# Patient Record
Sex: Male | Born: 1940 | Race: White | Hispanic: No | Smoking: Former smoker
Health system: Southern US, Community
[De-identification: ages and names within clinical notes are randomized; demographics above are authoritative.]

## PROBLEM LIST (undated history)

## (undated) DIAGNOSIS — I10 Essential (primary) hypertension: Secondary | ICD-10-CM

## (undated) DIAGNOSIS — R042 Hemoptysis: Secondary | ICD-10-CM

## (undated) DIAGNOSIS — R0600 Dyspnea, unspecified: Secondary | ICD-10-CM

## (undated) DIAGNOSIS — K219 Gastro-esophageal reflux disease without esophagitis: Secondary | ICD-10-CM

## (undated) DIAGNOSIS — Z8719 Personal history of other diseases of the digestive system: Secondary | ICD-10-CM

## (undated) DIAGNOSIS — C3490 Malignant neoplasm of unspecified part of unspecified bronchus or lung: Secondary | ICD-10-CM

## (undated) DIAGNOSIS — R911 Solitary pulmonary nodule: Secondary | ICD-10-CM

## (undated) DIAGNOSIS — R0902 Hypoxemia: Secondary | ICD-10-CM

## (undated) DIAGNOSIS — T4145XA Adverse effect of unspecified anesthetic, initial encounter: Secondary | ICD-10-CM

## (undated) DIAGNOSIS — T8859XA Other complications of anesthesia, initial encounter: Secondary | ICD-10-CM

## (undated) DIAGNOSIS — C4490 Unspecified malignant neoplasm of skin, unspecified: Secondary | ICD-10-CM

## (undated) DIAGNOSIS — Z9889 Other specified postprocedural states: Secondary | ICD-10-CM

## (undated) DIAGNOSIS — R112 Nausea with vomiting, unspecified: Secondary | ICD-10-CM

## (undated) DIAGNOSIS — C801 Malignant (primary) neoplasm, unspecified: Secondary | ICD-10-CM

## (undated) DIAGNOSIS — J189 Pneumonia, unspecified organism: Secondary | ICD-10-CM

## (undated) DIAGNOSIS — G43109 Migraine with aura, not intractable, without status migrainosus: Secondary | ICD-10-CM

## (undated) DIAGNOSIS — J449 Chronic obstructive pulmonary disease, unspecified: Secondary | ICD-10-CM

## (undated) DIAGNOSIS — A481 Legionnaires' disease: Secondary | ICD-10-CM

## (undated) HISTORY — PX: VASECTOMY: SHX75

## (undated) HISTORY — DX: Migraine with aura, not intractable, without status migrainosus: G43.109

## (undated) HISTORY — PX: TONSILLECTOMY: SUR1361

## (undated) HISTORY — PX: COLONOSCOPY W/ POLYPECTOMY: SHX1380

## (undated) SURGERY — Surgical Case
Anesthesia: *Unknown

---

## 1998-11-15 ENCOUNTER — Other Ambulatory Visit: Admission: RE | Admit: 1998-11-15 | Discharge: 1998-11-15 | Payer: Self-pay | Admitting: Family Medicine

## 1999-02-14 ENCOUNTER — Ambulatory Visit (HOSPITAL_COMMUNITY): Admission: RE | Admit: 1999-02-14 | Discharge: 1999-02-14 | Payer: Self-pay | Admitting: Family Medicine

## 1999-02-14 ENCOUNTER — Encounter: Payer: Self-pay | Admitting: Family Medicine

## 2012-11-27 DIAGNOSIS — A481 Legionnaires' disease: Secondary | ICD-10-CM

## 2012-11-27 HISTORY — DX: Legionnaires' disease: A48.1

## 2013-04-04 ENCOUNTER — Other Ambulatory Visit: Payer: Self-pay

## 2013-04-04 ENCOUNTER — Inpatient Hospital Stay (HOSPITAL_COMMUNITY)
Admission: EM | Admit: 2013-04-04 | Discharge: 2013-04-14 | DRG: 177 | Disposition: A | Discharge status: Home / Self Care | Payer: 59 | Attending: Family Medicine | Admitting: Family Medicine

## 2013-04-04 ENCOUNTER — Inpatient Hospital Stay (HOSPITAL_COMMUNITY): Payer: 59

## 2013-04-04 ENCOUNTER — Encounter (HOSPITAL_COMMUNITY): Payer: Self-pay | Admitting: Emergency Medicine

## 2013-04-04 ENCOUNTER — Emergency Department (HOSPITAL_COMMUNITY): Payer: 59

## 2013-04-04 DIAGNOSIS — E876 Hypokalemia: Secondary | ICD-10-CM

## 2013-04-04 DIAGNOSIS — H109 Unspecified conjunctivitis: Secondary | ICD-10-CM

## 2013-04-04 DIAGNOSIS — I10 Essential (primary) hypertension: Secondary | ICD-10-CM

## 2013-04-04 DIAGNOSIS — N179 Acute kidney failure, unspecified: Secondary | ICD-10-CM

## 2013-04-04 DIAGNOSIS — Z79899 Other long term (current) drug therapy: Secondary | ICD-10-CM

## 2013-04-04 DIAGNOSIS — K219 Gastro-esophageal reflux disease without esophagitis: Secondary | ICD-10-CM | POA: Diagnosis present

## 2013-04-04 DIAGNOSIS — J9600 Acute respiratory failure, unspecified whether with hypoxia or hypercapnia: Secondary | ICD-10-CM

## 2013-04-04 DIAGNOSIS — R0902 Hypoxemia: Secondary | ICD-10-CM

## 2013-04-04 DIAGNOSIS — Z87891 Personal history of nicotine dependence: Secondary | ICD-10-CM

## 2013-04-04 DIAGNOSIS — A481 Legionnaires' disease: Principal | ICD-10-CM | POA: Diagnosis present

## 2013-04-04 DIAGNOSIS — J189 Pneumonia, unspecified organism: Secondary | ICD-10-CM

## 2013-04-04 DIAGNOSIS — D72829 Elevated white blood cell count, unspecified: Secondary | ICD-10-CM

## 2013-04-04 HISTORY — DX: Gastro-esophageal reflux disease without esophagitis: K21.9

## 2013-04-04 HISTORY — DX: Essential (primary) hypertension: I10

## 2013-04-04 LAB — BLOOD GAS, ARTERIAL
Acid-base deficit: 5 mmol/L — ABNORMAL HIGH (ref 0.0–2.0)
Bicarbonate: 17.2 mEq/L — ABNORMAL LOW (ref 20.0–24.0)
Drawn by: 307971
O2 Content: 4.5 L/min
O2 Saturation: 89.4 %
Patient temperature: 98.6
TCO2: 15.5 mmol/L (ref 0–100)
pCO2 arterial: 24.8 mmHg — ABNORMAL LOW (ref 35.0–45.0)
pH, Arterial: 7.455 — ABNORMAL HIGH (ref 7.350–7.450)
pO2, Arterial: 54.8 mmHg — ABNORMAL LOW (ref 80.0–100.0)

## 2013-04-04 LAB — EXPECTORATED SPUTUM ASSESSMENT W GRAM STAIN, RFLX TO RESP C

## 2013-04-04 LAB — COMPREHENSIVE METABOLIC PANEL
ALT: 25 U/L (ref 0–53)
ALT: 29 U/L (ref 0–53)
AST: 50 U/L — ABNORMAL HIGH (ref 0–37)
AST: 59 U/L — ABNORMAL HIGH (ref 0–37)
Albumin: 3.1 g/dL — ABNORMAL LOW (ref 3.5–5.2)
Albumin: 2.6 g/dL — ABNORMAL LOW (ref 3.5–5.2)
Alkaline Phosphatase: 88 U/L (ref 39–117)
Alkaline Phosphatase: 81 U/L (ref 39–117)
BUN: 26 mg/dL — ABNORMAL HIGH (ref 6–23)
BUN: 28 mg/dL — ABNORMAL HIGH (ref 6–23)
CO2: 21 mEq/L (ref 19–32)
CO2: 20 mEq/L (ref 19–32)
Calcium: 8.5 mg/dL (ref 8.4–10.5)
Calcium: 8.1 mg/dL — ABNORMAL LOW (ref 8.4–10.5)
Chloride: 100 mEq/L (ref 96–112)
Chloride: 105 mEq/L (ref 96–112)
Creatinine, Ser: 1.43 mg/dL — ABNORMAL HIGH (ref 0.50–1.35)
Creatinine, Ser: 1.48 mg/dL — ABNORMAL HIGH (ref 0.50–1.35)
GFR calc Af Amer: 55 mL/min — ABNORMAL LOW (ref >90)
GFR calc Af Amer: 53 mL/min — ABNORMAL LOW (ref >90)
GFR calc non Af Amer: 48 mL/min — ABNORMAL LOW (ref >90)
GFR calc non Af Amer: 46 mL/min — ABNORMAL LOW (ref >90)
Glucose, Bld: 138 mg/dL — ABNORMAL HIGH (ref 70–99)
Glucose, Bld: 129 mg/dL — ABNORMAL HIGH (ref 70–99)
Potassium: 3.8 mEq/L (ref 3.5–5.1)
Potassium: 3.2 mEq/L — ABNORMAL LOW (ref 3.5–5.1)
Sodium: 136 mEq/L (ref 135–145)
Sodium: 138 mEq/L (ref 135–145)
Total Bilirubin: 1.1 mg/dL (ref 0.3–1.2)
Total Bilirubin: 0.6 mg/dL (ref 0.3–1.2)
Total Protein: 6.6 g/dL (ref 6.0–8.3)
Total Protein: 5.9 g/dL — ABNORMAL LOW (ref 6.0–8.3)

## 2013-04-04 LAB — CBC
HCT: 33 % — ABNORMAL LOW (ref 39.0–52.0)
Hemoglobin: 11.5 g/dL — ABNORMAL LOW (ref 13.0–17.0)
MCH: 32.6 pg (ref 26.0–34.0)
MCHC: 34.8 g/dL (ref 30.0–36.0)
MCV: 93.5 fL (ref 78.0–100.0)
Platelets: 223 10*3/uL (ref 150–400)
RBC: 3.53 MIL/uL — ABNORMAL LOW (ref 4.22–5.81)
RDW: 13 % (ref 11.5–15.5)
WBC: 13.3 10*3/uL — ABNORMAL HIGH (ref 4.0–10.5)

## 2013-04-04 LAB — URINALYSIS, ROUTINE W REFLEX MICROSCOPIC
Bilirubin Urine: NEGATIVE
Glucose, UA: NEGATIVE mg/dL
Ketones, ur: NEGATIVE mg/dL
Leukocytes, UA: NEGATIVE
Nitrite: NEGATIVE
Protein, ur: 100 mg/dL — AB
Specific Gravity, Urine: 1.023 (ref 1.005–1.030)
Urobilinogen, UA: 1 mg/dL (ref 0.0–1.0)
pH: 5.5 (ref 5.0–8.0)

## 2013-04-04 LAB — CBC WITH DIFFERENTIAL/PLATELET
Basophils Absolute: 0 10*3/uL (ref 0.0–0.1)
Basophils Relative: 0 % (ref 0–1)
Eosinophils Absolute: 0 10*3/uL (ref 0.0–0.7)
Eosinophils Relative: 0 % (ref 0–5)
HCT: 36.4 % — ABNORMAL LOW (ref 39.0–52.0)
Hemoglobin: 12.6 g/dL — ABNORMAL LOW (ref 13.0–17.0)
Lymphocytes Relative: 5 % — ABNORMAL LOW (ref 12–46)
Lymphs Abs: 0.8 10*3/uL (ref 0.7–4.0)
MCH: 32.1 pg (ref 26.0–34.0)
MCHC: 34.6 g/dL (ref 30.0–36.0)
MCV: 92.9 fL (ref 78.0–100.0)
Monocytes Absolute: 1 10*3/uL (ref 0.1–1.0)
Monocytes Relative: 6 % (ref 3–12)
Neutro Abs: 14.5 10*3/uL — ABNORMAL HIGH (ref 1.7–7.7)
Neutrophils Relative %: 89 % — ABNORMAL HIGH (ref 43–77)
Platelets: 235 10*3/uL (ref 150–400)
RBC: 3.92 MIL/uL — ABNORMAL LOW (ref 4.22–5.81)
RDW: 12.8 % (ref 11.5–15.5)
WBC: 16.3 10*3/uL — ABNORMAL HIGH (ref 4.0–10.5)

## 2013-04-04 LAB — URINE MICROSCOPIC-ADD ON

## 2013-04-04 LAB — CG4 I-STAT (LACTIC ACID): Lactic Acid, Venous: 1.96 mmol/L (ref 0.5–2.2)

## 2013-04-04 LAB — MAGNESIUM: Magnesium: 1.9 mg/dL (ref 1.5–2.5)

## 2013-04-04 LAB — MRSA PCR SCREENING: MRSA by PCR: NEGATIVE

## 2013-04-04 LAB — TROPONIN I: Troponin I: <0.3 ng/mL (ref <0.30)

## 2013-04-04 MED ORDER — DEXTROSE 5 % IV SOLN
1.0000 g | Freq: Once | INTRAVENOUS | Status: AC
  Administered 2013-04-04: 1 g via INTRAVENOUS
  Filled 2013-04-04: qty 10

## 2013-04-04 MED ORDER — DEXTROSE 5 % IV SOLN
1.0000 g | INTRAVENOUS | Status: DC
  Administered 2013-04-05 – 2013-04-09 (×5): 1 g via INTRAVENOUS
  Filled 2013-04-04 (×6): qty 10

## 2013-04-04 MED ORDER — AZITHROMYCIN 250 MG PO TABS
500.0000 mg | ORAL_TABLET | Freq: Once | ORAL | Status: AC
  Administered 2013-04-04: 500 mg via ORAL
  Filled 2013-04-04: qty 2

## 2013-04-04 MED ORDER — ALBUTEROL SULFATE (5 MG/ML) 0.5% IN NEBU
2.5000 mg | INHALATION_SOLUTION | Freq: Three times a day (TID) | RESPIRATORY_TRACT | Status: DC
  Administered 2013-04-04: 2.5 mg via RESPIRATORY_TRACT
  Filled 2013-04-04: qty 0.5

## 2013-04-04 MED ORDER — OXYCODONE HCL 5 MG PO TABS
5.0000 mg | ORAL_TABLET | ORAL | Status: DC | PRN
  Administered 2013-04-06 – 2013-04-13 (×15): 5 mg via ORAL
  Filled 2013-04-04 (×16): qty 1

## 2013-04-04 MED ORDER — ONDANSETRON HCL 4 MG/2ML IJ SOLN
4.0000 mg | Freq: Once | INTRAMUSCULAR | Status: AC
  Administered 2013-04-04: 4 mg via INTRAVENOUS
  Filled 2013-04-04: qty 2

## 2013-04-04 MED ORDER — ALBUTEROL SULFATE (5 MG/ML) 0.5% IN NEBU
2.5000 mg | INHALATION_SOLUTION | RESPIRATORY_TRACT | Status: DC
  Administered 2013-04-04 – 2013-04-12 (×46): 2.5 mg via RESPIRATORY_TRACT
  Filled 2013-04-04 (×46): qty 0.5

## 2013-04-04 MED ORDER — FUROSEMIDE 10 MG/ML IJ SOLN
INTRAMUSCULAR | Status: AC
  Filled 2013-04-04: qty 4

## 2013-04-04 MED ORDER — OXYBUTYNIN CHLORIDE ER 10 MG PO TB24
10.0000 mg | ORAL_TABLET | Freq: Every day | ORAL | Status: DC
  Administered 2013-04-04 – 2013-04-14 (×11): 10 mg via ORAL
  Filled 2013-04-04 (×11): qty 1

## 2013-04-04 MED ORDER — IPRATROPIUM BROMIDE 0.02 % IN SOLN
0.5000 mg | RESPIRATORY_TRACT | Status: DC
  Administered 2013-04-04 – 2013-04-12 (×46): 0.5 mg via RESPIRATORY_TRACT
  Filled 2013-04-04 (×46): qty 2.5

## 2013-04-04 MED ORDER — ALBUTEROL SULFATE (5 MG/ML) 0.5% IN NEBU: 2.5000 mg | INHALATION_SOLUTION | RESPIRATORY_TRACT | Status: DC | PRN

## 2013-04-04 MED ORDER — SODIUM CHLORIDE 0.9 % IV SOLN
Freq: Once | INTRAVENOUS | Status: AC
  Administered 2013-04-04: 14:00:00 via INTRAVENOUS

## 2013-04-04 MED ORDER — SENNOSIDES-DOCUSATE SODIUM 8.6-50 MG PO TABS
1.0000 | ORAL_TABLET | Freq: Every evening | ORAL | Status: DC | PRN
  Administered 2013-04-09: 1 via ORAL
  Filled 2013-04-04 (×2): qty 1

## 2013-04-04 MED ORDER — ACETAMINOPHEN 325 MG PO TABS
650.0000 mg | ORAL_TABLET | Freq: Four times a day (QID) | ORAL | Status: DC | PRN
  Administered 2013-04-04: 650 mg via ORAL
  Filled 2013-04-04: qty 2

## 2013-04-04 MED ORDER — ALBUTEROL SULFATE (5 MG/ML) 0.5% IN NEBU
5.0000 mg | INHALATION_SOLUTION | Freq: Once | RESPIRATORY_TRACT | Status: AC
  Administered 2013-04-04: 5 mg via RESPIRATORY_TRACT
  Filled 2013-04-04: qty 1

## 2013-04-04 MED ORDER — DEXTROSE 5 % IV SOLN
500.0000 mg | INTRAVENOUS | Status: DC
  Administered 2013-04-05 – 2013-04-09 (×5): 500 mg via INTRAVENOUS
  Filled 2013-04-04 (×6): qty 500

## 2013-04-04 MED ORDER — ONDANSETRON HCL 4 MG/2ML IJ SOLN
4.0000 mg | Freq: Four times a day (QID) | INTRAMUSCULAR | Status: DC | PRN
  Administered 2013-04-07: 4 mg via INTRAVENOUS
  Filled 2013-04-04: qty 2

## 2013-04-04 MED ORDER — ENOXAPARIN SODIUM 40 MG/0.4ML ~~LOC~~ SOLN
40.0000 mg | SUBCUTANEOUS | Status: DC
  Administered 2013-04-04 – 2013-04-13 (×10): 40 mg via SUBCUTANEOUS
  Filled 2013-04-04 (×15): qty 0.4

## 2013-04-04 MED ORDER — ONDANSETRON HCL 4 MG/2ML IJ SOLN: 4.0000 mg | Freq: Three times a day (TID) | INTRAMUSCULAR | Status: DC | PRN

## 2013-04-04 MED ORDER — FUROSEMIDE 10 MG/ML IJ SOLN
20.0000 mg | Freq: Once | INTRAMUSCULAR | Status: AC
  Administered 2013-04-04: 20 mg via INTRAVENOUS

## 2013-04-04 MED ORDER — SODIUM CHLORIDE 0.9 % IV SOLN: INTRAVENOUS | Status: DC

## 2013-04-04 MED ORDER — SODIUM CHLORIDE 0.9 % IV BOLUS (SEPSIS)
500.0000 mL | Freq: Once | INTRAVENOUS | Status: AC
  Administered 2013-04-04: 500 mL via INTRAVENOUS

## 2013-04-04 MED ORDER — IPRATROPIUM BROMIDE 0.02 % IN SOLN
0.5000 mg | Freq: Once | RESPIRATORY_TRACT | Status: AC
  Administered 2013-04-04: 0.5 mg via RESPIRATORY_TRACT
  Filled 2013-04-04: qty 2.5

## 2013-04-04 MED ORDER — VITAMIN C 500 MG PO TABS
500.0000 mg | ORAL_TABLET | Freq: Every day | ORAL | Status: DC
  Administered 2013-04-04 – 2013-04-14 (×11): 500 mg via ORAL
  Filled 2013-04-04 (×11): qty 1

## 2013-04-04 MED ORDER — ACETAMINOPHEN 650 MG RE SUPP
650.0000 mg | Freq: Four times a day (QID) | RECTAL | Status: DC | PRN
  Filled 2013-04-04: qty 1

## 2013-04-04 MED ORDER — ONDANSETRON HCL 4 MG/2ML IJ SOLN: 4.0000 mg | Freq: Three times a day (TID) | INTRAMUSCULAR | Status: DC

## 2013-04-04 MED ORDER — ACETAMINOPHEN 325 MG PO TABS
650.0000 mg | ORAL_TABLET | Freq: Four times a day (QID) | ORAL | Status: DC | PRN
  Administered 2013-04-05 – 2013-04-13 (×9): 650 mg via ORAL
  Filled 2013-04-04 (×9): qty 2

## 2013-04-04 MED ORDER — PANTOPRAZOLE SODIUM 40 MG PO TBEC
40.0000 mg | DELAYED_RELEASE_TABLET | Freq: Every day | ORAL | Status: DC
  Administered 2013-04-04 – 2013-04-14 (×11): 40 mg via ORAL
  Filled 2013-04-04 (×11): qty 1

## 2013-04-04 MED ORDER — IPRATROPIUM BROMIDE 0.02 % IN SOLN
0.5000 mg | Freq: Three times a day (TID) | RESPIRATORY_TRACT | Status: DC
  Administered 2013-04-04: 0.5 mg via RESPIRATORY_TRACT
  Filled 2013-04-04: qty 2.5

## 2013-04-04 MED ORDER — ONDANSETRON HCL 4 MG PO TABS: 4.0000 mg | ORAL_TABLET | Freq: Four times a day (QID) | ORAL | Status: DC | PRN

## 2013-04-04 NOTE — Progress Notes (Signed)
Pt confirms pcp is Arlan Organ [facility] updated

## 2013-04-04 NOTE — Progress Notes (Signed)
Rapid response notified per Respiratory therapy for increased dyspnea and respiratory rate, even after scheduled nebulizer tx.  Rapid response nurse to floor.  Venti-mask placed on pt.  Sat's increased some, respiratory rate remained in 30's.  Hospitalist on-call notified and saw patient.  CXR ordered STAT.  New orders for transfer to [facility].  Report called to [employer], RN in stepdown and pt transferred per Rapid response RN.  Pt son, Raiford Noble notified of situation and transfer.

## 2013-04-04 NOTE — ED Provider Notes (Addendum)
History     CSN: [id number]  Arrival date & time 04/04/13  1024   First MD Initiated Contact with Patient 04/04/13 1131      Chief Complaint  Patient presents with  . Weakness    (Consider location/radiation/quality/duration/timing/severity/associated sxs/prior treatment) HPI Comments: Patient reports feeling weak, fatigue over the last several days. 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 Patient reports a few days ago while in [place], he did get very weak and lightheaded after standing up, did fall and reports a little bit of soreness in his upper epigastric region which has been improving. He denies any bruising to that area. He reports he has had a mild dry cough, as well as some discomfort on the left flank area. He denies however any urinary frequency, dysuria. He reports he's had some loose stools over last few days. No nausea vomiting, endorses decreased appetite and anorexia. He reports he is a former smoker, has no history of COPD or asthma. He reports he does have some chronic swelling of his lower extremities, not worse than usual, denies any pain behind his knees. He denies any pleuritic chest pain, abdominal pain other than the above-noted epigastric discomfort, no back pain. He denies rash, sore throat, sinus pressure. He has not been taking any specific medications other than his usual prescribed medicines. He denies any recent changes. His primary care physician is Dr. Stacie Acres.    Patient is a 72 y.o. male presenting with weakness. The history is provided by the patient.  Weakness Pertinent negatives include no chest pain, no abdominal pain and no shortness of breath.    Past Medical History  Diagnosis Date  . Hypertension   . GERD (gastroesophageal reflux disease)     History reviewed. No pertinent past surgical history.  History reviewed. No pertinent family history.  History  Substance Use  Topics  . Smoking status: Former Games developer  . Smokeless tobacco: Not on file  . Alcohol Use: No      Review of Systems  Constitutional: Positive for chills, appetite change and fatigue. Negative for fever.  HENT: Negative for neck pain and neck stiffness.   Respiratory: Positive for cough. Negative for shortness of breath and wheezing.   Cardiovascular: Negative for chest pain.  Gastrointestinal: Positive for diarrhea. Negative for nausea, vomiting, abdominal pain and blood in stool.  Genitourinary: Positive for flank pain. Negative for dysuria.  Skin: Negative for rash and wound.  Neurological: Positive for weakness and light-headedness. Negative for syncope.  All other systems reviewed and are negative.    Allergies  Review of patient's allergies indicates no known allergies.  Home Medications   Current Outpatient Rx  Name  Route  Sig  Dispense  Refill  . amLODipine (NORVASC) 10 MG tablet   Oral   Take 10 mg by mouth daily.         Marland Kitchen atenolol (TENORMIN) 25 MG tablet   Oral   Take 25 mg by mouth daily.         . benazepril (LOTENSIN) 20 MG tablet   Oral   Take 20 mg by mouth daily.         Marland Kitchen losartan (COZAAR) 100 MG tablet   Oral   Take 100 mg by mouth daily.         Marland Kitchen omeprazole (PRILOSEC) 40 MG capsule   Oral   Take 40 mg by mouth 2 (two) times daily.         Marland Kitchen  oxybutynin (DITROPAN-XL) 10 MG 24 hr tablet   Oral   Take 10 mg by mouth daily.         . vitamin C (ASCORBIC ACID) 500 MG tablet   Oral   Take 500 mg by mouth daily.           BP 128/46  Pulse 89  Temp(Src) 101.3 F (38.5 C) (Oral)  Resp 22  SpO2 93%  Physical Exam  Nursing note and vitals reviewed. Constitutional: He is oriented to person, place, and time. He appears well-developed and well-nourished. No distress.  HENT:  Head: Normocephalic and atraumatic.  Eyes: EOM are normal. No scleral icterus.  Neck: Normal range of motion. Neck supple. No JVD present.   Cardiovascular: Normal rate, regular rhythm and intact distal pulses.   No murmur heard. Pulmonary/Chest: No stridor. Tachypnea noted. No respiratory distress. He has no wheezes.  Abdominal: Soft. He exhibits no distension. There is no tenderness. There is no rebound and no guarding.  Musculoskeletal: He exhibits no edema and no tenderness.  Neurological: He is alert and oriented to person, place, and time.  Skin: Skin is warm and dry. No rash noted. He is not diaphoretic.  Psychiatric: He has a normal mood and affect.    ED Course  Procedures (including critical care time)  Labs Reviewed  CBC WITH DIFFERENTIAL - Abnormal; Notable for the following:    WBC 16.3 (*)    RBC 3.92 (*)    Hemoglobin 12.6 (*)    HCT 36.4 (*)    Neutrophils Relative 89 (*)    Neutro Abs 14.5 (*)    Lymphocytes Relative 5 (*)    All other components within normal limits  COMPREHENSIVE METABOLIC PANEL - Abnormal; Notable for the following:    Glucose, Bld 138 (*)    BUN 26 (*)    Creatinine, Ser 1.43 (*)    Albumin 3.1 (*)    AST 50 (*)    GFR calc non Af Amer 48 (*)    GFR calc Af Amer 55 (*)    All other components within normal limits  URINE CULTURE  CULTURE, BLOOD (ROUTINE X 2)  CULTURE, BLOOD (ROUTINE X 2)  URINALYSIS, ROUTINE W REFLEX MICROSCOPIC  CG4 I-STAT (LACTIC ACID)   Dg Chest 2 View  04/04/2013  *RADIOLOGY REPORT*  Clinical Data: Fever, weakness.  CHEST - 2 VIEW  Comparison: None.  Findings: Cardiomediastinal silhouette appears normal.  Large alveolar opacity is seen laterally in the left upper lobe consistent with pneumonia.  Right lung is clear.  No pleural effusion or pneumothorax is noted.  IMPRESSION: Large left upper lobe pneumonia.   Original Report Authenticated By: Lupita Raider.,  M.D.    I reviewed the above CXR myself.    1. Community acquired pneumonia   2. Hypoxemia    ECG at time 11:14 shows SR at rate 90, LAFB, no ST or T Wave abn's.  Abn ECG.  No priors.  Room  her saturations were 90% which is abnormal low. O2 sats are improved to the mid 90s after placement of nasal cannula oxygen.   3:04 PM Pt has been seen by RT due to order for albuterol.  Pt's sats are continuing to drop despite [place] O2 and now is in the upper 80's.  I have ordered an ABG and ordered more nebs.  Will consider BiPAP and will have RN contact Dr. Ardyth Harps.  MDM   Patient with mild hypoxia here and evaluation, no history  of asthma or COPD. Given fever of 101, fatigue, malaise and anorexia, I'm concerned for possible sepsis. Sepsis evaluation has been initiated. Blood cultures, UA urinalysis and lactic acid have been ordered. Patient's white count is elevated and chest x-ray eventually has revealed a large left upper lobe infiltrate. No calf tenderness, don't suspect DVT or PE. Plan is to start IV and oral antibiotics for community acquired pneumonia, provide breathing nebulizer treatments. Given hypoxic state, age, I would recommend inpatient admission for treatment of pneumonia.        Gavin Pound. Oletta Lamas, MD 04/04/13 1307  Gavin Pound. Oletta Lamas, MD 04/04/13 1313  Gavin Pound. Oletta Lamas, MD 04/04/13 1505  Gavin Pound. Dickson Kostelnik, MD 04/04/13 4098

## 2013-04-04 NOTE — Progress Notes (Signed)
Patient seen for scheduled nebulizer treatment. Mild to moderate respiratory distress noted with desaturations and tachypnea. Auscultation reveals wheezing with rhonchi and scattered crackles. Patient is not able to speak in complete sentences. Spoke with RN and Rapid Response Nurse notified for possible transfer and/or BiPAP.

## 2013-04-04 NOTE — ED Notes (Signed)
Urinal has been given to patient. Advised him that we do need a urine specimen.

## 2013-04-04 NOTE — Progress Notes (Signed)
72 yo with LUL PNA, received 500 cc NS bolus in the ED at 1300 and was on 75 cc NS; CXR with worsening infiltrate possible interstitial edema.   Plan: 20 mg IV lasix; could consider BiPAP for worsening respiratory distress.

## 2013-04-04 NOTE — Progress Notes (Signed)
UR completed 

## 2013-04-04 NOTE — ED Notes (Signed)
Patient's manager Eda Keys) called and requested patient's son to call when able -- [phone] and [phone].

## 2013-04-04 NOTE — ED Notes (Signed)
Per patient, feeling weak/fatigued-takes awhile to get motivated-SOB with exertion

## 2013-04-04 NOTE — Progress Notes (Signed)
Subjective: Interval History: worsening dyspnea/hypoxia  Objective: Vital signs in last 24 hours: Temp:  [98.6 F (37 C)-101.3 F (38.5 C)] 98.6 F (37 C) (05/09 1756) Pulse Rate:  [89-92] 92 (05/09 1756) Resp:  [16-22] 16 (05/09 1756) BP: (101-128)/(43-46) 105/43 mmHg (05/09 1756) SpO2:  [89 %-94 %] 91 % (05/09 1756) Weight:  [84.1 kg (185 lb 6.5 oz)] 84.1 kg (185 lb 6.5 oz) (05/09 1756)  Intake/Output from previous day:   Intake/Output this shift:    BP 105/43  Pulse 92  Temp(Src) 98.6 F (37 C) (Oral)  Resp 16  Ht 5\' 8"  (1.727 m)  Wt 84.1 kg (185 lb 6.5 oz)  BMI 28.2 kg/m2  SpO2 91% General appearance: alert, cooperative and moderate distress Lungs: rales LUL and coarse sounds lower lobe, clear and diminished on the right Heart: regular rate and rhythm  Results for orders placed during the hospital encounter of 04/04/13 (from the past 24 hour(s))  CBC WITH DIFFERENTIAL     Status: Abnormal   Collection Time    04/04/13 11:50 AM      Result Value Range   WBC 16.3 (*) 4.0 - 10.5 K/uL   RBC 3.92 (*) 4.22 - 5.81 MIL/uL   Hemoglobin 12.6 (*) 13.0 - 17.0 g/dL   HCT 40.9 (*) 81.1 - 91.4 %   MCV 92.9  78.0 - 100.0 fL   MCH 32.1  26.0 - 34.0 pg   MCHC 34.6  30.0 - 36.0 g/dL   RDW 78.2  95.6 - 21.3 %   Platelets 235  150 - 400 K/uL   Neutrophils Relative 89 (*) 43 - 77 %   Neutro Abs 14.5 (*) 1.7 - 7.7 K/uL   Lymphocytes Relative 5 (*) 12 - 46 %   Lymphs Abs 0.8  0.7 - 4.0 K/uL   Monocytes Relative 6  3 - 12 %   Monocytes Absolute 1.0  0.1 - 1.0 K/uL   Eosinophils Relative 0  0 - 5 %   Eosinophils Absolute 0.0  0.0 - 0.7 K/uL   Basophils Relative 0  0 - 1 %   Basophils Absolute 0.0  0.0 - 0.1 K/uL  COMPREHENSIVE METABOLIC PANEL     Status: Abnormal   Collection Time    04/04/13 11:50 AM      Result Value Range   Sodium 136  135 - 145 mEq/L   Potassium 3.8  3.5 - 5.1 mEq/L   Chloride 100  96 - 112 mEq/L   CO2 21  19 - 32 mEq/L   Glucose, Bld 138 (*) 70 - 99  mg/dL   BUN 26 (*) 6 - 23 mg/dL   Creatinine, Ser 0.86 (*) 0.50 - 1.35 mg/dL   Calcium 8.5  8.4 - 57.8 mg/dL   Total Protein 6.6  6.0 - 8.3 g/dL   Albumin 3.1 (*) 3.5 - 5.2 g/dL   AST 50 (*) 0 - 37 U/L   ALT 25  0 - 53 U/L   Alkaline Phosphatase 88  39 - 117 U/L   Total Bilirubin 1.1  0.3 - 1.2 mg/dL   GFR calc non Af Amer 48 (*) >90 mL/min   GFR calc Af Amer 55 (*) >90 mL/min  CG4 I-STAT (LACTIC ACID)     Status: None   Collection Time    04/04/13 12:12 PM      Result Value Range   Lactic Acid, Venous 1.96  0.5 - 2.2 mmol/L  URINALYSIS, ROUTINE W REFLEX MICROSCOPIC  Status: Abnormal   Collection Time    04/04/13  1:31 PM      Result Value Range   Color, Urine AMBER (*) YELLOW   APPearance CLOUDY (*) CLEAR   Specific Gravity, Urine 1.023  1.005 - 1.030   pH 5.5  5.0 - 8.0   Glucose, UA NEGATIVE  NEGATIVE mg/dL   Hgb urine dipstick LARGE (*) NEGATIVE   Bilirubin Urine NEGATIVE  NEGATIVE   Ketones, ur NEGATIVE  NEGATIVE mg/dL   Protein, ur 213 (*) NEGATIVE mg/dL   Urobilinogen, UA 1.0  0.0 - 1.0 mg/dL   Nitrite NEGATIVE  NEGATIVE   Leukocytes, UA NEGATIVE  NEGATIVE  URINE MICROSCOPIC-ADD ON     Status: Abnormal   Collection Time    04/04/13  1:31 PM      Result Value Range   RBC / HPF 0-2  <3 RBC/hpf   Bacteria, UA FEW (*) RARE   Casts GRANULAR CAST (*) NEGATIVE   Urine-Other MUCOUS PRESENT    BLOOD GAS, ARTERIAL     Status: Abnormal   Collection Time    04/04/13  3:22 PM      Result Value Range   O2 Content 4.5     Delivery systems NASAL CANNULA     pH, Arterial 7.455 (*) 7.350 - 7.450   pCO2 arterial 24.8 (*) 35.0 - 45.0 mmHg   pO2, Arterial 54.8 (*) 80.0 - 100.0 mmHg   Bicarbonate 17.2 (*) 20.0 - 24.0 mEq/L   TCO2 15.5  0 - 100 mmol/L   Acid-base deficit 5.0 (*) 0.0 - 2.0 mmol/L   O2 Saturation 89.4     Patient temperature 98.6     Collection site REVIEWED BY     Drawn by [id number]     Sample type ARTERIAL DRAW    CULTURE, EXPECTORATED SPUTUM-ASSESSMENT      Status: None   Collection Time    04/04/13  7:33 PM      Result Value Range   Specimen Description SPUTUM     Special Requests NONE     Sputum evaluation       Value: THIS SPECIMEN IS ACCEPTABLE. RESPIRATORY CULTURE REPORT TO FOLLOW.   Report Status 04/04/2013 FINAL      Studies/Results: Dg Chest 2 View  04/04/2013  *RADIOLOGY REPORT*  Clinical Data: Fever, weakness.  CHEST - 2 VIEW  Comparison: None.  Findings: Cardiomediastinal silhouette appears normal.  Large alveolar opacity is seen laterally in the left upper lobe consistent with pneumonia.  Right lung is clear.  No pleural effusion or pneumothorax is noted.  IMPRESSION: Large left upper lobe pneumonia.   Original Report Authenticated By: Lupita Raider.,  M.D.     Scheduled Meds: . albuterol  2.5 mg Nebulization TID  . [START ON 04/05/2013] azithromycin  500 mg Intravenous Q24H  . [START ON 04/05/2013] cefTRIAXone (ROCEPHIN)  IV  1 g Intravenous Q24H  . enoxaparin (LOVENOX) injection  40 mg Subcutaneous Q24H  . ipratropium  0.5 mg Nebulization TID  . oxybutynin  10 mg Oral Daily  . pantoprazole  40 mg Oral Daily  . vitamin C  500 mg Oral Daily   Continuous Infusions: . sodium chloride 75 mL/hr at 04/04/13 1755   PRN Meds:acetaminophen, acetaminophen, ondansetron (ZOFRAN) IV, ondansetron, oxyCODONE, senna-docusate  Assessment/Plan:  Left lung pneumonia - increased work of breathing with increased oxygen requirements. Transfer to stepdown closer monitoring. Have contacted Elink for consult. Repeat cxray unchanged. Will decrease IVF and per CCM give  Lasix 20 mg IV. Continue Venti Mask for now -consider Bipap if needed.   LOS: 0 days   Leighton Brickley A.

## 2013-04-04 NOTE — Progress Notes (Signed)
Called to room 3 East per Resp Therapist Tabitha at 2000 for pt in worsening SOB and increased work of breathing. Upon my arrival pt found resting in the bed in mild distress. Pt alert oriented denies pain. Complains of difficulty breathing,  "its hard to breath sometimes" Pt placed on 02 sat probe and RRT monitor with 02 sats yielding 87-89 on 6 LNC. Venti mask obtained with sats improving to 93-95. Pt RR 30-40s. Lung sounds congested bilateral upper lobes, diminished on left side.  Hr SR 100s. BP 124/56 initially. CXR ordered stat, and Daphane Shepherd NP called per floor RN to assess pt. While waiting for xray, Daphane Shepherd at bedside , orders received to transfer pt to [facility] for possible bipap. Pt prepared for transfer and moved to room 1235 at 2115. Report given per Revonda Standard RN to [place] RN in [place]. See flow sheet for VS.

## 2013-04-04 NOTE — H&P (Signed)
[facility]          History and Physical    PCP:   Thora Lance, MD   Chief Complaint:  Weakness, cough, muscle aches  HPI: Patient is a pleasant 72 year old white man with history only significant for hypertension. 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 At that point he also felt a little dizzy. He continued to feel worse and progressed to have body aches. He had subjective fevers and chills however did not actually take his temperature. He decided to come to the hospital today because of progressive cough and weakness. He is found to have a large left pneumonia. He is febrile to 101.3. We have been asked to admit him for further evaluation and management.  Allergies:  No Known Allergies    Past Medical History  Diagnosis Date  . Hypertension   . GERD (gastroesophageal reflux disease)     Past Surgical History  Procedure Laterality Date  . Tonsillectomy      Prior to Admission medications   Medication Sig Start Date End Date Taking? Authorizing Provider  amLODipine (NORVASC) 10 MG tablet Take 10 mg by mouth daily.   Yes Historical Provider, MD  atenolol (TENORMIN) 25 MG tablet Take 25 mg by mouth daily.   Yes Historical Provider, MD  benazepril (LOTENSIN) 20 MG tablet Take 20 mg by mouth daily.   Yes Historical Provider, MD  losartan (COZAAR) 100 MG tablet Take 100 mg by mouth daily.   Yes Historical Provider, MD  omeprazole (PRILOSEC) 40 MG capsule Take 40 mg by mouth 2 (two) times daily.   Yes Historical Provider, MD  oxybutynin (DITROPAN-XL) 10 MG 24 hr tablet Take 10 mg by mouth daily.   Yes Historical Provider, MD  vitamin C (ASCORBIC ACID) 500 MG tablet Take 500 mg by mouth daily.   Yes Historical Provider, MD    Social History:  reports that he has quit smoking. His smoking use included Cigarettes. He smoked 0.00 packs per day. He has never used smokeless tobacco. He reports  that he does not drink alcohol or use illicit drugs.  History reviewed. No pertinent family history.  Review of Systems:  Constitutional: Positive for fever, chills, diaphoresis, appetite change and fatigue.  HEENT: Denies photophobia, eye pain, redness, hearing loss, ear pain, congestion, sore throat, rhinorrhea, sneezing, mouth sores, trouble swallowing, neck pain, neck stiffness and tinnitus.   Respiratory: Denies  chest tightness. Cardiovascular: Denies chest pain, palpitations and leg swelling.  Gastrointestinal: Denies nausea, vomiting, abdominal pain, diarrhea, constipation, blood in stool and abdominal distention.  Genitourinary: Denies dysuria, urgency, frequency, hematuria, flank pain and difficulty urinating.  Endocrine: Denies: hot or cold intolerance, sweats, changes in hair or nails, polyuria, polydipsia. Musculoskeletal: Denies myalgias, back pain, joint swelling, arthralgias and gait problem.  Skin: Denies pallor, rash and wound.  Neurological: Denies dizziness, seizures, syncope, weakness, light-headedness, numbness and headaches.  Hematological: Denies adenopathy. Easy bruising, personal or family bleeding history  Psychiatric/Behavioral: Denies suicidal ideation, mood changes, confusion, nervousness, sleep disturbance and agitation   Physical Exam: Blood pressure 101/43, pulse 89, temperature 100.3 F (37.9 C), temperature source Oral, resp. rate 22, SpO2 90.00%. General: Alert, awake, oriented x3. HEENT: Normocephalic, atraumatic, pupils equal round and reactive to light, extraocular movements intact, wears corrective lenses. Neck: Supple, no JVD, lymphadenopathy, or bruits, no goiter. Cardiovascular: Regular rate and rhythm, no murmurs, rubs or gallops. Lungs: Rhonchi more prominent on the left  upper lobe. Abdomen: Soft, nontender, nondistended, positive bowel sounds, no masses or organomegaly noted. Extremities: No clubbing, cyanosis or edema, positive pedal  pulses. Neurologic: Grossly intact and nonfocal.  Labs on Admission:  Results for orders placed during the hospital encounter of 04/04/13 (from the past 48 hour(s))  CBC WITH DIFFERENTIAL     Status: Abnormal   Collection Time    04/04/13 11:50 AM      Result Value Range   WBC 16.3 (*) 4.0 - 10.5 K/uL   RBC 3.92 (*) 4.22 - 5.81 MIL/uL   Hemoglobin 12.6 (*) 13.0 - 17.0 g/dL   HCT 16.1 (*) 09.6 - 04.5 %   MCV 92.9  78.0 - 100.0 fL   MCH 32.1  26.0 - 34.0 pg   MCHC 34.6  30.0 - 36.0 g/dL   RDW 40.9  81.1 - 91.4 %   Platelets 235  150 - 400 K/uL   Neutrophils Relative 89 (*) 43 - 77 %   Neutro Abs 14.5 (*) 1.7 - 7.7 K/uL   Lymphocytes Relative 5 (*) 12 - 46 %   Lymphs Abs 0.8  0.7 - 4.0 K/uL   Monocytes Relative 6  3 - 12 %   Monocytes Absolute 1.0  0.1 - 1.0 K/uL   Eosinophils Relative 0  0 - 5 %   Eosinophils Absolute 0.0  0.0 - 0.7 K/uL   Basophils Relative 0  0 - 1 %   Basophils Absolute 0.0  0.0 - 0.1 K/uL  COMPREHENSIVE METABOLIC PANEL     Status: Abnormal   Collection Time    04/04/13 11:50 AM      Result Value Range   Sodium 136  135 - 145 mEq/L   Potassium 3.8  3.5 - 5.1 mEq/L   Chloride 100  96 - 112 mEq/L   CO2 21  19 - 32 mEq/L   Glucose, Bld 138 (*) 70 - 99 mg/dL   BUN 26 (*) 6 - 23 mg/dL   Creatinine, Ser 7.82 (*) 0.50 - 1.35 mg/dL   Calcium 8.5  8.4 - 95.6 mg/dL   Total Protein 6.6  6.0 - 8.3 g/dL   Albumin 3.1 (*) 3.5 - 5.2 g/dL   AST 50 (*) 0 - 37 U/L   ALT 25  0 - 53 U/L   Alkaline Phosphatase 88  39 - 117 U/L   Total Bilirubin 1.1  0.3 - 1.2 mg/dL   GFR calc non Af Amer 48 (*) >90 mL/min   GFR calc Af Amer 55 (*) >90 mL/min   Comment:            The eGFR has been calculated     using the CKD EPI equation.     This calculation has not been     validated in all clinical     situations.     eGFR's persistently     <90 mL/min signify     possible Chronic Kidney Disease.  CG4 I-STAT (LACTIC ACID)     Status: None   Collection Time    04/04/13 12:12  PM      Result Value Range   Lactic Acid, Venous 1.96  0.5 - 2.2 mmol/L  URINALYSIS, ROUTINE W REFLEX MICROSCOPIC     Status: Abnormal   Collection Time    04/04/13  1:31 PM      Result Value Range   Color, Urine AMBER (*) YELLOW   Comment: BIOCHEMICALS MAY BE AFFECTED BY COLOR   APPearance  CLOUDY (*) CLEAR   Specific Gravity, Urine 1.023  1.005 - 1.030   pH 5.5  5.0 - 8.0   Glucose, UA NEGATIVE  NEGATIVE mg/dL   Hgb urine dipstick LARGE (*) NEGATIVE   Bilirubin Urine NEGATIVE  NEGATIVE   Ketones, ur NEGATIVE  NEGATIVE mg/dL   Protein, ur 865 (*) NEGATIVE mg/dL   Urobilinogen, UA 1.0  0.0 - 1.0 mg/dL   Nitrite NEGATIVE  NEGATIVE   Leukocytes, UA NEGATIVE  NEGATIVE  URINE MICROSCOPIC-ADD ON     Status: Abnormal   Collection Time    04/04/13  1:31 PM      Result Value Range   RBC / HPF 0-2  <3 RBC/hpf   Bacteria, UA FEW (*) RARE   Casts GRANULAR CAST (*) NEGATIVE   Urine-Other MUCOUS PRESENT    BLOOD GAS, ARTERIAL     Status: Abnormal   Collection Time    04/04/13  3:22 PM      Result Value Range   O2 Content 4.5     Delivery systems NASAL CANNULA     pH, Arterial 7.455 (*) 7.350 - 7.450   pCO2 arterial 24.8 (*) 35.0 - 45.0 mmHg   pO2, Arterial 54.8 (*) 80.0 - 100.0 mmHg   Bicarbonate 17.2 (*) 20.0 - 24.0 mEq/L   TCO2 15.5  0 - 100 mmol/L   Acid-base deficit 5.0 (*) 0.0 - 2.0 mmol/L   O2 Saturation 89.4     Patient temperature 98.6     Collection site REVIEWED BY     Drawn by [id number]     Sample type ARTERIAL DRAW      Radiological Exams on Admission: Dg Chest 2 View  04/04/2013  *RADIOLOGY REPORT*  Clinical Data: Fever, weakness.  CHEST - 2 VIEW  Comparison: None.  Findings: Cardiomediastinal silhouette appears normal.  Large alveolar opacity is seen laterally in the left upper lobe consistent with pneumonia.  Right lung is clear.  No pleural effusion or pneumothorax is noted.  IMPRESSION: Large left upper lobe pneumonia.   Original Report Authenticated By: Lupita Raider.,  M.D.     Assessment/Plan Active Problems:   CAP (community acquired pneumonia)   Leukocytosis, unspecified   HTN (hypertension)   Community-acquired pneumonia -Admit to MedSurg, start on Rocephin and azithromycin. -Panculture including blood and sputum. -Check for strep pneumo and Legionella urinary antigens. -He is a bit hypoxic was 89% on room air, this is up to 90-94% on 4 L.  Hypertension -Will hold BP meds for now as his systolic blood pressure is only 100.  DVT prophylaxis -Lovenox.  Time Spent on Admission: 75 minutes  HERNANDEZ ACOSTA,Faron Whitelock [facility] Pager: [phone] 04/04/2013, 5:37 PM

## 2013-04-05 DIAGNOSIS — N179 Acute kidney failure, unspecified: Secondary | ICD-10-CM

## 2013-04-05 LAB — BASIC METABOLIC PANEL
BUN: 26 mg/dL — ABNORMAL HIGH (ref 6–23)
CO2: 21 mEq/L (ref 19–32)
Calcium: 7.9 mg/dL — ABNORMAL LOW (ref 8.4–10.5)
Chloride: 102 mEq/L (ref 96–112)
Creatinine, Ser: 1.42 mg/dL — ABNORMAL HIGH (ref 0.50–1.35)
GFR calc Af Amer: 56 mL/min — ABNORMAL LOW (ref >90)
GFR calc non Af Amer: 48 mL/min — ABNORMAL LOW (ref >90)
Glucose, Bld: 110 mg/dL — ABNORMAL HIGH (ref 70–99)
Potassium: 3.1 mEq/L — ABNORMAL LOW (ref 3.5–5.1)
Sodium: 135 mEq/L (ref 135–145)

## 2013-04-05 LAB — CBC
HCT: 33.2 % — ABNORMAL LOW (ref 39.0–52.0)
Hemoglobin: 11.6 g/dL — ABNORMAL LOW (ref 13.0–17.0)
MCH: 32.6 pg (ref 26.0–34.0)
MCHC: 34.9 g/dL (ref 30.0–36.0)
MCV: 93.3 fL (ref 78.0–100.0)
Platelets: 217 10*3/uL (ref 150–400)
RBC: 3.56 MIL/uL — ABNORMAL LOW (ref 4.22–5.81)
RDW: 12.9 % (ref 11.5–15.5)
WBC: 12.2 10*3/uL — ABNORMAL HIGH (ref 4.0–10.5)

## 2013-04-05 LAB — BLOOD GAS, ARTERIAL
Acid-base deficit: 3.2 mmol/L — ABNORMAL HIGH (ref 0.0–2.0)
Bicarbonate: 19.2 mEq/L — ABNORMAL LOW (ref 20.0–24.0)
Drawn by: 365291
FIO2: 0.5 %
O2 Saturation: 92.9 %
Patient temperature: 98.6
TCO2: 17.4 mmol/L (ref 0–100)
pCO2 arterial: 27.4 mmHg — ABNORMAL LOW (ref 35.0–45.0)
pH, Arterial: 7.46 — ABNORMAL HIGH (ref 7.350–7.450)
pO2, Arterial: 60.7 mmHg — ABNORMAL LOW (ref 80.0–100.0)

## 2013-04-05 LAB — STREP PNEUMONIAE URINARY ANTIGEN: Strep Pneumo Urinary Antigen: NEGATIVE

## 2013-04-05 LAB — TROPONIN I
Troponin I: <0.3 ng/mL (ref <0.30)
Troponin I: <0.3 ng/mL (ref <0.30)

## 2013-04-05 LAB — INFLUENZA PANEL BY PCR (TYPE A & B)
H1N1 flu by pcr: NOT DETECTED
Influenza A By PCR: NEGATIVE
Influenza B By PCR: NEGATIVE

## 2013-04-05 LAB — HIV ANTIBODY (ROUTINE TESTING W REFLEX): HIV: NONREACTIVE

## 2013-04-05 MED ORDER — CHLORHEXIDINE GLUCONATE 0.12 % MT SOLN
15.0000 mL | Freq: Two times a day (BID) | OROMUCOSAL | Status: DC
  Administered 2013-04-05 – 2013-04-14 (×16): 15 mL via OROMUCOSAL
  Filled 2013-04-05 (×18): qty 15

## 2013-04-05 MED ORDER — BIOTENE DRY MOUTH MT LIQD
15.0000 mL | Freq: Two times a day (BID) | OROMUCOSAL | Status: DC
  Administered 2013-04-06 – 2013-04-13 (×14): 15 mL via OROMUCOSAL

## 2013-04-05 MED ORDER — POTASSIUM CHLORIDE CRYS ER 20 MEQ PO TBCR
30.0000 meq | EXTENDED_RELEASE_TABLET | ORAL | Status: AC
  Administered 2013-04-05 (×2): 30 meq via ORAL
  Filled 2013-04-05 (×2): qty 1

## 2013-04-05 NOTE — Progress Notes (Signed)
CRITICAL VALUE ALERT  Critical value received:  Urine + for legionella pneumophilia [facility]  Date of notification:  04/05/2013   Time of notification:  1620  Critical value read back:yes  Nurse who received alert:  Everette Rank, RN  MD notified (1st page):  Penne Lash, MD  Time of first page:  1622  MD notified (2nd page):  Time of second page:  Responding MD:  Penne Lash, MD  Time MD responded:  1630  MD aware - current abx continued. No new orders.

## 2013-04-05 NOTE — Progress Notes (Signed)
[facility] PROGRESS NOTE  Robert Dixon ZOX:[id number] DOB: 03-03-1941 DOA: 04/04/2013 PCP: Thora Lance, MD  Assessment/Plan: Community acquired pneumonia -Required transfer to SDU overnight secondary to increased work of breathing and hypoxemia. Was placed on noninvasive positive pressure ventilation overnight. -Will give orders to start weaning BiPAP this morning. -Continue Rocephin and azithromycin. -All culture data is negative to date. -Influenza PCR is pending as well.  Hypertension -All BP meds are on hold for now.  Acute renal failure -Creatinine has maintained around 1.4 since admission. -I question whether he may have some chronic kidney disease secondary to long-standing hypertension.  Code Status: Full code Family Communication: Patient only  Disposition Plan: Keep in step down unit today in case he needs BiPAP again.   Consultants:  None   Antibiotics:  Rocephin day 2  Azithromycin day 2   Subjective: BiPAP is on. He is hungry. Is alert and talkative.  Objective: Filed Vitals:   04/05/13 0800 04/05/13 0805 04/05/13 0835 04/05/13 0841  BP:      Pulse:   109 108  Temp: 101.6 F (38.7 C)     TempSrc: Axillary     Resp:   25 28  Height:      Weight:      SpO2:  89% 90% 92%    Intake/Output Summary (Last 24 hours) at 04/05/13 0939 Last data filed at 04/05/13 0800  Gross per 24 hour  Intake 371.25 ml  Output    575 ml  Net -203.75 ml   Filed Weights   04/04/13 1756 04/04/13 2105  Weight: 84.1 kg (185 lb 6.5 oz) 85.1 kg (187 lb 9.8 oz)    Exam:   General:  Alert, awake, oriented x3.  Cardiovascular: Regular rate and rhythm, no murmurs, rubs gallops.  Respiratory: Rhonchi and decreased breath sounds left upper lobe  Abdomen: Soft, nontender, nondistended, positive bowel sounds, no masses or organomegaly noted  Extremities: No clubbing, cyanosis or edema   Neurologic:  Grossly intact and nonfocal.  Data Reviewed: Basic  Metabolic Panel:  Recent Labs Lab 04/04/13 1150 04/04/13 2112 04/05/13 0356  NA 136 138 135  K 3.8 3.2* 3.1*  CL 100 105 102  CO2 21 20 21   GLUCOSE 138* 129* 110*  BUN 26* 28* 26*  CREATININE 1.43* 1.48* 1.42*  CALCIUM 8.5 8.1* 7.9*  MG  --  1.9  --    Liver Function Tests:  Recent Labs Lab 04/04/13 1150 04/04/13 2112  AST 50* 59*  ALT 25 29  ALKPHOS 88 81  BILITOT 1.1 0.6  PROT 6.6 5.9*  ALBUMIN 3.1* 2.6*   No results found for this basename: LIPASE, AMYLASE,  in the last 168 hours No results found for this basename: AMMONIA,  in the last 168 hours CBC:  Recent Labs Lab 04/04/13 1150 04/04/13 2112 04/05/13 0356  WBC 16.3* 13.3* 12.2*  NEUTROABS 14.5*  --   --   HGB 12.6* 11.5* 11.6*  HCT 36.4* 33.0* 33.2*  MCV 92.9 93.5 93.3  PLT 235 223 217   Cardiac Enzymes:  Recent Labs Lab 04/04/13 2112 04/05/13 0356  TROPONINI <0.30 <0.30   BNP (last 3 results) No results found for this basename: PROBNP,  in the last 8760 hours CBG: No results found for this basename: GLUCAP,  in the last 168 hours  Recent Results (from the past 240 hour(s))  CULTURE, EXPECTORATED SPUTUM-ASSESSMENT     Status: None   Collection Time    04/04/13  7:33 PM  Result Value Range Status   Specimen Description SPUTUM   Final   Special Requests NONE   Final   Sputum evaluation     Final   Value: THIS SPECIMEN IS ACCEPTABLE. RESPIRATORY CULTURE REPORT TO FOLLOW.   Report Status 04/04/2013 FINAL   Final  MRSA PCR SCREENING     Status: None   Collection Time    04/04/13  9:40 PM      Result Value Range Status   MRSA by PCR NEGATIVE  NEGATIVE Final   Comment:            The GeneXpert MRSA Assay (FDA     approved for NASAL specimens     only), is one component of a     comprehensive MRSA colonization     surveillance program. It is not     intended to diagnose MRSA     infection nor to guide or     monitor treatment for     MRSA infections.     Studies: Dg Chest 2  View  04/04/2013  *RADIOLOGY REPORT*  Clinical Data: Fever, weakness.  CHEST - 2 VIEW  Comparison: None.  Findings: Cardiomediastinal silhouette appears normal.  Large alveolar opacity is seen laterally in the left upper lobe consistent with pneumonia.  Right lung is clear.  No pleural effusion or pneumothorax is noted.  IMPRESSION: Large left upper lobe pneumonia.   Original Report Authenticated By: Lupita Raider.,  M.D.    Dg Chest Port 1 View  04/04/2013  *RADIOLOGY REPORT*  Clinical Data: Shortness of breath, decreased oxygen saturation  PORTABLE CHEST - 1 VIEW  Comparison: Portable exam 2053 hours compared to 1222 hours  Findings: Normal heart size and pulmonary vascularity. Slightly prominent right paratracheal soft tissues unchanged. Atherosclerotic calcification aorta. Extensive airspace consolidation left lung consistent with pneumonia. Mild atelectasis right base. No gross pleural effusion or pneumothorax. Bones demineralized.  IMPRESSION: Extensive left lung pneumonia, unchanged.   Original Report Authenticated By: Ulyses Southward, M.D.     Scheduled Meds: . albuterol  2.5 mg Nebulization Q4H  . azithromycin  500 mg Intravenous Q24H  . cefTRIAXone (ROCEPHIN)  IV  1 g Intravenous Q24H  . enoxaparin (LOVENOX) injection  40 mg Subcutaneous Q24H  . furosemide      . ipratropium  0.5 mg Nebulization Q4H  . oxybutynin  10 mg Oral Daily  . pantoprazole  40 mg Oral Daily  . potassium chloride  30 mEq Oral Q4H  . vitamin C  500 mg Oral Daily   Continuous Infusions:   Active Problems:   CAP (community acquired pneumonia)   Leukocytosis, unspecified   HTN (hypertension)   ARF (acute renal failure)    Time spent: 40 minutes    HERNANDEZ ACOSTA,Jakaila Norment  [facility] Pager [phone]  If 7PM-7AM, please contact night-coverage at [email or web address], password [facility] 04/05/2013, 9:39 AM  LOS: 1 day

## 2013-04-06 LAB — BASIC METABOLIC PANEL
BUN: 22 mg/dL (ref 6–23)
CO2: 22 mEq/L (ref 19–32)
Calcium: 8.1 mg/dL — ABNORMAL LOW (ref 8.4–10.5)
Chloride: 102 mEq/L (ref 96–112)
Creatinine, Ser: 1.24 mg/dL (ref 0.50–1.35)
GFR calc Af Amer: 66 mL/min — ABNORMAL LOW (ref >90)
GFR calc non Af Amer: 57 mL/min — ABNORMAL LOW (ref >90)
Glucose, Bld: 104 mg/dL — ABNORMAL HIGH (ref 70–99)
Potassium: 3.6 mEq/L (ref 3.5–5.1)
Sodium: 135 mEq/L (ref 135–145)

## 2013-04-06 LAB — URINE CULTURE
Colony Count: NO GROWTH
Culture: NO GROWTH

## 2013-04-06 LAB — CBC
HCT: 32 % — ABNORMAL LOW (ref 39.0–52.0)
Hemoglobin: 11.1 g/dL — ABNORMAL LOW (ref 13.0–17.0)
MCH: 32.1 pg (ref 26.0–34.0)
MCHC: 34.7 g/dL (ref 30.0–36.0)
MCV: 92.5 fL (ref 78.0–100.0)
Platelets: 230 10*3/uL (ref 150–400)
RBC: 3.46 MIL/uL — ABNORMAL LOW (ref 4.22–5.81)
RDW: 13.1 % (ref 11.5–15.5)
WBC: 9.7 10*3/uL (ref 4.0–10.5)

## 2013-04-06 LAB — BLOOD GAS, ARTERIAL
Acid-base deficit: 3.3 mmol/L — ABNORMAL HIGH (ref 0.0–2.0)
Bicarbonate: 18.9 mEq/L — ABNORMAL LOW (ref 20.0–24.0)
Delivery systems: POSITIVE
Drawn by: 244901
Expiratory PAP: 6
FIO2: 0.5 %
Inspiratory PAP: 12
O2 Saturation: 94.6 %
Patient temperature: 102.3
TCO2: 17 mmol/L (ref 0–100)
pCO2 arterial: 28.7 mmHg — ABNORMAL LOW (ref 35.0–45.0)
pH, Arterial: 7.443 (ref 7.350–7.450)
pO2, Arterial: 78.4 mmHg — ABNORMAL LOW (ref 80.0–100.0)

## 2013-04-06 NOTE — Progress Notes (Signed)
Pt with increase WOB (1200-1569mL), RR mid 30s on Bipap 50%.  Sats 98-100. Temp 102.3.  [facility] notified and told to call elink.  Update given to e-link with orders received and will continue to monitor.

## 2013-04-06 NOTE — Progress Notes (Signed)
[facility] PROGRESS NOTE  Robert Dixon ZOX:[id number] DOB: 03-Feb-1941 DOA: 04/04/2013 PCP: Thora Lance, MD  Assessment/Plan: Community acquired pneumonia -Legionella urinary antigen is positive. -Continue Rocephin/azithromycin for now. -Will keep in step down unit as he has required intermittent BiPAP for hypoxemia and increased work of breathing. -Still febrile, leukocytosis has resolved.  Hypertension -All BP meds are on hold for now.  Acute renal failure -Improving, creatinine down to 1.24. -I question whether he may have some chronic kidney disease secondary to long-standing hypertension.  Code Status: Full code Family Communication: Patient only  Disposition Plan: Keep in step down unit today in case he needs BiPAP again.   Consultants:  None   Antibiotics:  Rocephin day 3  Azithromycin day 23  Subjective:  He is hungry. Is alert and talkative.  Objective: Filed Vitals:   04/06/13 0300 04/06/13 0324 04/06/13 0750 04/06/13 0800  BP: 137/65     Pulse: 102 102    Temp:    102.2 F (39 C)  TempSrc:    Axillary  Resp: 29 30    Height:      Weight:      SpO2:  97% 91%     Intake/Output Summary (Last 24 hours) at 04/06/13 0944 Last data filed at 04/06/13 0700  Gross per 24 hour  Intake   1630 ml  Output    940 ml  Net    690 ml   Filed Weights   04/04/13 1756 04/04/13 2105  Weight: 84.1 kg (185 lb 6.5 oz) 85.1 kg (187 lb 9.8 oz)    Exam:   General:  Alert, awake, oriented x3.  Cardiovascular: Regular rate and rhythm, no murmurs, rubs gallops.  Respiratory: Rhonchi and decreased breath sounds left upper lobe  Abdomen: Soft, nontender, nondistended, positive bowel sounds, no masses or organomegaly noted  Extremities: No clubbing, cyanosis or edema   Neurologic:  Grossly intact and nonfocal.  Data Reviewed: Basic Metabolic Panel:  Recent Labs Lab 04/04/13 1150 04/04/13 2112 04/05/13 0356 04/06/13 0403  NA 136 138 135 135   K 3.8 3.2* 3.1* 3.6  CL 100 105 102 102  CO2 21 20 21 22   GLUCOSE 138* 129* 110* 104*  BUN 26* 28* 26* 22  CREATININE 1.43* 1.48* 1.42* 1.24  CALCIUM 8.5 8.1* 7.9* 8.1*  MG  --  1.9  --   --    Liver Function Tests:  Recent Labs Lab 04/04/13 1150 04/04/13 2112  AST 50* 59*  ALT 25 29  ALKPHOS 88 81  BILITOT 1.1 0.6  PROT 6.6 5.9*  ALBUMIN 3.1* 2.6*   No results found for this basename: LIPASE, AMYLASE,  in the last 168 hours No results found for this basename: AMMONIA,  in the last 168 hours CBC:  Recent Labs Lab 04/04/13 1150 04/04/13 2112 04/05/13 0356 04/06/13 0403  WBC 16.3* 13.3* 12.2* 9.7  NEUTROABS 14.5*  --   --   --   HGB 12.6* 11.5* 11.6* 11.1*  HCT 36.4* 33.0* 33.2* 32.0*  MCV 92.9 93.5 93.3 92.5  PLT 235 223 217 230   Cardiac Enzymes:  Recent Labs Lab 04/04/13 2112 04/05/13 0356 04/05/13 0910  TROPONINI <0.30 <0.30 <0.30   BNP (last 3 results) No results found for this basename: PROBNP,  in the last 8760 hours CBG: No results found for this basename: GLUCAP,  in the last 168 hours  Recent Results (from the past 240 hour(s))  CULTURE, BLOOD (ROUTINE X 2)     Status:  None   Collection Time    04/04/13 11:50 AM      Result Value Range Status   Specimen Description BLOOD LEFT ARM  5 ML IN [facility] BOTTLE   Final   Special Requests NONE   Final   Culture  Setup Time 04/04/2013 17:56   Final   Culture     Final   Value:        BLOOD CULTURE RECEIVED NO GROWTH TO DATE CULTURE WILL BE HELD FOR 5 DAYS BEFORE ISSUING A FINAL NEGATIVE REPORT   Report Status PENDING   Incomplete  CULTURE, BLOOD (ROUTINE X 2)     Status: None   Collection Time    04/04/13 12:00 PM      Result Value Range Status   Specimen Description BLOOD RIGHT FOREARM  8 ML IN [facility] BOTTLE   Final   Special Requests NONE   Final   Culture  Setup Time 04/04/2013 17:56   Final   Culture     Final   Value:        BLOOD CULTURE RECEIVED NO GROWTH TO DATE CULTURE WILL BE HELD FOR 5  DAYS BEFORE ISSUING A FINAL NEGATIVE REPORT   Report Status PENDING   Incomplete  CULTURE, EXPECTORATED SPUTUM-ASSESSMENT     Status: None   Collection Time    04/04/13  7:33 PM      Result Value Range Status   Specimen Description SPUTUM   Final   Special Requests NONE   Final   Sputum evaluation     Final   Value: THIS SPECIMEN IS ACCEPTABLE. RESPIRATORY CULTURE REPORT TO FOLLOW.   Report Status 04/04/2013 FINAL   Final  CULTURE, RESPIRATORY (NON-EXPECTORATED)     Status: None   Collection Time    04/04/13  7:33 PM      Result Value Range Status   Specimen Description SPUTUM   Final   Special Requests NONE   Final   Gram Stain     Final   Value: MODERATE WBC PRESENT,BOTH PMN AND MONONUCLEAR     NO SQUAMOUS EPITHELIAL CELLS SEEN     RARE GRAM POSITIVE RODS     RARE GRAM POSITIVE COCCI     IN PAIRS RARE YEAST   Culture PENDING   Incomplete   Report Status PENDING   Incomplete  MRSA PCR SCREENING     Status: None   Collection Time    04/04/13  9:40 PM      Result Value Range Status   MRSA by PCR NEGATIVE  NEGATIVE Final   Comment:            The GeneXpert MRSA Assay (FDA     approved for NASAL specimens     only), is one component of a     comprehensive MRSA colonization     surveillance program. It is not     intended to diagnose MRSA     infection nor to guide or     monitor treatment for     MRSA infections.     Studies: Dg Chest 2 View  04/04/2013  *RADIOLOGY REPORT*  Clinical Data: Fever, weakness.  CHEST - 2 VIEW  Comparison: None.  Findings: Cardiomediastinal silhouette appears normal.  Large alveolar opacity is seen laterally in the left upper lobe consistent with pneumonia.  Right lung is clear.  No pleural effusion or pneumothorax is noted.  IMPRESSION: Large left upper lobe pneumonia.   Original Report Authenticated By: Roque Lias  Montez Hageman.,  M.D.    Dg Chest Port 1 View  04/04/2013  *RADIOLOGY REPORT*  Clinical Data: Shortness of breath, decreased oxygen saturation   PORTABLE CHEST - 1 VIEW  Comparison: Portable exam 2053 hours compared to 1222 hours  Findings: Normal heart size and pulmonary vascularity. Slightly prominent right paratracheal soft tissues unchanged. Atherosclerotic calcification aorta. Extensive airspace consolidation left lung consistent with pneumonia. Mild atelectasis right base. No gross pleural effusion or pneumothorax. Bones demineralized.  IMPRESSION: Extensive left lung pneumonia, unchanged.   Original Report Authenticated By: Ulyses Southward, M.D.     Scheduled Meds: . albuterol  2.5 mg Nebulization Q4H  . antiseptic oral rinse  15 mL Mouth Rinse q12n4p  . azithromycin  500 mg Intravenous Q24H  . cefTRIAXone (ROCEPHIN)  IV  1 g Intravenous Q24H  . chlorhexidine  15 mL Mouth Rinse BID  . enoxaparin (LOVENOX) injection  40 mg Subcutaneous Q24H  . ipratropium  0.5 mg Nebulization Q4H  . oxybutynin  10 mg Oral Daily  . pantoprazole  40 mg Oral Daily  . vitamin C  500 mg Oral Daily   Continuous Infusions:   Active Problems:   CAP (community acquired pneumonia)   Leukocytosis, unspecified   HTN (hypertension)   ARF (acute renal failure)    Time spent: 40 minutes    HERNANDEZ ACOSTA,Tola Meas  [facility] Pager [phone]  If 7PM-7AM, please contact night-coverage at [email or web address], password [facility] 04/06/2013, 9:44 AM  LOS: 2 days

## 2013-04-06 NOTE — Progress Notes (Signed)
Pt found attempting to use urinal at side of bed after previously checked 15 minutes prior. Bed alarm was set.  3 RNs at bedside.  Returned pt back to bed safely.  Bed alarm set after replacing to different bed.  Continue to observe for fall risk closely.

## 2013-04-06 NOTE — Progress Notes (Signed)
Sats dropped into low 90s, so changes made to NIV per protocol.

## 2013-04-07 LAB — CULTURE, RESPIRATORY W GRAM STAIN: Culture: NORMAL

## 2013-04-07 LAB — BASIC METABOLIC PANEL
BUN: 19 mg/dL (ref 6–23)
CO2: 24 mEq/L (ref 19–32)
Calcium: 7.9 mg/dL — ABNORMAL LOW (ref 8.4–10.5)
Chloride: 102 mEq/L (ref 96–112)
Creatinine, Ser: 1.15 mg/dL (ref 0.50–1.35)
GFR calc Af Amer: 72 mL/min — ABNORMAL LOW (ref >90)
GFR calc non Af Amer: 62 mL/min — ABNORMAL LOW (ref >90)
Glucose, Bld: 101 mg/dL — ABNORMAL HIGH (ref 70–99)
Potassium: 3.2 mEq/L — ABNORMAL LOW (ref 3.5–5.1)
Sodium: 133 mEq/L — ABNORMAL LOW (ref 135–145)

## 2013-04-07 LAB — CBC
HCT: 31.6 % — ABNORMAL LOW (ref 39.0–52.0)
Hemoglobin: 11.1 g/dL — ABNORMAL LOW (ref 13.0–17.0)
MCH: 32.5 pg (ref 26.0–34.0)
MCHC: 35.1 g/dL (ref 30.0–36.0)
MCV: 92.4 fL (ref 78.0–100.0)
Platelets: 238 10*3/uL (ref 150–400)
RBC: 3.42 MIL/uL — ABNORMAL LOW (ref 4.22–5.81)
RDW: 13 % (ref 11.5–15.5)
WBC: 8.9 10*3/uL (ref 4.0–10.5)

## 2013-04-07 MED ORDER — ARTIFICIAL TEARS OP OINT
TOPICAL_OINTMENT | OPHTHALMIC | Status: DC | PRN
  Administered 2013-04-07: 22:00:00 via OPHTHALMIC
  Administered 2013-04-08: 1 via OPHTHALMIC
  Administered 2013-04-08: 20:00:00 via OPHTHALMIC
  Administered 2013-04-08 – 2013-04-09 (×4): 1 via OPHTHALMIC
  Administered 2013-04-09 – 2013-04-11 (×4): via OPHTHALMIC
  Administered 2013-04-13: 1 via OPHTHALMIC
  Filled 2013-04-07 (×2): qty 3.5

## 2013-04-07 MED ORDER — LORAZEPAM 1 MG PO TABS
2.0000 mg | ORAL_TABLET | Freq: Once | ORAL | Status: AC
  Administered 2013-04-07: 2 mg via ORAL
  Filled 2013-04-07: qty 2

## 2013-04-07 MED ORDER — CALCIUM CARBONATE ANTACID 500 MG PO CHEW
2.0000 | CHEWABLE_TABLET | Freq: Four times a day (QID) | ORAL | Status: DC | PRN
  Administered 2013-04-07: 400 mg via ORAL
  Filled 2013-04-07: qty 2

## 2013-04-07 NOTE — Progress Notes (Signed)
[facility] PROGRESS NOTE  Robert Dixon ZOX:[id number] DOB: 1941/10/08 DOA: 04/04/2013 PCP: Thora Lance, MD  Assessment/Plan: Community acquired pneumonia -Legionella urinary antigen is positive. -Continue Rocephin/azithromycin for now. -Will keep in step down unit as he has required intermittent BiPAP for hypoxemia and increased work of breathing. -Fever curve is starting to trend downward.  Hypertension -All BP meds are on hold for now.  Acute renal failure -Resolved.  Code Status: Full code Family Communication: Patient only  Disposition Plan: Keep in step down unit today in case he needs BiPAP again.   Consultants:  None   Antibiotics:  Rocephin day 4  Azithromycin day 4  Subjective: Alert and talkative.  Objective: Filed Vitals:   04/07/13 0406 04/07/13 0710 04/07/13 0800 04/07/13 0804  BP:  121/75    Pulse:  92    Temp:   98.5 F (36.9 C)   TempSrc:   Axillary   Resp:  21    Height:      Weight:      SpO2: 96% 94%  91%    Intake/Output Summary (Last 24 hours) at 04/07/13 1004 Last data filed at 04/07/13 0900  Gross per 24 hour  Intake   1225 ml  Output   1050 ml  Net    175 ml   Filed Weights   04/04/13 1756 04/04/13 2105  Weight: 84.1 kg (185 lb 6.5 oz) 85.1 kg (187 lb 9.8 oz)    Exam:   General:  Alert, awake, oriented x3.  Cardiovascular: Regular rate and rhythm, no murmurs, rubs gallops.  Respiratory: Rhonchi and decreased breath sounds left upper lobe  Abdomen: Soft, nontender, nondistended, positive bowel sounds, no masses or organomegaly noted  Extremities: No clubbing, cyanosis or edema   Neurologic:  Grossly intact and nonfocal.  Data Reviewed: Basic Metabolic Panel:  Recent Labs Lab 04/04/13 1150 04/04/13 2112 04/05/13 0356 04/06/13 0403 04/07/13 0341  NA 136 138 135 135 133*  K 3.8 3.2* 3.1* 3.6 3.2*  CL 100 105 102 102 102  CO2 21 20 21 22 24   GLUCOSE 138* 129* 110* 104* 101*  BUN 26* 28* 26* 22  19  CREATININE 1.43* 1.48* 1.42* 1.24 1.15  CALCIUM 8.5 8.1* 7.9* 8.1* 7.9*  MG  --  1.9  --   --   --    Liver Function Tests:  Recent Labs Lab 04/04/13 1150 04/04/13 2112  AST 50* 59*  ALT 25 29  ALKPHOS 88 81  BILITOT 1.1 0.6  PROT 6.6 5.9*  ALBUMIN 3.1* 2.6*   No results found for this basename: LIPASE, AMYLASE,  in the last 168 hours No results found for this basename: AMMONIA,  in the last 168 hours CBC:  Recent Labs Lab 04/04/13 1150 04/04/13 2112 04/05/13 0356 04/06/13 0403 04/07/13 0341  WBC 16.3* 13.3* 12.2* 9.7 8.9  NEUTROABS 14.5*  --   --   --   --   HGB 12.6* 11.5* 11.6* 11.1* 11.1*  HCT 36.4* 33.0* 33.2* 32.0* 31.6*  MCV 92.9 93.5 93.3 92.5 92.4  PLT 235 223 217 230 238   Cardiac Enzymes:  Recent Labs Lab 04/04/13 2112 04/05/13 0356 04/05/13 0910  TROPONINI <0.30 <0.30 <0.30   BNP (last 3 results) No results found for this basename: PROBNP,  in the last 8760 hours CBG: No results found for this basename: GLUCAP,  in the last 168 hours  Recent Results (from the past 240 hour(s))  CULTURE, BLOOD (ROUTINE X 2)  Status: None   Collection Time    04/04/13 11:50 AM      Result Value Range Status   Specimen Description BLOOD LEFT ARM  5 ML IN [facility] BOTTLE   Final   Special Requests NONE   Final   Culture  Setup Time 04/04/2013 17:56   Final   Culture     Final   Value:        BLOOD CULTURE RECEIVED NO GROWTH TO DATE CULTURE WILL BE HELD FOR 5 DAYS BEFORE ISSUING A FINAL NEGATIVE REPORT   Report Status PENDING   Incomplete  CULTURE, BLOOD (ROUTINE X 2)     Status: None   Collection Time    04/04/13 12:00 PM      Result Value Range Status   Specimen Description BLOOD RIGHT FOREARM  8 ML IN [facility] BOTTLE   Final   Special Requests NONE   Final   Culture  Setup Time 04/04/2013 17:56   Final   Culture     Final   Value:        BLOOD CULTURE RECEIVED NO GROWTH TO DATE CULTURE WILL BE HELD FOR 5 DAYS BEFORE ISSUING A FINAL NEGATIVE REPORT    Report Status PENDING   Incomplete  URINE CULTURE     Status: None   Collection Time    04/04/13  1:31 PM      Result Value Range Status   Specimen Description URINE, CLEAN CATCH   Final   Special Requests NONE   Final   Culture  Setup Time 04/05/2013 00:31   Final   Colony Count NO GROWTH   Final   Culture NO GROWTH   Final   Report Status 04/06/2013 FINAL   Final  CULTURE, EXPECTORATED SPUTUM-ASSESSMENT     Status: None   Collection Time    04/04/13  7:33 PM      Result Value Range Status   Specimen Description SPUTUM   Final   Special Requests NONE   Final   Sputum evaluation     Final   Value: THIS SPECIMEN IS ACCEPTABLE. RESPIRATORY CULTURE REPORT TO FOLLOW.   Report Status 04/04/2013 FINAL   Final  CULTURE, RESPIRATORY (NON-EXPECTORATED)     Status: None   Collection Time    04/04/13  7:33 PM      Result Value Range Status   Specimen Description SPUTUM   Final   Special Requests NONE   Final   Gram Stain     Final   Value: MODERATE WBC PRESENT,BOTH PMN AND MONONUCLEAR     NO SQUAMOUS EPITHELIAL CELLS SEEN     RARE GRAM POSITIVE RODS     RARE GRAM POSITIVE COCCI     IN PAIRS RARE YEAST   Culture Culture reincubated for better growth   Final   Report Status PENDING   Incomplete  MRSA PCR SCREENING     Status: None   Collection Time    04/04/13  9:40 PM      Result Value Range Status   MRSA by PCR NEGATIVE  NEGATIVE Final   Comment:            The GeneXpert MRSA Assay (FDA     approved for NASAL specimens     only), is one component of a     comprehensive MRSA colonization     surveillance program. It is not     intended to diagnose MRSA     infection nor to guide or  monitor treatment for     MRSA infections.     Studies: No results found.  Scheduled Meds: . albuterol  2.5 mg Nebulization Q4H  . antiseptic oral rinse  15 mL Mouth Rinse q12n4p  . azithromycin  500 mg Intravenous Q24H  . cefTRIAXone (ROCEPHIN)  IV  1 g Intravenous Q24H  . chlorhexidine   15 mL Mouth Rinse BID  . enoxaparin (LOVENOX) injection  40 mg Subcutaneous Q24H  . ipratropium  0.5 mg Nebulization Q4H  . oxybutynin  10 mg Oral Daily  . pantoprazole  40 mg Oral Daily  . vitamin C  500 mg Oral Daily   Continuous Infusions:   Active Problems:   CAP (community acquired pneumonia)   Leukocytosis, unspecified   HTN (hypertension)   ARF (acute renal failure)    Time spent: 35 minutes    HERNANDEZ ACOSTA,Robert Dixon  [facility] Pager [phone]  If 7PM-7AM, please contact night-coverage at [email or web address], password [facility] 04/07/2013, 10:04 AM  LOS: 3 days

## 2013-04-08 LAB — LEGIONELLA ANTIGEN, URINE: Legionella Antigen, Urine: POSITIVE

## 2013-04-08 LAB — CBC
HCT: 30.4 % — ABNORMAL LOW (ref 39.0–52.0)
Hemoglobin: 10.6 g/dL — ABNORMAL LOW (ref 13.0–17.0)
MCH: 32.4 pg (ref 26.0–34.0)
MCHC: 34.9 g/dL (ref 30.0–36.0)
MCV: 93 fL (ref 78.0–100.0)
Platelets: 247 10*3/uL (ref 150–400)
RBC: 3.27 MIL/uL — ABNORMAL LOW (ref 4.22–5.81)
RDW: 13.1 % (ref 11.5–15.5)
WBC: 7.7 10*3/uL (ref 4.0–10.5)

## 2013-04-08 LAB — BASIC METABOLIC PANEL
BUN: 17 mg/dL (ref 6–23)
CO2: 28 mEq/L (ref 19–32)
Calcium: 7.9 mg/dL — ABNORMAL LOW (ref 8.4–10.5)
Chloride: 99 mEq/L (ref 96–112)
Creatinine, Ser: 1.12 mg/dL (ref 0.50–1.35)
GFR calc Af Amer: 74 mL/min — ABNORMAL LOW (ref >90)
GFR calc non Af Amer: 64 mL/min — ABNORMAL LOW (ref >90)
Glucose, Bld: 95 mg/dL (ref 70–99)
Potassium: 3.4 mEq/L — ABNORMAL LOW (ref 3.5–5.1)
Sodium: 135 mEq/L (ref 135–145)

## 2013-04-08 NOTE — Progress Notes (Signed)
[facility] PROGRESS NOTE  Robert Dixon AVW:[id number] DOB: 28-Jul-1941 DOA: 04/04/2013 PCP: Thora Lance, MD  Assessment/Plan: Community acquired pneumonia -Legionella urinary antigen is positive. -Continue Rocephin/azithromycin for now. -Will keep in step down unit as he has required intermittent BiPAP for hypoxemia and increased work of breathing. -Fever curve is starting to trend downward. -Plan to recheck chest x-ray in the morning.  Hypertension -All BP meds are on hold for now.  Acute renal failure -Resolved.  Code Status: Full code Family Communication: Patient only  Disposition Plan: Keep in step down unit today in case he needs BiPAP again.   Consultants:  None   Antibiotics:  Rocephin day 5  Azithromycin day 5  Subjective: Alert and talkative.  Objective: Filed Vitals:   04/08/13 0820 04/08/13 0830 04/08/13 1000 04/08/13 1121  BP:   136/60   Pulse: 95 94 99   Temp:      TempSrc:      Resp: 28 23 21    Height:      Weight:      SpO2: 90% 95% 92% 94%    Intake/Output Summary (Last 24 hours) at 04/08/13 1153 Last data filed at 04/08/13 1130  Gross per 24 hour  Intake    440 ml  Output   1275 ml  Net   -835 ml   Filed Weights   04/04/13 1756 04/04/13 2105  Weight: 84.1 kg (185 lb 6.5 oz) 85.1 kg (187 lb 9.8 oz)    Exam:   General:  Alert, awake, oriented x3.  Cardiovascular: Regular rate and rhythm, no murmurs, rubs gallops.  Respiratory: Rhonchi and decreased breath sounds left upper lobe  Abdomen: Soft, nontender, nondistended, positive bowel sounds, no masses or organomegaly noted  Extremities: No clubbing, cyanosis or edema   Neurologic:  Grossly intact and nonfocal.  Data Reviewed: Basic Metabolic Panel:  Recent Labs Lab 04/04/13 1150 04/04/13 2112 04/05/13 0356 04/06/13 0403 04/07/13 0341 04/08/13 0345  NA 136 138 135 135 133* 135  K 3.8 3.2* 3.1* 3.6 3.2* 3.4*  CL 100 105 102 102 102 99  CO2 21 20 21 22  24 28   GLUCOSE 138* 129* 110* 104* 101* 95  BUN 26* 28* 26* 22 19 17   CREATININE 1.43* 1.48* 1.42* 1.24 1.15 1.12  CALCIUM 8.5 8.1* 7.9* 8.1* 7.9* 7.9*  MG  --  1.9  --   --   --   --    Liver Function Tests:  Recent Labs Lab 04/04/13 1150 04/04/13 2112  AST 50* 59*  ALT 25 29  ALKPHOS 88 81  BILITOT 1.1 0.6  PROT 6.6 5.9*  ALBUMIN 3.1* 2.6*   No results found for this basename: LIPASE, AMYLASE,  in the last 168 hours No results found for this basename: AMMONIA,  in the last 168 hours CBC:  Recent Labs Lab 04/04/13 1150 04/04/13 2112 04/05/13 0356 04/06/13 0403 04/07/13 0341 04/08/13 0345  WBC 16.3* 13.3* 12.2* 9.7 8.9 7.7  NEUTROABS 14.5*  --   --   --   --   --   HGB 12.6* 11.5* 11.6* 11.1* 11.1* 10.6*  HCT 36.4* 33.0* 33.2* 32.0* 31.6* 30.4*  MCV 92.9 93.5 93.3 92.5 92.4 93.0  PLT 235 223 217 230 238 247   Cardiac Enzymes:  Recent Labs Lab 04/04/13 2112 04/05/13 0356 04/05/13 0910  TROPONINI <0.30 <0.30 <0.30   BNP (last 3 results) No results found for this basename: PROBNP,  in the last 8760 hours CBG: No results found  for this basename: GLUCAP,  in the last 168 hours  Recent Results (from the past 240 hour(s))  CULTURE, BLOOD (ROUTINE X 2)     Status: None   Collection Time    04/04/13 11:50 AM      Result Value Range Status   Specimen Description BLOOD LEFT ARM  5 ML IN [facility] BOTTLE   Final   Special Requests NONE   Final   Culture  Setup Time 04/04/2013 17:56   Final   Culture     Final   Value:        BLOOD CULTURE RECEIVED NO GROWTH TO DATE CULTURE WILL BE HELD FOR 5 DAYS BEFORE ISSUING A FINAL NEGATIVE REPORT   Report Status PENDING   Incomplete  CULTURE, BLOOD (ROUTINE X 2)     Status: None   Collection Time    04/04/13 12:00 PM      Result Value Range Status   Specimen Description BLOOD RIGHT FOREARM  8 ML IN [facility] BOTTLE   Final   Special Requests NONE   Final   Culture  Setup Time 04/04/2013 17:56   Final   Culture     Final   Value:         BLOOD CULTURE RECEIVED NO GROWTH TO DATE CULTURE WILL BE HELD FOR 5 DAYS BEFORE ISSUING A FINAL NEGATIVE REPORT   Report Status PENDING   Incomplete  URINE CULTURE     Status: None   Collection Time    04/04/13  1:31 PM      Result Value Range Status   Specimen Description URINE, CLEAN CATCH   Final   Special Requests NONE   Final   Culture  Setup Time 04/05/2013 00:31   Final   Colony Count NO GROWTH   Final   Culture NO GROWTH   Final   Report Status 04/06/2013 FINAL   Final  CULTURE, EXPECTORATED SPUTUM-ASSESSMENT     Status: None   Collection Time    04/04/13  7:33 PM      Result Value Range Status   Specimen Description SPUTUM   Final   Special Requests NONE   Final   Sputum evaluation     Final   Value: THIS SPECIMEN IS ACCEPTABLE. RESPIRATORY CULTURE REPORT TO FOLLOW.   Report Status 04/04/2013 FINAL   Final  CULTURE, RESPIRATORY (NON-EXPECTORATED)     Status: None   Collection Time    04/04/13  7:33 PM      Result Value Range Status   Specimen Description SPUTUM   Final   Special Requests NONE   Final   Gram Stain     Final   Value: MODERATE WBC PRESENT,BOTH PMN AND MONONUCLEAR     NO SQUAMOUS EPITHELIAL CELLS SEEN     RARE GRAM POSITIVE RODS     RARE GRAM POSITIVE COCCI     IN PAIRS RARE YEAST   Culture NORMAL OROPHARYNGEAL FLORA   Final   Report Status 04/07/2013 FINAL   Final  MRSA PCR SCREENING     Status: None   Collection Time    04/04/13  9:40 PM      Result Value Range Status   MRSA by PCR NEGATIVE  NEGATIVE Final   Comment:            The GeneXpert MRSA Assay (FDA     approved for NASAL specimens     only), is one component of a     comprehensive MRSA  colonization     surveillance program. It is not     intended to diagnose MRSA     infection nor to guide or     monitor treatment for     MRSA infections.     Studies: No results found.  Scheduled Meds: . albuterol  2.5 mg Nebulization Q4H  . antiseptic oral rinse  15 mL Mouth Rinse  q12n4p  . azithromycin  500 mg Intravenous Q24H  . cefTRIAXone (ROCEPHIN)  IV  1 g Intravenous Q24H  . chlorhexidine  15 mL Mouth Rinse BID  . enoxaparin (LOVENOX) injection  40 mg Subcutaneous Q24H  . ipratropium  0.5 mg Nebulization Q4H  . oxybutynin  10 mg Oral Daily  . pantoprazole  40 mg Oral Daily  . vitamin C  500 mg Oral Daily   Continuous Infusions:   Active Problems:   CAP (community acquired pneumonia)   Leukocytosis, unspecified   HTN (hypertension)   ARF (acute renal failure)    Time spent: 35 minutes    HERNANDEZ ACOSTA,Brinleigh Tew  [facility] Pager [phone]  If 7PM-7AM, please contact night-coverage at [email or web address], password [facility] 04/08/2013, 11:53 AM  LOS: 4 days

## 2013-04-09 ENCOUNTER — Inpatient Hospital Stay (HOSPITAL_COMMUNITY): Payer: 59

## 2013-04-09 DIAGNOSIS — J9600 Acute respiratory failure, unspecified whether with hypoxia or hypercapnia: Secondary | ICD-10-CM

## 2013-04-09 DIAGNOSIS — E876 Hypokalemia: Secondary | ICD-10-CM

## 2013-04-09 HISTORY — DX: Acute respiratory failure, unspecified whether with hypoxia or hypercapnia: J96.00

## 2013-04-09 LAB — BASIC METABOLIC PANEL
BUN: 14 mg/dL (ref 6–23)
CO2: 30 mEq/L (ref 19–32)
Calcium: 7.6 mg/dL — ABNORMAL LOW (ref 8.4–10.5)
Chloride: 99 mEq/L (ref 96–112)
Creatinine, Ser: 1.05 mg/dL (ref 0.50–1.35)
GFR calc Af Amer: 80 mL/min — ABNORMAL LOW (ref >90)
GFR calc non Af Amer: 69 mL/min — ABNORMAL LOW (ref >90)
Glucose, Bld: 87 mg/dL (ref 70–99)
Potassium: 3 mEq/L — ABNORMAL LOW (ref 3.5–5.1)
Sodium: 136 mEq/L (ref 135–145)

## 2013-04-09 LAB — CBC
HCT: 30.3 % — ABNORMAL LOW (ref 39.0–52.0)
Hemoglobin: 10.4 g/dL — ABNORMAL LOW (ref 13.0–17.0)
MCH: 32 pg (ref 26.0–34.0)
MCHC: 34.3 g/dL (ref 30.0–36.0)
MCV: 93.2 fL (ref 78.0–100.0)
Platelets: 309 10*3/uL (ref 150–400)
RBC: 3.25 MIL/uL — ABNORMAL LOW (ref 4.22–5.81)
RDW: 13 % (ref 11.5–15.5)
WBC: 6.4 10*3/uL (ref 4.0–10.5)

## 2013-04-09 MED ORDER — POTASSIUM CHLORIDE CRYS ER 20 MEQ PO TBCR
40.0000 meq | EXTENDED_RELEASE_TABLET | Freq: Once | ORAL | Status: AC
  Administered 2013-04-09: 40 meq via ORAL
  Filled 2013-04-09: qty 2

## 2013-04-09 NOTE — Progress Notes (Signed)
[facility] PROGRESS NOTE  Robert Dixon:[id number] DOB: July 08, 1941 DOA: 04/04/2013 PCP: Thora Lance, MD  Assessment/Plan: 1. Respiratory Failure - Pt has required Bipap while in house as such will keep patient in step down for prn bipap - Reportedly has done well overnight without the bipap machine.  Still requiring supplemental oxygen - 2ary to PNA  2. CAP - strep urine antigen negative - + urine antigen for legionella - Continue Azithromycin and Rocephin today is day 6 (Depending on hospital course will aim for a prolonged antibiotic regimen of 10-14 days) - CXR 5/14 reported and interval improvement in L lung consolidation.  3. Hypokalemia - replace orally and reassess next am - 5/14 K level 3.0  4. ARF - Creatinine within normal limits at 1.05 - has resolved.  5. Leukocytosis - 2ary to # 2 - has resolved with improvement in condition and IV antibiotics   Code Status: full Family Communication: Discussed with patient and son at bedside.  Disposition Plan: Pending continued improvement in condition.  If patient does not require bipap and oxygen saturations steady will plan on transitioning to floor (most likely within the next 1-2 days).    Consultants:  none  Procedures:  Bipap  Antibiotics:  Azithro and Rocephin day 6  HPI/Subjective: Patient feeling better. Reportedly did not require bipap overnight.  No new complaints.    Objective: Filed Vitals:   04/08/13 2322 04/09/13 0010 04/09/13 0300 04/09/13 0400  BP:  145/60  147/62  Pulse:  95  86  Temp:  99.8 F (37.7 C)  99.6 F (37.6 C)  TempSrc:  Axillary  Axillary  Resp:  18  21  Height:      Weight:    81.6 kg (179 lb 14.3 oz)  SpO2: 95% 96% 97% 98%    Intake/Output Summary (Last 24 hours) at 04/09/13 0815 Last data filed at 04/09/13 0400  Gross per 24 hour  Intake    300 ml  Output   1625 ml  Net  -1325 ml   Filed Weights   04/04/13 1756 04/04/13 2105 04/09/13 0400  Weight:  84.1 kg (185 lb 6.5 oz) 85.1 kg (187 lb 9.8 oz) 81.6 kg (179 lb 14.3 oz)    Exam:   General:  Pt in NAD, Alert and Awake  Cardiovascular: N S1 and S2, no murmurs  Respiratory: Rhales at left lung field, diminished breath sounds at left lung when compared to right. No wheezes,  Breath sounds heard BL  Abdomen: soft, obese, NT  Musculoskeletal: no cyanosis or clubbing   Data Reviewed: Basic Metabolic Panel:  Recent Labs Lab 04/04/13 1150 04/04/13 2112 04/05/13 0356 04/06/13 0403 04/07/13 0341 04/08/13 0345 04/09/13 0317  NA 136 138 135 135 133* 135 136  K 3.8 3.2* 3.1* 3.6 3.2* 3.4* 3.0*  CL 100 105 102 102 102 99 99  CO2 21 20 21 22 24 28 30   GLUCOSE 138* 129* 110* 104* 101* 95 87  BUN 26* 28* 26* 22 19 17 14   CREATININE 1.43* 1.48* 1.42* 1.24 1.15 1.12 1.05  CALCIUM 8.5 8.1* 7.9* 8.1* 7.9* 7.9* 7.6*  MG  --  1.9  --   --   --   --   --    Liver Function Tests:  Recent Labs Lab 04/04/13 1150 04/04/13 2112  AST 50* 59*  ALT 25 29  ALKPHOS 88 81  BILITOT 1.1 0.6  PROT 6.6 5.9*  ALBUMIN 3.1* 2.6*   No results found for this  basename: LIPASE, AMYLASE,  in the last 168 hours No results found for this basename: AMMONIA,  in the last 168 hours CBC:  Recent Labs Lab 04/04/13 1150  04/05/13 0356 04/06/13 0403 04/07/13 0341 04/08/13 0345 04/09/13 0317  WBC 16.3*  < > 12.2* 9.7 8.9 7.7 6.4  NEUTROABS 14.5*  --   --   --   --   --   --   HGB 12.6*  < > 11.6* 11.1* 11.1* 10.6* 10.4*  HCT 36.4*  < > 33.2* 32.0* 31.6* 30.4* 30.3*  MCV 92.9  < > 93.3 92.5 92.4 93.0 93.2  PLT 235  < > 217 230 238 247 309  < > = values in this interval not displayed. Cardiac Enzymes:  Recent Labs Lab 04/04/13 2112 04/05/13 0356 04/05/13 0910  TROPONINI <0.30 <0.30 <0.30   BNP (last 3 results) No results found for this basename: PROBNP,  in the last 8760 hours CBG: No results found for this basename: GLUCAP,  in the last 168 hours  Recent Results (from the past 240  hour(s))  CULTURE, BLOOD (ROUTINE X 2)     Status: None   Collection Time    04/04/13 11:50 AM      Result Value Range Status   Specimen Description BLOOD LEFT ARM  5 ML IN [facility] BOTTLE   Final   Special Requests NONE   Final   Culture  Setup Time 04/04/2013 17:56   Final   Culture     Final   Value:        BLOOD CULTURE RECEIVED NO GROWTH TO DATE CULTURE WILL BE HELD FOR 5 DAYS BEFORE ISSUING A FINAL NEGATIVE REPORT   Report Status PENDING   Incomplete  CULTURE, BLOOD (ROUTINE X 2)     Status: None   Collection Time    04/04/13 12:00 PM      Result Value Range Status   Specimen Description BLOOD RIGHT FOREARM  8 ML IN [facility] BOTTLE   Final   Special Requests NONE   Final   Culture  Setup Time 04/04/2013 17:56   Final   Culture     Final   Value:        BLOOD CULTURE RECEIVED NO GROWTH TO DATE CULTURE WILL BE HELD FOR 5 DAYS BEFORE ISSUING A FINAL NEGATIVE REPORT   Report Status PENDING   Incomplete  URINE CULTURE     Status: None   Collection Time    04/04/13  1:31 PM      Result Value Range Status   Specimen Description URINE, CLEAN CATCH   Final   Special Requests NONE   Final   Culture  Setup Time 04/05/2013 00:31   Final   Colony Count NO GROWTH   Final   Culture NO GROWTH   Final   Report Status 04/06/2013 FINAL   Final  CULTURE, EXPECTORATED SPUTUM-ASSESSMENT     Status: None   Collection Time    04/04/13  7:33 PM      Result Value Range Status   Specimen Description SPUTUM   Final   Special Requests NONE   Final   Sputum evaluation     Final   Value: THIS SPECIMEN IS ACCEPTABLE. RESPIRATORY CULTURE REPORT TO FOLLOW.   Report Status 04/04/2013 FINAL   Final  CULTURE, RESPIRATORY (NON-EXPECTORATED)     Status: None   Collection Time    04/04/13  7:33 PM      Result Value Range Status  Specimen Description SPUTUM   Final   Special Requests NONE   Final   Gram Stain     Final   Value: MODERATE WBC PRESENT,BOTH PMN AND MONONUCLEAR     NO SQUAMOUS EPITHELIAL CELLS SEEN      RARE GRAM POSITIVE RODS     RARE GRAM POSITIVE COCCI     IN PAIRS RARE YEAST   Culture NORMAL OROPHARYNGEAL FLORA   Final   Report Status 04/07/2013 FINAL   Final  MRSA PCR SCREENING     Status: None   Collection Time    04/04/13  9:40 PM      Result Value Range Status   MRSA by PCR NEGATIVE  NEGATIVE Final   Comment:            The GeneXpert MRSA Assay (FDA     approved for NASAL specimens     only), is one component of a     comprehensive MRSA colonization     surveillance program. It is not     intended to diagnose MRSA     infection nor to guide or     monitor treatment for     MRSA infections.     Studies: Dg Chest Port 1 View  04/09/2013   *RADIOLOGY REPORT*  Clinical Data: Pneumonia  PORTABLE CHEST - 1 VIEW  Comparison: 04/04/2013  Findings: The diffuse airspace disease in the peripheral aspect the left mid and lower lung persists although the peripheral aspect of the lung laterally appears better aerated.  Interstitial markings are more prominent than previously and both lungs. The cardiopericardial silhouette is within normal limits for size. Imaged bony structures of the thorax are intact. Telemetry leads overlie the chest.  IMPRESSION: Slight interval improvement in the left lung consolidation.   Original Report Authenticated By: Kennith Center, M.D.    Scheduled Meds: . albuterol  2.5 mg Nebulization Q4H  . antiseptic oral rinse  15 mL Mouth Rinse q12n4p  . azithromycin  500 mg Intravenous Q24H  . cefTRIAXone (ROCEPHIN)  IV  1 g Intravenous Q24H  . chlorhexidine  15 mL Mouth Rinse BID  . enoxaparin (LOVENOX) injection  40 mg Subcutaneous Q24H  . ipratropium  0.5 mg Nebulization Q4H  . oxybutynin  10 mg Oral Daily  . pantoprazole  40 mg Oral Daily  . vitamin C  500 mg Oral Daily   Continuous Infusions:   Active Problems:   CAP (community acquired pneumonia)   Leukocytosis, unspecified   HTN (hypertension)   ARF (acute renal failure)   Hypokalemia   Acute  respiratory failure    Time spent: > 40 minutes.    Penny Pia  [facility] Pager [phone] If 7PM-7AM, please contact night-coverage at [email or web address], password [facility] 04/09/2013, 8:15 AM  LOS: 5 days

## 2013-04-10 DIAGNOSIS — H109 Unspecified conjunctivitis: Secondary | ICD-10-CM

## 2013-04-10 LAB — CULTURE, BLOOD (ROUTINE X 2)
Culture: NO GROWTH
Culture: NO GROWTH

## 2013-04-10 LAB — POTASSIUM: Potassium: 2.9 mEq/L — ABNORMAL LOW (ref 3.5–5.1)

## 2013-04-10 MED ORDER — POTASSIUM CHLORIDE CRYS ER 20 MEQ PO TBCR
40.0000 meq | EXTENDED_RELEASE_TABLET | Freq: Two times a day (BID) | ORAL | Status: DC
  Administered 2013-04-10 – 2013-04-11 (×3): 40 meq via ORAL
  Filled 2013-04-10 (×4): qty 2

## 2013-04-10 MED ORDER — AZITHROMYCIN 500 MG PO TABS
500.0000 mg | ORAL_TABLET | Freq: Every day | ORAL | Status: DC
  Administered 2013-04-10 – 2013-04-14 (×5): 500 mg via ORAL
  Filled 2013-04-10 (×5): qty 1

## 2013-04-10 MED ORDER — GATIFLOXACIN 0.5 % OP SOLN
1.0000 [drp] | OPHTHALMIC | Status: AC
  Administered 2013-04-10 – 2013-04-11 (×5): 1 [drp] via OPHTHALMIC
  Filled 2013-04-10: qty 2.5

## 2013-04-10 MED ORDER — GATIFLOXACIN 0.5 % OP SOLN
1.0000 [drp] | Freq: Four times a day (QID) | OPHTHALMIC | Status: DC
  Administered 2013-04-11 – 2013-04-14 (×10): 1 [drp] via OPHTHALMIC
  Filled 2013-04-10: qty 2.5

## 2013-04-10 NOTE — Progress Notes (Signed)
  Pharmacy Note (Brief) - Gatifloxacin Eye Drop Dosing Indication: Bacterial Conjunctivitis  Ophthalmologist recommended moxifloxacin eye drops for bacterial conjunctivitis, not currently stocked in pharmacy, so switching to a formulary fluoroquinolone product, gatifloxacin eye drops. Asked by [facility] to assist with dosing.  1) Gatifloxacin 0.5% drops (Zymaxid) - 1 drop in RIGHT eye every 2 hr while awake (up to 8 times a day) for 1 day, then 1 drop 2 to 4 times a day while awake on days 2 through 7. 2) Pharmacy will sign-off, please reconsult if you have further questions. Thanks!  Darrol Angel, PharmD Pager: [phone] 04/10/2013 11:55 AM

## 2013-04-10 NOTE — Evaluation (Signed)
Physical Therapy Evaluation Patient Details Name: Robert Dixon MRN: [id number] DOB: 01/07/1941 Today's Date: 04/10/2013 Time: 4098-1191 PT Time Calculation (min): 31 min  PT Assessment / Plan / Recommendation Clinical Impression  72 yo male admitted 04/02/13 withrespiratory failure, LUL pneumonia, sepsis. Pt lives alone. Pt will benefit from PT while in acute care. easily desats with activity. pt does well with mobility except for respiratory reserve.    PT Assessment  Patient needs continued PT services    Follow Up Recommendations  SNF    Does the patient have the potential to tolerate intense rehabilitation      Barriers to Discharge Decreased caregiver support      Equipment Recommendations  None recommended by PT    Recommendations for Other Services OT consult   Frequency Min 3X/week    Precautions / Restrictions Precautions Precautions: Fall Precaution Comments: desats, my need to use NRB   Pertinent Vitals/Pain Pt on 50% VM sats at 88-90. Placed on NRB per RN oK to switch. Pt sats at 90% with mobility. Replaced VM withnsats dropping to 86% and not coming up. RN stated to replace NRB. sats back to 92%/      Mobility  Bed Mobility Bed Mobility: Supine to Sit Supine to Sit: 4: Min assist;HOB elevated Details for Bed Mobility Assistance: pt did need  a min  assistance to get to fully upright Transfers Transfers: Sit to Stand;Stand to Sit;Stand Pivot Transfers Sit to Stand: 4: Min assist Stand to Sit: 4: Min guard Stand Pivot Transfers: 4: Min assist Details for Transfer Assistance: pt stands well.ed RW for support. Ambulation/Gait Ambulation/Gait Assistance: Not tested (comment)    Exercises     PT Diagnosis: Difficulty walking;Generalized weakness  PT Problem List: Decreased knowledge of use of DME;Decreased strength;Decreased activity tolerance;Decreased mobility;Cardiopulmonary status limiting activity;Decreased knowledge of precautions;Decreased safety  awareness PT Treatment Interventions: DME instruction;Gait training;Functional mobility training;Therapeutic activities;Therapeutic exercise;Patient/family education   PT Goals Acute Rehab PT Goals PT Goal Formulation: With patient Time For Goal Achievement: 04/24/13 Potential to Achieve Goals: Good Pt will go Supine/Side to Sit: Independently PT Goal: Supine/Side to Sit - Progress: Goal set today Pt will go Sit to Supine/Side: Independently PT Goal: Sit to Supine/Side - Progress: Goal set today Pt will go Sit to Stand: Independently PT Goal: Sit to Stand - Progress: Goal set today Pt will go Stand to Sit: Independently PT Goal: Stand to Sit - Progress: Goal set today Pt will Ambulate: >150 feet;with supervision;with least restrictive assistive device PT Goal: Ambulate - Progress: Goal set today Pt will Perform Home Exercise Program: Independently PT Goal: Perform Home Exercise Program - Progress: Goal set today  Visit Information  Last PT Received On: 04/10/13 Assistance Needed: +1 (+ 2 to walk)    Subjective Data  Subjective: well, it is keeping me alive. 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, advertising Arousal/Alertness: Awake/alert Behavior During Therapy: WFL for tasks assessed/performed Overall Cognitive Status: Within Functional Limits for tasks assessed    Extremity/Trunk Assessment Right Upper Extremity Assessment RUE ROM/Strength/Tone: [facility] for tasks assessed Left Upper Extremity Assessment  LUE  ROM/Strength/Tone: [facility] for tasks assessed Right Lower Extremity Assessment RLE ROM/Strength/Tone: [facility] for tasks assessed Left Lower Extremity Assessment LLE ROM/Strength/Tone: [facility] for tasks assessed Trunk Assessment Trunk Assessment: Normal   Balance    End of Session PT - End of Session Activity Tolerance: Treatment limited secondary to medical complications (Comment) Patient left: in chair;with call bell/phone within reach Nurse Communication: Mobility status  GP     Rada Hay 04/10/2013, 2:31 PM  Blanchard Kelch PT [phone]

## 2013-04-10 NOTE — Progress Notes (Signed)
[facility] PROGRESS NOTE  Robert Dixon WJX:[id number] DOB: May 15, 1941 DOA: 04/04/2013 PCP: Thora Lance, MD  Assessment/Plan: 1. Respiratory Failure - Pt has required Bipap while in house as such will keep patient in step down for prn bipap - Reportedly has done well overnight without the bipap machine.  Still requiring supplemental oxygen - 2ary to PNA  2. CAP - strep urine antigen negative - + urine antigen for legionella - Continue Azithromycin today is day 7 (Depending on hospital course will aim for a prolonged antibiotic regimen of 10-14 days) Will D/C rocephin given positive urine legionella antigen. - CXR 5/14 reported and interval improvement in L lung consolidation.  3. Hypokalemia - replace orally and reassess next am - K level 2.9, will replace orally with k dur 40 meq po bid and reassess next am.  4. ARF - Creatinine within normal limits at 1.05 on last check 5/14 - has resolved.  5. Leukocytosis - 2ary to # 2 - has resolved with improvement in condition and IV antibiotics  6. Conjunctivitis  - At right eye discussed with ophthalmologist at this point most likely related to infectious etiology given # 2 - Place on fluoroquinolone product and very much appreciate pharmacy's assistance.   Code Status: full Family Communication: Discussed with patient and son at bedside.  Disposition Plan: Pending continued improvement in condition.  If patient does not require bipap and oxygen saturations steady will plan on transitioning to floor (most likely within the next 1-2 days).    Consultants:  none  Procedures:  Bipap  Antibiotics:  Azithro day 7  Rocephin >>>5/15  HPI/Subjective: Patient feeling better. Complaining of right eye tearing and bothering patient.  No acute issues reported overnight.  Objective: Filed Vitals:   04/10/13 0745 04/10/13 0800 04/10/13 0849 04/10/13 1200  BP: 118/71     Pulse: 93     Temp:  98 F (36.7 C)  98.3 F  (36.8 C)  TempSrc:  Axillary  Axillary  Resp: 19     Height:      Weight:      SpO2: 94%  97%     Intake/Output Summary (Last 24 hours) at 04/10/13 1234 Last data filed at 04/10/13 0745  Gross per 24 hour  Intake    270 ml  Output   1650 ml  Net  -1380 ml   Filed Weights   04/04/13 1756 04/04/13 2105 04/09/13 0400  Weight: 84.1 kg (185 lb 6.5 oz) 85.1 kg (187 lb 9.8 oz) 81.6 kg (179 lb 14.3 oz)    Exam:   General:  Pt in NAD, Alert and Awake  Eye: right eye with conjunctival erythema and injected. No pain with EOM  Cardiovascular: N S1 and S2, no murmurs  Respiratory: Rhales at left lung field, diminished breath sounds at left lung when compared to right. No wheezes,  Breath sounds heard BL  Abdomen: soft, obese, NT  Musculoskeletal: no cyanosis or clubbing   Data Reviewed: Basic Metabolic Panel:  Recent Labs Lab 04/04/13 1150 04/04/13 2112 04/05/13 0356 04/06/13 0403 04/07/13 0341 04/08/13 0345 04/09/13 0317 04/10/13 0745  NA 136 138 135 135 133* 135 136  --   K 3.8 3.2* 3.1* 3.6 3.2* 3.4* 3.0* 2.9*  CL 100 105 102 102 102 99 99  --   CO2 21 20 21 22 24 28 30   --   GLUCOSE 138* 129* 110* 104* 101* 95 87  --   BUN 26* 28* 26* 22 19 17 14   --  CREATININE 1.43* 1.48* 1.42* 1.24 1.15 1.12 1.05  --   CALCIUM 8.5 8.1* 7.9* 8.1* 7.9* 7.9* 7.6*  --   MG  --  1.9  --   --   --   --   --   --    Liver Function Tests:  Recent Labs Lab 04/04/13 1150 04/04/13 2112  AST 50* 59*  ALT 25 29  ALKPHOS 88 81  BILITOT 1.1 0.6  PROT 6.6 5.9*  ALBUMIN 3.1* 2.6*   No results found for this basename: LIPASE, AMYLASE,  in the last 168 hours No results found for this basename: AMMONIA,  in the last 168 hours CBC:  Recent Labs Lab 04/04/13 1150  04/05/13 0356 04/06/13 0403 04/07/13 0341 04/08/13 0345 04/09/13 0317  WBC 16.3*  < > 12.2* 9.7 8.9 7.7 6.4  NEUTROABS 14.5*  --   --   --   --   --   --   HGB 12.6*  < > 11.6* 11.1* 11.1* 10.6* 10.4*  HCT 36.4*   < > 33.2* 32.0* 31.6* 30.4* 30.3*  MCV 92.9  < > 93.3 92.5 92.4 93.0 93.2  PLT 235  < > 217 230 238 247 309  < > = values in this interval not displayed. Cardiac Enzymes:  Recent Labs Lab 04/04/13 2112 04/05/13 0356 04/05/13 0910  TROPONINI <0.30 <0.30 <0.30   BNP (last 3 results) No results found for this basename: PROBNP,  in the last 8760 hours CBG: No results found for this basename: GLUCAP,  in the last 168 hours  Recent Results (from the past 240 hour(s))  CULTURE, BLOOD (ROUTINE X 2)     Status: None   Collection Time    04/04/13 11:50 AM      Result Value Range Status   Specimen Description BLOOD LEFT ARM  5 ML IN [facility] BOTTLE   Final   Special Requests NONE   Final   Culture  Setup Time 04/04/2013 17:56   Final   Culture     Final   Value:        BLOOD CULTURE RECEIVED NO GROWTH TO DATE CULTURE WILL BE HELD FOR 5 DAYS BEFORE ISSUING A FINAL NEGATIVE REPORT   Report Status PENDING   Incomplete  CULTURE, BLOOD (ROUTINE X 2)     Status: None   Collection Time    04/04/13 12:00 PM      Result Value Range Status   Specimen Description BLOOD RIGHT FOREARM  8 ML IN [facility] BOTTLE   Final   Special Requests NONE   Final   Culture  Setup Time 04/04/2013 17:56   Final   Culture     Final   Value:        BLOOD CULTURE RECEIVED NO GROWTH TO DATE CULTURE WILL BE HELD FOR 5 DAYS BEFORE ISSUING A FINAL NEGATIVE REPORT   Report Status PENDING   Incomplete  URINE CULTURE     Status: None   Collection Time    04/04/13  1:31 PM      Result Value Range Status   Specimen Description URINE, CLEAN CATCH   Final   Special Requests NONE   Final   Culture  Setup Time 04/05/2013 00:31   Final   Colony Count NO GROWTH   Final   Culture NO GROWTH   Final   Report Status 04/06/2013 FINAL   Final  CULTURE, EXPECTORATED SPUTUM-ASSESSMENT     Status: None   Collection Time  04/04/13  7:33 PM      Result Value Range Status   Specimen Description SPUTUM   Final   Special Requests NONE    Final   Sputum evaluation     Final   Value: THIS SPECIMEN IS ACCEPTABLE. RESPIRATORY CULTURE REPORT TO FOLLOW.   Report Status 04/04/2013 FINAL   Final  CULTURE, RESPIRATORY (NON-EXPECTORATED)     Status: None   Collection Time    04/04/13  7:33 PM      Result Value Range Status   Specimen Description SPUTUM   Final   Special Requests NONE   Final   Gram Stain     Final   Value: MODERATE WBC PRESENT,BOTH PMN AND MONONUCLEAR     NO SQUAMOUS EPITHELIAL CELLS SEEN     RARE GRAM POSITIVE RODS     RARE GRAM POSITIVE COCCI     IN PAIRS RARE YEAST   Culture NORMAL OROPHARYNGEAL FLORA   Final   Report Status 04/07/2013 FINAL   Final  MRSA PCR SCREENING     Status: None   Collection Time    04/04/13  9:40 PM      Result Value Range Status   MRSA by PCR NEGATIVE  NEGATIVE Final   Comment:            The GeneXpert MRSA Assay (FDA     approved for NASAL specimens     only), is one component of a     comprehensive MRSA colonization     surveillance program. It is not     intended to diagnose MRSA     infection nor to guide or     monitor treatment for     MRSA infections.     Studies: Dg Chest Port 1 View  04/09/2013   *RADIOLOGY REPORT*  Clinical Data: Pneumonia  PORTABLE CHEST - 1 VIEW  Comparison: 04/04/2013  Findings: The diffuse airspace disease in the peripheral aspect the left mid and lower lung persists although the peripheral aspect of the lung laterally appears better aerated.  Interstitial markings are more prominent than previously and both lungs. The cardiopericardial silhouette is within normal limits for size. Imaged bony structures of the thorax are intact. Telemetry leads overlie the chest.  IMPRESSION: Slight interval improvement in the left lung consolidation.   Original Report Authenticated By: Kennith Center, M.D.    Scheduled Meds: . albuterol  2.5 mg Nebulization Q4H  . antiseptic oral rinse  15 mL Mouth Rinse q12n4p  . azithromycin  500 mg Oral Daily  .  chlorhexidine  15 mL Mouth Rinse BID  . enoxaparin (LOVENOX) injection  40 mg Subcutaneous Q24H  . gatifloxacin  1 drop Right Eye Q2H while awake   Followed by  . [START ON 04/11/2013] gatifloxacin  1 drop Right Eye QID  . ipratropium  0.5 mg Nebulization Q4H  . oxybutynin  10 mg Oral Daily  . pantoprazole  40 mg Oral Daily  . potassium chloride  40 mEq Oral BID  . vitamin C  500 mg Oral Daily   Continuous Infusions:   Active Problems:   CAP (community acquired pneumonia)   Leukocytosis, unspecified   HTN (hypertension)   ARF (acute renal failure)   Hypokalemia   Acute respiratory failure   Conjunctivitis unspecified    Time spent: > 40 minutes.    Penny Pia  [facility] Pager [phone] If 7PM-7AM, please contact night-coverage at [email or web address], password [email or web address] 04/10/2013, 12:34 PM  LOS: 6 days

## 2013-04-10 NOTE — Progress Notes (Signed)
CARE MANAGEMENT NOTE 04/10/2013  Patient:  IBRAHIM, MCPHEETERS   Account Number:  [id number]  Date Initiated:  04/09/2013  Documentation initiated by:  Jemari Hallum  Subjective/Objective Assessment:   pneumonia with resp failure muliple medical problems, continues on biapa     Action/Plan:   tbd/ has been living at home with spouse   Anticipated DC Date:  04/13/2013   Anticipated DC Plan:  HOME/SELF CARE         Choice offered to / List presented to:             Status of service:  In process, will continue to follow Medicare Important Message given?  NO (If response is "NO", the following Medicare IM given date fields will be blank) Date Medicare IM given:   Date Additional Medicare IM given:    Discharge Disposition:    Per UR Regulation:  Reviewed for med. necessity/level of care/duration of stay  If discussed at Long Length of Stay Meetings, dates discussed:   04/10/2013    Comments:  [id number] Earlene Plater, RN, BSN, CCM:  CHART REVIEWED AND UPDATED.  Next chart review due on [id number]. NO DISCHARGE NEEDS PRESENT AT THIS TIME.  Will follow for needs and plan of care.  Discussed at the los meeting. CASE MANAGEMENT [phone]   [id number] Earlene Plater, RN, BSN, CCM:  CHART REVIEWED AND UPDATED.  Next chart review due on [id number]. NO DISCHARGE NEEDS PRESENT AT THIS TIME. CASE MANAGEMENT [phone]

## 2013-04-10 NOTE — Progress Notes (Signed)
PHARMACIST - PHYSICIAN COMMUNICATION DR:   Cena Benton CONCERNING: Antibiotic IV to Oral Route Change Policy  RECOMMENDATION: This patient is receiving Azithromycin by the intravenous route.  Based on criteria approved by the Pharmacy and Therapeutics Committee, the antibiotic(s) is/are being converted to the equivalent oral dose form(s).   DESCRIPTION: These criteria include:  Patient being treated for a respiratory tract infection, urinary tract infection, or cellulitis  The patient is not neutropenic and does not exhibit a GI malabsorption state  The patient is eating (either orally or via tube) and/or has been taking other orally administered medications for a least 24 hours  The patient is improving clinically and has a Tmax < 100.5  If you have questions about this conversion, please contact the Pharmacy Department  [x]   [phone] )  [facility] PharmD, [place] Pager [phone] 04/10/2013 9:48 AM

## 2013-04-11 LAB — BASIC METABOLIC PANEL
BUN: 11 mg/dL (ref 6–23)
CO2: 29 mEq/L (ref 19–32)
Calcium: 8 mg/dL — ABNORMAL LOW (ref 8.4–10.5)
Chloride: 101 mEq/L (ref 96–112)
Creatinine, Ser: 0.86 mg/dL (ref 0.50–1.35)
GFR calc Af Amer: >90 mL/min (ref >90)
GFR calc non Af Amer: 85 mL/min — ABNORMAL LOW (ref >90)
Glucose, Bld: 100 mg/dL — ABNORMAL HIGH (ref 70–99)
Potassium: 3.5 mEq/L (ref 3.5–5.1)
Sodium: 139 mEq/L (ref 135–145)

## 2013-04-11 MED ORDER — ALPRAZOLAM 0.25 MG PO TABS: 0.2500 mg | ORAL_TABLET | Freq: Every evening | ORAL | Status: DC | PRN

## 2013-04-11 NOTE — Progress Notes (Signed)
[facility] PROGRESS NOTE  Robert Dixon ZOX:[id number] DOB: 1941-04-29 DOA: 04/04/2013 PCP: Thora Lance, MD  Assessment/Plan: 1. Respiratory Failure - Reportedly has done well overnight without the bipap machine.  Still requiring supplemental oxygen - 2ary to PNA - Recommended increase in ambulation as tolerated today.  2. CAP - strep urine antigen negative - + urine antigen for legionella - Continue Azithromycin today is day 8 (Depending on hospital course will aim for a prolonged antibiotic regimen of 10-14 days) Will D/C rocephin given positive urine legionella antigen. - CXR 5/14 reported and interval improvement in L lung consolidation.  3. Hypokalemia - resolved with oral replacement  4. ARF - Creatinine within normal limits - has resolved.  5. Leukocytosis - 2ary to # 2 - has resolved with improvement in condition and IV antibiotics  6. Conjunctivitis  - At right eye, discussed with ophthalmologist at this point most likely related to infectious etiology given # 2 - Placed on fluoroquinolone eye drops and very much appreciate pharmacy's assistance.   Code Status: full Family Communication: Discussed with patient and son   Disposition Plan: To telemetry floor   Consultants:  none  Procedures:  Bipap  Antibiotics:  Azithro day 7  Rocephin >>>5/15  HPI/Subjective: Patient feeling better. Reports improvement in right eye.  No acute issues overnight.  Objective: Filed Vitals:   04/11/13 0900 04/11/13 1000 04/11/13 1100 04/11/13 1116  BP:      Pulse: 101 95 91   Temp:      TempSrc:      Resp: 21 17 18    Height:      Weight:      SpO2: 91% 92% 92% 94%    Intake/Output Summary (Last 24 hours) at 04/11/13 1236 Last data filed at 04/11/13 0900  Gross per 24 hour  Intake    360 ml  Output   1750 ml  Net  -1390 ml   Filed Weights   04/04/13 1756 04/04/13 2105 04/09/13 0400  Weight: 84.1 kg (185 lb 6.5 oz) 85.1 kg (187 lb 9.8 oz) 81.6  kg (179 lb 14.3 oz)    Exam:   General:  Pt in NAD, Alert and Awake  Eye: right eye with conjunctival erythema and injected. No pain with EOM  Cardiovascular: N S1 and S2, no murmurs  Respiratory: Rhales at left lung field improved,  No wheezes,  Breath sounds heard BL  Abdomen: soft, obese, NT  Musculoskeletal: no cyanosis or clubbing   Data Reviewed: Basic Metabolic Panel:  Recent Labs Lab 04/04/13 2112  04/06/13 0403 04/07/13 0341 04/08/13 0345 04/09/13 0317 04/10/13 0745 04/11/13 0322  NA 138  < > 135 133* 135 136  --  139  K 3.2*  < > 3.6 3.2* 3.4* 3.0* 2.9* 3.5  CL 105  < > 102 102 99 99  --  101  CO2 20  < > 22 24 28 30   --  29  GLUCOSE 129*  < > 104* 101* 95 87  --  100*  BUN 28*  < > 22 19 17 14   --  11  CREATININE 1.48*  < > 1.24 1.15 1.12 1.05  --  0.86  CALCIUM 8.1*  < > 8.1* 7.9* 7.9* 7.6*  --  8.0*  MG 1.9  --   --   --   --   --   --   --   < > = values in this interval not displayed. Liver Function Tests:  Recent Labs  Lab 04/04/13 2112  AST 59*  ALT 29  ALKPHOS 81  BILITOT 0.6  PROT 5.9*  ALBUMIN 2.6*   No results found for this basename: LIPASE, AMYLASE,  in the last 168 hours No results found for this basename: AMMONIA,  in the last 168 hours CBC:  Recent Labs Lab 04/05/13 0356 04/06/13 0403 04/07/13 0341 04/08/13 0345 04/09/13 0317  WBC 12.2* 9.7 8.9 7.7 6.4  HGB 11.6* 11.1* 11.1* 10.6* 10.4*  HCT 33.2* 32.0* 31.6* 30.4* 30.3*  MCV 93.3 92.5 92.4 93.0 93.2  PLT 217 230 238 247 309   Cardiac Enzymes:  Recent Labs Lab 04/04/13 2112 04/05/13 0356 04/05/13 0910  TROPONINI <0.30 <0.30 <0.30   BNP (last 3 results) No results found for this basename: PROBNP,  in the last 8760 hours CBG: No results found for this basename: GLUCAP,  in the last 168 hours  Recent Results (from the past 240 hour(s))  CULTURE, BLOOD (ROUTINE X 2)     Status: None   Collection Time    04/04/13 11:50 AM      Result Value Range Status    Specimen Description BLOOD LEFT ARM  5 ML IN [facility] BOTTLE   Final   Special Requests NONE   Final   Culture  Setup Time 04/04/2013 17:56   Final   Culture NO GROWTH 5 DAYS   Final   Report Status 04/10/2013 FINAL   Final  CULTURE, BLOOD (ROUTINE X 2)     Status: None   Collection Time    04/04/13 12:00 PM      Result Value Range Status   Specimen Description BLOOD RIGHT FOREARM  8 ML IN [facility] BOTTLE   Final   Special Requests NONE   Final   Culture  Setup Time 04/04/2013 17:56   Final   Culture NO GROWTH 5 DAYS   Final   Report Status 04/10/2013 FINAL   Final  URINE CULTURE     Status: None   Collection Time    04/04/13  1:31 PM      Result Value Range Status   Specimen Description URINE, CLEAN CATCH   Final   Special Requests NONE   Final   Culture  Setup Time 04/05/2013 00:31   Final   Colony Count NO GROWTH   Final   Culture NO GROWTH   Final   Report Status 04/06/2013 FINAL   Final  CULTURE, EXPECTORATED SPUTUM-ASSESSMENT     Status: None   Collection Time    04/04/13  7:33 PM      Result Value Range Status   Specimen Description SPUTUM   Final   Special Requests NONE   Final   Sputum evaluation     Final   Value: THIS SPECIMEN IS ACCEPTABLE. RESPIRATORY CULTURE REPORT TO FOLLOW.   Report Status 04/04/2013 FINAL   Final  CULTURE, RESPIRATORY (NON-EXPECTORATED)     Status: None   Collection Time    04/04/13  7:33 PM      Result Value Range Status   Specimen Description SPUTUM   Final   Special Requests NONE   Final   Gram Stain     Final   Value: MODERATE WBC PRESENT,BOTH PMN AND MONONUCLEAR     NO SQUAMOUS EPITHELIAL CELLS SEEN     RARE GRAM POSITIVE RODS     RARE GRAM POSITIVE COCCI     IN PAIRS RARE YEAST   Culture NORMAL OROPHARYNGEAL FLORA   Final   Report  Status 04/07/2013 FINAL   Final  MRSA PCR SCREENING     Status: None   Collection Time    04/04/13  9:40 PM      Result Value Range Status   MRSA by PCR NEGATIVE  NEGATIVE Final   Comment:            The  GeneXpert MRSA Assay (FDA     approved for NASAL specimens     only), is one component of a     comprehensive MRSA colonization     surveillance program. It is not     intended to diagnose MRSA     infection nor to guide or     monitor treatment for     MRSA infections.     Studies: No results found.  Scheduled Meds: . albuterol  2.5 mg Nebulization Q4H  . antiseptic oral rinse  15 mL Mouth Rinse q12n4p  . azithromycin  500 mg Oral Daily  . chlorhexidine  15 mL Mouth Rinse BID  . enoxaparin (LOVENOX) injection  40 mg Subcutaneous Q24H  . gatifloxacin  1 drop Right Eye QID  . ipratropium  0.5 mg Nebulization Q4H  . oxybutynin  10 mg Oral Daily  . pantoprazole  40 mg Oral Daily  . potassium chloride  40 mEq Oral BID  . vitamin C  500 mg Oral Daily   Continuous Infusions:   Active Problems:   CAP (community acquired pneumonia)   Leukocytosis, unspecified   HTN (hypertension)   ARF (acute renal failure)   Hypokalemia   Acute respiratory failure   Conjunctivitis unspecified    Time spent: > 35 minutes.    Penny Pia  [facility] Pager [phone] If 7PM-7AM, please contact night-coverage at [email or web address], password [facility] 04/11/2013, 12:36 PM  LOS: 7 days

## 2013-04-11 NOTE — Clinical Social Work Placement (Addendum)
Clinical Social Work Department CLINICAL SOCIAL WORK PLACEMENT NOTE 04/11/2013  Patient:  TALAL, FRITCHMAN  Account Number:  [id number] Admit date:  04/04/2013  Clinical Social Worker:  Jodelle Red  Date/time:  04/11/2013 10:50 AM  Clinical Social Work is seeking post-discharge placement for this patient at the following level of care:   SKILLED NURSING   (*CSW will update this form in Epic as items are completed)   04/11/2013  Patient/family provided with [facility] Department of Clinical Social Work's list of facilities offering this level of care within the geographic area requested by the patient (or if unable, by the patient's family).  04/11/2013  Patient/family informed of their freedom to choose among providers that offer the needed level of care, that participate in Medicare, Medicaid or managed care program needed by the patient, have an available bed and are willing to accept the patient.  04/11/2013  Patient/family informed of [facility]' ownership interest in [facility], as well as of the fact that they are under no obligation to receive care at this facility.  PASARR submitted to EDS on 04/11/2013 PASARR number received from EDS on   FL2 transmitted to all facilities in geographic area requested by pt/family on  04/11/2013 FL2 transmitted to all facilities within larger geographic area on   Patient informed that his/her managed care company has contracts with or will negotiate with  certain facilities, including the following:     Patient/family informed of bed offers received:   Patient chooses bed at  Physician recommends and patient chooses bed at    Patient to be transferred to  on   Patient to be transferred to facility by   The following physician request were entered in Epic:   Additional Comments: Doreen Salvage, LCSW ICU/Stepdown Clinical Social Worker [facility] Cell [phone] Hours 8am-1200pm  M-F   04/14/13-Patient decided to return home with [facility] services.

## 2013-04-11 NOTE — Clinical Social Work Psychosocial (Signed)
Clinical Social Work Department BRIEF PSYCHOSOCIAL ASSESSMENT 04/11/2013  Patient:  Robert Dixon, Robert Dixon     Account Number:  [id number]     Admit date:  04/04/2013  Clinical Social Worker:  Jodelle Red  Date/Time:  04/11/2013 10:39 AM  Referred by:  CSW  Date Referred:  04/11/2013 Referred for  SNF Placement   Other Referral:   CSW reviewed PT note that reports Pt may need SNF.   Interview type:  Patient Other interview type:    PSYCHOSOCIAL DATA Living Status:  WIFE Admitted from facility:   Level of care:   Primary support name:  Majorie Gidley Primary support relationship to patient:  SPOUSE Degree of support available:   good from wife and son.    CURRENT CONCERNS Current Concerns  Post-Acute Placement   Other Concerns:   possible need for SNF    SOCIAL WORK ASSESSMENT / PLAN CSW had been reviewing Pt's info all week while in ICU and thought the plan was for Pt to return home. Pt reports to live with his wife and has son that works during the day. CSW discussed PT eval and Pt is open to SNF placement as an option. Pt has not walked with PT to date and will need to for SNF. CSW provided SNF list and discussed process for SNF placement. Pt will have his wife at home with him and family checking daily.   Assessment/plan status:  Psychosocial Support/Ongoing Assessment of Needs Other assessment/ plan:   possible SNf   Information/referral to community resources:   [facility] list    PATIENT'S/FAMILY'S RESPONSE TO PLAN OF CARE: Pt and son are aware and agree to ST SNF if it continues to be recommended by PT. CSW to begin SNF search and follow for [facility] placement.    Doreen Salvage, LCSW ICU/Stepdown Clinical Social Worker [facility] Cell [phone] Hours 8am-1200pm M-F

## 2013-04-12 NOTE — Progress Notes (Signed)
Report received from T.Smith,RN. No change in assessment. Will continue plan of care.Robert Dixon 

## 2013-04-12 NOTE — Progress Notes (Signed)
[facility] PROGRESS NOTE  Robert Dixon:[id number] DOB: May 12, 1941 DOA: 04/04/2013 PCP: Thora Lance, MD  Assessment/Plan: 1. Respiratory Failure - Reportedly has done well overnight without the bipap machine.  Still requiring supplemental oxygen - 2ary to PNA - Recommended increase in ambulation as tolerated. - Patient has had slow recovery and still necessitating 6 L supplemental oxygen.  Will monitor over the weekend. Most likely will have to discharge on supplemental oxygen with close follow up with his pcp.  2. CAP - strep urine antigen negative - + urine antigen for legionella - Continue Azithromycin today is day 9 (Depending on hospital course will aim for a prolonged antibiotic regimen of 10-14 days) Will D/C rocephin given positive urine legionella antigen. - CXR 5/14 reported and interval improvement in L lung consolidation.  3. Hypokalemia - resolved with oral replacement  4. ARF - Creatinine within normal limits - has resolved.  5. Leukocytosis - 2ary to # 2 - has resolved with improvement in condition and abx's - recheck next am 5/18  6. Conjunctivitis  - At right eye, discussed with ophthalmologist at this point most likely related to infectious etiology given # 2 - Placed on fluoroquinolone eye drops and very much appreciate pharmacy's assistance. Improved on this regimen.   Code Status: full Family Communication: Discussed with patient and son   Disposition Plan: To telemetry floor   Consultants:  none  Procedures:  Bipap  Antibiotics:  Azithro day 9  Rocephin >>>5/15  HPI/Subjective: Patient feeling better. Reports improvement in right eye.  No acute issues overnight, has been trying to ambulate more  Objective: Filed Vitals:   04/11/13 2308 04/12/13 0700 04/12/13 0809 04/12/13 1352  BP: 138/66 134/82  123/54  Pulse: 90 89  81  Temp: 98 F (36.7 C) 97.6 F (36.4 C)  98.1 F (36.7 C)  TempSrc: Oral Oral  Oral  Resp: 18  20  20   Height:      Weight:      SpO2: 99% 94% 91% 95%    Intake/Output Summary (Last 24 hours) at 04/12/13 1401 Last data filed at 04/12/13 1238  Gross per 24 hour  Intake    720 ml  Output   1000 ml  Net   -280 ml   Filed Weights   04/04/13 2105 04/09/13 0400 04/11/13 1646  Weight: 85.1 kg (187 lb 9.8 oz) 81.6 kg (179 lb 14.3 oz) 80.241 kg (176 lb 14.4 oz)    Exam:   General:  Pt in NAD, Alert and Awake  Eye: right eye with conjunctival erythema and injected. No pain with EOM  Cardiovascular: N S1 and S2, no murmurs  Respiratory: Rhales at left lung field improved,  No wheezes,  Breath sounds heard BL  Abdomen: soft, obese, NT  Musculoskeletal: no cyanosis or clubbing   Data Reviewed: Basic Metabolic Panel:  Recent Labs Lab 04/06/13 0403 04/07/13 0341 04/08/13 0345 04/09/13 0317 04/10/13 0745 04/11/13 0322  NA 135 133* 135 136  --  139  K 3.6 3.2* 3.4* 3.0* 2.9* 3.5  CL 102 102 99 99  --  101  CO2 22 24 28 30   --  29  GLUCOSE 104* 101* 95 87  --  100*  BUN 22 19 17 14   --  11  CREATININE 1.24 1.15 1.12 1.05  --  0.86  CALCIUM 8.1* 7.9* 7.9* 7.6*  --  8.0*   Liver Function Tests: No results found for this basename: AST, ALT, ALKPHOS, BILITOT, PROT, ALBUMIN,  in the last 168 hours No results found for this basename: LIPASE, AMYLASE,  in the last 168 hours No results found for this basename: AMMONIA,  in the last 168 hours CBC:  Recent Labs Lab 04/06/13 0403 04/07/13 0341 04/08/13 0345 04/09/13 0317  WBC 9.7 8.9 7.7 6.4  HGB 11.1* 11.1* 10.6* 10.4*  HCT 32.0* 31.6* 30.4* 30.3*  MCV 92.5 92.4 93.0 93.2  PLT 230 238 247 309   Cardiac Enzymes: No results found for this basename: CKTOTAL, CKMB, CKMBINDEX, TROPONINI,  in the last 168 hours BNP (last 3 results) No results found for this basename: PROBNP,  in the last 8760 hours CBG: No results found for this basename: GLUCAP,  in the last 168 hours  Recent Results (from the past 240 hour(s))   CULTURE, BLOOD (ROUTINE X 2)     Status: None   Collection Time    04/04/13 11:50 AM      Result Value Range Status   Specimen Description BLOOD LEFT ARM  5 ML IN [facility] BOTTLE   Final   Special Requests NONE   Final   Culture  Setup Time 04/04/2013 17:56   Final   Culture NO GROWTH 5 DAYS   Final   Report Status 04/10/2013 FINAL   Final  CULTURE, BLOOD (ROUTINE X 2)     Status: None   Collection Time    04/04/13 12:00 PM      Result Value Range Status   Specimen Description BLOOD RIGHT FOREARM  8 ML IN [facility] BOTTLE   Final   Special Requests NONE   Final   Culture  Setup Time 04/04/2013 17:56   Final   Culture NO GROWTH 5 DAYS   Final   Report Status 04/10/2013 FINAL   Final  URINE CULTURE     Status: None   Collection Time    04/04/13  1:31 PM      Result Value Range Status   Specimen Description URINE, CLEAN CATCH   Final   Special Requests NONE   Final   Culture  Setup Time 04/05/2013 00:31   Final   Colony Count NO GROWTH   Final   Culture NO GROWTH   Final   Report Status 04/06/2013 FINAL   Final  CULTURE, EXPECTORATED SPUTUM-ASSESSMENT     Status: None   Collection Time    04/04/13  7:33 PM      Result Value Range Status   Specimen Description SPUTUM   Final   Special Requests NONE   Final   Sputum evaluation     Final   Value: THIS SPECIMEN IS ACCEPTABLE. RESPIRATORY CULTURE REPORT TO FOLLOW.   Report Status 04/04/2013 FINAL   Final  CULTURE, RESPIRATORY (NON-EXPECTORATED)     Status: None   Collection Time    04/04/13  7:33 PM      Result Value Range Status   Specimen Description SPUTUM   Final   Special Requests NONE   Final   Gram Stain     Final   Value: MODERATE WBC PRESENT,BOTH PMN AND MONONUCLEAR     NO SQUAMOUS EPITHELIAL CELLS SEEN     RARE GRAM POSITIVE RODS     RARE GRAM POSITIVE COCCI     IN PAIRS RARE YEAST   Culture NORMAL OROPHARYNGEAL FLORA   Final   Report Status 04/07/2013 FINAL   Final  MRSA PCR SCREENING     Status: None   Collection Time     04/04/13  9:40 PM      Result Value Range Status   MRSA by PCR NEGATIVE  NEGATIVE Final   Comment:            The GeneXpert MRSA Assay (FDA     approved for NASAL specimens     only), is one component of a     comprehensive MRSA colonization     surveillance program. It is not     intended to diagnose MRSA     infection nor to guide or     monitor treatment for     MRSA infections.     Studies: No results found.  Scheduled Meds: . albuterol  2.5 mg Nebulization Q4H  . antiseptic oral rinse  15 mL Mouth Rinse q12n4p  . azithromycin  500 mg Oral Daily  . chlorhexidine  15 mL Mouth Rinse BID  . enoxaparin (LOVENOX) injection  40 mg Subcutaneous Q24H  . gatifloxacin  1 drop Right Eye QID  . ipratropium  0.5 mg Nebulization Q4H  . oxybutynin  10 mg Oral Daily  . pantoprazole  40 mg Oral Daily  . vitamin C  500 mg Oral Daily   Continuous Infusions:   Active Problems:   CAP (community acquired pneumonia)   Leukocytosis, unspecified   HTN (hypertension)   ARF (acute renal failure)   Hypokalemia   Acute respiratory failure   Conjunctivitis unspecified    Time spent: > 35 minutes.    Penny Pia  [facility] Pager [phone] If 7PM-7AM, please contact night-coverage at [email or web address], password [facility] 04/12/2013, 2:01 PM  LOS: 8 days

## 2013-04-12 NOTE — Progress Notes (Signed)
Provided Pt and wife with bed offers.  Pt and wife to review.  Weekday CSW to follow.  Providence Crosby, LCSWA Clinical Social Work [phone]

## 2013-04-13 LAB — CBC
HCT: 34 % — ABNORMAL LOW (ref 39.0–52.0)
Hemoglobin: 11.1 g/dL — ABNORMAL LOW (ref 13.0–17.0)
MCH: 30.6 pg (ref 26.0–34.0)
MCHC: 32.6 g/dL (ref 30.0–36.0)
MCV: 93.7 fL (ref 78.0–100.0)
Platelets: 445 10*3/uL — ABNORMAL HIGH (ref 150–400)
RBC: 3.63 MIL/uL — ABNORMAL LOW (ref 4.22–5.81)
RDW: 13 % (ref 11.5–15.5)
WBC: 6.2 10*3/uL (ref 4.0–10.5)

## 2013-04-13 MED ORDER — IPRATROPIUM BROMIDE 0.02 % IN SOLN
0.5000 mg | Freq: Four times a day (QID) | RESPIRATORY_TRACT | Status: DC
  Administered 2013-04-13 – 2013-04-14 (×4): 0.5 mg via RESPIRATORY_TRACT
  Filled 2013-04-13 (×4): qty 2.5

## 2013-04-13 MED ORDER — ALBUTEROL SULFATE (5 MG/ML) 0.5% IN NEBU
2.5000 mg | INHALATION_SOLUTION | Freq: Four times a day (QID) | RESPIRATORY_TRACT | Status: DC
  Administered 2013-04-13 – 2013-04-14 (×4): 2.5 mg via RESPIRATORY_TRACT
  Filled 2013-04-13 (×4): qty 0.5

## 2013-04-13 NOTE — Progress Notes (Signed)
Pt thinks he has an "ear infection" in his l ear.  Denies pain, no redness or drainage noted but pt says his ear is sounds "bubbly" or "crackly" to him.

## 2013-04-13 NOTE — Progress Notes (Signed)
Pt remains incontinent - of large amounts of urine.  Have placed a condom cath x2 this shift and each time it has fallen off.  But pt has had QS amount of urine

## 2013-04-13 NOTE — Progress Notes (Signed)
[facility] PROGRESS NOTE  Robert Dixon UJW:[id number] DOB: 02/15/1941 DOA: 04/04/2013 PCP: Thora Lance, MD  Assessment/Plan: 1. Respiratory Failure - 2ary to PNA from legionella - slow improvement on azithromycin.   - continue supplemental o2 and wean to room air as tolerated.  2. CAP - strep urine antigen negative - + urine antigen for legionella - Continue Azithromycin today is day 10 (Depending on hospital course will aim for a prolonged antibiotic regimen of 10-14 days) Will D/C rocephin given positive urine legionella antigen. - CXR 5/14 reported and interval improvement in L lung consolidation.  3. Hypokalemia - resolved with oral replacement  4. ARF - Creatinine within normal limits - has resolved.  5. Leukocytosis - 2ary to # 2 - has resolved with improvement in condition and abx's - recheck next am 5/18  6. Conjunctivitis  - At right eye, discussed with ophthalmologist at this point most likely related to infectious etiology given # 2 - Placed on fluoroquinolone eye drops and very much appreciate pharmacy's assistance. Improved on this regimen.   Code Status: full Family Communication: Discussed with patient and son   Disposition Plan: To telemetry floor   Consultants:  none  Procedures:  Bipap  Antibiotics:  Azithro day 9  Rocephin >>>5/15  HPI/Subjective: Patient feeling better. Reports improvement in right eye.  No acute issues overnight, has been trying to ambulate more  Objective: Filed Vitals:   04/13/13 0520 04/13/13 1113 04/13/13 1141 04/13/13 1500  BP: 137/70   138/58  Pulse: 77   86  Temp: 97.7 F (36.5 C)   97.5 F (36.4 C)  TempSrc: Oral   Oral  Resp: 18   20  Height:      Weight:      SpO2: 96% 91% 91% 96%    Intake/Output Summary (Last 24 hours) at 04/13/13 1805 Last data filed at 04/13/13 1500  Gross per 24 hour  Intake   1440 ml  Output   2052 ml  Net   -612 ml   Filed Weights   04/04/13 2105 04/09/13  0400 04/11/13 1646  Weight: 85.1 kg (187 lb 9.8 oz) 81.6 kg (179 lb 14.3 oz) 80.241 kg (176 lb 14.4 oz)    Exam:   General:  Pt in NAD, Alert and Awake  Eye: right eye with conjunctival erythema and injected. No pain with EOM  Cardiovascular: N S1 and S2, no murmurs  Respiratory: Rhales at left lung field improved,  No wheezes,  Breath sounds heard BL  Abdomen: soft, obese, NT  Musculoskeletal: no cyanosis or clubbing   Data Reviewed: Basic Metabolic Panel:  Recent Labs Lab 04/07/13 0341 04/08/13 0345 04/09/13 0317 04/10/13 0745 04/11/13 0322  NA 133* 135 136  --  139  K 3.2* 3.4* 3.0* 2.9* 3.5  CL 102 99 99  --  101  CO2 24 28 30   --  29  GLUCOSE 101* 95 87  --  100*  BUN 19 17 14   --  11  CREATININE 1.15 1.12 1.05  --  0.86  CALCIUM 7.9* 7.9* 7.6*  --  8.0*   Liver Function Tests: No results found for this basename: AST, ALT, ALKPHOS, BILITOT, PROT, ALBUMIN,  in the last 168 hours No results found for this basename: LIPASE, AMYLASE,  in the last 168 hours No results found for this basename: AMMONIA,  in the last 168 hours CBC:  Recent Labs Lab 04/07/13 0341 04/08/13 0345 04/09/13 0317 04/13/13 0544  WBC 8.9 7.7 6.4  6.2  HGB 11.1* 10.6* 10.4* 11.1*  HCT 31.6* 30.4* 30.3* 34.0*  MCV 92.4 93.0 93.2 93.7  PLT 238 247 309 445*   Cardiac Enzymes: No results found for this basename: CKTOTAL, CKMB, CKMBINDEX, TROPONINI,  in the last 168 hours BNP (last 3 results) No results found for this basename: PROBNP,  in the last 8760 hours CBG: No results found for this basename: GLUCAP,  in the last 168 hours  Recent Results (from the past 240 hour(s))  CULTURE, BLOOD (ROUTINE X 2)     Status: None   Collection Time    04/04/13 11:50 AM      Result Value Range Status   Specimen Description BLOOD LEFT ARM  5 ML IN [facility] BOTTLE   Final   Special Requests NONE   Final   Culture  Setup Time 04/04/2013 17:56   Final   Culture NO GROWTH 5 DAYS   Final   Report Status  04/10/2013 FINAL   Final  CULTURE, BLOOD (ROUTINE X 2)     Status: None   Collection Time    04/04/13 12:00 PM      Result Value Range Status   Specimen Description BLOOD RIGHT FOREARM  8 ML IN [facility] BOTTLE   Final   Special Requests NONE   Final   Culture  Setup Time 04/04/2013 17:56   Final   Culture NO GROWTH 5 DAYS   Final   Report Status 04/10/2013 FINAL   Final  URINE CULTURE     Status: None   Collection Time    04/04/13  1:31 PM      Result Value Range Status   Specimen Description URINE, CLEAN CATCH   Final   Special Requests NONE   Final   Culture  Setup Time 04/05/2013 00:31   Final   Colony Count NO GROWTH   Final   Culture NO GROWTH   Final   Report Status 04/06/2013 FINAL   Final  CULTURE, EXPECTORATED SPUTUM-ASSESSMENT     Status: None   Collection Time    04/04/13  7:33 PM      Result Value Range Status   Specimen Description SPUTUM   Final   Special Requests NONE   Final   Sputum evaluation     Final   Value: THIS SPECIMEN IS ACCEPTABLE. RESPIRATORY CULTURE REPORT TO FOLLOW.   Report Status 04/04/2013 FINAL   Final  CULTURE, RESPIRATORY (NON-EXPECTORATED)     Status: None   Collection Time    04/04/13  7:33 PM      Result Value Range Status   Specimen Description SPUTUM   Final   Special Requests NONE   Final   Gram Stain     Final   Value: MODERATE WBC PRESENT,BOTH PMN AND MONONUCLEAR     NO SQUAMOUS EPITHELIAL CELLS SEEN     RARE GRAM POSITIVE RODS     RARE GRAM POSITIVE COCCI     IN PAIRS RARE YEAST   Culture NORMAL OROPHARYNGEAL FLORA   Final   Report Status 04/07/2013 FINAL   Final  MRSA PCR SCREENING     Status: None   Collection Time    04/04/13  9:40 PM      Result Value Range Status   MRSA by PCR NEGATIVE  NEGATIVE Final   Comment:            The GeneXpert MRSA Assay (FDA     approved for NASAL specimens  only), is one component of a     comprehensive MRSA colonization     surveillance program. It is not     intended to diagnose MRSA      infection nor to guide or     monitor treatment for     MRSA infections.     Studies: No results found.  Scheduled Meds: . albuterol  2.5 mg Nebulization QID  . antiseptic oral rinse  15 mL Mouth Rinse q12n4p  . azithromycin  500 mg Oral Daily  . chlorhexidine  15 mL Mouth Rinse BID  . enoxaparin (LOVENOX) injection  40 mg Subcutaneous Q24H  . gatifloxacin  1 drop Right Eye QID  . ipratropium  0.5 mg Nebulization QID  . oxybutynin  10 mg Oral Daily  . pantoprazole  40 mg Oral Daily  . vitamin C  500 mg Oral Daily   Continuous Infusions:   Active Problems:   CAP (community acquired pneumonia)   Leukocytosis, unspecified   HTN (hypertension)   ARF (acute renal failure)   Hypokalemia   Acute respiratory failure   Conjunctivitis unspecified    Time spent: > 35 minutes.    Penny Pia  [facility] Pager [phone] If 7PM-7AM, please contact night-coverage at [email or web address], password [facility] 04/13/2013, 6:05 PM  LOS: 9 days

## 2013-04-14 MED ORDER — ALBUTEROL SULFATE (5 MG/ML) 0.5% IN NEBU: 2.5000 mg | INHALATION_SOLUTION | RESPIRATORY_TRACT | Status: DC | PRN

## 2013-04-14 MED ORDER — AZITHROMYCIN 500 MG PO TABS: 500.0000 mg | ORAL_TABLET | Freq: Every day | ORAL | Status: DC

## 2013-04-14 MED ORDER — GATIFLOXACIN 0.5 % OP SOLN: 1.0000 [drp] | Freq: Four times a day (QID) | OPHTHALMIC | Status: AC

## 2013-04-14 MED ORDER — AMLODIPINE BESYLATE 5 MG PO TABS: 5.0000 mg | ORAL_TABLET | Freq: Every day | ORAL | Status: DC

## 2013-04-14 NOTE — Progress Notes (Signed)
Physical Therapy Treatment Patient Details Name: Robert Dixon MRN: [id number] DOB: 1941-04-22 Today's Date: 04/14/2013 Time: 4403-4742 PT Time Calculation (min): 25 min  PT Assessment / Plan / Recommendation Comments on Treatment Session  Pt ambulated in hallway with RW and SaO2 92% on 3L O2.  Pt also performed exercises with rest breaks as needed.  Pt reports son visiting SNFs today and likely to d/c later today.    Follow Up Recommendations  SNF     Does the patient have the potential to tolerate intense rehabilitation     Barriers to Discharge        Equipment Recommendations  None recommended by PT    Recommendations for Other Services    Frequency     Plan Discharge plan remains appropriate;Frequency remains appropriate    Precautions / Restrictions Precautions Precautions: Fall Precaution Comments: monitor sats   Pertinent Vitals/Pain See below, no pain    Mobility  Bed Mobility Bed Mobility: Sit to Supine Sit to Supine: 5: Supervision;HOB elevated Transfers Transfers: Sit to Stand;Stand to Sit Sit to Stand: 5: Supervision;With upper extremity assist;From chair/3-in-1 Stand to Sit: 5: Supervision;With upper extremity assist;To chair/3-in-1;To bed Ambulation/Gait Ambulation/Gait Assistance: 4: Min guard Ambulation Distance (Feet): 150 Feet Assistive device: Rolling walker Ambulation/Gait Assistance Details: verbal cues for using RW safely, SaO2 92% on 3L O2 during ambulation Gait Pattern: Step-through pattern;Decreased stride length Gait velocity: decreased    Exercises General Exercises - Lower Extremity Long Arc Quad: AROM;Both;15 reps;Seated Hip ABduction/ADduction: AROM;Both;15 reps;Supine Straight Leg Raises: AROM;Both;15 reps;Supine Hip Flexion/Marching: AROM;Seated;Both;15 reps   PT Diagnosis:    PT Problem List:   PT Treatment Interventions:     PT Goals Acute Rehab PT Goals PT Goal: Sit to Supine/Side - Progress: Progressing toward goal PT  Goal: Sit to Stand - Progress: Progressing toward goal PT Goal: Stand to Sit - Progress: Progressing toward goal PT Goal: Ambulate - Progress: Progressing toward goal PT Goal: Perform Home Exercise Program - Progress: Progressing toward goal  Visit Information  Last PT Received On: 04/14/13 Assistance Needed: +1    Subjective Data  Subjective: Whatever I need to do to get better.   Cognition  Cognition Arousal/Alertness: Awake/alert Behavior During Therapy: WFL for tasks assessed/performed Overall Cognitive Status: Within Functional Limits for tasks assessed    Balance     End of Session PT - End of Session Equipment Utilized During Treatment: Oxygen Patient left: in bed;with call bell/phone within reach;with family/visitor present   GP     Elizzie Westergard,KATHrine E 04/14/2013, 10:47 AM Zenovia Jarred, PT, DPT 04/14/2013 Pager: [phone]

## 2013-04-14 NOTE — Progress Notes (Signed)
Pt and son chose [facility] to provide [facility] services, referral made.  Algernon Huxley RN BSN   [phone]

## 2013-04-14 NOTE — Progress Notes (Signed)
Patient oxygen saturation was 96% on room air at rest and 98% on room air during ambulation.  Will continue to monitor.

## 2013-04-14 NOTE — Progress Notes (Signed)
Clinical Social Work  Per MD, patient can DC to SNF today. CSW met with patient at bedside who has bed offers and reports that his son is touring faciltiies today to make a decision. CSW called son Raiford Noble) who reports he is currently SNFs. Son aware of DC today and reports he will call CSW once he has made a decision. CSW will continue to follow.  Unk Lightning, LCSW (Coverage for [employer])

## 2013-04-14 NOTE — Progress Notes (Signed)
Clinical Social Work  Patient and son have decided to DC home with [facility] services. Per chart review, CM is aware. CSW is signing off but available if further needs arise.  Unk Lightning, LCSW  (Coverage for [employer])

## 2013-04-14 NOTE — Discharge Summary (Addendum)
Physician Discharge Summary  Robert Dixon ZOX:[id number] DOB: [birth year] DOA: 04/04/2013  PCP: Thora Lance, MD  Admit date: 04/04/2013 Discharge date: 04/14/2013  Time spent: > 35 minutes  Recommendations for Outpatient Follow-up:  1. Needs to f/u with his ophthalmologist. Appointment already set up 2. Needs reevaluation while at SNF to decide if patient will require a longer course of antibiotics 3. Also wean to room air as tolerated  Discharge Diagnoses:  Active Problems:   CAP (community acquired pneumonia)   Leukocytosis, unspecified   HTN (hypertension)   ARF (acute renal failure)   Hypokalemia   Acute respiratory failure   Conjunctivitis unspecified   Discharge Condition: Stable   Diet recommendation: Cardiac/low sodium  Filed Weights   04/04/13 2105 04/09/13 0400 04/11/13 1646  Weight: 85.1 kg (187 lb 9.8 oz) 81.6 kg (179 lb 14.3 oz) 80.241 kg (176 lb 14.4 oz)    History of present illness:  From original HPI: Patient is a pleasant 72 year old white man with history only significant for hypertension. 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 At that point he also felt a little dizzy. He continued to feel worse and progressed to have body aches. He had subjective fevers and chills however did not actually take his temperature. He decided to come to the hospital today because of progressive cough and weakness. He is found to have a large left pneumonia. He is febrile to 101.3. We have been asked to admit him for further evaluation and management.  Hospital Course:  1. Respiratory Failure - 2ary to PNA from legionella  - slow improvement on azithromycin.  - continue supplemental o2 and wean to room air as tolerated.  - Treat for a total treatment course of 15 days with azithromycin  2. CAP  - strep urine antigen negative  - + urine antigen for legionella  - Continue Azithromycin today is day 10  (Depending on hospital course will aim for a prolonged antibiotic regimen of 10-14 days) Will D/C rocephin given positive urine legionella antigen.  - CXR 5/14 reported and interval improvement in L lung consolidation.   3. Hypokalemia  - resolved with oral replacement   4. ARF  - Creatinine within normal limits  - has resolved.   5. Leukocytosis  - 2ary to # 2  - has resolved with improvement in condition and abx's    6. Conjunctivitis  - At right eye, discussed with ophthalmologist at this point most likely related to infectious etiology given # 2  - Placed on fluoroquinolone eye drops and very much appreciate pharmacy's assistance. Improved on this regimen - Pt has f/u scheduled with his ophthalmologist. Please see below.   Procedures:  Required cpap initially  Consultations:  None  Discharge Exam: Filed Vitals:   04/13/13 1945 04/13/13 2209 04/14/13 0513 04/14/13 0909  BP:  151/70 152/66   Pulse:  87 71   Temp:  97.8 F (36.6 C) 98.1 F (36.7 C)   TempSrc:  Oral Oral   Resp:  20 16   Height:      Weight:      SpO2: 94% 98% 98% 98%    General: Pt in NAD, Alert and Awake Cardiovascular: RRR, no mrg Respiratory: Rhales at L lung base, no wheezes, breathing comfortably on 3 L O2 via supplementation via [place]  Discharge Instructions  Discharge Orders   Future Orders Complete By Expires     Call MD for:  difficulty breathing, headache or visual disturbances  As directed     Call MD for:  extreme fatigue  As directed     Call MD for:  temperature >100.4  As directed     Diet - low sodium heart healthy  As directed     Increase activity slowly  As directed         Medication List    STOP taking these medications       atenolol 25 MG tablet  Commonly known as:  TENORMIN     benazepril 20 MG tablet  Commonly known as:  LOTENSIN     losartan 100 MG tablet  Commonly known as:  COZAAR      TAKE these medications       albuterol (5 MG/ML) 0.5% nebulizer  solution  Commonly known as:  PROVENTIL  Take 0.5 mLs (2.5 mg total) by nebulization every 4 (four) hours as needed for wheezing or shortness of breath.     amLODipine 5 MG tablet  Commonly known as:  NORVASC  Take 1 tablet (5 mg total) by mouth daily.     azithromycin 500 MG tablet  Commonly known as:  ZITHROMAX  Take 1 tablet (500 mg total) by mouth daily.     gatifloxacin 0.5 % Soln  Commonly known as:  ZYMAXID  Place 1 drop into the right eye 4 (four) times daily.     omeprazole 40 MG capsule  Commonly known as:  PRILOSEC  Take 40 mg by mouth 2 (two) times daily.     oxybutynin 10 MG 24 hr tablet  Commonly known as:  DITROPAN-XL  Take 10 mg by mouth daily.     vitamin C 500 MG tablet  Commonly known as:  ASCORBIC ACID  Take 500 mg by mouth daily.       No Known Allergies    The results of significant diagnostics from this hospitalization (including imaging, microbiology, ancillary and laboratory) are listed below for reference.    Significant Diagnostic Studies: Dg Chest 2 View  04/04/2013   *RADIOLOGY REPORT*  Clinical Data: Fever, weakness.  CHEST - 2 VIEW  Comparison: None.  Findings: Cardiomediastinal silhouette appears normal.  Large alveolar opacity is seen laterally in the left upper lobe consistent with pneumonia.  Right lung is clear.  No pleural effusion or pneumothorax is noted.  IMPRESSION: Large left upper lobe pneumonia.   Original Report Authenticated By: Lupita Raider.,  M.D.   Dg Chest Port 1 View  04/09/2013   *RADIOLOGY REPORT*  Clinical Data: Pneumonia  PORTABLE CHEST - 1 VIEW  Comparison: 04/04/2013  Findings: The diffuse airspace disease in the peripheral aspect the left mid and lower lung persists although the peripheral aspect of the lung laterally appears better aerated.  Interstitial markings are more prominent than previously and both lungs. The cardiopericardial silhouette is within normal limits for size. Imaged bony structures of the thorax  are intact. Telemetry leads overlie the chest.  IMPRESSION: Slight interval improvement in the left lung consolidation.   Original Report Authenticated By: Kennith Center, M.D.   Dg Chest Port 1 View  04/04/2013   *RADIOLOGY REPORT*  Clinical Data: Shortness of breath, decreased oxygen saturation  PORTABLE CHEST - 1 VIEW  Comparison: Portable exam 2053 hours compared to 1222 hours  Findings: Normal heart size and pulmonary vascularity. Slightly prominent right paratracheal soft tissues unchanged. Atherosclerotic calcification aorta. Extensive airspace consolidation left lung consistent with pneumonia. Mild atelectasis right base. No gross  pleural effusion or pneumothorax. Bones demineralized.  IMPRESSION: Extensive left lung pneumonia, unchanged.   Original Report Authenticated By: Ulyses Southward, M.D.    Microbiology: Recent Results (from the past 240 hour(s))  CULTURE, BLOOD (ROUTINE X 2)     Status: None   Collection Time    04/04/13 11:50 AM      Result Value Range Status   Specimen Description BLOOD LEFT ARM  5 ML IN [facility] BOTTLE   Final   Special Requests NONE   Final   Culture  Setup Time 04/04/2013 17:56   Final   Culture NO GROWTH 5 DAYS   Final   Report Status 04/10/2013 FINAL   Final  CULTURE, BLOOD (ROUTINE X 2)     Status: None   Collection Time    04/04/13 12:00 PM      Result Value Range Status   Specimen Description BLOOD RIGHT FOREARM  8 ML IN [facility] BOTTLE   Final   Special Requests NONE   Final   Culture  Setup Time 04/04/2013 17:56   Final   Culture NO GROWTH 5 DAYS   Final   Report Status 04/10/2013 FINAL   Final  URINE CULTURE     Status: None   Collection Time    04/04/13  1:31 PM      Result Value Range Status   Specimen Description URINE, CLEAN CATCH   Final   Special Requests NONE   Final   Culture  Setup Time 04/05/2013 00:31   Final   Colony Count NO GROWTH   Final   Culture NO GROWTH   Final   Report Status 04/06/2013 FINAL   Final  CULTURE, EXPECTORATED  SPUTUM-ASSESSMENT     Status: None   Collection Time    04/04/13  7:33 PM      Result Value Range Status   Specimen Description SPUTUM   Final   Special Requests NONE   Final   Sputum evaluation     Final   Value: THIS SPECIMEN IS ACCEPTABLE. RESPIRATORY CULTURE REPORT TO FOLLOW.   Report Status 04/04/2013 FINAL   Final  CULTURE, RESPIRATORY (NON-EXPECTORATED)     Status: None   Collection Time    04/04/13  7:33 PM      Result Value Range Status   Specimen Description SPUTUM   Final   Special Requests NONE   Final   Gram Stain     Final   Value: MODERATE WBC PRESENT,BOTH PMN AND MONONUCLEAR     NO SQUAMOUS EPITHELIAL CELLS SEEN     RARE GRAM POSITIVE RODS     RARE GRAM POSITIVE COCCI     IN PAIRS RARE YEAST   Culture NORMAL OROPHARYNGEAL FLORA   Final   Report Status 04/07/2013 FINAL   Final  MRSA PCR SCREENING     Status: None   Collection Time    04/04/13  9:40 PM      Result Value Range Status   MRSA by PCR NEGATIVE  NEGATIVE Final   Comment:            The GeneXpert MRSA Assay (FDA     approved for NASAL specimens     only), is one component of a     comprehensive MRSA colonization     surveillance program. It is not     intended to diagnose MRSA     infection nor to guide or     monitor treatment for     MRSA  infections.     Labs: Basic Metabolic Panel:  Recent Labs Lab 04/08/13 0345 04/09/13 0317 04/10/13 0745 04/11/13 0322  NA 135 136  --  139  K 3.4* 3.0* 2.9* 3.5  CL 99 99  --  101  CO2 28 30  --  29  GLUCOSE 95 87  --  100*  BUN 17 14  --  11  CREATININE 1.12 1.05  --  0.86  CALCIUM 7.9* 7.6*  --  8.0*   Liver Function Tests: No results found for this basename: AST, ALT, ALKPHOS, BILITOT, PROT, ALBUMIN,  in the last 168 hours No results found for this basename: LIPASE, AMYLASE,  in the last 168 hours No results found for this basename: AMMONIA,  in the last 168 hours CBC:  Recent Labs Lab 04/08/13 0345 04/09/13 0317 04/13/13 0544  WBC 7.7  6.4 6.2  HGB 10.6* 10.4* 11.1*  HCT 30.4* 30.3* 34.0*  MCV 93.0 93.2 93.7  PLT 247 309 445*   Cardiac Enzymes: No results found for this basename: CKTOTAL, CKMB, CKMBINDEX, TROPONINI,  in the last 168 hours BNP: BNP (last 3 results) No results found for this basename: PROBNP,  in the last 8760 hours CBG: No results found for this basename: GLUCAP,  in the last 168 hours     Signed:  Penny Pia  [facility] 04/14/2013, 10:47 AM  Addendum: At this point patient wishes to go home with home health despite there recommendations from physical therapy.  Patient is alert and Oriented and able to make his own medical decisions.   We will set up home health with PT/Nursing/Respiratory care.

## 2013-05-20 ENCOUNTER — Ambulatory Visit: Payer: 59 | Attending: Family Medicine | Admitting: Physical Therapy

## 2013-05-20 DIAGNOSIS — M25519 Pain in unspecified shoulder: Secondary | ICD-10-CM | POA: Insufficient documentation

## 2013-05-20 DIAGNOSIS — IMO0001 Reserved for inherently not codable concepts without codable children: Secondary | ICD-10-CM | POA: Insufficient documentation

## 2013-05-26 ENCOUNTER — Ambulatory Visit: Payer: 59 | Admitting: Physical Therapy

## 2013-05-29 ENCOUNTER — Ambulatory Visit: Payer: 59 | Attending: Family Medicine | Admitting: Physical Therapy

## 2013-05-29 DIAGNOSIS — M25519 Pain in unspecified shoulder: Secondary | ICD-10-CM | POA: Insufficient documentation

## 2013-05-29 DIAGNOSIS — IMO0001 Reserved for inherently not codable concepts without codable children: Secondary | ICD-10-CM | POA: Insufficient documentation

## 2013-06-04 ENCOUNTER — Ambulatory Visit: Payer: 59 | Admitting: Rehabilitation

## 2013-06-11 ENCOUNTER — Ambulatory Visit: Payer: 59 | Admitting: Physical Therapy

## 2013-06-18 ENCOUNTER — Ambulatory Visit: Payer: 59 | Admitting: Physical Therapy

## 2014-02-10 ENCOUNTER — Other Ambulatory Visit (HOSPITAL_COMMUNITY): Payer: 59

## 2014-02-10 ENCOUNTER — Ambulatory Visit: Payer: 59 | Admitting: Interventional Cardiology

## 2014-12-04 DIAGNOSIS — L570 Actinic keratosis: Secondary | ICD-10-CM | POA: Diagnosis not present

## 2015-01-18 DIAGNOSIS — H02112 Cicatricial ectropion of right lower eyelid: Secondary | ICD-10-CM | POA: Diagnosis not present

## 2015-01-18 DIAGNOSIS — H019 Unspecified inflammation of eyelid: Secondary | ICD-10-CM | POA: Diagnosis not present

## 2015-01-18 DIAGNOSIS — D2311 Other benign neoplasm of skin of right eyelid, including canthus: Secondary | ICD-10-CM | POA: Diagnosis not present

## 2015-01-18 DIAGNOSIS — H04562 Stenosis of left lacrimal punctum: Secondary | ICD-10-CM | POA: Diagnosis not present

## 2015-01-18 DIAGNOSIS — D2211 Melanocytic nevi of right eyelid, including canthus: Secondary | ICD-10-CM | POA: Diagnosis not present

## 2015-01-18 DIAGNOSIS — H02012 Cicatricial entropion of right lower eyelid: Secondary | ICD-10-CM | POA: Diagnosis not present

## 2015-01-18 DIAGNOSIS — H11242 Scarring of conjunctiva, left eye: Secondary | ICD-10-CM | POA: Diagnosis not present

## 2015-01-18 DIAGNOSIS — H11002 Unspecified pterygium of left eye: Secondary | ICD-10-CM | POA: Diagnosis not present

## 2015-01-18 DIAGNOSIS — H11243 Scarring of conjunctiva, bilateral: Secondary | ICD-10-CM | POA: Diagnosis not present

## 2015-01-18 DIAGNOSIS — H11433 Conjunctival hyperemia, bilateral: Secondary | ICD-10-CM | POA: Diagnosis not present

## 2015-01-18 DIAGNOSIS — D485 Neoplasm of uncertain behavior of skin: Secondary | ICD-10-CM | POA: Diagnosis not present

## 2015-01-18 DIAGNOSIS — D481 Neoplasm of uncertain behavior of connective and other soft tissue: Secondary | ICD-10-CM | POA: Diagnosis not present

## 2015-01-18 DIAGNOSIS — H02115 Cicatricial ectropion of left lower eyelid: Secondary | ICD-10-CM | POA: Diagnosis not present

## 2015-04-02 DIAGNOSIS — L570 Actinic keratosis: Secondary | ICD-10-CM | POA: Diagnosis not present

## 2015-05-07 DIAGNOSIS — K219 Gastro-esophageal reflux disease without esophagitis: Secondary | ICD-10-CM | POA: Diagnosis not present

## 2015-05-07 DIAGNOSIS — I10 Essential (primary) hypertension: Secondary | ICD-10-CM | POA: Diagnosis not present

## 2015-05-17 DIAGNOSIS — H11431 Conjunctival hyperemia, right eye: Secondary | ICD-10-CM | POA: Diagnosis not present

## 2015-05-17 DIAGNOSIS — L905 Scar conditions and fibrosis of skin: Secondary | ICD-10-CM | POA: Diagnosis not present

## 2015-05-17 DIAGNOSIS — H04562 Stenosis of left lacrimal punctum: Secondary | ICD-10-CM | POA: Diagnosis not present

## 2015-06-04 DIAGNOSIS — L608 Other nail disorders: Secondary | ICD-10-CM | POA: Diagnosis not present

## 2015-09-10 DIAGNOSIS — L814 Other melanin hyperpigmentation: Secondary | ICD-10-CM | POA: Diagnosis not present

## 2015-09-10 DIAGNOSIS — L821 Other seborrheic keratosis: Secondary | ICD-10-CM | POA: Diagnosis not present

## 2015-09-10 DIAGNOSIS — L570 Actinic keratosis: Secondary | ICD-10-CM | POA: Diagnosis not present

## 2015-11-08 DIAGNOSIS — Z1389 Encounter for screening for other disorder: Secondary | ICD-10-CM | POA: Diagnosis not present

## 2015-11-08 DIAGNOSIS — I10 Essential (primary) hypertension: Secondary | ICD-10-CM | POA: Diagnosis not present

## 2015-11-08 DIAGNOSIS — K219 Gastro-esophageal reflux disease without esophagitis: Secondary | ICD-10-CM | POA: Diagnosis not present

## 2016-02-08 DIAGNOSIS — J019 Acute sinusitis, unspecified: Secondary | ICD-10-CM | POA: Diagnosis not present

## 2016-03-10 DIAGNOSIS — L309 Dermatitis, unspecified: Secondary | ICD-10-CM | POA: Diagnosis not present

## 2016-03-10 DIAGNOSIS — B078 Other viral warts: Secondary | ICD-10-CM | POA: Diagnosis not present

## 2016-03-10 DIAGNOSIS — L570 Actinic keratosis: Secondary | ICD-10-CM | POA: Diagnosis not present

## 2016-03-27 DIAGNOSIS — H01003 Unspecified blepharitis right eye, unspecified eyelid: Secondary | ICD-10-CM | POA: Diagnosis not present

## 2016-03-27 DIAGNOSIS — H01006 Unspecified blepharitis left eye, unspecified eyelid: Secondary | ICD-10-CM | POA: Diagnosis not present

## 2016-04-27 DIAGNOSIS — H01006 Unspecified blepharitis left eye, unspecified eyelid: Secondary | ICD-10-CM | POA: Diagnosis not present

## 2016-04-27 DIAGNOSIS — H01003 Unspecified blepharitis right eye, unspecified eyelid: Secondary | ICD-10-CM | POA: Diagnosis not present

## 2016-08-15 DIAGNOSIS — H524 Presbyopia: Secondary | ICD-10-CM | POA: Diagnosis not present

## 2016-08-15 DIAGNOSIS — H5210 Myopia, unspecified eye: Secondary | ICD-10-CM | POA: Diagnosis not present

## 2016-09-08 DIAGNOSIS — L219 Seborrheic dermatitis, unspecified: Secondary | ICD-10-CM | POA: Diagnosis not present

## 2016-09-08 DIAGNOSIS — D485 Neoplasm of uncertain behavior of skin: Secondary | ICD-10-CM | POA: Diagnosis not present

## 2016-09-08 DIAGNOSIS — C44629 Squamous cell carcinoma of skin of left upper limb, including shoulder: Secondary | ICD-10-CM | POA: Diagnosis not present

## 2016-09-08 DIAGNOSIS — L570 Actinic keratosis: Secondary | ICD-10-CM | POA: Diagnosis not present

## 2016-09-13 DIAGNOSIS — D0462 Carcinoma in situ of skin of left upper limb, including shoulder: Secondary | ICD-10-CM | POA: Diagnosis not present

## 2016-09-13 DIAGNOSIS — D0461 Carcinoma in situ of skin of right upper limb, including shoulder: Secondary | ICD-10-CM | POA: Diagnosis not present

## 2016-11-15 DIAGNOSIS — Z85828 Personal history of other malignant neoplasm of skin: Secondary | ICD-10-CM | POA: Diagnosis not present

## 2016-11-15 DIAGNOSIS — L905 Scar conditions and fibrosis of skin: Secondary | ICD-10-CM | POA: Diagnosis not present

## 2016-12-20 DIAGNOSIS — K219 Gastro-esophageal reflux disease without esophagitis: Secondary | ICD-10-CM | POA: Diagnosis not present

## 2016-12-20 DIAGNOSIS — I10 Essential (primary) hypertension: Secondary | ICD-10-CM | POA: Diagnosis not present

## 2016-12-20 DIAGNOSIS — K644 Residual hemorrhoidal skin tags: Secondary | ICD-10-CM | POA: Diagnosis not present

## 2016-12-20 DIAGNOSIS — M545 Low back pain: Secondary | ICD-10-CM | POA: Diagnosis not present

## 2016-12-20 DIAGNOSIS — Z0000 Encounter for general adult medical examination without abnormal findings: Secondary | ICD-10-CM | POA: Diagnosis not present

## 2017-02-14 DIAGNOSIS — L905 Scar conditions and fibrosis of skin: Secondary | ICD-10-CM | POA: Diagnosis not present

## 2017-02-14 DIAGNOSIS — Z85828 Personal history of other malignant neoplasm of skin: Secondary | ICD-10-CM | POA: Diagnosis not present

## 2017-02-14 DIAGNOSIS — L570 Actinic keratosis: Secondary | ICD-10-CM | POA: Diagnosis not present

## 2017-08-22 DIAGNOSIS — H01009 Unspecified blepharitis unspecified eye, unspecified eyelid: Secondary | ICD-10-CM | POA: Diagnosis not present

## 2017-08-22 DIAGNOSIS — H524 Presbyopia: Secondary | ICD-10-CM | POA: Diagnosis not present

## 2017-08-22 DIAGNOSIS — H02135 Senile ectropion of left lower eyelid: Secondary | ICD-10-CM | POA: Diagnosis not present

## 2017-08-22 DIAGNOSIS — H2513 Age-related nuclear cataract, bilateral: Secondary | ICD-10-CM | POA: Diagnosis not present

## 2017-08-22 DIAGNOSIS — H52223 Regular astigmatism, bilateral: Secondary | ICD-10-CM | POA: Diagnosis not present

## 2017-09-13 DIAGNOSIS — H02883 Meibomian gland dysfunction of right eye, unspecified eyelid: Secondary | ICD-10-CM | POA: Diagnosis not present

## 2017-09-13 DIAGNOSIS — H02886 Meibomian gland dysfunction of left eye, unspecified eyelid: Secondary | ICD-10-CM | POA: Diagnosis not present

## 2017-10-29 DIAGNOSIS — M79672 Pain in left foot: Secondary | ICD-10-CM | POA: Diagnosis not present

## 2018-01-01 DIAGNOSIS — B356 Tinea cruris: Secondary | ICD-10-CM | POA: Diagnosis not present

## 2018-01-01 DIAGNOSIS — D225 Melanocytic nevi of trunk: Secondary | ICD-10-CM | POA: Diagnosis not present

## 2018-01-01 DIAGNOSIS — L570 Actinic keratosis: Secondary | ICD-10-CM | POA: Diagnosis not present

## 2018-01-01 DIAGNOSIS — L821 Other seborrheic keratosis: Secondary | ICD-10-CM | POA: Diagnosis not present

## 2018-01-01 DIAGNOSIS — Z85828 Personal history of other malignant neoplasm of skin: Secondary | ICD-10-CM | POA: Diagnosis not present

## 2018-01-01 DIAGNOSIS — L218 Other seborrheic dermatitis: Secondary | ICD-10-CM | POA: Diagnosis not present

## 2018-01-11 DIAGNOSIS — Z13220 Encounter for screening for lipoid disorders: Secondary | ICD-10-CM | POA: Diagnosis not present

## 2018-01-11 DIAGNOSIS — K219 Gastro-esophageal reflux disease without esophagitis: Secondary | ICD-10-CM | POA: Diagnosis not present

## 2018-01-11 DIAGNOSIS — Z0000 Encounter for general adult medical examination without abnormal findings: Secondary | ICD-10-CM | POA: Diagnosis not present

## 2018-01-11 DIAGNOSIS — N529 Male erectile dysfunction, unspecified: Secondary | ICD-10-CM | POA: Diagnosis not present

## 2018-01-11 DIAGNOSIS — D692 Other nonthrombocytopenic purpura: Secondary | ICD-10-CM | POA: Diagnosis not present

## 2018-01-11 DIAGNOSIS — Z1389 Encounter for screening for other disorder: Secondary | ICD-10-CM | POA: Diagnosis not present

## 2018-01-11 DIAGNOSIS — Z136 Encounter for screening for cardiovascular disorders: Secondary | ICD-10-CM | POA: Diagnosis not present

## 2018-01-11 DIAGNOSIS — I10 Essential (primary) hypertension: Secondary | ICD-10-CM | POA: Diagnosis not present

## 2018-05-22 DIAGNOSIS — L218 Other seborrheic dermatitis: Secondary | ICD-10-CM | POA: Diagnosis not present

## 2018-05-22 DIAGNOSIS — L570 Actinic keratosis: Secondary | ICD-10-CM | POA: Diagnosis not present

## 2018-05-22 DIAGNOSIS — L578 Other skin changes due to chronic exposure to nonionizing radiation: Secondary | ICD-10-CM | POA: Diagnosis not present

## 2018-10-29 DIAGNOSIS — R05 Cough: Secondary | ICD-10-CM | POA: Diagnosis not present

## 2018-10-29 DIAGNOSIS — J9801 Acute bronchospasm: Secondary | ICD-10-CM | POA: Diagnosis not present

## 2018-11-11 ENCOUNTER — Other Ambulatory Visit: Payer: Self-pay | Admitting: Family Medicine

## 2018-11-11 ENCOUNTER — Ambulatory Visit
Admission: RE | Admit: 2018-11-11 | Discharge: 2018-11-11 | Disposition: A | Discharge status: Home / Self Care | Payer: Medicare HMO | Source: Ambulatory Visit | Attending: Family Medicine | Admitting: Family Medicine

## 2018-11-11 DIAGNOSIS — R0600 Dyspnea, unspecified: Secondary | ICD-10-CM | POA: Diagnosis not present

## 2018-11-11 DIAGNOSIS — R0902 Hypoxemia: Secondary | ICD-10-CM | POA: Diagnosis not present

## 2018-11-11 DIAGNOSIS — R918 Other nonspecific abnormal finding of lung field: Secondary | ICD-10-CM | POA: Diagnosis not present

## 2018-11-14 DIAGNOSIS — L853 Xerosis cutis: Secondary | ICD-10-CM | POA: Diagnosis not present

## 2018-11-14 DIAGNOSIS — L570 Actinic keratosis: Secondary | ICD-10-CM | POA: Diagnosis not present

## 2018-11-14 DIAGNOSIS — D225 Melanocytic nevi of trunk: Secondary | ICD-10-CM | POA: Diagnosis not present

## 2018-11-14 DIAGNOSIS — L821 Other seborrheic keratosis: Secondary | ICD-10-CM | POA: Diagnosis not present

## 2018-11-14 DIAGNOSIS — D1801 Hemangioma of skin and subcutaneous tissue: Secondary | ICD-10-CM | POA: Diagnosis not present

## 2018-11-20 DIAGNOSIS — R0902 Hypoxemia: Secondary | ICD-10-CM | POA: Diagnosis not present

## 2018-11-20 DIAGNOSIS — K769 Liver disease, unspecified: Secondary | ICD-10-CM | POA: Diagnosis not present

## 2018-11-20 DIAGNOSIS — I10 Essential (primary) hypertension: Secondary | ICD-10-CM | POA: Diagnosis not present

## 2018-11-20 DIAGNOSIS — R918 Other nonspecific abnormal finding of lung field: Secondary | ICD-10-CM | POA: Diagnosis not present

## 2018-11-20 DIAGNOSIS — C787 Secondary malignant neoplasm of liver and intrahepatic bile duct: Secondary | ICD-10-CM | POA: Diagnosis not present

## 2018-11-20 DIAGNOSIS — J189 Pneumonia, unspecified organism: Secondary | ICD-10-CM

## 2018-11-20 DIAGNOSIS — R042 Hemoptysis: Secondary | ICD-10-CM

## 2018-11-20 DIAGNOSIS — R911 Solitary pulmonary nodule: Secondary | ICD-10-CM | POA: Diagnosis not present

## 2018-11-20 DIAGNOSIS — R599 Enlarged lymph nodes, unspecified: Secondary | ICD-10-CM | POA: Diagnosis not present

## 2018-11-20 DIAGNOSIS — R05 Cough: Secondary | ICD-10-CM | POA: Diagnosis not present

## 2018-11-20 DIAGNOSIS — Z87891 Personal history of nicotine dependence: Secondary | ICD-10-CM | POA: Diagnosis not present

## 2018-11-20 DIAGNOSIS — K219 Gastro-esophageal reflux disease without esophagitis: Secondary | ICD-10-CM | POA: Diagnosis not present

## 2018-11-20 DIAGNOSIS — R590 Localized enlarged lymph nodes: Secondary | ICD-10-CM | POA: Diagnosis not present

## 2018-11-20 DIAGNOSIS — C801 Malignant (primary) neoplasm, unspecified: Secondary | ICD-10-CM | POA: Diagnosis not present

## 2018-11-20 DIAGNOSIS — J9601 Acute respiratory failure with hypoxia: Secondary | ICD-10-CM | POA: Diagnosis not present

## 2018-11-20 DIAGNOSIS — J156 Pneumonia due to other aerobic Gram-negative bacteria: Secondary | ICD-10-CM | POA: Diagnosis not present

## 2018-11-20 DIAGNOSIS — R0600 Dyspnea, unspecified: Secondary | ICD-10-CM | POA: Diagnosis not present

## 2018-11-20 DIAGNOSIS — J439 Emphysema, unspecified: Secondary | ICD-10-CM | POA: Diagnosis not present

## 2018-11-20 HISTORY — DX: Solitary pulmonary nodule: R91.1

## 2018-11-20 HISTORY — DX: Pneumonia, unspecified organism: J18.9

## 2018-11-20 HISTORY — DX: Hemoptysis: R04.2

## 2018-11-28 ENCOUNTER — Telehealth: Payer: Self-pay

## 2018-11-28 DIAGNOSIS — R911 Solitary pulmonary nodule: Secondary | ICD-10-CM | POA: Diagnosis not present

## 2018-11-28 DIAGNOSIS — J440 Chronic obstructive pulmonary disease with acute lower respiratory infection: Secondary | ICD-10-CM | POA: Diagnosis not present

## 2018-11-28 DIAGNOSIS — I10 Essential (primary) hypertension: Secondary | ICD-10-CM | POA: Diagnosis not present

## 2018-11-28 DIAGNOSIS — K219 Gastro-esophageal reflux disease without esophagitis: Secondary | ICD-10-CM | POA: Diagnosis not present

## 2018-11-28 DIAGNOSIS — Z79891 Long term (current) use of opiate analgesic: Secondary | ICD-10-CM | POA: Diagnosis not present

## 2018-11-28 DIAGNOSIS — J439 Emphysema, unspecified: Secondary | ICD-10-CM | POA: Diagnosis not present

## 2018-11-28 DIAGNOSIS — Z792 Long term (current) use of antibiotics: Secondary | ICD-10-CM | POA: Diagnosis not present

## 2018-11-28 DIAGNOSIS — Z87891 Personal history of nicotine dependence: Secondary | ICD-10-CM | POA: Diagnosis not present

## 2018-11-28 DIAGNOSIS — Z7982 Long term (current) use of aspirin: Secondary | ICD-10-CM | POA: Diagnosis not present

## 2018-11-28 DIAGNOSIS — J189 Pneumonia, unspecified organism: Secondary | ICD-10-CM | POA: Diagnosis not present

## 2018-11-28 NOTE — Telephone Encounter (Signed)
Spoke with pt's son, [name]. He states that the pt has had a CT and they have a copy of it and will be bringing to his appointment. Nothing further was needed.

## 2018-11-28 NOTE — Telephone Encounter (Signed)
Called and spoke with patients son, advised him of the message we got from Dr. Andrew Au office. Patient has been scheduled to see [name] on 12/05/18 at 3:30. Advised patients son to bring any paperwork or copies of tests that they may have. Also advised patients son to come 15 minutes early to have patient fill out appropriate paperwork. Will also call Dr. Andrew Au office to have paperwork sent over. Will route to [name] as Juluis Rainier that patient will be seeing him. Nothing further needed.

## 2018-11-28 NOTE — Telephone Encounter (Signed)
PCCM:  All I can see is a CXR. Does the patient have a CT scan available? Can we request this? If I could see a CT image we could make recommendations regarding need for possible bronchoscopy.   Thanks  CC: Jene Every, DO [facility] 11/28/2018 12:15 PM

## 2018-11-29 DIAGNOSIS — I10 Essential (primary) hypertension: Secondary | ICD-10-CM | POA: Diagnosis not present

## 2018-11-29 DIAGNOSIS — Z7982 Long term (current) use of aspirin: Secondary | ICD-10-CM | POA: Diagnosis not present

## 2018-11-29 DIAGNOSIS — K219 Gastro-esophageal reflux disease without esophagitis: Secondary | ICD-10-CM | POA: Diagnosis not present

## 2018-11-29 DIAGNOSIS — J440 Chronic obstructive pulmonary disease with acute lower respiratory infection: Secondary | ICD-10-CM | POA: Diagnosis not present

## 2018-11-29 DIAGNOSIS — Z87891 Personal history of nicotine dependence: Secondary | ICD-10-CM | POA: Diagnosis not present

## 2018-11-29 DIAGNOSIS — J189 Pneumonia, unspecified organism: Secondary | ICD-10-CM | POA: Diagnosis not present

## 2018-11-29 DIAGNOSIS — Z79891 Long term (current) use of opiate analgesic: Secondary | ICD-10-CM | POA: Diagnosis not present

## 2018-11-29 DIAGNOSIS — R911 Solitary pulmonary nodule: Secondary | ICD-10-CM | POA: Diagnosis not present

## 2018-11-29 DIAGNOSIS — Z792 Long term (current) use of antibiotics: Secondary | ICD-10-CM | POA: Diagnosis not present

## 2018-12-02 DIAGNOSIS — Z87891 Personal history of nicotine dependence: Secondary | ICD-10-CM | POA: Diagnosis not present

## 2018-12-02 DIAGNOSIS — J189 Pneumonia, unspecified organism: Secondary | ICD-10-CM | POA: Diagnosis not present

## 2018-12-02 DIAGNOSIS — Z7982 Long term (current) use of aspirin: Secondary | ICD-10-CM | POA: Diagnosis not present

## 2018-12-02 DIAGNOSIS — K219 Gastro-esophageal reflux disease without esophagitis: Secondary | ICD-10-CM | POA: Diagnosis not present

## 2018-12-02 DIAGNOSIS — Z792 Long term (current) use of antibiotics: Secondary | ICD-10-CM | POA: Diagnosis not present

## 2018-12-02 DIAGNOSIS — I10 Essential (primary) hypertension: Secondary | ICD-10-CM | POA: Diagnosis not present

## 2018-12-02 DIAGNOSIS — J440 Chronic obstructive pulmonary disease with acute lower respiratory infection: Secondary | ICD-10-CM | POA: Diagnosis not present

## 2018-12-02 DIAGNOSIS — R911 Solitary pulmonary nodule: Secondary | ICD-10-CM | POA: Diagnosis not present

## 2018-12-02 DIAGNOSIS — Z79891 Long term (current) use of opiate analgesic: Secondary | ICD-10-CM | POA: Diagnosis not present

## 2018-12-03 DIAGNOSIS — Z7982 Long term (current) use of aspirin: Secondary | ICD-10-CM | POA: Diagnosis not present

## 2018-12-03 DIAGNOSIS — Z792 Long term (current) use of antibiotics: Secondary | ICD-10-CM | POA: Diagnosis not present

## 2018-12-03 DIAGNOSIS — K219 Gastro-esophageal reflux disease without esophagitis: Secondary | ICD-10-CM | POA: Diagnosis not present

## 2018-12-03 DIAGNOSIS — Z87891 Personal history of nicotine dependence: Secondary | ICD-10-CM | POA: Diagnosis not present

## 2018-12-03 DIAGNOSIS — J189 Pneumonia, unspecified organism: Secondary | ICD-10-CM | POA: Diagnosis not present

## 2018-12-03 DIAGNOSIS — Z79891 Long term (current) use of opiate analgesic: Secondary | ICD-10-CM | POA: Diagnosis not present

## 2018-12-03 DIAGNOSIS — J440 Chronic obstructive pulmonary disease with acute lower respiratory infection: Secondary | ICD-10-CM | POA: Diagnosis not present

## 2018-12-03 DIAGNOSIS — I10 Essential (primary) hypertension: Secondary | ICD-10-CM | POA: Diagnosis not present

## 2018-12-03 DIAGNOSIS — R911 Solitary pulmonary nodule: Secondary | ICD-10-CM | POA: Diagnosis not present

## 2018-12-05 ENCOUNTER — Encounter (HOSPITAL_COMMUNITY): Payer: Self-pay | Admitting: *Deleted

## 2018-12-05 ENCOUNTER — Encounter: Payer: Self-pay | Admitting: Pulmonary Disease

## 2018-12-05 ENCOUNTER — Other Ambulatory Visit: Payer: Self-pay

## 2018-12-05 ENCOUNTER — Ambulatory Visit: Payer: Medicare HMO | Admitting: Pulmonary Disease

## 2018-12-05 VITALS — BP 124/70 | HR 82 | Wt 180.2 lb

## 2018-12-05 DIAGNOSIS — R918 Other nonspecific abnormal finding of lung field: Secondary | ICD-10-CM | POA: Diagnosis not present

## 2018-12-05 DIAGNOSIS — R911 Solitary pulmonary nodule: Secondary | ICD-10-CM

## 2018-12-05 DIAGNOSIS — Z87891 Personal history of nicotine dependence: Secondary | ICD-10-CM

## 2018-12-05 DIAGNOSIS — R042 Hemoptysis: Secondary | ICD-10-CM

## 2018-12-05 DIAGNOSIS — J9611 Chronic respiratory failure with hypoxia: Secondary | ICD-10-CM | POA: Diagnosis not present

## 2018-12-05 MED ORDER — ALBUTEROL SULFATE HFA 108 (90 BASE) MCG/ACT IN AERS
2.0000 | INHALATION_SPRAY | Freq: Four times a day (QID) | RESPIRATORY_TRACT | 6 refills | Status: DC | PRN
Start: 2018-12-05 — End: 2020-03-31

## 2018-12-05 NOTE — Patient Instructions (Addendum)
Thank you for visiting [name] at [facility]. Today we recommend the following:  Orders Placed This Encounter  Procedures  . CBC w/Diff  . INR/PT  . Comp Met (CMET)  . PTT  . Ambulatory referral to Pulmonology   You should report to [facility] before 12 PM. Go to admitting and give them your name and date of birth. Nothing to eat past midnight tonight.  Return in about 4 weeks (around 01/02/2019).

## 2018-12-05 NOTE — Progress Notes (Signed)
Synopsis: Referred in January 2020 for abnormal CT, lung mass, mediastinal adenopathy by [name], MD  Subjective:   PATIENT ID: Robert Dixon: male DOB: [birth year], MRN: [id number]  Chief Complaint  Patient presents with  . Consult    States it started with a cough that developed into bronchitis. States he was coughing up blood. CT showed lung mass. Currently 3L of oxygen.     PMH of former smoker. He had a cough for several weeks, month treated with abx. He started coughing up blood.  Patient was taken to [facility] where he was treated for pneumonia.  He was given a steroid pack and antibiotics.  He was treated with bronchodilators while he was there.  CT scan imaging revealed a left infrahilar mass with endobronchial disease within the upper lobe as well as enlarged mediastinal adenopathy.  Patient was referred to our pulmonary office for evaluation of potential underlying lung cancer.  Past Medical History:  Diagnosis Date  . GERD (gastroesophageal reflux disease)   . Hypertension   . Migraine with visual aura      Family History  Problem Relation Age of Onset  . Cancer - Lung Mother   . Hypertension Father   . CVA Father   . Cancer - Cervical Sister      Past Surgical History:  Procedure Laterality Date  . TONSILLECTOMY    . VASECTOMY      Social History   Socioeconomic History  . Marital status: Single    Spouse name: Not on file  . Number of children: Not on file  . Years of education: Not on file  . Highest education level: Not on file  Occupational History  . Not on file  Social Needs  . Financial resource strain: Not on file  . Food insecurity:    Worry: Not on file    Inability: Not on file  . Transportation needs:    Medical: Not on file    Non-medical: Not on file  Tobacco Use  . Smoking status: Former Smoker    Types: Cigarettes  . Smokeless tobacco: Never Used  Substance and Sexual Activity  . Alcohol use:  No  . Drug use: No  . Sexual activity: Never  Lifestyle  . Physical activity:    Days per week: Not on file    Minutes per session: Not on file  . Stress: Not on file  Relationships  . Social connections:    Talks on phone: Not on file    Gets together: Not on file    Attends religious service: Not on file    Active member of club or organization: Not on file    Attends meetings of clubs or organizations: Not on file    Relationship status: Not on file  . Intimate partner violence:    Fear of current or ex partner: Not on file    Emotionally abused: Not on file    Physically abused: Not on file    Forced sexual activity: Not on file  Other Topics Concern  . Not on file  Social History Narrative  . Not on file     Allergies  Allergen Reactions  . Codeine Other (See Comments)    HYCODAN.Marland KitchenMAKES PT DIZZY BUT CAN TAKE W/ FOOD     Outpatient Medications Prior to Visit  Medication Sig Dispense Refill  . amLODipine (NORVASC) 5 MG tablet Take 1 tablet (5 mg total) by mouth daily. 30 tablet 0  .  aspirin 81 MG tablet Take 81 mg by mouth daily.    . benazepril (LOTENSIN) 10 MG tablet Take 10 mg by mouth daily.    Marland Kitchen HYDROcodone-homatropine (HYCODAN) 5-1.5 MG/5ML syrup Take by mouth.    . meclizine (ANTIVERT) 25 MG tablet Take 25 mg by mouth 3 (three) times daily as needed for dizziness (1-2 TABS PO AS NEEDED FOR NAUSEA OR VERTIGO).    . Multiple Vitamin (MULTIVITAMIN WITH MINERALS) TABS tablet Take 1 tablet by mouth daily.    Marland Kitchen oxybutynin (DITROPAN-XL) 10 MG 24 hr tablet Take 10 mg by mouth daily.    . pantoprazole (PROTONIX) 40 MG tablet Take 40 mg by mouth 2 (two) times daily.    . polycarbophil (FIBERCON) 625 MG tablet Take 625 mg by mouth daily.    . vitamin C (ASCORBIC ACID) 500 MG tablet Take 500 mg by mouth daily.    Marland Kitchen albuterol (PROVENTIL) (5 MG/ML) 0.5% nebulizer solution Take 0.5 mLs (2.5 mg total) by nebulization every 4 (four) hours as needed for wheezing or shortness of  breath. (Patient not taking: Reported on 12/05/2018) 20 mL 12  . azithromycin (ZITHROMAX) 500 MG tablet Take 1 tablet (500 mg total) by mouth daily. (Patient not taking: Reported on 12/05/2018) 4 tablet 0  . omeprazole (PRILOSEC) 40 MG capsule Take 40 mg by mouth 2 (two) times daily.     No facility-administered medications prior to visit.     Review of Systems  Constitutional: Negative for chills, fever, malaise/fatigue and weight loss.  HENT: Negative for hearing loss, sore throat and tinnitus.   Eyes: Negative for blurred vision and double vision.  Respiratory: Positive for cough, hemoptysis, sputum production and shortness of breath. Negative for wheezing and stridor.   Cardiovascular: Negative for chest pain, palpitations, orthopnea, leg swelling and PND.  Gastrointestinal: Negative for abdominal pain, constipation, diarrhea, heartburn, nausea and vomiting.  Genitourinary: Negative for dysuria, hematuria and urgency.  Musculoskeletal: Negative for joint pain and myalgias.  Skin: Negative for itching and rash.  Neurological: Negative for dizziness, tingling, weakness and headaches.  Endo/Heme/Allergies: Negative for environmental allergies. Does not bruise/bleed easily.  Psychiatric/Behavioral: Negative for depression. The patient is not nervous/anxious and does not have insomnia.   All other systems reviewed and are negative.    Objective:  Physical Exam Vitals signs reviewed.  Constitutional:      General: He is not in acute distress.    Appearance: He is well-developed.  HENT:     Head: Normocephalic and atraumatic.  Eyes:     General: No scleral icterus.    Conjunctiva/sclera: Conjunctivae normal.     Pupils: Pupils are equal, round, and reactive to light.  Neck:     Musculoskeletal: Neck supple.     Vascular: No JVD.     Trachea: No tracheal deviation.  Cardiovascular:     Rate and Rhythm: Normal rate and regular rhythm.     Heart sounds: Normal heart sounds. No murmur.   Pulmonary:     Effort: Pulmonary effort is normal. No tachypnea, accessory muscle usage or respiratory distress.     Breath sounds: No stridor. Wheezing present. No rhonchi or rales.  Abdominal:     General: Bowel sounds are normal. There is no distension.     Palpations: Abdomen is soft.     Tenderness: There is no abdominal tenderness.  Musculoskeletal:        General: No tenderness.  Lymphadenopathy:     Cervical: No cervical adenopathy.  Skin:  General: Skin is warm and dry.     Capillary Refill: Capillary refill takes less than 2 seconds.     Findings: No rash.  Neurological:     Mental Status: He is alert and oriented to person, place, and time.  Psychiatric:        Behavior: Behavior normal.      Vitals:   12/05/18 1534  BP: 124/70  Pulse: 82  SpO2: 94%  Weight: 180 lb 3.2 oz (81.7 kg)   94% on RA BMI Readings from Last 3 Encounters:  12/05/18 27.40 kg/m  11/17/14 27.06 kg/m  04/11/13 26.90 kg/m   Wt Readings from Last 3 Encounters:  12/05/18 180 lb 3.2 oz (81.7 kg)  11/17/14 178 lb (80.7 kg)  04/11/13 176 lb 14.4 oz (80.2 kg)    CBC    Component Value Date/Time   WBC 6.2 04/13/2013 0544   RBC 3.63 (L) 04/13/2013 0544   HGB 11.1 (L) 04/13/2013 0544   HCT 34.0 (L) 04/13/2013 0544   PLT 445 (H) 04/13/2013 0544   MCV 93.7 04/13/2013 0544   MCH 30.6 04/13/2013 0544   MCHC 32.6 04/13/2013 0544   RDW 13.0 04/13/2013 0544   LYMPHSABS 0.8 04/04/2013 1150   MONOABS 1.0 04/04/2013 1150   EOSABS 0.0 04/04/2013 1150   BASOSABS 0.0 04/04/2013 1150    Chest Imaging: CT chest 11/20/2018: Left hilar mass with associated adenopathy endobronchial narrowing, subcarinal and bilateral hilar adenopathy. Ill-defined small density within the liver possible focus of metastatic disease.  Pulmonary Functions Testing Results: No flowsheet data found.  FeNO: None   Pathology: None   Echocardiogram: None   Heart Catheterization: None     Assessment & Plan:     Lung mass - Plan: Ambulatory referral to Pulmonology, CBC w/Diff, Comp Met (CMET), PTT, INR/PT, PTT, INR/PT, Comp Met (CMET), CBC w/Diff, CANCELED: INR/PT, CANCELED: PTT  Solitary pulmonary nodule - Plan: NM PET Image Initial (PI) Skull Base To Thigh  Former smoker  Hemoptysis  Discussion:  This is a 78 year old gentleman, former smoker recent CAT scan imaging upon admission to the hospital for concern of pneumonia which revealed a left hilar mass with associated mediastinal and hilar adenopathy concerning for a primary bronchogenic carcinoma.  He also has some evidence of upper lobe endobronchial narrowing.  Also there is a small density within the liver which may be consistent with a metastatic focus.  Today we discussed the risk, benefits of alternatives of pursuing endobronchial ultrasound with needle biopsy of the mediastinal adenopathy for tissue sampling and diagnosis of potential underlying bronchogenic carcinoma.  Patient also had hemoptysis recently which would be consistent with the disease seen within the bronchus of the upper lobe.  As a potential source.  We recommend the following: I have discussed with the patient the importance of obtaining tissue sampling.  I think that we should proceed with endobronchial ultrasound-guided transbronchial needle aspiration biopsies of the patient's mediastinal adenopathy to ensure we have adequate tissue sampling to be sent for immunohistochemical staining as I suspect a advanced stage carcinoma.  We will schedule the patient for tomorrow afternoon at the [facility] operating room.  Patient to present to the hospital before noon.  His case is scheduled for 1:30 PM.   I have ordered all preop labs today in the office. We have also ordered a PET scan to be completed for complete staging work-up of his underlying disease.  Case ID: [id number]  Patient has a new diagnosis of chronic hypoxemic respiratory  failure.  We will walk today in the  office to see if he continues O2 needs.  Patient to return to clinic in 3 to 4 weeks pending pathology results from his bronchoscopy. He will also likely need referrals to oncology but pending his biopsy results.  Greater than 50% of this patient 60-minute office visit was spent counseling on the above recommendations and treatment plan is also going over the risk and benefits and alternatives of proceeding with bronchoscopy for diagnosis versus having pet imaging first to help aid with staging.  We opted for tissue sampling and moving forward with the PET scan as scheduled.  These orders have been placed.  All questions were answered.    Current Outpatient Medications:  .  amLODipine (NORVASC) 5 MG tablet, Take 1 tablet (5 mg total) by mouth daily., Disp: 30 tablet, Rfl: 0 .  aspirin 81 MG tablet, Take 81 mg by mouth daily., Disp: , Rfl:  .  benazepril (LOTENSIN) 10 MG tablet, Take 10 mg by mouth daily., Disp: , Rfl:  .  HYDROcodone-homatropine (HYCODAN) 5-1.5 MG/5ML syrup, Take by mouth., Disp: , Rfl:  .  meclizine (ANTIVERT) 25 MG tablet, Take 25 mg by mouth 3 (three) times daily as needed for dizziness (1-2 TABS PO AS NEEDED FOR NAUSEA OR VERTIGO)., Disp: , Rfl:  .  Multiple Vitamin (MULTIVITAMIN WITH MINERALS) TABS tablet, Take 1 tablet by mouth daily., Disp: , Rfl:  .  oxybutynin (DITROPAN-XL) 10 MG 24 hr tablet, Take 10 mg by mouth daily., Disp: , Rfl:  .  pantoprazole (PROTONIX) 40 MG tablet, Take 40 mg by mouth 2 (two) times daily., Disp: , Rfl:  .  polycarbophil (FIBERCON) 625 MG tablet, Take 625 mg by mouth daily., Disp: , Rfl:  .  vitamin C (ASCORBIC ACID) 500 MG tablet, Take 500 mg by mouth daily., Disp: , Rfl:    Garner Nash, DO [facility] 12/05/2018 3:52 PM

## 2018-12-05 NOTE — H&P (View-Only) (Signed)
Synopsis: Referred in January 2020 for abnormal CT, lung mass, mediastinal adenopathy by [name], MD  Subjective:   PATIENT ID: Robert Dixon: male DOB: 01-Oct-1941, MRN: [id number]  Chief Complaint  Patient presents with  . Consult    States it started with a cough that developed into bronchitis. States he was coughing up blood. CT showed lung mass. Currently 3L of oxygen.     PMH of former smoker. He had a cough for several weeks, month treated with abx. He started coughing up blood.  Patient was taken to [facility] where he was treated for pneumonia.  He was given a steroid pack and antibiotics.  He was treated with bronchodilators while he was there.  CT scan imaging revealed a left infrahilar mass with endobronchial disease within the upper lobe as well as enlarged mediastinal adenopathy.  Patient was referred to our pulmonary office for evaluation of potential underlying lung cancer.  Past Medical History:  Diagnosis Date  . GERD (gastroesophageal reflux disease)   . Hypertension   . Migraine with visual aura      Family History  Problem Relation Age of Onset  . Cancer - Lung Mother   . Hypertension Father   . CVA Father   . Cancer - Cervical Sister      Past Surgical History:  Procedure Laterality Date  . TONSILLECTOMY    . VASECTOMY      Social History   Socioeconomic History  . Marital status: Single    Spouse name: Not on file  . Number of children: Not on file  . Years of education: Not on file  . Highest education level: Not on file  Occupational History  . Not on file  Social Needs  . Financial resource strain: Not on file  . Food insecurity:    Worry: Not on file    Inability: Not on file  . Transportation needs:    Medical: Not on file    Non-medical: Not on file  Tobacco Use  . Smoking status: Former Smoker    Types: Cigarettes  . Smokeless tobacco: Never Used  Substance and Sexual Activity  . Alcohol use:  No  . Drug use: No  . Sexual activity: Never  Lifestyle  . Physical activity:    Days per week: Not on file    Minutes per session: Not on file  . Stress: Not on file  Relationships  . Social connections:    Talks on phone: Not on file    Gets together: Not on file    Attends religious service: Not on file    Active member of club or organization: Not on file    Attends meetings of clubs or organizations: Not on file    Relationship status: Not on file  . Intimate partner violence:    Fear of current or ex partner: Not on file    Emotionally abused: Not on file    Physically abused: Not on file    Forced sexual activity: Not on file  Other Topics Concern  . Not on file  Social History Narrative  . Not on file     Allergies  Allergen Reactions  . Codeine Other (See Comments)    HYCODAN.Marland KitchenMAKES PT DIZZY BUT CAN TAKE W/ FOOD     Outpatient Medications Prior to Visit  Medication Sig Dispense Refill  . amLODipine (NORVASC) 5 MG tablet Take 1 tablet (5 mg total) by mouth daily. 30 tablet 0  .  aspirin 81 MG tablet Take 81 mg by mouth daily.    . benazepril (LOTENSIN) 10 MG tablet Take 10 mg by mouth daily.    Marland Kitchen HYDROcodone-homatropine (HYCODAN) 5-1.5 MG/5ML syrup Take by mouth.    . meclizine (ANTIVERT) 25 MG tablet Take 25 mg by mouth 3 (three) times daily as needed for dizziness (1-2 TABS PO AS NEEDED FOR NAUSEA OR VERTIGO).    . Multiple Vitamin (MULTIVITAMIN WITH MINERALS) TABS tablet Take 1 tablet by mouth daily.    Marland Kitchen oxybutynin (DITROPAN-XL) 10 MG 24 hr tablet Take 10 mg by mouth daily.    . pantoprazole (PROTONIX) 40 MG tablet Take 40 mg by mouth 2 (two) times daily.    . polycarbophil (FIBERCON) 625 MG tablet Take 625 mg by mouth daily.    . vitamin C (ASCORBIC ACID) 500 MG tablet Take 500 mg by mouth daily.    Marland Kitchen albuterol (PROVENTIL) (5 MG/ML) 0.5% nebulizer solution Take 0.5 mLs (2.5 mg total) by nebulization every 4 (four) hours as needed for wheezing or shortness of  breath. (Patient not taking: Reported on 12/05/2018) 20 mL 12  . azithromycin (ZITHROMAX) 500 MG tablet Take 1 tablet (500 mg total) by mouth daily. (Patient not taking: Reported on 12/05/2018) 4 tablet 0  . omeprazole (PRILOSEC) 40 MG capsule Take 40 mg by mouth 2 (two) times daily.     No facility-administered medications prior to visit.     Review of Systems  Constitutional: Negative for chills, fever, malaise/fatigue and weight loss.  HENT: Negative for hearing loss, sore throat and tinnitus.   Eyes: Negative for blurred vision and double vision.  Respiratory: Positive for cough, hemoptysis, sputum production and shortness of breath. Negative for wheezing and stridor.   Cardiovascular: Negative for chest pain, palpitations, orthopnea, leg swelling and PND.  Gastrointestinal: Negative for abdominal pain, constipation, diarrhea, heartburn, nausea and vomiting.  Genitourinary: Negative for dysuria, hematuria and urgency.  Musculoskeletal: Negative for joint pain and myalgias.  Skin: Negative for itching and rash.  Neurological: Negative for dizziness, tingling, weakness and headaches.  Endo/Heme/Allergies: Negative for environmental allergies. Does not bruise/bleed easily.  Psychiatric/Behavioral: Negative for depression. The patient is not nervous/anxious and does not have insomnia.   All other systems reviewed and are negative.    Objective:  Physical Exam Vitals signs reviewed.  Constitutional:      General: He is not in acute distress.    Appearance: He is well-developed.  HENT:     Head: Normocephalic and atraumatic.  Eyes:     General: No scleral icterus.    Conjunctiva/sclera: Conjunctivae normal.     Pupils: Pupils are equal, round, and reactive to light.  Neck:     Musculoskeletal: Neck supple.     Vascular: No JVD.     Trachea: No tracheal deviation.  Cardiovascular:     Rate and Rhythm: Normal rate and regular rhythm.     Heart sounds: Normal heart sounds. No murmur.   Pulmonary:     Effort: Pulmonary effort is normal. No tachypnea, accessory muscle usage or respiratory distress.     Breath sounds: No stridor. Wheezing present. No rhonchi or rales.  Abdominal:     General: Bowel sounds are normal. There is no distension.     Palpations: Abdomen is soft.     Tenderness: There is no abdominal tenderness.  Musculoskeletal:        General: No tenderness.  Lymphadenopathy:     Cervical: No cervical adenopathy.  Skin:  General: Skin is warm and dry.     Capillary Refill: Capillary refill takes less than 2 seconds.     Findings: No rash.  Neurological:     Mental Status: He is alert and oriented to person, place, and time.  Psychiatric:        Behavior: Behavior normal.      Vitals:   12/05/18 1534  BP: 124/70  Pulse: 82  SpO2: 94%  Weight: 180 lb 3.2 oz (81.7 kg)   94% on RA BMI Readings from Last 3 Encounters:  12/05/18 27.40 kg/m  11/17/14 27.06 kg/m  04/11/13 26.90 kg/m   Wt Readings from Last 3 Encounters:  12/05/18 180 lb 3.2 oz (81.7 kg)  11/17/14 178 lb (80.7 kg)  04/11/13 176 lb 14.4 oz (80.2 kg)    CBC    Component Value Date/Time   WBC 6.2 04/13/2013 0544   RBC 3.63 (L) 04/13/2013 0544   HGB 11.1 (L) 04/13/2013 0544   HCT 34.0 (L) 04/13/2013 0544   PLT 445 (H) 04/13/2013 0544   MCV 93.7 04/13/2013 0544   MCH 30.6 04/13/2013 0544   MCHC 32.6 04/13/2013 0544   RDW 13.0 04/13/2013 0544   LYMPHSABS 0.8 04/04/2013 1150   MONOABS 1.0 04/04/2013 1150   EOSABS 0.0 04/04/2013 1150   BASOSABS 0.0 04/04/2013 1150    Chest Imaging: CT chest 11/20/2018: Left hilar mass with associated adenopathy endobronchial narrowing, subcarinal and bilateral hilar adenopathy. Ill-defined small density within the liver possible focus of metastatic disease.  Pulmonary Functions Testing Results: No flowsheet data found.  FeNO: None   Pathology: None   Echocardiogram: None   Heart Catheterization: None     Assessment & Plan:     Lung mass - Plan: Ambulatory referral to Pulmonology, CBC w/Diff, Comp Met (CMET), PTT, INR/PT, PTT, INR/PT, Comp Met (CMET), CBC w/Diff, CANCELED: INR/PT, CANCELED: PTT  Solitary pulmonary nodule - Plan: NM PET Image Initial (PI) Skull Base To Thigh  Former smoker  Hemoptysis  Discussion:  This is a 78 year old gentleman, former smoker recent CAT scan imaging upon admission to the hospital for concern of pneumonia which revealed a left hilar mass with associated mediastinal and hilar adenopathy concerning for a primary bronchogenic carcinoma.  He also has some evidence of upper lobe endobronchial narrowing.  Also there is a small density within the liver which may be consistent with a metastatic focus.  Today we discussed the risk, benefits of alternatives of pursuing endobronchial ultrasound with needle biopsy of the mediastinal adenopathy for tissue sampling and diagnosis of potential underlying bronchogenic carcinoma.  Patient also had hemoptysis recently which would be consistent with the disease seen within the bronchus of the upper lobe.  As a potential source.  We recommend the following: I have discussed with the patient the importance of obtaining tissue sampling.  I think that we should proceed with endobronchial ultrasound-guided transbronchial needle aspiration biopsies of the patient's mediastinal adenopathy to ensure we have adequate tissue sampling to be sent for immunohistochemical staining as I suspect a advanced stage carcinoma.  We will schedule the patient for tomorrow afternoon at the [facility] operating room.  Patient to present to the hospital before noon.  His case is scheduled for 1:30 PM.   I have ordered all preop labs today in the office. We have also ordered a PET scan to be completed for complete staging work-up of his underlying disease.  Case ID: [id number]  Patient has a new diagnosis of chronic hypoxemic respiratory  failure.  We will walk today in the  office to see if he continues O2 needs.  Patient to return to clinic in 3 to 4 weeks pending pathology results from his bronchoscopy. He will also likely need referrals to oncology but pending his biopsy results.  Greater than 50% of this patient 60-minute office visit was spent counseling on the above recommendations and treatment plan is also going over the risk and benefits and alternatives of proceeding with bronchoscopy for diagnosis versus having pet imaging first to help aid with staging.  We opted for tissue sampling and moving forward with the PET scan as scheduled.  These orders have been placed.  All questions were answered.    Current Outpatient Medications:  .  amLODipine (NORVASC) 5 MG tablet, Take 1 tablet (5 mg total) by mouth daily., Disp: 30 tablet, Rfl: 0 .  aspirin 81 MG tablet, Take 81 mg by mouth daily., Disp: , Rfl:  .  benazepril (LOTENSIN) 10 MG tablet, Take 10 mg by mouth daily., Disp: , Rfl:  .  HYDROcodone-homatropine (HYCODAN) 5-1.5 MG/5ML syrup, Take by mouth., Disp: , Rfl:  .  meclizine (ANTIVERT) 25 MG tablet, Take 25 mg by mouth 3 (three) times daily as needed for dizziness (1-2 TABS PO AS NEEDED FOR NAUSEA OR VERTIGO)., Disp: , Rfl:  .  Multiple Vitamin (MULTIVITAMIN WITH MINERALS) TABS tablet, Take 1 tablet by mouth daily., Disp: , Rfl:  .  oxybutynin (DITROPAN-XL) 10 MG 24 hr tablet, Take 10 mg by mouth daily., Disp: , Rfl:  .  pantoprazole (PROTONIX) 40 MG tablet, Take 40 mg by mouth 2 (two) times daily., Disp: , Rfl:  .  polycarbophil (FIBERCON) 625 MG tablet, Take 625 mg by mouth daily., Disp: , Rfl:  .  vitamin C (ASCORBIC ACID) 500 MG tablet, Take 500 mg by mouth daily., Disp: , Rfl:    Garner Nash, DO [facility] 12/05/2018 3:52 PM

## 2018-12-05 NOTE — Progress Notes (Signed)
Robert Dixon asked that I speak to his daughter- in- law, Robert Dixon. PCP is Dr Valli Glance. Patient is on home oxygen, since discharge from [facility].  Patient was admitted 11/20/2018 with acute hypoxia, hymoptysis.  A prescription for Albuteroli nhaler was sent to be filled after appointment with Dr Valeta Harms, it has not been  picked up at this time.  Patient still has some coughing , but is not using Hycodan cough syrup. Robert. Carreker has a history of HTN, denies chest pain.Patient does not see a cardiologist

## 2018-12-06 ENCOUNTER — Ambulatory Visit (HOSPITAL_COMMUNITY): Payer: Medicare HMO | Admitting: Anesthesiology

## 2018-12-06 ENCOUNTER — Other Ambulatory Visit: Payer: Self-pay

## 2018-12-06 ENCOUNTER — Telehealth: Payer: Self-pay | Admitting: Pulmonary Disease

## 2018-12-06 ENCOUNTER — Encounter (HOSPITAL_COMMUNITY)
Admission: RE | Disposition: A | Discharge status: Home / Self Care | Payer: Self-pay | Source: Home / Self Care | Attending: Pulmonary Disease

## 2018-12-06 ENCOUNTER — Ambulatory Visit (HOSPITAL_COMMUNITY)
Admission: RE | Admit: 2018-12-06 | Discharge: 2018-12-06 | Disposition: A | Discharge status: Home / Self Care | Payer: Medicare HMO | Attending: Pulmonary Disease | Admitting: Pulmonary Disease

## 2018-12-06 ENCOUNTER — Encounter (HOSPITAL_COMMUNITY): Payer: Self-pay

## 2018-12-06 DIAGNOSIS — R848 Other abnormal findings in specimens from respiratory organs and thorax: Secondary | ICD-10-CM | POA: Diagnosis not present

## 2018-12-06 DIAGNOSIS — Z79899 Other long term (current) drug therapy: Secondary | ICD-10-CM | POA: Insufficient documentation

## 2018-12-06 DIAGNOSIS — Z87891 Personal history of nicotine dependence: Secondary | ICD-10-CM | POA: Diagnosis not present

## 2018-12-06 DIAGNOSIS — I10 Essential (primary) hypertension: Secondary | ICD-10-CM | POA: Insufficient documentation

## 2018-12-06 DIAGNOSIS — J449 Chronic obstructive pulmonary disease, unspecified: Secondary | ICD-10-CM | POA: Insufficient documentation

## 2018-12-06 DIAGNOSIS — C771 Secondary and unspecified malignant neoplasm of intrathoracic lymph nodes: Secondary | ICD-10-CM | POA: Diagnosis not present

## 2018-12-06 DIAGNOSIS — Z9981 Dependence on supplemental oxygen: Secondary | ICD-10-CM | POA: Diagnosis not present

## 2018-12-06 DIAGNOSIS — K219 Gastro-esophageal reflux disease without esophagitis: Secondary | ICD-10-CM | POA: Diagnosis not present

## 2018-12-06 DIAGNOSIS — R918 Other nonspecific abnormal finding of lung field: Secondary | ICD-10-CM | POA: Diagnosis not present

## 2018-12-06 DIAGNOSIS — R590 Localized enlarged lymph nodes: Secondary | ICD-10-CM | POA: Diagnosis not present

## 2018-12-06 DIAGNOSIS — C3412 Malignant neoplasm of upper lobe, left bronchus or lung: Secondary | ICD-10-CM | POA: Diagnosis not present

## 2018-12-06 DIAGNOSIS — J9809 Other diseases of bronchus, not elsewhere classified: Secondary | ICD-10-CM | POA: Diagnosis not present

## 2018-12-06 DIAGNOSIS — C3490 Malignant neoplasm of unspecified part of unspecified bronchus or lung: Secondary | ICD-10-CM

## 2018-12-06 HISTORY — DX: Other complications of anesthesia, initial encounter: T88.59XA

## 2018-12-06 HISTORY — DX: Localized enlarged lymph nodes: R59.0

## 2018-12-06 HISTORY — DX: Solitary pulmonary nodule: R91.1

## 2018-12-06 HISTORY — DX: Chronic obstructive pulmonary disease, unspecified: J44.9

## 2018-12-06 HISTORY — DX: Personal history of other diseases of the digestive system: Z87.19

## 2018-12-06 HISTORY — DX: Dyspnea, unspecified: R06.00

## 2018-12-06 HISTORY — DX: Pneumonia, unspecified organism: J18.9

## 2018-12-06 HISTORY — DX: Malignant (primary) neoplasm, unspecified: C80.1

## 2018-12-06 HISTORY — DX: Legionnaires' disease: A48.1

## 2018-12-06 HISTORY — DX: Other specified postprocedural states: Z98.890

## 2018-12-06 HISTORY — PX: VIDEO BRONCHOSCOPY WITH ENDOBRONCHIAL ULTRASOUND: SHX6177

## 2018-12-06 HISTORY — DX: Hypoxemia: R09.02

## 2018-12-06 HISTORY — DX: Hemoptysis: R04.2

## 2018-12-06 HISTORY — DX: Adverse effect of unspecified anesthetic, initial encounter: T41.45XA

## 2018-12-06 HISTORY — DX: Nausea with vomiting, unspecified: R11.2

## 2018-12-06 LAB — CBC WITH DIFFERENTIAL/PLATELET
Basophils Absolute: 0.1 10*3/uL (ref 0.0–0.1)
Basophils Relative: 0.7 % (ref 0.0–3.0)
Eosinophils Absolute: 0.1 10*3/uL (ref 0.0–0.7)
Eosinophils Relative: 0.6 % (ref 0.0–5.0)
HCT: 37 % — ABNORMAL LOW (ref 39.0–52.0)
Hemoglobin: 12.2 g/dL — ABNORMAL LOW (ref 13.0–17.0)
Lymphocytes Relative: 13.7 % (ref 12.0–46.0)
Lymphs Abs: 1.9 10*3/uL (ref 0.7–4.0)
MCHC: 32.9 g/dL (ref 30.0–36.0)
MCV: 107.8 fl — ABNORMAL HIGH (ref 78.0–100.0)
Monocytes Absolute: 0.7 10*3/uL (ref 0.1–1.0)
Monocytes Relative: 5 % (ref 3.0–12.0)
Neutro Abs: 11 10*3/uL — ABNORMAL HIGH (ref 1.4–7.7)
Neutrophils Relative %: 80 % — ABNORMAL HIGH (ref 43.0–77.0)
Platelets: 244 10*3/uL (ref 150.0–400.0)
RBC: 3.43 Mil/uL — ABNORMAL LOW (ref 4.22–5.81)
RDW: 15.8 % — ABNORMAL HIGH (ref 11.5–15.5)
WBC: 13.8 10*3/uL — ABNORMAL HIGH (ref 4.0–10.5)

## 2018-12-06 LAB — COMPREHENSIVE METABOLIC PANEL
ALT: 25 U/L (ref 0–53)
ALT: 27 U/L (ref 0–44)
AST: 16 U/L (ref 0–37)
AST: 20 U/L (ref 15–41)
Albumin: 3.8 g/dL (ref 3.5–5.2)
Albumin: 3.3 g/dL — ABNORMAL LOW (ref 3.5–5.0)
Alkaline Phosphatase: 82 U/L (ref 39–117)
Alkaline Phosphatase: 75 U/L (ref 38–126)
Anion gap: 6 (ref 5–15)
BUN: 25 mg/dL — ABNORMAL HIGH (ref 6–23)
BUN: 23 mg/dL (ref 8–23)
CO2: 24 mEq/L (ref 19–32)
CO2: 23 mmol/L (ref 22–32)
Calcium: 8.8 mg/dL (ref 8.4–10.5)
Calcium: 8.5 mg/dL — ABNORMAL LOW (ref 8.9–10.3)
Chloride: 103 mEq/L (ref 96–112)
Chloride: 108 mmol/L (ref 98–111)
Creatinine, Ser: 0.98 mg/dL (ref 0.40–1.50)
Creatinine, Ser: 0.98 mg/dL (ref 0.61–1.24)
GFR calc Af Amer: >60 mL/min (ref >60)
GFR calc non Af Amer: >60 mL/min (ref >60)
GFR: 78.73 mL/min (ref >60.00)
Glucose, Bld: 95 mg/dL (ref 70–99)
Glucose, Bld: 100 mg/dL — ABNORMAL HIGH (ref 70–99)
Potassium: 5.1 mEq/L (ref 3.5–5.1)
Potassium: 4.5 mmol/L (ref 3.5–5.1)
Sodium: 136 mEq/L (ref 135–145)
Sodium: 137 mmol/L (ref 135–145)
Total Bilirubin: 0.4 mg/dL (ref 0.2–1.2)
Total Bilirubin: 1 mg/dL (ref 0.3–1.2)
Total Protein: 6.3 g/dL (ref 6.0–8.3)
Total Protein: 5.9 g/dL — ABNORMAL LOW (ref 6.5–8.1)

## 2018-12-06 LAB — CBC
HCT: 33.6 % — ABNORMAL LOW (ref 39.0–52.0)
Hemoglobin: 10.8 g/dL — ABNORMAL LOW (ref 13.0–17.0)
MCH: 35.2 pg — ABNORMAL HIGH (ref 26.0–34.0)
MCHC: 32.1 g/dL (ref 30.0–36.0)
MCV: 109.4 fL — ABNORMAL HIGH (ref 80.0–100.0)
Platelets: 192 10*3/uL (ref 150–400)
RBC: 3.07 MIL/uL — ABNORMAL LOW (ref 4.22–5.81)
RDW: 15.2 % (ref 11.5–15.5)
WBC: 12.8 10*3/uL — ABNORMAL HIGH (ref 4.0–10.5)
nRBC: 0 % (ref 0.0–0.2)

## 2018-12-06 LAB — PROTIME-INR
INR: 0.9
Prothrombin Time: 9.8 s (ref 9.0–11.5)

## 2018-12-06 LAB — APTT: aPTT: 31 s (ref 22–34)

## 2018-12-06 SURGERY — BRONCHOSCOPY, WITH EBUS
Anesthesia: General

## 2018-12-06 MED ORDER — LIDOCAINE 2% (20 MG/ML) 5 ML SYRINGE
INTRAMUSCULAR | Status: DC | PRN
  Administered 2018-12-06: 60 mg via INTRAVENOUS

## 2018-12-06 MED ORDER — ONDANSETRON HCL 4 MG/2ML IJ SOLN
4.0000 mg | Freq: Once | INTRAMUSCULAR | Status: AC | PRN
  Administered 2018-12-06: 4 mg via INTRAVENOUS

## 2018-12-06 MED ORDER — SUGAMMADEX SODIUM 200 MG/2ML IV SOLN
INTRAVENOUS | Status: DC | PRN
  Administered 2018-12-06: 158.8 mg via INTRAVENOUS

## 2018-12-06 MED ORDER — ROCURONIUM BROMIDE 50 MG/5ML IV SOSY
PREFILLED_SYRINGE | INTRAVENOUS | Status: DC | PRN
  Administered 2018-12-06: 50 mg via INTRAVENOUS

## 2018-12-06 MED ORDER — PROPOFOL 10 MG/ML IV BOLUS
INTRAVENOUS | Status: AC
  Filled 2018-12-06: qty 20

## 2018-12-06 MED ORDER — DEXAMETHASONE SODIUM PHOSPHATE 10 MG/ML IJ SOLN
INTRAMUSCULAR | Status: DC | PRN
  Administered 2018-12-06: 10 mg via INTRAVENOUS

## 2018-12-06 MED ORDER — 0.9 % SODIUM CHLORIDE (POUR BTL) OPTIME
TOPICAL | Status: DC | PRN
  Administered 2018-12-06: 1000 mL

## 2018-12-06 MED ORDER — ONDANSETRON HCL 4 MG/2ML IJ SOLN
INTRAMUSCULAR | Status: AC
  Filled 2018-12-06: qty 2

## 2018-12-06 MED ORDER — FENTANYL CITRATE (PF) 100 MCG/2ML IJ SOLN: 25.0000 ug | INTRAMUSCULAR | Status: DC | PRN

## 2018-12-06 MED ORDER — LACTATED RINGERS IV SOLN
INTRAVENOUS | Status: DC
  Administered 2018-12-06: 12:00:00 via INTRAVENOUS

## 2018-12-06 MED ORDER — DEXAMETHASONE SODIUM PHOSPHATE 10 MG/ML IJ SOLN
INTRAMUSCULAR | Status: AC
  Filled 2018-12-06: qty 1

## 2018-12-06 MED ORDER — PROPOFOL 10 MG/ML IV BOLUS
INTRAVENOUS | Status: DC | PRN
  Administered 2018-12-06: 120 mg via INTRAVENOUS
  Administered 2018-12-06: 30 mg via INTRAVENOUS

## 2018-12-06 MED ORDER — ROCURONIUM BROMIDE 50 MG/5ML IV SOSY
PREFILLED_SYRINGE | INTRAVENOUS | Status: AC
  Filled 2018-12-06: qty 5

## 2018-12-06 MED ORDER — ONDANSETRON HCL 4 MG/2ML IJ SOLN
INTRAMUSCULAR | Status: DC | PRN
  Administered 2018-12-06: 4 mg via INTRAVENOUS

## 2018-12-06 MED ORDER — FENTANYL CITRATE (PF) 250 MCG/5ML IJ SOLN
INTRAMUSCULAR | Status: DC | PRN
  Administered 2018-12-06: 100 ug via INTRAVENOUS
  Administered 2018-12-06: 50 ug via INTRAVENOUS

## 2018-12-06 MED ORDER — OXYCODONE HCL 5 MG PO TABS: 5.0000 mg | ORAL_TABLET | Freq: Once | ORAL | Status: DC | PRN

## 2018-12-06 MED ORDER — FENTANYL CITRATE (PF) 250 MCG/5ML IJ SOLN
INTRAMUSCULAR | Status: AC
  Filled 2018-12-06: qty 5

## 2018-12-06 MED ORDER — OXYCODONE HCL 5 MG/5ML PO SOLN: 5.0000 mg | Freq: Once | ORAL | Status: DC | PRN

## 2018-12-06 SURGICAL SUPPLY — 34 items
ADAPTER VALVE BIOPSY EBUS (MISCELLANEOUS) IMPLANT
ADPTR VALVE BIOPSY EBUS (MISCELLANEOUS)
BRUSH CYTOL CELLEBRITY 1.5X140 (MISCELLANEOUS) IMPLANT
CANISTER SUCT 3000ML PPV (MISCELLANEOUS) ×2 IMPLANT
CONT SPEC 4OZ CLIKSEAL STRL BL (MISCELLANEOUS) ×2 IMPLANT
COVER BACK TABLE 60X90IN (DRAPES) ×2 IMPLANT
FILTER STRAW FLUID ASPIR (MISCELLANEOUS) IMPLANT
FORCEPS BIOP RJ4 1.8 (CUTTING FORCEPS) IMPLANT
GAUZE SPONGE 4X4 12PLY STRL (GAUZE/BANDAGES/DRESSINGS) ×2 IMPLANT
GLOVE SURG SS PI 7.5 STRL IVOR (GLOVE) ×2 IMPLANT
GOWN STRL REUS W/ TWL LRG LVL3 (GOWN DISPOSABLE) ×2 IMPLANT
GOWN STRL REUS W/TWL LRG LVL3 (GOWN DISPOSABLE) ×2
KIT CLEAN ENDO COMPLIANCE (KITS) ×4 IMPLANT
KIT TURNOVER KIT B (KITS) ×2 IMPLANT
MARKER SKIN DUAL TIP RULER LAB (MISCELLANEOUS) ×2 IMPLANT
NDL ASPIRATION VIZISHOT 19G (NEEDLE) IMPLANT
NDL ASPIRATION VIZISHOT 21G (NEEDLE) IMPLANT
NEEDLE ASPIRATION VIZISHOT 19G (NEEDLE) ×2 IMPLANT
NEEDLE ASPIRATION VIZISHOT 21G (NEEDLE) IMPLANT
NS IRRIG 1000ML POUR BTL (IV SOLUTION) ×2 IMPLANT
OIL SILICONE PENTAX (PARTS (SERVICE/REPAIRS)) ×2 IMPLANT
PAD ARMBOARD 7.5X6 YLW CONV (MISCELLANEOUS) ×4 IMPLANT
STOPCOCK 4 WAY LG BORE MALE ST (IV SETS) ×2 IMPLANT
SYR 20CC LL (SYRINGE) ×2 IMPLANT
SYR 20ML ECCENTRIC (SYRINGE) ×6 IMPLANT
SYR 3ML LL SCALE MARK (SYRINGE) IMPLANT
SYR 50ML SLIP (SYRINGE) ×2 IMPLANT
SYR 5ML LL (SYRINGE) ×2 IMPLANT
TRAP SPECIMEN MUCOUS 40CC (MISCELLANEOUS) IMPLANT
TUBE CONNECTING 20X1/4 (TUBING) ×2 IMPLANT
VALVE BIOPSY  SINGLE USE (MISCELLANEOUS) ×1
VALVE BIOPSY SINGLE USE (MISCELLANEOUS) ×1 IMPLANT
VALVE SUCTION BRONCHIO DISP (MISCELLANEOUS) ×2 IMPLANT
WATER STERILE IRR 1000ML POUR (IV SOLUTION) ×2 IMPLANT

## 2018-12-06 NOTE — Op Note (Signed)
Video Bronchoscopy with Endobronchial Ultrasound Procedure Note  Date of Operation: 12/06/2018  Pre-op Diagnosis: Left hilar mass, mediastinal adenopathy  Post-op Diagnosis: Left hilar mass, endobronchial lesion of the left upper lobe and left lower lobe, mediastinal adenopathy  Surgeon: Garner Nash, DO  Assistants: None   Anesthesia: General endotracheal anesthesia  Operation: Flexible video fiberoptic bronchoscopy with endobronchial ultrasound and biopsies.  Estimated Blood Loss: Minimal, <8KD  Complications: None  Indications and History: Robert Dixon is a 78 y.o. male with CT imaging concerning for left hilar mass with associated mediastinal adenopathy.  The risks, benefits, complications, treatment options and expected outcomes were discussed with the patient.  The possibilities of pneumothorax, pneumonia, reaction to medication, pulmonary aspiration, perforation of a viscus, bleeding, failure to diagnose a condition and creating a complication requiring transfusion or operation were discussed with the patient who freely signed the consent.    Description of Procedure: The patient was examined in the preoperative area and history and data from the preprocedure consultation were reviewed. It was deemed appropriate to proceed.  The patient was taken to [facility] OR 12, identified as Robert Dixon and the procedure verified as Flexible Video Fiberoptic Bronchoscopy.  A Time Out was held and the above information confirmed. After being taken to the operating room general anesthesia was initiated and the patient  was orally intubated. The video fiberoptic bronchoscope was introduced via the endotracheal tube and a general inspection was performed which showed near total occlusion of the left upper lobe with a heterogenous fungating mass growing from the medial wall occluding the opening of the left upper lobe.  The lingula was not visualized.  There was significant splaying of the left main  subcarina and tumor infiltration through the mucosa of the left main subcarina with greater than 90% occlusion via compression of the lower lobe opening. The standard scope was then withdrawn and the endobronchial ultrasound was used to identify and characterize the peritracheal, hilar and bronchial lymph nodes. Inspection showed significantly enlarged station 7 lymph node. Using real-time ultrasound guidance transbronchial needle aspiration biopsies were take from Station 7 nodes and were sent for cytology.  Following sampling of station 7 via transbronchial needle aspirations we completed brushings of the left upper lobe lesion.  However after one single pass of brushings to the left upper lobe lesion there was bleeding from this.  The bleeding coming from the left upper lobe lesion stopped spontaneously.  We also completed a BAL and bronchial washings of the left upper lobe to be sent for cytology.  This was completed with 60 cc of saline and moderate return.  The bronchoscope was returned to just above the main carina and there was no evidence of active bleeding.  The patient tolerated the procedure well without apparent complications. There was no significant blood loss. The bronchoscope was withdrawn. Anesthesia was reversed and the patient was taken to the PACU for recovery.   Samples: 1.  Transbronchial needle aspiration biopsies x7 to station 7, 3 slides, for cell block 2.  Brushing of the left upper endobronchial lobe lesion 3.  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 We will review the cytology, pathology and microbiology results with the patient when they become available. Outpatient followup will be with Garner Nash, DO.   Garner Nash, DO  Pulmonary  Critical Care 12/06/2018 2:10 PM   Personal pager: [phone] If unanswered, please page CCM On-call: [phone]

## 2018-12-06 NOTE — Progress Notes (Signed)
Called [facility] regarding labs that were drawn yesterday but has not yet resulted. Pt needs this lab work before surgery. Awaiting a call back at this time.   Jacqlyn Larsen, RN

## 2018-12-06 NOTE — Interval H&P Note (Signed)
History and Physical Interval Note:  12/06/2018 1:08 PM  Robert Dixon  has presented today for surgery, with the diagnosis of Left hilar mass with associated adenopathy endobronchial narrowing  The various methods of treatment have been discussed with the patient and family. After consideration of risks, benefits and other options for treatment, the patient has consented to  Procedure(s): Senath (N/A) as a surgical intervention .  The patient's history has been reviewed, patient examined, no change in status, stable for surgery.  I have reviewed the patient's chart and labs.  Questions were answered to the patient's satisfaction.    Patient seen and examined in pre-op. No barriers to proceed. Patients questions answered. Family at bedside. All risks, benefits and alternatives discussed. Patient agreeable to proceed.   Garner Nash, DO [facility] 12/06/2018 1:09 PM

## 2018-12-06 NOTE — Anesthesia Postprocedure Evaluation (Signed)
Anesthesia Post Note  Patient: Robert Dixon  Procedure(s) Performed: VIDEO BRONCHOSCOPY WITH ENDOBRONCHIAL ULTRASOUND (N/A )     Patient location during evaluation: PACU Anesthesia Type: General Level of consciousness: awake and alert Pain management: pain level controlled Vital Signs Assessment: post-procedure vital signs reviewed and stable Respiratory status: spontaneous breathing, nonlabored ventilation, respiratory function stable and patient connected to nasal cannula oxygen Cardiovascular status: blood pressure returned to baseline and stable Postop Assessment: no apparent nausea or vomiting Anesthetic complications: no    Last Vitals:  Vitals:   12/06/18 1445 12/06/18 1500  BP: 115/65 (!) 121/57  Pulse: 93 92  Resp: 15 18  Temp:    SpO2: 93% 95%    Last Pain:  Vitals:   12/06/18 1445  TempSrc:   PainSc: 0-No pain                 Audry Pili

## 2018-12-06 NOTE — Anesthesia Procedure Notes (Signed)
Procedure Name: Intubation Date/Time: 12/06/2018 1:27 PM Performed by: Renato Shin, CRNA Pre-anesthesia Checklist: Patient identified, Emergency Drugs available, Suction available and Patient being monitored Patient Re-evaluated:Patient Re-evaluated prior to induction Oxygen Delivery Method: Circle system utilized Preoxygenation: Pre-oxygenation with 100% oxygen Induction Type: IV induction Ventilation: Mask ventilation without difficulty and Oral airway inserted - appropriate to patient size Laryngoscope Size: Miller and 3 Grade View: Grade I Tube type: Oral Tube size: 8.0 mm Number of attempts: 1 Airway Equipment and Method: Stylet Placement Confirmation: ETT inserted through vocal cords under direct vision,  positive ETCO2 and CO2 detector Secured at: 21 cm Tube secured with: Tape Dental Injury: Teeth and Oropharynx as per pre-operative assessment

## 2018-12-06 NOTE — Anesthesia Preprocedure Evaluation (Addendum)
Anesthesia Evaluation  Patient identified by MRN, date of birth, ID band Patient awake    Reviewed: Allergy & Precautions, NPO status , Patient's Chart, lab work & pertinent test results  History of Anesthesia Complications (+) PONV and history of anesthetic complications  Airway Mallampati: II  TM Distance: >3 FB Neck ROM: Full    Dental  (+) Dental Advisory Given   Pulmonary COPD,  COPD inhaler and oxygen dependent, former smoker,   Lung mass     + wheezing      Cardiovascular hypertension, Pt. on medications  Rhythm:Regular Rate:Normal     Neuro/Psych  Headaches, negative psych ROS   GI/Hepatic Neg liver ROS, hiatal hernia, GERD  Medicated and Controlled,  Endo/Other  negative endocrine ROS  Renal/GU negative Renal ROS     Musculoskeletal negative musculoskeletal ROS (+)   Abdominal   Peds  Hematology negative hematology ROS (+)   Anesthesia Other Findings   Reproductive/Obstetrics                            Anesthesia Physical Anesthesia Plan  ASA: III  Anesthesia Plan: General   Post-op Pain Management:    Induction: Intravenous  PONV Risk Score and Plan: 3 and Treatment may vary due to age or medical condition, Ondansetron, Propofol infusion and Dexamethasone  Airway Management Planned: Oral ETT  Additional Equipment: None  Intra-op Plan:   Post-operative Plan: Possible Post-op intubation/ventilation  Informed Consent: I have reviewed the patients History and Physical, chart, labs and discussed the procedure including the risks, benefits and alternatives for the proposed anesthesia with the patient or authorized representative who has indicated his/her understanding and acceptance.   Dental advisory given  Plan Discussed with: CRNA and Anesthesiologist  Anesthesia Plan Comments:        Anesthesia Quick Evaluation

## 2018-12-06 NOTE — Discharge Instructions (Signed)
Flexible Bronchoscopy, Care After This sheet gives you information about how to care for yourself after your procedure. Your health care provider may also give you more specific instructions. If you have problems or questions, contact your health care provider. What can I expect after the procedure? After the procedure, it is common to have the following symptoms for 24-48 hours:  A cough that is worse than it was before the procedure.  A low-grade fever.  A sore throat or hoarse voice.  Small streaks of blood in the mucus from your lungs (sputum), if tissue samples were removed (biopsy). Follow these instructions at home: Eating and drinking  The day after the procedure, return to your normal diet. Driving  Do not drive for 24 hours if you were given a medicine to help you relax (sedative).  Do not drive or use heavy machinery while taking prescription pain medicine. General instructions   Take over-the-counter and prescription medicines only as told by your health care provider.  Return to your normal activities as told by your health care provider. Ask your health care provider what activities are safe for you.  Do not use any products that contain nicotine or tobacco, such as cigarettes and e-cigarettes. If you need help quitting, ask your health care provider.  Keep all follow-up visits as told by your health care provider. This is important, especially if you had a biopsy taken. Get help right away if:  You have shortness of breath that gets worse.  You become light-headed or feel like you might faint.  You have chest pain.  You cough up more than a small amount of blood.  The amount of blood you cough up increases. Summary  Common symptoms in the 24-48 hours following a flexible bronchoscopy include cough, low-grade fever, sore throat or hoarse voice, and blood-streaked mucus from the lungs (if you had a biopsy).  Do not eat or drink anything (including water) for  2 hours after your procedure, or until your local anesthetic has worn off. You can return to your normal diet the day after the procedure.  Get help right away if you develop worsening shortness of breath, have chest pain, become light-headed, or cough up more than a small amount of blood. This information is not intended to replace advice given to you by your health care provider. Make sure you discuss any questions you have with your health care provider. Document Released: 06/02/2005 Document Revised: 12/01/2016 Document Reviewed: 12/01/2016 Elsevier Interactive Patient Education  2019 [employer].

## 2018-12-06 NOTE — Telephone Encounter (Signed)
Spoke with Lilia Pro. She was requesting the results of patient's blood work from yesterday. Advised her that the only results we have available are the INR/PT PTT. She was requesting the CBC since patient is having a procedure done today. Since we did not have the results, patient will have to have his labs drawn again.   Nothing further needed at time of call.

## 2018-12-06 NOTE — Transfer of Care (Signed)
Immediate Anesthesia Transfer of Care Note  Patient: Robert Dixon  Procedure(s) Performed: VIDEO BRONCHOSCOPY WITH ENDOBRONCHIAL ULTRASOUND (N/A )  Patient Location: PACU  Anesthesia Type:General  Level of Consciousness: awake, oriented and patient cooperative  Airway & Oxygen Therapy: Patient Spontanous Breathing and Patient connected to face mask oxygen  Post-op Assessment: Report given to RN and Post -op Vital signs reviewed and stable  Post vital signs: Reviewed and stable  Last Vitals:  Vitals Value Taken Time  BP    Temp    Pulse    Resp    SpO2      Last Pain:  Vitals:   12/06/18 1142  TempSrc:   PainSc: 8       Patients Stated Pain Goal: 3 (38/17/71 1657)  Complications: No apparent anesthesia complications

## 2018-12-07 ENCOUNTER — Encounter (HOSPITAL_COMMUNITY): Payer: Self-pay | Admitting: Pulmonary Disease

## 2018-12-09 ENCOUNTER — Non-Acute Institutional Stay: Payer: Self-pay | Admitting: *Deleted

## 2018-12-09 ENCOUNTER — Telehealth: Payer: Self-pay | Admitting: *Deleted

## 2018-12-09 ENCOUNTER — Encounter: Payer: Self-pay | Admitting: *Deleted

## 2018-12-09 DIAGNOSIS — Z792 Long term (current) use of antibiotics: Secondary | ICD-10-CM | POA: Diagnosis not present

## 2018-12-09 DIAGNOSIS — Z79891 Long term (current) use of opiate analgesic: Secondary | ICD-10-CM | POA: Diagnosis not present

## 2018-12-09 DIAGNOSIS — Z7982 Long term (current) use of aspirin: Secondary | ICD-10-CM | POA: Diagnosis not present

## 2018-12-09 DIAGNOSIS — Z87891 Personal history of nicotine dependence: Secondary | ICD-10-CM | POA: Diagnosis not present

## 2018-12-09 DIAGNOSIS — K219 Gastro-esophageal reflux disease without esophagitis: Secondary | ICD-10-CM | POA: Diagnosis not present

## 2018-12-09 DIAGNOSIS — J189 Pneumonia, unspecified organism: Secondary | ICD-10-CM | POA: Diagnosis not present

## 2018-12-09 DIAGNOSIS — J440 Chronic obstructive pulmonary disease with acute lower respiratory infection: Secondary | ICD-10-CM | POA: Diagnosis not present

## 2018-12-09 DIAGNOSIS — R590 Localized enlarged lymph nodes: Secondary | ICD-10-CM

## 2018-12-09 DIAGNOSIS — I10 Essential (primary) hypertension: Secondary | ICD-10-CM | POA: Diagnosis not present

## 2018-12-09 DIAGNOSIS — R911 Solitary pulmonary nodule: Secondary | ICD-10-CM | POA: Diagnosis not present

## 2018-12-09 NOTE — Telephone Encounter (Signed)
Oncology Nurse Navigator Documentation  Oncology Nurse Navigator Flowsheets 12/09/2018  Navigator Location [facility]  Navigator Encounter Type Telephone/I received a call from patient's daughter in law.  She is the main point of contact for patient's appts.  I updated her on appt to see Dr. Julien Nordmann and Dr. Lisbeth Renshaw.  She verbalized understanding of appt time and place.   Telephone Outgoing Call  Treatment Phase Pre-Tx/Tx Discussion  Barriers/Navigation Needs Coordination of Care  Interventions Coordination of Care  Coordination of Care Appts  Acuity Level 1  Time Spent with Patient 15

## 2018-12-09 NOTE — Telephone Encounter (Signed)
Oncology Nurse Navigator Documentation  Oncology Nurse Navigator Flowsheets 12/09/2018  Navigator Location [facility]  Referral date to RadOnc/MedOnc 12/09/2018  Navigator Encounter Type Other/per Dr. Julien Nordmann, referral to rad onc completed due to hemoptysis.  I called to update family.  I spoke with his son but he wants me to call his wife.  I called and left a vm message.    Abnormal Finding Date 11/20/2018  Confirmed Diagnosis Date 12/06/2018  Treatment Phase Pre-Tx/Tx Discussion  Barriers/Navigation Needs Coordination of Care  Interventions Coordination of Care  Coordination of Care Other  Acuity Level 2  Time Spent with Patient 30

## 2018-12-10 ENCOUNTER — Ambulatory Visit: Payer: Medicare HMO

## 2018-12-10 ENCOUNTER — Ambulatory Visit: Admission: RE | Admit: 2018-12-10 | Payer: Medicare HMO | Source: Ambulatory Visit | Admitting: Radiation Oncology

## 2018-12-11 ENCOUNTER — Telehealth: Payer: Self-pay | Admitting: Pulmonary Disease

## 2018-12-11 ENCOUNTER — Ambulatory Visit: Payer: Medicare HMO | Admitting: Radiation Oncology

## 2018-12-11 ENCOUNTER — Ambulatory Visit: Payer: Medicare HMO

## 2018-12-11 ENCOUNTER — Ambulatory Visit
Admission: RE | Admit: 2018-12-11 | Discharge: 2018-12-11 | Disposition: A | Discharge status: Home / Self Care | Payer: Medicare HMO | Source: Ambulatory Visit | Attending: Pulmonary Disease | Admitting: Pulmonary Disease

## 2018-12-11 DIAGNOSIS — I2510 Atherosclerotic heart disease of native coronary artery without angina pectoris: Secondary | ICD-10-CM | POA: Insufficient documentation

## 2018-12-11 DIAGNOSIS — R918 Other nonspecific abnormal finding of lung field: Secondary | ICD-10-CM | POA: Diagnosis not present

## 2018-12-11 DIAGNOSIS — I700 Atherosclerosis of aorta: Secondary | ICD-10-CM | POA: Diagnosis not present

## 2018-12-11 DIAGNOSIS — R911 Solitary pulmonary nodule: Secondary | ICD-10-CM

## 2018-12-11 DIAGNOSIS — R590 Localized enlarged lymph nodes: Secondary | ICD-10-CM | POA: Insufficient documentation

## 2018-12-11 DIAGNOSIS — C8528 Mediastinal (thymic) large B-cell lymphoma, lymph nodes of multiple sites: Secondary | ICD-10-CM | POA: Diagnosis not present

## 2018-12-11 DIAGNOSIS — J439 Emphysema, unspecified: Secondary | ICD-10-CM | POA: Insufficient documentation

## 2018-12-11 LAB — GLUCOSE, CAPILLARY: Glucose-Capillary: 87 mg/dL (ref 70–99)

## 2018-12-11 MED ORDER — FLUDEOXYGLUCOSE F - 18 (FDG) INJECTION
9.0700 | Freq: Once | INTRAVENOUS | Status: AC | PRN
  Administered 2018-12-11: 9.07 via INTRAVENOUS

## 2018-12-11 NOTE — Progress Notes (Addendum)
Thoracic Location of Tumor / Histology:    Patient presented to his PCP with complaints of worsening shortness of breath since Thanksgiving.  Chest xray was done and was unremarkable.  He was started on amoxicillin for his productive cough.  No improvement in his symptoms (cough, SOB) and he was started on oxygen.  He went to the ER with acute respiratory failure with hypoxia and hemoptysis.   PET 12/11/2018:Hypermetabolic subcarinal adenopathy at 2.7 cm and a S.U.V. max of 26.1.  Left hilar adenopathy at on the order of 2.1 x 3.7 cm and a S.U.V. max of 29.1.  Vague left lower lobe subpleural pulmonary nodule measures 1.0 cm and a S.U.V. max of 4.8  Vague left lower lobe subpleural pulmonary nodule measures 1.0 cm and a S.U.V. max of 4.8  EBUS 12/06/2018: Preliminary pathology read-Adequate, concerning for non-small cell carcinoma.  CTA 11/20/2018: No pulmonary embolism.  Left hilar mass or adenopathy measuring 1.8 x 4.4 cm and is causing narrowing of the left upper lobe and left lower lobe bronchi.  Subcarinal adenopathy measuring 2.8 x 4.4 cm.  Enlarged bilateral hilar lymph nodes.  Left lower lobe nodule measures 10 x 11 mm.  There was a ill defined low density lesion in the liver convexity, possibly metastatic disease.  Chest xray 11/20/2018: bilateral perihilar infiltrates.  Biopsies of   Tobacco/Marijuana/Snuff/ETOH use: Former smoker  Past/Anticipated interventions by Pulmonology, if any: [name] 12/05/2018 -I think we should proceed with endobronchial ultrasound-guided transbronchial needle aspiration biopsies of the patient's mediastinal adenopathy to ensure we have adequate tissue sampling to be sent for immunohistochemical staining as I suspect a advanced stage carcinoma. -We will schedule the patient for tomorrow.  We have ordered a PET scan to be completed for complete staging work-up. -Patient will return to clinic in 3-4 weeks pending pathology results from bronchoscopy. -He  will likely need referrals to oncology but pending his biopsy results.   Past/Anticipated interventions by cardiothoracic surgery, if any:   Past/Anticipated interventions by medical oncology, if any:  Dr. Julien Nordmann in lung clinic 12/19/2018  Signs/Symptoms  Weight changes, if any: Down roughly 5 pounds over the last month.  Respiratory complaints, if any: Having SOB with activities. On 2 liters of oxygen.  Hemoptysis, if any: No  Pain issues, if any: Generalized pain in his back.  No check pain voiced.  BP (!) 128/53 (Patient Position: Sitting)   Pulse 86   Temp 98.2 F (36.8 C) (Oral)   Resp 20   Ht 5\' 8"  (1.727 m)   Wt 175 lb 6.4 oz (79.6 kg)   SpO2 94%   BMI 26.67 kg/m    Wt Readings from Last 3 Encounters:  12/12/18 175 lb 6.4 oz (79.6 kg)  12/06/18 175 lb (79.4 kg)  12/05/18 180 lb 3.2 oz (81.7 kg)    SAFETY ISSUES:  Prior radiation? No  Pacemaker/ICD? No  Possible current pregnancy? No  Is the patient on methotrexate? No  Current Complaints / other details:

## 2018-12-11 NOTE — Telephone Encounter (Signed)
I did try calling Robert Dixon but she must have thought it was a robo call. I tried to get her twice. So I called Robert Dixon about his appt for brain MRI on 12/13/18 @ 3:00pm at [place] [address] in the [facility] 1st floor. He needs to be there at 2:30pm.

## 2018-12-11 NOTE — Telephone Encounter (Signed)
Called and spoke with Deanna, she stated that someone from our office called in regards to the mRI scan. [facility] please advise if you called to get this scheduled as it was ordered today and I see no other documentation thank you.

## 2018-12-12 ENCOUNTER — Ambulatory Visit
Admission: RE | Admit: 2018-12-12 | Discharge: 2018-12-12 | Disposition: A | Discharge status: Home / Self Care | Payer: Medicare HMO | Source: Ambulatory Visit | Attending: Radiation Oncology | Admitting: Radiation Oncology

## 2018-12-12 ENCOUNTER — Other Ambulatory Visit: Payer: Self-pay | Admitting: *Deleted

## 2018-12-12 ENCOUNTER — Encounter: Payer: Self-pay | Admitting: Radiation Oncology

## 2018-12-12 ENCOUNTER — Other Ambulatory Visit: Payer: Self-pay

## 2018-12-12 VITALS — BP 128/53 | HR 86 | Temp 98.2°F | Resp 20 | Ht 68.0 in | Wt 175.4 lb

## 2018-12-12 DIAGNOSIS — I6523 Occlusion and stenosis of bilateral carotid arteries: Secondary | ICD-10-CM | POA: Insufficient documentation

## 2018-12-12 DIAGNOSIS — C3412 Malignant neoplasm of upper lobe, left bronchus or lung: Secondary | ICD-10-CM

## 2018-12-12 DIAGNOSIS — J449 Chronic obstructive pulmonary disease, unspecified: Secondary | ICD-10-CM | POA: Diagnosis not present

## 2018-12-12 DIAGNOSIS — Z801 Family history of malignant neoplasm of trachea, bronchus and lung: Secondary | ICD-10-CM | POA: Insufficient documentation

## 2018-12-12 DIAGNOSIS — Z510 Encounter for antineoplastic radiation therapy: Secondary | ICD-10-CM | POA: Diagnosis not present

## 2018-12-12 DIAGNOSIS — Z8589 Personal history of malignant neoplasm of other organs and systems: Secondary | ICD-10-CM | POA: Insufficient documentation

## 2018-12-12 DIAGNOSIS — I700 Atherosclerosis of aorta: Secondary | ICD-10-CM | POA: Insufficient documentation

## 2018-12-12 DIAGNOSIS — R590 Localized enlarged lymph nodes: Secondary | ICD-10-CM

## 2018-12-12 DIAGNOSIS — K219 Gastro-esophageal reflux disease without esophagitis: Secondary | ICD-10-CM | POA: Diagnosis not present

## 2018-12-12 DIAGNOSIS — K4090 Unilateral inguinal hernia, without obstruction or gangrene, not specified as recurrent: Secondary | ICD-10-CM | POA: Diagnosis not present

## 2018-12-12 DIAGNOSIS — K5730 Diverticulosis of large intestine without perforation or abscess without bleeding: Secondary | ICD-10-CM | POA: Diagnosis not present

## 2018-12-12 DIAGNOSIS — R599 Enlarged lymph nodes, unspecified: Secondary | ICD-10-CM | POA: Diagnosis not present

## 2018-12-12 DIAGNOSIS — R0902 Hypoxemia: Secondary | ICD-10-CM | POA: Insufficient documentation

## 2018-12-12 DIAGNOSIS — Z79899 Other long term (current) drug therapy: Secondary | ICD-10-CM | POA: Insufficient documentation

## 2018-12-12 DIAGNOSIS — Z87891 Personal history of nicotine dependence: Secondary | ICD-10-CM | POA: Insufficient documentation

## 2018-12-12 DIAGNOSIS — Z85828 Personal history of other malignant neoplasm of skin: Secondary | ICD-10-CM | POA: Diagnosis not present

## 2018-12-12 DIAGNOSIS — K449 Diaphragmatic hernia without obstruction or gangrene: Secondary | ICD-10-CM | POA: Insufficient documentation

## 2018-12-12 DIAGNOSIS — Z7982 Long term (current) use of aspirin: Secondary | ICD-10-CM | POA: Diagnosis not present

## 2018-12-12 DIAGNOSIS — I10 Essential (primary) hypertension: Secondary | ICD-10-CM | POA: Insufficient documentation

## 2018-12-12 DIAGNOSIS — N400 Enlarged prostate without lower urinary tract symptoms: Secondary | ICD-10-CM | POA: Diagnosis not present

## 2018-12-12 DIAGNOSIS — R918 Other nonspecific abnormal finding of lung field: Secondary | ICD-10-CM

## 2018-12-12 HISTORY — DX: Malignant neoplasm of upper lobe, left bronchus or lung: C34.12

## 2018-12-12 NOTE — Progress Notes (Signed)
The proposed treatment discussed at cancer conference 12/12/2018 is for discussion purpose only and is not a binding recommendation.  The patient was not physically examine nor present for their treatment options.  Therefore, final treatment plans cannot be decided.

## 2018-12-12 NOTE — Progress Notes (Signed)
Radiation Oncology         (336) [phone] ________________________________  Name: Robert Dixon        MRN: [id number]  Date of Service: 12/12/2018 DOB: [birth year]  CC:[name], MD  [name], MD     REFERRING PHYSICIAN: [name], MD   DIAGNOSIS: The primary encounter diagnosis was Mediastinal adenopathy. Diagnoses of Hilar mass and Primary malignant neoplasm of left upper lobe of lung ([place]) were also pertinent to this visit.   HISTORY OF PRESENT ILLNESS: Robert Dixon is a 78 y.o. male seen at the request of [name] for new diagnosis of locally advanced lung cancer.  The patient had been experiencing increasing shortness of breath since the end of November, and a evaluation with PCP included a chest x-ray on 11/11/2018 revealed an ill-defined left infrahilar opacity representing atelectasis versus pneumonia.  He also had symptoms of hemoptysis, and a CT scanwas performed the date is unknown at the time of dictation and this information comes from pulmonary's note but CT imaging revealed a left infrahilar mass with an endobronchial lesion within the left upper lobe as well as enlarged mediastinal adenopathy.  He required 3 L of oxygen at that time, which he continues on, and was counseled on the rationale for bronchoscopy as well as a PET scan.  He underwent bronchoscopy with [name] on 12/06/2018 final pathology revealed non-small cell carcinoma within the left upper lobe brushing specimen.  He is scheduled for MRI of the brain tomorrow, but did undergo pet imaging yesterday this revealed hypermetabolic subcarinal adenopathy measuring 2.7 cm with an SUV of 26, left hilar adenopathy measuring 2.1 x 3.7 cm with an SUV of 29.1, left lower lobe subpleural pulmonary nodule measuring 1 cm with an SUV of 4.8.  He had bullous type emphysema and dependent right lower lobe opacity favored to be atelectasis.  There was also peri-bronchovascular nodularity and interstitial thickening in  the central portion of the left lower lobe, and low level hypermetabolism corresponding to mildly nodular adrenal glands no well-defined mass was identified and no additional hypermetabolic extrathoracic disease. He did have some uptake in the marrow of the spine noted during conference this am by radiology that was nonspecific. He comes today to discuss options of treatment for his cancer.     PREVIOUS RADIATION THERAPY: No   PAST MEDICAL HISTORY:  Past Medical History:  Diagnosis Date  . Cancer ([place])    skin-   . Complication of anesthesia   . COPD (chronic obstructive pulmonary disease) ([place])   . Dyspnea   . GERD (gastroesophageal reflux disease)   . Hemoptysis 11/20/2018  . History of hiatal hernia   . Hypertension   . Hypoxia 11/20/2018  . 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 Migraine with visual aura   . Pneumonia 11/20/2018  . PONV (postoperative nausea and vomiting)   . Pulmonary nodule 11/20/2018       PAST SURGICAL HISTORY: Past Surgical History:  Procedure Laterality Date  . COLONOSCOPY W/ POLYPECTOMY    . TONSILLECTOMY    . VASECTOMY    . VIDEO BRONCHOSCOPY WITH ENDOBRONCHIAL ULTRASOUND N/A 12/06/2018   Procedure: VIDEO BRONCHOSCOPY WITH ENDOBRONCHIAL ULTRASOUND;  Surgeon: Garner Nash, DO;  Location: [facility] OR;  Service: Thoracic;  Laterality: N/A;     FAMILY HISTORY:  Family History  Problem Relation Age of Onset  . Cancer - Lung Mother   . Hypertension Father   . CVA Father   . Cancer - Cervical Sister  SOCIAL HISTORY:  reports that he quit smoking about 20 years ago. His smoking use included cigarettes. He quit after 30.00 years of use. He has never used smokeless tobacco. He reports that he does not drink alcohol or use drugs. The patient is single and lives in [place]. He is accompanied by his son Robert Dixon. He is retired from truck driving.   ALLERGIES: Patient has no active allergies.   MEDICATIONS:  Current Outpatient Medications    Medication Sig Dispense Refill  . albuterol (PROVENTIL HFA;VENTOLIN HFA) 108 (90 Base) MCG/ACT inhaler Inhale 2 puffs into the lungs every 6 (six) hours as needed for wheezing or shortness of breath. 1 Inhaler 6  . amLODipine (NORVASC) 10 MG tablet Take 10 mg by mouth daily.    Marland Kitchen aspirin 81 MG tablet Take 81 mg by mouth daily.    . benazepril (LOTENSIN) 10 MG tablet Take 10 mg by mouth daily.    . meclizine (ANTIVERT) 25 MG tablet Take 25 mg by mouth 3 (three) times daily as needed for dizziness (1-2 TABS PO AS NEEDED FOR NAUSEA OR VERTIGO).    . Multiple Vitamin (MULTIVITAMIN WITH MINERALS) TABS tablet Take 1 tablet by mouth daily.    . pantoprazole (PROTONIX) 40 MG tablet Take 40 mg by mouth 2 (two) times daily.    . polycarbophil (FIBERCON) 625 MG tablet Take 625 mg by mouth 2 (two) times daily.     . vitamin C (ASCORBIC ACID) 500 MG tablet Take 500 mg by mouth daily.    Marland Kitchen HYDROcodone-homatropine (HYCODAN) 5-1.5 MG/5ML syrup Take 5 mLs by mouth every 6 (six) hours as needed for cough.      No current facility-administered medications for this encounter.      REVIEW OF SYSTEMS: On review of systems, the patient reports that he is doing well now that he has oxygen. He reports he would be significantly short of breath without it. He denies any recent hemoptysis and states it's been several weeks since seeing any blood in his sputum. He reports that this only occurred when he had long coughing fits. He denies any chest pain, fevers, chills, night sweats, unintended weight changes. He denies any bowel or bladder disturbances, and denies abdominal pain, nausea or vomiting. He denies any new musculoskeletal or joint aches or pains or headaches, seizures, gait or visual changes. A complete review of systems is obtained and is otherwise negative.     PHYSICAL EXAM:  Wt Readings from Last 3 Encounters:  12/12/18 175 lb 6.4 oz (79.6 kg)  12/06/18 175 lb (79.4 kg)  12/05/18 180 lb 3.2 oz (81.7 kg)    Temp Readings from Last 3 Encounters:  12/12/18 98.2 F (36.8 C) (Oral)  12/06/18 97.6 F (36.4 C)  04/14/13 98.1 F (36.7 C) (Oral)   BP Readings from Last 3 Encounters:  12/12/18 (!) 128/53  12/06/18 (!) 126/58  12/05/18 124/70   Pulse Readings from Last 3 Encounters:  12/12/18 86  12/06/18 88  12/05/18 82   Pain Assessment Pain Score: 0-No pain/10  He is wearing O2 at 3L with [place]  In general this is a well appearing Caucasian male in no acute distress. He is alert and oriented x4 and appropriate throughout the examination. HEENT reveals that the patient is normocephalic, atraumatic. EOMs are intact. Skin is intact without any evidence of gross lesions. Cardiovascular exam reveals a regular rate and rhythm, no clicks rubs or murmurs are auscultated. Chest is clear to auscultation bilaterally. Lymphatic assessment is  performed and does not reveal any adenopathy in the cervical, supraclavicular, axillary, or inguinal chains. Abdomen has active bowel sounds in all quadrants and is intact. The abdomen is soft, non tender, non distended. Lower extremities are negative for pretibial pitting edema, deep calf tenderness, cyanosis or clubbing.   ECOG = 2  0 - Asymptomatic (Fully active, able to carry on all predisease activities without restriction)  1 - Symptomatic but completely ambulatory (Restricted in physically strenuous activity but ambulatory and able to carry out work of a light or sedentary nature. For example, light housework, office work)  2 - Symptomatic, <50% in bed during the day (Ambulatory and capable of all self care but unable to carry out any work activities. Up and about more than 50% of waking hours)  3 - Symptomatic, >50% in bed, but not bedbound (Capable of only limited self-care, confined to bed or chair 50% or more of waking hours)  4 - Bedbound (Completely disabled. Cannot carry on any self-care. Totally confined to bed or chair)  5 - Death   Eustace Pen MM,  Creech RH, Tormey DC, et al. [id number]). "Toxicity and response criteria of the [facility]". [place] Oncol. 5 (6): 649-55    LABORATORY DATA:  Lab Results  Component Value Date   WBC 12.8 (H) 12/06/2018   HGB 10.8 (L) 12/06/2018   HCT 33.6 (L) 12/06/2018   MCV 109.4 (H) 12/06/2018   PLT 192 12/06/2018   Lab Results  Component Value Date   NA 137 12/06/2018   K 4.5 12/06/2018   CL 108 12/06/2018   CO2 23 12/06/2018   Lab Results  Component Value Date   ALT 27 12/06/2018   AST 20 12/06/2018   ALKPHOS 75 12/06/2018   BILITOT 1.0 12/06/2018      RADIOGRAPHY: Nm Pet Image Initial (pi) Skull Base To Thigh  Result Date: 12/11/2018 CLINICAL DATA:  Initial treatment strategy for Hilar lung mass. Mediastinal adenopathy. Concern for advanced lung cancer. EXAM: NUCLEAR MEDICINE PET SKULL BASE TO THIGH TECHNIQUE: 9.1 mCi F-18 FDG was injected intravenously. Full-ring PET imaging was performed from the skull base to thigh after the radiotracer. CT data was obtained and used for attenuation correction and anatomic localization. Fasting blood glucose: 87 mg/dl COMPARISON:  Chest radiograph 11/11/2018.  No prior CT or PET. FINDINGS: Mediastinal blood pool activity: SUV max 2.4 NECK: No areas of abnormal hypermetabolism. Incidental CT findings: Bilateral carotid atherosclerosis. No cervical adenopathy. CHEST: Hypermetabolic subcarinal adenopathy at 2.7 cm and a S.U.V. max of 26.1 on image 105/3. Left hilar adenopathy at on the order of 2.1 x 3.7 cm and a S.U.V. max of 29.1 on image 107/3. Vague left lower lobe subpleural pulmonary nodule measures 1.0 cm and a S.U.V. max of 4.8 on image 114/3. Incidental CT findings: Aortic and coronary artery atherosclerosis. Bullous type emphysema. Minimal dependent right lower lobe pulmonary opacity, favored to represent atelectasis. Posterior lingular and central left lower lobe peribronchovascular nodularity and interstitial thickening.  ABDOMEN/PELVIS: Low-level hypermetabolism corresponding to mildly nodular adrenal glands bilaterally. No well-defined dominant mass. No abdominopelvic nodal hypermetabolism. Incidental CT findings: Abdominal aortic atherosclerosis. A segment 4 low-density 12 mm lever lesion is favored to represent a cyst or minimally complex cyst. Mild renal cortical thinning bilaterally. Bilateral low-density renal lesions are likely cysts. There also left renal lesions which demonstrate complexity and are therefore indeterminate. Example exophytic off the upper pole left kidney at 2.8 cm. Abdominal aortic and branch vessel atherosclerosis. Extensive colonic  diverticulosis. Mild to moderate prostatomegaly. Fat containing left inguinal hernia. SKELETON: Relatively diffuse, mild marrow hypermetabolism. Incidental CT findings: No focal osseous lesion. IMPRESSION: 1. Hypermetabolic mediastinal and left hilar adenopathy. Differential considerations include small-cell lung cancer or a lymphoproliferative process such as lymphoma. 2. Reticulonodular lingular and central left lower lobe opacities are likely related to postobstructive pneumonitis from left hilar adenopathy. 3. Hypermetabolic subpleural left lower lobe pulmonary nodule which could represent a primary bronchogenic carcinoma (i.e. Small-cell lung cancer can present in this way) or also be related to postobstructive pneumonitis. 4. No hypermetabolic extrathoracic metastasis. 5. Aortic atherosclerosis (ICD10-I70.0), coronary artery atherosclerosis and emphysema (ICD10-J43.9). 6. Indeterminate left renal lesions which could represent complex cysts or solid neoplasms. Consider nonemergent pre and post contrast abdominal MRI (preferred) or CT. 7. Low-level hypermetabolism within both adrenal glands, without well-defined dominant mass. Favored to be physiologic. Recommend attention on follow-up. Electronically Signed   By: Abigail Miyamoto M.D.   On: 12/11/2018 14:31        IMPRESSION/PLAN: 1. 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 Dr. Lisbeth Renshaw discusses the pathology findings and reviews the nature of non  lung disease. We agree with proceeding with Brain MRI scan as well. His case was discussed in conference and he would benefit from concurrent chemoRT. We discussed the risks, benefits, short, and long term effects of radiotherapy, and the patient is interested in proceeding. Dr. Lisbeth Renshaw discusses the delivery and logistics of radiotherapy and anticipates a course of 6 1/2 weeks of radiotherapy. Written consent is obtained and placed in the chart, a copy was provided to the patient. He will simulate following today's appointment. We anticipate his treatment to begin on 12/23/2018. We will also follow up with his MRI results.    The above documentation reflects my direct findings during this shared patient visit. Please see the separate note by Dr. Lisbeth Renshaw on this date for the remainder of the patient's plan of care.    Carola Rhine, PAC

## 2018-12-13 ENCOUNTER — Ambulatory Visit (HOSPITAL_COMMUNITY)
Admission: RE | Admit: 2018-12-13 | Discharge: 2018-12-13 | Disposition: A | Discharge status: Home / Self Care | Payer: Medicare HMO | Source: Ambulatory Visit | Attending: Pulmonary Disease | Admitting: Pulmonary Disease

## 2018-12-13 DIAGNOSIS — C3490 Malignant neoplasm of unspecified part of unspecified bronchus or lung: Secondary | ICD-10-CM | POA: Diagnosis not present

## 2018-12-13 MED ORDER — GADOBUTROL 1 MMOL/ML IV SOLN
10.0000 mL | Freq: Once | INTRAVENOUS | Status: AC | PRN
  Administered 2018-12-13: 10 mL via INTRAVENOUS

## 2018-12-15 ENCOUNTER — Telehealth: Payer: Self-pay | Admitting: Radiation Oncology

## 2018-12-15 NOTE — Telephone Encounter (Signed)
I called and LM for daughter in law and son that MRI was clear of metastatic disease. I also called son [name] and LM sharing the same information.

## 2018-12-16 ENCOUNTER — Encounter: Admission: RE | Admit: 2018-12-16 | Payer: Medicare HMO | Source: Ambulatory Visit

## 2018-12-17 ENCOUNTER — Encounter (HOSPITAL_COMMUNITY): Payer: Medicare HMO

## 2018-12-19 ENCOUNTER — Encounter: Payer: Self-pay | Admitting: Internal Medicine

## 2018-12-19 ENCOUNTER — Inpatient Hospital Stay: Payer: Medicare HMO | Attending: Internal Medicine | Admitting: Internal Medicine

## 2018-12-19 ENCOUNTER — Inpatient Hospital Stay: Payer: Medicare HMO

## 2018-12-19 ENCOUNTER — Other Ambulatory Visit: Payer: Self-pay | Admitting: Internal Medicine

## 2018-12-19 VITALS — BP 110/43 | HR 81 | Temp 98.5°F | Resp 16 | Ht 68.0 in | Wt 176.5 lb

## 2018-12-19 DIAGNOSIS — R5383 Other fatigue: Secondary | ICD-10-CM | POA: Diagnosis not present

## 2018-12-19 DIAGNOSIS — C3412 Malignant neoplasm of upper lobe, left bronchus or lung: Secondary | ICD-10-CM

## 2018-12-19 DIAGNOSIS — K219 Gastro-esophageal reflux disease without esophagitis: Secondary | ICD-10-CM | POA: Insufficient documentation

## 2018-12-19 DIAGNOSIS — J449 Chronic obstructive pulmonary disease, unspecified: Secondary | ICD-10-CM

## 2018-12-19 DIAGNOSIS — C3432 Malignant neoplasm of lower lobe, left bronchus or lung: Secondary | ICD-10-CM | POA: Diagnosis not present

## 2018-12-19 DIAGNOSIS — I2510 Atherosclerotic heart disease of native coronary artery without angina pectoris: Secondary | ICD-10-CM | POA: Insufficient documentation

## 2018-12-19 DIAGNOSIS — Z79899 Other long term (current) drug therapy: Secondary | ICD-10-CM | POA: Diagnosis not present

## 2018-12-19 DIAGNOSIS — I700 Atherosclerosis of aorta: Secondary | ICD-10-CM

## 2018-12-19 DIAGNOSIS — R634 Abnormal weight loss: Secondary | ICD-10-CM | POA: Diagnosis not present

## 2018-12-19 DIAGNOSIS — Z87891 Personal history of nicotine dependence: Secondary | ICD-10-CM | POA: Diagnosis not present

## 2018-12-19 DIAGNOSIS — Z5111 Encounter for antineoplastic chemotherapy: Secondary | ICD-10-CM

## 2018-12-19 DIAGNOSIS — I10 Essential (primary) hypertension: Secondary | ICD-10-CM | POA: Insufficient documentation

## 2018-12-19 DIAGNOSIS — Z8701 Personal history of pneumonia (recurrent): Secondary | ICD-10-CM

## 2018-12-19 DIAGNOSIS — Z7189 Other specified counseling: Secondary | ICD-10-CM | POA: Insufficient documentation

## 2018-12-19 DIAGNOSIS — C3492 Malignant neoplasm of unspecified part of left bronchus or lung: Secondary | ICD-10-CM

## 2018-12-19 DIAGNOSIS — R590 Localized enlarged lymph nodes: Secondary | ICD-10-CM | POA: Diagnosis not present

## 2018-12-19 HISTORY — DX: Malignant neoplasm of unspecified part of left bronchus or lung: C34.92

## 2018-12-19 MED ORDER — PROCHLORPERAZINE MALEATE 10 MG PO TABS: 10.0000 mg | ORAL_TABLET | Freq: Four times a day (QID) | ORAL | 0 refills | Status: DC | PRN

## 2018-12-19 NOTE — Progress Notes (Signed)
  Radiation Oncology         (336) [phone] ________________________________  Name: Robert Dixon MRN: [id number]  Date: 12/12/2018  DOB: 09/03/1941  SIMULATION AND TREATMENT PLANNING NOTE  DIAGNOSIS:     ICD-10-CM   1. Primary malignant neoplasm of left upper lobe of lung (HCC) C34.12      Site:  chest  NARRATIVE:  The patient was brought to the [facility].  Identity was confirmed.  All relevant records and images related to the planned course of therapy were reviewed.   Written consent to proceed with treatment was confirmed which was freely given after reviewing the details related to the planned course of therapy had been reviewed with the patient.  Then, the patient was set-up in a stable reproducible  supine position for radiation therapy.  CT images were obtained.  Surface markings were placed.    Medically necessary complex treatment device(s) for immobilization:  Vac-lock bag.   The CT images were loaded into the planning software.  Then the target and avoidance structures were contoured.  Treatment planning then occurred.  The radiation prescription was entered and confirmed.  A total of 5 complex treatment devices were fabricated which relate to the designed radiation treatment fields. Additional reduced fields will be used as necessary to improve the dose homogeneity of the plan. Each of these customized fields/ complex treatment devices will be used on a daily basis during the radiation course. I have requested : 3D Simulation  I have requested a DVH of the following structures: target volume, spinal cord, lungs, heart.   The patient will undergo daily image guidance to ensure accurate localization of the target, and adequate minimize dose to the normal surrounding structures in close proximity to the target.  PLAN:  The patient will receive 60 Gy in 30 fractions initially. The patient will then receive a 6 Gy boost for a final dose of 66 Gy.  Special treatment  procedure The patient will also receive concurrent chemotherapy during the treatment. The patient may therefore experience increased toxicity or side effects and the patient will be monitored for such problems. This may require extra lab work as necessary. This therefore constitutes a special treatment procedure.   ________________________________   Jodelle Gross, MD, PhD

## 2018-12-19 NOTE — Progress Notes (Signed)
  Radiation Oncology         (336) [phone] ________________________________  Name: Robert Dixon MRN: [id number]  Date: 12/12/2018  DOB: 1941-08-19  RESPIRATORY MOTION MANAGEMENT SIMULATION  NARRATIVE:  In order to account for effect of respiratory motion on target structures and other organs in the planning and delivery of radiotherapy, this patient underwent respiratory motion management simulation.  To accomplish this, when the patient was brought to the CT simulation planning suite, 4D respiratoy motion management CT images were obtained.  The CT images were loaded into the planning software.  Then, using a variety of tools including Cine, MIP, and standard views, the target volume and planning target volumes (PTV) were delineated.  Avoidance structures were contoured.  Treatment planning then occurred.  Dose volume histograms were generated and reviewed for each of the requested structure.  The resulting plan was carefully reviewed and approved today.   ------------------------------------------------  Jodelle Gross, MD, PhD

## 2018-12-19 NOTE — Progress Notes (Signed)
[facility] Telephone:([phone]) [phone]   [phone] Multidisciplinary thoracic oncology clinic  CONSULT NOTE  REFERRING PHYSICIAN: Dr. Leory Plowman Icard  REASON FOR CONSULTATION:  78 years old white male recently diagnosed with lung cancer.  HPI Robert Dixon is a 78 y.o. male with past medical history significant for COPD, hypertension, skin cancer, migraine, GERD and history of smoking but quit 20 years ago.  The patient mentions that he has been complaining of cough and shortness of breath since Thanksgiving of 2019.  He was seen by his primary care physician and treated for bronchitis with cough medicine and steroids.  This was followed by treatment with antibiotics with no improvement.  Chest x-ray on November 11, 2018 was unremarkable.  The patient presented to the emergency department on November 20, 2018.  He had CT scan of the chest at that time and it showed a left hilar mass or adenopathy measuring 1.8 x 4.4 cm and causing narrowing of the left upper lobe and left lower lobe bronchi.  There was also a left lower lobe nodule measuring 1.0 x 1.1 cm.  The scan also showed subcarinal adenopathy measuring 2.8 x 4.4 cm and enlarged with bilateral hilar nodes.  The patient was referred to [name] and he underwent flexible video fiberoptic bronchoscopy with endobronchial ultrasound and biopsies on December 06, 2018.  The final cytology ([id number]) showed malignant cells consistent with non-small cell carcinoma.  On December 11, 2018 the patient had a PET scan and that showed vague left lower lobe subpleural pulmonary nodule measuring 1.0 cm with SUV max of 4.8.  The scan also showed hypermetabolic left hilar adenopathy measuring 2.1 x 3.7 cm with SUV max of 29.1 as well as hypermetabolic subcarinal lymphadenopathy measuring 2.7 cm with SUV max of 26.1.  No hypermetabolic extrathoracic metastases identified.  The patient also had MRI of the brain on December 13, 2018 and this was  negative for metastatic disease to the brain.  [name] kindly referred the patient to the multidisciplinary thoracic oncology clinic today for evaluation and recommendation regarding treatment of his condition. The patient was seen by Dr. Lisbeth Renshaw from radiation oncology and he is expected to start concurrent radiotherapy next week. When seen today the patient is feeling fine with no concerning complaints except for the shortness of breath with exertion and mild cough with no hemoptysis.  He has no chest pain.  The patient has a weight loss of around 15 pounds in the last 6 weeks.  He denied having any nausea, vomiting, diarrhea or constipation.  He denied having any headache or visual changes. Family history significant for mother with lung cancer and died at age 42.  Father had hypertension and stroke.  Sister had cervical cancer. The patient is a widower.  He has 2 sons and he was accompanied today by his son Robert Dixon.  The patient is currently retired and used to work in several jobs in the past.  He has a history of smoking 1.5 pack/day for around 40 years and quit 20 years ago.  No history of alcohol or drug abuse. HPI  Past Medical History:  Diagnosis Date  . Cancer ([place])    skin-   . Complication of anesthesia   . COPD (chronic obstructive pulmonary disease) ([place])   . Dyspnea   . GERD (gastroesophageal reflux disease)   . Hemoptysis 11/20/2018  . History of hiatal hernia   . Hypertension   . Hypoxia 11/20/2018  . 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 Migraine with visual aura   . Pneumonia 11/20/2018  . PONV (postoperative nausea and vomiting)   . Pulmonary nodule 11/20/2018    Past Surgical History:  Procedure Laterality Date  . COLONOSCOPY W/ POLYPECTOMY    . TONSILLECTOMY    . VASECTOMY    . VIDEO BRONCHOSCOPY WITH ENDOBRONCHIAL ULTRASOUND N/A 12/06/2018   Procedure: VIDEO BRONCHOSCOPY WITH ENDOBRONCHIAL ULTRASOUND;  Surgeon: Garner Nash, DO;  Location: [facility] OR;  Service:  Thoracic;  Laterality: N/A;    Family History  Problem Relation Age of Onset  . Cancer - Lung Mother   . Hypertension Father   . CVA Father   . Cancer - Cervical Sister     Social History Social History   Tobacco Use  . Smoking status: Former Smoker    Years: 30.00    Types: Cigarettes    Last attempt to quit: 2000    Years since quitting: 20.0  . Smokeless tobacco: Never Used  Substance Use Topics  . Alcohol use: No  . Drug use: No    No Active Allergies  Current Outpatient Medications  Medication Sig Dispense Refill  . dimenhyDRINATE (DRAMAMINE) 50 MG tablet Take 50 mg by mouth every 8 (eight) hours as needed.    Marland Kitchen albuterol (PROVENTIL HFA;VENTOLIN HFA) 108 (90 Base) MCG/ACT inhaler Inhale 2 puffs into the lungs every 6 (six) hours as needed for wheezing or shortness of breath. 1 Inhaler 6  . amLODipine (NORVASC) 10 MG tablet Take 10 mg by mouth daily.    Marland Kitchen aspirin 81 MG tablet Take 81 mg by mouth daily.    . benazepril (LOTENSIN) 10 MG tablet Take 10 mg by mouth daily.    Marland Kitchen HYDROcodone-homatropine (HYCODAN) 5-1.5 MG/5ML syrup Take 5 mLs by mouth every 6 (six) hours as needed for cough.     . Multiple Vitamin (MULTIVITAMIN WITH MINERALS) TABS tablet Take 1 tablet by mouth daily.    . pantoprazole (PROTONIX) 40 MG tablet Take 40 mg by mouth 2 (two) times daily.    . polycarbophil (FIBERCON) 625 MG tablet Take 625 mg by mouth 2 (two) times daily.     . vitamin C (ASCORBIC ACID) 500 MG tablet Take 500 mg by mouth daily.     No current facility-administered medications for this visit.     Review of Systems  Constitutional: positive for fatigue and weight loss Eyes: negative Ears, nose, mouth, throat, and face: negative Respiratory: positive for cough, dyspnea on exertion and hemoptysis Cardiovascular: negative Gastrointestinal: negative Genitourinary:negative Integument/breast: negative Hematologic/lymphatic: negative Musculoskeletal:positive for muscle  weakness Neurological: negative Behavioral/Psych: negative Endocrine: negative Allergic/Immunologic: negative  Physical Exam  YKD:XIPJA, healthy, no distress, well nourished and well developed SKIN: skin color, texture, turgor are normal, no rashes or significant lesions HEAD: Normocephalic, No masses, lesions, tenderness or abnormalities EYES: normal, PERRLA, Conjunctiva are pink and non-injected EARS: External ears normal, Canals clear OROPHARYNX:no exudate, no erythema and lips, buccal mucosa, and tongue normal  NECK: supple, no adenopathy, no JVD LYMPH:  no palpable lymphadenopathy, no hepatosplenomegaly LUNGS: clear to auscultation , and palpation HEART: regular rate & rhythm, no murmurs and no gallops ABDOMEN:abdomen soft, non-tender, obese, normal bowel sounds and no masses or organomegaly BACK: No CVA tenderness, Range of motion is normal EXTREMITIES:no joint deformities, effusion, or inflammation, no edema  NEURO: alert & oriented x 3 with fluent speech, no focal motor/sensory deficits  PERFORMANCE STATUS: ECOG 1-2  LABORATORY DATA: Lab Results  Component Value Date  WBC 12.8 (H) 12/06/2018   HGB 10.8 (L) 12/06/2018   HCT 33.6 (L) 12/06/2018   MCV 109.4 (H) 12/06/2018   PLT 192 12/06/2018      Chemistry      Component Value Date/Time   NA 137 12/06/2018 1126   K 4.5 12/06/2018 1126   CL 108 12/06/2018 1126   CO2 23 12/06/2018 1126   BUN 23 12/06/2018 1126   CREATININE 0.98 12/06/2018 1126      Component Value Date/Time   CALCIUM 8.5 (L) 12/06/2018 1126   ALKPHOS 75 12/06/2018 1126   AST 20 12/06/2018 1126   ALT 27 12/06/2018 1126   BILITOT 1.0 12/06/2018 1126       RADIOGRAPHIC STUDIES: Mr Jeri Cos IO Contrast  Result Date: 12/13/2018 CLINICAL DATA:  Non-small cell lung cancer.  Staging. EXAM: MRI HEAD WITHOUT AND WITH CONTRAST TECHNIQUE: Multiplanar, multiecho pulse sequences of the brain and surrounding structures were obtained without and with  intravenous contrast. CONTRAST:  10 cc Gadavist COMPARISON:  None. FINDINGS: Brain: Diffusion imaging does not show any acute or subacute infarction. There is moderate generalized atrophy. There are mild chronic small-vessel ischemic changes of the cerebral hemispheric white matter. There is no evidence of metastatic disease. No hemorrhage, hydrocephalus or extra-axial collection. Vascular: Major vessels at the base of the brain show flow. Skull and upper cervical spine: Negative Sinuses/Orbits: Sinuses are clear except for a small amount of fluid in the right maxillary sinus. Orbits appear normal. Small amount of fluid in the left mastoid tip air cells. Other: None IMPRESSION: No evidence of metastatic disease. Atrophy and chronic small-vessel ischemic changes. Electronically Signed   By: Nelson Chimes M.D.   On: 12/13/2018 16:22   Nm Pet Image Initial (pi) Skull Base To Thigh  Result Date: 12/11/2018 CLINICAL DATA:  Initial treatment strategy for Hilar lung mass. Mediastinal adenopathy. Concern for advanced lung cancer. EXAM: NUCLEAR MEDICINE PET SKULL BASE TO THIGH TECHNIQUE: 9.1 mCi F-18 FDG was injected intravenously. Full-ring PET imaging was performed from the skull base to thigh after the radiotracer. CT data was obtained and used for attenuation correction and anatomic localization. Fasting blood glucose: 87 mg/dl COMPARISON:  Chest radiograph 11/11/2018.  No prior CT or PET. FINDINGS: Mediastinal blood pool activity: SUV max 2.4 NECK: No areas of abnormal hypermetabolism. Incidental CT findings: Bilateral carotid atherosclerosis. No cervical adenopathy. CHEST: Hypermetabolic subcarinal adenopathy at 2.7 cm and a S.U.V. max of 26.1 on image 105/3. Left hilar adenopathy at on the order of 2.1 x 3.7 cm and a S.U.V. max of 29.1 on image 107/3. Vague left lower lobe subpleural pulmonary nodule measures 1.0 cm and a S.U.V. max of 4.8 on image 114/3. Incidental CT findings: Aortic and coronary artery  atherosclerosis. Bullous type emphysema. Minimal dependent right lower lobe pulmonary opacity, favored to represent atelectasis. Posterior lingular and central left lower lobe peribronchovascular nodularity and interstitial thickening. ABDOMEN/PELVIS: Low-level hypermetabolism corresponding to mildly nodular adrenal glands bilaterally. No well-defined dominant mass. No abdominopelvic nodal hypermetabolism. Incidental CT findings: Abdominal aortic atherosclerosis. A segment 4 low-density 12 mm lever lesion is favored to represent a cyst or minimally complex cyst. Mild renal cortical thinning bilaterally. Bilateral low-density renal lesions are likely cysts. There also left renal lesions which demonstrate complexity and are therefore indeterminate. Example exophytic off the upper pole left kidney at 2.8 cm. Abdominal aortic and branch vessel atherosclerosis. Extensive colonic diverticulosis. Mild to moderate prostatomegaly. Fat containing left inguinal hernia. SKELETON: Relatively diffuse, mild marrow hypermetabolism. Incidental  CT findings: No focal osseous lesion. IMPRESSION: 1. Hypermetabolic mediastinal and left hilar adenopathy. Differential considerations include small-cell lung cancer or a lymphoproliferative process such as lymphoma. 2. Reticulonodular lingular and central left lower lobe opacities are likely related to postobstructive pneumonitis from left hilar adenopathy. 3. Hypermetabolic subpleural left lower lobe pulmonary nodule which could represent a primary bronchogenic carcinoma (i.e. Small-cell lung cancer can present in this way) or also be related to postobstructive pneumonitis. 4. No hypermetabolic extrathoracic metastasis. 5. Aortic atherosclerosis (ICD10-I70.0), coronary artery atherosclerosis and emphysema (ICD10-J43.9). 6. Indeterminate left renal lesions which could represent complex cysts or solid neoplasms. Consider nonemergent pre and post contrast abdominal MRI (preferred) or CT. 7.  Low-level hypermetabolism within both adrenal glands, without well-defined dominant mass. Favored to be physiologic. Recommend attention on follow-up. Electronically Signed   By: Abigail Miyamoto M.D.   On: 12/11/2018 14:31    ASSESSMENT: This is a very pleasant 78 years old white male recently diagnosed with a stage IIIb (T1b, N3, M0) non-small cell lung cancer favoring adenocarcinoma diagnosed in January 2020 and presented with left lower lobe pulmonary nodule in addition to bilateral hilar and subcarinal lymphadenopathy.   PLAN: I had a lengthy discussion with the patient and his son today about his current disease stage, prognosis and treatment options. I personally and independently reviewed the scan images and discussed the result and showed the images to the patient and his son. I discussed with the patient his treatment options and recommended for him a course of concurrent chemoradiation with weekly carboplatin for AUC of 2 and paclitaxel 45 mg/M2.  This will be followed by consolidation immunotherapy if the patient has no evidence for disease progression after the induction phase. I discussed with the patient the adverse effect of the chemotherapy including but not limited to alopecia, myelosuppression, nausea and vomiting, peripheral neuropathy, liver or renal dysfunction. I will also arrange for the patient to have blood test for molecular studies since he has insufficient material for molecular testing. The patient is expected to start the first cycle of his concurrent chemotherapy on December 30, 2018 and he is expected to start the first radiation next week. I will arrange for the patient to have a chemotherapy education class before the first dose of his treatment. I will send prescription for Compazine 10 mg p.o. every 6 hours as needed for nausea to his pharmacy. The patient will come back for follow-up visit 1 week after his treatment for reevaluation and management of any adverse effect  of his treatment. The patient was advised to call immediately if he has any concerning symptoms in the interval. The patient voices understanding of current disease status and treatment options and is in agreement with the current care plan.  All questions were answered. The patient knows to call the clinic with any problems, questions or concerns. We can certainly see the patient much sooner if necessary.  Thank you so much for allowing me to participate in the care of Robert Dixon. I will continue to follow up the patient with you and assist in his care.  I spent 55 minutes counseling the patient face to face. The total time spent in the appointment was 80 minutes.  Disclaimer: This note was dictated with voice recognition software. Similar sounding words can inadvertently be transcribed and may not be corrected upon review.   Eilleen Kempf December 19, 2018, 3:52 PM

## 2018-12-19 NOTE — Progress Notes (Signed)
START ON PATHWAY REGIMEN - Non-Small Cell Lung     Administer weekly:     Paclitaxel      Carboplatin   **Always confirm dose/schedule in your pharmacy ordering system**  Patient Characteristics: Stage III - Unresectable, PS = 0, 1 AJCC T Category: T1b Current Disease Status: No Distant Mets or Local Recurrence AJCC N Category: N3 AJCC M Category: M0 AJCC 8 Stage Grouping: IIIB Performance Status: PS = 0, 1 Intent of Therapy: Curative Intent, Discussed with Patient

## 2018-12-20 DIAGNOSIS — Z510 Encounter for antineoplastic radiation therapy: Secondary | ICD-10-CM | POA: Diagnosis not present

## 2018-12-20 DIAGNOSIS — C3412 Malignant neoplasm of upper lobe, left bronchus or lung: Secondary | ICD-10-CM | POA: Diagnosis not present

## 2018-12-23 ENCOUNTER — Telehealth: Payer: Self-pay | Admitting: Radiation Oncology

## 2018-12-23 ENCOUNTER — Ambulatory Visit
Admission: RE | Admit: 2018-12-23 | Discharge: 2018-12-23 | Disposition: A | Discharge status: Home / Self Care | Payer: Medicare HMO | Source: Ambulatory Visit | Attending: Radiation Oncology | Admitting: Radiation Oncology

## 2018-12-23 ENCOUNTER — Telehealth: Payer: Self-pay | Admitting: *Deleted

## 2018-12-23 DIAGNOSIS — R590 Localized enlarged lymph nodes: Secondary | ICD-10-CM | POA: Diagnosis not present

## 2018-12-23 DIAGNOSIS — I10 Essential (primary) hypertension: Secondary | ICD-10-CM | POA: Diagnosis not present

## 2018-12-23 DIAGNOSIS — I700 Atherosclerosis of aorta: Secondary | ICD-10-CM | POA: Diagnosis not present

## 2018-12-23 DIAGNOSIS — I2510 Atherosclerotic heart disease of native coronary artery without angina pectoris: Secondary | ICD-10-CM | POA: Diagnosis not present

## 2018-12-23 DIAGNOSIS — Z510 Encounter for antineoplastic radiation therapy: Secondary | ICD-10-CM | POA: Diagnosis not present

## 2018-12-23 DIAGNOSIS — Z87891 Personal history of nicotine dependence: Secondary | ICD-10-CM | POA: Diagnosis not present

## 2018-12-23 DIAGNOSIS — C3432 Malignant neoplasm of lower lobe, left bronchus or lung: Secondary | ICD-10-CM | POA: Diagnosis not present

## 2018-12-23 DIAGNOSIS — C3412 Malignant neoplasm of upper lobe, left bronchus or lung: Secondary | ICD-10-CM | POA: Diagnosis not present

## 2018-12-23 DIAGNOSIS — R5383 Other fatigue: Secondary | ICD-10-CM | POA: Diagnosis not present

## 2018-12-23 DIAGNOSIS — R634 Abnormal weight loss: Secondary | ICD-10-CM | POA: Diagnosis not present

## 2018-12-23 DIAGNOSIS — J449 Chronic obstructive pulmonary disease, unspecified: Secondary | ICD-10-CM | POA: Diagnosis not present

## 2018-12-23 LAB — CBC WITH DIFFERENTIAL (CANCER CENTER ONLY)
Abs Immature Granulocytes: 0.23 10*3/uL — ABNORMAL HIGH (ref 0.00–0.07)
Basophils Absolute: 0.1 10*3/uL (ref 0.0–0.1)
Basophils Relative: 1 %
Eosinophils Absolute: 0.1 10*3/uL (ref 0.0–0.5)
Eosinophils Relative: 0 %
HCT: 31.5 % — ABNORMAL LOW (ref 39.0–52.0)
Hemoglobin: 10.2 g/dL — ABNORMAL LOW (ref 13.0–17.0)
Immature Granulocytes: 2 %
Lymphocytes Relative: 11 %
Lymphs Abs: 1.7 10*3/uL (ref 0.7–4.0)
MCH: 34.8 pg — ABNORMAL HIGH (ref 26.0–34.0)
MCHC: 32.4 g/dL (ref 30.0–36.0)
MCV: 107.5 fL — ABNORMAL HIGH (ref 80.0–100.0)
Monocytes Absolute: 1 10*3/uL (ref 0.1–1.0)
Monocytes Relative: 6 %
Neutro Abs: 11.8 10*3/uL — ABNORMAL HIGH (ref 1.7–7.7)
Neutrophils Relative %: 80 %
Platelet Count: 359 10*3/uL (ref 150–400)
RBC: 2.93 MIL/uL — ABNORMAL LOW (ref 4.22–5.81)
RDW: 15.8 % — ABNORMAL HIGH (ref 11.5–15.5)
WBC Count: 14.8 10*3/uL — ABNORMAL HIGH (ref 4.0–10.5)
nRBC: 0 % (ref 0.0–0.2)

## 2018-12-23 NOTE — Telephone Encounter (Signed)
We will check cbc given that pt called stating he was having some increased coughing for hemoptysis. He's to start treatment today with XRT, but two weeks ago Hgb was in 10 range. We will check to anticipate if he will need blood products.

## 2018-12-23 NOTE — Telephone Encounter (Signed)
Called the patients daughter-in-law to let her know about his lab work that was done this AM.  I told her per PA Dara Lords there is nothing to be done at this time and we will keep a check on his labs.  I let her know that if Robert Dixon's bleeding worsens to call us and let us know.  Will continue to follow as necessary.  Gloriajean Dell. Leonie Green, BSN

## 2018-12-24 ENCOUNTER — Telehealth: Payer: Self-pay | Admitting: Internal Medicine

## 2018-12-24 ENCOUNTER — Telehealth: Payer: Self-pay | Admitting: *Deleted

## 2018-12-24 ENCOUNTER — Ambulatory Visit
Admission: RE | Admit: 2018-12-24 | Discharge: 2018-12-24 | Disposition: A | Discharge status: Home / Self Care | Payer: Medicare HMO | Source: Ambulatory Visit | Attending: Radiation Oncology | Admitting: Radiation Oncology

## 2018-12-24 DIAGNOSIS — C3412 Malignant neoplasm of upper lobe, left bronchus or lung: Secondary | ICD-10-CM | POA: Diagnosis not present

## 2018-12-24 DIAGNOSIS — Z510 Encounter for antineoplastic radiation therapy: Secondary | ICD-10-CM | POA: Diagnosis not present

## 2018-12-24 NOTE — Telephone Encounter (Signed)
CSR from [facility] called to advise, specimen for lab test was received however the specimen bag was not properly sealed prior to being shipped per [facility]. Tests will still be run on specimen, this was a courtesy call from [facility]. Message forward to [facility]

## 2018-12-24 NOTE — Telephone Encounter (Signed)
Called patient to confirm appts per 1/23 los - pt is aware of chemo edu class and 1st treatment date.

## 2018-12-25 ENCOUNTER — Ambulatory Visit
Admission: RE | Admit: 2018-12-25 | Discharge: 2018-12-25 | Disposition: A | Discharge status: Home / Self Care | Payer: Medicare HMO | Source: Ambulatory Visit | Attending: Radiation Oncology | Admitting: Radiation Oncology

## 2018-12-25 DIAGNOSIS — Z7982 Long term (current) use of aspirin: Secondary | ICD-10-CM | POA: Diagnosis not present

## 2018-12-25 DIAGNOSIS — Z79891 Long term (current) use of opiate analgesic: Secondary | ICD-10-CM | POA: Diagnosis not present

## 2018-12-25 DIAGNOSIS — Z510 Encounter for antineoplastic radiation therapy: Secondary | ICD-10-CM | POA: Diagnosis not present

## 2018-12-25 DIAGNOSIS — Z792 Long term (current) use of antibiotics: Secondary | ICD-10-CM | POA: Diagnosis not present

## 2018-12-25 DIAGNOSIS — R911 Solitary pulmonary nodule: Secondary | ICD-10-CM | POA: Diagnosis not present

## 2018-12-25 DIAGNOSIS — Z87891 Personal history of nicotine dependence: Secondary | ICD-10-CM | POA: Diagnosis not present

## 2018-12-25 DIAGNOSIS — J440 Chronic obstructive pulmonary disease with acute lower respiratory infection: Secondary | ICD-10-CM | POA: Diagnosis not present

## 2018-12-25 DIAGNOSIS — C3412 Malignant neoplasm of upper lobe, left bronchus or lung: Secondary | ICD-10-CM | POA: Diagnosis not present

## 2018-12-25 DIAGNOSIS — K219 Gastro-esophageal reflux disease without esophagitis: Secondary | ICD-10-CM | POA: Diagnosis not present

## 2018-12-25 DIAGNOSIS — J189 Pneumonia, unspecified organism: Secondary | ICD-10-CM | POA: Diagnosis not present

## 2018-12-25 DIAGNOSIS — I10 Essential (primary) hypertension: Secondary | ICD-10-CM | POA: Diagnosis not present

## 2018-12-26 ENCOUNTER — Telehealth: Payer: Self-pay | Admitting: Radiation Oncology

## 2018-12-26 ENCOUNTER — Ambulatory Visit
Admission: RE | Admit: 2018-12-26 | Discharge: 2018-12-26 | Disposition: A | Discharge status: Home / Self Care | Payer: Medicare HMO | Source: Ambulatory Visit | Attending: Radiation Oncology | Admitting: Radiation Oncology

## 2018-12-26 ENCOUNTER — Encounter: Payer: Self-pay | Admitting: General Practice

## 2018-12-26 DIAGNOSIS — C3412 Malignant neoplasm of upper lobe, left bronchus or lung: Secondary | ICD-10-CM | POA: Diagnosis not present

## 2018-12-26 DIAGNOSIS — Z510 Encounter for antineoplastic radiation therapy: Secondary | ICD-10-CM | POA: Diagnosis not present

## 2018-12-26 NOTE — Progress Notes (Signed)
[facility] Psychosocial Distress Screening Clinical Social Work  Clinical Social Work was referred by distress screening protocol.  The patient scored a 5 on the Psychosocial Distress Thermometer which indicates moderate distress. Clinical Social Worker contacted patient by phone to assess for distress and other psychosocial needs. Patient reports he is sleeping better as coughing has decreased, is feeling somewhat better overall.  Has sons who help w transportation, but "they work", worried about how he can get to appointments if they are unavailable.  Referred to [facility].    ONCBCN DISTRESS SCREENING 12/19/2018  Screening Type Initial Screening  Distress experienced in past week (1-10) 5  Emotional problem type Adjusting to illness  Physical Problem type Sleep/insomnia;Breathing;Changes in urination    Clinical Social Worker follow up needed: No.  If yes, follow up plan:  Beverely Pace, [place], LCSW Clinical Social Worker Phone:  [phone]

## 2018-12-26 NOTE — Telephone Encounter (Signed)
Spoke with patient regarding transportation program. He is meeting with his family to discuss their schedules and is going to call me back on Monday.

## 2018-12-27 ENCOUNTER — Other Ambulatory Visit: Payer: Medicare HMO

## 2018-12-27 ENCOUNTER — Ambulatory Visit
Admission: RE | Admit: 2018-12-27 | Discharge: 2018-12-27 | Disposition: A | Discharge status: Home / Self Care | Payer: Medicare HMO | Source: Ambulatory Visit | Attending: Radiation Oncology | Admitting: Radiation Oncology

## 2018-12-27 ENCOUNTER — Telehealth: Payer: Self-pay | Admitting: Pulmonary Disease

## 2018-12-27 DIAGNOSIS — C3412 Malignant neoplasm of upper lobe, left bronchus or lung: Secondary | ICD-10-CM

## 2018-12-27 DIAGNOSIS — Z510 Encounter for antineoplastic radiation therapy: Secondary | ICD-10-CM | POA: Diagnosis not present

## 2018-12-27 DIAGNOSIS — J9611 Chronic respiratory failure with hypoxia: Secondary | ICD-10-CM

## 2018-12-27 MED ORDER — SONAFINE EX EMUL
1.0000 "application " | Freq: Once | CUTANEOUS | Status: AC
  Administered 2018-12-27: 1 via TOPICAL

## 2018-12-27 NOTE — Telephone Encounter (Signed)
im okay with signing the DME order.   Please emphasized with the family member as well as the patient that if oxygen levels are unable to be maintained greater than 88% then they will need to present for further evaluation.  Wyn Quaker FNP

## 2018-12-27 NOTE — Progress Notes (Signed)
Pt here for patient teaching.  Pt given Radiation and You booklet and Sonafine.  Reviewed areas of pertinence such as fatigue, hair loss, skin changes and throat changes . Pt able to give teach back of to pat skin,apply Sonafine bid and avoid applying anything to skin within 4 hours of treatment. Pt verbalizes understanding of information given and will contact nursing with any questions or concerns.    Gloriajean Dell. Leonie Green, BSN

## 2018-12-27 NOTE — Telephone Encounter (Signed)
Call made to Deanna, she states while at another MD appt the patients oxygen dropped in 80's. She states he does a lot of mouth breathing and wanted to order him full face oxygen mask. She states they constantly try to remind him to breath through his nose. She states once he sat up and and they go him to breath through his nose he rebounded in a few seconds. She asked the MD if they felt he needed to be seen at the cancer center and she states she was told he did not he just need to breath through his nose.   BM would you be willing to sign order for a simple full face oxygen mask for patient, as BI is out of the office. Thanks.

## 2018-12-27 NOTE — Telephone Encounter (Signed)
Call made to patient daughter Tilda Burrow, made aware order has been sent it. I explained to Deanna to be sure to keep a close eye on his oxygen saturations and if he is sustaining in the 80's she needs to call the office as soon as possible. Voiced understanding. Nothing further is needed at this time.

## 2018-12-27 NOTE — Telephone Encounter (Signed)
LVM to advise request will be sent to [facility] and to call back for additional information to be provided regarding his O2 level to be maintained. Order for DME placed.

## 2018-12-28 DIAGNOSIS — Z79891 Long term (current) use of opiate analgesic: Secondary | ICD-10-CM | POA: Diagnosis not present

## 2018-12-28 DIAGNOSIS — Z7982 Long term (current) use of aspirin: Secondary | ICD-10-CM | POA: Diagnosis not present

## 2018-12-28 DIAGNOSIS — J440 Chronic obstructive pulmonary disease with acute lower respiratory infection: Secondary | ICD-10-CM | POA: Diagnosis not present

## 2018-12-28 DIAGNOSIS — J189 Pneumonia, unspecified organism: Secondary | ICD-10-CM | POA: Diagnosis not present

## 2018-12-28 DIAGNOSIS — Z87891 Personal history of nicotine dependence: Secondary | ICD-10-CM | POA: Diagnosis not present

## 2018-12-28 DIAGNOSIS — Z792 Long term (current) use of antibiotics: Secondary | ICD-10-CM | POA: Diagnosis not present

## 2018-12-28 DIAGNOSIS — R911 Solitary pulmonary nodule: Secondary | ICD-10-CM | POA: Diagnosis not present

## 2018-12-28 DIAGNOSIS — K219 Gastro-esophageal reflux disease without esophagitis: Secondary | ICD-10-CM | POA: Diagnosis not present

## 2018-12-28 DIAGNOSIS — I10 Essential (primary) hypertension: Secondary | ICD-10-CM | POA: Diagnosis not present

## 2018-12-29 DIAGNOSIS — J439 Emphysema, unspecified: Secondary | ICD-10-CM | POA: Diagnosis not present

## 2018-12-30 ENCOUNTER — Inpatient Hospital Stay: Payer: Medicare HMO

## 2018-12-30 ENCOUNTER — Ambulatory Visit
Admission: RE | Admit: 2018-12-30 | Discharge: 2018-12-30 | Disposition: A | Discharge status: Home / Self Care | Payer: Medicare HMO | Source: Ambulatory Visit | Attending: Radiation Oncology | Admitting: Radiation Oncology

## 2018-12-30 ENCOUNTER — Inpatient Hospital Stay: Payer: Medicare HMO | Attending: Internal Medicine

## 2018-12-30 DIAGNOSIS — J449 Chronic obstructive pulmonary disease, unspecified: Secondary | ICD-10-CM | POA: Diagnosis not present

## 2018-12-30 DIAGNOSIS — K219 Gastro-esophageal reflux disease without esophagitis: Secondary | ICD-10-CM | POA: Diagnosis not present

## 2018-12-30 DIAGNOSIS — K0889 Other specified disorders of teeth and supporting structures: Secondary | ICD-10-CM | POA: Diagnosis not present

## 2018-12-30 DIAGNOSIS — Z79899 Other long term (current) drug therapy: Secondary | ICD-10-CM | POA: Insufficient documentation

## 2018-12-30 DIAGNOSIS — Z7982 Long term (current) use of aspirin: Secondary | ICD-10-CM | POA: Insufficient documentation

## 2018-12-30 DIAGNOSIS — C3492 Malignant neoplasm of unspecified part of left bronchus or lung: Secondary | ICD-10-CM

## 2018-12-30 DIAGNOSIS — J029 Acute pharyngitis, unspecified: Secondary | ICD-10-CM | POA: Insufficient documentation

## 2018-12-30 DIAGNOSIS — C3412 Malignant neoplasm of upper lobe, left bronchus or lung: Secondary | ICD-10-CM | POA: Insufficient documentation

## 2018-12-30 DIAGNOSIS — I10 Essential (primary) hypertension: Secondary | ICD-10-CM | POA: Diagnosis not present

## 2018-12-30 DIAGNOSIS — K5900 Constipation, unspecified: Secondary | ICD-10-CM | POA: Diagnosis not present

## 2018-12-30 DIAGNOSIS — Z5111 Encounter for antineoplastic chemotherapy: Secondary | ICD-10-CM | POA: Insufficient documentation

## 2018-12-30 DIAGNOSIS — Z87891 Personal history of nicotine dependence: Secondary | ICD-10-CM | POA: Insufficient documentation

## 2018-12-30 DIAGNOSIS — I959 Hypotension, unspecified: Secondary | ICD-10-CM | POA: Insufficient documentation

## 2018-12-30 DIAGNOSIS — Z510 Encounter for antineoplastic radiation therapy: Secondary | ICD-10-CM | POA: Insufficient documentation

## 2018-12-30 LAB — CMP (CANCER CENTER ONLY)
ALT: 15 U/L (ref 0–44)
AST: 14 U/L — ABNORMAL LOW (ref 15–41)
Albumin: 3.3 g/dL — ABNORMAL LOW (ref 3.5–5.0)
Alkaline Phosphatase: 91 U/L (ref 38–126)
Anion gap: 8 (ref 5–15)
BUN: 16 mg/dL (ref 8–23)
CO2: 24 mmol/L (ref 22–32)
Calcium: 8.7 mg/dL — ABNORMAL LOW (ref 8.9–10.3)
Chloride: 106 mmol/L (ref 98–111)
Creatinine: 0.91 mg/dL (ref 0.61–1.24)
GFR, Est AFR Am: >60 mL/min (ref >60)
GFR, Estimated: >60 mL/min (ref >60)
Glucose, Bld: 95 mg/dL (ref 70–99)
Potassium: 4.9 mmol/L (ref 3.5–5.1)
Sodium: 138 mmol/L (ref 135–145)
Total Bilirubin: 0.4 mg/dL (ref 0.3–1.2)
Total Protein: 6 g/dL — ABNORMAL LOW (ref 6.5–8.1)

## 2018-12-30 LAB — CBC WITH DIFFERENTIAL (CANCER CENTER ONLY)
Abs Immature Granulocytes: 0.06 10*3/uL (ref 0.00–0.07)
Basophils Absolute: 0 10*3/uL (ref 0.0–0.1)
Basophils Relative: 1 %
Eosinophils Absolute: 0.1 10*3/uL (ref 0.0–0.5)
Eosinophils Relative: 1 %
HCT: 29.7 % — ABNORMAL LOW (ref 39.0–52.0)
Hemoglobin: 9.5 g/dL — ABNORMAL LOW (ref 13.0–17.0)
Immature Granulocytes: 1 %
Lymphocytes Relative: 14 %
Lymphs Abs: 0.9 10*3/uL (ref 0.7–4.0)
MCH: 35.1 pg — ABNORMAL HIGH (ref 26.0–34.0)
MCHC: 32 g/dL (ref 30.0–36.0)
MCV: 109.6 fL — ABNORMAL HIGH (ref 80.0–100.0)
Monocytes Absolute: 0.7 10*3/uL (ref 0.1–1.0)
Monocytes Relative: 11 %
Neutro Abs: 4.5 10*3/uL (ref 1.7–7.7)
Neutrophils Relative %: 72 %
Platelet Count: 293 10*3/uL (ref 150–400)
RBC: 2.71 MIL/uL — ABNORMAL LOW (ref 4.22–5.81)
RDW: 16 % — ABNORMAL HIGH (ref 11.5–15.5)
WBC Count: 6.2 10*3/uL (ref 4.0–10.5)
nRBC: 0 % (ref 0.0–0.2)

## 2018-12-31 ENCOUNTER — Inpatient Hospital Stay: Payer: Medicare HMO

## 2018-12-31 ENCOUNTER — Ambulatory Visit
Admission: RE | Admit: 2018-12-31 | Discharge: 2018-12-31 | Disposition: A | Discharge status: Home / Self Care | Payer: Medicare HMO | Source: Ambulatory Visit | Attending: Radiation Oncology | Admitting: Radiation Oncology

## 2018-12-31 VITALS — BP 117/55 | HR 89 | Temp 98.7°F | Resp 17

## 2018-12-31 DIAGNOSIS — Z510 Encounter for antineoplastic radiation therapy: Secondary | ICD-10-CM | POA: Diagnosis not present

## 2018-12-31 DIAGNOSIS — K219 Gastro-esophageal reflux disease without esophagitis: Secondary | ICD-10-CM | POA: Diagnosis not present

## 2018-12-31 DIAGNOSIS — I959 Hypotension, unspecified: Secondary | ICD-10-CM | POA: Diagnosis not present

## 2018-12-31 DIAGNOSIS — J449 Chronic obstructive pulmonary disease, unspecified: Secondary | ICD-10-CM | POA: Diagnosis not present

## 2018-12-31 DIAGNOSIS — C3492 Malignant neoplasm of unspecified part of left bronchus or lung: Secondary | ICD-10-CM

## 2018-12-31 DIAGNOSIS — I10 Essential (primary) hypertension: Secondary | ICD-10-CM | POA: Diagnosis not present

## 2018-12-31 DIAGNOSIS — Z5111 Encounter for antineoplastic chemotherapy: Secondary | ICD-10-CM | POA: Diagnosis not present

## 2018-12-31 DIAGNOSIS — K5900 Constipation, unspecified: Secondary | ICD-10-CM | POA: Diagnosis not present

## 2018-12-31 DIAGNOSIS — J029 Acute pharyngitis, unspecified: Secondary | ICD-10-CM | POA: Diagnosis not present

## 2018-12-31 DIAGNOSIS — K0889 Other specified disorders of teeth and supporting structures: Secondary | ICD-10-CM | POA: Diagnosis not present

## 2018-12-31 DIAGNOSIS — C3412 Malignant neoplasm of upper lobe, left bronchus or lung: Secondary | ICD-10-CM | POA: Diagnosis not present

## 2018-12-31 MED ORDER — SODIUM CHLORIDE 0.9 % IV SOLN
Freq: Once | INTRAVENOUS | Status: AC
  Administered 2018-12-31: 09:00:00 via INTRAVENOUS
  Filled 2018-12-31: qty 250

## 2018-12-31 MED ORDER — PALONOSETRON HCL INJECTION 0.25 MG/5ML
INTRAVENOUS | Status: AC
  Filled 2018-12-31: qty 5

## 2018-12-31 MED ORDER — PALONOSETRON HCL INJECTION 0.25 MG/5ML
0.2500 mg | Freq: Once | INTRAVENOUS | Status: AC
  Administered 2018-12-31: 0.25 mg via INTRAVENOUS

## 2018-12-31 MED ORDER — FAMOTIDINE IN NACL 20-0.9 MG/50ML-% IV SOLN
20.0000 mg | Freq: Once | INTRAVENOUS | Status: AC
  Administered 2018-12-31: 20 mg via INTRAVENOUS

## 2018-12-31 MED ORDER — SODIUM CHLORIDE 0.9 % IV SOLN
190.2000 mg | Freq: Once | INTRAVENOUS | Status: AC
  Administered 2018-12-31: 190 mg via INTRAVENOUS
  Filled 2018-12-31: qty 19

## 2018-12-31 MED ORDER — DIPHENHYDRAMINE HCL 50 MG/ML IJ SOLN
INTRAMUSCULAR | Status: AC
  Filled 2018-12-31: qty 1

## 2018-12-31 MED ORDER — SODIUM CHLORIDE 0.9 % IV SOLN
45.0000 mg/m2 | Freq: Once | INTRAVENOUS | Status: AC
  Administered 2018-12-31: 90 mg via INTRAVENOUS
  Filled 2018-12-31: qty 15

## 2018-12-31 MED ORDER — SODIUM CHLORIDE 0.9 % IV SOLN
20.0000 mg | Freq: Once | INTRAVENOUS | Status: AC
  Administered 2018-12-31: 20 mg via INTRAVENOUS
  Filled 2018-12-31: qty 2

## 2018-12-31 MED ORDER — DIPHENHYDRAMINE HCL 50 MG/ML IJ SOLN
50.0000 mg | Freq: Once | INTRAMUSCULAR | Status: AC
  Administered 2018-12-31: 50 mg via INTRAVENOUS

## 2018-12-31 MED ORDER — FAMOTIDINE IN NACL 20-0.9 MG/50ML-% IV SOLN
INTRAVENOUS | Status: AC
  Filled 2018-12-31: qty 50

## 2018-12-31 NOTE — Patient Instructions (Signed)
[facility] Discharge Instructions for Patients Receiving Chemotherapy  Today you received the following chemotherapy agents Paclitaxel (Taxol) and Carboplatin  To help prevent nausea and vomiting after your treatment, we encourage you to take your nausea medication as prescribed.   If you develop nausea and vomiting that is not controlled by your nausea medication, call the clinic.   BELOW ARE SYMPTOMS THAT SHOULD BE REPORTED IMMEDIATELY:  *FEVER GREATER THAN 100.5 F  *CHILLS WITH OR WITHOUT FEVER  NAUSEA AND VOMITING THAT IS NOT CONTROLLED WITH YOUR NAUSEA MEDICATION  *UNUSUAL SHORTNESS OF BREATH  *UNUSUAL BRUISING OR BLEEDING  TENDERNESS IN MOUTH AND THROAT WITH OR WITHOUT PRESENCE OF ULCERS  *URINARY PROBLEMS  *BOWEL PROBLEMS  UNUSUAL RASH Items with * indicate a potential emergency and should be followed up as soon as possible.  Feel free to call the clinic should you have any questions or concerns. The clinic phone number is [phone].  Please show the [facility] at check-in to the Emergency Department and triage nurse.  Paclitaxel injection (Taxol) What is this medicine? PACLITAXEL (PAK li TAX el) is a chemotherapy drug. It targets fast dividing cells, like cancer cells, and causes these cells to die. This medicine is used to treat ovarian cancer, breast cancer, lung cancer, Kaposi's sarcoma, and other cancers. This medicine may be used for other purposes; ask your health care provider or pharmacist if you have questions. COMMON BRAND NAME(S): Onxol, Taxol What should I tell my health care provider before I take this medicine? They need to know if you have any of these conditions: -history of irregular heartbeat -liver disease -low blood counts, like low white cell, platelet, or red cell counts -lung or breathing disease, like asthma -tingling of the fingers or toes, or other nerve disorder -an unusual or allergic reaction to paclitaxel,  alcohol, polyoxyethylated castor oil, other chemotherapy, other medicines, foods, dyes, or preservatives -pregnant or trying to get pregnant -breast-feeding How should I use this medicine? This drug is given as an infusion into a vein. It is administered in a hospital or clinic by a specially trained health care professional. Talk to your pediatrician regarding the use of this medicine in children. Special care may be needed. Overdosage: If you think you have taken too much of this medicine contact a poison control center or emergency room at once. NOTE: This medicine is only for you. Do not share this medicine with others. What if I miss a dose? It is important not to miss your dose. Call your doctor or health care professional if you are unable to keep an appointment. What may interact with this medicine? Do not take this medicine with any of the following medications: -disulfiram -metronidazole This medicine may also interact with the following medications: -antiviral medicines for hepatitis, HIV or AIDS -certain antibiotics like erythromycin and clarithromycin -certain medicines for fungal infections like ketoconazole and itraconazole -certain medicines for seizures like carbamazepine, phenobarbital, phenytoin -gemfibrozil -nefazodone -rifampin -St. John's wort This list may not describe all possible interactions. Give your health care provider a list of all the medicines, herbs, non-prescription drugs, or dietary supplements you use. Also tell them if you smoke, drink alcohol, or use illegal drugs. Some items may interact with your medicine. What should I watch for while using this medicine? Your condition will be monitored carefully while you are receiving this medicine. You will need important blood work done while you are taking this medicine. This medicine can cause serious allergic reactions.  To reduce your risk you will need to take other medicine(s) before treatment with this  medicine. If you experience allergic reactions like skin rash, itching or hives, swelling of the face, lips, or tongue, tell your doctor or health care professional right away. In some cases, you may be given additional medicines to help with side effects. Follow all directions for their use. This drug may make you feel generally unwell. This is not uncommon, as chemotherapy can affect healthy cells as well as cancer cells. Report any side effects. Continue your course of treatment even though you feel ill unless your doctor tells you to stop. Call your doctor or health care professional for advice if you get a fever, chills or sore throat, or other symptoms of a cold or flu. Do not treat yourself. This drug decreases your body's ability to fight infections. Try to avoid being around people who are sick. This medicine may increase your risk to bruise or bleed. Call your doctor or health care professional if you notice any unusual bleeding. Be careful brushing and flossing your teeth or using a toothpick because you may get an infection or bleed more easily. If you have any dental work done, tell your dentist you are receiving this medicine. Avoid taking products that contain aspirin, acetaminophen, ibuprofen, naproxen, or ketoprofen unless instructed by your doctor. These medicines may hide a fever. Do not become pregnant while taking this medicine. Women should inform their doctor if they wish to become pregnant or think they might be pregnant. There is a potential for serious side effects to an unborn child. Talk to your health care professional or pharmacist for more information. Do not breast-feed an infant while taking this medicine. Men are advised not to father a child while receiving this medicine. This product may contain alcohol. Ask your pharmacist or healthcare provider if this medicine contains alcohol. Be sure to tell all healthcare providers you are taking this medicine. Certain medicines,  like metronidazole and disulfiram, can cause an unpleasant reaction when taken with alcohol. The reaction includes flushing, headache, nausea, vomiting, sweating, and increased thirst. The reaction can last from 30 minutes to several hours. What side effects may I notice from receiving this medicine? Side effects that you should report to your doctor or health care professional as soon as possible: -allergic reactions like skin rash, itching or hives, swelling of the face, lips, or tongue -breathing problems -changes in vision -fast, irregular heartbeat -high or low blood pressure -mouth sores -pain, tingling, numbness in the hands or feet -signs of decreased platelets or bleeding - bruising, pinpoint red spots on the skin, black, tarry stools, blood in the urine -signs of decreased red blood cells - unusually weak or tired, feeling faint or lightheaded, falls -signs of infection - fever or chills, cough, sore throat, pain or difficulty passing urine -signs and symptoms of liver injury like dark yellow or brown urine; general ill feeling or flu-like symptoms; light-colored stools; loss of appetite; nausea; right upper belly pain; unusually weak or tired; yellowing of the eyes or skin -swelling of the ankles, feet, hands -unusually slow heartbeat Side effects that usually do not require medical attention (report to your doctor or health care professional if they continue or are bothersome): -diarrhea -hair loss -loss of appetite -muscle or joint pain -nausea, vomiting -pain, redness, or irritation at site where injected -tiredness This list may not describe all possible side effects. Call your doctor for medical advice about side effects. You may report  side effects to FDA at 1-800-FDA-1088. Where should I keep my medicine? This drug is given in a hospital or clinic and will not be stored at home. NOTE: This sheet is a summary. It may not cover all possible information. If you have  questions about this medicine, talk to your doctor, pharmacist, or health care provider.  2019 [employer]/Gold Standard (2017-07-17 13:14:55)  Carboplatin injection What is this medicine? CARBOPLATIN (KAR boe pla tin) is a chemotherapy drug. It targets fast dividing cells, like cancer cells, and causes these cells to die. This medicine is used to treat ovarian cancer and many other cancers. This medicine may be used for other purposes; ask your health care provider or pharmacist if you have questions. COMMON BRAND NAME(S): Paraplatin What should I tell my health care provider before I take this medicine? They need to know if you have any of these conditions: -blood disorders -hearing problems -kidney disease -recent or ongoing radiation therapy -an unusual or allergic reaction to carboplatin, cisplatin, other chemotherapy, other medicines, foods, dyes, or preservatives -pregnant or trying to get pregnant -breast-feeding How should I use this medicine? This drug is usually given as an infusion into a vein. It is administered in a hospital or clinic by a specially trained health care professional. Talk to your pediatrician regarding the use of this medicine in children. Special care may be needed. Overdosage: If you think you have taken too much of this medicine contact a poison control center or emergency room at once. NOTE: This medicine is only for you. Do not share this medicine with others. What if I miss a dose? It is important not to miss a dose. Call your doctor or health care professional if you are unable to keep an appointment. What may interact with this medicine? -medicines for seizures -medicines to increase blood counts like filgrastim, pegfilgrastim, sargramostim -some antibiotics like amikacin, gentamicin, neomycin, streptomycin, tobramycin -vaccines Talk to your doctor or health care professional before taking any of these  medicines: -acetaminophen -aspirin -ibuprofen -ketoprofen -naproxen This list may not describe all possible interactions. Give your health care provider a list of all the medicines, herbs, non-prescription drugs, or dietary supplements you use. Also tell them if you smoke, drink alcohol, or use illegal drugs. Some items may interact with your medicine. What should I watch for while using this medicine? Your condition will be monitored carefully while you are receiving this medicine. You will need important blood work done while you are taking this medicine. This drug may make you feel generally unwell. This is not uncommon, as chemotherapy can affect healthy cells as well as cancer cells. Report any side effects. Continue your course of treatment even though you feel ill unless your doctor tells you to stop. In some cases, you may be given additional medicines to help with side effects. Follow all directions for their use. Call your doctor or health care professional for advice if you get a fever, chills or sore throat, or other symptoms of a cold or flu. Do not treat yourself. This drug decreases your body's ability to fight infections. Try to avoid being around people who are sick. This medicine may increase your risk to bruise or bleed. Call your doctor or health care professional if you notice any unusual bleeding. Be careful brushing and flossing your teeth or using a toothpick because you may get an infection or bleed more easily. If you have any dental work done, tell your dentist you are receiving this medicine. Avoid  taking products that contain aspirin, acetaminophen, ibuprofen, naproxen, or ketoprofen unless instructed by your doctor. These medicines may hide a fever. Do not become pregnant while taking this medicine. Women should inform their doctor if they wish to become pregnant or think they might be pregnant. There is a potential for serious side effects to an unborn child. Talk to  your health care professional or pharmacist for more information. Do not breast-feed an infant while taking this medicine. What side effects may I notice from receiving this medicine? Side effects that you should report to your doctor or health care professional as soon as possible: -allergic reactions like skin rash, itching or hives, swelling of the face, lips, or tongue -signs of infection - fever or chills, cough, sore throat, pain or difficulty passing urine -signs of decreased platelets or bleeding - bruising, pinpoint red spots on the skin, black, tarry stools, nosebleeds -signs of decreased red blood cells - unusually weak or tired, fainting spells, lightheadedness -breathing problems -changes in hearing -changes in vision -chest pain -high blood pressure -low blood counts - This drug may decrease the number of white blood cells, red blood cells and platelets. You may be at increased risk for infections and bleeding. -nausea and vomiting -pain, swelling, redness or irritation at the injection site -pain, tingling, numbness in the hands or feet -problems with balance, talking, walking -trouble passing urine or change in the amount of urine Side effects that usually do not require medical attention (report to your doctor or health care professional if they continue or are bothersome): -hair loss -loss of appetite -metallic taste in the mouth or changes in taste This list may not describe all possible side effects. Call your doctor for medical advice about side effects. You may report side effects to FDA at 1-800-FDA-1088. Where should I keep my medicine? This drug is given in a hospital or clinic and will not be stored at home. NOTE: This sheet is a summary. It may not cover all possible information. If you have questions about this medicine, talk to your doctor, pharmacist, or health care provider.  2019 [employer]/Gold Standard (2008-02-18 14:38:05)

## 2019-01-01 ENCOUNTER — Telehealth: Payer: Self-pay | Admitting: *Deleted

## 2019-01-01 ENCOUNTER — Ambulatory Visit
Admission: RE | Admit: 2019-01-01 | Discharge: 2019-01-01 | Disposition: A | Discharge status: Home / Self Care | Payer: Medicare HMO | Source: Ambulatory Visit | Attending: Radiation Oncology | Admitting: Radiation Oncology

## 2019-01-01 DIAGNOSIS — R911 Solitary pulmonary nodule: Secondary | ICD-10-CM | POA: Diagnosis not present

## 2019-01-01 DIAGNOSIS — Z510 Encounter for antineoplastic radiation therapy: Secondary | ICD-10-CM | POA: Diagnosis not present

## 2019-01-01 DIAGNOSIS — J440 Chronic obstructive pulmonary disease with acute lower respiratory infection: Secondary | ICD-10-CM | POA: Diagnosis not present

## 2019-01-01 DIAGNOSIS — K219 Gastro-esophageal reflux disease without esophagitis: Secondary | ICD-10-CM | POA: Diagnosis not present

## 2019-01-01 DIAGNOSIS — C3412 Malignant neoplasm of upper lobe, left bronchus or lung: Secondary | ICD-10-CM | POA: Diagnosis not present

## 2019-01-01 DIAGNOSIS — I10 Essential (primary) hypertension: Secondary | ICD-10-CM | POA: Diagnosis not present

## 2019-01-01 DIAGNOSIS — Z79891 Long term (current) use of opiate analgesic: Secondary | ICD-10-CM | POA: Diagnosis not present

## 2019-01-01 DIAGNOSIS — J189 Pneumonia, unspecified organism: Secondary | ICD-10-CM | POA: Diagnosis not present

## 2019-01-01 DIAGNOSIS — Z87891 Personal history of nicotine dependence: Secondary | ICD-10-CM | POA: Diagnosis not present

## 2019-01-01 DIAGNOSIS — Z7982 Long term (current) use of aspirin: Secondary | ICD-10-CM | POA: Diagnosis not present

## 2019-01-01 DIAGNOSIS — Z792 Long term (current) use of antibiotics: Secondary | ICD-10-CM | POA: Diagnosis not present

## 2019-01-01 NOTE — Telephone Encounter (Signed)
-----   Message from Ishmael Holter, RN sent at 12/31/2018 10:30 AM EST ----- Regarding: Dr. Julien Nordmann 1st tx f/u call Dr. Julien Nordmann 1st tx f/u call

## 2019-01-01 NOTE — Telephone Encounter (Signed)
[place] for chemotherapy F/U.  Patient is doing well.  Denies n/v.  "Felt dizzy this morning as Dr. Julien Nordmann said I would with the medication he ordered."  Denies further side effects or symptoms.  Bowel and bladder functioning well.  "Bowels move daily sometimes with difficulty.  Take two FiberCon daily.  Eating and drinking well just not a lot because I feel full quickly.  Instructed to drink 64 oz minimum daily or at least the day before, of and after treatment.   "I normally doen't sleep well with cough.  OTC cough medicine last night relieved cough helped me rest".  Denies questions or needs at this time.  Encouraged to call [phone] Mon -Fri 8:00 am - 4:30 pm or anytime as needed for symptoms, changes for on-call nurse assistance outside office hours.

## 2019-01-02 ENCOUNTER — Ambulatory Visit
Admission: RE | Admit: 2019-01-02 | Discharge: 2019-01-02 | Disposition: A | Discharge status: Home / Self Care | Payer: Medicare HMO | Source: Ambulatory Visit | Attending: Radiation Oncology | Admitting: Radiation Oncology

## 2019-01-02 ENCOUNTER — Ambulatory Visit: Payer: Medicare HMO | Admitting: Pulmonary Disease

## 2019-01-02 DIAGNOSIS — Z510 Encounter for antineoplastic radiation therapy: Secondary | ICD-10-CM | POA: Diagnosis not present

## 2019-01-02 DIAGNOSIS — C3412 Malignant neoplasm of upper lobe, left bronchus or lung: Secondary | ICD-10-CM | POA: Diagnosis not present

## 2019-01-03 ENCOUNTER — Ambulatory Visit
Admission: RE | Admit: 2019-01-03 | Discharge: 2019-01-03 | Disposition: A | Discharge status: Home / Self Care | Payer: Medicare HMO | Source: Ambulatory Visit | Attending: Radiation Oncology | Admitting: Radiation Oncology

## 2019-01-03 DIAGNOSIS — C3412 Malignant neoplasm of upper lobe, left bronchus or lung: Secondary | ICD-10-CM | POA: Diagnosis not present

## 2019-01-03 DIAGNOSIS — Z510 Encounter for antineoplastic radiation therapy: Secondary | ICD-10-CM | POA: Diagnosis not present

## 2019-01-06 ENCOUNTER — Ambulatory Visit
Admission: RE | Admit: 2019-01-06 | Discharge: 2019-01-06 | Disposition: A | Discharge status: Home / Self Care | Payer: Medicare HMO | Source: Ambulatory Visit | Attending: Radiation Oncology | Admitting: Radiation Oncology

## 2019-01-06 ENCOUNTER — Encounter: Payer: Self-pay | Admitting: Physician Assistant

## 2019-01-06 ENCOUNTER — Inpatient Hospital Stay: Payer: Medicare HMO

## 2019-01-06 ENCOUNTER — Inpatient Hospital Stay (HOSPITAL_BASED_OUTPATIENT_CLINIC_OR_DEPARTMENT_OTHER): Payer: Medicare HMO | Admitting: Physician Assistant

## 2019-01-06 VITALS — BP 100/50 | HR 84 | Temp 98.2°F | Resp 18 | Ht 68.0 in | Wt 175.4 lb

## 2019-01-06 DIAGNOSIS — I10 Essential (primary) hypertension: Secondary | ICD-10-CM

## 2019-01-06 DIAGNOSIS — Z79899 Other long term (current) drug therapy: Secondary | ICD-10-CM | POA: Diagnosis not present

## 2019-01-06 DIAGNOSIS — I959 Hypotension, unspecified: Secondary | ICD-10-CM | POA: Diagnosis not present

## 2019-01-06 DIAGNOSIS — K0889 Other specified disorders of teeth and supporting structures: Secondary | ICD-10-CM | POA: Diagnosis not present

## 2019-01-06 DIAGNOSIS — C3492 Malignant neoplasm of unspecified part of left bronchus or lung: Secondary | ICD-10-CM

## 2019-01-06 DIAGNOSIS — J449 Chronic obstructive pulmonary disease, unspecified: Secondary | ICD-10-CM | POA: Diagnosis not present

## 2019-01-06 DIAGNOSIS — K5900 Constipation, unspecified: Secondary | ICD-10-CM | POA: Diagnosis not present

## 2019-01-06 DIAGNOSIS — K219 Gastro-esophageal reflux disease without esophagitis: Secondary | ICD-10-CM

## 2019-01-06 DIAGNOSIS — Z7982 Long term (current) use of aspirin: Secondary | ICD-10-CM | POA: Diagnosis not present

## 2019-01-06 DIAGNOSIS — C3412 Malignant neoplasm of upper lobe, left bronchus or lung: Secondary | ICD-10-CM | POA: Diagnosis not present

## 2019-01-06 DIAGNOSIS — C3432 Malignant neoplasm of lower lobe, left bronchus or lung: Secondary | ICD-10-CM

## 2019-01-06 DIAGNOSIS — J029 Acute pharyngitis, unspecified: Secondary | ICD-10-CM | POA: Diagnosis not present

## 2019-01-06 DIAGNOSIS — Z5111 Encounter for antineoplastic chemotherapy: Secondary | ICD-10-CM | POA: Diagnosis not present

## 2019-01-06 DIAGNOSIS — Z510 Encounter for antineoplastic radiation therapy: Secondary | ICD-10-CM | POA: Diagnosis not present

## 2019-01-06 LAB — CMP (CANCER CENTER ONLY)
ALT: 16 U/L (ref 0–44)
AST: 13 U/L — ABNORMAL LOW (ref 15–41)
Albumin: 3.6 g/dL (ref 3.5–5.0)
Alkaline Phosphatase: 72 U/L (ref 38–126)
Anion gap: 5 (ref 5–15)
BUN: 16 mg/dL (ref 8–23)
CO2: 25 mmol/L (ref 22–32)
Calcium: 8.8 mg/dL — ABNORMAL LOW (ref 8.9–10.3)
Chloride: 109 mmol/L (ref 98–111)
Creatinine: 0.89 mg/dL (ref 0.61–1.24)
GFR, Est AFR Am: >60 mL/min (ref >60)
GFR, Estimated: >60 mL/min (ref >60)
Glucose, Bld: 101 mg/dL — ABNORMAL HIGH (ref 70–99)
Potassium: 5.4 mmol/L — ABNORMAL HIGH (ref 3.5–5.1)
Sodium: 139 mmol/L (ref 135–145)
Total Bilirubin: 0.7 mg/dL (ref 0.3–1.2)
Total Protein: 6.1 g/dL — ABNORMAL LOW (ref 6.5–8.1)

## 2019-01-06 LAB — CBC WITH DIFFERENTIAL (CANCER CENTER ONLY)
Abs Immature Granulocytes: 0.02 10*3/uL (ref 0.00–0.07)
Basophils Absolute: 0.1 10*3/uL (ref 0.0–0.1)
Basophils Relative: 1 %
Eosinophils Absolute: 0.1 10*3/uL (ref 0.0–0.5)
Eosinophils Relative: 2 %
HCT: 31 % — ABNORMAL LOW (ref 39.0–52.0)
Hemoglobin: 9.8 g/dL — ABNORMAL LOW (ref 13.0–17.0)
Immature Granulocytes: 0 %
Lymphocytes Relative: 15 %
Lymphs Abs: 0.7 10*3/uL (ref 0.7–4.0)
MCH: 34.8 pg — ABNORMAL HIGH (ref 26.0–34.0)
MCHC: 31.6 g/dL (ref 30.0–36.0)
MCV: 109.9 fL — ABNORMAL HIGH (ref 80.0–100.0)
Monocytes Absolute: 0.3 10*3/uL (ref 0.1–1.0)
Monocytes Relative: 6 %
Neutro Abs: 3.6 10*3/uL (ref 1.7–7.7)
Neutrophils Relative %: 76 %
Platelet Count: 256 10*3/uL (ref 150–400)
RBC: 2.82 MIL/uL — ABNORMAL LOW (ref 4.22–5.81)
RDW: 15.5 % (ref 11.5–15.5)
WBC Count: 4.7 10*3/uL (ref 4.0–10.5)
nRBC: 0 % (ref 0.0–0.2)

## 2019-01-06 NOTE — Progress Notes (Signed)
[place] OFFICE PROGRESS NOTE  [name], MD [place] [address] [place] [address]  DIAGNOSIS: stage IIIb (T1b, N3, M0) non-small cell lung cancer favoring adenocarcinoma diagnosed in January 2020 and presented with left lower lobe pulmonary nodule in addition to bilateral hilar and subcarinal lymphadenopathy.  Molecular studies by [employer] showed no actionable mutations.  PRIOR THERAPY: None  CURRENT THERAPY: Concurrent chemoradiation with weekly carboplatin for AUC of 2 and paclitaxel 45 mg/M2. First dose February 4th, 2020. Status post 1 cycle.   INTERVAL HISTORY: Robert Dixon 78 y.o. male returns to the clinic today for a follow-up visit. The patient tolerated his first cycle of treatment fairly well. The patient is feeling well today with no specific complaints except for "teeth soreness" and hypotension. He localizes his oral discomfort to the upper and lower gingival surface. The patient is still able to eat and drink without complaint. The patient denies any associated fevers, erythema, gingival bleeding, or ulcerations. He denies chills, night sweats, weight loss. He reports baseline shortness of breath and denies chest pain, hemoptysis, or wheezing. He denies headache or visual changes. He denies nausea, vomiting, or diarrhea. He reports mild constipation which he takes OTC stool softener for which works well for him.  Regarding the hypotension, the patient states for the last week or so his blood pressure has been running in the low 100's-110's which he states is abnormal for him. He reports his systolic blood pressure typically is in the 130's. He is asymptomatic except for some mild lightheadedness. He is here today for evaluation before starting cycle number 2 of his treatment tomorrow.   MEDICAL HISTORY: Past Medical History:  Diagnosis Date  . Cancer ([place])    skin-   . Complication of anesthesia   . COPD (chronic obstructive pulmonary  disease) ([place])   . Dyspnea   . GERD (gastroesophageal reflux disease)   . Hemoptysis 11/20/2018  . History of hiatal hernia   . Hypertension   . Hypoxia 11/20/2018  . 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 Migraine with visual aura   . Pneumonia 11/20/2018  . PONV (postoperative nausea and vomiting)   . Pulmonary nodule 11/20/2018    ALLERGIES:  has no active allergies.  MEDICATIONS:  Current Outpatient Medications  Medication Sig Dispense Refill  . albuterol (PROVENTIL HFA;VENTOLIN HFA) 108 (90 Base) MCG/ACT inhaler Inhale 2 puffs into the lungs every 6 (six) hours as needed for wheezing or shortness of breath. 1 Inhaler 6  . amLODipine (NORVASC) 10 MG tablet Take 10 mg by mouth daily.    Marland Kitchen aspirin 81 MG tablet Take 81 mg by mouth daily.    . benazepril (LOTENSIN) 10 MG tablet Take 10 mg by mouth daily.    Marland Kitchen dimenhyDRINATE (DRAMAMINE) 50 MG tablet Take 50 mg by mouth every 8 (eight) hours as needed.    Marland Kitchen HYDROcodone-homatropine (HYCODAN) 5-1.5 MG/5ML syrup Take 5 mLs by mouth every 6 (six) hours as needed for cough.     . Multiple Vitamin (MULTIVITAMIN WITH MINERALS) TABS tablet Take 1 tablet by mouth daily.    . pantoprazole (PROTONIX) 40 MG tablet Take 40 mg by mouth 2 (two) times daily.    . polycarbophil (FIBERCON) 625 MG tablet Take 625 mg by mouth 2 (two) times daily.     . prochlorperazine (COMPAZINE) 10 MG tablet Take 1 tablet (10 mg total) by mouth every 6 (six) hours as needed for nausea or vomiting. 30 tablet 0  .  vitamin C (ASCORBIC ACID) 500 MG tablet Take 500 mg by mouth daily.     No current facility-administered medications for this visit.     SURGICAL HISTORY:  Past Surgical History:  Procedure Laterality Date  . COLONOSCOPY W/ POLYPECTOMY    . TONSILLECTOMY    . VASECTOMY    . VIDEO BRONCHOSCOPY WITH ENDOBRONCHIAL ULTRASOUND N/A 12/06/2018   Procedure: VIDEO BRONCHOSCOPY WITH ENDOBRONCHIAL ULTRASOUND;  Surgeon: Garner Nash, DO;  Location: [facility] OR;  Service:  Thoracic;  Laterality: N/A;    REVIEW OF SYSTEMS:   Review of Systems  Constitutional: Negative for appetite change, chills, fatigue, fever and unexpected weight change.  HENT:   Negative for mouth sores, nosebleeds, sore throat and trouble swallowing.   Eyes: Negative for eye problems and icterus.  Respiratory: Negative for cough, hemoptysis, shortness of breath and wheezing.   Cardiovascular: Negative for chest pain and leg swelling.  Gastrointestinal: Negative for abdominal pain, constipation, diarrhea, nausea and vomiting.  Genitourinary: Negative for bladder incontinence, difficulty urinating, dysuria, frequency and hematuria.   Musculoskeletal: Negative for back pain, gait problem, neck pain and neck stiffness.  Skin: Negative for itching and rash.  Neurological: Negative for dizziness, extremity weakness, gait problem, headaches, light-headedness and seizures.  Hematological: Negative for adenopathy. Does not bruise/bleed easily.  Psychiatric/Behavioral: Negative for confusion, depression and sleep disturbance. The patient is not nervous/anxious.     PHYSICAL EXAMINATION:  Blood pressure (!) 100/50, pulse 84, temperature 98.2 F (36.8 C), temperature source Oral, resp. rate 18, height 5\' 8"  (1.727 m), weight 175 lb 6.4 oz (79.6 kg), SpO2 96 %.  ECOG PERFORMANCE STATUS: 1 - Symptomatic but completely ambulatory  Physical Exam  Constitutional: Oriented to person, place, and time and well-developed, well-nourished, and in no distress. No distress.  HENT:  Head: Normocephalic and atraumatic.  Mouth/Throat: Oropharynx is clear and moist. No oropharyngeal exudate. No erythema, bleeding, or ulcerations.  Eyes: Conjunctivae are normal. Right eye exhibits no discharge. Left eye exhibits no discharge. No scleral icterus.  Neck: Normal range of motion. Neck supple.  Cardiovascular: Normal rate, regular rhythm, normal heart sounds and intact distal pulses.  Pitting edema in lower  extremities (baseline per patient report)  Pulmonary/Chest: Effort normal and breath sounds normal. No respiratory distress. No wheezes. No rales.  Abdominal: Soft. Bowel sounds are normal. Exhibits no distension and no mass. There is no tenderness.  Musculoskeletal: Normal range of motion. Exhibits no edema.  Lymphadenopathy:    No cervical adenopathy.  Neurological: Alert and oriented to person, place, and time. Exhibits normal muscle tone. Gait normal. Coordination normal.  Skin: Skin is warm and dry. No rash noted. Not diaphoretic. No erythema. No pallor.  Psychiatric: Mood, memory and judgment normal.  Vitals reviewed.  LABORATORY DATA: Lab Results  Component Value Date   WBC 4.7 01/06/2019   HGB 9.8 (L) 01/06/2019   HCT 31.0 (L) 01/06/2019   MCV 109.9 (H) 01/06/2019   PLT 256 01/06/2019      Chemistry      Component Value Date/Time   NA 139 01/06/2019 0933   K 5.4 (H) 01/06/2019 0933   CL 109 01/06/2019 0933   CO2 25 01/06/2019 0933   BUN 16 01/06/2019 0933   CREATININE 0.89 01/06/2019 0933      Component Value Date/Time   CALCIUM 8.8 (L) 01/06/2019 0933   ALKPHOS 72 01/06/2019 0933   AST 13 (L) 01/06/2019 0933   ALT 16 01/06/2019 0933   BILITOT 0.7 01/06/2019 0933  RADIOGRAPHIC STUDIES:  Mr Jeri Cos UV Contrast  Result Date: 12/13/2018 CLINICAL DATA:  Non-small cell lung cancer.  Staging. EXAM: MRI HEAD WITHOUT AND WITH CONTRAST TECHNIQUE: Multiplanar, multiecho pulse sequences of the brain and surrounding structures were obtained without and with intravenous contrast. CONTRAST:  10 cc Gadavist COMPARISON:  None. FINDINGS: Brain: Diffusion imaging does not show any acute or subacute infarction. There is moderate generalized atrophy. There are mild chronic small-vessel ischemic changes of the cerebral hemispheric white matter. There is no evidence of metastatic disease. No hemorrhage, hydrocephalus or extra-axial collection. Vascular: Major vessels at the base  of the brain show flow. Skull and upper cervical spine: Negative Sinuses/Orbits: Sinuses are clear except for a small amount of fluid in the right maxillary sinus. Orbits appear normal. Small amount of fluid in the left mastoid tip air cells. Other: None IMPRESSION: No evidence of metastatic disease. Atrophy and chronic small-vessel ischemic changes. Electronically Signed   By: Nelson Chimes M.D.   On: 12/13/2018 16:22   Nm Pet Image Initial (pi) Skull Base To Thigh  Result Date: 12/11/2018 CLINICAL DATA:  Initial treatment strategy for Hilar lung mass. Mediastinal adenopathy. Concern for advanced lung cancer. EXAM: NUCLEAR MEDICINE PET SKULL BASE TO THIGH TECHNIQUE: 9.1 mCi F-18 FDG was injected intravenously. Full-ring PET imaging was performed from the skull base to thigh after the radiotracer. CT data was obtained and used for attenuation correction and anatomic localization. Fasting blood glucose: 87 mg/dl COMPARISON:  Chest radiograph 11/11/2018.  No prior CT or PET. FINDINGS: Mediastinal blood pool activity: SUV max 2.4 NECK: No areas of abnormal hypermetabolism. Incidental CT findings: Bilateral carotid atherosclerosis. No cervical adenopathy. CHEST: Hypermetabolic subcarinal adenopathy at 2.7 cm and a S.U.V. max of 26.1 on image 105/3. Left hilar adenopathy at on the order of 2.1 x 3.7 cm and a S.U.V. max of 29.1 on image 107/3. Vague left lower lobe subpleural pulmonary nodule measures 1.0 cm and a S.U.V. max of 4.8 on image 114/3. Incidental CT findings: Aortic and coronary artery atherosclerosis. Bullous type emphysema. Minimal dependent right lower lobe pulmonary opacity, favored to represent atelectasis. Posterior lingular and central left lower lobe peribronchovascular nodularity and interstitial thickening. ABDOMEN/PELVIS: Low-level hypermetabolism corresponding to mildly nodular adrenal glands bilaterally. No well-defined dominant mass. No abdominopelvic nodal hypermetabolism. Incidental CT  findings: Abdominal aortic atherosclerosis. A segment 4 low-density 12 mm lever lesion is favored to represent a cyst or minimally complex cyst. Mild renal cortical thinning bilaterally. Bilateral low-density renal lesions are likely cysts. There also left renal lesions which demonstrate complexity and are therefore indeterminate. Example exophytic off the upper pole left kidney at 2.8 cm. Abdominal aortic and branch vessel atherosclerosis. Extensive colonic diverticulosis. Mild to moderate prostatomegaly. Fat containing left inguinal hernia. SKELETON: Relatively diffuse, mild marrow hypermetabolism. Incidental CT findings: No focal osseous lesion. IMPRESSION: 1. Hypermetabolic mediastinal and left hilar adenopathy. Differential considerations include small-cell lung cancer or a lymphoproliferative process such as lymphoma. 2. Reticulonodular lingular and central left lower lobe opacities are likely related to postobstructive pneumonitis from left hilar adenopathy. 3. Hypermetabolic subpleural left lower lobe pulmonary nodule which could represent a primary bronchogenic carcinoma (i.e. Small-cell lung cancer can present in this way) or also be related to postobstructive pneumonitis. 4. No hypermetabolic extrathoracic metastasis. 5. Aortic atherosclerosis (ICD10-I70.0), coronary artery atherosclerosis and emphysema (ICD10-J43.9). 6. Indeterminate left renal lesions which could represent complex cysts or solid neoplasms. Consider nonemergent pre and post contrast abdominal MRI (preferred) or CT. 7. Low-level hypermetabolism within both  adrenal glands, without well-defined dominant mass. Favored to be physiologic. Recommend attention on follow-up. Electronically Signed   By: Abigail Miyamoto M.D.   On: 12/11/2018 14:31     ASSESSMENT/PLAN:  This is a very pleasant 78 year old caucasian male with stage IIIb non-small cell lung cancer consistent with adenocarcinoma. He is currently undergoing concurrent chemoradiation  with weekly carboplatin for AUC of 2 and paclitaxel 45 mg/m2 followed by consolidation with immunotherapy if the patient has no evidence of disease progression after the induction phase. The patient is status post cycle 1.   The patient was seen with Dr. Julien Nordmann today. The patient tolerated his treatment well with no concerning adverse effects. He reports "teeth soreness" which he was advised to rinse his mouth a couple times a day with salt water and biotene OTC.  The labs were reviewed with the patient today and I recommend for him to proceed with cycle number 2 tomorrow as scheduled.   Regarding his blood pressure management, the patient was advised to monitor his BP at home and to maintain adequate hydration. Blood pressure medication adjustment will be deferred to his PCP who manages his hypertension. The patient understands and states he will follow-up with his PCP.   The patient's hemoglobin was stable at 9.8 today. No intervention is indicated at this time. Will continue to monitor.   The patient was advised to call immediately if he has any concerning symptoms in the interval. The patient voices understanding of current disease status and treatment options and is in agreement with the current care plan. All questions were answered. The patient knows to call the clinic with any problems, questions or concerns. We can certainly see the patient much sooner if necessary.    No orders of the defined types were placed in this encounter.    Finnis Colee L Keymiah Lyles, PA-C 01/06/19  ADDENDUM: Hematology/Oncology Attending: I had a face-to-face encounter with the patient today.  I recommended his care plan.  This is a very pleasant 78 years old white male recently diagnosed with a stage IIIb non-small cell lung cancer.  He is currently undergoing a course of concurrent chemoradiation with weekly carboplatin and paclitaxel.  Status post 1 cycle.  The patient tolerated the first week of his  treatment well. I recommended for him to proceed with cycle #2 today as scheduled. He had molecular studies performed by [employer] that showed no actionable mutations. For the dental soreness the patient was advised to use salt water and Biotene as needed. He will come back for follow-up visit in 2 weeks for evaluation before cycle #4 of his treatment. The patient was advised to call immediately if he has any concerning symptoms in the interval.  Disclaimer: This note was dictated with voice recognition software. Similar sounding words can inadvertently be transcribed and may be missed upon review. Eilleen Kempf, MD 01/06/19

## 2019-01-07 ENCOUNTER — Ambulatory Visit
Admission: RE | Admit: 2019-01-07 | Discharge: 2019-01-07 | Disposition: A | Discharge status: Home / Self Care | Payer: Medicare HMO | Source: Ambulatory Visit | Attending: Radiation Oncology | Admitting: Radiation Oncology

## 2019-01-07 ENCOUNTER — Inpatient Hospital Stay: Payer: Medicare HMO

## 2019-01-07 ENCOUNTER — Encounter: Payer: Self-pay | Admitting: Internal Medicine

## 2019-01-07 ENCOUNTER — Other Ambulatory Visit: Payer: Medicare HMO

## 2019-01-07 VITALS — BP 117/59 | HR 82 | Temp 98.5°F | Resp 17

## 2019-01-07 DIAGNOSIS — Z510 Encounter for antineoplastic radiation therapy: Secondary | ICD-10-CM | POA: Diagnosis not present

## 2019-01-07 DIAGNOSIS — J029 Acute pharyngitis, unspecified: Secondary | ICD-10-CM | POA: Diagnosis not present

## 2019-01-07 DIAGNOSIS — J449 Chronic obstructive pulmonary disease, unspecified: Secondary | ICD-10-CM | POA: Diagnosis not present

## 2019-01-07 DIAGNOSIS — C3492 Malignant neoplasm of unspecified part of left bronchus or lung: Secondary | ICD-10-CM

## 2019-01-07 DIAGNOSIS — I959 Hypotension, unspecified: Secondary | ICD-10-CM | POA: Diagnosis not present

## 2019-01-07 DIAGNOSIS — I10 Essential (primary) hypertension: Secondary | ICD-10-CM | POA: Diagnosis not present

## 2019-01-07 DIAGNOSIS — K0889 Other specified disorders of teeth and supporting structures: Secondary | ICD-10-CM | POA: Diagnosis not present

## 2019-01-07 DIAGNOSIS — K219 Gastro-esophageal reflux disease without esophagitis: Secondary | ICD-10-CM | POA: Diagnosis not present

## 2019-01-07 DIAGNOSIS — C3412 Malignant neoplasm of upper lobe, left bronchus or lung: Secondary | ICD-10-CM | POA: Diagnosis not present

## 2019-01-07 DIAGNOSIS — Z5111 Encounter for antineoplastic chemotherapy: Secondary | ICD-10-CM | POA: Diagnosis not present

## 2019-01-07 DIAGNOSIS — K5900 Constipation, unspecified: Secondary | ICD-10-CM | POA: Diagnosis not present

## 2019-01-07 MED ORDER — FAMOTIDINE IN NACL 20-0.9 MG/50ML-% IV SOLN
INTRAVENOUS | Status: AC
  Filled 2019-01-07: qty 50

## 2019-01-07 MED ORDER — DIPHENHYDRAMINE HCL 50 MG/ML IJ SOLN
INTRAMUSCULAR | Status: AC
  Filled 2019-01-07: qty 1

## 2019-01-07 MED ORDER — PALONOSETRON HCL INJECTION 0.25 MG/5ML
0.2500 mg | Freq: Once | INTRAVENOUS | Status: AC
  Administered 2019-01-07: 0.25 mg via INTRAVENOUS

## 2019-01-07 MED ORDER — SODIUM CHLORIDE 0.9 % IV SOLN
Freq: Once | INTRAVENOUS | Status: AC
  Administered 2019-01-07: 13:00:00 via INTRAVENOUS
  Filled 2019-01-07: qty 250

## 2019-01-07 MED ORDER — PALONOSETRON HCL INJECTION 0.25 MG/5ML
INTRAVENOUS | Status: AC
  Filled 2019-01-07: qty 5

## 2019-01-07 MED ORDER — SODIUM CHLORIDE 0.9 % IV SOLN: 20.0000 mg | Freq: Once | INTRAVENOUS | Status: DC

## 2019-01-07 MED ORDER — SODIUM CHLORIDE 0.9 % IV SOLN
45.0000 mg/m2 | Freq: Once | INTRAVENOUS | Status: AC
  Administered 2019-01-07: 90 mg via INTRAVENOUS
  Filled 2019-01-07: qty 15

## 2019-01-07 MED ORDER — SODIUM CHLORIDE 0.9 % IV SOLN
190.2000 mg | Freq: Once | INTRAVENOUS | Status: AC
  Administered 2019-01-07: 190 mg via INTRAVENOUS
  Filled 2019-01-07: qty 19

## 2019-01-07 MED ORDER — DIPHENHYDRAMINE HCL 50 MG/ML IJ SOLN
50.0000 mg | Freq: Once | INTRAMUSCULAR | Status: AC
  Administered 2019-01-07: 50 mg via INTRAVENOUS

## 2019-01-07 MED ORDER — SODIUM CHLORIDE 0.9 % IV SOLN
20.0000 mg | Freq: Once | INTRAVENOUS | Status: AC
  Administered 2019-01-07: 20 mg via INTRAVENOUS
  Filled 2019-01-07: qty 2

## 2019-01-07 MED ORDER — FAMOTIDINE IN NACL 20-0.9 MG/50ML-% IV SOLN
20.0000 mg | Freq: Once | INTRAVENOUS | Status: AC
  Administered 2019-01-07: 20 mg via INTRAVENOUS

## 2019-01-07 NOTE — Progress Notes (Signed)
Met with patient and daughter in law to introduce myself as Arboriculturist and to offer available resources. There is no copay assistance available for his diagnosis.  Discussed one-time $42 [place] and qualifications to assist patient with personal expenses while going through treatment. Based on verbal income, he does qualify for a household of 1.  Gave him my card and information needed to apply for the grant as well as my contact name and number for any additional financial questions or concerns. They verbalized understanding.

## 2019-01-07 NOTE — Patient Instructions (Signed)
[facility] Discharge Instructions for Patients Receiving Chemotherapy  Today you received the following chemotherapy agents: Paclitaxel (Taxol) and Carboplatin (Paraplatin)  To help prevent nausea and vomiting after your treatment, we encourage you to take your nausea medication as directed.    If you develop nausea and vomiting that is not controlled by your nausea medication, call the clinic.   BELOW ARE SYMPTOMS THAT SHOULD BE REPORTED IMMEDIATELY:  *FEVER GREATER THAN 100.5 F  *CHILLS WITH OR WITHOUT FEVER  NAUSEA AND VOMITING THAT IS NOT CONTROLLED WITH YOUR NAUSEA MEDICATION  *UNUSUAL SHORTNESS OF BREATH  *UNUSUAL BRUISING OR BLEEDING  TENDERNESS IN MOUTH AND THROAT WITH OR WITHOUT PRESENCE OF ULCERS  *URINARY PROBLEMS  *BOWEL PROBLEMS  UNUSUAL RASH Items with * indicate a potential emergency and should be followed up as soon as possible.  Feel free to call the clinic should you have any questions or concerns. The clinic phone number is [phone].  Please show the [facility] at check-in to the Emergency Department and triage nurse.

## 2019-01-08 ENCOUNTER — Ambulatory Visit
Admission: RE | Admit: 2019-01-08 | Discharge: 2019-01-08 | Disposition: A | Discharge status: Home / Self Care | Payer: Medicare HMO | Source: Ambulatory Visit | Attending: Radiation Oncology | Admitting: Radiation Oncology

## 2019-01-08 DIAGNOSIS — Z510 Encounter for antineoplastic radiation therapy: Secondary | ICD-10-CM | POA: Diagnosis not present

## 2019-01-08 DIAGNOSIS — C3412 Malignant neoplasm of upper lobe, left bronchus or lung: Secondary | ICD-10-CM | POA: Diagnosis not present

## 2019-01-09 ENCOUNTER — Ambulatory Visit
Admission: RE | Admit: 2019-01-09 | Discharge: 2019-01-09 | Disposition: A | Discharge status: Home / Self Care | Payer: Medicare HMO | Source: Ambulatory Visit | Attending: Radiation Oncology | Admitting: Radiation Oncology

## 2019-01-09 DIAGNOSIS — Z510 Encounter for antineoplastic radiation therapy: Secondary | ICD-10-CM | POA: Diagnosis not present

## 2019-01-09 DIAGNOSIS — C3412 Malignant neoplasm of upper lobe, left bronchus or lung: Secondary | ICD-10-CM | POA: Diagnosis not present

## 2019-01-10 ENCOUNTER — Other Ambulatory Visit: Payer: Self-pay | Admitting: Radiation Oncology

## 2019-01-10 ENCOUNTER — Ambulatory Visit
Admission: RE | Admit: 2019-01-10 | Discharge: 2019-01-10 | Disposition: A | Discharge status: Home / Self Care | Payer: Medicare HMO | Source: Ambulatory Visit | Attending: Radiation Oncology | Admitting: Radiation Oncology

## 2019-01-10 ENCOUNTER — Encounter: Payer: Self-pay | Admitting: Internal Medicine

## 2019-01-10 DIAGNOSIS — C3412 Malignant neoplasm of upper lobe, left bronchus or lung: Secondary | ICD-10-CM | POA: Diagnosis not present

## 2019-01-10 DIAGNOSIS — Z510 Encounter for antineoplastic radiation therapy: Secondary | ICD-10-CM | POA: Diagnosis not present

## 2019-01-10 MED ORDER — SUCRALFATE 1 G PO TABS: 1.0000 g | ORAL_TABLET | Freq: Four times a day (QID) | ORAL | 2 refills | Status: DC

## 2019-01-10 NOTE — Progress Notes (Signed)
Pt is approved for the $700 [facility] grant.  °

## 2019-01-13 ENCOUNTER — Inpatient Hospital Stay: Payer: Medicare HMO

## 2019-01-13 ENCOUNTER — Ambulatory Visit
Admission: RE | Admit: 2019-01-13 | Discharge: 2019-01-13 | Disposition: A | Discharge status: Home / Self Care | Payer: Medicare HMO | Source: Ambulatory Visit | Attending: Radiation Oncology | Admitting: Radiation Oncology

## 2019-01-13 VITALS — BP 114/63 | HR 93 | Temp 98.2°F | Resp 18 | Wt 175.0 lb

## 2019-01-13 DIAGNOSIS — J029 Acute pharyngitis, unspecified: Secondary | ICD-10-CM | POA: Diagnosis not present

## 2019-01-13 DIAGNOSIS — K0889 Other specified disorders of teeth and supporting structures: Secondary | ICD-10-CM | POA: Diagnosis not present

## 2019-01-13 DIAGNOSIS — K219 Gastro-esophageal reflux disease without esophagitis: Secondary | ICD-10-CM | POA: Diagnosis not present

## 2019-01-13 DIAGNOSIS — Z510 Encounter for antineoplastic radiation therapy: Secondary | ICD-10-CM | POA: Diagnosis not present

## 2019-01-13 DIAGNOSIS — J449 Chronic obstructive pulmonary disease, unspecified: Secondary | ICD-10-CM | POA: Diagnosis not present

## 2019-01-13 DIAGNOSIS — C3492 Malignant neoplasm of unspecified part of left bronchus or lung: Secondary | ICD-10-CM

## 2019-01-13 DIAGNOSIS — I959 Hypotension, unspecified: Secondary | ICD-10-CM | POA: Diagnosis not present

## 2019-01-13 DIAGNOSIS — C3412 Malignant neoplasm of upper lobe, left bronchus or lung: Secondary | ICD-10-CM | POA: Diagnosis not present

## 2019-01-13 DIAGNOSIS — I10 Essential (primary) hypertension: Secondary | ICD-10-CM | POA: Diagnosis not present

## 2019-01-13 DIAGNOSIS — K5900 Constipation, unspecified: Secondary | ICD-10-CM | POA: Diagnosis not present

## 2019-01-13 DIAGNOSIS — Z5111 Encounter for antineoplastic chemotherapy: Secondary | ICD-10-CM | POA: Diagnosis not present

## 2019-01-13 LAB — CMP (CANCER CENTER ONLY)
ALT: 15 U/L (ref 0–44)
AST: 12 U/L — ABNORMAL LOW (ref 15–41)
Albumin: 3.6 g/dL (ref 3.5–5.0)
Alkaline Phosphatase: 78 U/L (ref 38–126)
Anion gap: 9 (ref 5–15)
BUN: 20 mg/dL (ref 8–23)
CO2: 21 mmol/L — ABNORMAL LOW (ref 22–32)
Calcium: 8 mg/dL — ABNORMAL LOW (ref 8.9–10.3)
Chloride: 107 mmol/L (ref 98–111)
Creatinine: 0.87 mg/dL (ref 0.61–1.24)
GFR, Est AFR Am: >60 mL/min (ref >60)
GFR, Estimated: >60 mL/min (ref >60)
Glucose, Bld: 103 mg/dL — ABNORMAL HIGH (ref 70–99)
Potassium: 4.3 mmol/L (ref 3.5–5.1)
Sodium: 137 mmol/L (ref 135–145)
Total Bilirubin: 0.4 mg/dL (ref 0.3–1.2)
Total Protein: 6 g/dL — ABNORMAL LOW (ref 6.5–8.1)

## 2019-01-13 LAB — CBC WITH DIFFERENTIAL (CANCER CENTER ONLY)
Abs Immature Granulocytes: 0.02 10*3/uL (ref 0.00–0.07)
Basophils Absolute: 0.1 10*3/uL (ref 0.0–0.1)
Basophils Relative: 2 %
Eosinophils Absolute: 0.1 10*3/uL (ref 0.0–0.5)
Eosinophils Relative: 3 %
HCT: 30.9 % — ABNORMAL LOW (ref 39.0–52.0)
Hemoglobin: 10.1 g/dL — ABNORMAL LOW (ref 13.0–17.0)
Immature Granulocytes: 1 %
Lymphocytes Relative: 13 %
Lymphs Abs: 0.5 10*3/uL — ABNORMAL LOW (ref 0.7–4.0)
MCH: 34.7 pg — ABNORMAL HIGH (ref 26.0–34.0)
MCHC: 32.7 g/dL (ref 30.0–36.0)
MCV: 106.2 fL — ABNORMAL HIGH (ref 80.0–100.0)
Monocytes Absolute: 0.3 10*3/uL (ref 0.1–1.0)
Monocytes Relative: 7 %
Neutro Abs: 3 10*3/uL (ref 1.7–7.7)
Neutrophils Relative %: 74 %
Platelet Count: 176 10*3/uL (ref 150–400)
RBC: 2.91 MIL/uL — ABNORMAL LOW (ref 4.22–5.81)
RDW: 15.3 % (ref 11.5–15.5)
WBC Count: 4 10*3/uL (ref 4.0–10.5)
nRBC: 0 % (ref 0.0–0.2)

## 2019-01-13 MED ORDER — PALONOSETRON HCL INJECTION 0.25 MG/5ML
0.2500 mg | Freq: Once | INTRAVENOUS | Status: AC
  Administered 2019-01-13: 0.25 mg via INTRAVENOUS

## 2019-01-13 MED ORDER — FAMOTIDINE IN NACL 20-0.9 MG/50ML-% IV SOLN
20.0000 mg | Freq: Once | INTRAVENOUS | Status: AC
  Administered 2019-01-13: 20 mg via INTRAVENOUS

## 2019-01-13 MED ORDER — DIPHENHYDRAMINE HCL 50 MG/ML IJ SOLN
INTRAMUSCULAR | Status: AC
  Filled 2019-01-13: qty 1

## 2019-01-13 MED ORDER — DIPHENHYDRAMINE HCL 50 MG/ML IJ SOLN
50.0000 mg | Freq: Once | INTRAMUSCULAR | Status: AC
  Administered 2019-01-13: 50 mg via INTRAVENOUS

## 2019-01-13 MED ORDER — FAMOTIDINE IN NACL 20-0.9 MG/50ML-% IV SOLN
INTRAVENOUS | Status: AC
  Filled 2019-01-13: qty 50

## 2019-01-13 MED ORDER — SODIUM CHLORIDE 0.9 % IV SOLN
45.0000 mg/m2 | Freq: Once | INTRAVENOUS | Status: AC
  Administered 2019-01-13: 90 mg via INTRAVENOUS
  Filled 2019-01-13: qty 15

## 2019-01-13 MED ORDER — SODIUM CHLORIDE 0.9 % IV SOLN
Freq: Once | INTRAVENOUS | Status: AC
  Administered 2019-01-13: 14:00:00 via INTRAVENOUS
  Filled 2019-01-13: qty 250

## 2019-01-13 MED ORDER — PALONOSETRON HCL INJECTION 0.25 MG/5ML
INTRAVENOUS | Status: AC
  Filled 2019-01-13: qty 5

## 2019-01-13 MED ORDER — SODIUM CHLORIDE 0.9 % IV SOLN
190.2000 mg | Freq: Once | INTRAVENOUS | Status: AC
  Administered 2019-01-13: 190 mg via INTRAVENOUS
  Filled 2019-01-13: qty 19

## 2019-01-13 MED ORDER — SODIUM CHLORIDE 0.9 % IV SOLN
20.0000 mg | Freq: Once | INTRAVENOUS | Status: AC
  Administered 2019-01-13: 20 mg via INTRAVENOUS
  Filled 2019-01-13: qty 20

## 2019-01-13 NOTE — Patient Instructions (Signed)
[facility] Discharge Instructions for Patients Receiving Chemotherapy  Today you received the following chemotherapy agents: Paclitaxel (Taxol) and Carboplatin (Paraplatin)  To help prevent nausea and vomiting after your treatment, we encourage you to take your nausea medication as directed.    If you develop nausea and vomiting that is not controlled by your nausea medication, call the clinic.   BELOW ARE SYMPTOMS THAT SHOULD BE REPORTED IMMEDIATELY:  *FEVER GREATER THAN 100.5 F  *CHILLS WITH OR WITHOUT FEVER  NAUSEA AND VOMITING THAT IS NOT CONTROLLED WITH YOUR NAUSEA MEDICATION  *UNUSUAL SHORTNESS OF BREATH  *UNUSUAL BRUISING OR BLEEDING  TENDERNESS IN MOUTH AND THROAT WITH OR WITHOUT PRESENCE OF ULCERS  *URINARY PROBLEMS  *BOWEL PROBLEMS  UNUSUAL RASH Items with * indicate a potential emergency and should be followed up as soon as possible.  Feel free to call the clinic should you have any questions or concerns. The clinic phone number is [phone].  Please show the [place] at check-in to the Emergency Department and triage nurse.

## 2019-01-14 ENCOUNTER — Ambulatory Visit
Admission: RE | Admit: 2019-01-14 | Discharge: 2019-01-14 | Disposition: A | Discharge status: Home / Self Care | Payer: Medicare HMO | Source: Ambulatory Visit | Attending: Radiation Oncology | Admitting: Radiation Oncology

## 2019-01-14 DIAGNOSIS — C3412 Malignant neoplasm of upper lobe, left bronchus or lung: Secondary | ICD-10-CM | POA: Diagnosis not present

## 2019-01-14 DIAGNOSIS — Z510 Encounter for antineoplastic radiation therapy: Secondary | ICD-10-CM | POA: Diagnosis not present

## 2019-01-15 ENCOUNTER — Ambulatory Visit
Admission: RE | Admit: 2019-01-15 | Discharge: 2019-01-15 | Disposition: A | Discharge status: Home / Self Care | Payer: Medicare HMO | Source: Ambulatory Visit | Attending: Radiation Oncology | Admitting: Radiation Oncology

## 2019-01-15 DIAGNOSIS — C3412 Malignant neoplasm of upper lobe, left bronchus or lung: Secondary | ICD-10-CM | POA: Diagnosis not present

## 2019-01-15 DIAGNOSIS — Z510 Encounter for antineoplastic radiation therapy: Secondary | ICD-10-CM | POA: Diagnosis not present

## 2019-01-16 ENCOUNTER — Ambulatory Visit
Admission: RE | Admit: 2019-01-16 | Discharge: 2019-01-16 | Disposition: A | Discharge status: Home / Self Care | Payer: Medicare HMO | Source: Ambulatory Visit | Attending: Radiation Oncology | Admitting: Radiation Oncology

## 2019-01-16 ENCOUNTER — Other Ambulatory Visit: Payer: Self-pay

## 2019-01-16 DIAGNOSIS — C3412 Malignant neoplasm of upper lobe, left bronchus or lung: Secondary | ICD-10-CM | POA: Diagnosis not present

## 2019-01-16 DIAGNOSIS — Z510 Encounter for antineoplastic radiation therapy: Secondary | ICD-10-CM | POA: Diagnosis not present

## 2019-01-16 NOTE — Patient Outreach (Signed)
[facility]) Care Management  01/16/2019  RODRIGUEZ AGUINALDO 04-12-1941 [id number]  TELEPHONE SCREENING Referral date: 01/16/19 Referral source: [facility] Referral reason:  [facility] requesting joint visit with community case manager pending discharge from home health services.  Insurance: [employer]  Telephone call to patient regarding [facility] referral. HIPAA verified with patient. Explained reason for call.   Patient states he does not feel he needs additional nursing follow up.  Patient states he has daily radiation and is receiving chemotherapy.   RNCM left voice mail message for [employer], social work navigator with [facility] regarding patients refusal of [facility] care management services.   PLAN:  RNCM will close patient due to patient refusing services.  RNCM will send patient [facility] care management brochure/ magnet.  RNCM will send closure letter to patient primary MD.   Quinn Plowman RN,BSN,CCM [facility] Telephonic  [phone]

## 2019-01-17 ENCOUNTER — Ambulatory Visit
Admission: RE | Admit: 2019-01-17 | Discharge: 2019-01-17 | Disposition: A | Discharge status: Home / Self Care | Payer: Medicare HMO | Source: Ambulatory Visit | Attending: Radiation Oncology | Admitting: Radiation Oncology

## 2019-01-17 DIAGNOSIS — Z510 Encounter for antineoplastic radiation therapy: Secondary | ICD-10-CM | POA: Diagnosis not present

## 2019-01-17 DIAGNOSIS — C3412 Malignant neoplasm of upper lobe, left bronchus or lung: Secondary | ICD-10-CM | POA: Diagnosis not present

## 2019-01-20 ENCOUNTER — Inpatient Hospital Stay (HOSPITAL_BASED_OUTPATIENT_CLINIC_OR_DEPARTMENT_OTHER): Payer: Medicare HMO | Admitting: Adult Health

## 2019-01-20 ENCOUNTER — Encounter: Payer: Self-pay | Admitting: Adult Health

## 2019-01-20 ENCOUNTER — Other Ambulatory Visit: Payer: Self-pay | Admitting: Radiation Oncology

## 2019-01-20 ENCOUNTER — Inpatient Hospital Stay: Payer: Medicare HMO

## 2019-01-20 ENCOUNTER — Telehealth: Payer: Self-pay | Admitting: *Deleted

## 2019-01-20 ENCOUNTER — Ambulatory Visit
Admission: RE | Admit: 2019-01-20 | Discharge: 2019-01-20 | Disposition: A | Discharge status: Home / Self Care | Payer: Medicare HMO | Source: Ambulatory Visit | Attending: Radiation Oncology | Admitting: Radiation Oncology

## 2019-01-20 VITALS — BP 107/49 | HR 92 | Temp 98.4°F | Resp 18 | Ht 68.0 in | Wt 176.5 lb

## 2019-01-20 VITALS — BP 124/68

## 2019-01-20 DIAGNOSIS — Z5111 Encounter for antineoplastic chemotherapy: Secondary | ICD-10-CM | POA: Diagnosis not present

## 2019-01-20 DIAGNOSIS — K5900 Constipation, unspecified: Secondary | ICD-10-CM | POA: Diagnosis not present

## 2019-01-20 DIAGNOSIS — I10 Essential (primary) hypertension: Secondary | ICD-10-CM

## 2019-01-20 DIAGNOSIS — Z79899 Other long term (current) drug therapy: Secondary | ICD-10-CM

## 2019-01-20 DIAGNOSIS — C3412 Malignant neoplasm of upper lobe, left bronchus or lung: Secondary | ICD-10-CM

## 2019-01-20 DIAGNOSIS — J449 Chronic obstructive pulmonary disease, unspecified: Secondary | ICD-10-CM | POA: Diagnosis not present

## 2019-01-20 DIAGNOSIS — K0889 Other specified disorders of teeth and supporting structures: Secondary | ICD-10-CM | POA: Diagnosis not present

## 2019-01-20 DIAGNOSIS — K219 Gastro-esophageal reflux disease without esophagitis: Secondary | ICD-10-CM

## 2019-01-20 DIAGNOSIS — C3492 Malignant neoplasm of unspecified part of left bronchus or lung: Secondary | ICD-10-CM

## 2019-01-20 DIAGNOSIS — Z510 Encounter for antineoplastic radiation therapy: Secondary | ICD-10-CM | POA: Diagnosis not present

## 2019-01-20 DIAGNOSIS — J029 Acute pharyngitis, unspecified: Secondary | ICD-10-CM

## 2019-01-20 DIAGNOSIS — Z87891 Personal history of nicotine dependence: Secondary | ICD-10-CM | POA: Diagnosis not present

## 2019-01-20 DIAGNOSIS — Z7982 Long term (current) use of aspirin: Secondary | ICD-10-CM | POA: Diagnosis not present

## 2019-01-20 DIAGNOSIS — I959 Hypotension, unspecified: Secondary | ICD-10-CM | POA: Diagnosis not present

## 2019-01-20 LAB — CMP (CANCER CENTER ONLY)
ALT: 15 U/L (ref 0–44)
AST: 13 U/L — ABNORMAL LOW (ref 15–41)
Albumin: 3.7 g/dL (ref 3.5–5.0)
Alkaline Phosphatase: 79 U/L (ref 38–126)
Anion gap: 9 (ref 5–15)
BUN: 22 mg/dL (ref 8–23)
CO2: 23 mmol/L (ref 22–32)
Calcium: 8.7 mg/dL — ABNORMAL LOW (ref 8.9–10.3)
Chloride: 107 mmol/L (ref 98–111)
Creatinine: 0.86 mg/dL (ref 0.61–1.24)
GFR, Est AFR Am: >60 mL/min (ref >60)
GFR, Estimated: >60 mL/min (ref >60)
Glucose, Bld: 99 mg/dL (ref 70–99)
Potassium: 4.4 mmol/L (ref 3.5–5.1)
Sodium: 139 mmol/L (ref 135–145)
Total Bilirubin: 0.7 mg/dL (ref 0.3–1.2)
Total Protein: 6.2 g/dL — ABNORMAL LOW (ref 6.5–8.1)

## 2019-01-20 LAB — CBC WITH DIFFERENTIAL (CANCER CENTER ONLY)
Abs Immature Granulocytes: 0.03 10*3/uL (ref 0.00–0.07)
Basophils Absolute: 0.1 10*3/uL (ref 0.0–0.1)
Basophils Relative: 1 %
Eosinophils Absolute: 0 10*3/uL (ref 0.0–0.5)
Eosinophils Relative: 1 %
HCT: 28.7 % — ABNORMAL LOW (ref 39.0–52.0)
Hemoglobin: 9.3 g/dL — ABNORMAL LOW (ref 13.0–17.0)
Immature Granulocytes: 1 %
Lymphocytes Relative: 6 %
Lymphs Abs: 0.4 10*3/uL — ABNORMAL LOW (ref 0.7–4.0)
MCH: 34.8 pg — ABNORMAL HIGH (ref 26.0–34.0)
MCHC: 32.4 g/dL (ref 30.0–36.0)
MCV: 107.5 fL — ABNORMAL HIGH (ref 80.0–100.0)
Monocytes Absolute: 0.6 10*3/uL (ref 0.1–1.0)
Monocytes Relative: 11 %
Neutro Abs: 4.9 10*3/uL (ref 1.7–7.7)
Neutrophils Relative %: 80 %
Platelet Count: 172 10*3/uL (ref 150–400)
RBC: 2.67 MIL/uL — ABNORMAL LOW (ref 4.22–5.81)
RDW: 16.5 % — ABNORMAL HIGH (ref 11.5–15.5)
WBC Count: 6 10*3/uL (ref 4.0–10.5)
nRBC: 0 % (ref 0.0–0.2)

## 2019-01-20 LAB — GUARDANT 360

## 2019-01-20 MED ORDER — SODIUM CHLORIDE 0.9 % IV SOLN
INTRAVENOUS | Status: DC
  Administered 2019-01-20: 15:00:00 via INTRAVENOUS
  Filled 2019-01-20 (×2): qty 250

## 2019-01-20 MED ORDER — DIPHENHYDRAMINE HCL 50 MG/ML IJ SOLN
INTRAMUSCULAR | Status: AC
  Filled 2019-01-20: qty 1

## 2019-01-20 MED ORDER — SODIUM CHLORIDE 0.9 % IV SOLN
190.2000 mg | Freq: Once | INTRAVENOUS | Status: AC
  Administered 2019-01-20: 190 mg via INTRAVENOUS
  Filled 2019-01-20: qty 19

## 2019-01-20 MED ORDER — DIPHENHYDRAMINE HCL 50 MG/ML IJ SOLN
50.0000 mg | Freq: Once | INTRAMUSCULAR | Status: AC
  Administered 2019-01-20: 50 mg via INTRAVENOUS

## 2019-01-20 MED ORDER — PALONOSETRON HCL INJECTION 0.25 MG/5ML
0.2500 mg | Freq: Once | INTRAVENOUS | Status: AC
  Administered 2019-01-20: 0.25 mg via INTRAVENOUS

## 2019-01-20 MED ORDER — PALONOSETRON HCL INJECTION 0.25 MG/5ML
INTRAVENOUS | Status: AC
  Filled 2019-01-20: qty 5

## 2019-01-20 MED ORDER — FAMOTIDINE IN NACL 20-0.9 MG/50ML-% IV SOLN: 20.0000 mg | Freq: Once | INTRAVENOUS | Status: DC

## 2019-01-20 MED ORDER — SODIUM CHLORIDE 0.9 % IV SOLN
Freq: Once | INTRAVENOUS | Status: AC
  Filled 2019-01-20: qty 250

## 2019-01-20 MED ORDER — SODIUM CHLORIDE 0.9 % IV SOLN
20.0000 mg | Freq: Once | INTRAVENOUS | Status: AC
  Administered 2019-01-20: 20 mg via INTRAVENOUS
  Filled 2019-01-20: qty 20

## 2019-01-20 MED ORDER — LIDOCAINE VISCOUS HCL 2 % MT SOLN: 15.0000 mL | Freq: Four times a day (QID) | OROMUCOSAL | 1 refills | Status: DC | PRN

## 2019-01-20 MED ORDER — SODIUM CHLORIDE 0.9 % IV SOLN
20.0000 mg | Freq: Once | INTRAVENOUS | Status: AC
  Administered 2019-01-20: 20 mg via INTRAVENOUS
  Filled 2019-01-20: qty 2

## 2019-01-20 MED ORDER — SODIUM CHLORIDE 0.9 % IV SOLN
45.0000 mg/m2 | Freq: Once | INTRAVENOUS | Status: AC
  Administered 2019-01-20: 90 mg via INTRAVENOUS
  Filled 2019-01-20: qty 15

## 2019-01-20 NOTE — Patient Instructions (Addendum)
[facility] Discharge Instructions for Patients Receiving Chemotherapy  Today you received the following chemotherapy agents:  Taxol and Carboplatin.  To help prevent nausea and vomiting after your treatment, we encourage you to take your nausea medication as directed.   If you develop nausea and vomiting that is not controlled by your nausea medication, call the clinic.   BELOW ARE SYMPTOMS THAT SHOULD BE REPORTED IMMEDIATELY:  *FEVER GREATER THAN 100.5 F  *CHILLS WITH OR WITHOUT FEVER  NAUSEA AND VOMITING THAT IS NOT CONTROLLED WITH YOUR NAUSEA MEDICATION  *UNUSUAL SHORTNESS OF BREATH  *UNUSUAL BRUISING OR BLEEDING  TENDERNESS IN MOUTH AND THROAT WITH OR WITHOUT PRESENCE OF ULCERS  *URINARY PROBLEMS  *BOWEL PROBLEMS  UNUSUAL RASH Items with * indicate a potential emergency and should be followed up as soon as possible.  Feel free to call the clinic should you have any questions or concerns. The clinic phone number is [phone].  Please show the [facility] at check-in to the Emergency Department and triage nurse.   Rehydration, Adult Rehydration is the replacement of body fluids and salts and minerals (electrolytes) that are lost during dehydration. Dehydration is when there is not enough fluid or water in the body. This happens when you lose more fluids than you take in. Common causes of dehydration include:  Vomiting.  Diarrhea.  Excessive sweating, such as from heat exposure or exercise.  Taking medicines that cause the body to lose excess fluid (diuretics).  Impaired kidney function.  Not drinking enough fluid.  Certain illnesses or infections.  Certain poorly controlled long-term (chronic) illnesses, such as diabetes, heart disease, and kidney disease.  Symptoms of mild dehydration may include thirst, dry lips and mouth, dry skin, and dizziness. Symptoms of severe dehydration may include increased heart rate, confusion, fainting,  and not urinating. You can rehydrate by drinking certain fluids or getting fluids through an IV tube, as told by your health care provider. What are the risks? Generally, rehydration is safe. However, one problem that can happen is taking in too much fluid (overhydration). This is rare. If overhydration happens, it can cause an electrolyte imbalance, kidney failure, or a decrease in salt (sodium) levels in the body. How to rehydrate Follow instructions from your health care provider for rehydration. The kind of fluid you should drink and the amount you should drink depend on your condition.  If directed by your health care provider, drink an oral rehydration solution (ORS). This is a drink designed to treat dehydration that is found in pharmacies and retail stores. ? Make an ORS by following instructions on the package. ? Start by drinking small amounts, about  cup (120 mL) every 5-10 minutes. ? Slowly increase how much you drink until you have taken the amount recommended by your health care provider.  Drink enough clear fluids to keep your urine clear or pale yellow. If you were instructed to drink an ORS, finish the ORS first, then start slowly drinking other clear fluids. Drink fluids such as: ? Water. Do not drink only water. Doing that can lead to having too little sodium in your body (hyponatremia). ? Ice chips. ? Fruit juice that you have added water to (diluted juice). ? Low-calorie sports drinks.  If you are severely dehydrated, your health care provider may recommend that you receive fluids through an IV tube in the hospital.  Do not take sodium tablets. Doing that can lead to the condition of having too much sodium in your  body (hypernatremia). Eating while you rehydrate Follow instructions from your health care provider about what to eat while you rehydrate. Your health care provider may recommend that you slowly begin eating regular foods in small amounts.  Eat foods that  contain a healthy balance of electrolytes, such as bananas, oranges, potatoes, tomatoes, and spinach.  Avoid foods that are greasy or contain a lot of fat or sugar.  In some cases, you may get nutrition through a feeding tube that is passed through your nose and into your stomach (nasogastric tube, or NG tube). This may be done if you have uncontrolled vomiting or diarrhea. Beverages to avoid Certain beverages may make dehydration worse. While you rehydrate, avoid:  Alcohol.  Caffeine.  Drinks that contain a lot of sugar. These include: ? High-calorie sports drinks. ? Fruit juice that is not diluted. ? Soda.  Check nutrition labels to see how much sugar or caffeine a beverage contains. Signs of dehydration recovery You may be recovering from dehydration if:  You are urinating more often than before you started rehydrating.  Your urine is clear or pale yellow.  Your energy level improves.  You vomit less frequently.  You have diarrhea less frequently.  Your appetite improves or returns to normal.  You feel less dizzy or less light-headed.  Your skin tone and color start to look more normal. Contact a health care provider if:  You continue to have symptoms of mild dehydration, such as: ? Thirst. ? Dry lips. ? Slightly dry mouth. ? Dry, warm skin. ? Dizziness.  You continue to vomit or have diarrhea. Get help right away if:  You have symptoms of dehydration that get worse.  You feel: ? Confused. ? Weak. ? Like you are going to faint.  You have not urinated in 6-8 hours.  You have very dark urine.  You have trouble breathing.  Your heart rate while sitting still is over 100 beats a minute.  You cannot drink fluids without vomiting.  You have vomiting or diarrhea that: ? Gets worse. ? Does not go away.  You have a fever. This information is not intended to replace advice given to you by your health care provider. Make sure you discuss any questions you  have with your health care provider. Document Released: 02/05/2012 Document Revised: 06/02/2016 Document Reviewed: 01/07/2016 Elsevier Interactive Patient Education  2019 [employer].

## 2019-01-20 NOTE — Progress Notes (Signed)
[facility] Follow up:    [name], MD [address] [place] [address]   DIAGNOSIS: Cancer Staging Non-small cell carcinoma of left lung, stage 3 (HCC) Staging form: Lung, AJCC 8th Edition - Clinical: Stage IIIB (cT1b, cN3, cM0) - Signed by [name], MD on 12/19/2018  Primary malignant neoplasm of left upper lobe of lung ([facility]) Staging form: Lung, AJCC 8th Edition - Clinical stage from 12/12/2018: cT2, cN2, cM0 - Unsigned   SUMMARY OF ONCOLOGIC HISTORY:   Non-small cell carcinoma of left lung, stage 3 ([facility])   12/19/2018 Initial Diagnosis    Non-small cell carcinoma of left lung, stage 3 ([place])    12/19/2018 Cancer Staging    Staging form: Lung, AJCC 8th Edition - Clinical: Stage IIIB (cT1b, cN3, cM0) - Signed by [name], MD on 12/19/2018    12/31/2018 -  Chemotherapy    The patient had palonosetron (ALOXI) injection 0.25 mg, 0.25 mg, Intravenous,  Once, 4 of 7 cycles Administration: 0.25 mg (12/31/2018), 0.25 mg (01/07/2019), 0.25 mg (01/13/2019) CARBOplatin (PARAPLATIN) 190 mg in sodium chloride 0.9 % 250 mL chemo infusion, 190 mg (100 % of original dose 190.2 mg), Intravenous,  Once, 4 of 7 cycles Dose modification: 190.2 mg (original dose 190.2 mg, Cycle 1) Administration: 190 mg (12/31/2018), 190 mg (01/07/2019), 190 mg (01/13/2019) PACLitaxel (TAXOL) 90 mg in sodium chloride 0.9 % 250 mL chemo infusion (</= 80mg /m2), 45 mg/m2 = 90 mg, Intravenous,  Once, 4 of 7 cycles Administration: 90 mg (12/31/2018), 90 mg (01/07/2019), 90 mg (01/13/2019)  for chemotherapy treatment.      CURRENT THERAPY: Taxol/Carbo with concurrent radiation  INTERVAL HISTORY: FREDY GLADU 78 y.o. male returns for evaluation prior to receiving his fourth weekly chemotherapy with Taxol/Carbo.  He is tolerating it moderately well.  He denies peripheral neuropathy.    He has been having sore throat and difficulty swallowing for about a week.  It is slowly  worsening.  He wants to know if there is anything else he can do for it.  He is taking protonix BID and is also taking Carafate four times a day.  He says he is struggling to eat and drink.     Patient Active Problem List   Diagnosis Date Noted  . Non-small cell carcinoma of left lung, stage 3 ([facility]) 12/19/2018  . Goals of care, counseling/discussion 12/19/2018  . Encounter for antineoplastic chemotherapy 12/19/2018  . Primary malignant neoplasm of left upper lobe of lung ([place]) 12/12/2018  . Hilar mass 12/06/2018  . Mediastinal adenopathy 12/06/2018  . Conjunctivitis unspecified 04/10/2013  . Hypokalemia 04/09/2013  . Acute respiratory failure ([place]) 04/09/2013  . ARF (acute renal failure) ([facility]) 04/05/2013  . CAP (community acquired pneumonia) 04/04/2013  . Leukocytosis, unspecified 04/04/2013  . HTN (hypertension) 04/04/2013    has no active allergies.  MEDICAL HISTORY: Past Medical History:  Diagnosis Date  . Cancer ([facility])    skin-   . Complication of anesthesia   . COPD (chronic obstructive pulmonary disease) ([place] [facility])   . Dyspnea   . GERD (gastroesophageal reflux disease)   . Hemoptysis 11/20/2018  . History of hiatal hernia   . Hypertension   . Hypoxia 11/20/2018  . 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 Migraine with visual aura   . Pneumonia 11/20/2018  . PONV (postoperative nausea and vomiting)   . Pulmonary nodule 11/20/2018    SURGICAL HISTORY: Past Surgical History:  Procedure Laterality Date  . COLONOSCOPY W/  POLYPECTOMY    . TONSILLECTOMY    . VASECTOMY    . VIDEO BRONCHOSCOPY WITH ENDOBRONCHIAL ULTRASOUND N/A 12/06/2018   Procedure: VIDEO BRONCHOSCOPY WITH ENDOBRONCHIAL ULTRASOUND;  Surgeon: Garner Nash, DO;  Location: [facility] OR;  Service: Thoracic;  Laterality: N/A;    SOCIAL HISTORY: Social History   Socioeconomic History  . Marital status: Widowed    Spouse name: Not on file  . Number of children: Not on file  . Years of education: Not on file  .  Highest education level: Not on file  Occupational History  . Not on file  Social Needs  . Financial resource strain: Not on file  . Food insecurity:    Worry: Not on file    Inability: Not on file  . Transportation needs:    Medical: No    Non-medical: No  Tobacco Use  . Smoking status: Former Smoker    Years: 30.00    Types: Cigarettes    Last attempt to quit: 2000    Years since quitting: 20.1  . Smokeless tobacco: Never Used  Substance and Sexual Activity  . Alcohol use: No  . Drug use: No  . Sexual activity: Not on file  Lifestyle  . Physical activity:    Days per week: Not on file    Minutes per session: Not on file  . Stress: Not on file  Relationships  . Social connections:    Talks on phone: Not on file    Gets together: Not on file    Attends religious service: Not on file    Active member of club or organization: Not on file    Attends meetings of clubs or organizations: Not on file    Relationship status: Not on file  . Intimate partner violence:    Fear of current or ex partner: Not on file    Emotionally abused: Not on file    Physically abused: Not on file    Forced sexual activity: Not on file  Other Topics Concern  . Not on file  Social History Narrative  . Not on file    FAMILY HISTORY: Family History  Problem Relation Age of Onset  . Cancer - Lung Mother   . Hypertension Father   . CVA Father   . Cancer - Cervical Sister     Review of Systems  Constitutional: Negative for appetite change, chills, fatigue and fever.  HENT:   Positive for sore throat and trouble swallowing. Negative for hearing loss, lump/mass, mouth sores and voice change.   Eyes: Negative for eye problems and icterus.  Respiratory: Negative for chest tightness, cough and shortness of breath.   Cardiovascular: Negative for chest pain, leg swelling and palpitations.  Gastrointestinal: Negative for abdominal distention, abdominal pain, constipation, diarrhea, nausea and  vomiting.  Endocrine: Negative for hot flashes.  Musculoskeletal: Negative for arthralgias and back pain.  Skin: Negative for itching and rash.  Neurological: Negative for dizziness, extremity weakness, headaches and numbness.  Hematological: Negative for adenopathy. Does not bruise/bleed easily.  Psychiatric/Behavioral: Negative for depression. The patient is not nervous/anxious.       PHYSICAL EXAMINATION  ECOG PERFORMANCE STATUS: 2  Vitals:   01/20/19 1310  BP: (!) 107/49  Pulse: 92  Resp: 18  Temp: 98.4 F (36.9 C)  SpO2: 96%    Physical Exam Constitutional:      General: He is not in acute distress.    Appearance: Normal appearance.  HENT:     Head:  Normocephalic and atraumatic.     Nose: No congestion.     Mouth/Throat:     Mouth: Mucous membranes are moist.     Pharynx: Oropharynx is clear. No oropharyngeal exudate or posterior oropharyngeal erythema.  Eyes:     General: No scleral icterus.    Pupils: Pupils are equal, round, and reactive to light.  Neck:     Musculoskeletal: Neck supple.  Cardiovascular:     Rate and Rhythm: Normal rate and regular rhythm.     Pulses: Normal pulses.     Heart sounds: Normal heart sounds.  Pulmonary:     Effort: Pulmonary effort is normal. No respiratory distress.     Breath sounds: Normal breath sounds.  Abdominal:     General: Abdomen is flat. Bowel sounds are normal. There is no distension.     Palpations: Abdomen is soft.     Tenderness: There is no abdominal tenderness.  Musculoskeletal:        General: No swelling or tenderness.  Lymphadenopathy:     Cervical: No cervical adenopathy.  Skin:    General: Skin is warm and dry.     Capillary Refill: Capillary refill takes less than 2 seconds.  Neurological:     General: No focal deficit present.     Mental Status: He is alert and oriented to person, place, and time.  Psychiatric:        Mood and Affect: Mood normal.        Behavior: Behavior normal.      LABORATORY DATA:  CBC    Component Value Date/Time   WBC 6.0 01/20/2019 1218   WBC 12.8 (H) 12/06/2018 1126   RBC 2.67 (L) 01/20/2019 1218   HGB 9.3 (L) 01/20/2019 1218   HCT 28.7 (L) 01/20/2019 1218   PLT 172 01/20/2019 1218   MCV 107.5 (H) 01/20/2019 1218   MCH 34.8 (H) 01/20/2019 1218   MCHC 32.4 01/20/2019 1218   RDW 16.5 (H) 01/20/2019 1218   LYMPHSABS 0.4 (L) 01/20/2019 1218   MONOABS 0.6 01/20/2019 1218   EOSABS 0.0 01/20/2019 1218   BASOSABS 0.1 01/20/2019 1218    CMP     Component Value Date/Time   NA 139 01/20/2019 1218   K 4.4 01/20/2019 1218   CL 107 01/20/2019 1218   CO2 23 01/20/2019 1218   GLUCOSE 99 01/20/2019 1218   BUN 22 01/20/2019 1218   CREATININE 0.86 01/20/2019 1218   CALCIUM 8.7 (L) 01/20/2019 1218   PROT 6.2 (L) 01/20/2019 1218   ALBUMIN 3.7 01/20/2019 1218   AST 13 (L) 01/20/2019 1218   ALT 15 01/20/2019 1218   ALKPHOS 79 01/20/2019 1218   BILITOT 0.7 01/20/2019 1218   GFRNONAA >60 01/20/2019 1218   GFRAA >60 01/20/2019 1218       ASSESSMENT and THERAPY PLAN:   Non-small cell carcinoma of left lung, stage 3 (HCC) [name] is here today for evaluation of his stage IIIB non small cell lung cancer.  He is currently undergoing chemoradiation receiving Taxol and Carbo weekly.    1. Non small cell lung cancer: To receive treatment today with Taxol/Carbo.  He is tolerating this well and has not developed any neuropathy.  His labs are stable and we reviewed those today.    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 I will send a message to Bryson Ha, Dr. Ida Rogue PA-C to check with him and see if there are any modification that can potentially be made to his  radiation fields to avoid his esophagus, or any further treatment modalities.  He was recommended to continue protonix and carafate. We also talked about PO intake and the addition of boost or ensure.  I placed a referral to nutrition to also assist [place] with this.     3. Dehydration: Secondary to number 2.  I wrote for him to receive some IV fluids today with his treatment.    [name] will return weekly for his chemotherapy treatments and continue to receive his radiation daily. He will see Dr. Julien Nordmann in 2 weeks.  I reviewed the above plan with him in detail, and he is in agreement.     Orders Placed This Encounter  Procedures  . Ambulatory referral to Nutrition and Diabetic E    Referral Priority:   Urgent    Referral Type:   Consultation    Referral Reason:   Specialty Services Required    Number of Visits Requested:   1    All questions were answered. The patient knows to call the clinic with any problems, questions or concerns. We can certainly see the patient much sooner if necessary.  A total of (30) minutes of face-to-face time was spent with this patient with greater than 50% of that time in counseling and care-coordination.  This note was electronically signed. Scot Dock, NP 01/20/2019

## 2019-01-20 NOTE — Assessment & Plan Note (Addendum)
Robert Dixon is here today for evaluation of his stage IIIB non small cell lung cancer.  He is currently undergoing chemoradiation receiving Taxol and Carbo weekly.    1. Non small cell lung cancer: To receive treatment today with Taxol/Carbo.  He is tolerating this well and has not developed any neuropathy.  His labs are stable and we reviewed those today.    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 I will send a message to Robert Dixon, Dr. Ida Rogue PA-C to check with him and see if there are any modification that can potentially be made to his radiation fields to avoid his esophagus, or any further treatment modalities.  He was recommended to continue protonix and carafate. We also talked about PO intake and the addition of boost or ensure.  I placed a referral to nutrition to also assist [place] with this.    3. Dehydration: Secondary to number 2.  I wrote for him to receive some IV fluids today with his treatment.    Robert Dixon will return weekly for his chemotherapy treatments and continue to receive his radiation daily. He will see Dr. Julien Dixon in 2 weeks.  I reviewed the above plan with him in detail, and he is in agreement.

## 2019-01-20 NOTE — Telephone Encounter (Signed)
Spoke with Robert Dixon and let him know that we would be sending in a prescription for viscous lidocaine to his pharmacy.  He was directed to take this medication 30-60 minutes prior to medication or eating/drinking.  He was also advised to take caution when he does eat/drink to ensure he does not aspirate.  Advised him to give [place] a call back if he has further questions or concerns.  Will continue to follow as necessary.  Gloriajean Dell. Leonie Green, BSN

## 2019-01-21 ENCOUNTER — Ambulatory Visit
Admission: RE | Admit: 2019-01-21 | Discharge: 2019-01-21 | Disposition: A | Discharge status: Home / Self Care | Payer: Medicare HMO | Source: Ambulatory Visit | Attending: Radiation Oncology | Admitting: Radiation Oncology

## 2019-01-21 DIAGNOSIS — Z510 Encounter for antineoplastic radiation therapy: Secondary | ICD-10-CM | POA: Diagnosis not present

## 2019-01-21 DIAGNOSIS — C3412 Malignant neoplasm of upper lobe, left bronchus or lung: Secondary | ICD-10-CM | POA: Diagnosis not present

## 2019-01-22 ENCOUNTER — Ambulatory Visit
Admission: RE | Admit: 2019-01-22 | Discharge: 2019-01-22 | Disposition: A | Discharge status: Home / Self Care | Payer: Medicare HMO | Source: Ambulatory Visit | Attending: Radiation Oncology | Admitting: Radiation Oncology

## 2019-01-22 DIAGNOSIS — C3412 Malignant neoplasm of upper lobe, left bronchus or lung: Secondary | ICD-10-CM | POA: Diagnosis not present

## 2019-01-22 DIAGNOSIS — Z510 Encounter for antineoplastic radiation therapy: Secondary | ICD-10-CM | POA: Diagnosis not present

## 2019-01-23 ENCOUNTER — Ambulatory Visit
Admission: RE | Admit: 2019-01-23 | Discharge: 2019-01-23 | Disposition: A | Discharge status: Home / Self Care | Payer: Medicare HMO | Source: Ambulatory Visit | Attending: Radiation Oncology | Admitting: Radiation Oncology

## 2019-01-23 DIAGNOSIS — Z510 Encounter for antineoplastic radiation therapy: Secondary | ICD-10-CM | POA: Diagnosis not present

## 2019-01-23 DIAGNOSIS — C3412 Malignant neoplasm of upper lobe, left bronchus or lung: Secondary | ICD-10-CM | POA: Diagnosis not present

## 2019-01-24 ENCOUNTER — Ambulatory Visit
Admission: RE | Admit: 2019-01-24 | Discharge: 2019-01-24 | Disposition: A | Discharge status: Home / Self Care | Payer: Medicare HMO | Source: Ambulatory Visit | Attending: Radiation Oncology | Admitting: Radiation Oncology

## 2019-01-24 DIAGNOSIS — Z510 Encounter for antineoplastic radiation therapy: Secondary | ICD-10-CM | POA: Diagnosis not present

## 2019-01-24 DIAGNOSIS — C3412 Malignant neoplasm of upper lobe, left bronchus or lung: Secondary | ICD-10-CM | POA: Diagnosis not present

## 2019-01-27 ENCOUNTER — Inpatient Hospital Stay: Payer: Medicare HMO

## 2019-01-27 ENCOUNTER — Ambulatory Visit
Admission: RE | Admit: 2019-01-27 | Discharge: 2019-01-27 | Disposition: A | Discharge status: Home / Self Care | Payer: Medicare HMO | Source: Ambulatory Visit | Attending: Radiation Oncology | Admitting: Radiation Oncology

## 2019-01-27 ENCOUNTER — Inpatient Hospital Stay: Payer: Medicare HMO | Attending: Internal Medicine

## 2019-01-27 ENCOUNTER — Inpatient Hospital Stay: Payer: Medicare HMO | Admitting: Nutrition

## 2019-01-27 VITALS — BP 111/55 | HR 94 | Temp 98.1°F | Resp 18

## 2019-01-27 DIAGNOSIS — I10 Essential (primary) hypertension: Secondary | ICD-10-CM | POA: Insufficient documentation

## 2019-01-27 DIAGNOSIS — C3412 Malignant neoplasm of upper lobe, left bronchus or lung: Secondary | ICD-10-CM | POA: Insufficient documentation

## 2019-01-27 DIAGNOSIS — J449 Chronic obstructive pulmonary disease, unspecified: Secondary | ICD-10-CM | POA: Diagnosis not present

## 2019-01-27 DIAGNOSIS — R5381 Other malaise: Secondary | ICD-10-CM | POA: Diagnosis not present

## 2019-01-27 DIAGNOSIS — M6281 Muscle weakness (generalized): Secondary | ICD-10-CM | POA: Diagnosis not present

## 2019-01-27 DIAGNOSIS — R630 Anorexia: Secondary | ICD-10-CM | POA: Diagnosis not present

## 2019-01-27 DIAGNOSIS — K219 Gastro-esophageal reflux disease without esophagitis: Secondary | ICD-10-CM | POA: Diagnosis not present

## 2019-01-27 DIAGNOSIS — J439 Emphysema, unspecified: Secondary | ICD-10-CM | POA: Diagnosis not present

## 2019-01-27 DIAGNOSIS — Z7982 Long term (current) use of aspirin: Secondary | ICD-10-CM | POA: Diagnosis not present

## 2019-01-27 DIAGNOSIS — R5383 Other fatigue: Secondary | ICD-10-CM | POA: Diagnosis not present

## 2019-01-27 DIAGNOSIS — D6481 Anemia due to antineoplastic chemotherapy: Secondary | ICD-10-CM | POA: Diagnosis not present

## 2019-01-27 DIAGNOSIS — Z5111 Encounter for antineoplastic chemotherapy: Secondary | ICD-10-CM | POA: Insufficient documentation

## 2019-01-27 DIAGNOSIS — T451X5S Adverse effect of antineoplastic and immunosuppressive drugs, sequela: Secondary | ICD-10-CM | POA: Insufficient documentation

## 2019-01-27 DIAGNOSIS — C3492 Malignant neoplasm of unspecified part of left bronchus or lung: Secondary | ICD-10-CM

## 2019-01-27 DIAGNOSIS — E876 Hypokalemia: Secondary | ICD-10-CM | POA: Insufficient documentation

## 2019-01-27 DIAGNOSIS — R1310 Dysphagia, unspecified: Secondary | ICD-10-CM | POA: Diagnosis not present

## 2019-01-27 DIAGNOSIS — Z510 Encounter for antineoplastic radiation therapy: Secondary | ICD-10-CM | POA: Diagnosis not present

## 2019-01-27 DIAGNOSIS — Z79899 Other long term (current) drug therapy: Secondary | ICD-10-CM | POA: Diagnosis not present

## 2019-01-27 LAB — CMP (CANCER CENTER ONLY)
ALT: 13 U/L (ref 0–44)
AST: 12 U/L — ABNORMAL LOW (ref 15–41)
Albumin: 3.3 g/dL — ABNORMAL LOW (ref 3.5–5.0)
Alkaline Phosphatase: 81 U/L (ref 38–126)
Anion gap: 9 (ref 5–15)
BUN: 32 mg/dL — ABNORMAL HIGH (ref 8–23)
CO2: 23 mmol/L (ref 22–32)
Calcium: 8 mg/dL — ABNORMAL LOW (ref 8.9–10.3)
Chloride: 107 mmol/L (ref 98–111)
Creatinine: 0.86 mg/dL (ref 0.61–1.24)
GFR, Est AFR Am: >60 mL/min (ref >60)
GFR, Estimated: >60 mL/min (ref >60)
Glucose, Bld: 107 mg/dL — ABNORMAL HIGH (ref 70–99)
Potassium: 3.8 mmol/L (ref 3.5–5.1)
Sodium: 139 mmol/L (ref 135–145)
Total Bilirubin: 0.7 mg/dL (ref 0.3–1.2)
Total Protein: 5.6 g/dL — ABNORMAL LOW (ref 6.5–8.1)

## 2019-01-27 LAB — CBC WITH DIFFERENTIAL (CANCER CENTER ONLY)
Abs Immature Granulocytes: 0.03 10*3/uL (ref 0.00–0.07)
Basophils Absolute: 0.1 10*3/uL (ref 0.0–0.1)
Basophils Relative: 1 %
Eosinophils Absolute: 0 10*3/uL (ref 0.0–0.5)
Eosinophils Relative: 0 %
HCT: 25.7 % — ABNORMAL LOW (ref 39.0–52.0)
Hemoglobin: 8.5 g/dL — ABNORMAL LOW (ref 13.0–17.0)
Immature Granulocytes: 1 %
Lymphocytes Relative: 8 %
Lymphs Abs: 0.3 10*3/uL — ABNORMAL LOW (ref 0.7–4.0)
MCH: 34.4 pg — ABNORMAL HIGH (ref 26.0–34.0)
MCHC: 33.1 g/dL (ref 30.0–36.0)
MCV: 104 fL — ABNORMAL HIGH (ref 80.0–100.0)
Monocytes Absolute: 0.3 10*3/uL (ref 0.1–1.0)
Monocytes Relative: 9 %
Neutro Abs: 3 10*3/uL (ref 1.7–7.7)
Neutrophils Relative %: 81 %
Platelet Count: 128 10*3/uL — ABNORMAL LOW (ref 150–400)
RBC: 2.47 MIL/uL — ABNORMAL LOW (ref 4.22–5.81)
RDW: 16.1 % — ABNORMAL HIGH (ref 11.5–15.5)
WBC Count: 3.7 10*3/uL — ABNORMAL LOW (ref 4.0–10.5)
nRBC: 0 % (ref 0.0–0.2)

## 2019-01-27 MED ORDER — SODIUM CHLORIDE 0.9 % IV SOLN
20.0000 mg | Freq: Once | INTRAVENOUS | Status: AC
  Administered 2019-01-27: 20 mg via INTRAVENOUS
  Filled 2019-01-27: qty 20

## 2019-01-27 MED ORDER — PALONOSETRON HCL INJECTION 0.25 MG/5ML
0.2500 mg | Freq: Once | INTRAVENOUS | Status: AC
  Administered 2019-01-27: 0.25 mg via INTRAVENOUS

## 2019-01-27 MED ORDER — PALONOSETRON HCL INJECTION 0.25 MG/5ML
INTRAVENOUS | Status: AC
  Filled 2019-01-27: qty 5

## 2019-01-27 MED ORDER — SODIUM CHLORIDE 0.9 % IV SOLN
Freq: Once | INTRAVENOUS | Status: AC
  Administered 2019-01-27: 11:00:00 via INTRAVENOUS
  Filled 2019-01-27: qty 250

## 2019-01-27 MED ORDER — DIPHENHYDRAMINE HCL 50 MG/ML IJ SOLN
50.0000 mg | Freq: Once | INTRAMUSCULAR | Status: AC
  Administered 2019-01-27: 50 mg via INTRAVENOUS

## 2019-01-27 MED ORDER — DIPHENHYDRAMINE HCL 50 MG/ML IJ SOLN
INTRAMUSCULAR | Status: AC
  Filled 2019-01-27: qty 1

## 2019-01-27 MED ORDER — SODIUM CHLORIDE 0.9 % IV SOLN
190.0000 mg | Freq: Once | INTRAVENOUS | Status: AC
  Administered 2019-01-27: 190 mg via INTRAVENOUS
  Filled 2019-01-27: qty 19

## 2019-01-27 MED ORDER — SODIUM CHLORIDE 0.9 % IV SOLN
45.0000 mg/m2 | Freq: Once | INTRAVENOUS | Status: AC
  Administered 2019-01-27: 90 mg via INTRAVENOUS
  Filled 2019-01-27: qty 15

## 2019-01-27 MED ORDER — SODIUM CHLORIDE 0.9 % IV SOLN
20.0000 mg | Freq: Once | INTRAVENOUS | Status: AC
  Administered 2019-01-27: 20 mg via INTRAVENOUS
  Filled 2019-01-27: qty 2

## 2019-01-27 NOTE — Progress Notes (Signed)
78 year old male diagnosed with non-small cell lung cancer receiving chemo and radiation therapy.  Past medical history includes legionnaires disease, hypertension, hiatal hernia, GERD, and COPD.  Medications include Protonix, Carafate, lidocaine, multivitamin, Compazine, and vitamin C.  Labs include glucose 107 and albumin 3.3 on March 2.  Height: 68 inches. Weight: 176.5 pounds February 24. Usual body weight: 177 pounds. BMI: 26.84.  Patient reports he does have painful and difficult swallowing secondary to radiation esophagitis.  He reports lidocaine helps immensely. He reports he has increased fatigue and constipation. He is taking a stool softener and reports that it helps. He thinks he is getting adequate fluids.  Nutrition diagnosis:  Food and nutrition related knowledge deficit related to non-small cell lung cancer and associated treatments as evidenced by no prior need for nutrition related information.  Intervention: Educated patient on strategies for improving swallow.   Recommended oral nutrition supplements as needed. Reviewed strategies for increasing hydration. Educated patient on strategies for improving constipation. Fact sheets were provided.  Questions were answered.  Teach back method used.  Contact information given.  Monitoring, evaluation, goals: Patient will tolerate adequate calories and protein to minimize weight loss throughout treatment.  Next visit: To be scheduled as needed.  **Disclaimer: This note was dictated with voice recognition software. Similar sounding words can inadvertently be transcribed and this note may contain transcription errors which may not have been corrected upon publication of note.**

## 2019-01-27 NOTE — Patient Instructions (Signed)
[facility] Discharge Instructions for Patients Receiving Chemotherapy  Today you received the following chemotherapy agents: Paclitaxel (Taxol) and Carboplatin (Paraplatin)  To help prevent nausea and vomiting after your treatment, we encourage you to take your nausea medication as directed.    If you develop nausea and vomiting that is not controlled by your nausea medication, call the clinic.   BELOW ARE SYMPTOMS THAT SHOULD BE REPORTED IMMEDIATELY:  *FEVER GREATER THAN 100.5 F  *CHILLS WITH OR WITHOUT FEVER  NAUSEA AND VOMITING THAT IS NOT CONTROLLED WITH YOUR NAUSEA MEDICATION  *UNUSUAL SHORTNESS OF BREATH  *UNUSUAL BRUISING OR BLEEDING  TENDERNESS IN MOUTH AND THROAT WITH OR WITHOUT PRESENCE OF ULCERS  *URINARY PROBLEMS  *BOWEL PROBLEMS  UNUSUAL RASH Items with * indicate a potential emergency and should be followed up as soon as possible.  Feel free to call the clinic should you have any questions or concerns. The clinic phone number is [phone].  Please show the [facility] at check-in to the Emergency Department and triage nurse.

## 2019-01-28 ENCOUNTER — Ambulatory Visit
Admission: RE | Admit: 2019-01-28 | Discharge: 2019-01-28 | Disposition: A | Discharge status: Home / Self Care | Payer: Medicare HMO | Source: Ambulatory Visit | Attending: Radiation Oncology | Admitting: Radiation Oncology

## 2019-01-28 DIAGNOSIS — C3412 Malignant neoplasm of upper lobe, left bronchus or lung: Secondary | ICD-10-CM | POA: Diagnosis not present

## 2019-01-28 DIAGNOSIS — Z510 Encounter for antineoplastic radiation therapy: Secondary | ICD-10-CM | POA: Diagnosis not present

## 2019-01-29 ENCOUNTER — Ambulatory Visit
Admission: RE | Admit: 2019-01-29 | Discharge: 2019-01-29 | Disposition: A | Discharge status: Home / Self Care | Payer: Medicare HMO | Source: Ambulatory Visit | Attending: Radiation Oncology | Admitting: Radiation Oncology

## 2019-01-29 DIAGNOSIS — Z510 Encounter for antineoplastic radiation therapy: Secondary | ICD-10-CM | POA: Diagnosis not present

## 2019-01-29 DIAGNOSIS — C3412 Malignant neoplasm of upper lobe, left bronchus or lung: Secondary | ICD-10-CM | POA: Diagnosis not present

## 2019-01-30 ENCOUNTER — Ambulatory Visit
Admission: RE | Admit: 2019-01-30 | Discharge: 2019-01-30 | Disposition: A | Discharge status: Home / Self Care | Payer: Medicare HMO | Source: Ambulatory Visit | Attending: Radiation Oncology | Admitting: Radiation Oncology

## 2019-01-30 DIAGNOSIS — C3412 Malignant neoplasm of upper lobe, left bronchus or lung: Secondary | ICD-10-CM | POA: Diagnosis not present

## 2019-01-30 DIAGNOSIS — Z510 Encounter for antineoplastic radiation therapy: Secondary | ICD-10-CM | POA: Diagnosis not present

## 2019-01-31 ENCOUNTER — Other Ambulatory Visit: Payer: Self-pay | Admitting: Radiation Oncology

## 2019-01-31 ENCOUNTER — Ambulatory Visit: Payer: Medicare HMO | Admitting: Pulmonary Disease

## 2019-01-31 ENCOUNTER — Ambulatory Visit
Admission: RE | Admit: 2019-01-31 | Discharge: 2019-01-31 | Disposition: A | Discharge status: Home / Self Care | Payer: Medicare HMO | Source: Ambulatory Visit | Attending: Radiation Oncology | Admitting: Radiation Oncology

## 2019-01-31 DIAGNOSIS — C3412 Malignant neoplasm of upper lobe, left bronchus or lung: Secondary | ICD-10-CM | POA: Diagnosis not present

## 2019-01-31 DIAGNOSIS — Z510 Encounter for antineoplastic radiation therapy: Secondary | ICD-10-CM | POA: Diagnosis not present

## 2019-01-31 MED ORDER — LIDOCAINE VISCOUS HCL 2 % MT SOLN: 15.0000 mL | Freq: Four times a day (QID) | OROMUCOSAL | 1 refills | Status: DC | PRN

## 2019-02-03 ENCOUNTER — Encounter: Payer: Self-pay | Admitting: Internal Medicine

## 2019-02-03 ENCOUNTER — Inpatient Hospital Stay: Payer: Medicare HMO

## 2019-02-03 ENCOUNTER — Other Ambulatory Visit: Payer: Self-pay | Admitting: Medical Oncology

## 2019-02-03 ENCOUNTER — Other Ambulatory Visit: Payer: Self-pay

## 2019-02-03 ENCOUNTER — Telehealth: Payer: Self-pay | Admitting: Internal Medicine

## 2019-02-03 ENCOUNTER — Inpatient Hospital Stay (HOSPITAL_BASED_OUTPATIENT_CLINIC_OR_DEPARTMENT_OTHER): Payer: Medicare HMO | Admitting: Internal Medicine

## 2019-02-03 ENCOUNTER — Ambulatory Visit
Admission: RE | Admit: 2019-02-03 | Discharge: 2019-02-03 | Disposition: A | Discharge status: Home / Self Care | Payer: Medicare HMO | Source: Ambulatory Visit | Attending: Radiation Oncology | Admitting: Radiation Oncology

## 2019-02-03 VITALS — BP 113/53 | HR 100 | Temp 99.1°F | Resp 18 | Ht 68.0 in | Wt 171.7 lb

## 2019-02-03 DIAGNOSIS — Z5111 Encounter for antineoplastic chemotherapy: Secondary | ICD-10-CM

## 2019-02-03 DIAGNOSIS — J449 Chronic obstructive pulmonary disease, unspecified: Secondary | ICD-10-CM

## 2019-02-03 DIAGNOSIS — D649 Anemia, unspecified: Secondary | ICD-10-CM

## 2019-02-03 DIAGNOSIS — C3412 Malignant neoplasm of upper lobe, left bronchus or lung: Secondary | ICD-10-CM | POA: Diagnosis not present

## 2019-02-03 DIAGNOSIS — R5381 Other malaise: Secondary | ICD-10-CM

## 2019-02-03 DIAGNOSIS — C3490 Malignant neoplasm of unspecified part of unspecified bronchus or lung: Secondary | ICD-10-CM

## 2019-02-03 DIAGNOSIS — T451X5A Adverse effect of antineoplastic and immunosuppressive drugs, initial encounter: Secondary | ICD-10-CM | POA: Insufficient documentation

## 2019-02-03 DIAGNOSIS — R1310 Dysphagia, unspecified: Secondary | ICD-10-CM | POA: Diagnosis not present

## 2019-02-03 DIAGNOSIS — Z79899 Other long term (current) drug therapy: Secondary | ICD-10-CM

## 2019-02-03 DIAGNOSIS — D6481 Anemia due to antineoplastic chemotherapy: Secondary | ICD-10-CM

## 2019-02-03 DIAGNOSIS — E876 Hypokalemia: Secondary | ICD-10-CM | POA: Diagnosis not present

## 2019-02-03 DIAGNOSIS — Z510 Encounter for antineoplastic radiation therapy: Secondary | ICD-10-CM | POA: Diagnosis not present

## 2019-02-03 DIAGNOSIS — R5383 Other fatigue: Secondary | ICD-10-CM | POA: Diagnosis not present

## 2019-02-03 DIAGNOSIS — C3492 Malignant neoplasm of unspecified part of left bronchus or lung: Secondary | ICD-10-CM

## 2019-02-03 DIAGNOSIS — M6281 Muscle weakness (generalized): Secondary | ICD-10-CM

## 2019-02-03 DIAGNOSIS — I10 Essential (primary) hypertension: Secondary | ICD-10-CM

## 2019-02-03 DIAGNOSIS — R630 Anorexia: Secondary | ICD-10-CM | POA: Diagnosis not present

## 2019-02-03 DIAGNOSIS — T451X5S Adverse effect of antineoplastic and immunosuppressive drugs, sequela: Secondary | ICD-10-CM | POA: Diagnosis not present

## 2019-02-03 DIAGNOSIS — K219 Gastro-esophageal reflux disease without esophagitis: Secondary | ICD-10-CM

## 2019-02-03 DIAGNOSIS — Z7982 Long term (current) use of aspirin: Secondary | ICD-10-CM

## 2019-02-03 LAB — CBC WITH DIFFERENTIAL (CANCER CENTER ONLY)
Abs Immature Granulocytes: 0.01 10*3/uL (ref 0.00–0.07)
Basophils Absolute: 0 10*3/uL (ref 0.0–0.1)
Basophils Relative: 1 %
Eosinophils Absolute: 0 10*3/uL (ref 0.0–0.5)
Eosinophils Relative: 0 %
HCT: 22.8 % — ABNORMAL LOW (ref 39.0–52.0)
Hemoglobin: 7.6 g/dL — ABNORMAL LOW (ref 13.0–17.0)
Immature Granulocytes: 0 %
Lymphocytes Relative: 8 %
Lymphs Abs: 0.2 10*3/uL — ABNORMAL LOW (ref 0.7–4.0)
MCH: 34.9 pg — ABNORMAL HIGH (ref 26.0–34.0)
MCHC: 33.3 g/dL (ref 30.0–36.0)
MCV: 104.6 fL — ABNORMAL HIGH (ref 80.0–100.0)
Monocytes Absolute: 0.2 10*3/uL (ref 0.1–1.0)
Monocytes Relative: 8 %
Neutro Abs: 1.8 10*3/uL (ref 1.7–7.7)
Neutrophils Relative %: 83 %
Platelet Count: 79 10*3/uL — ABNORMAL LOW (ref 150–400)
RBC: 2.18 MIL/uL — ABNORMAL LOW (ref 4.22–5.81)
RDW: 17.1 % — ABNORMAL HIGH (ref 11.5–15.5)
WBC Count: 2.3 10*3/uL — ABNORMAL LOW (ref 4.0–10.5)
nRBC: 0 % (ref 0.0–0.2)

## 2019-02-03 LAB — COMPREHENSIVE METABOLIC PANEL
ALT: 17 U/L (ref 0–44)
AST: 17 U/L (ref 15–41)
Albumin: 3.4 g/dL — ABNORMAL LOW (ref 3.5–5.0)
Alkaline Phosphatase: 73 U/L (ref 38–126)
Anion gap: 9 (ref 5–15)
BUN: 24 mg/dL — ABNORMAL HIGH (ref 8–23)
CO2: 23 mmol/L (ref 22–32)
Calcium: 8.1 mg/dL — ABNORMAL LOW (ref 8.9–10.3)
Chloride: 109 mmol/L (ref 98–111)
Creatinine, Ser: 0.79 mg/dL (ref 0.61–1.24)
GFR calc Af Amer: >60 mL/min (ref >60)
GFR calc non Af Amer: >60 mL/min (ref >60)
Glucose, Bld: 179 mg/dL — ABNORMAL HIGH (ref 70–99)
Potassium: 2.8 mmol/L — ABNORMAL LOW (ref 3.5–5.1)
Sodium: 141 mmol/L (ref 135–145)
Total Bilirubin: 0.6 mg/dL (ref 0.3–1.2)
Total Protein: 5.5 g/dL — ABNORMAL LOW (ref 6.5–8.1)

## 2019-02-03 LAB — PREPARE RBC (CROSSMATCH)

## 2019-02-03 LAB — ABO/RH: ABO/RH(D): B NEG

## 2019-02-03 MED ORDER — ACETAMINOPHEN 325 MG PO TABS
650.0000 mg | ORAL_TABLET | Freq: Once | ORAL | Status: AC
  Administered 2019-02-03: 650 mg via ORAL

## 2019-02-03 MED ORDER — POTASSIUM CHLORIDE ER 20 MEQ PO TBCR: 20.0000 meq | EXTENDED_RELEASE_TABLET | Freq: Two times a day (BID) | ORAL | 0 refills | Status: DC

## 2019-02-03 MED ORDER — SODIUM CHLORIDE 0.9% IV SOLUTION
250.0000 mL | Freq: Once | INTRAVENOUS | Status: AC
  Administered 2019-02-03: 250 mL via INTRAVENOUS
  Filled 2019-02-03: qty 250

## 2019-02-03 MED ORDER — ACETAMINOPHEN 325 MG PO TABS
ORAL_TABLET | ORAL | Status: AC
  Filled 2019-02-03: qty 2

## 2019-02-03 MED ORDER — DIPHENHYDRAMINE HCL 25 MG PO CAPS
25.0000 mg | ORAL_CAPSULE | Freq: Once | ORAL | Status: AC
  Administered 2019-02-03: 25 mg via ORAL

## 2019-02-03 MED ORDER — DIPHENHYDRAMINE HCL 25 MG PO CAPS
ORAL_CAPSULE | ORAL | Status: AC
  Filled 2019-02-03: qty 1

## 2019-02-03 NOTE — Patient Instructions (Signed)
Blood Transfusion, Adult, Care After This sheet gives you information about how to care for yourself after your procedure. Your doctor may also give you more specific instructions. If you have problems or questions, contact your doctor. Follow these instructions at home:   Take over-the-counter and prescription medicines only as told by your doctor.  Go back to your normal activities as told by your doctor.  Follow instructions from your doctor about how to take care of the area where an IV tube was put into your vein (insertion site). Make sure you: ? Wash your hands with soap and water before you change your bandage (dressing). If there is no soap and water, use hand sanitizer. ? Change your bandage as told by your doctor.  Check your IV insertion site every day for signs of infection. Check for: ? More redness, swelling, or pain. ? More fluid or blood. ? Warmth. ? Pus or a bad smell. Contact a doctor if:  You have more redness, swelling, or pain around the IV insertion site.  You have more fluid or blood coming from the IV insertion site.  Your IV insertion site feels warm to the touch.  You have pus or a bad smell coming from the IV insertion site.  Your pee (urine) turns pink, red, or brown.  You feel weak after doing your normal activities. Get help right away if:  You have signs of a serious allergic or body defense (immune) system reaction, including: ? Itchiness. ? Hives. ? Trouble breathing. ? Anxiety. ? Pain in your chest or lower back. ? Fever, flushing, and chills. ? Fast pulse. ? Rash. ? Watery poop (diarrhea). ? Throwing up (vomiting). ? Dark pee. ? Serious headache. ? Dizziness. ? Stiff neck. ? Yellow color in your face or the white parts of your eyes (jaundice). Summary  After a blood transfusion, return to your normal activities as told by your doctor.  Every day, check for signs of infection where the IV tube was put into your vein.  Some  signs of infection are warm skin, more redness and pain, more fluid or blood, and pus or a bad smell where the needle went in.  Contact your doctor if you feel weak or have any unusual symptoms. This information is not intended to replace advice given to you by your health care provider. Make sure you discuss any questions you have with your health care provider. Document Released: 12/04/2014 Document Revised: 07/07/2016 Document Reviewed: 07/07/2016 Elsevier Interactive Patient Education  2019 [employer]  

## 2019-02-03 NOTE — Progress Notes (Signed)
[facility] [phone]   [phone]  OFFICE PROGRESS NOTE  [name], MD [address] [place] [address]  DIAGNOSIS: Stage IIIB (T1b, N3, M0) non-small cell lung cancer favoring adenocarcinoma diagnosed in January 2020 and presented with left lower lobe pulmonary nodule in addition to bilateral hilar and subcarinal lymphadenopathy.  Molecular studies by [facility]ant 360 showed no actionable mutations.  PRIOR THERAPY: None  CURRENT THERAPY: Concurrent chemoradiation with weekly carboplatin for AUC of 2 and paclitaxel 45 mg/M2. First dose February 4th, 2020. Status post 5 cycles.   INTERVAL HISTORY: Robert Dixon 78 y.o. male returns to the clinic today for follow-up visit accompanied by his daughter.  The patient is feeling fine today with no concerning complaints except for the baseline shortness of breath he is currently on home oxygen.  He is also complaining of fatigue and weakness in addition to odynophagia.  He is currently on Viscous Lidocaine.  He has Carafate but he has not been using it as planned.  The patient denied having any recent weight loss or night sweats.  He has no nausea, vomiting, diarrhea or constipation.  He has been tolerating his treatment with concurrent chemoradiation fairly well except for the odynophagia.  He is expected to complete the course of radiation on February 05, 2019.  He is here today for evaluation before starting cycle #6 of his treatment.  MEDICAL HISTORY: Past Medical History:  Diagnosis Date  . Cancer ([place])    skin-   . Complication of anesthesia   . COPD (chronic obstructive pulmonary disease) ([place])   . Dyspnea   . GERD (gastroesophageal reflux disease)   . Hemoptysis 11/20/2018  . History of hiatal hernia   . Hypertension   . Hypoxia 11/20/2018  . 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 Migraine with visual aura   . Pneumonia 11/20/2018  . PONV (postoperative nausea and  vomiting)   . Pulmonary nodule 11/20/2018    ALLERGIES:  has no active allergies.  MEDICATIONS:  Current Outpatient Medications  Medication Sig Dispense Refill  . albuterol (PROVENTIL HFA;VENTOLIN HFA) 108 (90 Base) MCG/ACT inhaler Inhale 2 puffs into the lungs every 6 (six) hours as needed for wheezing or shortness of breath. 1 Inhaler 6  . amLODipine (NORVASC) 5 MG tablet Take 5 mg by mouth daily.    . benazepril (LOTENSIN) 10 MG tablet Take 10 mg by mouth daily.    Marland Kitchen lidocaine (XYLOCAINE) 2 % solution Use as directed 15 mLs in the mouth or throat every 6 (six) hours as needed for mouth pain. 200 mL 1  . Multiple Vitamin (MULTIVITAMIN WITH MINERALS) TABS tablet Take 1 tablet by mouth daily.    . pantoprazole (PROTONIX) 40 MG tablet Take 40 mg by mouth 2 (two) times daily.    . polycarbophil (FIBERCON) 625 MG tablet Take 625 mg by mouth 2 (two) times daily.     . prochlorperazine (COMPAZINE) 10 MG tablet Take 1 tablet (10 mg total) by mouth every 6 (six) hours as needed for nausea or vomiting. 30 tablet 0  . vitamin C (ASCORBIC ACID) 500 MG tablet Take 500 mg by mouth daily.    Marland Kitchen aspirin 81 MG tablet Take 81 mg by mouth daily.    Marland Kitchen dimenhyDRINATE (DRAMAMINE) 50 MG tablet Take 50 mg by mouth every 8 (eight) hours as needed.    Marland Kitchen HYDROcodone-homatropine (HYCODAN) 5-1.5 MG/5ML syrup Take 5 mLs by mouth every 6 (six)  hours as needed for cough.     . sucralfate (CARAFATE) 1 g tablet Take 1 tablet (1 g total) by mouth 4 (four) times daily. (Patient not taking: Reported on 02/03/2019) 120 tablet 2   No current facility-administered medications for this visit.     SURGICAL HISTORY:  Past Surgical History:  Procedure Laterality Date  . COLONOSCOPY W/ POLYPECTOMY    . TONSILLECTOMY    . VASECTOMY    . VIDEO BRONCHOSCOPY WITH ENDOBRONCHIAL ULTRASOUND N/A 12/06/2018   Procedure: VIDEO BRONCHOSCOPY WITH ENDOBRONCHIAL ULTRASOUND;  Surgeon: Garner Nash, DO;  Location: [facility] OR;  Service: Thoracic;   Laterality: N/A;    REVIEW OF SYSTEMS:  Constitutional: positive for anorexia and fatigue Eyes: negative Ears, nose, mouth, throat, and face: negative Respiratory: positive for dyspnea on exertion Cardiovascular: negative Gastrointestinal: positive for dyspepsia and odynophagia Genitourinary:negative Integument/breast: negative Hematologic/lymphatic: negative Musculoskeletal:positive for muscle weakness Neurological: negative Behavioral/Psych: negative Endocrine: negative Allergic/Immunologic: negative   PHYSICAL EXAMINATION: General appearance: alert, cooperative, fatigued and no distress Head: Normocephalic, without obvious abnormality, atraumatic Neck: no adenopathy, no JVD, supple, symmetrical, trachea midline and thyroid not enlarged, symmetric, no tenderness/mass/nodules Lymph nodes: Cervical, supraclavicular, and axillary nodes normal. Resp: clear to auscultation bilaterally Back: symmetric, no curvature. ROM normal. No CVA tenderness. Cardio: regular rate and rhythm, S1, S2 normal, no murmur, click, rub or gallop GI: soft, non-tender; bowel sounds normal; no masses,  no organomegaly Extremities: extremities normal, atraumatic, no cyanosis or edema Neurologic: Alert and oriented X 3, normal strength and tone. Normal symmetric reflexes. Normal coordination and gait  ECOG PERFORMANCE STATUS: 1 - Symptomatic but completely ambulatory  Blood pressure (!) 113/53, pulse 100, temperature 99.1 F (37.3 C), temperature source Oral, resp. rate 18, height 5\' 8"  (1.727 m), weight 171 lb 11.2 oz (77.9 kg), SpO2 99 %.  LABORATORY DATA: Lab Results  Component Value Date   WBC 2.3 (L) 02/03/2019   HGB 7.6 (L) 02/03/2019   HCT 22.8 (L) 02/03/2019   MCV 104.6 (H) 02/03/2019   PLT 79 (L) 02/03/2019      Chemistry      Component Value Date/Time   NA 141 02/03/2019 0851   K 2.8 (L) 02/03/2019 0851   CL 109 02/03/2019 0851   CO2 23 02/03/2019 0851   BUN 24 (H) 02/03/2019 0851    CREATININE 0.79 02/03/2019 0851   CREATININE 0.86 01/27/2019 0910      Component Value Date/Time   CALCIUM 8.1 (L) 02/03/2019 0851   ALKPHOS 73 02/03/2019 0851   AST 17 02/03/2019 0851   AST 12 (L) 01/27/2019 0910   ALT 17 02/03/2019 0851   ALT 13 01/27/2019 0910   BILITOT 0.6 02/03/2019 0851   BILITOT 0.7 01/27/2019 0910       RADIOGRAPHIC STUDIES: No results found.  ASSESSMENT AND PLAN: This is a very pleasant 78 years old white male diagnosed with stage IIIb non-small cell lung cancer, adenocarcinoma with no actionable mutations and currently undergoing a course of concurrent chemoradiation with weekly carboplatin and paclitaxel status post 5 cycles.  He has been tolerating this treatment well except for increasing fatigue and weakness as well as odynophagia and dysphagia.  He is expected to complete the course of radiotherapy on February 05, 2019. The patient has thrombocytopenia today with platelets count of 79,000.  He does not meet the parameter to proceed with cycle #6 today. I recommended for the patient to cancel his treatment today. I will see him back for follow-up visit in 4  weeks for evaluation after repeating CT scan of the chest for restaging of his disease. For the chemotherapy-induced anemia, we will arrange for the patient to receive 2 units of PRBCs transfusion. For the odynophagia and dysphagia, he will continue his current treatment with viscous lidocaine and Carafate. For the hypokalemia, I will start the patient on potassium chloride 40 mEq p.o. daily for 10 days. The patient was advised to call immediately if she has any concerning symptoms in the interval. The patient voices understanding of current disease status and treatment options and is in agreement with the current care plan.  All questions were answered. The patient knows to call the clinic with any problems, questions or concerns. We can certainly see the patient much sooner if necessary.  Disclaimer:  This note was dictated with voice recognition software. Similar sounding words can inadvertently be transcribed and may not be corrected upon review.

## 2019-02-03 NOTE — Progress Notes (Unsigned)
Pt had a critical potassium of 2.8. Called and RBV to Abelina Bachelor, RN [phone]

## 2019-02-03 NOTE — Telephone Encounter (Signed)
Scheduled appt per 3/09 los - pt to get an  Updated schedule next visit.

## 2019-02-04 ENCOUNTER — Ambulatory Visit
Admission: RE | Admit: 2019-02-04 | Discharge: 2019-02-04 | Disposition: A | Discharge status: Home / Self Care | Payer: Medicare HMO | Source: Ambulatory Visit | Attending: Radiation Oncology | Admitting: Radiation Oncology

## 2019-02-04 DIAGNOSIS — C3412 Malignant neoplasm of upper lobe, left bronchus or lung: Secondary | ICD-10-CM | POA: Diagnosis not present

## 2019-02-04 DIAGNOSIS — Z510 Encounter for antineoplastic radiation therapy: Secondary | ICD-10-CM | POA: Diagnosis not present

## 2019-02-04 LAB — TYPE AND SCREEN
ABO/RH(D): B NEG
Antibody Screen: NEGATIVE
Unit division: 0
Unit division: 0

## 2019-02-04 LAB — BPAM RBC
Blood Product Expiration Date: 202003222359
Blood Product Expiration Date: 202003242359
ISSUE DATE / TIME: 202003091305
ISSUE DATE / TIME: 202003091305
Unit Type and Rh: 1700
Unit Type and Rh: 1700

## 2019-02-05 ENCOUNTER — Ambulatory Visit
Admission: RE | Admit: 2019-02-05 | Discharge: 2019-02-05 | Disposition: A | Discharge status: Home / Self Care | Payer: Medicare HMO | Source: Ambulatory Visit | Attending: Radiation Oncology | Admitting: Radiation Oncology

## 2019-02-05 ENCOUNTER — Encounter: Payer: Self-pay | Admitting: Radiation Oncology

## 2019-02-05 DIAGNOSIS — C3412 Malignant neoplasm of upper lobe, left bronchus or lung: Secondary | ICD-10-CM | POA: Diagnosis not present

## 2019-02-05 DIAGNOSIS — Z510 Encounter for antineoplastic radiation therapy: Secondary | ICD-10-CM | POA: Diagnosis not present

## 2019-02-06 ENCOUNTER — Telehealth: Payer: Self-pay | Admitting: Medical Oncology

## 2019-02-06 NOTE — Telephone Encounter (Signed)
Finished radiation this week so chemo cancelled for next week . KDUR- pt cannot swallow tablets-needs liquid K+ Schedule message sent for f/u after CT scan

## 2019-02-07 ENCOUNTER — Other Ambulatory Visit: Payer: Self-pay | Admitting: Physician Assistant

## 2019-02-07 ENCOUNTER — Ambulatory Visit: Payer: Medicare HMO | Admitting: Pulmonary Disease

## 2019-02-07 DIAGNOSIS — E876 Hypokalemia: Secondary | ICD-10-CM

## 2019-02-07 MED ORDER — POTASSIUM CHLORIDE 20 MEQ PO PACK: 20.0000 meq | PACK | Freq: Two times a day (BID) | ORAL | 0 refills | Status: DC

## 2019-02-07 NOTE — Telephone Encounter (Signed)
Ok. Please change to liquid

## 2019-02-09 ENCOUNTER — Other Ambulatory Visit: Payer: Self-pay | Admitting: Internal Medicine

## 2019-02-10 ENCOUNTER — Inpatient Hospital Stay: Payer: Medicare HMO

## 2019-02-11 ENCOUNTER — Other Ambulatory Visit: Payer: Self-pay | Admitting: Internal Medicine

## 2019-02-17 ENCOUNTER — Other Ambulatory Visit: Payer: Self-pay | Admitting: Internal Medicine

## 2019-02-24 ENCOUNTER — Telehealth: Payer: Self-pay | Admitting: Medical Oncology

## 2019-02-24 NOTE — Telephone Encounter (Signed)
Wants to postpone ct and f/u for 2 weeks.Per Julien Nordmann I lvm for Deanna to have his dad keep lab/CT and f/u appts this month.I talked to pt and told him to keep his appts this coming Friday and Monday

## 2019-02-26 ENCOUNTER — Encounter: Payer: Self-pay | Admitting: General Practice

## 2019-02-26 NOTE — Progress Notes (Signed)
[facility] Team contacted patient to assess for food insecurity and other psychosocial needs during current COVID19 pandemic.  Unable to reach, no answer. Left VM w contact information.   Beverely Pace, [place]

## 2019-02-27 DIAGNOSIS — J439 Emphysema, unspecified: Secondary | ICD-10-CM | POA: Diagnosis not present

## 2019-02-28 ENCOUNTER — Ambulatory Visit (HOSPITAL_COMMUNITY)
Admission: RE | Admit: 2019-02-28 | Discharge: 2019-02-28 | Disposition: A | Discharge status: Home / Self Care | Payer: Medicare HMO | Source: Ambulatory Visit | Attending: Internal Medicine | Admitting: Internal Medicine

## 2019-02-28 ENCOUNTER — Ambulatory Visit: Payer: Medicare HMO | Admitting: Pulmonary Disease

## 2019-02-28 ENCOUNTER — Other Ambulatory Visit: Payer: Self-pay

## 2019-02-28 ENCOUNTER — Encounter (HOSPITAL_COMMUNITY): Payer: Self-pay

## 2019-02-28 DIAGNOSIS — C3490 Malignant neoplasm of unspecified part of unspecified bronchus or lung: Secondary | ICD-10-CM | POA: Diagnosis not present

## 2019-02-28 DIAGNOSIS — C3492 Malignant neoplasm of unspecified part of left bronchus or lung: Secondary | ICD-10-CM | POA: Diagnosis not present

## 2019-02-28 HISTORY — DX: Malignant neoplasm of unspecified part of unspecified bronchus or lung: C34.90

## 2019-02-28 MED ORDER — SODIUM CHLORIDE (PF) 0.9 % IJ SOLN
INTRAMUSCULAR | Status: AC
  Filled 2019-02-28: qty 50

## 2019-02-28 MED ORDER — IOHEXOL 300 MG/ML  SOLN
75.0000 mL | Freq: Once | INTRAMUSCULAR | Status: AC | PRN
  Administered 2019-02-28: 75 mL via INTRAVENOUS

## 2019-03-03 ENCOUNTER — Encounter: Payer: Self-pay | Admitting: Physician Assistant

## 2019-03-03 ENCOUNTER — Inpatient Hospital Stay: Payer: Medicare HMO | Attending: Internal Medicine | Admitting: Physician Assistant

## 2019-03-03 ENCOUNTER — Other Ambulatory Visit: Payer: Self-pay | Admitting: Internal Medicine

## 2019-03-03 DIAGNOSIS — Z79899 Other long term (current) drug therapy: Secondary | ICD-10-CM | POA: Insufficient documentation

## 2019-03-03 DIAGNOSIS — C3432 Malignant neoplasm of lower lobe, left bronchus or lung: Secondary | ICD-10-CM | POA: Insufficient documentation

## 2019-03-03 DIAGNOSIS — Z7982 Long term (current) use of aspirin: Secondary | ICD-10-CM | POA: Insufficient documentation

## 2019-03-03 DIAGNOSIS — C3492 Malignant neoplasm of unspecified part of left bronchus or lung: Secondary | ICD-10-CM

## 2019-03-03 DIAGNOSIS — J449 Chronic obstructive pulmonary disease, unspecified: Secondary | ICD-10-CM

## 2019-03-03 DIAGNOSIS — K219 Gastro-esophageal reflux disease without esophagitis: Secondary | ICD-10-CM | POA: Diagnosis not present

## 2019-03-03 DIAGNOSIS — Z5112 Encounter for antineoplastic immunotherapy: Secondary | ICD-10-CM | POA: Insufficient documentation

## 2019-03-03 DIAGNOSIS — I10 Essential (primary) hypertension: Secondary | ICD-10-CM | POA: Diagnosis not present

## 2019-03-03 DIAGNOSIS — Z7189 Other specified counseling: Secondary | ICD-10-CM

## 2019-03-03 NOTE — Progress Notes (Signed)
[facility] HEMATOLOGY-ONCOLOGY TeleHEALTH VISIT PROGRESS NOTE  Dr. Julien Nordmann and I connected with Robert Dixon on 03/03/19 at  1:00 PM EDT by WebEx visit and verified that we are speaking with the correct person using two identifiers.  I discussed the limitations, risks, security and privacy concerns of performing an evaluation and management service by telemedicine and the availability of in-person appointments. I also discussed with the patient that there may be a patient responsible charge related to this service. The patient expressed understanding and agreed to proceed.   Other persons participating in the visit and their role in the encounter:  1) Dr. Darl Pikes Oncologist 2) Mr. Sobecki son Patient's location: Home Provider's location: [place] [facility]  DIAGNOSIS: Stage IIIB (T1b, N3, M0) non-small cell lung cancer favoring adenocarcinoma diagnosed in January 2020 and presented with left lower lobe pulmonary nodule in addition to bilateral hilar and subcarinal lymphadenopathy.   PRIOR THERAPY: Concurrent chemoradiation with weekly carboplatin for an AUC of 2 and paclitaxel 45 mg/m2. Status post 5 cycles.   CURRENT THERAPY: Consolidation immunotherapy with Imfinzi 10 mg/kg IV every 2 weeks. First dose expected March 25, 2019.   INTERVAL HISTORY: Dr. Julien Nordmann and I connected with the patient Robert Dixon 78 year old male via a Webex visit.  Last month, the patient completed his last cycle of concurrent chemoradiation.  The patient tolerated treatment fairly well except for some weakness/fatigue and odynophagia. During the last visit, the patient was found to have chemotherapy-induced anemia in which he received 2 units of RBCs.  The patient has not noticed an appreciable difference in his energy levels following his transfusion.  The patient also developed odynophagia secondary to radiation. He has had mild improvement in his odynophagia over the course of  the last month and uses viscous lidocaine for his symptoms. The patient states that he has lost "a couple pounds" but denies any significant weight loss. Otherwise, the patient is feeling well today.  He denies any fever, chills, or night sweats.  He continues to endorse his usual baseline shortness of breath for which he is on oxygen.  Denies any chest pain, hemoptysis, or cough.  He denies any nausea, vomiting, diarrhea, or constipation.  Headaches or visual changes.  The patient had a recent CT scan performed and is here to review his scan results and treatment options.  MEDICAL HISTORY: Past Medical History:  Diagnosis Date  . Cancer ([place])    skin-   . Complication of anesthesia   . COPD (chronic obstructive pulmonary disease) ([place])   . Dyspnea   . GERD (gastroesophageal reflux disease)   . Hemoptysis 11/20/2018  . History of hiatal hernia   . Hypertension   . Hypoxia 11/20/2018  . 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 Migraine with visual aura   . Non-small cell lung cancer ([place]) dx'd 11/20/18  . Pneumonia 11/20/2018  . PONV (postoperative nausea and vomiting)   . Pulmonary nodule 11/20/2018    ALLERGIES:  has no active allergies.  MEDICATIONS:  Current Outpatient Medications  Medication Sig Dispense Refill  . albuterol (PROVENTIL HFA;VENTOLIN HFA) 108 (90 Base) MCG/ACT inhaler Inhale 2 puffs into the lungs every 6 (six) hours as needed for wheezing or shortness of breath. 1 Inhaler 6  . amLODipine (NORVASC) 5 MG tablet Take 5 mg by mouth daily.    Marland Kitchen aspirin 81 MG tablet Take 81 mg by mouth daily.    . benazepril (LOTENSIN) 10 MG tablet Take 10  mg by mouth daily.    Marland Kitchen dimenhyDRINATE (DRAMAMINE) 50 MG tablet Take 50 mg by mouth every 8 (eight) hours as needed.    Marland Kitchen HYDROcodone-homatropine (HYCODAN) 5-1.5 MG/5ML syrup Take 5 mLs by mouth every 6 (six) hours as needed for cough.     . lidocaine (XYLOCAINE) 2 % solution Use as directed 15 mLs in the mouth or throat every 6 (six)  hours as needed for mouth pain. 200 mL 1  . Multiple Vitamin (MULTIVITAMIN WITH MINERALS) TABS tablet Take 1 tablet by mouth daily.    . pantoprazole (PROTONIX) 40 MG tablet Take 40 mg by mouth 2 (two) times daily.    . polycarbophil (FIBERCON) 625 MG tablet Take 625 mg by mouth 2 (two) times daily.     . potassium chloride (KLOR-CON) 20 MEQ packet Take 20 mEq by mouth 2 (two) times daily. 20 packet 0  . potassium chloride SA (K-DUR,KLOR-CON) 20 MEQ tablet TAKE ONE TABLET BY MOUTH TWICE A DAY 20 tablet 0  . prochlorperazine (COMPAZINE) 10 MG tablet TAKE ONE TABLET BY MOUTH EVERY 6 HOURS AS NEEDED FOR NAUSEA OR VOMITING 30 tablet 0  . sucralfate (CARAFATE) 1 g tablet Take 1 tablet (1 g total) by mouth 4 (four) times daily. (Patient not taking: Reported on 02/03/2019) 120 tablet 2  . vitamin C (ASCORBIC ACID) 500 MG tablet Take 500 mg by mouth daily.     No current facility-administered medications for this visit.     SURGICAL HISTORY:  Past Surgical History:  Procedure Laterality Date  . COLONOSCOPY W/ POLYPECTOMY    . TONSILLECTOMY    . VASECTOMY    . VIDEO BRONCHOSCOPY WITH ENDOBRONCHIAL ULTRASOUND N/A 12/06/2018   Procedure: VIDEO BRONCHOSCOPY WITH ENDOBRONCHIAL ULTRASOUND;  Surgeon: Garner Nash, DO;  Location: [facility] OR;  Service: Thoracic;  Laterality: N/A;    REVIEW OF SYSTEMS:   Review of Systems  Constitutional: Positive for mild weight loss. Negative for appetite change, chills, fatigue, and fever  HENT: Positive for odynophagia.  Negative for mouth sores, nosebleeds, sore throat Eyes: Negative for eye problems and icterus.  Respiratory: Positive for baseline shortness of breath. Negative for cough, hemoptysis, and wheezing.   Cardiovascular: Negative for chest pain and leg swelling.  Gastrointestinal: Negative for abdominal pain, constipation, diarrhea, nausea and vomiting.  Genitourinary: Negative for bladder incontinence, difficulty urinating, dysuria, frequency and hematuria.    Musculoskeletal: Negative for back pain, gait problem, neck pain and neck stiffness.  Skin: Negative for itching and rash.  Neurological: Negative for dizziness, extremity weakness, gait problem, headaches, light-headedness and seizures.  Hematological: Negative for adenopathy. Does not bruise/bleed easily.  Psychiatric/Behavioral: Negative for confusion, depression and sleep disturbance. The patient is not nervous/anxious.     PHYSICAL EXAMINATION:  There were no vitals taken for this visit.  ECOG PERFORMANCE STATUS: 1 - Symptomatic but completely ambulatory   LABORATORY DATA: Lab Results  Component Value Date   WBC 2.3 (L) 02/03/2019   HGB 7.6 (L) 02/03/2019   HCT 22.8 (L) 02/03/2019   MCV 104.6 (H) 02/03/2019   PLT 79 (L) 02/03/2019      Chemistry      Component Value Date/Time   NA 141 02/03/2019 0851   K 2.8 (L) 02/03/2019 0851   CL 109 02/03/2019 0851   CO2 23 02/03/2019 0851   BUN 24 (H) 02/03/2019 0851   CREATININE 0.79 02/03/2019 0851   CREATININE 0.86 01/27/2019 0910      Component Value Date/Time   CALCIUM  8.1 (L) 02/03/2019 0851   ALKPHOS 73 02/03/2019 0851   AST 17 02/03/2019 0851   AST 12 (L) 01/27/2019 0910   ALT 17 02/03/2019 0851   ALT 13 01/27/2019 0910   BILITOT 0.6 02/03/2019 0851   BILITOT 0.7 01/27/2019 0910       RADIOGRAPHIC STUDIES:  Ct Chest W Contrast  Result Date: 02/28/2019 CLINICAL DATA:  Followup left lung non-small-cell carcinoma. Recently completed chemotherapy and radiation therapy. Restaging. EXAM: CT CHEST WITH CONTRAST TECHNIQUE: Multidetector CT imaging of the chest was performed during intravenous contrast administration. CONTRAST:  54mL OMNIPAQUE IOHEXOL 300 MG/ML  SOLN COMPARISON:  PET-CT on 12/11/2018 FINDINGS: Cardiovascular: No acute findings. Aortic and coronary artery atherosclerosis. Mediastinum/Nodes: Left hilar mass or lymphadenopathy has decreased since previous study, currently measuring 10 mm short axis on image  79/2, compared to 2.3 cm at same location on previous study. Significant decrease in subcarinal mediastinal lymphadenopathy is also seen, currently measuring 12 mm short axis compared to 2.4 cm previously. No new or increased areas of lymphadenopathy identified. Lungs/Pleura: Severe emphysema again seen. Previously seen nodular density in the peripheral left lower lobe has resolved since prior exam. No other suspicious pulmonary nodules or masses identified. No evidence of pleural effusion. Upper Abdomen: Normal adrenal glands. Several bilateral renal and hepatic cysts again noted. Musculoskeletal:  No suspicious bone lesions. IMPRESSION: 1. No new or progressive disease within the thorax. 2. Near complete resolution of left hilar and mediastinal lymphadenopathy. 3. Interval resolution of left lower lobe pulmonary nodule since previous study. Aortic Atherosclerosis (ICD10-I70.0) and Emphysema (ICD10-J43.9). Coronary artery atherosclerosis. Electronically Signed   By: Earle Gell M.D.   On: 02/28/2019 12:19     ASSESSMENT/PLAN:  This is a very pleasant 78 year old Caucasian male diagnosed with stage IIIb non-small cell lung cancer, adenocarcinoma of the left lower lobe.  He was diagnosed in January 2020.  He has no actionable mutations. The patient underwent concurrent chemoradiation with carboplatin for an AUC of 2 and paclitaxel 45 mg/m.  He is status post 5 cycles.  He was unable to proceed with cycle #6 secondary to thrombocytopenia.  The patient underwent a restaging CT scan. Dr. Julien Nordmann personally and independently reviewed the scan and discussed the results with the patient and his son. The scan showed Interval resolution of left lower lobe pulmonary nodule and near complete resolution of left hilar and mediastinal lymphadenopathy.   Dr. Julien Nordmann had a lengthy discussion with the patient today about his current condition and treatment options.  He is given the option of consolidation immunotherapy  with Imfinzi 10 mg/kg IV every 2 weeks for one year unless there is evidence of disease progression or unacceptable toxicity vs. proceeding on observation. The patient is interested in pursuing consolidation immunotherapy. Given the current pandemic, we will start his first treatment in about 2-3 weeks.  He is expected to receive his first dose on March 25, 2019.   Dr. Julien Nordmann discussed with them the adverse effect of the immunotherapy including but not limited to immunotherapy mediated skin rash, diarrhea, inflammation of the lung, kidney, liver, thyroid or other endocrine dysfunction .   I will see the patient back for follow-up visit in about 2 to 3 weeks before starting cycle #1 of consolidation immunotherapy with Imfinzi.  The patient was advised to call immediately if he has any concerning symptoms in the interval. The patient voices understanding of current disease status and treatment options and is in agreement with the current care plan. The patient knows  to call the clinic with any problems, questions or concerns. We can certainly see the patient much sooner if necessary  The patient was provided an opportunity to ask questions and all were answered. The patient agreed with the plan and demonstrated an understanding of the instructions.  The patient was advised to call back or seek an in-person evaluation if the symptoms worsen or if the condition fails to improve as anticipated.    I provided 25 minutes of face-to-face video visit time during this encounter, and > 50% was spent counseling as documented under my assessment & plan.   Jordann Grime L Shateka Petrea, PA-C 03/03/2019 2:09 PM  Orders Placed This Encounter  Procedures  . CBC with Differential (Cancer Center Only)    Standing Status:   Standing    Number of Occurrences:   26    Standing Expiration Date:   03/02/2020  . TSH    Standing Status:   Standing    Number of Occurrences:   13    Standing Expiration Date:   03/02/2020  .  CMP ([facility] only)    Standing Status:   Standing    Number of Occurrences:   26    Standing Expiration Date:   03/02/2020     Airon Sahni L Shigeko Manard, PA-C 03/03/19 ADDENDUM: Hematology/Oncology Attending: I had a face-to-face encounter with the patient via WebEx visual video meeting.  I recommended his care plan.  The patient was accompanied by his son.  He is a very pleasant 30 years old white male with history of a stage IIIb non-small cell lung cancer, adenocarcinoma status post a course of concurrent chemoradiation with weekly carboplatin and paclitaxel.  The patient tolerated the previous course of his treatment well except for mild dysphasia as well as fatigue and shortness of breath with exertion. He had repeat CT scan of the chest performed recently.  I personally and independently reviewed the scan images and discussed the result and shared the images of the scans to the patient and his son. His scan showed significant improvement in his disease. I discussed with the patient his treatment options including continuous observation and monitoring versus consideration of consolidation treatment with immunotherapy with Imfinzi 10 mg/KG every 2 weeks for a total of one year if the patient has no evidence for disease progression or unacceptable toxicity. I discussed with the patient the adverse effect of the immunotherapy including but not limited to immunotherapy mediated skin rash, diarrhea, inflammation of the lung, kidney, liver, thyroid or other endocrine dysfunction. The patient is interested in proceeding with the immunotherapy. I will arrange for him to receive the first cycle of this treatment in 3 weeks hopefully after improvement of the COVID19 pandemic situation. The patient will come back for follow-up visit at that time. He was advised to call immediately if he has any concerning symptoms in the interval.  Disclaimer: This note was dictated with voice recognition software.  Similar sounding words can inadvertently be transcribed and may be missed upon review. Eilleen Kempf, MD 03/03/19

## 2019-03-04 ENCOUNTER — Telehealth: Payer: Self-pay | Admitting: Physician Assistant

## 2019-03-04 NOTE — Telephone Encounter (Signed)
Called regarding 4/28

## 2019-03-06 ENCOUNTER — Telehealth: Payer: Self-pay | Admitting: *Deleted

## 2019-03-06 ENCOUNTER — Telehealth: Payer: Self-pay | Admitting: Physician Assistant

## 2019-03-06 NOTE — Telephone Encounter (Signed)
Spoke to the patient's daughter in law regarding a few follow-up questions from his [facility] visit earlier this week. She inquired about delaying the start date of his immunotherapy. I discussed with her that we ideally start immunotherapy within 6 weeks of completion of chemoradiation. Also clarified that treatment is one day every 2 weeks. She also inquired about a port-a-cath placement in the future. I discussed that I would be happy to arrange a referral for a port-a-cath placement once the restrictions on elective procedures is lifted. All questions were answered. She knows she can call the clinic with any more questions or concerns.

## 2019-03-06 NOTE — Telephone Encounter (Signed)
Received call from pt's daughter-in-law, Robert Dixon. She is requesting a call back from [facility], [facility] to clarify what was said in last Telehealth visit with the patient.  She states the connection was rough at times and the patient is having a hard time remembering what was said.  It is ok to call in the next few days.

## 2019-03-10 ENCOUNTER — Telehealth: Payer: Self-pay | Admitting: Medical Oncology

## 2019-03-10 ENCOUNTER — Other Ambulatory Visit: Payer: Self-pay | Admitting: Physician Assistant

## 2019-03-10 ENCOUNTER — Encounter: Payer: Self-pay | Admitting: Internal Medicine

## 2019-03-10 ENCOUNTER — Other Ambulatory Visit: Payer: Self-pay | Admitting: Medical Oncology

## 2019-03-10 DIAGNOSIS — C3492 Malignant neoplasm of unspecified part of left bronchus or lung: Secondary | ICD-10-CM

## 2019-03-10 DIAGNOSIS — Z95828 Presence of other vascular implants and grafts: Secondary | ICD-10-CM

## 2019-03-10 DIAGNOSIS — Z5112 Encounter for antineoplastic immunotherapy: Secondary | ICD-10-CM

## 2019-03-10 MED ORDER — LIDOCAINE-PRILOCAINE 2.5-2.5 % EX CREA: 1.0000 "application " | TOPICAL_CREAM | CUTANEOUS | 0 refills | Status: DC | PRN

## 2019-03-10 NOTE — Telephone Encounter (Signed)
I reiterated  to pt about starting immunotherapy within  the  6 week window of completing xrt and chemotherapy . He will contact dtr in law as he understood he could  wait until June to start immunotherapy.

## 2019-03-10 NOTE — Telephone Encounter (Signed)
Deanna aware of need for pt to start immunotherapy 4/28 and get a port a cath prior.

## 2019-03-11 ENCOUNTER — Telehealth: Payer: Self-pay | Admitting: Medical Oncology

## 2019-03-11 ENCOUNTER — Telehealth (HOSPITAL_COMMUNITY): Payer: Self-pay | Admitting: Radiology

## 2019-03-11 NOTE — Telephone Encounter (Signed)
Requested port a cath procedure at [facility] . Delsa Sale will call pt family member to set up appt.

## 2019-03-11 NOTE — Telephone Encounter (Signed)
Called pt's caregiver, Deanna and left VM for her to call me to schedule pt's port placement. JM

## 2019-03-13 ENCOUNTER — Other Ambulatory Visit: Payer: Self-pay | Admitting: Radiology

## 2019-03-13 NOTE — Progress Notes (Signed)
  Radiation Oncology         [phone] ________________________________  Name: Robert Dixon MRN: [id number]  Date: 02/05/2019  DOB: 04-07-1941  End of Treatment Note  Diagnosis:  Lung cancer     Indication for treatment::  curative       Radiation treatment dates:   12/23/18 - 02/05/19  Site/dose:   The patient was treated to the disease within the left lung initially to a dose of 60 Gy using a 5 field, 3-D conformal technique. The patient then received a cone down boost treatment for an additional 6 Gy. This yielded a final total dose of 66 Gy.   Narrative: The patient tolerated radiation treatment relatively well.   The patient did experience esophagitis during the course of treatment which required management.   Plan: The patient has completed radiation treatment. The patient will return to radiation oncology clinic for routine followup in one month. I advised the patient to call or return sooner if they have any questions or concerns related to their recovery or treatment. ________________________________  Jodelle Gross, M.D., Ph.D.

## 2019-03-14 ENCOUNTER — Other Ambulatory Visit: Payer: Self-pay

## 2019-03-14 ENCOUNTER — Encounter (HOSPITAL_COMMUNITY): Payer: Self-pay

## 2019-03-14 ENCOUNTER — Ambulatory Visit (HOSPITAL_COMMUNITY)
Admission: RE | Admit: 2019-03-14 | Discharge: 2019-03-14 | Disposition: A | Discharge status: Home / Self Care | Payer: Medicare HMO | Source: Ambulatory Visit | Attending: Physician Assistant | Admitting: Physician Assistant

## 2019-03-14 DIAGNOSIS — Z9221 Personal history of antineoplastic chemotherapy: Secondary | ICD-10-CM | POA: Diagnosis not present

## 2019-03-14 DIAGNOSIS — Z87891 Personal history of nicotine dependence: Secondary | ICD-10-CM | POA: Insufficient documentation

## 2019-03-14 DIAGNOSIS — K219 Gastro-esophageal reflux disease without esophagitis: Secondary | ICD-10-CM | POA: Insufficient documentation

## 2019-03-14 DIAGNOSIS — I10 Essential (primary) hypertension: Secondary | ICD-10-CM | POA: Insufficient documentation

## 2019-03-14 DIAGNOSIS — C3490 Malignant neoplasm of unspecified part of unspecified bronchus or lung: Secondary | ICD-10-CM | POA: Diagnosis not present

## 2019-03-14 DIAGNOSIS — Z79899 Other long term (current) drug therapy: Secondary | ICD-10-CM | POA: Insufficient documentation

## 2019-03-14 DIAGNOSIS — J449 Chronic obstructive pulmonary disease, unspecified: Secondary | ICD-10-CM | POA: Insufficient documentation

## 2019-03-14 DIAGNOSIS — C3492 Malignant neoplasm of unspecified part of left bronchus or lung: Secondary | ICD-10-CM

## 2019-03-14 HISTORY — PX: IR IMAGING GUIDED PORT INSERTION: IMG5740

## 2019-03-14 LAB — CBC
HCT: 37.9 % — ABNORMAL LOW (ref 39.0–52.0)
Hemoglobin: 12.1 g/dL — ABNORMAL LOW (ref 13.0–17.0)
MCH: 34.1 pg — ABNORMAL HIGH (ref 26.0–34.0)
MCHC: 31.9 g/dL (ref 30.0–36.0)
MCV: 106.8 fL — ABNORMAL HIGH (ref 80.0–100.0)
Platelets: 262 10*3/uL (ref 150–400)
RBC: 3.55 MIL/uL — ABNORMAL LOW (ref 4.22–5.81)
RDW: 16.3 % — ABNORMAL HIGH (ref 11.5–15.5)
WBC: 6.5 10*3/uL (ref 4.0–10.5)
nRBC: 0 % (ref 0.0–0.2)

## 2019-03-14 LAB — BASIC METABOLIC PANEL
Anion gap: 10 (ref 5–15)
BUN: 15 mg/dL (ref 8–23)
CO2: 23 mmol/L (ref 22–32)
Calcium: 9.1 mg/dL (ref 8.9–10.3)
Chloride: 109 mmol/L (ref 98–111)
Creatinine, Ser: 0.93 mg/dL (ref 0.61–1.24)
GFR calc Af Amer: >60 mL/min (ref >60)
GFR calc non Af Amer: >60 mL/min (ref >60)
Glucose, Bld: 96 mg/dL (ref 70–99)
Potassium: 3.4 mmol/L — ABNORMAL LOW (ref 3.5–5.1)
Sodium: 142 mmol/L (ref 135–145)

## 2019-03-14 LAB — PROTIME-INR
INR: 1 (ref 0.8–1.2)
Prothrombin Time: 12.9 seconds (ref 11.4–15.2)

## 2019-03-14 MED ORDER — FENTANYL CITRATE (PF) 100 MCG/2ML IJ SOLN
INTRAMUSCULAR | Status: AC
  Filled 2019-03-14: qty 2

## 2019-03-14 MED ORDER — MIDAZOLAM HCL 2 MG/2ML IJ SOLN
INTRAMUSCULAR | Status: AC
  Filled 2019-03-14: qty 2

## 2019-03-14 MED ORDER — HEPARIN SOD (PORK) LOCK FLUSH 100 UNIT/ML IV SOLN
INTRAVENOUS | Status: AC
  Administered 2019-03-14: 500 [IU]
  Filled 2019-03-14: qty 5

## 2019-03-14 MED ORDER — LIDOCAINE HCL 1 % IJ SOLN
INTRAMUSCULAR | Status: AC
  Filled 2019-03-14: qty 20

## 2019-03-14 MED ORDER — LIDOCAINE-EPINEPHRINE (PF) 1 %-1:200000 IJ SOLN
INTRAMUSCULAR | Status: AC
  Filled 2019-03-14: qty 30

## 2019-03-14 MED ORDER — MIDAZOLAM HCL 2 MG/2ML IJ SOLN
INTRAMUSCULAR | Status: AC | PRN
  Administered 2019-03-14: 1 mg via INTRAVENOUS
  Administered 2019-03-14 (×2): 0.5 mg via INTRAVENOUS

## 2019-03-14 MED ORDER — LIDOCAINE HCL 1 % IJ SOLN
INTRAMUSCULAR | Status: AC | PRN
  Administered 2019-03-14: 10 mL

## 2019-03-14 MED ORDER — FENTANYL CITRATE (PF) 100 MCG/2ML IJ SOLN
INTRAMUSCULAR | Status: AC | PRN
  Administered 2019-03-14 (×3): 25 ug via INTRAVENOUS

## 2019-03-14 MED ORDER — SODIUM CHLORIDE 0.9 % IV SOLN: INTRAVENOUS | Status: DC

## 2019-03-14 MED ORDER — LIDOCAINE-EPINEPHRINE (PF) 1 %-1:200000 IJ SOLN
INTRAMUSCULAR | Status: AC | PRN
  Administered 2019-03-14: 10 mL

## 2019-03-14 MED ORDER — CEFAZOLIN SODIUM-DEXTROSE 2-4 GM/100ML-% IV SOLN
2.0000 g | INTRAVENOUS | Status: AC
  Administered 2019-03-14: 2 g via INTRAVENOUS

## 2019-03-14 MED ORDER — CEFAZOLIN SODIUM-DEXTROSE 2-4 GM/100ML-% IV SOLN
INTRAVENOUS | Status: AC
  Filled 2019-03-14: qty 100

## 2019-03-14 NOTE — Progress Notes (Signed)
Spoke with daughter in law, Tilda Burrow, over phone and gave discharge instructions.  No questions at this time and Deanna verbalized understanding of instructions.

## 2019-03-14 NOTE — Consult Note (Signed)
Chief Complaint: Patient was seen in consultation today for [facility] a cath at the request of [place]  Referring Physician(s): Dr Mayme Genta  Supervising Physician: Markus Daft  Patient Status: [facility] - Out-pt  History of Present Illness: Robert Dixon is a 78 y.o. male   Newly diagnosed [place] lung cancer Completed chemotherapy Scheduled to begin consolidation immunotherapy 03/23/19 Will place [place] a cath today per Dr Julien Nordmann request  Past Medical History:  Diagnosis Date  . Cancer ([place])    skin-   . Complication of anesthesia   . COPD (chronic obstructive pulmonary disease) ([place])   . Dyspnea   . GERD (gastroesophageal reflux disease)   . Hemoptysis 11/20/2018  . History of hiatal hernia   . Hypertension   . Hypoxia 11/20/2018  . 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 Migraine with visual aura   . Non-small cell lung cancer ([place]) dx'd 11/20/18  . Pneumonia 11/20/2018  . PONV (postoperative nausea and vomiting)   . Pulmonary nodule 11/20/2018    Past Surgical History:  Procedure Laterality Date  . COLONOSCOPY W/ POLYPECTOMY    . TONSILLECTOMY    . VASECTOMY    . VIDEO BRONCHOSCOPY WITH ENDOBRONCHIAL ULTRASOUND N/A 12/06/2018   Procedure: VIDEO BRONCHOSCOPY WITH ENDOBRONCHIAL ULTRASOUND;  Surgeon: Garner Nash, DO;  Location: [facility] OR;  Service: Thoracic;  Laterality: N/A;    Allergies: Patient has no known allergies.  Medications: Prior to Admission medications   Medication Sig Start Date End Date Taking? Authorizing Provider  albuterol (PROVENTIL HFA;VENTOLIN HFA) 108 (90 Base) MCG/ACT inhaler Inhale 2 puffs into the lungs every 6 (six) hours as needed for wheezing or shortness of breath. 12/05/18  Yes Icard, Bradley L, DO  amLODipine (NORVASC) 10 MG tablet Take 5 mg by mouth daily.    Yes [provider]  benazepril (LOTENSIN) 10 MG tablet Take 10 mg by mouth daily.   Yes [provider]  dimenhyDRINATE (DRAMAMINE) 50 MG tablet Take  50 mg by mouth every 8 (eight) hours as needed for dizziness.    Yes [provider]  pantoprazole (PROTONIX) 40 MG tablet Take 40 mg by mouth 2 (two) times daily.   Yes [provider]  prochlorperazine (COMPAZINE) 10 MG tablet TAKE ONE TABLET BY MOUTH EVERY 6 HOURS AS NEEDED FOR NAUSEA OR VOMITING Patient taking differently: Take 10 mg by mouth every 6 (six) hours as needed for nausea or vomiting.  02/11/19  Yes [name], MD  HYDROcodone-homatropine [facility]) 5-1.5 MG/5ML syrup Take 5 mLs by mouth every 6 (six) hours as needed for cough.  11/25/18   [provider]  lidocaine-prilocaine (EMLA) cream Apply 1 application topically as needed. 03/10/19   [name], MD     Family History  Problem Relation Age of Onset  . Cancer - Lung Mother   . Hypertension Father   . CVA Father   . Cancer - Cervical Sister     Social History   Socioeconomic History  . Marital status: Widowed    Spouse name: Not on file  . Number of children: Not on file  . Years of education: Not on file  . Highest education level: Not on file  Occupational History  . Not on file  Social Needs  . Financial resource strain: Not on file  . Food insecurity:    Worry: Not on file    Inability: Not on file  . Transportation needs:    Medical: No    Non-medical: No  Tobacco Use  . Smoking status: Former Smoker    Years: 30.00    Types: Cigarettes    Last attempt to quit: 2000    Years since quitting: 20.3  . Smokeless tobacco: Never Used  Substance and Sexual Activity  . Alcohol use: No  . Drug use: No  . Sexual activity: Not on file  Lifestyle  . Physical activity:    Days per week: Not on file    Minutes per session: Not on file  . Stress: Not on file  Relationships  . Social connections:    Talks on phone: Not on file    Gets together: Not on file    Attends religious service: Not on file    Active member of club or organization: Not on file    Attends  meetings of clubs or organizations: Not on file    Relationship status: Not on file  Other Topics Concern  . Not on file  Social History Narrative  . Not on file    Review of Systems: A 12 point ROS discussed and pertinent positives are indicated in the HPI above.  All other systems are negative.  Review of Systems  Constitutional: Positive for fatigue. Negative for activity change and fever.  Respiratory: Negative for shortness of breath.   Cardiovascular: Negative for chest pain.  Gastrointestinal: Negative for abdominal pain.  Neurological: Positive for weakness.  Psychiatric/Behavioral: Negative for behavioral problems and confusion.    Vital Signs: BP 103/63   Pulse 92   Temp (!) 97.4 F (36.3 C)   Ht 5\' 8"  (1.727 m)   Wt 164 lb (74.4 kg)   SpO2 98%   BMI 24.94 kg/m   Physical Exam Vitals signs reviewed.  Cardiovascular:     Rate and Rhythm: Normal rate and regular rhythm.     Heart sounds: Normal heart sounds.  Pulmonary:     Breath sounds: Normal breath sounds.  Abdominal:     Tenderness: There is no abdominal tenderness.  Musculoskeletal: Normal range of motion.  Skin:    General: Skin is warm and dry.  Neurological:     Mental Status: He is alert and oriented to person, place, and time.  Psychiatric:        Mood and Affect: Mood normal.        Behavior: Behavior normal.        Thought Content: Thought content normal.        Judgment: Judgment normal.     Imaging: Ct Chest W Contrast  Result Date: 02/28/2019 CLINICAL DATA:  Followup left lung non-small-cell carcinoma. Recently completed chemotherapy and radiation therapy. Restaging. EXAM: CT CHEST WITH CONTRAST TECHNIQUE: Multidetector CT imaging of the chest was performed during intravenous contrast administration. CONTRAST:  23mL OMNIPAQUE IOHEXOL 300 MG/ML  SOLN COMPARISON:  PET-CT on 12/11/2018 FINDINGS: Cardiovascular: No acute findings. Aortic and coronary artery atherosclerosis. Mediastinum/Nodes:  Left hilar mass or lymphadenopathy has decreased since previous study, currently measuring 10 mm short axis on image 79/2, compared to 2.3 cm at same location on previous study. Significant decrease in subcarinal mediastinal lymphadenopathy is also seen, currently measuring 12 mm short axis compared to 2.4 cm previously. No new or increased areas of lymphadenopathy identified. Lungs/Pleura: Severe emphysema again seen. Previously seen nodular density in the peripheral left lower lobe has resolved since prior exam. No other suspicious pulmonary nodules or masses identified. No evidence of pleural effusion. Upper Abdomen: Normal adrenal glands. Several bilateral renal and hepatic cysts again noted. Musculoskeletal:  No suspicious bone lesions. IMPRESSION: 1. No new or progressive disease within the thorax. 2. Near complete resolution of left hilar and mediastinal lymphadenopathy. 3. Interval resolution of left lower lobe pulmonary nodule since previous study. Aortic Atherosclerosis (ICD10-I70.0) and Emphysema (ICD10-J43.9). Coronary artery atherosclerosis. Electronically Signed   By: Earle Gell M.D.   On: 02/28/2019 12:19    Labs:  CBC: Recent Labs    01/20/19 1218 01/27/19 0910 02/03/19 0851 03/14/19 0929  WBC 6.0 3.7* 2.3* 6.5  HGB 9.3* 8.5* 7.6* 12.1*  HCT 28.7* 25.7* 22.8* 37.9*  PLT 172 128* 79* 262    COAGS: Recent Labs    12/05/18 1701 03/14/19 0929  INR 0.9 1.0  APTT 31  --     BMP: Recent Labs    01/13/19 1218 01/20/19 1218 01/27/19 0910 02/03/19 0851  NA 137 139 139 141  K 4.3 4.4 3.8 2.8*  CL 107 107 107 109  CO2 21* 23 23 23   GLUCOSE 103* 99 107* 179*  BUN 20 22 32* 24*  CALCIUM 8.0* 8.7* 8.0* 8.1*  CREATININE 0.87 0.86 0.86 0.79  GFRNONAA >60 >60 >60 >60  GFRAA >60 >60 >60 >60    LIVER FUNCTION TESTS: Recent Labs    01/13/19 1218 01/20/19 1218 01/27/19 0910 02/03/19 0851  BILITOT 0.4 0.7 0.7 0.6  AST 12* 13* 12* 17  ALT 15 15 13 17   ALKPHOS 78 79 81  73  PROT 6.0* 6.2* 5.6* 5.5*  ALBUMIN 3.6 3.7 3.3* 3.4*    TUMOR MARKERS: No results for input(s): AFPTM, CEA, CA199, CHROMGRNA in the last 8760 hours.  Assessment and Plan:  [place] lung cancer Completed chemo Now to start consolidation immunotherapy 4/26 Scheduled for PAC placement today Risks and benefits of image guided port-a-catheter placement was discussed with the patient including, but not limited to bleeding, infection, pneumothorax, or fibrin sheath development and need for additional procedures.  All of the patient's questions were answered, patient is agreeable to proceed. Consent signed and in chart.  Thank you for this interesting consult.  I greatly enjoyed meeting WILBERTH DAMON and look forward to participating in their care.  A copy of this report was sent to the requesting provider on this date.  Electronically Signed: Lavonia Drafts, PA-C 03/14/2019, 10:28 AM   I spent a total of  30 Minutes   in face to face in clinical consultation, greater than 50% of which was counseling/coordinating care for [facility] placement

## 2019-03-14 NOTE — Procedures (Signed)
Interventional Radiology Procedure:   Indications: Lung cancer  Procedure: Port placement  Findings: Right jugular port, tip at SVC/RA junction  Complications: None     EBL: Minimal, less than 10 ml  Plan: Discharge in one hour.  Keep port site and incisions dry for at least 24 hours.     Kavion Mancinas R. Anselm Pancoast, MD  Pager: [phone]

## 2019-03-14 NOTE — Discharge Instructions (Signed)
Implanted Port Insertion, Care After  This sheet gives you information about how to care for yourself after your procedure. Your health care provider may also give you more specific instructions. If you have problems or questions, contact your health care provider.  What can I expect after the procedure?  After the procedure, it is common to have:  · Discomfort at the port insertion site.  · Bruising on the skin over the port. This should improve over 3-4 days.  Follow these instructions at home:  Port care  · After your port is placed, you will get a manufacturer's information card. The card has information about your port. Keep this card with you at all times.  · Take care of the port as told by your health care provider. Ask your health care provider if you or a family member can get training for taking care of the port at home. A home health care nurse may also take care of the port.  · Make sure to remember what type of port you have.  Incision care         · Follow instructions from your health care provider about how to take care of your port insertion site. Make sure you:  ? Wash your hands with soap and water before and after you change your bandage (dressing). If soap and water are not available, use hand sanitizer.  ? Change your dressing as told by your health care provider.  ? Leave stitches (sutures), skin glue, or adhesive strips in place. These skin closures may need to stay in place for 2 weeks or longer. If adhesive strip edges start to loosen and curl up, you may trim the loose edges. Do not remove adhesive strips completely unless your health care provider tells you to do that.  · Check your port insertion site every day for signs of infection. Check for:  ? Redness, swelling, or pain.  ? Fluid or blood.  ? Warmth.  ? Pus or a bad smell.  Activity  · Return to your normal activities as told by your health care provider. Ask your health care provider what activities are safe for you.  · Do not  lift anything that is heavier than 10 lb (4.5 kg), or the limit that you are told, until your health care provider says that it is safe.  General instructions  · Take over-the-counter and prescription medicines only as told by your health care provider.  · Do not take baths, swim, or use a hot tub until your health care provider approves. Ask your health care provider if you may take showers. You may only be allowed to take sponge baths.  · Do not drive for 24 hours if you were given a sedative during your procedure.  · Wear a medical alert bracelet in case of an emergency. This will tell any health care providers that you have a port.  · Keep all follow-up visits as told by your health care provider. This is important.  Contact a health care provider if:  · You cannot flush your port with saline as directed, or you cannot draw blood from the port.  · You have a fever or chills.  · You have redness, swelling, or pain around your port insertion site.  · You have fluid or blood coming from your port insertion site.  · Your port insertion site feels warm to the touch.  · You have pus or a bad smell coming from the port   insertion site.  Get help right away if:  · You have chest pain or shortness of breath.  · You have bleeding from your port that you cannot control.  Summary  · Take care of the port as told by your health care provider. Keep the manufacturer's information card with you at all times.  · Change your dressing as told by your health care provider.  · Contact a health care provider if you have a fever or chills or if you have redness, swelling, or pain around your port insertion site.  · Keep all follow-up visits as told by your health care provider.  This information is not intended to replace advice given to you by your health care provider. Make sure you discuss any questions you have with your health care provider.  Document Released: 09/03/2013 Document Revised: 06/11/2018 Document Reviewed:  06/11/2018  [employer]vier Interactive Patient Education © 2019 [employer]

## 2019-03-21 ENCOUNTER — Encounter: Payer: Self-pay | Admitting: Internal Medicine

## 2019-03-25 ENCOUNTER — Encounter: Payer: Self-pay | Admitting: Physician Assistant

## 2019-03-25 ENCOUNTER — Inpatient Hospital Stay: Payer: Medicare HMO

## 2019-03-25 ENCOUNTER — Other Ambulatory Visit: Payer: Self-pay

## 2019-03-25 ENCOUNTER — Inpatient Hospital Stay (HOSPITAL_BASED_OUTPATIENT_CLINIC_OR_DEPARTMENT_OTHER): Payer: Medicare HMO | Admitting: Physician Assistant

## 2019-03-25 VITALS — BP 112/57 | HR 86 | Temp 98.0°F | Resp 18 | Ht 68.0 in | Wt 165.6 lb

## 2019-03-25 DIAGNOSIS — J449 Chronic obstructive pulmonary disease, unspecified: Secondary | ICD-10-CM

## 2019-03-25 DIAGNOSIS — Z79899 Other long term (current) drug therapy: Secondary | ICD-10-CM

## 2019-03-25 DIAGNOSIS — Z7982 Long term (current) use of aspirin: Secondary | ICD-10-CM

## 2019-03-25 DIAGNOSIS — I10 Essential (primary) hypertension: Secondary | ICD-10-CM

## 2019-03-25 DIAGNOSIS — Z95828 Presence of other vascular implants and grafts: Secondary | ICD-10-CM

## 2019-03-25 DIAGNOSIS — K219 Gastro-esophageal reflux disease without esophagitis: Secondary | ICD-10-CM

## 2019-03-25 DIAGNOSIS — C3432 Malignant neoplasm of lower lobe, left bronchus or lung: Secondary | ICD-10-CM | POA: Diagnosis not present

## 2019-03-25 DIAGNOSIS — C3492 Malignant neoplasm of unspecified part of left bronchus or lung: Secondary | ICD-10-CM

## 2019-03-25 DIAGNOSIS — Z5112 Encounter for antineoplastic immunotherapy: Secondary | ICD-10-CM

## 2019-03-25 LAB — CBC WITH DIFFERENTIAL (CANCER CENTER ONLY)
Abs Immature Granulocytes: 0.01 10*3/uL (ref 0.00–0.07)
Basophils Absolute: 0.1 10*3/uL (ref 0.0–0.1)
Basophils Relative: 2 %
Eosinophils Absolute: 0.1 10*3/uL (ref 0.0–0.5)
Eosinophils Relative: 2 %
HCT: 34.2 % — ABNORMAL LOW (ref 39.0–52.0)
Hemoglobin: 11.3 g/dL — ABNORMAL LOW (ref 13.0–17.0)
Immature Granulocytes: 0 %
Lymphocytes Relative: 18 %
Lymphs Abs: 0.9 10*3/uL (ref 0.7–4.0)
MCH: 35.1 pg — ABNORMAL HIGH (ref 26.0–34.0)
MCHC: 33 g/dL (ref 30.0–36.0)
MCV: 106.2 fL — ABNORMAL HIGH (ref 80.0–100.0)
Monocytes Absolute: 0.7 10*3/uL (ref 0.1–1.0)
Monocytes Relative: 13 %
Neutro Abs: 3.3 10*3/uL (ref 1.7–7.7)
Neutrophils Relative %: 65 %
Platelet Count: 207 10*3/uL (ref 150–400)
RBC: 3.22 MIL/uL — ABNORMAL LOW (ref 4.22–5.81)
RDW: 14.9 % (ref 11.5–15.5)
WBC Count: 5.1 10*3/uL (ref 4.0–10.5)
nRBC: 0 % (ref 0.0–0.2)

## 2019-03-25 LAB — TSH: TSH: 1.609 u[IU]/mL (ref 0.320–4.118)

## 2019-03-25 LAB — CMP (CANCER CENTER ONLY)
ALT: 12 U/L (ref 0–44)
AST: 15 U/L (ref 15–41)
Albumin: 3.7 g/dL (ref 3.5–5.0)
Alkaline Phosphatase: 94 U/L (ref 38–126)
Anion gap: 10 (ref 5–15)
BUN: 14 mg/dL (ref 8–23)
CO2: 24 mmol/L (ref 22–32)
Calcium: 8.4 mg/dL — ABNORMAL LOW (ref 8.9–10.3)
Chloride: 107 mmol/L (ref 98–111)
Creatinine: 0.86 mg/dL (ref 0.61–1.24)
GFR, Est AFR Am: >60 mL/min (ref >60)
GFR, Estimated: >60 mL/min (ref >60)
Glucose, Bld: 91 mg/dL (ref 70–99)
Potassium: 4.1 mmol/L (ref 3.5–5.1)
Sodium: 141 mmol/L (ref 135–145)
Total Bilirubin: 0.6 mg/dL (ref 0.3–1.2)
Total Protein: 6.3 g/dL — ABNORMAL LOW (ref 6.5–8.1)

## 2019-03-25 MED ORDER — SODIUM CHLORIDE 0.9% FLUSH
10.0000 mL | INTRAVENOUS | Status: DC | PRN
  Administered 2019-03-25: 10 mL via INTRAVENOUS
  Filled 2019-03-25: qty 10

## 2019-03-25 MED ORDER — HEPARIN SOD (PORK) LOCK FLUSH 100 UNIT/ML IV SOLN
500.0000 [IU] | Freq: Once | INTRAVENOUS | Status: AC | PRN
  Administered 2019-03-25: 500 [IU]
  Filled 2019-03-25: qty 5

## 2019-03-25 MED ORDER — SODIUM CHLORIDE 0.9 % IV SOLN
Freq: Once | INTRAVENOUS | Status: AC
  Administered 2019-03-25: 15:00:00 via INTRAVENOUS
  Filled 2019-03-25: qty 250

## 2019-03-25 MED ORDER — SODIUM CHLORIDE 0.9 % IV SOLN
9.6000 mg/kg | Freq: Once | INTRAVENOUS | Status: AC
  Administered 2019-03-25: 740 mg via INTRAVENOUS
  Filled 2019-03-25: qty 10

## 2019-03-25 MED ORDER — SODIUM CHLORIDE 0.9% FLUSH
10.0000 mL | INTRAVENOUS | Status: DC | PRN
  Administered 2019-03-25: 10 mL
  Filled 2019-03-25: qty 10

## 2019-03-25 NOTE — Progress Notes (Signed)
[place] OFFICE PROGRESS NOTE  [name], MD [address] [place] [address]  DIAGNOSIS: Stage IIIB(T1b, N3, M0) non-small cell lung cancer favoring adenocarcinoma diagnosed in January 2020 and presented with left lower lobe pulmonary nodule in addition to bilateral hilar and subcarinal lymphadenopathy.   PRIOR THERAPY: Concurrent chemoradiation with weekly carboplatin for an AUC of 2 and paclitaxel 45 mg/m2. Status post 5 cycles.  CURRENT THERAPY: Consolidation immunotherapy with Imfinzi 10 mg/kg IV every 2 weeks. First dose expected March 25, 2019.   INTERVAL HISTORY: Robert Dixon 78 y.o. male returns to the clinic today for a follow-up visit.  The patient is here to start consolidation immunotherapy with Imfinzi.  He has been feeling well since completion of his concurrent chemoradiation a month and a half ago.  During the course of his chemoradiation treatment, he developed odynophagia secondary to his radiotherapy. This has improved over the last several weeks and he states that he is almost able to eat "just about anything" now.  He lost approximately 6 pounds over the last 2 months.  Otherwise he is feeling well today.  He denies any fevers, chills, or night sweats.  He denies any chest pain or hemoptysis.  He endorses his baseline productive cough and shortness of breath with exertion.  He is on 2 L of home oxygen.  He denies any nausea, vomiting, diarrhea, or constipation.  He denies any headaches or visual changes.  He recently had a Port-A-Cath placement and he tolerated that well without any adverse effects.  He is here today for evaluation before starting his first cycle of immunotherapy with Imfinzi.   MEDICAL HISTORY: Past Medical History:  Diagnosis Date  . Cancer ([facility])    skin-   . Complication of anesthesia   . COPD (chronic obstructive pulmonary disease) ([facility])   . Dyspnea   . GERD (gastroesophageal reflux disease)   . Hemoptysis  11/20/2018  . History of hiatal hernia   . Hypertension   . Hypoxia 11/20/2018  . 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 Migraine with visual aura   . Non-small cell lung cancer ([place]) dx'd 11/20/18  . Pneumonia 11/20/2018  . PONV (postoperative nausea and vomiting)   . Pulmonary nodule 11/20/2018    ALLERGIES:  has No Known Allergies.  MEDICATIONS:  Current Outpatient Medications  Medication Sig Dispense Refill  . amLODipine (NORVASC) 10 MG tablet Take 5 mg by mouth daily.     . benazepril (LOTENSIN) 10 MG tablet Take 10 mg by mouth daily.    Marland Kitchen lidocaine-prilocaine (EMLA) cream Apply 1 application topically as needed. 30 g 0  . pantoprazole (PROTONIX) 40 MG tablet Take 40 mg by mouth 2 (two) times daily.    Marland Kitchen albuterol (PROVENTIL HFA;VENTOLIN HFA) 108 (90 Base) MCG/ACT inhaler Inhale 2 puffs into the lungs every 6 (six) hours as needed for wheezing or shortness of breath. (Patient not taking: Reported on 03/25/2019) 1 Inhaler 6  . dimenhyDRINATE (DRAMAMINE) 50 MG tablet Take 50 mg by mouth every 8 (eight) hours as needed for dizziness.     Marland Kitchen HYDROcodone-homatropine (HYCODAN) 5-1.5 MG/5ML syrup Take 5 mLs by mouth every 6 (six) hours as needed for cough.     . prochlorperazine (COMPAZINE) 10 MG tablet TAKE ONE TABLET BY MOUTH EVERY 6 HOURS AS NEEDED FOR NAUSEA OR VOMITING (Patient not taking: No sig reported) 30 tablet 0   No current facility-administered medications for this visit.     SURGICAL HISTORY:  Past Surgical History:  Procedure Laterality Date  . COLONOSCOPY W/ POLYPECTOMY    . IR IMAGING GUIDED PORT INSERTION  03/14/2019  . TONSILLECTOMY    . VASECTOMY    . VIDEO BRONCHOSCOPY WITH ENDOBRONCHIAL ULTRASOUND N/A 12/06/2018   Procedure: VIDEO BRONCHOSCOPY WITH ENDOBRONCHIAL ULTRASOUND;  Surgeon: Garner Nash, DO;  Location: [facility] OR;  Service: Thoracic;  Laterality: N/A;    REVIEW OF SYSTEMS:   Review of Systems  Constitutional: Positive for 6 lb weight loss over the last  ~6 weeks. Negative for appetite change, chills, fatigue, fever and unexpected weight change.  HENT: Positive for odynophagia (improved). Negative for mouth sores, nosebleeds, and sore throat.  Eyes: Negative for eye problems and icterus.  Respiratory: Positive for baseline productive cough producing clear sputum and dyspnea on exertion. Negative for hemoptysis and wheezing.   Cardiovascular: Negative for chest pain and leg swelling.  Gastrointestinal: Negative for abdominal pain, constipation, diarrhea, nausea and vomiting.  Genitourinary: Negative for bladder incontinence, difficulty urinating, dysuria, frequency and hematuria.   Musculoskeletal: Negative for back pain, gait problem, neck pain and neck stiffness.  Skin: Negative for itching and rash.  Neurological: Negative for dizziness, extremity weakness, gait problem, headaches, light-headedness and seizures.  Hematological: Negative for adenopathy. Does not bruise/bleed easily.  Psychiatric/Behavioral: Negative for confusion, depression and sleep disturbance. The patient is not nervous/anxious.     PHYSICAL EXAMINATION:  Blood pressure (!) 112/57, pulse 86, temperature 98 F (36.7 C), temperature source Oral, resp. rate 18, height 5\' 8"  (1.727 m), weight 165 lb 9.6 oz (75.1 kg), SpO2 95 %.  ECOG PERFORMANCE STATUS: 1 - Symptomatic but completely ambulatory  Physical Exam  Constitutional: Oriented to person, place, and time and well-developed, well-nourished, and in no distress. HENT:  Head: Normocephalic and atraumatic.  Mouth/Throat: Oropharynx is clear and moist. No oropharyngeal exudate.  Eyes: Conjunctivae are normal. Right eye exhibits no discharge. Left eye exhibits no discharge. No scleral icterus.  Neck: Normal range of motion. Neck supple.  Cardiovascular: Normal rate, regular rhythm, normal heart sounds and intact distal pulses.   Pulmonary/Chest: Effort normal and breath sounds normal. No respiratory distress. No wheezes.  No rales. On 2L of oxygen.  Abdominal: Soft. Bowel sounds are normal. Exhibits no distension and no mass. There is no tenderness.  Musculoskeletal: Normal range of motion. Exhibits no edema.  Lymphadenopathy:    No cervical adenopathy.  Neurological: Alert and oriented to person, place, and time. Exhibits normal muscle tone. Gait normal. Coordination normal.  Skin: Skin is warm and dry. No rash noted. Not diaphoretic. No erythema. No pallor.  Psychiatric: Mood, memory and judgment normal.  Vitals reviewed.  LABORATORY DATA: Lab Results  Component Value Date   WBC 5.1 03/25/2019   HGB 11.3 (L) 03/25/2019   HCT 34.2 (L) 03/25/2019   MCV 106.2 (H) 03/25/2019   PLT 207 03/25/2019      Chemistry      Component Value Date/Time   NA 141 03/25/2019 1242   K 4.1 03/25/2019 1242   CL 107 03/25/2019 1242   CO2 24 03/25/2019 1242   BUN 14 03/25/2019 1242   CREATININE 0.86 03/25/2019 1242      Component Value Date/Time   CALCIUM 8.4 (L) 03/25/2019 1242   ALKPHOS 94 03/25/2019 1242   AST 15 03/25/2019 1242   ALT 12 03/25/2019 1242   BILITOT 0.6 03/25/2019 1242       RADIOGRAPHIC STUDIES:  Ct Chest W Contrast  Result Date: 02/28/2019 CLINICAL DATA:  Followup left lung non-small-cell carcinoma. Recently completed chemotherapy and radiation therapy. Restaging. EXAM: CT CHEST WITH CONTRAST TECHNIQUE: Multidetector CT imaging of the chest was performed during intravenous contrast administration. CONTRAST:  38mL OMNIPAQUE IOHEXOL 300 MG/ML  SOLN COMPARISON:  PET-CT on 12/11/2018 FINDINGS: Cardiovascular: No acute findings. Aortic and coronary artery atherosclerosis. Mediastinum/Nodes: Left hilar mass or lymphadenopathy has decreased since previous study, currently measuring 10 mm short axis on image 79/2, compared to 2.3 cm at same location on previous study. Significant decrease in subcarinal mediastinal lymphadenopathy is also seen, currently measuring 12 mm short axis compared to 2.4 cm  previously. No new or increased areas of lymphadenopathy identified. Lungs/Pleura: Severe emphysema again seen. Previously seen nodular density in the peripheral left lower lobe has resolved since prior exam. No other suspicious pulmonary nodules or masses identified. No evidence of pleural effusion. Upper Abdomen: Normal adrenal glands. Several bilateral renal and hepatic cysts again noted. Musculoskeletal:  No suspicious bone lesions. IMPRESSION: 1. No new or progressive disease within the thorax. 2. Near complete resolution of left hilar and mediastinal lymphadenopathy. 3. Interval resolution of left lower lobe pulmonary nodule since previous study. Aortic Atherosclerosis (ICD10-I70.0) and Emphysema (ICD10-J43.9). Coronary artery atherosclerosis. Electronically Signed   By: Earle Gell M.D.   On: 02/28/2019 12:19   Ir Imaging Guided Port Insertion  Result Date: 03/14/2019 INDICATION: 78 year old with non-small cell lung carcinoma. Request for Port-A-Cath placement. EXAM: FLUOROSCOPIC AND ULTRASOUND GUIDED PLACEMENT OF A SUBCUTANEOUS PORT COMPARISON:  None. MEDICATIONS: Ancef 2 g; The antibiotic was administered within an appropriate time interval prior to skin puncture. ANESTHESIA/SEDATION: Versed 2.0 mg IV; Fentanyl 75 mcg IV; Moderate Sedation Time:  30 minutes The patient was continuously monitored during the procedure by the interventional radiology nurse under my direct supervision. FLUOROSCOPY TIME:  18 seconds, 2 mGy COMPLICATIONS: None immediate. PROCEDURE: The procedure, risks, benefits, and alternatives were explained to the patient. Questions regarding the procedure were encouraged and answered. The patient understands and consents to the procedure. Patient was placed supine on the interventional table. Ultrasound confirmed a patent right internal jugular vein. Ultrasound image was saved for documentation. The right chest and neck were cleaned with a skin antiseptic and a sterile drape was placed.  Maximal barrier sterile technique was utilized including caps, mask, sterile gowns, sterile gloves, sterile drape, hand hygiene and skin antiseptic. The right neck was anesthetized with 1% lidocaine. Small incision was made in the right neck with a blade. Micropuncture set was placed in the right internal jugular vein with ultrasound guidance. The micropuncture wire was used for measurement purposes. The right chest was anesthetized with lidocaine with epinephrine. #15 blade was used to make an incision and a subcutaneous port pocket was formed. Helper was assembled. Subcutaneous tunnel was formed with a stiff tunneling device. The port catheter was brought through the subcutaneous tunnel. The port was placed in the subcutaneous pocket. The micropuncture set was exchanged for a peel-away sheath. The catheter was placed through the peel-away sheath and the tip was positioned at the SVC/right atrium junction. Catheter placement was confirmed with fluoroscopy. The port was accessed and flushed with heparinized saline. The port pocket was closed using two layers of absorbable sutures and Dermabond. The vein skin site was closed using a single layer of absorbable suture and Dermabond. Sterile dressings were applied. Patient tolerated the procedure well without an immediate complication. Ultrasound and fluoroscopic images were taken and saved for this procedure. IMPRESSION: Placement of a subcutaneous port device. CT  injectable Port-A-Cath tip is positioned at the SVC/right atrium junction. Electronically Signed   By: Markus Daft M.D.   On: 03/14/2019 12:14     ASSESSMENT/PLAN:  This is a very pleasant 78 year old Caucasian male diagnosed with stage IIIb non-small cell lung cancer, adenocarcinoma of the left lower lobe.  He was diagnosed in January 2020.  He has no actionable mutations. The patient underwent concurrent concurrent chemoradiation with carboplatin for an AUC of 2 and paclitaxel 45 mg/m.   He is status post 5 cycles.  He was unable to proceed with cycle #6 secondary to thrombocytopenia.  The patient is currently undergoing consolidation immunotherapy with Imfinzi 10 mg/kg IV every 2 weeks. He will receive his first treatment today.  The patient was seen with Dr. Julien Nordmann today.  Labs were reviewed.  We recommend he proceed with cycle #1 today as scheduled. We will see him back for a follow-up visit in 2 weeks for evaluation before starting cycle #2. The patient was advised to call immediately if he has any concerning symptoms in the interval. The patient voices understanding of current disease status and treatment options and is in agreement with the current care plan. All questions were answered. The patient knows to call the clinic with any problems, questions or concerns. We can certainly see the patient much sooner if necessary  No orders of the defined types were placed in this encounter.    Rice Walsh L Gabreal Worton, PA-C 03/25/19  ADDENDUM: Hematology/Oncology Attending: I had a face-to-face encounter with the patient today.  I recommended his care plan.  This is a very pleasant 78 years old white male with a stage IIIb non-small cell lung cancer, adenocarcinoma status post a course of induction concurrent chemoradiation with weekly carboplatin and paclitaxel with partial response. The patient is here today to start the first dose of consolidation treatment with Imfinzi 10 mg/KG every 2 weeks. I reminded the patient of the urology consolidation treatment and the side effects were discussed with him at the previous visit. We will see him back for follow-up visit in 2 weeks for evaluation before the next cycle of his treatment. The patient was advised to call immediately if he has any concerning symptoms in the interval.  Disclaimer: This note was dictated with voice recognition software. Similar sounding words can inadvertently be transcribed and may be missed upon  review. Eilleen Kempf, MD 03/25/19

## 2019-03-25 NOTE — Patient Instructions (Signed)
[facility] Discharge Instructions for Patients Receiving Chemotherapy  Today you received the following chemotherapy agents Imfinzi  To help prevent nausea and vomiting after your treatment, we encourage you to take your nausea medication As directed by MD.   If you develop nausea and vomiting that is not controlled by your nausea medication, call the clinic.   BELOW ARE SYMPTOMS THAT SHOULD BE REPORTED IMMEDIATELY:  *FEVER GREATER THAN 100.5 F  *CHILLS WITH OR WITHOUT FEVER  NAUSEA AND VOMITING THAT IS NOT CONTROLLED WITH YOUR NAUSEA MEDICATION  *UNUSUAL SHORTNESS OF BREATH  *UNUSUAL BRUISING OR BLEEDING  TENDERNESS IN MOUTH AND THROAT WITH OR WITHOUT PRESENCE OF ULCERS  *URINARY PROBLEMS  *BOWEL PROBLEMS  UNUSUAL RASH Items with * indicate a potential emergency and should be followed up as soon as possible.  Feel free to call the clinic should you have any questions or concerns. The clinic phone number is [phone].  Please show the [facility] at check-in to the Emergency Department and triage nurse. Durvalumab injection What is this medicine? DURVALUMAB (dur VAL ue mab) is a monoclonal antibody. It is used to treat urothelial cancer and lung cancer. This medicine may be used for other purposes; ask your health care provider or pharmacist if you have questions. COMMON BRAND NAME(S): IMFINZI What should I tell my health care provider before I take this medicine? They need to know if you have any of these conditions: -diabetes -immune system problems -infection -inflammatory bowel disease -kidney disease -liver disease -lung or breathing disease -lupus -organ transplant -stomach or intestine problems -thyroid disease -an unusual or allergic reaction to durvalumab, other medicines, foods, dyes, or preservatives -pregnant or trying to get pregnant -breast-feeding How should I use this medicine? This medicine is for infusion into a vein.  It is given by a health care professional in a hospital or clinic setting. A special MedGuide will be given to you before each treatment. Be sure to read this information carefully each time. Talk to your pediatrician regarding the use of this medicine in children. Special care may be needed. Overdosage: If you think you have taken too much of this medicine contact a poison control center or emergency room at once. NOTE: This medicine is only for you. Do not share this medicine with others. What if I miss a dose? It is important not to miss your dose. Call your doctor or health care professional if you are unable to keep an appointment. What may interact with this medicine? Interactions have not been studied. This list may not describe all possible interactions. Give your health care provider a list of all the medicines, herbs, non-prescription drugs, or dietary supplements you use. Also tell them if you smoke, drink alcohol, or use illegal drugs. Some items may interact with your medicine. What should I watch for while using this medicine? This drug may make you feel generally unwell. Continue your course of treatment even though you feel ill unless your doctor tells you to stop. You may need blood work done while you are taking this medicine. Do not become pregnant while taking this medicine or for 3 months after stopping it. Women should inform their doctor if they wish to become pregnant or think they might be pregnant. There is a potential for serious side effects to an unborn child. Talk to your health care professional or pharmacist for more information. Do not breast-feed an infant while taking this medicine or for 3 months after stopping it.  What side effects may I notice from receiving this medicine? Side effects that you should report to your doctor or health care professional as soon as possible: -allergic reactions like skin rash, itching or hives, swelling of the face, lips, or  tongue -black, tarry stools -bloody or watery diarrhea -breathing problems -change in emotions or moods -change in sex drive -changes in vision -chest pain or chest tightness -chills -confusion -cough -facial flushing -fever -headache -signs and symptoms of high blood sugar such as dizziness; dry mouth; dry skin; fruity breath; nausea; stomach pain; increased hunger or thirst; increased urination -signs and symptoms of liver injury like dark yellow or brown urine; general ill feeling or flu-like symptoms; light-colored stools; loss of appetite; nausea; right upper belly pain; unusually weak or tired; yellowing of the eyes or skin -stomach pain -trouble passing urine or change in the amount of urine -weight gain or weight loss Side effects that usually do not require medical attention (report these to your doctor or health care professional if they continue or are bothersome): -bone pain -constipation -loss of appetite -muscle pain -nausea -swelling of the ankles, feet, hands -tiredness This list may not describe all possible side effects. Call your doctor for medical advice about side effects. You may report side effects to FDA at 1-800-FDA-1088. Where should I keep my medicine? This drug is given in a hospital or clinic and will not be stored at home. NOTE: This sheet is a summary. It may not cover all possible information. If you have questions about this medicine, talk to your doctor, pharmacist, or health care provider.  2019 Elsevier/Gold Standard (2017-01-23 19:25:04)

## 2019-03-25 NOTE — Patient Instructions (Signed)

## 2019-03-26 ENCOUNTER — Telehealth: Payer: Self-pay | Admitting: Radiation Oncology

## 2019-03-26 NOTE — Telephone Encounter (Signed)
  Radiation Oncology         [phone] ________________________________  Name: Robert Dixon MRN: [id number]  Date of Service: 03/26/2019  DOB: 09-Jan-1941  Post Treatment Telephone Note  Diagnosis:   Stage IIIBT1b, N3, M0 non-small cell lung cancer favoring adenocarcinoma of the LLL.  Interval Since Last Radiation:  7 weeks   12/23/18 - 02/05/19:  The patient was treated to the disease within the left lung initially to a dose of 60 Gy using a 5 field, 3-D conformal technique. The patient then received a cone down boost treatment for an additional 6 Gy. This yielded a final total dose of 66 Gy.   Narrative:  The patient was contacted today for routine follow-up. During treatment she did very well with radiotherapy and did not have significant desquamation. The patient's daughter in law took my call today and reports that [name] is doing very well. He is able to eat anything he likes now and is feeling better than he had at the completion of treatment. He just received his first immunotherapy infusion and has tolerated this thusfar.  Impression/Plan: 1. Stage IIIBT1b, N3, M0 non-small cell lung cancer favoring adenocarcinoma of the LLL. The patient has been doing well since completion of radiotherapy. We discussed that we would be happy to continue to follow him as needed. He will continue with Dr. Julien Nordmann as he receives immunotherapy.    Carola Rhine, PAC

## 2019-03-27 ENCOUNTER — Telehealth: Payer: Self-pay | Admitting: Medical Oncology

## 2019-03-27 NOTE — Telephone Encounter (Signed)
Doing well ,some abdominal "cramping" ,having Normal BM . "You know , I have had this stomach issue for years.". I instructed him to call us if it gets worse.

## 2019-03-29 DIAGNOSIS — J439 Emphysema, unspecified: Secondary | ICD-10-CM | POA: Diagnosis not present

## 2019-03-31 ENCOUNTER — Ambulatory Visit: Payer: Self-pay | Admitting: Radiation Oncology

## 2019-04-07 ENCOUNTER — Telehealth: Payer: Self-pay | Admitting: Pulmonary Disease

## 2019-04-07 NOTE — Telephone Encounter (Signed)
Pt is returning call. Cb is [phone].

## 2019-04-07 NOTE — Telephone Encounter (Signed)
Pt will call back He needs televisit or virtual visit with SG. He needs to see what times work with his daughter-in-law.  Pt aware per BI recommendations:

## 2019-04-07 NOTE — Telephone Encounter (Signed)
Returned call to patient. [name] patient last seen1/2/20 Pt had bronch for lung mass. Pt has completed radiation and chemo therapy. Pt is still on 02 therapy as prescribed. Pt wants to know what the plan is in regards to next f/u? If 02 is still needed? He has been made aware your schedule may not open up until July. Please advise if he needs to f/u with Np sooner?

## 2019-04-07 NOTE — Telephone Encounter (Signed)
LMTCB

## 2019-04-07 NOTE — Telephone Encounter (Signed)
PCCM: Please set up for telephone/virtual my chart visit with Eric Form, NP.  CC: Eric Form (he is one of my cancer patients)  I am sure he doesn't want to continue using his O2. If he has a home Spo2 monitor he and monitor himself and take himself off o2 as long as he stays above 88%. May still need it at night and consider ONO.  Thanks Garner Nash, DO [facility] 04/07/2019 10:51 AM

## 2019-04-07 NOTE — Telephone Encounter (Signed)
Please schedule as soon as he provides [place] with a time that works for him. I am here all week this  with the exception of Tuesday. Thanks so much.

## 2019-04-07 NOTE — Telephone Encounter (Signed)
Called and spoke with pt letting him know that we needed to get him scheduled for a televisit with SG to further evaluate O2 per [name] and pt verbalized understanding. Pt stated next Monday, 5/18 is first avail when daughter-in-law would be available to do visit with him and [place] so televisit has been scheduled for pt next Monday, 5/18 with SG at 2:30. Nothing further needed.

## 2019-04-08 ENCOUNTER — Inpatient Hospital Stay: Payer: Medicare HMO | Attending: Internal Medicine

## 2019-04-08 ENCOUNTER — Inpatient Hospital Stay: Payer: Medicare HMO

## 2019-04-08 ENCOUNTER — Encounter: Payer: Self-pay | Admitting: Physician Assistant

## 2019-04-08 ENCOUNTER — Other Ambulatory Visit: Payer: Self-pay

## 2019-04-08 ENCOUNTER — Inpatient Hospital Stay (HOSPITAL_BASED_OUTPATIENT_CLINIC_OR_DEPARTMENT_OTHER): Payer: Medicare HMO | Admitting: Physician Assistant

## 2019-04-08 VITALS — BP 122/56 | HR 87 | Temp 98.1°F | Resp 16 | Ht 68.0 in | Wt 169.9 lb

## 2019-04-08 DIAGNOSIS — Z9221 Personal history of antineoplastic chemotherapy: Secondary | ICD-10-CM | POA: Insufficient documentation

## 2019-04-08 DIAGNOSIS — Z923 Personal history of irradiation: Secondary | ICD-10-CM | POA: Diagnosis not present

## 2019-04-08 DIAGNOSIS — R1319 Other dysphagia: Secondary | ICD-10-CM

## 2019-04-08 DIAGNOSIS — C3432 Malignant neoplasm of lower lobe, left bronchus or lung: Secondary | ICD-10-CM | POA: Insufficient documentation

## 2019-04-08 DIAGNOSIS — K219 Gastro-esophageal reflux disease without esophagitis: Secondary | ICD-10-CM

## 2019-04-08 DIAGNOSIS — Y842 Radiological procedure and radiotherapy as the cause of abnormal reaction of the patient, or of later complication, without mention of misadventure at the time of the procedure: Secondary | ICD-10-CM | POA: Insufficient documentation

## 2019-04-08 DIAGNOSIS — Z8701 Personal history of pneumonia (recurrent): Secondary | ICD-10-CM | POA: Insufficient documentation

## 2019-04-08 DIAGNOSIS — Z95828 Presence of other vascular implants and grafts: Secondary | ICD-10-CM

## 2019-04-08 DIAGNOSIS — R5383 Other fatigue: Secondary | ICD-10-CM | POA: Insufficient documentation

## 2019-04-08 DIAGNOSIS — C3492 Malignant neoplasm of unspecified part of left bronchus or lung: Secondary | ICD-10-CM

## 2019-04-08 DIAGNOSIS — Z5112 Encounter for antineoplastic immunotherapy: Secondary | ICD-10-CM

## 2019-04-08 DIAGNOSIS — Z79899 Other long term (current) drug therapy: Secondary | ICD-10-CM | POA: Insufficient documentation

## 2019-04-08 DIAGNOSIS — Z9981 Dependence on supplemental oxygen: Secondary | ICD-10-CM | POA: Diagnosis not present

## 2019-04-08 DIAGNOSIS — I10 Essential (primary) hypertension: Secondary | ICD-10-CM | POA: Diagnosis not present

## 2019-04-08 DIAGNOSIS — J449 Chronic obstructive pulmonary disease, unspecified: Secondary | ICD-10-CM | POA: Insufficient documentation

## 2019-04-08 LAB — CMP (CANCER CENTER ONLY)
ALT: 14 U/L (ref 0–44)
AST: 14 U/L — ABNORMAL LOW (ref 15–41)
Albumin: 3.4 g/dL — ABNORMAL LOW (ref 3.5–5.0)
Alkaline Phosphatase: 96 U/L (ref 38–126)
Anion gap: 8 (ref 5–15)
BUN: 9 mg/dL (ref 8–23)
CO2: 25 mmol/L (ref 22–32)
Calcium: 8.4 mg/dL — ABNORMAL LOW (ref 8.9–10.3)
Chloride: 110 mmol/L (ref 98–111)
Creatinine: 0.89 mg/dL (ref 0.61–1.24)
GFR, Est AFR Am: >60 mL/min (ref >60)
GFR, Estimated: >60 mL/min (ref >60)
Glucose, Bld: 85 mg/dL (ref 70–99)
Potassium: 4 mmol/L (ref 3.5–5.1)
Sodium: 143 mmol/L (ref 135–145)
Total Bilirubin: 0.5 mg/dL (ref 0.3–1.2)
Total Protein: 6 g/dL — ABNORMAL LOW (ref 6.5–8.1)

## 2019-04-08 LAB — CBC WITH DIFFERENTIAL (CANCER CENTER ONLY)
Abs Immature Granulocytes: 0.03 10*3/uL (ref 0.00–0.07)
Basophils Absolute: 0.1 10*3/uL (ref 0.0–0.1)
Basophils Relative: 1 %
Eosinophils Absolute: 0.1 10*3/uL (ref 0.0–0.5)
Eosinophils Relative: 1 %
HCT: 33 % — ABNORMAL LOW (ref 39.0–52.0)
Hemoglobin: 10.9 g/dL — ABNORMAL LOW (ref 13.0–17.0)
Immature Granulocytes: 1 %
Lymphocytes Relative: 15 %
Lymphs Abs: 0.8 10*3/uL (ref 0.7–4.0)
MCH: 35.2 pg — ABNORMAL HIGH (ref 26.0–34.0)
MCHC: 33 g/dL (ref 30.0–36.0)
MCV: 106.5 fL — ABNORMAL HIGH (ref 80.0–100.0)
Monocytes Absolute: 0.7 10*3/uL (ref 0.1–1.0)
Monocytes Relative: 12 %
Neutro Abs: 3.9 10*3/uL (ref 1.7–7.7)
Neutrophils Relative %: 70 %
Platelet Count: 202 10*3/uL (ref 150–400)
RBC: 3.1 MIL/uL — ABNORMAL LOW (ref 4.22–5.81)
RDW: 14.3 % (ref 11.5–15.5)
WBC Count: 5.6 10*3/uL (ref 4.0–10.5)
nRBC: 0 % (ref 0.0–0.2)

## 2019-04-08 MED ORDER — SODIUM CHLORIDE 0.9 % IV SOLN
9.6000 mg/kg | Freq: Once | INTRAVENOUS | Status: AC
  Administered 2019-04-08: 740 mg via INTRAVENOUS
  Filled 2019-04-08: qty 4.8

## 2019-04-08 MED ORDER — SODIUM CHLORIDE 0.9% FLUSH
10.0000 mL | INTRAVENOUS | Status: DC | PRN
  Administered 2019-04-08: 13:00:00 10 mL
  Filled 2019-04-08: qty 10

## 2019-04-08 MED ORDER — SODIUM CHLORIDE 0.9% FLUSH
10.0000 mL | INTRAVENOUS | Status: DC | PRN
  Administered 2019-04-08: 10 mL via INTRAVENOUS
  Filled 2019-04-08: qty 10

## 2019-04-08 MED ORDER — SODIUM CHLORIDE 0.9 % IV SOLN
Freq: Once | INTRAVENOUS | Status: AC
  Administered 2019-04-08: 11:00:00 via INTRAVENOUS
  Filled 2019-04-08: qty 250

## 2019-04-08 MED ORDER — HEPARIN SOD (PORK) LOCK FLUSH 100 UNIT/ML IV SOLN
500.0000 [IU] | Freq: Once | INTRAVENOUS | Status: AC | PRN
  Administered 2019-04-08: 500 [IU]
  Filled 2019-04-08: qty 5

## 2019-04-08 NOTE — Patient Instructions (Signed)

## 2019-04-08 NOTE — Progress Notes (Signed)
[place] OFFICE PROGRESS NOTE  [name], MD [place] [address] [place] [address]  DIAGNOSIS: Stage IIIB(T1b, N3, M0) non-small cell lung cancer favoring adenocarcinoma diagnosed in January 2020 and presented with left lower lobe pulmonary nodule in addition to bilateral hilar and subcarinal lymphadenopathy.  PRIOR THERAPY: Concurrent chemoradiation with weekly carboplatin for an AUC of 2 and paclitaxel 45 mg/m2. Status post 5 cycles.  CURRENT THERAPY: Consolidation immunotherapy with Imfinzi 10 mg/kg IV every 2 weeks. First dose March 25, 2019.Status post 1 cycle.   INTERVAL HISTORY: NYSIR FERGUSSON 78 y.o. male returns to the clinic today for a follow-up visit.  The patient recently started consolidation immunotherapy with Imfinzi.  He tolerated his first cycle well without any adverse effects.  His fatigue and odynophagia from radiation continues to improve.  He has not lost weight since his last visit.  He denies any fever, chills, night sweats.  He denies any chest pain, shortness of breath, cough, or hemoptysis.  He is on 2 L of home oxygen for his baseline shortness of breath and is followed closely by pulmonology.  He denies any nausea, vomiting, diarrhea, or constipation.  He denies any headaches or visual changes.  He is here today for evaluation before starting cycle #2.  MEDICAL HISTORY: Past Medical History:  Diagnosis Date  . Cancer ([place])    skin-   . Complication of anesthesia   . COPD (chronic obstructive pulmonary disease) ([place])   . Dyspnea   . GERD (gastroesophageal reflux disease)   . Hemoptysis 11/20/2018  . History of hiatal hernia   . Hypertension   . Hypoxia 11/20/2018  . 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 Migraine with visual aura   . Non-small cell lung cancer ([place]) dx'd 11/20/18  . Pneumonia 11/20/2018  . PONV (postoperative nausea and vomiting)   . Pulmonary nodule 11/20/2018    ALLERGIES:  has No Known  Allergies.  MEDICATIONS:  Current Outpatient Medications  Medication Sig Dispense Refill  . albuterol (PROVENTIL HFA;VENTOLIN HFA) 108 (90 Base) MCG/ACT inhaler Inhale 2 puffs into the lungs every 6 (six) hours as needed for wheezing or shortness of breath. (Patient not taking: Reported on 03/25/2019) 1 Inhaler 6  . amLODipine (NORVASC) 10 MG tablet Take 5 mg by mouth daily.     . benazepril (LOTENSIN) 10 MG tablet Take 10 mg by mouth daily.    Marland Kitchen dimenhyDRINATE (DRAMAMINE) 50 MG tablet Take 50 mg by mouth every 8 (eight) hours as needed for dizziness.     Marland Kitchen HYDROcodone-homatropine (HYCODAN) 5-1.5 MG/5ML syrup Take 5 mLs by mouth every 6 (six) hours as needed for cough.     . lidocaine-prilocaine (EMLA) cream Apply 1 application topically as needed. 30 g 0  . pantoprazole (PROTONIX) 40 MG tablet Take 40 mg by mouth 2 (two) times daily.    . prochlorperazine (COMPAZINE) 10 MG tablet TAKE ONE TABLET BY MOUTH EVERY 6 HOURS AS NEEDED FOR NAUSEA OR VOMITING (Patient not taking: No sig reported) 30 tablet 0   No current facility-administered medications for this visit.    Facility-Administered Medications Ordered in Other Visits  Medication Dose Route Frequency Provider Last Rate Last Dose  . sodium chloride flush (NS) 0.9 % injection 10 mL  10 mL Intravenous PRN Kacen Mellinger L, PA-C   10 mL at 04/08/19 2376    SURGICAL HISTORY:  Past Surgical History:  Procedure Laterality Date  . COLONOSCOPY W/ POLYPECTOMY    . IR  IMAGING GUIDED PORT INSERTION  03/14/2019  . TONSILLECTOMY    . VASECTOMY    . VIDEO BRONCHOSCOPY WITH ENDOBRONCHIAL ULTRASOUND N/A 12/06/2018   Procedure: VIDEO BRONCHOSCOPY WITH ENDOBRONCHIAL ULTRASOUND;  Surgeon: Garner Nash, DO;  Location: [facility] OR;  Service: Thoracic;  Laterality: N/A;    REVIEW OF SYSTEMS:   Review of Systems  Constitutional: Negative for appetite change, chills, fatigue, fever and unexpected weight change.  HENT:   Negative for mouth sores,  nosebleeds, sore throat and trouble swallowing.   Eyes: Negative for eye problems and icterus.  Respiratory: Negative for cough, hemoptysis, shortness of breath and wheezing.   Cardiovascular: Negative for chest pain and leg swelling.  Gastrointestinal: Negative for abdominal pain, constipation, diarrhea, nausea and vomiting.  Genitourinary: Negative for bladder incontinence, difficulty urinating, dysuria, frequency and hematuria.   Musculoskeletal: Negative for back pain, gait problem, neck pain and neck stiffness.  Skin: Negative for itching and rash.  Neurological: Negative for dizziness, extremity weakness, gait problem, headaches, light-headedness and seizures.  Hematological: Negative for adenopathy. Does not bruise/bleed easily.  Psychiatric/Behavioral: Negative for confusion, depression and sleep disturbance. The patient is not nervous/anxious.     PHYSICAL EXAMINATION:  There were no vitals taken for this visit.  ECOG PERFORMANCE STATUS: 1 - Symptomatic but completely ambulatory  Physical Exam  Constitutional: Oriented to person, place, and time and well-developed, well-nourished, and in no distress.  HENT:  Head: Normocephalic and atraumatic.  Mouth/Throat: Oropharynx is clear and moist. No oropharyngeal exudate.  Eyes: Conjunctivae are normal. Right eye exhibits no discharge. Left eye exhibits no discharge. No scleral icterus.  Neck: Normal range of motion. Neck supple.  Cardiovascular: Normal rate, regular rhythm, normal heart sounds and intact distal pulses.   Pulmonary/Chest: Effort normal and breath sounds normal. No respiratory distress. No wheezes. No rales.  Abdominal: Soft. Bowel sounds are normal. Exhibits no distension and no mass. There is no tenderness.  Musculoskeletal: Normal range of motion. Exhibits no edema.  Lymphadenopathy:    No cervical adenopathy.  Neurological: Alert and oriented to person, place, and time. Exhibits normal muscle tone. Gait normal.  Coordination normal.  Skin: Skin is warm and dry. No rash noted. Not diaphoretic. No erythema. No pallor.  Psychiatric: Mood, memory and judgment normal.  Vitals reviewed.  LABORATORY DATA: Lab Results  Component Value Date   WBC 5.1 03/25/2019   HGB 11.3 (L) 03/25/2019   HCT 34.2 (L) 03/25/2019   MCV 106.2 (H) 03/25/2019   PLT 207 03/25/2019      Chemistry      Component Value Date/Time   NA 141 03/25/2019 1242   K 4.1 03/25/2019 1242   CL 107 03/25/2019 1242   CO2 24 03/25/2019 1242   BUN 14 03/25/2019 1242   CREATININE 0.86 03/25/2019 1242      Component Value Date/Time   CALCIUM 8.4 (L) 03/25/2019 1242   ALKPHOS 94 03/25/2019 1242   AST 15 03/25/2019 1242   ALT 12 03/25/2019 1242   BILITOT 0.6 03/25/2019 1242       RADIOGRAPHIC STUDIES:  Ir Imaging Guided Port Insertion  Result Date: 03/14/2019 INDICATION: 78 year old with non-small cell lung carcinoma. Request for Port-A-Cath placement. EXAM: FLUOROSCOPIC AND ULTRASOUND GUIDED PLACEMENT OF A SUBCUTANEOUS PORT COMPARISON:  None. MEDICATIONS: Ancef 2 g; The antibiotic was administered within an appropriate time interval prior to skin puncture. ANESTHESIA/SEDATION: Versed 2.0 mg IV; Fentanyl 75 mcg IV; Moderate Sedation Time:  30 minutes The patient was continuously monitored during the procedure  by the interventional radiology nurse under my direct supervision. FLUOROSCOPY TIME:  18 seconds, 2 mGy COMPLICATIONS: None immediate. PROCEDURE: The procedure, risks, benefits, and alternatives were explained to the patient. Questions regarding the procedure were encouraged and answered. The patient understands and consents to the procedure. Patient was placed supine on the interventional table. Ultrasound confirmed a patent right internal jugular vein. Ultrasound image was saved for documentation. The right chest and neck were cleaned with a skin antiseptic and a sterile drape was placed. Maximal barrier sterile technique was  utilized including caps, mask, sterile gowns, sterile gloves, sterile drape, hand hygiene and skin antiseptic. The right neck was anesthetized with 1% lidocaine. Small incision was made in the right neck with a blade. Micropuncture set was placed in the right internal jugular vein with ultrasound guidance. The micropuncture wire was used for measurement purposes. The right chest was anesthetized with lidocaine with epinephrine. #15 blade was used to make an incision and a subcutaneous port pocket was formed. [place] was assembled. Subcutaneous tunnel was formed with a stiff tunneling device. The port catheter was brought through the subcutaneous tunnel. The port was placed in the subcutaneous pocket. The micropuncture set was exchanged for a peel-away sheath. The catheter was placed through the peel-away sheath and the tip was positioned at the SVC/right atrium junction. Catheter placement was confirmed with fluoroscopy. The port was accessed and flushed with heparinized saline. The port pocket was closed using two layers of absorbable sutures and Dermabond. The vein skin site was closed using a single layer of absorbable suture and Dermabond. Sterile dressings were applied. Patient tolerated the procedure well without an immediate complication. Ultrasound and fluoroscopic images were taken and saved for this procedure. IMPRESSION: Placement of a subcutaneous port device. CT injectable Port-A-Cath tip is positioned at the SVC/right atrium junction. Electronically Signed   By: Markus Daft M.D.   On: 03/14/2019 12:14     ASSESSMENT/PLAN:  This is a very pleasant 78 year old Caucasian male diagnosed with stage IIIb non-small cell lung cancer, adenocarcinoma of the left lower lobe.  He was diagnosed in January 2020.  He has no actionable mutations. The patient underwent concurrent chemoradiation with carboplatin for an AUC of 2 and paclitaxel 45 mg/m.  He is status post 5 cycles.  He was unable to  proceed with cycle #6 secondary to thrombocytopenia.   The patient is currently undergoing consolidation immunotherapy with Imfinzi 10 mg/kg IV every 2 weeks.  Status post 1 cycle.  He tolerated his first treatment well without any adverse side effects. The patient was seen with Dr. Julien Nordmann today.  Labs were reviewed with the patient.  We recommend that he proceed with cycle #2 today as scheduled. I will see him back for follow-up visit in 2 weeks for evaluation prior to starting cycle #3. The patient was advised to call immediately if he has any concerning symptoms in the interval. The patient voices understanding of current disease status and treatment options and is in agreement with the current care plan. All questions were answered. The patient knows to call the clinic with any problems, questions or concerns. We can certainly see the patient much sooner if necessary  No orders of the defined types were placed in this encounter.    Brynnleigh Mcelwee L Markavious Micco, PA-C 04/08/19  ADDENDUM: Hematology/Oncology Attending: I had a face-to-face encounter with the patient today.  I recommended his care plan.  This is a very pleasant 78 years old white male with a  stage IIIb non-small cell lung cancer, adenocarcinoma status post induction concurrent chemoradiation with carboplatin and paclitaxel with partial response. The patient is currently undergoing consolidation treatment with Imfinzi every 2 weeks status post 1 cycle.  He tolerated the first cycle of his treatment well with no concerning adverse effects. I recommended for the patient to proceed with cycle #2 today. We will see him back for follow-up visit in 2 weeks for evaluation before the next cycle of his treatment. He was advised to call immediately if he has any concerning symptoms in the interval.  Disclaimer: This note was dictated with voice recognition software. Similar sounding words can inadvertently be transcribed and may be missed  upon review. Eilleen Kempf, MD 04/08/19

## 2019-04-08 NOTE — Patient Instructions (Signed)
[facility] Discharge Instructions for Patients Receiving Chemotherapy  Today you received the following chemotherapy agents Imfinzi  To help prevent nausea and vomiting after your treatment, we encourage you to take your nausea medication As directed by MD.   If you develop nausea and vomiting that is not controlled by your nausea medication, call the clinic.   BELOW ARE SYMPTOMS THAT SHOULD BE REPORTED IMMEDIATELY:  *FEVER GREATER THAN 100.5 F  *CHILLS WITH OR WITHOUT FEVER  NAUSEA AND VOMITING THAT IS NOT CONTROLLED WITH YOUR NAUSEA MEDICATION  *UNUSUAL SHORTNESS OF BREATH  *UNUSUAL BRUISING OR BLEEDING  TENDERNESS IN MOUTH AND THROAT WITH OR WITHOUT PRESENCE OF ULCERS  *URINARY PROBLEMS  *BOWEL PROBLEMS  UNUSUAL RASH Items with * indicate a potential emergency and should be followed up as soon as possible.  Feel free to call the clinic should you have any questions or concerns. The clinic phone number is [phone].  Please show the [facility] at check-in to the Emergency Department and triage nurse. Durvalumab injection What is this medicine? DURVALUMAB (dur VAL ue mab) is a monoclonal antibody. It is used to treat urothelial cancer and lung cancer. This medicine may be used for other purposes; ask your health care provider or pharmacist if you have questions. COMMON BRAND NAME(S): IMFINZI What should I tell my health care provider before I take this medicine? They need to know if you have any of these conditions: -diabetes -immune system problems -infection -inflammatory bowel disease -kidney disease -liver disease -lung or breathing disease -lupus -organ transplant -stomach or intestine problems -thyroid disease -an unusual or allergic reaction to durvalumab, other medicines, foods, dyes, or preservatives -pregnant or trying to get pregnant -breast-feeding How should I use this medicine? This medicine is for infusion into a vein.  It is given by a health care professional in a hospital or clinic setting. A special MedGuide will be given to you before each treatment. Be sure to read this information carefully each time. Talk to your pediatrician regarding the use of this medicine in children. Special care may be needed. Overdosage: If you think you have taken too much of this medicine contact a poison control center or emergency room at once. NOTE: This medicine is only for you. Do not share this medicine with others. What if I miss a dose? It is important not to miss your dose. Call your doctor or health care professional if you are unable to keep an appointment. What may interact with this medicine? Interactions have not been studied. This list may not describe all possible interactions. Give your health care provider a list of all the medicines, herbs, non-prescription drugs, or dietary supplements you use. Also tell them if you smoke, drink alcohol, or use illegal drugs. Some items may interact with your medicine. What should I watch for while using this medicine? This drug may make you feel generally unwell. Continue your course of treatment even though you feel ill unless your doctor tells you to stop. You may need blood work done while you are taking this medicine. Do not become pregnant while taking this medicine or for 3 months after stopping it. Women should inform their doctor if they wish to become pregnant or think they might be pregnant. There is a potential for serious side effects to an unborn child. Talk to your health care professional or pharmacist for more information. Do not breast-feed an infant while taking this medicine or for 3 months after stopping it.  What side effects may I notice from receiving this medicine? Side effects that you should report to your doctor or health care professional as soon as possible: -allergic reactions like skin rash, itching or hives, swelling of the face, lips, or  tongue -black, tarry stools -bloody or watery diarrhea -breathing problems -change in emotions or moods -change in sex drive -changes in vision -chest pain or chest tightness -chills -confusion -cough -facial flushing -fever -headache -signs and symptoms of high blood sugar such as dizziness; dry mouth; dry skin; fruity breath; nausea; stomach pain; increased hunger or thirst; increased urination -signs and symptoms of liver injury like dark yellow or brown urine; general ill feeling or flu-like symptoms; light-colored stools; loss of appetite; nausea; right upper belly pain; unusually weak or tired; yellowing of the eyes or skin -stomach pain -trouble passing urine or change in the amount of urine -weight gain or weight loss Side effects that usually do not require medical attention (report these to your doctor or health care professional if they continue or are bothersome): -bone pain -constipation -loss of appetite -muscle pain -nausea -swelling of the ankles, feet, hands -tiredness This list may not describe all possible side effects. Call your doctor for medical advice about side effects. You may report side effects to FDA at 1-800-FDA-1088. Where should I keep my medicine? This drug is given in a hospital or clinic and will not be stored at home. NOTE: This sheet is a summary. It may not cover all possible information. If you have questions about this medicine, talk to your doctor, pharmacist, or health care provider.  2019 Elsevier/Gold Standard (2017-01-23 19:25:04)

## 2019-04-09 ENCOUNTER — Telehealth: Payer: Self-pay | Admitting: Physician Assistant

## 2019-04-09 NOTE — Telephone Encounter (Signed)
Scheduled appt per 5/12 los - pt to get an updated schedule next visit.

## 2019-04-14 ENCOUNTER — Other Ambulatory Visit: Payer: Self-pay

## 2019-04-14 ENCOUNTER — Ambulatory Visit (INDEPENDENT_AMBULATORY_CARE_PROVIDER_SITE_OTHER): Payer: Medicare HMO | Admitting: Acute Care

## 2019-04-14 ENCOUNTER — Other Ambulatory Visit: Payer: Medicare HMO

## 2019-04-14 ENCOUNTER — Encounter: Payer: Self-pay | Admitting: Acute Care

## 2019-04-14 DIAGNOSIS — C3492 Malignant neoplasm of unspecified part of left bronchus or lung: Secondary | ICD-10-CM | POA: Diagnosis not present

## 2019-04-14 DIAGNOSIS — J432 Centrilobular emphysema: Secondary | ICD-10-CM | POA: Insufficient documentation

## 2019-04-14 NOTE — Assessment & Plan Note (Signed)
Adenocarcinoma Completed Chemo and radiation Now immunotherapy x 2 years Plan Follow up with Oncology

## 2019-04-14 NOTE — Patient Instructions (Addendum)
It is good to talk with you today. We will make an appointment for Tuesday 04/15/2019 at 2 pm with Tammy Parrett NP Arrive 15 minutes early, be prepared to stay in your car until it is time to be seen. CXR to rule out effusion/ pneumonia  We will consider starting on maintenance therapy for emphysema Please use your albuterol inhaler for shortness of breath or wheezing 2 puffs up to 3 times daily for shortness of breath or wheezing. We will do an ambulatory saturation test while you are here. Continue wearing oxygen to maintain your saturations greater than 88% at all times. We will consider doing an overnight oximetry to make sure you are not dropping your sats through the night. Please contact office for sooner follow up if symptoms do not improve or worsen or seek emergency care   Continue to socially isolate, wash hand frequently, and wear a face mask if you leave your home. Remember to utilize the early hours at grocery stores/ drug stores  for people > 53 years old or any underlying health risks, as the stores are cleaner and less crowded at these times.     Marland Kitchen

## 2019-04-14 NOTE — Assessment & Plan Note (Signed)
Emphysema per last CT chest Hypoxemia  Non-compliance with home oxygen Plan: We will make an appointment for Tuesday 04/15/2019 at 2 pm with Tammy Parrett NP Arrive 15 minutes early, be prepared to stay in your car until it is time to be seen. CXR to rule out effusion/ pneumonia  We will consider starting on maintenance therapy for emphysema to optimize your pulmonary status Please use your albuterol inhaler for shortness of breath or wheezing 2 puffs up to 3 times daily for shortness of breath or wheezing. We will do an ambulatory saturation test while you are here. Continue wearing oxygen to maintain your saturations greater than 88% at all times. We will consider doing an overnight oximetry to make sure you are not dropping your sats through the night. Please contact office for sooner follow up if symptoms do not improve or worsen or seek emergency care   Continue to socially isolate, wash hand frequently, and wear a face mask if you leave your home. Remember to utilize the early hours at grocery stores/ drug stores  for people > 52 years old or any underlying health risks, as the stores are cleaner and less crowded at these times.

## 2019-04-14 NOTE — Addendum Note (Signed)
Addended by: Elton Sin on: 04/14/2019 03:28 PM   Modules accepted: Orders

## 2019-04-14 NOTE — Progress Notes (Signed)
Virtual Visit via Telephone Note  I connected with Robert Dixon on 04/14/19 at  2:30 PM EDT by telephone and verified that I am speaking with the correct person using two identifiers.  I  confirmed date of birth and address to authenticate patient  Identity. My nurse Kirk Ruths  reviewed medications and ordered any refills required.   Location: Patient: Home Provider: [facility] [address] [place], [place]   I discussed the limitations, risks, security and privacy concerns of performing an evaluation and management service by telephone and the availability of in person appointments. I also discussed with the patient that there may be a patient responsible charge related to this service. The patient expressed understanding and agreed to proceed.  Synopsis Pt diagnosed with pneumonia 10/2018, and CT chest revealed   left hilar mass or adenopathy measuring 1.8 x 4.4 cm and causing narrowing of the left upper lobe and left lower lobe bronchi.  There was also a left lower lobe nodule measuring 1.0 x 1.1 cm.  The CT scan also showed subcarinal adenopathy measuring 2.8 x 4.4 cm and enlarged with bilateral hilar nodes.  The patient was referred to [name] and he underwent flexible video fiberoptic bronchoscopy with endobronchial ultrasound and biopsies on December 06, 2018.  The final cytology ([id number]) showed malignant cells consistent with non-small cell carcinoma.  On December 11, 2018 the patient had a PET scan and that showed vague left lower lobe subpleural pulmonary nodule measuring 1.0 cm with SUV max of 4.8.  The scan also showed hypermetabolic left hilar adenopathy measuring 2.1 x 3.7 cm with SUV max of 29.1 as well as hypermetabolic subcarinal lymphadenopathy measuring 2.7 cm with SUV max of 26.1.  No hypermetabolic extrathoracic metastases identified.  The patient also had MRI of the brain on December 13, 2018 and this was negative for metastatic disease to the brain.  [name]  kindly referred the patient to the multidisciplinary thoracic oncology clinic for evaluation and recommendation regarding treatment of his condition. The patient was seen by Dr. Lisbeth Renshaw from radiation oncology and he is expected to start concurrent radiotherapy next week. He has a history significant for  smoking 1.5 pack/day for around 40 years and quit 20 years ago.  No history of alcohol or drug abuse.  Non-small cell carcinoma of left lung, stage 3 (HCC)   12/19/2018 Initial Diagnosis    Non-small cell carcinoma of left lung, stage 3 ([place])    12/19/2018 Cancer Staging    Staging form: Lung, AJCC 8th Edition - Clinical: Stage IIIB (cT1b, cN3, cM0) - Signed by [name], MD on 12/19/2018    12/31/2018 -  Chemotherapy    The patient had palonosetron (ALOXI) injection 0.25 mg, 0.25 mg, Intravenous,  Once, 4 of 7 cycles Administration: 0.25 mg (12/31/2018), 0.25 mg (01/07/2019), 0.25 mg (01/13/2019) CARBOplatin (PARAPLATIN) 190 mg in sodium chloride 0.9 % 250 mL chemo infusion, 190 mg (100 % of original dose 190.2 mg), Intravenous,  Once, 4 of 7 cycles Dose modification: 190.2 mg (original dose 190.2 mg, Cycle 1) Administration: 190 mg (12/31/2018), 190 mg (01/07/2019), 190 mg (01/13/2019) PACLitaxel (TAXOL) 90 mg in sodium chloride 0.9 % 250 mL chemo infusion (</= 80mg /m2), 45 mg/m2 = 90 mg, Intravenous,  Once, 4 of 7 cycles Administration: 90 mg (12/31/2018), 90 mg (01/07/2019), 90 mg (01/13/2019)  for chemotherapy treatment.      Prior  THERAPY:concurrent chemoradiation with weekly carboplatin and paclitaxel   Current Therapy Consolidation immunotherapy with Imfinzi  10 mg/kg IV every 2 weeks. First dose expected March 25, 2019.   Last infusion 5/12  History of Present Illness: Pt presents for telephone follow up. He was recently diagnosed with non-small cell carcinoma and is being treated with concurrent chemo and radiation. He has required oxygen to maintain oxygen saturations>  88%. He states today that he has been wearing his oxygen at night but not during the day or with exertion.He states his oxygen saturations drop as low as 85% on RA. He is checking his oxygen saturations at home.He said he was told that 85% was normal for him. The patient had misunderstood what the nurse who relayed the message from [name] told him . He thought she told him that Dr, Valeta Harms wanted the patient off his oxygen totally. He has been dropping his sats and getting dizzy with exertion.  I reviewed Dr. Fabio Bering recommendations . I explained he needs to wear his oxygen to maintain his saturations > 88% at all times. His daughter in law Robert Dixon was also on the phone. She also felt he had misunderstood the recommendations the CMA had relayed on the phone.   He is now just on the immunotherapy, he has had his first 2 treatments . Per his daughter in law he will have immunotherapy every 2 weeks for the next 2 years He has had no fevers.Cough with productive sputum. It is clear.     Observations/Objective: 02/28/2019 CT Chest Severe Emphysema No new or progressive disease within the thorax. Near complete resolution of left hilar and mediastinal lymphadenopathy. Interval resolution of left lower lobe pulmonary nodule since previous study  PET Scan 11/27/7508 Hypermetabolic mediastinal and left hilar adenopathy. Differential considerations include small-cell lung cancer or a lymphoproliferative process such as lymphoma. Reticulonodular lingular and central left lower lobe opacities are likely related to postobstructive pneumonitis from left hilar adenopathy. Hypermetabolic subpleural left lower lobe pulmonary nodule which could represent a primary bronchogenic carcinoma (i.e. Small-cell lung cancer can present in this way) or also be related to postobstructive pneumonitis. No hypermetabolic extrathoracic metastasis. Aortic atherosclerosis (ICD10-I70.0), coronary artery atherosclerosis and  emphysema (ICD10-J43.9). Indeterminate left renal lesions which could represent complex cysts or solid neoplasms. Consider nonemergent pre and post contrast abdominal MRI (preferred) or CT. Low-level hypermetabolism within both adrenal glands, without well-defined dominant mass. Favored to be physiologic. Recommend attention on follow-up.  12/13/2018 MR Brain>>  evidence of metastatic disease.  Atrophy and chronic small-vessel ischemic changes.  12/06/2018: BRONCHIAL BRUSHING (B) LEFT UPPER LOBE BRUSHING (SPECIMEN 3 OF 3, COLLECTED ON 12/06/18): MALIGNANT CELLS CONSISTENT WITH NON SMALL CELL CARCINOMA 12/06/2018 FINE NEEDLE ASPIRATION, ENDOSCOPIC (A) 7 NODE (SPECIMEN 1 OF 3 COLLECTED 12/06/2018) MALIGNANT CELLS CONSISTENT WITH NON-SMALL CELL CARCINOMA.  adenocarcinoma with no actionable mutations and currently undergoing a   CT chest 11/20/2018: Left hilar mass with associated adenopathy endobronchial narrowing, subcarinal and bilateral hilar adenopathy. Ill-defined small density within the liver possible focus of metastatic disease.  Assessment and Plan: Hypoxemia Home oxygen Non-small cell carcinoma of left lung, stage 3 (HCC) Severe Emphysema per imaging>> N o PFT's on file Needs Education re Emphysema We will make an appointment for Tuesday 04/15/2019 at 2 pm with Tammy Parrett NP Arrive 15 minutes early, be prepared to stay in your car until it is time to be seen. CXR to rule out effusion/ pneumonia  We will consider starting on maintenance therapy for emphysema to optimize your pulmonary status Please use your albuterol inhaler for shortness of breath or wheezing 2  puffs up to 3 times daily for shortness of breath or wheezing. We will do an ambulatory saturation test while you are here. Continue wearing oxygen to maintain your saturations greater than 88% at all times. We will consider doing an overnight oximetry to make sure you are not dropping your sats through the  night. Please contact office for sooner follow up if symptoms do not improve or worsen or seek emergency care   Continue to socially isolate, wash hand frequently, and wear a face mask if you leave your home. Remember to utilize the early hours at grocery stores/ drug stores  for people > 67 years old or any underlying health risks, as the stores are cleaner and less crowded at these times.     Follow Up Instructions: Follow up appointment for emphysema and acute on chronic respiratory failure 04/15/2019 with Rexene Edison, NP   I discussed the assessment and treatment plan with the patient. The patient was provided an opportunity to ask questions and all were answered. The patient agreed with the plan and demonstrated an understanding of the instructions.   The patient was advised to call back or seek an in-person evaluation if the symptoms worsen or if the condition fails to improve as anticipated.  I provided 25 minutes of non-face-to-face time during this encounter.   Magdalen Spatz, NP  04/14/2019 3:24 PM

## 2019-04-15 ENCOUNTER — Other Ambulatory Visit: Payer: Medicare HMO

## 2019-04-15 ENCOUNTER — Other Ambulatory Visit: Payer: Self-pay

## 2019-04-15 ENCOUNTER — Encounter: Payer: Self-pay | Admitting: Adult Health

## 2019-04-15 ENCOUNTER — Ambulatory Visit (INDEPENDENT_AMBULATORY_CARE_PROVIDER_SITE_OTHER): Payer: Medicare HMO

## 2019-04-15 ENCOUNTER — Telehealth: Payer: Self-pay | Admitting: *Deleted

## 2019-04-15 ENCOUNTER — Ambulatory Visit (INDEPENDENT_AMBULATORY_CARE_PROVIDER_SITE_OTHER): Payer: Medicare HMO | Admitting: Adult Health

## 2019-04-15 VITALS — BP 110/60 | HR 84 | Ht 68.0 in | Wt 164.0 lb

## 2019-04-15 DIAGNOSIS — R0602 Shortness of breath: Secondary | ICD-10-CM | POA: Diagnosis not present

## 2019-04-15 DIAGNOSIS — Z20822 Contact with and (suspected) exposure to covid-19: Secondary | ICD-10-CM

## 2019-04-15 DIAGNOSIS — J9611 Chronic respiratory failure with hypoxia: Secondary | ICD-10-CM

## 2019-04-15 DIAGNOSIS — J432 Centrilobular emphysema: Secondary | ICD-10-CM | POA: Diagnosis not present

## 2019-04-15 DIAGNOSIS — J189 Pneumonia, unspecified organism: Secondary | ICD-10-CM

## 2019-04-15 DIAGNOSIS — R6889 Other general symptoms and signs: Secondary | ICD-10-CM

## 2019-04-15 DIAGNOSIS — J181 Lobar pneumonia, unspecified organism: Secondary | ICD-10-CM

## 2019-04-15 DIAGNOSIS — C3492 Malignant neoplasm of unspecified part of left bronchus or lung: Secondary | ICD-10-CM | POA: Diagnosis not present

## 2019-04-15 MED ORDER — LEVOFLOXACIN 500 MG PO TABS: 500.0000 mg | ORAL_TABLET | Freq: Every day | ORAL | 0 refills | Status: AC

## 2019-04-15 NOTE — Assessment & Plan Note (Signed)
Patient is on nocturnal oxygen at bedtime.  Now with new exertional hypoxemia in the setting of a probable new onset left lower lobe pneumonia.  We will begin oxygen at 2 L with activity.

## 2019-04-15 NOTE — Addendum Note (Signed)
Addended by: Georjean Mode on: 04/15/2019 04:40 PM   Modules accepted: Orders

## 2019-04-15 NOTE — Progress Notes (Signed)
PCCM:  Thank you for calling him.  Garner Nash, DO [facility] 04/15/2019 3:31 PM

## 2019-04-15 NOTE — Assessment & Plan Note (Addendum)
New left lower lobe pneumonia.  Patient has increased shortness of breath and cough and new patchy consolidation on chest x-ray suspect this is a infectious process with pneumonia.  As CT chest last month shows significant improvement post chemo and radiation.  Patient will need close follow-up.  We will treat with antibiotics .  Repeat chest x-ray in 1 week and follow for clearance. Will notify oncology of patient's diagnosis of pneumonia, as patient is scheduled for immunotherapy next week  With new onset PNA and exertional hypoxemia - will check COVID 19, although low suspicion.   Plan  Patient Instructions  Begin Levaquin 500mg  daily , take with food.  Mucinex DM Twice daily As needed  Cough/congestion  Begin Oxygen 2l/m with activity .  Continue on Oxygen At bedtime  .  COVID 19 test .  Follow in 1 week with chest xray and As needed   Please contact office for sooner follow up if symptoms do not improve or worsen or seek emergency care

## 2019-04-15 NOTE — Telephone Encounter (Signed)
Provider: Rexene Edison, NP  High risk: cancer patient Symptoms: increased SOB, pnuenomia

## 2019-04-15 NOTE — Assessment & Plan Note (Signed)
Will need PFT going forward to evaluate underlying lung function .

## 2019-04-15 NOTE — Progress Notes (Signed)
@Patient  ID: Robert Dixon, male    DOB: April 28, 1941, 78 y.o.   MRN: [id number]  Chief Complaint  Patient presents with  . Follow-up    Referring provider: [name], MD  HPI: 78 year old male former smoker seen for lung mass.  CT chest revealed a left hilar mass measuring 4.4 cm with a narrowing of the left upper lobe and left lower lobe bronchi.  Along with positive lymphadenopathy.  Patient underwent a EBUS on December 06, 2018 with pathology/cytology positive for malignant cells consistent with non-small cell carcinoma. -Stage IIIb .   patient was referred to oncology he has been started on Palonosetron, Carboplatin,. Taxol  . Currently on Immunotherapy with Imfinzi  .  MRI brain was negative for metastatic disease.  Patient has completed radiation treatments   Patient says he has had progressive shortness of breath with activity for last few weeks.  Has decreased activity tolerance.  Complains of cough x 2 weeks , minimally productive. No fever or discolored.  He is on oxygen at nighttime.  Has noticed that when he checks his oxygen during the daytime his oxygen dropped down to 85% on room air.  He is not on oxygen during the daytime. Today in the office walk test showed O2 saturations on room air dropped to 84% on room air, >90% on 2l/m .  No chest pain, orthopnea or edema. Appetite is fair.  Last immunotherapy was 1 week ago. CBC last week showed normal wbc.  Serial CT chest done on February 28, 2019 post treatment showed previously seen nodular density in the left lower lobe had resolved.  Decreased left hilar mass/lymphadenopathy from 2.3 cm to 10 mm.  Significant decrease in subcarinal and mediastinal lymphadenopathy.  Decreased from 2.4 cm to 12 mm.  No new or increased areas of lymphadenopathy Chest xray today shows new patchy consolidation along LLL.   Patient daughter in law Is on phone for visit.   TEST [facility] San  02/28/2019 CT Chest Severe Emphysema No new or progressive  disease within the thorax. Near complete resolution of left hilar and mediastinal lymphadenopathy. Interval resolution of left lower lobe pulmonary nodule since previous study  PET Scan 05/22/9484 Hypermetabolic mediastinal and left hilar adenopathy. Differential considerations include small-cell lung cancer or a lymphoproliferative process such as lymphoma. Reticulonodular lingular and central left lower lobe opacities are likely related to postobstructive pneumonitis from left hilar adenopathy. Hypermetabolic subpleural left lower lobe pulmonary nodule which could represent a primary bronchogenic carcinoma (i.e. Small-cell lung cancer can present in this way) or also be related to postobstructive pneumonitis. No hypermetabolic extrathoracic metastasis. Aortic atherosclerosis (ICD10-I70.0), coronary artery atherosclerosis and emphysema (ICD10-J43.9). Indeterminate left renal lesions which could represent complex cysts or solid neoplasms. Consider nonemergent pre and post contrast abdominal MRI (preferred) or CT. Low-level hypermetabolism within both adrenal glands, without well-defined dominant mass. Favored to be physiologic. Recommend attention on follow-up.  12/13/2018 MR Brain>>  evidence of metastatic disease.  Atrophy and chronic small-vessel ischemic changes.  12/06/2018: BRONCHIAL BRUSHING (B) LEFT UPPER LOBE BRUSHING (SPECIMEN 3 OF 3, COLLECTED ON 12/06/18): MALIGNANT CELLS CONSISTENT WITH NON SMALL CELL CARCINOMA 12/06/2018 FINE NEEDLE ASPIRATION, ENDOSCOPIC (A) 7 NODE (SPECIMEN 1 OF 3 COLLECTED 12/06/2018) MALIGNANT CELLS CONSISTENT WITH NON-SMALL CELL CARCINOMA.  adenocarcinoma with no actionable mutations and currently undergoing a   CT chest 11/20/2018: Left hilar mass with associated adenopathy endobronchial narrowing, subcarinal and bilateral hilar adenopathy. Ill-defined small density within the liver possible focus of metastatic disease.    No  Known  Allergies  Immunization History  Administered Date(s) Administered  . Influenza, High Dose Seasonal PF 09/04/2018    Past Medical History:  Diagnosis Date  . Cancer ([place])    skin-   . Complication of anesthesia   . COPD (chronic obstructive pulmonary disease) ([place])   . Dyspnea   . GERD (gastroesophageal reflux disease)   . Hemoptysis 11/20/2018  . History of hiatal hernia   . Hypertension   . Hypoxia 11/20/2018  . 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 Migraine with visual aura   . Non-small cell lung cancer ([place]) dx'd 11/20/18  . Pneumonia 11/20/2018  . PONV (postoperative nausea and vomiting)   . Pulmonary nodule 11/20/2018    Tobacco History: Social History   Tobacco Use  Smoking Status Former Smoker  . Years: 30.00  . Types: Cigarettes  . Last attempt to quit: 2000  . Years since quitting: 20.3  Smokeless Tobacco Never Used   Counseling given: Not Answered   Outpatient Medications Prior to Visit  Medication Sig Dispense Refill  . albuterol (PROVENTIL HFA;VENTOLIN HFA) 108 (90 Base) MCG/ACT inhaler Inhale 2 puffs into the lungs every 6 (six) hours as needed for wheezing or shortness of breath. 1 Inhaler 6  . amLODipine (NORVASC) 10 MG tablet Take 5 mg by mouth daily.     . benazepril (LOTENSIN) 10 MG tablet Take 10 mg by mouth daily.    Marland Kitchen dimenhyDRINATE (DRAMAMINE) 50 MG tablet Take 50 mg by mouth every 8 (eight) hours as needed for dizziness.     Marland Kitchen HYDROcodone-homatropine (HYCODAN) 5-1.5 MG/5ML syrup Take 5 mLs by mouth every 6 (six) hours as needed for cough.     . lidocaine-prilocaine (EMLA) cream Apply 1 application topically as needed. 30 g 0  . pantoprazole (PROTONIX) 40 MG tablet Take 40 mg by mouth 2 (two) times daily.    . prochlorperazine (COMPAZINE) 10 MG tablet TAKE ONE TABLET BY MOUTH EVERY 6 HOURS AS NEEDED FOR NAUSEA OR VOMITING 30 tablet 0   No facility-administered medications prior to visit.    - SH   former smoker  Administrator .  Retired  now .   Review of Systems:   Constitutional:   No  weight loss, night sweats,  Fevers, chills, + fatigue, or  lassitude.  HEENT:   No headaches,  Difficulty swallowing,  Tooth/dental problems, or  Sore throat,                No sneezing, itching, ear ache, nasal congestion, post nasal drip,   CV:  No chest pain,  Orthopnea, PND, swelling in lower extremities, anasarca, dizziness, palpitations, syncope.   GI  No heartburn, indigestion, abdominal pain, nausea, vomiting, diarrhea, change in bowel habits, loss of appetite, bloody stools.   Resp: No shortness of breath with exertion or at rest.  No excess mucus, no productive cough,  No non-productive cough,  No coughing up of blood.  No change in color of mucus.  No wheezing.  No chest wall deformity  Skin: no rash or lesions.  GU: no dysuria, change in color of urine, no urgency or frequency.  No flank pain, no hematuria   MS:  No joint pain or swelling.  No decreased range of motion.  No back pain.    Physical Exam  BP 110/60 (BP Location: Left Arm, Cuff Size: Normal)   Pulse 84   Ht 5\' 8"  (1.727 m)   Wt 164 lb (74.4 kg)   SpO2 91%  BMI 24.94 kg/m   GEN: A/Ox3; pleasant , NAD,elderly    HEENT:  /AT,  EACs-clear, TMs-wnl, NOSE-clear, THROAT-clear, no lesions, no postnasal drip or exudate noted.   NECK:  Supple w/ fair ROM; no JVD; normal carotid impulses w/o bruits; no thyromegaly or nodules palpated; no lymphadenopathy.    RESP decreased BS in bases , no accessory muscle use, no dullness to percussion  CARD:  RRR, no m/r/g, no peripheral edema, pulses intact, no cyanosis or clubbing.  GI:   Soft & nt; nml bowel sounds; no organomegaly or masses detected.   Musco: Warm bil, no deformities or joint swelling noted.   Neuro: alert, no focal deficits noted.    Skin: Warm, no lesions or rashes    Lab Results:  CBC    Component Value Date/Time   WBC 5.6 04/08/2019 0914   WBC 6.5 03/14/2019 0929   RBC 3.10 (L)  04/08/2019 0914   HGB 10.9 (L) 04/08/2019 0914   HCT 33.0 (L) 04/08/2019 0914   PLT 202 04/08/2019 0914   MCV 106.5 (H) 04/08/2019 0914   MCH 35.2 (H) 04/08/2019 0914   MCHC 33.0 04/08/2019 0914   RDW 14.3 04/08/2019 0914   LYMPHSABS 0.8 04/08/2019 0914   MONOABS 0.7 04/08/2019 0914   EOSABS 0.1 04/08/2019 0914   BASOSABS 0.1 04/08/2019 0914    BMET    Component Value Date/Time   NA 143 04/08/2019 0914   K 4.0 04/08/2019 0914   CL 110 04/08/2019 0914   CO2 25 04/08/2019 0914   GLUCOSE 85 04/08/2019 0914   BUN 9 04/08/2019 0914   CREATININE 0.89 04/08/2019 0914   CALCIUM 8.4 (L) 04/08/2019 0914   GFRNONAA >60 04/08/2019 0914   GFRAA >60 04/08/2019 0914    BNP No results found for: BNP  ProBNP No results found for: PROBNP  Imaging: Dg Chest 2 View  Result Date: 04/15/2019 CLINICAL DATA:  Shortness of breath EXAM: CHEST - 2 VIEW COMPARISON:  11/11/2018 FINDINGS: There is consolidation involving the left lower lobe and likely the lingula as well. There are findings of emphysema and COPD. Road well-positioned right-sided Port-A-Cath. There is scarring versus atelectasis in the right mid lung zone. There is no pneumothorax or large pleural effusion. IMPRESSION: 1. Left lower lobe pneumonia. 2. Well-positioned right-sided Port-A-Cath. 3. Emphysematous changes. Electronically Signed   By: Constance Holster M.D.   On: 04/15/2019 14:37    durvalumab (IMFINZI) 740 mg in sodium chloride 0.9 % 100 mL chemo infusion    Date Action Dose Route User   03/25/2019 1626 Rate/Dose Change (none) (none) Lester [place], [place]   03/25/2019 1526 Rate/Dose Change (none) (none) Lester [place], RN   03/25/2019 1526 New Bag/Given 740 mg Intravenous [place], Loren M, RN    durvalumab (IMFINZI) 740 mg in sodium chloride 0.9 % 100 mL chemo infusion    Date Action Dose Route User   04/08/2019 1238 Rate/Dose Change (none) (none) Royston Bake, [place]   04/08/2019 1237 Rate/Dose Change (none) (none) Royston Bake, [place]   04/08/2019 1137 Rate/Dose Change (none) (none) Royston Bake, [place]   04/08/2019 1137 New Bag/Given 740 mg Intravenous [name], RN    heparin lock flush 100 unit/mL    Date Action Dose Route User   03/25/2019 1632 Given 500 Units Intracatheter Lester Seymour, RN    heparin lock flush 100 unit/mL    Date Action Dose Route User   04/08/2019 1249 Given [place],  Deliah Goody, RN    0.9 %  sodium chloride infusion    Date Action Dose Route User   03/25/2019 1631 Rate/Dose Change (none) (none) Lester [place], RN   03/25/2019 1627 Rate/Dose Change (none) (none) Lester [place], RN   03/25/2019 1435 New Bag/Given (none) Intravenous Arty Baumgartner, RN    0.9 %  sodium chloride infusion    Date Action Dose Route User   04/08/2019 1245 Rate/Dose Change (none) (none) Royston Bake, [place]   04/08/2019 1241 Rate/Dose Change (none) (none) Royston Bake, [place]   04/08/2019 1053 Rate/Dose Verify (none) (none) Royston Bake, RN   04/08/2019 1049 New Bag/Given (none) Intravenous [place], Santiago Glad A, [place]    sodium chloride flush (NS) 0.9 % injection 10 mL    Date Action Dose Route User   03/25/2019 1300 Given 10 mL Intravenous Murlean Hark H, LPN    sodium chloride flush (NS) 0.9 % injection 10 mL    Date Action Dose Route User   03/25/2019 1632 Given 10 mL Intracatheter Lester [place], RN    sodium chloride flush (NS) 0.9 % injection 10 mL    Date Action Dose Route User   04/08/2019 0939 Given 10 mL Intravenous Murlean Hark H, LPN    sodium chloride flush (NS) 0.9 % injection 10 mL    Date Action Dose Route User   04/08/2019 1249 Given 10 mL Intracatheter [place], Deliah Goody, RN      No flowsheet data found.  No results found for: NITRICOXIDE      Assessment & Plan:   CAP (community acquired pneumonia) New left lower lobe pneumonia.  Patient has increased shortness of breath and cough and new patchy consolidation on chest x-ray suspect this is a  infectious process with pneumonia.  As CT chest last month shows significant improvement post chemo and radiation.  Patient will need close follow-up.  We will treat with antibiotics .  Repeat chest x-ray in 1 week and follow for clearance. Will notify oncology of patient's diagnosis of pneumonia, as patient is scheduled for immunotherapy next week  With new onset PNA and exertional hypoxemia - will check COVID 19, although low suspicion.   Plan  Patient Instructions  Begin Levaquin 500mg  daily , take with food.  Mucinex DM Twice daily As needed  Cough/congestion  Begin Oxygen 2l/m with activity .  Continue on Oxygen At bedtime  .  COVID 19 test .  Follow in 1 week with chest xray and As needed   Please contact office for sooner follow up if symptoms do not improve or worsen or seek emergency care       Chronic respiratory failure with hypoxia ([place]) Patient is on nocturnal oxygen at bedtime.  Now with new exertional hypoxemia in the setting of a probable new onset left lower lobe pneumonia.  We will begin oxygen at 2 L with activity.  Non-small cell carcinoma of left lung, stage 3 ([facility]) Status post chemo and radiation.  Now on immunotherapy. Continue follow-up with oncology.  We will leave to oncology as patient has immunotherapy infusion next week if this needs to continue or be postponed with new diagnosis of pneumonia  Centrilobular emphysema ([place]) Will need PFT going forward to evaluate underlying lung function .      Rexene Edison, NP 04/15/2019

## 2019-04-15 NOTE — Assessment & Plan Note (Signed)
Status post chemo and radiation.  Now on immunotherapy. Continue follow-up with oncology.  We will leave to oncology as patient has immunotherapy infusion next week if this needs to continue or be postponed with new diagnosis of pneumonia

## 2019-04-15 NOTE — Patient Instructions (Addendum)
Begin Levaquin 500mg  daily , take with food.  Mucinex DM Twice daily As needed  Cough/congestion  Begin Oxygen 2l/m with activity .  Continue on Oxygen At bedtime  .  COVID 19 test .  Follow in 1 week with chest xray and As needed   Please contact office for sooner follow up if symptoms do not improve or worsen or seek emergency care

## 2019-04-16 ENCOUNTER — Telehealth: Payer: Self-pay | Admitting: Internal Medicine

## 2019-04-16 NOTE — Telephone Encounter (Signed)
Faxed medical records to [facility]. Release XA#[id number]

## 2019-04-16 NOTE — Progress Notes (Signed)
PCCM: Agree. Thanks for calling.  [facility] 04/16/2019 4:47 PM

## 2019-04-18 ENCOUNTER — Telehealth: Payer: Self-pay | Admitting: Medical Oncology

## 2019-04-18 LAB — NOVEL CORONAVIRUS, NAA: SARS-CoV-2, NAA: NOT DETECTED

## 2019-04-18 NOTE — Telephone Encounter (Signed)
On antibiotic for "Pneumonia" Asking if he needs to stay on schedule with his appts next week .  I told him yes.

## 2019-04-22 ENCOUNTER — Inpatient Hospital Stay: Payer: Medicare HMO

## 2019-04-22 ENCOUNTER — Other Ambulatory Visit: Payer: Self-pay

## 2019-04-22 ENCOUNTER — Encounter: Payer: Self-pay | Admitting: Physician Assistant

## 2019-04-22 ENCOUNTER — Inpatient Hospital Stay (HOSPITAL_BASED_OUTPATIENT_CLINIC_OR_DEPARTMENT_OTHER): Payer: Medicare HMO | Admitting: Physician Assistant

## 2019-04-22 VITALS — BP 97/61 | HR 91 | Resp 18

## 2019-04-22 VITALS — BP 95/55 | HR 93 | Temp 98.9°F | Resp 16 | Ht 68.0 in | Wt 165.7 lb

## 2019-04-22 DIAGNOSIS — C3492 Malignant neoplasm of unspecified part of left bronchus or lung: Secondary | ICD-10-CM

## 2019-04-22 DIAGNOSIS — J449 Chronic obstructive pulmonary disease, unspecified: Secondary | ICD-10-CM

## 2019-04-22 DIAGNOSIS — I10 Essential (primary) hypertension: Secondary | ICD-10-CM | POA: Diagnosis not present

## 2019-04-22 DIAGNOSIS — C3432 Malignant neoplasm of lower lobe, left bronchus or lung: Secondary | ICD-10-CM

## 2019-04-22 DIAGNOSIS — Z923 Personal history of irradiation: Secondary | ICD-10-CM | POA: Diagnosis not present

## 2019-04-22 DIAGNOSIS — Z79899 Other long term (current) drug therapy: Secondary | ICD-10-CM

## 2019-04-22 DIAGNOSIS — R5383 Other fatigue: Secondary | ICD-10-CM

## 2019-04-22 DIAGNOSIS — R1319 Other dysphagia: Secondary | ICD-10-CM

## 2019-04-22 DIAGNOSIS — Z5112 Encounter for antineoplastic immunotherapy: Secondary | ICD-10-CM

## 2019-04-22 DIAGNOSIS — Z9221 Personal history of antineoplastic chemotherapy: Secondary | ICD-10-CM | POA: Diagnosis not present

## 2019-04-22 DIAGNOSIS — Z9981 Dependence on supplemental oxygen: Secondary | ICD-10-CM

## 2019-04-22 DIAGNOSIS — K219 Gastro-esophageal reflux disease without esophagitis: Secondary | ICD-10-CM | POA: Diagnosis not present

## 2019-04-22 DIAGNOSIS — Z95828 Presence of other vascular implants and grafts: Secondary | ICD-10-CM

## 2019-04-22 DIAGNOSIS — Z8701 Personal history of pneumonia (recurrent): Secondary | ICD-10-CM

## 2019-04-22 LAB — CBC WITH DIFFERENTIAL (CANCER CENTER ONLY)
Abs Immature Granulocytes: 0.01 10*3/uL (ref 0.00–0.07)
Basophils Absolute: 0.1 10*3/uL (ref 0.0–0.1)
Basophils Relative: 1 %
Eosinophils Absolute: 0 10*3/uL (ref 0.0–0.5)
Eosinophils Relative: 1 %
HCT: 32.7 % — ABNORMAL LOW (ref 39.0–52.0)
Hemoglobin: 10.8 g/dL — ABNORMAL LOW (ref 13.0–17.0)
Immature Granulocytes: 0 %
Lymphocytes Relative: 29 %
Lymphs Abs: 1.1 10*3/uL (ref 0.7–4.0)
MCH: 35.1 pg — ABNORMAL HIGH (ref 26.0–34.0)
MCHC: 33 g/dL (ref 30.0–36.0)
MCV: 106.2 fL — ABNORMAL HIGH (ref 80.0–100.0)
Monocytes Absolute: 0.7 10*3/uL (ref 0.1–1.0)
Monocytes Relative: 17 %
Neutro Abs: 2 10*3/uL (ref 1.7–7.7)
Neutrophils Relative %: 52 %
Platelet Count: 207 10*3/uL (ref 150–400)
RBC: 3.08 MIL/uL — ABNORMAL LOW (ref 4.22–5.81)
RDW: 14.4 % (ref 11.5–15.5)
WBC Count: 3.8 10*3/uL — ABNORMAL LOW (ref 4.0–10.5)
nRBC: 0 % (ref 0.0–0.2)

## 2019-04-22 LAB — CMP (CANCER CENTER ONLY)
ALT: 11 U/L (ref 0–44)
AST: 16 U/L (ref 15–41)
Albumin: 3.5 g/dL (ref 3.5–5.0)
Alkaline Phosphatase: 93 U/L (ref 38–126)
Anion gap: 7 (ref 5–15)
BUN: 15 mg/dL (ref 8–23)
CO2: 23 mmol/L (ref 22–32)
Calcium: 8.3 mg/dL — ABNORMAL LOW (ref 8.9–10.3)
Chloride: 109 mmol/L (ref 98–111)
Creatinine: 1.09 mg/dL (ref 0.61–1.24)
GFR, Est AFR Am: >60 mL/min (ref >60)
GFR, Estimated: >60 mL/min (ref >60)
Glucose, Bld: 111 mg/dL — ABNORMAL HIGH (ref 70–99)
Potassium: 4.4 mmol/L (ref 3.5–5.1)
Sodium: 139 mmol/L (ref 135–145)
Total Bilirubin: 0.5 mg/dL (ref 0.3–1.2)
Total Protein: 6 g/dL — ABNORMAL LOW (ref 6.5–8.1)

## 2019-04-22 LAB — TSH: TSH: 1.887 u[IU]/mL (ref 0.320–4.118)

## 2019-04-22 MED ORDER — SODIUM CHLORIDE 0.9% FLUSH
10.0000 mL | INTRAVENOUS | Status: DC | PRN
  Administered 2019-04-22: 10 mL
  Filled 2019-04-22: qty 10

## 2019-04-22 MED ORDER — SODIUM CHLORIDE 0.9 % IV SOLN
Freq: Once | INTRAVENOUS | Status: AC
  Administered 2019-04-22: 13:00:00 via INTRAVENOUS
  Filled 2019-04-22: qty 250

## 2019-04-22 MED ORDER — SODIUM CHLORIDE 0.9% FLUSH
10.0000 mL | INTRAVENOUS | Status: DC | PRN
  Administered 2019-04-22: 15:00:00 10 mL
  Filled 2019-04-22: qty 10

## 2019-04-22 MED ORDER — SODIUM CHLORIDE 0.9 % IV SOLN
9.5000 mg/kg | Freq: Once | INTRAVENOUS | Status: AC
  Administered 2019-04-22: 740 mg via INTRAVENOUS
  Filled 2019-04-22: qty 10

## 2019-04-22 MED ORDER — HEPARIN SOD (PORK) LOCK FLUSH 100 UNIT/ML IV SOLN
500.0000 [IU] | Freq: Once | INTRAVENOUS | Status: AC | PRN
  Administered 2019-04-22: 500 [IU]
  Filled 2019-04-22: qty 5

## 2019-04-22 NOTE — Progress Notes (Signed)
[facility] OFFICE PROGRESS NOTE  [name], MD [address] [place] [address]  DIAGNOSIS: Stage IIIB(T1b, N3, M0) non-small cell lung cancer favoring adenocarcinoma diagnosed in January 2020 and presented with left lower lobe pulmonary nodule in addition to bilateral hilar and subcarinal lymphadenopathy  PRIOR THERAPY: Concurrent chemoradiation with weekly carboplatin for an AUC of 2 and paclitaxel 45 mg/m2. Status post 5 cycles.  CURRENT THERAPY: Consolidation immunotherapy with Imfinzi 10 mg/kg IV every 2 weeks. First dose March 25, 2019.Status post 2 cycles.  INTERVAL HISTORY: Robert Dixon 78 y.o. male returns to the clinic for a follow-up visit.  The patient is feeling fairly well today without any concerning complaints except for some mild fatigue since being diagnosed with lung cancer in December 2019. He recently was seen by his pulmonologist and was diagnosed with pneumonia.  He was given a prescription for Levaquin and has recently completed this antibiotic course.  He has a follow up appointment with pulmonology tomorrow. He denies any fever, chills, night sweats, or weight loss.  He reports his baseline shortness of breath for which he is on 2 L of home oxygen.  The patient has a slight productive cough which he believes may be mildly improving since being on antibiotics.  He denies any chest pain or hemoptysis.  Denies any nausea, vomiting, diarrhea, or constipation.  Has been tolerating his treatment with immunotherapy fairly well without any adverse effects.  He denies any headaches or visual changes.  He denies any rashes or skin changes.  He is here today for evaluation before starting cycle #3 today as scheduled.  MEDICAL HISTORY: Past Medical History:  Diagnosis Date  . Cancer ([place])    skin-   . Complication of anesthesia   . COPD (chronic obstructive pulmonary disease) ([place])   . Dyspnea   . GERD (gastroesophageal reflux disease)    . Hemoptysis 11/20/2018  . History of hiatal hernia   . Hypertension   . Hypoxia 11/20/2018  . 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 Migraine with visual aura   . Non-small cell lung cancer ([place]) dx'd 11/20/18  . Pneumonia 11/20/2018  . PONV (postoperative nausea and vomiting)   . Pulmonary nodule 11/20/2018    ALLERGIES:  has No Known Allergies.  MEDICATIONS:  Current Outpatient Medications  Medication Sig Dispense Refill  . albuterol (PROVENTIL HFA;VENTOLIN HFA) 108 (90 Base) MCG/ACT inhaler Inhale 2 puffs into the lungs every 6 (six) hours as needed for wheezing or shortness of breath. 1 Inhaler 6  . amLODipine (NORVASC) 10 MG tablet Take 5 mg by mouth daily.     . benazepril (LOTENSIN) 10 MG tablet Take 10 mg by mouth daily.    Marland Kitchen dimenhyDRINATE (DRAMAMINE) 50 MG tablet Take 50 mg by mouth every 8 (eight) hours as needed for dizziness.     Marland Kitchen HYDROcodone-homatropine (HYCODAN) 5-1.5 MG/5ML syrup Take 5 mLs by mouth every 6 (six) hours as needed for cough.     . lidocaine-prilocaine (EMLA) cream Apply 1 application topically as needed. 30 g 0  . pantoprazole (PROTONIX) 40 MG tablet Take 40 mg by mouth 2 (two) times daily.    . prochlorperazine (COMPAZINE) 10 MG tablet TAKE ONE TABLET BY MOUTH EVERY 6 HOURS AS NEEDED FOR NAUSEA OR VOMITING 30 tablet 0  . levofloxacin (LEVAQUIN) 500 MG tablet Take 1 tablet (500 mg total) by mouth daily for 7 days. (Patient not taking: Reported on 04/22/2019) 7 tablet 0   No  current facility-administered medications for this visit.     SURGICAL HISTORY:  Past Surgical History:  Procedure Laterality Date  . COLONOSCOPY W/ POLYPECTOMY    . IR IMAGING GUIDED PORT INSERTION  03/14/2019  . TONSILLECTOMY    . VASECTOMY    . VIDEO BRONCHOSCOPY WITH ENDOBRONCHIAL ULTRASOUND N/A 12/06/2018   Procedure: VIDEO BRONCHOSCOPY WITH ENDOBRONCHIAL ULTRASOUND;  Surgeon: Garner Nash, DO;  Location: [facility] OR;  Service: Thoracic;  Laterality: N/A;    REVIEW OF  SYSTEMS:   Review of Systems  Constitutional: Positive for fatigue. Negative for appetite change, chills, fever and unexpected weight change.  HENT: Negative for mouth sores, nosebleeds, sore throat and trouble swallowing.   Eyes: Negative for eye problems and icterus.  Respiratory: Positive for improving productive cough and baseline shortness of breath. Negative for hemoptysis and wheezing.   Cardiovascular: Negative for chest pain and leg swelling.  Gastrointestinal: Negative for abdominal pain, constipation, diarrhea, nausea and vomiting.  Genitourinary: Negative for bladder incontinence, difficulty urinating, dysuria, frequency and hematuria.   Musculoskeletal: Negative for back pain, gait problem, neck pain and neck stiffness.  Skin: Negative for itching and rash.  Neurological: Negative for dizziness, extremity weakness, gait problem, headaches, light-headedness and seizures.  Hematological: Negative for adenopathy. Does not bruise/bleed easily.  Psychiatric/Behavioral: Negative for confusion, depression and sleep disturbance. The patient is not nervous/anxious.     PHYSICAL EXAMINATION:  Blood pressure (!) 95/55, pulse 93, temperature 98.9 F (37.2 C), temperature source Oral, resp. rate 16, height 5\' 8"  (1.727 m), weight 165 lb 11.2 oz (75.2 kg), SpO2 95 %.  ECOG PERFORMANCE STATUS: 1 - Symptomatic but completely ambulatory  Physical Exam  Constitutional: Oriented to person, place, and time and well-developed, well-nourished, and in no distress.  HENT:  Head: Normocephalic and atraumatic.  Mouth/Throat: Oropharynx is clear and moist. No oropharyngeal exudate.  Eyes: Conjunctivae are normal. Right eye exhibits no discharge. Left eye exhibits no discharge. No scleral icterus.  Neck: Normal range of motion. Neck supple.  Cardiovascular: Normal rate, regular rhythm, normal heart sounds and intact distal pulses.   Pulmonary/Chest: Crackles noted in LLL. Effort normal and breath  sounds normal in all other lung fields. No respiratory distress. No wheezes. He is on 2 L of home oxygen.  Abdominal: Soft. Bowel sounds are normal. Exhibits no distension and no mass. There is no tenderness.  Musculoskeletal: Normal range of motion. Exhibits no edema.  Lymphadenopathy:    No cervical adenopathy.  Neurological: Alert and oriented to person, place, and time. Exhibits normal muscle tone. Gait normal. Coordination normal.  Skin: Skin is warm and dry. No rash noted. Not diaphoretic. No erythema. No pallor.  Psychiatric: Mood, memory and judgment normal.  Vitals reviewed.  LABORATORY DATA: Lab Results  Component Value Date   WBC 3.8 (L) 04/22/2019   HGB 10.8 (L) 04/22/2019   HCT 32.7 (L) 04/22/2019   MCV 106.2 (H) 04/22/2019   PLT 207 04/22/2019      Chemistry      Component Value Date/Time   NA 139 04/22/2019 1114   K 4.4 04/22/2019 1114   CL 109 04/22/2019 1114   CO2 23 04/22/2019 1114   BUN 15 04/22/2019 1114   CREATININE 1.09 04/22/2019 1114      Component Value Date/Time   CALCIUM 8.3 (L) 04/22/2019 1114   ALKPHOS 93 04/22/2019 1114   AST 16 04/22/2019 1114   ALT 11 04/22/2019 1114   BILITOT 0.5 04/22/2019 1114  RADIOGRAPHIC STUDIES:  Dg Chest 2 View  Result Date: 04/15/2019 CLINICAL DATA:  Shortness of breath EXAM: CHEST - 2 VIEW COMPARISON:  11/11/2018 FINDINGS: There is consolidation involving the left lower lobe and likely the lingula as well. There are findings of emphysema and COPD. Road well-positioned right-sided Port-A-Cath. There is scarring versus atelectasis in the right mid lung zone. There is no pneumothorax or large pleural effusion. IMPRESSION: 1. Left lower lobe pneumonia. 2. Well-positioned right-sided Port-A-Cath. 3. Emphysematous changes. Electronically Signed   By: Constance Holster M.D.   On: 04/15/2019 14:37     ASSESSMENT/PLAN:  This is a very pleasant 78 year old Caucasian male diagnosed with stage IIIb non-small cell  lung cancer, adenocarcinoma of the left lower lobe.  He was diagnosed in January 2020.  He has no actionable mutations. The patient underwent concurrent chemoradiation with carboplatin for an AUC of 2 and paclitaxel 45 mg/m.  He is status post 5 cycles.  He was unable to proceed with cycle #6 secondary to thrombocytopenia.   The patient is currently undergoing consolidation immunotherapy with Imfinzi 10 mg/kg IV every 2 weeks.  Status post 2 cycles.  He tolerated his first treatment well without any adverse side effects.  The patient was seen with Dr. Julien Nordmann today.  Labs were reviewed with the patient.  We recommend that he proceed with cycle #3 today as scheduled.  We will see him back for follow-up visit in 2 weeks for evaluation before starting cycle #4.  Recommend that the patient follow-up with pulmonology tomorrow as planned.  The patient was advised to seek medical evaluation if he develops any new or worsening symptoms such as fever, chills, night sweats, worsening shortness of breath, or cough.  The patient's blood pressure was noted to be a little low today. He has taken his blood pressure medication today as prescribed.The patient was advised to check his blood pressure at home and keep a log of his blood pressures and discuss his hypertension medication with his PCP if indicated. He was advised to increase his fluid intake as well.  The patient was advised to call immediately if he has any concerning symptoms in the interval. The patient voices understanding of current disease status and treatment options and is in agreement with the current care plan. All questions were answered. The patient knows to call the clinic with any problems, questions or concerns. We can certainly see the patient much sooner if necessary  No orders of the defined types were placed in this encounter.    Rosie Torrez L Decarla Siemen, PA-C 04/22/19  ADDENDUM: Hematology/Oncology Attending: I had a  face-to-face encounter with the patient today.  I recommended his care plan.  This is a very pleasant 78 years old white male with a stage IIIb non-small cell lung cancer, adenocarcinoma status post induction concurrent chemoradiation and he is currently undergoing consolidation treatment with immunotherapy with Imfinzi every 2 weeks status post 2 cycles.  The patient has been tolerating this treatment well. He was treated recently for questionable pneumonia and the patient is feeling fine. I recommended for him to proceed with cycle #3 today as planned. He will come back for follow-up visit in 2 weeks for evaluation before the next cycle of his treatment. He was advised to call immediately if he has any concerning symptoms in the interval.  Disclaimer: This note was dictated with voice recognition software. Similar sounding words can inadvertently be transcribed and may be missed upon review. Eilleen Kempf, MD 04/22/19

## 2019-04-22 NOTE — Patient Instructions (Signed)
[facility] Discharge Instructions for Patients Receiving Chemotherapy  Today you received the following chemotherapy agents Imfinzi  To help prevent nausea and vomiting after your treatment, we encourage you to take your nausea medication As directed by MD.   If you develop nausea and vomiting that is not controlled by your nausea medication, call the clinic.   BELOW ARE SYMPTOMS THAT SHOULD BE REPORTED IMMEDIATELY:  *FEVER GREATER THAN 100.5 F  *CHILLS WITH OR WITHOUT FEVER  NAUSEA AND VOMITING THAT IS NOT CONTROLLED WITH YOUR NAUSEA MEDICATION  *UNUSUAL SHORTNESS OF BREATH  *UNUSUAL BRUISING OR BLEEDING  TENDERNESS IN MOUTH AND THROAT WITH OR WITHOUT PRESENCE OF ULCERS  *URINARY PROBLEMS  *BOWEL PROBLEMS  UNUSUAL RASH Items with * indicate a potential emergency and should be followed up as soon as possible.  Feel free to call the clinic should you have any questions or concerns. The clinic phone number is [phone].  Please show the [facility] at check-in to the Emergency Department and triage nurse. Durvalumab injection What is this medicine? DURVALUMAB (dur VAL ue mab) is a monoclonal antibody. It is used to treat urothelial cancer and lung cancer. This medicine may be used for other purposes; ask your health care provider or pharmacist if you have questions. COMMON BRAND NAME(S): IMFINZI What should I tell my health care provider before I take this medicine? They need to know if you have any of these conditions: -diabetes -immune system problems -infection -inflammatory bowel disease -kidney disease -liver disease -lung or breathing disease -lupus -organ transplant -stomach or intestine problems -thyroid disease -an unusual or allergic reaction to durvalumab, other medicines, foods, dyes, or preservatives -pregnant or trying to get pregnant -breast-feeding How should I use this medicine? This medicine is for infusion into a vein.  It is given by a health care professional in a hospital or clinic setting. A special MedGuide will be given to you before each treatment. Be sure to read this information carefully each time. Talk to your pediatrician regarding the use of this medicine in children. Special care may be needed. Overdosage: If you think you have taken too much of this medicine contact a poison control center or emergency room at once. NOTE: This medicine is only for you. Do not share this medicine with others. What if I miss a dose? It is important not to miss your dose. Call your doctor or health care professional if you are unable to keep an appointment. What may interact with this medicine? Interactions have not been studied. This list may not describe all possible interactions. Give your health care provider a list of all the medicines, herbs, non-prescription drugs, or dietary supplements you use. Also tell them if you smoke, drink alcohol, or use illegal drugs. Some items may interact with your medicine. What should I watch for while using this medicine? This drug may make you feel generally unwell. Continue your course of treatment even though you feel ill unless your doctor tells you to stop. You may need blood work done while you are taking this medicine. Do not become pregnant while taking this medicine or for 3 months after stopping it. Women should inform their doctor if they wish to become pregnant or think they might be pregnant. There is a potential for serious side effects to an unborn child. Talk to your health care professional or pharmacist for more information. Do not breast-feed an infant while taking this medicine or for 3 months after stopping it.  What side effects may I notice from receiving this medicine? Side effects that you should report to your doctor or health care professional as soon as possible: -allergic reactions like skin rash, itching or hives, swelling of the face, lips, or  tongue -black, tarry stools -bloody or watery diarrhea -breathing problems -change in emotions or moods -change in sex drive -changes in vision -chest pain or chest tightness -chills -confusion -cough -facial flushing -fever -headache -signs and symptoms of high blood sugar such as dizziness; dry mouth; dry skin; fruity breath; nausea; stomach pain; increased hunger or thirst; increased urination -signs and symptoms of liver injury like dark yellow or brown urine; general ill feeling or flu-like symptoms; light-colored stools; loss of appetite; nausea; right upper belly pain; unusually weak or tired; yellowing of the eyes or skin -stomach pain -trouble passing urine or change in the amount of urine -weight gain or weight loss Side effects that usually do not require medical attention (report these to your doctor or health care professional if they continue or are bothersome): -bone pain -constipation -loss of appetite -muscle pain -nausea -swelling of the ankles, feet, hands -tiredness This list may not describe all possible side effects. Call your doctor for medical advice about side effects. You may report side effects to FDA at 1-800-FDA-1088. Where should I keep my medicine? This drug is given in a hospital or clinic and will not be stored at home. NOTE: This sheet is a summary. It may not cover all possible information. If you have questions about this medicine, talk to your doctor, pharmacist, or health care provider.  2019 Elsevier/Gold Standard (2017-01-23 19:25:04)

## 2019-04-23 ENCOUNTER — Encounter: Payer: Self-pay | Admitting: Adult Health

## 2019-04-23 ENCOUNTER — Ambulatory Visit (INDEPENDENT_AMBULATORY_CARE_PROVIDER_SITE_OTHER): Payer: Medicare HMO | Admitting: Adult Health

## 2019-04-23 ENCOUNTER — Ambulatory Visit (INDEPENDENT_AMBULATORY_CARE_PROVIDER_SITE_OTHER): Payer: Medicare HMO

## 2019-04-23 VITALS — BP 120/68 | HR 90 | Temp 98.2°F | Resp 16 | Ht 68.5 in | Wt 162.0 lb

## 2019-04-23 DIAGNOSIS — J432 Centrilobular emphysema: Secondary | ICD-10-CM | POA: Diagnosis not present

## 2019-04-23 DIAGNOSIS — R6889 Other general symptoms and signs: Secondary | ICD-10-CM

## 2019-04-23 DIAGNOSIS — J181 Lobar pneumonia, unspecified organism: Secondary | ICD-10-CM

## 2019-04-23 DIAGNOSIS — J189 Pneumonia, unspecified organism: Secondary | ICD-10-CM | POA: Diagnosis not present

## 2019-04-23 DIAGNOSIS — J9611 Chronic respiratory failure with hypoxia: Secondary | ICD-10-CM

## 2019-04-23 DIAGNOSIS — Z20822 Contact with and (suspected) exposure to covid-19: Secondary | ICD-10-CM

## 2019-04-23 NOTE — Assessment & Plan Note (Signed)
Will need PFTs going forward.  Currently stable without flare.

## 2019-04-23 NOTE — Patient Instructions (Signed)
Mucinex DM Twice daily As needed  Cough/congestion  Oxygen 2l/m with activity .  Continue on Oxygen At bedtime  .  Order for POC .  Follow in 2 week with [name] or Kinnie Kaupp NP with chest xray and As needed   Please contact office for sooner follow up if symptoms do not improve or worsen or seek emergency care

## 2019-04-23 NOTE — Addendum Note (Signed)
Addended by: Hildred Alamin I on: 04/23/2019 01:02 PM   Modules accepted: Orders

## 2019-04-23 NOTE — Assessment & Plan Note (Signed)
Patient is continue on oxygen 2 L with activity and at bedtime.  Patient needs a portable device to be able to be more mobile and to go to medical appointments.  A POC order is placed today

## 2019-04-23 NOTE — Assessment & Plan Note (Signed)
Left lower lobe pneumonia clinically patient is improved with 7-day course of Levaquin.  Vital signs are stable and O2 saturations are good on 2 L of oxygen.  Patient is tolerating oral intake. Chest x-ray today has not changed much with left lower lobe consolidation-will continue to follow closely as he is immunosuppressed .  He will follow back in the office in 2 weeks with a chest x-ray.  If chest x-ray is unchanged we will consider a CT chest at that time.  Patient is advised if symptoms worsen or if he develops fever or is unable to eat he is to contact our office immediately.  Please contact office for sooner follow up if symptoms do not improve or worsen or seek emergency care

## 2019-04-23 NOTE — Progress Notes (Signed)
@Patient  ID: Robert Dixon, male    DOB: February 20, 1941, 78 y.o.   MRN: [id number]  Chief Complaint  Patient presents with   Follow-up    PNA     Referring provider: [name], MD  HPI: 78 year old male former smoker seen for pulmonary consult January 2020 for a left hilar mass.  CT chest 12/02019 measured a 4.4 cm left hilar mass with narrowing of the left upper lobe and left lower lobe bronchi along with positive lymphadenopathy.  Patient underwent an EBUS on December 06, 2018 with pathology/cytology positive for malignant cells consistent with non-small cell carcinoma stage IIIb.  Patient was seen by oncology and was started on Palonosetron, Carboplatin, Taxol.  He underwent XRT.  Currently on immunotherapy with Imfinzi.  MRI brain was negative for metastatic disease.  TEST/EVENTS :   02/28/2019 CT Chest Severe Emphysema No new or progressive disease within the thorax. Near complete resolution of left hilar and mediastinal lymphadenopathy. Interval resolution of left lower lobe pulmonary nodule since previous study  PET Scan 5/73/2202 Hypermetabolic mediastinal and left hilar adenopathy. Differential considerations include small-cell lung cancer or a lymphoproliferative process such as lymphoma. Reticulonodular lingular and central left lower lobe opacities are likely related to postobstructive pneumonitis from left hilar adenopathy. Hypermetabolic subpleural left lower lobe pulmonary nodule which could represent a primary bronchogenic carcinoma (i.e. Small-cell lung cancer can present in this way) or also be related to postobstructive pneumonitis. No hypermetabolic extrathoracic metastasis. Aortic atherosclerosis (ICD10-I70.0), coronary artery atherosclerosis and emphysema (ICD10-J43.9). Indeterminate left renal lesions which could represent complex cysts or solid neoplasms. Consider nonemergent pre and post contrast abdominal MRI (preferred) or CT. Low-level  hypermetabolism within both adrenal glands, without well-defined dominant mass. Favored to be physiologic. Recommend attention on follow-up.  12/13/2018 MR Brain>> evidence of metastatic disease.  Atrophy and chronic small-vessel ischemic changes.  12/06/2018: BRONCHIAL BRUSHING (B) LEFT UPPER LOBE BRUSHING (SPECIMEN 3 OF 3, COLLECTED ON 12/06/18): MALIGNANT CELLS CONSISTENT WITH NON SMALL CELL CARCINOMA 12/06/2018 FINE NEEDLE ASPIRATION, ENDOSCOPIC (A) 7 NODE (SPECIMEN 1 OF 3 COLLECTED 12/06/2018) MALIGNANT CELLS CONSISTENT WITH NON-SMALL CELL CARCINOMA.  adenocarcinoma with no actionable mutations and currently undergoing a  CT chest 11/20/2018: Left hilar mass with associated adenopathy endobronchial narrowing, subcarinal and bilateral hilar adenopathy. Ill-defined small density within the liver possible focus of metastatic disease.  04/23/2019 Follow up : PNA , Lung Cancer , Emphysema  Patient returns for a one-week follow-up.  Patient was seen last visit for progressive shortness of breath with activities over the last few weeks.  He also had associated cough and decreased activity tolerance.  Patient is on oxygen at bedtime.  He was noticed to have decreased oxygen levels with activity.  He was started on oxygen at 2 L.  Chest x-ray in the office showed left lower lobe consolidation consistent with pneumonia.  As above patient is undergoing treatment for non-small cell lung cancer stage IIIb.  He is currently on immunotherapy with Imfinzi. Last treatment was 04/22/2019.  Patient has had serial CT chest last done February 28, 2019 that showed previous nodular density in the left lower lobe had resolved.  Decreased left hilar mass and lymphadenopathy from 2.3 cm to 10 mm.  And significant decrease in subcarinal and mediastinal lymphadenopathy decreasing from 2.64 cm to 12 mm.  No new or increased areas of lymphadenopathy.  Patient was treated for a left lower lobe pneumonia with Levaquin 500  mg daily for 7 days.  COVID 19 test was negative.  Since last visit patient says he tolerated his antibiotic.  No nausea vomiting or diarrhea.  Patient says appetite is fair.  Patient says cough and shortness of breath have decreased slightly.  He has no fever.  Says his activity tolerance is fair.  And is feeling slightly better.  Chest x-ray today shows no significant change in left lower lobe consolidation.    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   Migraine with visual aura    Non-small cell lung cancer ([place]) dx'd 11/20/18   Pneumonia 11/20/2018   PONV (postoperative nausea and vomiting)    Pulmonary nodule 11/20/2018    Tobacco History: Social History   Tobacco Use  Smoking Status Former Smoker   Years: 30.00   Types: Cigarettes   Last attempt to quit: 2000   Years since quitting: 20.4  Smokeless Tobacco Never Used   Counseling given: Not Answered   Outpatient Medications Prior to Visit  Medication Sig Dispense Refill   albuterol (PROVENTIL HFA;VENTOLIN HFA) 108 (90 Base) MCG/ACT inhaler Inhale 2 puffs into the lungs every 6 (six) hours as needed for wheezing or shortness of breath. 1 Inhaler 6   amLODipine (NORVASC) 10 MG tablet Take 5 mg by mouth daily.      benazepril (LOTENSIN) 10 MG tablet Take 10 mg by mouth daily.     dimenhyDRINATE (DRAMAMINE) 50 MG tablet Take 50 mg by mouth every 8 (eight) hours as needed for dizziness.      HYDROcodone-homatropine (HYCODAN) 5-1.5 MG/5ML syrup Take 5 mLs by mouth every 6 (six) hours as  needed for cough.      lidocaine-prilocaine (EMLA) cream Apply 1 application topically as needed. 30 g 0   pantoprazole (PROTONIX) 40 MG tablet Take 40 mg by mouth 2 (two) times daily.     prochlorperazine (COMPAZINE) 10 MG tablet TAKE ONE TABLET BY MOUTH EVERY 6 HOURS AS NEEDED FOR NAUSEA OR VOMITING 30 tablet 0   No facility-administered medications prior to visit.      Review of Systems:   Constitutional:   No  weight loss, night sweats,  Fevers, chills,  +fatigue, or  lassitude.  HEENT:   No headaches,  Difficulty swallowing,  Tooth/dental problems, or  Sore throat,                No sneezing, itching, ear ache, nasal congestion, post nasal drip,   CV:  No chest pain,  Orthopnea, PND, swelling in lower extremities, anasarca, dizziness, palpitations, syncope.   GI  No heartburn, indigestion, abdominal pain, nausea, vomiting, diarrhea, change in bowel habits, loss of appetite, bloody stools.   Resp:  .  No chest wall deformity  Skin: no rash or lesions.  GU: no dysuria, change in color of urine, no urgency or frequency.  No flank pain, no hematuria   MS:  No joint pain or swelling.  No decreased range of motion.  No back pain.    Physical Exam  BP 120/68 (BP Location: Left Arm, Cuff Size: Normal)    Pulse  90    Temp 98.2 F (36.8 C) (Oral)    Resp 16    Ht 5' 8.5" (1.74 m)    Wt 162 lb (73.5 kg)    SpO2 92%    BMI 24.27 kg/m   GEN: A/Ox3; pleasant , NAD, elderly ,frail , on O2    HEENT:  [place]/AT,  EACs-clear, TMs-wnl, NOSE-clear, THROAT-clear, no lesions, no postnasal drip or exudate noted.   NECK:  Supple w/ fair ROM; no JVD; normal carotid impulses w/o bruits; no thyromegaly or nodules palpated; no lymphadenopathy.    RESP decreased breath sounds in the left base otherwise clear . no accessory muscle use, no dullness to percussion  CARD:  RRR, no m/r/g, no peripheral edema, pulses intact, no cyanosis or clubbing.  GI:   Soft & nt; nml bowel sounds; no organomegaly  or masses detected.   Musco: Warm bil, no deformities or joint swelling noted.   Neuro: alert, no focal deficits noted.    Skin: Warm, no lesions or rashes    Lab Results:  CBC    Component Value Date/Time   WBC 3.8 (L) 04/22/2019 1114   WBC 6.5 03/14/2019 0929   RBC 3.08 (L) 04/22/2019 1114   HGB 10.8 (L) 04/22/2019 1114   HCT 32.7 (L) 04/22/2019 1114   PLT 207 04/22/2019 1114   MCV 106.2 (H) 04/22/2019 1114   MCH 35.1 (H) 04/22/2019 1114   MCHC 33.0 04/22/2019 1114   RDW 14.4 04/22/2019 1114   LYMPHSABS 1.1 04/22/2019 1114   MONOABS 0.7 04/22/2019 1114   EOSABS 0.0 04/22/2019 1114   BASOSABS 0.1 04/22/2019 1114    BMET    Component Value Date/Time   NA 139 04/22/2019 1114   K 4.4 04/22/2019 1114   CL 109 04/22/2019 1114   CO2 23 04/22/2019 1114   GLUCOSE 111 (H) 04/22/2019 1114   BUN 15 04/22/2019 1114   CREATININE 1.09 04/22/2019 1114   CALCIUM 8.3 (L) 04/22/2019 1114   GFRNONAA >60 04/22/2019 1114   GFRAA >60 04/22/2019 1114    BNP No results found for: BNP  ProBNP No results found for: PROBNP  Imaging: Dg Chest 2 View  Result Date: 04/23/2019 CLINICAL DATA:  Follow-up pneumonia. EXAM: CHEST - 2 VIEW COMPARISON:  04/15/2019 FINDINGS: The cardiac silhouette, mediastinal and hilar contours are within normal limits and stable. Stable right IJ power port. Persistent left lower lobe airspace process consistent with pneumonia. No definite pleural effusion. Stable underlying emphysematous changes and pulmonary scarring. The bony thorax is intact. IMPRESSION: Persistent left lower lobe airspace process consistent with pneumonia. Stable underlying emphysematous changes and pulmonary scarring. Electronically Signed   By: Marijo Sanes M.D.   On: 04/23/2019 11:41   Dg Chest 2 View  Result Date: 04/15/2019 CLINICAL DATA:  Shortness of breath EXAM: CHEST - 2 VIEW COMPARISON:  11/11/2018 FINDINGS: There is consolidation involving the left lower lobe and likely the  lingula as well. There are findings of emphysema and COPD. Road well-positioned right-sided Port-A-Cath. There is scarring versus atelectasis in the right mid lung zone. There is no pneumothorax or large pleural effusion. IMPRESSION: 1. Left lower lobe pneumonia. 2. Well-positioned right-sided Port-A-Cath. 3. Emphysematous changes. Electronically Signed   By: Constance Holster M.D.   On: 04/15/2019 14:37    durvalumab (IMFINZI) 740 mg in sodium chloride 0.9 % 100 mL chemo infusion    Date Action Dose Route User   03/25/2019 1626 Rate/Dose Change (none) (none) Lester [place], RN  03/25/2019 1526 Rate/Dose Change (none) (none) Lester [place], [place]   03/25/2019 1526 New Bag/Given 740 mg Intravenous [place], Loren M, RN    durvalumab (IMFINZI) 740 mg in sodium chloride 0.9 % 100 mL chemo infusion    Date Action Dose Route User   04/08/2019 1238 Rate/Dose Change (none) (none) Royston Bake, [place]   04/08/2019 1237 Rate/Dose Change (none) (none) Royston Bake, [place]   04/08/2019 1137 Rate/Dose Change (none) (none) Royston Bake, [place]   04/08/2019 1137 New Bag/Given 740 mg Intravenous Po[place]ter, Macario Carls, RN    durvalumab (IMFINZI) 740 mg in sodium chloride 0.9 % 100 mL chemo infusion    Date Action Dose Route User   04/22/2019 1507 Rate/Dose Verify (none) (none) Thomasene Lot, RN   04/22/2019 1414 Rate/Dose Verify (none) (none) Thomasene Lot, RN   04/22/2019 1414 New Bag/Given 740 mg Intravenous Thomasene Lot, RN    heparin lock flush 100 unit/mL    Date Action Dose Route User   03/25/2019 1632 Given 500 Units Intracatheter Lester [place], [place]    heparin lock flush 100 unit/mL    Date Action Dose Route User   04/08/2019 1249 Given 500 Units Intracatheter [place], Deliah Goody, RN    heparin lock flush 100 unit/mL    Date Action Dose Route User   04/22/2019 1529 Given 500 Units Intracatheter Ricks, Janalyn Harder, RN    0.9 %  sodium chloride infusion    Date Action Dose Route User   03/25/2019  1631 Rate/Dose Change (none) (none) Lester [place], RN   03/25/2019 1627 Rate/Dose Change (none) (none) Lester [place], RN   03/25/2019 1435 New Bag/Given (none) Intravenous Tonye Royalty M, RN    0.9 %  sodium chloride infusion    Date Action Dose Route User   04/08/2019 1245 Rate/Dose Change (none) (none) Royston Bake, [place]   04/08/2019 1241 Rate/Dose Change (none) (none) Royston Bake, RN   04/08/2019 1053 Rate/Dose Verify (none) (none) Royston Bake, RN   04/08/2019 1049 New Bag/Given (none) Intravenous Parmele, Santiago Glad A, RN    0.9 %  sodium chloride infusion    Date Action Dose Route User   04/22/2019 1526 Rate/Dose Change (none) (none) Thomasene Lot, RN   04/22/2019 1523 Rate/Dose Change (none) (none) Thomasene Lot, RN   04/22/2019 1324 Rate/Dose Verify (none) (none) Thomasene Lot, RN   04/22/2019 1322 New Bag/Given (none) Intravenous Rolene Course, RN    sodium chloride flush (NS) 0.9 % injection 10 mL    Date Action Dose Route User   03/25/2019 1300 Given 10 mL Intravenous Murlean Hark H, LPN    sodium chloride flush (NS) 0.9 % injection 10 mL    Date Action Dose Route User   03/25/2019 1632 Given 10 mL Intracatheter Lester [place]asp[place], RN    sodium chloride flush (NS) 0.9 % injection 10 mL    Date Action Dose Route User   04/08/2019 0939 Given 10 mL Intravenous Murlean Hark H, LPN    sodium chloride flush (NS) 0.9 % injection 10 mL    Date Action Dose Route User   04/08/2019 1249 Given 10 mL Intracatheter Ihor Gully A, RN    sodium chloride flush (NS) 0.9 % injection 10 mL    Date Action Dose Route User   04/22/2019 1152 Given 10 mL Intracatheter Eubanks, Jason A, LPN    sodium chloride flush (NS) 0.9 % injection 10 mL  Date Action Dose Route User   04/22/2019 1529 Given 10 mL Intracatheter Ricks, Janalyn Harder, RN      No flowsheet data found.  No results found for: NITRICOXIDE      Assessment & Plan:   CAP (community acquired  pneumonia) Left lower lobe pneumonia clinically patient is improved with 7-day course of Levaquin.  Vital signs are stable and O2 saturations are good on 2 L of oxygen.  Patient is tolerating oral intake. Chest x-ray today has not changed much with left lower lobe consolidation-will continue to follow closely as he is immunosuppressed .  He will follow back in the office in 2 weeks with a chest x-ray.  If chest x-ray is unchanged we will consider a CT chest at that time.  Patient is advised if symptoms worsen or if he develops fever or is unable to eat he is to contact our office immediately.  Please contact office for sooner follow up if symptoms do not improve or worsen or seek emergency care    Chronic respiratory failure with hypoxia [facility]) Patient is continue on oxygen 2 L with activity and at bedtime.  Patient needs a portable device to be able to be more mobile and to go to medical appointments.  A POC order is placed today  Centrilobular emphysema ([place]) Will need PFTs going forward.  Currently stable without flare.     Rexene Edison, NP 04/23/2019

## 2019-04-23 NOTE — Progress Notes (Signed)
PCCM: Agree. Thanks for calling.  Garner Nash, DO [facility] 04/23/2019 3:51 PM

## 2019-04-29 DIAGNOSIS — J439 Emphysema, unspecified: Secondary | ICD-10-CM | POA: Diagnosis not present

## 2019-05-06 ENCOUNTER — Other Ambulatory Visit: Payer: Self-pay

## 2019-05-06 ENCOUNTER — Encounter: Payer: Self-pay | Admitting: Physician Assistant

## 2019-05-06 ENCOUNTER — Inpatient Hospital Stay: Payer: Medicare HMO | Attending: Internal Medicine

## 2019-05-06 ENCOUNTER — Inpatient Hospital Stay (HOSPITAL_BASED_OUTPATIENT_CLINIC_OR_DEPARTMENT_OTHER): Payer: Medicare HMO | Admitting: Physician Assistant

## 2019-05-06 ENCOUNTER — Inpatient Hospital Stay: Payer: Medicare HMO

## 2019-05-06 VITALS — BP 123/61 | HR 89 | Temp 98.0°F | Resp 18 | Ht 68.5 in | Wt 166.2 lb

## 2019-05-06 DIAGNOSIS — R5383 Other fatigue: Secondary | ICD-10-CM | POA: Diagnosis not present

## 2019-05-06 DIAGNOSIS — C3432 Malignant neoplasm of lower lobe, left bronchus or lung: Secondary | ICD-10-CM

## 2019-05-06 DIAGNOSIS — Z923 Personal history of irradiation: Secondary | ICD-10-CM | POA: Insufficient documentation

## 2019-05-06 DIAGNOSIS — K219 Gastro-esophageal reflux disease without esophagitis: Secondary | ICD-10-CM | POA: Insufficient documentation

## 2019-05-06 DIAGNOSIS — C3492 Malignant neoplasm of unspecified part of left bronchus or lung: Secondary | ICD-10-CM

## 2019-05-06 DIAGNOSIS — Z79899 Other long term (current) drug therapy: Secondary | ICD-10-CM | POA: Insufficient documentation

## 2019-05-06 DIAGNOSIS — J449 Chronic obstructive pulmonary disease, unspecified: Secondary | ICD-10-CM | POA: Insufficient documentation

## 2019-05-06 DIAGNOSIS — Z9221 Personal history of antineoplastic chemotherapy: Secondary | ICD-10-CM | POA: Insufficient documentation

## 2019-05-06 DIAGNOSIS — I10 Essential (primary) hypertension: Secondary | ICD-10-CM | POA: Diagnosis not present

## 2019-05-06 DIAGNOSIS — Z8701 Personal history of pneumonia (recurrent): Secondary | ICD-10-CM | POA: Diagnosis not present

## 2019-05-06 DIAGNOSIS — Z5112 Encounter for antineoplastic immunotherapy: Secondary | ICD-10-CM | POA: Diagnosis not present

## 2019-05-06 LAB — CBC WITH DIFFERENTIAL (CANCER CENTER ONLY)
Abs Immature Granulocytes: 0.02 10*3/uL (ref 0.00–0.07)
Basophils Absolute: 0.1 10*3/uL (ref 0.0–0.1)
Basophils Relative: 2 %
Eosinophils Absolute: 0.1 10*3/uL (ref 0.0–0.5)
Eosinophils Relative: 1 %
HCT: 37.3 % — ABNORMAL LOW (ref 39.0–52.0)
Hemoglobin: 12.1 g/dL — ABNORMAL LOW (ref 13.0–17.0)
Immature Granulocytes: 1 %
Lymphocytes Relative: 20 %
Lymphs Abs: 0.8 10*3/uL (ref 0.7–4.0)
MCH: 35.3 pg — ABNORMAL HIGH (ref 26.0–34.0)
MCHC: 32.4 g/dL (ref 30.0–36.0)
MCV: 108.7 fL — ABNORMAL HIGH (ref 80.0–100.0)
Monocytes Absolute: 0.5 10*3/uL (ref 0.1–1.0)
Monocytes Relative: 11 %
Neutro Abs: 2.8 10*3/uL (ref 1.7–7.7)
Neutrophils Relative %: 65 %
Platelet Count: 226 10*3/uL (ref 150–400)
RBC: 3.43 MIL/uL — ABNORMAL LOW (ref 4.22–5.81)
RDW: 14.6 % (ref 11.5–15.5)
WBC Count: 4.3 10*3/uL (ref 4.0–10.5)
nRBC: 0 % (ref 0.0–0.2)

## 2019-05-06 LAB — CMP (CANCER CENTER ONLY)
ALT: 13 U/L (ref 0–44)
AST: 16 U/L (ref 15–41)
Albumin: 3.7 g/dL (ref 3.5–5.0)
Alkaline Phosphatase: 108 U/L (ref 38–126)
Anion gap: 9 (ref 5–15)
BUN: 12 mg/dL (ref 8–23)
CO2: 23 mmol/L (ref 22–32)
Calcium: 8.7 mg/dL — ABNORMAL LOW (ref 8.9–10.3)
Chloride: 111 mmol/L (ref 98–111)
Creatinine: 1.03 mg/dL (ref 0.61–1.24)
GFR, Est AFR Am: >60 mL/min (ref >60)
GFR, Estimated: >60 mL/min (ref >60)
Glucose, Bld: 104 mg/dL — ABNORMAL HIGH (ref 70–99)
Potassium: 4.1 mmol/L (ref 3.5–5.1)
Sodium: 143 mmol/L (ref 135–145)
Total Bilirubin: 0.5 mg/dL (ref 0.3–1.2)
Total Protein: 6.5 g/dL (ref 6.5–8.1)

## 2019-05-06 MED ORDER — SODIUM CHLORIDE 0.9 % IV SOLN
Freq: Once | INTRAVENOUS | Status: AC
  Administered 2019-05-06: 16:00:00 via INTRAVENOUS
  Filled 2019-05-06: qty 250

## 2019-05-06 MED ORDER — SODIUM CHLORIDE 0.9% FLUSH
10.0000 mL | INTRAVENOUS | Status: DC | PRN
  Administered 2019-05-06: 10 mL
  Filled 2019-05-06: qty 10

## 2019-05-06 MED ORDER — SODIUM CHLORIDE 0.9 % IV SOLN
9.5000 mg/kg | Freq: Once | INTRAVENOUS | Status: AC
  Administered 2019-05-06: 740 mg via INTRAVENOUS
  Filled 2019-05-06: qty 10

## 2019-05-06 MED ORDER — HEPARIN SOD (PORK) LOCK FLUSH 100 UNIT/ML IV SOLN
500.0000 [IU] | Freq: Once | INTRAVENOUS | Status: AC | PRN
  Administered 2019-05-06: 500 [IU]
  Filled 2019-05-06: qty 5

## 2019-05-06 NOTE — Progress Notes (Signed)
[facility] OFFICE PROGRESS NOTE  [name], MD [place] [address] [place] [address]  DIAGNOSIS: Stage IIIB(T1b, N3, M0) non-small cell lung cancer favoring adenocarcinoma diagnosed in January 2020 and presented with left lower lobe pulmonary nodule in addition to bilateral hilar and subcarinal lymphadenopathy  PRIOR THERAPY: Concurrent chemoradiation with weekly carboplatin for an AUC of 2 and paclitaxel 45 mg/m2. Status post 5 cycles.  CURRENT THERAPY: Consolidation immunotherapy with Imfinzi 10 mg/kg IV every 2 weeks. First dose March 25, 2019.Status post 3 cycles.  INTERVAL HISTORY: Robert Dixon 78 y.o. male returns to the clinic for a follow-up visit. The patient is feeling well today without any concerning complaints  He was recently diagnosed with pneumonia last month and completed a course of antibiotics Levaquin.  He has a follow-up appointment with pulmonology on 05/16/2019.  Since being treated for pneumonia he feels better. He denies any fever, chills, night sweats, weight loss, chest pain, or hemoptysis.  The patient endorses his usual baseline shortness of breath and cough.  He is intermittently on supplemental oxygen via nasal cannula.  He tolerated his last treatment with immunotherapy well without any adverse effects.  He denies any nausea, vomiting, diarrhea, or constipation.  He denies any rashes or skin changes.  Denies any headaches or visual changes.  He is here today for evaluation before starting cycle #4.  MEDICAL HISTORY: Past Medical History:  Diagnosis Date  . Cancer ([place])    skin-   . Complication of anesthesia   . COPD (chronic obstructive pulmonary disease) ([place])   . Dyspnea   . GERD (gastroesophageal reflux disease)   . Hemoptysis 11/20/2018  . History of hiatal hernia   . Hypertension   . Hypoxia 11/20/2018  . 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 Migraine with visual aura   . Non-small cell lung cancer ([place]) dx'd  11/20/18  . Pneumonia 11/20/2018  . PONV (postoperative nausea and vomiting)   . Pulmonary nodule 11/20/2018    ALLERGIES:  has No Known Allergies.  MEDICATIONS:  Current Outpatient Medications  Medication Sig Dispense Refill  . albuterol (PROVENTIL HFA;VENTOLIN HFA) 108 (90 Base) MCG/ACT inhaler Inhale 2 puffs into the lungs every 6 (six) hours as needed for wheezing or shortness of breath. 1 Inhaler 6  . amLODipine (NORVASC) 10 MG tablet Take 5 mg by mouth daily.     . benazepril (LOTENSIN) 10 MG tablet Take 10 mg by mouth daily.    Marland Kitchen dimenhyDRINATE (DRAMAMINE) 50 MG tablet Take 50 mg by mouth every 8 (eight) hours as needed for dizziness.     Marland Kitchen HYDROcodone-homatropine (HYCODAN) 5-1.5 MG/5ML syrup Take 5 mLs by mouth every 6 (six) hours as needed for cough.     . lidocaine-prilocaine (EMLA) cream Apply 1 application topically as needed. 30 g 0  . pantoprazole (PROTONIX) 40 MG tablet Take 40 mg by mouth 2 (two) times daily.    . prochlorperazine (COMPAZINE) 10 MG tablet TAKE ONE TABLET BY MOUTH EVERY 6 HOURS AS NEEDED FOR NAUSEA OR VOMITING 30 tablet 0   No current facility-administered medications for this visit.     SURGICAL HISTORY:  Past Surgical History:  Procedure Laterality Date  . COLONOSCOPY W/ POLYPECTOMY    . IR IMAGING GUIDED PORT INSERTION  03/14/2019  . TONSILLECTOMY    . VASECTOMY    . VIDEO BRONCHOSCOPY WITH ENDOBRONCHIAL ULTRASOUND N/A 12/06/2018   Procedure: VIDEO BRONCHOSCOPY WITH ENDOBRONCHIAL ULTRASOUND;  Surgeon: Garner Nash, DO;  Location: [facility] OR;  Service: Thoracic;  Laterality: N/A;    REVIEW OF SYSTEMS:   Review of Systems  Constitutional: Negative for appetite change, chills, fatigue, fever and unexpected weight change.  HENT:   Negative for mouth sores, nosebleeds, sore throat and trouble swallowing.   Eyes: Negative for eye problems and icterus.  Respiratory: Negative for cough, hemoptysis, shortness of breath and wheezing.   Cardiovascular:  Negative for chest pain and leg swelling.  Gastrointestinal: Negative for abdominal pain, constipation, diarrhea, nausea and vomiting.  Genitourinary: Negative for bladder incontinence, difficulty urinating, dysuria, frequency and hematuria.   Musculoskeletal: Negative for back pain, gait problem, neck pain and neck stiffness.  Skin: Negative for itching and rash.  Neurological: Negative for dizziness, extremity weakness, gait problem, headaches, light-headedness and seizures.  Hematological: Negative for adenopathy. Does not bruise/bleed easily.  Psychiatric/Behavioral: Negative for confusion, depression and sleep disturbance. The patient is not nervous/anxious.     PHYSICAL EXAMINATION:  Blood pressure 123/61, pulse 89, temperature 98 F (36.7 C), temperature source Oral, resp. rate 18, height 5' 8.5" (1.74 m), weight 166 lb 3.2 oz (75.4 kg), SpO2 90 %.  ECOG PERFORMANCE STATUS: 1 - Symptomatic but completely ambulatory  Physical Exam  Constitutional: Oriented to person, place, and time and well-developed, well-nourished, and in no distress.  HENT:  Head: Normocephalic and atraumatic.  Mouth/Throat: Oropharynx is clear and moist. No oropharyngeal exudate.  Eyes: Conjunctivae are normal. Right eye exhibits no discharge. Left eye exhibits no discharge. No scleral icterus.  Neck: Normal range of motion. Neck supple.  Cardiovascular: Normal rate, regular rhythm, normal heart sounds and intact distal pulses.   Pulmonary/Chest: Effort normal and breath sounds normal. No respiratory distress. No wheezes. No rales.  Abdominal: Soft. Bowel sounds are normal. Exhibits no distension and no mass. There is no tenderness.  Musculoskeletal: Normal range of motion. Exhibits no edema.  Lymphadenopathy:    No cervical adenopathy.  Neurological: Alert and oriented to person, place, and time. Exhibits normal muscle tone. Gait normal. Coordination normal.  Skin: Skin is warm and dry. No rash noted. Not  diaphoretic. No erythema. No pallor.  Psychiatric: Mood, memory and judgment normal.  Vitals reviewed.  LABORATORY DATA: Lab Results  Component Value Date   WBC 4.3 05/06/2019   HGB 12.1 (L) 05/06/2019   HCT 37.3 (L) 05/06/2019   MCV 108.7 (H) 05/06/2019   PLT 226 05/06/2019      Chemistry      Component Value Date/Time   NA 143 05/06/2019 1422   K 4.1 05/06/2019 1422   CL 111 05/06/2019 1422   CO2 23 05/06/2019 1422   BUN 12 05/06/2019 1422   CREATININE 1.03 05/06/2019 1422      Component Value Date/Time   CALCIUM 8.7 (L) 05/06/2019 1422   ALKPHOS 108 05/06/2019 1422   AST 16 05/06/2019 1422   ALT 13 05/06/2019 1422   BILITOT 0.5 05/06/2019 1422       RADIOGRAPHIC STUDIES:  Dg Chest 2 View  Result Date: 04/23/2019 CLINICAL DATA:  Follow-up pneumonia. EXAM: CHEST - 2 VIEW COMPARISON:  04/15/2019 FINDINGS: The cardiac silhouette, mediastinal and hilar contours are within normal limits and stable. Stable right IJ power port. Persistent left lower lobe airspace process consistent with pneumonia. No definite pleural effusion. Stable underlying emphysematous changes and pulmonary scarring. The bony thorax is intact. IMPRESSION: Persistent left lower lobe airspace process consistent with pneumonia. Stable underlying emphysematous changes and pulmonary scarring. Electronically Signed   By: Marijo Sanes  M.D.   On: 04/23/2019 11:41   Dg Chest 2 View  Result Date: 04/15/2019 CLINICAL DATA:  Shortness of breath EXAM: CHEST - 2 VIEW COMPARISON:  11/11/2018 FINDINGS: There is consolidation involving the left lower lobe and likely the lingula as well. There are findings of emphysema and COPD. Road well-positioned right-sided Port-A-Cath. There is scarring versus atelectasis in the right mid lung zone. There is no pneumothorax or large pleural effusion. IMPRESSION: 1. Left lower lobe pneumonia. 2. Well-positioned right-sided Port-A-Cath. 3. Emphysematous changes. Electronically Signed    By: Constance Holster M.D.   On: 04/15/2019 14:37     ASSESSMENT/PLAN:  This is a very pleasant 78 year old Caucasian male diagnosed with stage IIIb non-small cell lung cancer, adenocarcinoma of the left lower lobe. He was diagnosed in January 2020. He has no actionable mutations.  The patient underwent concurrent chemoradiation with carboplatin for an AUC of 2 and paclitaxel 45 mg/m. He is status post 5 cycles. He was unable to proceed with cycle #6 secondary to thrombocytopenia.   The patient is currently undergoing consolidation immunotherapy with Imfinzi 10 mg/kg IV every 2 weeks. Status post 3 cycles. He tolerated his first treatment well without any adverse side effects.  The patient was seen with Dr. Julien Nordmann today. Labs were reviewed with the patient. We recommend that he proceed with cycle #4today as scheduled.  We will see him back for follow-up visit in 2 weeks for evaluation before starting cycle #5.   The patient will follow-up with pulmonology as planned on May 16, 2019. The patient was advised to call immediately if he has any concerning symptoms in the interval. The patient voices understanding of current disease status and treatment options and is in agreement with the current care plan. All questions were answered. The patient knows to call the clinic with any problems, questions or concerns. We can certainly see the patient much sooner if necessary  No orders of the defined types were placed in this encounter.    Koda Defrank L Katurah Karapetian, PA-C 05/06/19  ADDENDUM: Hematology/Oncology Attending: I had a face-to-face encounter with the patient today.  I recommended his care plan.  This is a very pleasant 78 years old white male with a stage IIIb non-small cell lung cancer, adenocarcinoma status post a course of concurrent chemoradiation with weekly carboplatin and paclitaxel he is currently undergoing consolidation treatment with immunotherapy with Imfinzi  status post 3 cycles.  The patient continues to tolerate this treatment well. I recommended for him to proceed with cycle #4 today. I will see him back for follow-up visit in weeks for evaluation before starting cycle #5. He was advised to call immediately if he has any concerning symptoms in the interval.  Disclaimer: This note was dictated with voice recognition software. Similar sounding words can inadvertently be transcribed and may be missed upon review. Eilleen Kempf, MD 05/06/19

## 2019-05-06 NOTE — Patient Instructions (Signed)
[facility] Discharge Instructions for Patients Receiving Chemotherapy  Today you received the following chemotherapy agents Imfinzi  To help prevent nausea and vomiting after your treatment, we encourage you to take your nausea medication As directed by MD.   If you develop nausea and vomiting that is not controlled by your nausea medication, call the clinic.   BELOW ARE SYMPTOMS THAT SHOULD BE REPORTED IMMEDIATELY:  *FEVER GREATER THAN 100.5 F  *CHILLS WITH OR WITHOUT FEVER  NAUSEA AND VOMITING THAT IS NOT CONTROLLED WITH YOUR NAUSEA MEDICATION  *UNUSUAL SHORTNESS OF BREATH  *UNUSUAL BRUISING OR BLEEDING  TENDERNESS IN MOUTH AND THROAT WITH OR WITHOUT PRESENCE OF ULCERS  *URINARY PROBLEMS  *BOWEL PROBLEMS  UNUSUAL RASH Items with * indicate a potential emergency and should be followed up as soon as possible.  Feel free to call the clinic should you have any questions or concerns. The clinic phone number is [phone].  Please show the [place] at check-in to the Emergency Department and triage nurse.

## 2019-05-08 ENCOUNTER — Ambulatory Visit: Payer: Medicare HMO | Admitting: Pulmonary Disease

## 2019-05-16 ENCOUNTER — Other Ambulatory Visit: Payer: Self-pay

## 2019-05-16 ENCOUNTER — Encounter: Payer: Self-pay | Admitting: Adult Health

## 2019-05-16 ENCOUNTER — Ambulatory Visit (INDEPENDENT_AMBULATORY_CARE_PROVIDER_SITE_OTHER): Payer: Medicare HMO | Admitting: Adult Health

## 2019-05-16 ENCOUNTER — Ambulatory Visit (INDEPENDENT_AMBULATORY_CARE_PROVIDER_SITE_OTHER): Payer: Medicare HMO

## 2019-05-16 VITALS — BP 110/60 | HR 99 | Temp 98.1°F | Ht 68.5 in | Wt 164.6 lb

## 2019-05-16 DIAGNOSIS — J181 Lobar pneumonia, unspecified organism: Secondary | ICD-10-CM

## 2019-05-16 DIAGNOSIS — J449 Chronic obstructive pulmonary disease, unspecified: Secondary | ICD-10-CM | POA: Diagnosis not present

## 2019-05-16 DIAGNOSIS — Z452 Encounter for adjustment and management of vascular access device: Secondary | ICD-10-CM | POA: Diagnosis not present

## 2019-05-16 DIAGNOSIS — J189 Pneumonia, unspecified organism: Secondary | ICD-10-CM

## 2019-05-16 DIAGNOSIS — J9811 Atelectasis: Secondary | ICD-10-CM | POA: Diagnosis not present

## 2019-05-16 DIAGNOSIS — I700 Atherosclerosis of aorta: Secondary | ICD-10-CM | POA: Diagnosis not present

## 2019-05-16 NOTE — Assessment & Plan Note (Signed)
Persistent left lower lobe consolidation clinically patient appears stable recent CBC showed no elevated white count.  He has no acute symptoms of discolored mucus or fever.  Patient completed a full course of Levaquin.  Dense consolidation of the left lower lobe has not changed over the last 3 weeks.  We will set patient up for CT chest.  Concerned that this may be related to underlying malignancy and/or atypical infection as patient is immunosuppressed. Check sputum culture, sputum fungal and sputum AFB.  Check strep pneumonia urinary antigen and Legionella urinary antigen. Set up for CT chest with contrast Will discuss case with .[name].  Notes sent to Oncology .    Plan  Patient Instructions  Check CT chest.  Sputum culture .  Labs today .  Mucinex DM Twice daily As needed  Cough/congestion  Oxygen 2l/m with activity .  Continue on Oxygen At bedtime  .  Order for POC .  Albuterol inhaler 2 puffs every 6hr as needed.  Follow in 2-3  week with [name] or Kanisha Duba NP with chest xray and As needed   Please contact office for sooner follow up if symptoms do not improve or worsen or seek emergency care

## 2019-05-16 NOTE — Patient Instructions (Addendum)
Check CT chest.  Sputum culture .  Labs today .  Mucinex DM Twice daily As needed  Cough/congestion  Oxygen 2l/m with activity .  Continue on Oxygen At bedtime  .  Order for POC .  Albuterol inhaler 2 puffs every 6hr as needed.  Follow in 2-3  week with [name] or Jamilex Bohnsack NP with chest xray and As needed   Please contact office for sooner follow up if symptoms do not improve or worsen or seek emergency care

## 2019-05-16 NOTE — Progress Notes (Signed)
@Patient  ID: Robert Dixon, male    DOB: 1940/11/28, 78 y.o.   MRN: [id number]  Chief Complaint  Patient presents with  . Follow-up    PNA     Referring provider: [name], MD  HPI: 78 year old male former smoker seen for pulmonary consult January 2020 for a left hilar mass.  CT chest December 2019 showed a 4.4 cm left hilar mass with narrowing of the left upper lobe and left lower lobe bronchi along with positive lymphadenopathy.  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  Patient was seen by oncology and started on palonosetron, carboplatin and Taxol.  He underwent radiation therapy.  He is currently on immunotherapy with Imfinzi .  MRI was negative for metastatic disease.  TEST/EVENTS :  02/28/2019 CT Chest Severe Emphysema No new or progressive disease within the thorax. Near complete resolution of left hilar and mediastinal lymphadenopathy. Interval resolution of left lower lobe pulmonary nodule since previous study  PET Scan 5/95/6387 Hypermetabolic mediastinal and left hilar adenopathy. Differential considerations include small-cell lung cancer or a lymphoproliferative process such as lymphoma. Reticulonodular lingular and central left lower lobe opacities are likely related to postobstructive pneumonitis from left hilar adenopathy. Hypermetabolic subpleural left lower lobe pulmonary nodule which could represent a primary bronchogenic carcinoma (i.e. Small-cell lung cancer can present in this way) or also be related to postobstructive pneumonitis. No hypermetabolic extrathoracic metastasis. Aortic atherosclerosis (ICD10-I70.0), coronary artery atherosclerosis and emphysema (ICD10-J43.9). Indeterminate left renal lesions which could represent complex cysts or solid neoplasms. Consider nonemergent pre and post contrast abdominal MRI (preferred) or CT.  Low-level hypermetabolism within both adrenal glands, without well-defined dominant mass. Favored to be physiologic. Recommend attention on follow-up.  12/13/2018 MR Brain>> evidence of metastatic disease.  Atrophy and chronic small-vessel ischemic changes.  12/06/2018: BRONCHIAL BRUSHING (B) LEFT UPPER LOBE BRUSHING (SPECIMEN 3 OF 3, COLLECTED ON 12/06/18): MALIGNANT CELLS CONSISTENT WITH NON SMALL CELL CARCINOMA 12/06/2018 FINE NEEDLE ASPIRATION, ENDOSCOPIC (A) 7 NODE (SPECIMEN 1 OF 3 COLLECTED 12/06/2018) MALIGNANT CELLS CONSISTENT WITH NON-SMALL CELL CARCINOMA.  adenocarcinoma with no actionable mutations and currently undergoing a  CT chest 11/20/2018: Left hilar mass with associated adenopathy endobronchial narrowing, subcarinal and bilateral hilar adenopathy. Ill-defined small density within the liver possible focus of metastatic disease.  05/16/2019 Follow up : PNA, Lung Cancer and Emphysema  Patient presents for a 3-week follow-up.  Patient was recently seen with increased cough and shortness of breath.  Chest x-ray showed a left lower lobe consolidation consistent with pneumonia.  As above patient has stage IIIb non-small cell lung cancer and is undergoing immunotherapy with Imfinzi . Last dose 05/06/19 .  He has finished his antibiotic course.  Patient says he is feeling  chest x-ray today shows  Chest x-ray today shows no improvement in LLL consolidation .   No Known Allergies  Immunization History  Administered Date(s) Administered  . Influenza, High Dose Seasonal PF 09/04/2018    Past Medical History:  Diagnosis Date  . Cancer ([place])    skin-   . Complication of anesthesia   . COPD (chronic obstructive pulmonary disease) ([place])   . Dyspnea   . GERD (gastroesophageal reflux disease)   . Hemoptysis 11/20/2018  . History of hiatal hernia   . Hypertension   . Hypoxia 11/20/2018  . 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 Migraine with visual aura   . Non-small  cell lung  cancer ([place]) dx'd 11/20/18  . Pneumonia 11/20/2018  . PONV (postoperative nausea and vomiting)   . Pulmonary nodule 11/20/2018    Tobacco History: Social History   Tobacco Use  Smoking Status Former Smoker  . Years: 30.00  . Types: Cigarettes  . Quit date: 2000  . Years since quitting: 20.4  Smokeless Tobacco Never Used   Counseling given: Not Answered   Outpatient Medications Prior to Visit  Medication Sig Dispense Refill  . albuterol (PROVENTIL HFA;VENTOLIN HFA) 108 (90 Base) MCG/ACT inhaler Inhale 2 puffs into the lungs every 6 (six) hours as needed for wheezing or shortness of breath. 1 Inhaler 6  . amLODipine (NORVASC) 10 MG tablet Take 5 mg by mouth daily.     . benazepril (LOTENSIN) 10 MG tablet Take 10 mg by mouth daily.    Marland Kitchen dimenhyDRINATE (DRAMAMINE) 50 MG tablet Take 50 mg by mouth every 8 (eight) hours as needed for dizziness.     Marland Kitchen HYDROcodone-homatropine (HYCODAN) 5-1.5 MG/5ML syrup Take 5 mLs by mouth every 6 (six) hours as needed for cough.     . lidocaine-prilocaine (EMLA) cream Apply 1 application topically as needed. 30 g 0  . pantoprazole (PROTONIX) 40 MG tablet Take 40 mg by mouth 2 (two) times daily.    . prochlorperazine (COMPAZINE) 10 MG tablet TAKE ONE TABLET BY MOUTH EVERY 6 HOURS AS NEEDED FOR NAUSEA OR VOMITING 30 tablet 0   No facility-administered medications prior to visit.      Review of Systems:   Constitutional:   No  weight loss, night sweats,  Fevers, chills, fatigue, or  lassitude.  HEENT:   No headaches,  Difficulty swallowing,  Tooth/dental problems, or  Sore throat,                No sneezing, itching, ear ache, nasal congestion, post nasal drip,   CV:  No chest pain,  Orthopnea, PND, swelling in lower extremities, anasarca, dizziness, palpitations, syncope.   GI  No heartburn, indigestion, abdominal pain, nausea, vomiting, diarrhea, change in bowel habits, loss of appetite, bloody stools.   Resp: No shortness of breath  with exertion or at rest.  No excess mucus, no productive cough,  No non-productive cough,  No coughing up of blood.  No change in color of mucus.  No wheezing.  No chest wall deformity  Skin: no rash or lesions.  GU: no dysuria, change in color of urine, no urgency or frequency.  No flank pain, no hematuria   MS:  No joint pain or swelling.  No decreased range of motion.  No back pain.    Physical Exam  BP 110/60   Pulse 99   Temp 98.1 F (36.7 C) (Oral)   Ht 5' 8.5" (1.74 m)   Wt 164 lb 9.6 oz (74.7 kg)   SpO2 93%   BMI 24.66 kg/m   GEN: A/Ox3; pleasant , NAD, well nourished    HEENT:  [place]/AT,  EACs-clear, TMs-wnl, NOSE-clear, THROAT-clear, no lesions, no postnasal drip or exudate noted.   NECK:  Supple w/ fair ROM; no JVD; normal carotid impulses w/o bruits; no thyromegaly or nodules palpated; no lymphadenopathy.    RESP  Clear  P & A; w/o, wheezes/ rales/ or rhonchi. no accessory muscle use, no dullness to percussion  CARD:  RRR, no m/r/g, no peripheral edema, pulses intact, no cyanosis or clubbing.  GI:   Soft & nt; nml bowel sounds; no organomegaly or masses detected.   Musco: Warm bil,  no deformities or joint swelling noted.   Neuro: alert, no focal deficits noted.    Skin: Warm, no lesions or rashes    Lab Results:  CBC    Component Value Date/Time   WBC 4.3 05/06/2019 1422   WBC 6.5 03/14/2019 0929   RBC 3.43 (L) 05/06/2019 1422   HGB 12.1 (L) 05/06/2019 1422   HCT 37.3 (L) 05/06/2019 1422   PLT 226 05/06/2019 1422   MCV 108.7 (H) 05/06/2019 1422   MCH 35.3 (H) 05/06/2019 1422   MCHC 32.4 05/06/2019 1422   RDW 14.6 05/06/2019 1422   LYMPHSABS 0.8 05/06/2019 1422   MONOABS 0.5 05/06/2019 1422   EOSABS 0.1 05/06/2019 1422   BASOSABS 0.1 05/06/2019 1422    BMET    Component Value Date/Time   NA 143 05/06/2019 1422   K 4.1 05/06/2019 1422   CL 111 05/06/2019 1422   CO2 23 05/06/2019 1422   GLUCOSE 104 (H) 05/06/2019 1422   BUN 12 05/06/2019  1422   CREATININE 1.03 05/06/2019 1422   CALCIUM 8.7 (L) 05/06/2019 1422   GFRNONAA >60 05/06/2019 1422   GFRAA >60 05/06/2019 1422    BNP No results found for: BNP  ProBNP No results found for: PROBNP  Imaging: Dg Chest 2 View  Result Date: 05/16/2019 CLINICAL DATA:  Follow-up pneumonia EXAM: CHEST - 2 VIEW COMPARISON:  04/23/2019 FINDINGS: RIGHT jugular Port-A-Cath with tip projecting over SVC. Normal heart size, mediastinal contours, and pulmonary vascularity. Atherosclerotic calcification aorta. Persistent LEFT basilar infiltrate consistent with LEFT lower lobe pneumonia. Minimal subsegmental atelectasis at RIGHT base. Underlying emphysematous changes. No pleural effusion or pneumothorax. Bones demineralized. IMPRESSION: Persistent LEFT lower lobe pneumonia. COPD changes with mild RIGHT basilar atelectasis. Electronically Signed   By: Lavonia Dana M.D.   On: 05/16/2019 15:21   Dg Chest 2 View  Result Date: 04/23/2019 CLINICAL DATA:  Follow-up pneumonia. EXAM: CHEST - 2 VIEW COMPARISON:  04/15/2019 FINDINGS: The cardiac silhouette, mediastinal and hilar contours are within normal limits and stable. Stable right IJ power port. Persistent left lower lobe airspace process consistent with pneumonia. No definite pleural effusion. Stable underlying emphysematous changes and pulmonary scarring. The bony thorax is intact. IMPRESSION: Persistent left lower lobe airspace process consistent with pneumonia. Stable underlying emphysematous changes and pulmonary scarring. Electronically Signed   By: Marijo Sanes M.D.   On: 04/23/2019 11:41    durvalumab (IMFINZI) 740 mg in sodium chloride 0.9 % 100 mL chemo infusion    Date Action Dose Route User   03/25/2019 1626 Rate/Dose Change (none) (none) Lester Perry, [place]   03/25/2019 1526 Rate/Dose Change (none) (none) Lester Keddie, RN   03/25/2019 1526 New Bag/Given 740 mg Intravenous [place], Loren M, RN    durvalumab (IMFINZI) 740 mg in sodium  chloride 0.9 % 100 mL chemo infusion    Date Action Dose Route User   04/08/2019 1238 Rate/Dose Change (none) (none) Royston Bake, [place]   04/08/2019 1237 Rate/Dose Change (none) (none) Royston Bake, [place]   04/08/2019 1137 Rate/Dose Change (none) (none) Royston Bake, [place]   04/08/2019 1137 New Bag/Given 740 mg Intravenous Poindexter, Macario Carls, RN    durvalumab (IMFINZI) 740 mg in sodium chloride 0.9 % 100 mL chemo infusion    Date Action Dose Route User   04/22/2019 1507 Rate/Dose Verify (none) (none) Thomasene Lot, RN   04/22/2019 1414 Rate/Dose Verify (none) (none) Thomasene Lot, RN   04/22/2019 1414 New Bag/Given 740 mg Intravenous  Ricks, Janalyn Harder, RN    durvalumab (IMFINZI) 740 mg in sodium chloride 0.9 % 100 mL chemo infusion    Date Action Dose Route User   05/06/2019 1702 Rate/Dose Verify (none) (none) Manuella Ghazi, RN   05/06/2019 1616 Rate/Dose Verify (none) (none) Manuella Ghazi, RN   05/06/2019 1615 Rate/Dose Change (none) (none) Manuella Ghazi, RN   05/06/2019 1615 New Bag/Given 740 mg Intravenous Levada Schilling, RN    heparin lock flush 100 unit/mL    Date Action Dose Route User   03/25/2019 1632 Given 500 Units Intracatheter Lester Coolidge, RN    heparin lock flush 100 unit/mL    Date Action Dose Route User   04/08/2019 1249 Given 500 Units Intracatheter [place], Deliah Goody, RN    heparin lock flush 100 unit/mL    Date Action Dose Route User   04/22/2019 1529 Given 500 Units Intracatheter Ricks, Joelene Millin A, RN    heparin lock flush 100 unit/mL    Date Action Dose Route User   05/06/2019 1728 Given 500 Units Intracatheter [place], Morley Kos, RN    0.9 %  sodium chloride infusion    Date Action Dose Route User   03/25/2019 1631 Rate/Dose Change (none) (none) Lester [place], RN   03/25/2019 1627 Rate/Dose Change (none) (none) Lester [place], RN   03/25/2019 1435 New Bag/Given (none) Intravenous Tonye Royalty M, RN    0.9 %  sodium chloride infusion    Date Action Dose Route  User   04/08/2019 1245 Rate/Dose Change (none) (none) Royston Bake, [place]   04/08/2019 1241 Rate/Dose Change (none) (none) Royston Bake, RN   04/08/2019 1053 Rate/Dose Verify (none) (none) Royston Bake, RN   04/08/2019 1049 New Bag/Given (none) Intravenous [place], Santiago Glad A, RN    0.9 %  sodium chloride infusion    Date Action Dose Route User   04/22/2019 1526 Rate/Dose Change (none) (none) Thomasene Lot, RN   04/22/2019 1523 Rate/Dose Change (none) (none) Thomasene Lot, RN   04/22/2019 1324 Rate/Dose Verify (none) (none) Thomasene Lot, RN   04/22/2019 1322 New Bag/Given (none) Intravenous Rolene Course, RN    0.9 %  sodium chloride infusion    Date Action Dose Route User   05/06/2019 1726 Rate/Dose Change (none) (none) Manuella Ghazi, RN   05/06/2019 1721 Rate/Dose Change (none) (none) Manuella Ghazi, RN   05/06/2019 1614 Rate/Dose Verify (none) (none) Manuella Ghazi, RN   05/06/2019 1612 New Bag/Given (none) Intravenous Levada Schilling, RN    sodium chloride flush (NS) 0.9 % injection 10 mL    Date Action Dose Route User   03/25/2019 1300 Given 10 mL Intravenous Murlean Hark H, LPN    sodium chloride flush (NS) 0.9 % injection 10 mL    Date Action Dose Route User   03/25/2019 1632 Given 10 mL Intracatheter Lester Angus, RN    sodium chloride flush (NS) 0.9 % injection 10 mL    Date Action Dose Route User   04/08/2019 0939 Given 10 mL Intravenous Murlean Hark H, LPN    sodium chloride flush (NS) 0.9 % injection 10 mL    Date Action Dose Route User   04/08/2019 1249 Given 10 mL Intracatheter Ihor Gully A, RN    sodium chloride flush (NS) 0.9 % injection 10 mL    Date Action Dose Route User   04/22/2019 1152 Given 10 mL Intracatheter Kasandra Knudsen A, LPN    sodium  chloride flush (NS) 0.9 % injection 10 mL    Date Action Dose Route User   04/22/2019 1529 Given 10 mL Intracatheter Ricks, Kimberly A, RN    sodium chloride flush (NS) 0.9 % injection 10 mL    Date Action  Dose Route User   05/06/2019 1727 Given 10 mL Intracatheter Manuella Ghazi, RN      No flowsheet data found.  No results found for: NITRICOXIDE      Assessment & Plan:   CAP (community acquired pneumonia) Persistent left lower lobe consolidation clinically patient appears stable recent CBC showed no elevated white count.  He has no acute symptoms of discolored mucus or fever.  Patient completed a full course of Levaquin.  Dense consolidation of the left lower lobe has not changed over the last 3 weeks.  We will set patient up for CT chest.  Concerned that this may be related to underlying malignancy and/or atypical infection as patient is immunosuppressed. Check sputum culture, sputum fungal and sputum AFB.  Check strep pneumonia urinary antigen and Legionella urinary antigen. Set up for CT chest with contrast Will discuss case with .[name].  Notes sent to Oncology .    Plan  Patient Instructions  Check CT chest.  Sputum culture .  Labs today .  Mucinex DM Twice daily As needed  Cough/congestion  Oxygen 2l/m with activity .  Continue on Oxygen At bedtime  .  Order for POC .  Albuterol inhaler 2 puffs every 6hr as needed.  Follow in 2-3  week with [name] or Garnett Rekowski NP with chest xray and As needed   Please contact office for sooner follow up if symptoms do not improve or worsen or seek emergency care           Rexene Edison, NP 05/16/2019

## 2019-05-19 ENCOUNTER — Telehealth: Payer: Self-pay | Admitting: Adult Health

## 2019-05-19 ENCOUNTER — Telehealth: Payer: Self-pay | Admitting: Pulmonary Disease

## 2019-05-19 NOTE — Telephone Encounter (Signed)
SPoke with pt, I advised her to call [facility] to see if they could schedule him sooner for CT. She understood and would call them to see if they can move up the CT. Nothing further is needed

## 2019-05-19 NOTE — Telephone Encounter (Signed)
Spoke with the pt  He states that Tammy had wanted him to let her know the date that he was going to be having his CT Chest  He is scheduled to have it this Friday 05/23/2019  Forwarding to TP as FYI

## 2019-05-20 ENCOUNTER — Inpatient Hospital Stay: Payer: Medicare HMO

## 2019-05-20 ENCOUNTER — Encounter: Payer: Self-pay | Admitting: Internal Medicine

## 2019-05-20 ENCOUNTER — Other Ambulatory Visit: Payer: Self-pay | Admitting: Medical Oncology

## 2019-05-20 ENCOUNTER — Other Ambulatory Visit: Payer: Self-pay

## 2019-05-20 ENCOUNTER — Inpatient Hospital Stay (HOSPITAL_BASED_OUTPATIENT_CLINIC_OR_DEPARTMENT_OTHER): Payer: Medicare HMO | Admitting: Internal Medicine

## 2019-05-20 ENCOUNTER — Other Ambulatory Visit: Payer: Medicare HMO

## 2019-05-20 VITALS — BP 112/60 | HR 87 | Temp 98.9°F | Resp 18 | Ht 68.5 in | Wt 165.0 lb

## 2019-05-20 DIAGNOSIS — I10 Essential (primary) hypertension: Secondary | ICD-10-CM | POA: Diagnosis not present

## 2019-05-20 DIAGNOSIS — Z5112 Encounter for antineoplastic immunotherapy: Secondary | ICD-10-CM

## 2019-05-20 DIAGNOSIS — K219 Gastro-esophageal reflux disease without esophagitis: Secondary | ICD-10-CM

## 2019-05-20 DIAGNOSIS — Z9221 Personal history of antineoplastic chemotherapy: Secondary | ICD-10-CM | POA: Diagnosis not present

## 2019-05-20 DIAGNOSIS — C3432 Malignant neoplasm of lower lobe, left bronchus or lung: Secondary | ICD-10-CM | POA: Diagnosis not present

## 2019-05-20 DIAGNOSIS — Z8701 Personal history of pneumonia (recurrent): Secondary | ICD-10-CM | POA: Diagnosis not present

## 2019-05-20 DIAGNOSIS — J449 Chronic obstructive pulmonary disease, unspecified: Secondary | ICD-10-CM | POA: Diagnosis not present

## 2019-05-20 DIAGNOSIS — Z79899 Other long term (current) drug therapy: Secondary | ICD-10-CM | POA: Diagnosis not present

## 2019-05-20 DIAGNOSIS — J181 Lobar pneumonia, unspecified organism: Secondary | ICD-10-CM | POA: Diagnosis not present

## 2019-05-20 DIAGNOSIS — C3492 Malignant neoplasm of unspecified part of left bronchus or lung: Secondary | ICD-10-CM

## 2019-05-20 DIAGNOSIS — R5383 Other fatigue: Secondary | ICD-10-CM

## 2019-05-20 DIAGNOSIS — Z923 Personal history of irradiation: Secondary | ICD-10-CM | POA: Diagnosis not present

## 2019-05-20 DIAGNOSIS — J189 Pneumonia, unspecified organism: Secondary | ICD-10-CM

## 2019-05-20 DIAGNOSIS — Z95828 Presence of other vascular implants and grafts: Secondary | ICD-10-CM

## 2019-05-20 LAB — CBC WITH DIFFERENTIAL (CANCER CENTER ONLY)
Abs Immature Granulocytes: 0.02 10*3/uL (ref 0.00–0.07)
Basophils Absolute: 0.1 10*3/uL (ref 0.0–0.1)
Basophils Relative: 2 %
Eosinophils Absolute: 0.1 10*3/uL (ref 0.0–0.5)
Eosinophils Relative: 1 %
HCT: 34.9 % — ABNORMAL LOW (ref 39.0–52.0)
Hemoglobin: 11.7 g/dL — ABNORMAL LOW (ref 13.0–17.0)
Immature Granulocytes: 1 %
Lymphocytes Relative: 18 %
Lymphs Abs: 0.8 10*3/uL (ref 0.7–4.0)
MCH: 35.8 pg — ABNORMAL HIGH (ref 26.0–34.0)
MCHC: 33.5 g/dL (ref 30.0–36.0)
MCV: 106.7 fL — ABNORMAL HIGH (ref 80.0–100.0)
Monocytes Absolute: 0.4 10*3/uL (ref 0.1–1.0)
Monocytes Relative: 9 %
Neutro Abs: 3.1 10*3/uL (ref 1.7–7.7)
Neutrophils Relative %: 69 %
Platelet Count: 225 10*3/uL (ref 150–400)
RBC: 3.27 MIL/uL — ABNORMAL LOW (ref 4.22–5.81)
RDW: 15.4 % (ref 11.5–15.5)
WBC Count: 4.4 10*3/uL (ref 4.0–10.5)
nRBC: 0 % (ref 0.0–0.2)

## 2019-05-20 LAB — CMP (CANCER CENTER ONLY)
ALT: 16 U/L (ref 0–44)
AST: 16 U/L (ref 15–41)
Albumin: 3.7 g/dL (ref 3.5–5.0)
Alkaline Phosphatase: 108 U/L (ref 38–126)
Anion gap: 8 (ref 5–15)
BUN: 15 mg/dL (ref 8–23)
CO2: 22 mmol/L (ref 22–32)
Calcium: 8.8 mg/dL — ABNORMAL LOW (ref 8.9–10.3)
Chloride: 110 mmol/L (ref 98–111)
Creatinine: 1.03 mg/dL (ref 0.61–1.24)
GFR, Est AFR Am: >60 mL/min (ref >60)
GFR, Estimated: >60 mL/min (ref >60)
Glucose, Bld: 125 mg/dL — ABNORMAL HIGH (ref 70–99)
Potassium: 4.3 mmol/L (ref 3.5–5.1)
Sodium: 140 mmol/L (ref 135–145)
Total Bilirubin: 0.5 mg/dL (ref 0.3–1.2)
Total Protein: 6.4 g/dL — ABNORMAL LOW (ref 6.5–8.1)

## 2019-05-20 LAB — TSH: TSH: 2.153 u[IU]/mL (ref 0.320–4.118)

## 2019-05-20 MED ORDER — SODIUM CHLORIDE 0.9% FLUSH
10.0000 mL | INTRAVENOUS | Status: AC | PRN
  Filled 2019-05-20: qty 10

## 2019-05-20 MED ORDER — HEPARIN SOD (PORK) LOCK FLUSH 100 UNIT/ML IV SOLN
500.0000 [IU] | Freq: Once | INTRAVENOUS | Status: AC | PRN
  Administered 2019-05-20: 500 [IU]
  Filled 2019-05-20: qty 5

## 2019-05-20 MED ORDER — SODIUM CHLORIDE 0.9% FLUSH
10.0000 mL | INTRAVENOUS | Status: DC | PRN
  Administered 2019-05-20: 10 mL
  Filled 2019-05-20: qty 10

## 2019-05-20 MED ORDER — SODIUM CHLORIDE 0.9 % IV SOLN
740.0000 mg | Freq: Once | INTRAVENOUS | Status: AC
  Administered 2019-05-20: 740 mg via INTRAVENOUS
  Filled 2019-05-20: qty 10

## 2019-05-20 MED ORDER — SODIUM CHLORIDE 0.9% FLUSH
10.0000 mL | INTRAVENOUS | Status: AC | PRN
  Administered 2019-05-20: 10 mL
  Filled 2019-05-20: qty 10

## 2019-05-20 MED ORDER — SODIUM CHLORIDE 0.9 % IV SOLN
Freq: Once | INTRAVENOUS | Status: AC
  Administered 2019-05-20: 14:00:00 via INTRAVENOUS
  Filled 2019-05-20: qty 250

## 2019-05-20 NOTE — Telephone Encounter (Signed)
Ok thanks 

## 2019-05-20 NOTE — Progress Notes (Signed)
Robert Dixon [phone]   Fax:([phone]  OFFICE PROGRESS NOTE  [name], MD [address] [place] [address]  DIAGNOSIS: Stage IIIB (T1b, N3, M0) non-small cell lung cancer favoring adenocarcinoma diagnosed in January 2020 and presented with left lower lobe pulmonary nodule in addition to bilateral hilar and subcarinal lymphadenopathy.  Molecular studies by [facility]ant 360 showed no actionable mutations.  PRIOR THERAPY: Concurrent chemoradiation with weekly carboplatin for AUC of 2 and paclitaxel 45 mg/M2. First dose February 4th, 2020. Status post 5 cycles.   CURRENT THERAPY:  Consolidation immunotherapy with Imfinzi 10 mg/KG every 2 weeks, status post 4 cycles.  INTERVAL HISTORY: Robert Dixon 78 y.o. male presents to the clinic today for follow-up visit.  The patient is feeling fine today with no concerning complaints except for the baseline shortness of breath increased with exertion.  He was seen recently by [name] started on inhalers.  He denied having any current chest pain but has mild cough with no hemoptysis.  He denied having any fever or chills.  He has no nausea, diarrhea or constipation.  He continues to tolerate his treatment with Imfinzi fairly well.  The patient is here today for evaluation before starting cycle #5.  MEDICAL HISTORY: Past Medical History:  Diagnosis Date  . Cancer ([place])    skin-   . Complication of anesthesia   . COPD (chronic obstructive pulmonary disease) ([place])   . Dyspnea   . GERD (gastroesophageal reflux disease)   . Hemoptysis 11/20/2018  . History of hiatal hernia   . Hypertension   . Hypoxia 11/20/2018  . 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 Migraine with visual aura   . Non-small cell lung cancer ([place]) dx'd 11/20/18  . Pneumonia 11/20/2018  . PONV (postoperative nausea and vomiting)   . Pulmonary nodule 11/20/2018    ALLERGIES:  has No Known Allergies.  MEDICATIONS:   Current Outpatient Medications  Medication Sig Dispense Refill  . albuterol (PROVENTIL HFA;VENTOLIN HFA) 108 (90 Base) MCG/ACT inhaler Inhale 2 puffs into the lungs every 6 (six) hours as needed for wheezing or shortness of breath. 1 Inhaler 6  . amLODipine (NORVASC) 10 MG tablet Take 5 mg by mouth daily.     . benazepril (LOTENSIN) 10 MG tablet Take 10 mg by mouth daily.    Marland Kitchen dimenhyDRINATE (DRAMAMINE) 50 MG tablet Take 50 mg by mouth every 8 (eight) hours as needed for dizziness.     Marland Kitchen HYDROcodone-homatropine (HYCODAN) 5-1.5 MG/5ML syrup Take 5 mLs by mouth every 6 (six) hours as needed for cough.     . lidocaine-prilocaine (EMLA) cream Apply 1 application topically as needed. 30 g 0  . pantoprazole (PROTONIX) 40 MG tablet Take 40 mg by mouth 2 (two) times daily.    . prochlorperazine (COMPAZINE) 10 MG tablet TAKE ONE TABLET BY MOUTH EVERY 6 HOURS AS NEEDED FOR NAUSEA OR VOMITING 30 tablet 0   No current facility-administered medications for this visit.    Facility-Administered Medications Ordered in Other Visits  Medication Dose Route Frequency Provider Last Rate Last Dose  . sodium chloride flush (NS) 0.9 % injection 10 mL  10 mL Intracatheter PRN [name], MD      . sodium chloride flush (NS) 0.9 % injection 10 mL  10 mL Intracatheter PRN [name], MD   10 mL at 05/20/19 1232    SURGICAL HISTORY:  Past Surgical History:  Procedure Laterality Date  .  COLONOSCOPY W/ POLYPECTOMY    . IR IMAGING GUIDED PORT INSERTION  03/14/2019  . TONSILLECTOMY    . VASECTOMY    . VIDEO BRONCHOSCOPY WITH ENDOBRONCHIAL ULTRASOUND N/A 12/06/2018   Procedure: VIDEO BRONCHOSCOPY WITH ENDOBRONCHIAL ULTRASOUND;  Surgeon: Garner Nash, DO;  Location: [facility] OR;  Service: Thoracic;  Laterality: N/A;    REVIEW OF SYSTEMS:  A comprehensive review of systems was negative except for: Constitutional: positive for fatigue Respiratory: positive for dyspnea on exertion   PHYSICAL EXAMINATION:  General appearance: alert, cooperative, fatigued and no distress Head: Normocephalic, without obvious abnormality, atraumatic Neck: no adenopathy, no JVD, supple, symmetrical, trachea midline and thyroid not enlarged, symmetric, no tenderness/mass/nodules Lymph nodes: Cervical, supraclavicular, and axillary nodes normal. Resp: clear to auscultation bilaterally Back: symmetric, no curvature. ROM normal. No CVA tenderness. Cardio: regular rate and rhythm, S1, S2 normal, no murmur, click, rub or gallop GI: soft, non-tender; bowel sounds normal; no masses,  no organomegaly Extremities: extremities normal, atraumatic, no cyanosis or edema  ECOG PERFORMANCE STATUS: 1 - Symptomatic but completely ambulatory  Blood pressure 112/60, pulse 87, temperature 98.9 F (37.2 C), temperature source Oral, resp. rate 18, height 5' 8.5" (1.74 m), weight 165 lb (74.8 kg), SpO2 94 %.  LABORATORY DATA: Lab Results  Component Value Date   WBC 4.3 05/06/2019   HGB 12.1 (L) 05/06/2019   HCT 37.3 (L) 05/06/2019   MCV 108.7 (H) 05/06/2019   PLT 226 05/06/2019      Chemistry      Component Value Date/Time   NA 143 05/06/2019 1422   K 4.1 05/06/2019 1422   CL 111 05/06/2019 1422   CO2 23 05/06/2019 1422   BUN 12 05/06/2019 1422   CREATININE 1.03 05/06/2019 1422      Component Value Date/Time   CALCIUM 8.7 (L) 05/06/2019 1422   ALKPHOS 108 05/06/2019 1422   AST 16 05/06/2019 1422   ALT 13 05/06/2019 1422   BILITOT 0.5 05/06/2019 1422       RADIOGRAPHIC STUDIES: Dg Chest 2 View  Result Date: 05/16/2019 CLINICAL DATA:  Follow-up pneumonia EXAM: CHEST - 2 VIEW COMPARISON:  04/23/2019 FINDINGS: RIGHT jugular Port-A-Cath with tip projecting over SVC. Normal heart size, mediastinal contours, and pulmonary vascularity. Atherosclerotic calcification aorta. Persistent LEFT basilar infiltrate consistent with LEFT lower lobe pneumonia. Minimal subsegmental atelectasis at RIGHT base. Underlying emphysematous  changes. No pleural effusion or pneumothorax. Bones demineralized. IMPRESSION: Persistent LEFT lower lobe pneumonia. COPD changes with mild RIGHT basilar atelectasis. Electronically Signed   By: Lavonia Dana M.D.   On: 05/16/2019 15:21   Dg Chest 2 View  Result Date: 04/23/2019 CLINICAL DATA:  Follow-up pneumonia. EXAM: CHEST - 2 VIEW COMPARISON:  04/15/2019 FINDINGS: The cardiac silhouette, mediastinal and hilar contours are within normal limits and stable. Stable right IJ power port. Persistent left lower lobe airspace process consistent with pneumonia. No definite pleural effusion. Stable underlying emphysematous changes and pulmonary scarring. The bony thorax is intact. IMPRESSION: Persistent left lower lobe airspace process consistent with pneumonia. Stable underlying emphysematous changes and pulmonary scarring. Electronically Signed   By: Marijo Sanes M.D.   On: 04/23/2019 11:41    ASSESSMENT AND PLAN: This is a very pleasant 78 years old white male diagnosed with stage IIIb non-small cell lung cancer, adenocarcinoma with no actionable mutations  Completed the course of concurrent chemoradiation with weekly carboplatin and paclitaxel status post 5 cycles.  He has partial response to this treatment. The patient is currently undergoing consolidation treatment  with immunotherapy with Imfinzi status post 4 cycles.  He has been tolerating this treatment well but has persistent shortness of breath likely secondary to COPD but radiation-induced immunotherapy induced pneumonitis could not be excluded.  He was evaluated by [name] than expected to have repeat CT scan of the chest later this week. I recommended for the patient to proceed with cycle #5 today as planned. If the CT scan showed no concerning finding for immunotherapy mediated pneumonitis, he will continue his treatment and will receive cycle #6 in 2 weeks. The patient was advised to call immediately if he has any concerning symptoms in the  interval. The patient voices understanding of current disease status and treatment options and is in agreement with the current care plan.  All questions were answered. The patient knows to call the clinic with any problems, questions or concerns. We can certainly see the patient much sooner if necessary.  Disclaimer: This note was dictated with voice recognition software. Similar sounding words can inadvertently be transcribed and may not be corrected upon review.

## 2019-05-20 NOTE — Patient Instructions (Signed)
[facility] Discharge Instructions for Patients Receiving Chemotherapy  Today you received the following chemotherapy agents Imfinzi  To help prevent nausea and vomiting after your treatment, we encourage you to take your nausea medication As directed by MD.   If you develop nausea and vomiting that is not controlled by your nausea medication, call the clinic.   BELOW ARE SYMPTOMS THAT SHOULD BE REPORTED IMMEDIATELY:  *FEVER GREATER THAN 100.5 F  *CHILLS WITH OR WITHOUT FEVER  NAUSEA AND VOMITING THAT IS NOT CONTROLLED WITH YOUR NAUSEA MEDICATION  *UNUSUAL SHORTNESS OF BREATH  *UNUSUAL BRUISING OR BLEEDING  TENDERNESS IN MOUTH AND THROAT WITH OR WITHOUT PRESENCE OF ULCERS  *URINARY PROBLEMS  *BOWEL PROBLEMS  UNUSUAL RASH Items with * indicate a potential emergency and should be followed up as soon as possible.  Feel free to call the clinic should you have any questions or concerns. The clinic phone number is [phone].  Please show the [facility] at check-in to the Emergency Department and triage nurse.

## 2019-05-23 ENCOUNTER — Encounter (HOSPITAL_COMMUNITY): Payer: Self-pay

## 2019-05-23 ENCOUNTER — Other Ambulatory Visit: Payer: Self-pay

## 2019-05-23 ENCOUNTER — Ambulatory Visit (HOSPITAL_COMMUNITY)
Admission: RE | Admit: 2019-05-23 | Discharge: 2019-05-23 | Disposition: A | Discharge status: Home / Self Care | Payer: Medicare HMO | Source: Ambulatory Visit | Attending: Adult Health | Admitting: Adult Health

## 2019-05-23 DIAGNOSIS — J90 Pleural effusion, not elsewhere classified: Secondary | ICD-10-CM | POA: Diagnosis not present

## 2019-05-23 DIAGNOSIS — J189 Pneumonia, unspecified organism: Secondary | ICD-10-CM

## 2019-05-23 DIAGNOSIS — J181 Lobar pneumonia, unspecified organism: Secondary | ICD-10-CM | POA: Insufficient documentation

## 2019-05-23 DIAGNOSIS — Z85118 Personal history of other malignant neoplasm of bronchus and lung: Secondary | ICD-10-CM | POA: Diagnosis not present

## 2019-05-23 MED ORDER — SODIUM CHLORIDE (PF) 0.9 % IJ SOLN
INTRAMUSCULAR | Status: AC
  Filled 2019-05-23: qty 50

## 2019-05-23 MED ORDER — IOHEXOL 300 MG/ML  SOLN
75.0000 mL | Freq: Once | INTRAMUSCULAR | Status: AC | PRN
  Administered 2019-05-23: 75 mL via INTRAVENOUS

## 2019-05-24 LAB — LEGIONELLA ANTIGEN, URINE: Legionella Antigen, Urine: NOT DETECTED

## 2019-05-24 LAB — STREP PNEUMONIAE URINARY ANTIGEN: Strep Pneumo Urinary Antigen: NOT DETECTED

## 2019-05-29 DIAGNOSIS — J439 Emphysema, unspecified: Secondary | ICD-10-CM | POA: Diagnosis not present

## 2019-06-02 ENCOUNTER — Ambulatory Visit (HOSPITAL_COMMUNITY): Payer: Medicare HMO

## 2019-06-02 ENCOUNTER — Telehealth: Payer: Self-pay | Admitting: Medical Oncology

## 2019-06-02 NOTE — Telephone Encounter (Signed)
Confirmed appts.

## 2019-06-03 ENCOUNTER — Inpatient Hospital Stay: Payer: Medicare HMO

## 2019-06-03 ENCOUNTER — Other Ambulatory Visit: Payer: Self-pay

## 2019-06-03 ENCOUNTER — Inpatient Hospital Stay: Payer: Medicare HMO | Attending: Internal Medicine

## 2019-06-03 ENCOUNTER — Inpatient Hospital Stay (HOSPITAL_BASED_OUTPATIENT_CLINIC_OR_DEPARTMENT_OTHER): Payer: Medicare HMO | Admitting: Internal Medicine

## 2019-06-03 ENCOUNTER — Encounter: Payer: Self-pay | Admitting: Internal Medicine

## 2019-06-03 ENCOUNTER — Other Ambulatory Visit: Payer: Medicare HMO

## 2019-06-03 VITALS — BP 112/64 | HR 94 | Temp 99.1°F | Resp 18 | Ht 68.5 in | Wt 164.7 lb

## 2019-06-03 DIAGNOSIS — R5383 Other fatigue: Secondary | ICD-10-CM | POA: Insufficient documentation

## 2019-06-03 DIAGNOSIS — K219 Gastro-esophageal reflux disease without esophagitis: Secondary | ICD-10-CM | POA: Diagnosis not present

## 2019-06-03 DIAGNOSIS — I10 Essential (primary) hypertension: Secondary | ICD-10-CM

## 2019-06-03 DIAGNOSIS — C3432 Malignant neoplasm of lower lobe, left bronchus or lung: Secondary | ICD-10-CM | POA: Diagnosis not present

## 2019-06-03 DIAGNOSIS — Z79899 Other long term (current) drug therapy: Secondary | ICD-10-CM | POA: Diagnosis not present

## 2019-06-03 DIAGNOSIS — Z9221 Personal history of antineoplastic chemotherapy: Secondary | ICD-10-CM | POA: Insufficient documentation

## 2019-06-03 DIAGNOSIS — Z95828 Presence of other vascular implants and grafts: Secondary | ICD-10-CM

## 2019-06-03 DIAGNOSIS — R197 Diarrhea, unspecified: Secondary | ICD-10-CM | POA: Insufficient documentation

## 2019-06-03 DIAGNOSIS — C3492 Malignant neoplasm of unspecified part of left bronchus or lung: Secondary | ICD-10-CM

## 2019-06-03 DIAGNOSIS — Z923 Personal history of irradiation: Secondary | ICD-10-CM

## 2019-06-03 DIAGNOSIS — R109 Unspecified abdominal pain: Secondary | ICD-10-CM | POA: Diagnosis not present

## 2019-06-03 DIAGNOSIS — J449 Chronic obstructive pulmonary disease, unspecified: Secondary | ICD-10-CM

## 2019-06-03 DIAGNOSIS — Z5112 Encounter for antineoplastic immunotherapy: Secondary | ICD-10-CM | POA: Diagnosis not present

## 2019-06-03 LAB — CBC WITH DIFFERENTIAL (CANCER CENTER ONLY)
Abs Immature Granulocytes: 0.02 10*3/uL (ref 0.00–0.07)
Basophils Absolute: 0.1 10*3/uL (ref 0.0–0.1)
Basophils Relative: 2 %
Eosinophils Absolute: 0 10*3/uL (ref 0.0–0.5)
Eosinophils Relative: 1 %
HCT: 32.4 % — ABNORMAL LOW (ref 39.0–52.0)
Hemoglobin: 10.9 g/dL — ABNORMAL LOW (ref 13.0–17.0)
Immature Granulocytes: 0 %
Lymphocytes Relative: 19 %
Lymphs Abs: 0.9 10*3/uL (ref 0.7–4.0)
MCH: 37.3 pg — ABNORMAL HIGH (ref 26.0–34.0)
MCHC: 33.6 g/dL (ref 30.0–36.0)
MCV: 111 fL — ABNORMAL HIGH (ref 80.0–100.0)
Monocytes Absolute: 0.6 10*3/uL (ref 0.1–1.0)
Monocytes Relative: 13 %
Neutro Abs: 2.9 10*3/uL (ref 1.7–7.7)
Neutrophils Relative %: 65 %
Platelet Count: 222 10*3/uL (ref 150–400)
RBC: 2.92 MIL/uL — ABNORMAL LOW (ref 4.22–5.81)
RDW: 16.5 % — ABNORMAL HIGH (ref 11.5–15.5)
WBC Count: 4.5 10*3/uL (ref 4.0–10.5)
nRBC: 0 % (ref 0.0–0.2)

## 2019-06-03 LAB — CMP (CANCER CENTER ONLY)
ALT: 17 U/L (ref 0–44)
AST: 23 U/L (ref 15–41)
Albumin: 3.7 g/dL (ref 3.5–5.0)
Alkaline Phosphatase: 104 U/L (ref 38–126)
Anion gap: 10 (ref 5–15)
BUN: 18 mg/dL (ref 8–23)
CO2: 21 mmol/L — ABNORMAL LOW (ref 22–32)
Calcium: 8.2 mg/dL — ABNORMAL LOW (ref 8.9–10.3)
Chloride: 112 mmol/L — ABNORMAL HIGH (ref 98–111)
Creatinine: 1.06 mg/dL (ref 0.61–1.24)
GFR, Est AFR Am: >60 mL/min (ref >60)
GFR, Estimated: >60 mL/min (ref >60)
Glucose, Bld: 88 mg/dL (ref 70–99)
Potassium: 4.3 mmol/L (ref 3.5–5.1)
Sodium: 143 mmol/L (ref 135–145)
Total Bilirubin: 0.7 mg/dL (ref 0.3–1.2)
Total Protein: 6.3 g/dL — ABNORMAL LOW (ref 6.5–8.1)

## 2019-06-03 MED ORDER — SODIUM CHLORIDE 0.9% FLUSH
10.0000 mL | INTRAVENOUS | Status: DC | PRN
  Administered 2019-06-03: 10 mL
  Filled 2019-06-03: qty 10

## 2019-06-03 MED ORDER — SODIUM CHLORIDE 0.9 % IV SOLN
9.5000 mg/kg | Freq: Once | INTRAVENOUS | Status: AC
  Administered 2019-06-03: 740 mg via INTRAVENOUS
  Filled 2019-06-03: qty 10

## 2019-06-03 MED ORDER — HEPARIN SOD (PORK) LOCK FLUSH 100 UNIT/ML IV SOLN
500.0000 [IU] | Freq: Once | INTRAVENOUS | Status: AC | PRN
  Administered 2019-06-03: 500 [IU]
  Filled 2019-06-03: qty 5

## 2019-06-03 MED ORDER — SODIUM CHLORIDE 0.9 % IV SOLN
Freq: Once | INTRAVENOUS | Status: AC
  Administered 2019-06-03: 13:00:00 via INTRAVENOUS
  Filled 2019-06-03: qty 250

## 2019-06-03 NOTE — Progress Notes (Signed)
[facility] Telephone:([phone]   [phone]  OFFICE PROGRESS NOTE  [name], MD [address] [place] [address]  DIAGNOSIS: Stage IIIB (T1b, N3, M0) non-small cell lung cancer favoring adenocarcinoma diagnosed in January 2020 and presented with left lower lobe pulmonary nodule in addition to bilateral hilar and subcarinal lymphadenopathy.  Molecular studies by guardant 360 showed no actionable mutations.  PRIOR THERAPY: Concurrent chemoradiation with weekly carboplatin for AUC of 2 and paclitaxel 45 mg/M2. First dose February 4th, 2020. Status post 5 cycles.   CURRENT THERAPY:  Consolidation immunotherapy with Imfinzi 10 mg/KG every 2 weeks, status post 5 cycles.  INTERVAL HISTORY: Robert Dixon 78 y.o. male returns to the clinic today for follow-up visit.  The patient is feeling fine today with no concerning complaints except for mild shortness of breath and abdominal discomfort.  He denied having any nausea, vomiting, diarrhea or constipation.  He denied having any chest pain, cough or hemoptysis.  He has no fever or chills.  He denied having any headache or visual changes.  He has been tolerating his treatment with immunotherapy fairly well.  He had repeat CT scan of the chest performed recently and he is here for evaluation and discussion of his scan results before cycle #6.  MEDICAL HISTORY: Past Medical History:  Diagnosis Date  . Cancer ([place])    skin-   . Complication of anesthesia   . COPD (chronic obstructive pulmonary disease) ([place])   . Dyspnea   . GERD (gastroesophageal reflux disease)   . Hemoptysis 11/20/2018  . History of hiatal hernia   . Hypertension   . Hypoxia 11/20/2018  . 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 Migraine with visual aura   . Non-small cell lung cancer ([place]) dx'd 11/20/18  . Pneumonia 11/20/2018  . PONV (postoperative nausea and vomiting)   . Pulmonary nodule 11/20/2018    ALLERGIES:   has No Known Allergies.  MEDICATIONS:  Current Outpatient Medications  Medication Sig Dispense Refill  . albuterol (PROVENTIL HFA;VENTOLIN HFA) 108 (90 Base) MCG/ACT inhaler Inhale 2 puffs into the lungs every 6 (six) hours as needed for wheezing or shortness of breath. 1 Inhaler 6  . amLODipine (NORVASC) 10 MG tablet Take 5 mg by mouth daily.     . benazepril (LOTENSIN) 10 MG tablet Take 10 mg by mouth daily.    Marland Kitchen dimenhyDRINATE (DRAMAMINE) 50 MG tablet Take 50 mg by mouth every 8 (eight) hours as needed for dizziness.     Marland Kitchen HYDROcodone-homatropine (HYCODAN) 5-1.5 MG/5ML syrup Take 5 mLs by mouth every 6 (six) hours as needed for cough.     . lidocaine-prilocaine (EMLA) cream Apply 1 application topically as needed. 30 g 0  . pantoprazole (PROTONIX) 40 MG tablet Take 40 mg by mouth 2 (two) times daily.    . prochlorperazine (COMPAZINE) 10 MG tablet TAKE ONE TABLET BY MOUTH EVERY 6 HOURS AS NEEDED FOR NAUSEA OR VOMITING 30 tablet 0   No current facility-administered medications for this visit.    Facility-Administered Medications Ordered in Other Visits  Medication Dose Route Frequency Provider Last Rate Last Dose  . sodium chloride flush (NS) 0.9 % injection 10 mL  10 mL Intracatheter PRN [name], MD      . sodium chloride flush (NS) 0.9 % injection 10 mL  10 mL Intracatheter PRN [name], MD   10 mL at 05/20/19 1232  . sodium chloride flush (NS) 0.9 %  injection 10 mL  10 mL Intracatheter PRN [name], MD   10 mL at 06/03/19 1149    SURGICAL HISTORY:  Past Surgical History:  Procedure Laterality Date  . COLONOSCOPY W/ POLYPECTOMY    . IR IMAGING GUIDED PORT INSERTION  03/14/2019  . TONSILLECTOMY    . VASECTOMY    . VIDEO BRONCHOSCOPY WITH ENDOBRONCHIAL ULTRASOUND N/A 12/06/2018   Procedure: VIDEO BRONCHOSCOPY WITH ENDOBRONCHIAL ULTRASOUND;  Surgeon: Garner Nash, DO;  Location: [facility] OR;  Service: Thoracic;  Laterality: N/A;    REVIEW OF SYSTEMS:   Constitutional: positive for fatigue Eyes: negative Ears, nose, mouth, throat, and face: negative Respiratory: positive for dyspnea on exertion Cardiovascular: negative Gastrointestinal: negative Genitourinary:negative Integument/breast: negative Hematologic/lymphatic: negative Musculoskeletal:negative Neurological: negative Behavioral/Psych: negative Endocrine: negative Allergic/Immunologic: negative   PHYSICAL EXAMINATION: General appearance: alert, cooperative, fatigued and no distress Head: Normocephalic, without obvious abnormality, atraumatic Neck: no adenopathy, no JVD, supple, symmetrical, trachea midline and thyroid not enlarged, symmetric, no tenderness/mass/nodules Lymph nodes: Cervical, supraclavicular, and axillary nodes normal. Resp: clear to auscultation bilaterally Back: symmetric, no curvature. ROM normal. No CVA tenderness. Cardio: regular rate and rhythm, S1, S2 normal, no murmur, click, rub or gallop GI: soft, non-tender; bowel sounds normal; no masses,  no organomegaly Extremities: extremities normal, atraumatic, no cyanosis or edema Neurologic: Alert and oriented X 3, normal strength and tone. Normal symmetric reflexes. Normal coordination and gait  ECOG PERFORMANCE STATUS: 1 - Symptomatic but completely ambulatory  Blood pressure 112/64, pulse 94, temperature 99.1 F (37.3 C), temperature source Oral, resp. rate 18, height 5' 8.5" (1.74 m), weight 164 lb 11.2 oz (74.7 kg), SpO2 93 %.  LABORATORY DATA: Lab Results  Component Value Date   WBC 4.4 05/20/2019   HGB 11.7 (L) 05/20/2019   HCT 34.9 (L) 05/20/2019   MCV 106.7 (H) 05/20/2019   PLT 225 05/20/2019      Chemistry      Component Value Date/Time   NA 140 05/20/2019 1139   K 4.3 05/20/2019 1139   CL 110 05/20/2019 1139   CO2 22 05/20/2019 1139   BUN 15 05/20/2019 1139   CREATININE 1.03 05/20/2019 1139      Component Value Date/Time   CALCIUM 8.8 (L) 05/20/2019 1139   ALKPHOS 108 05/20/2019  1139   AST 16 05/20/2019 1139   ALT 16 05/20/2019 1139   BILITOT 0.5 05/20/2019 1139       RADIOGRAPHIC STUDIES: Dg Chest 2 View  Result Date: 05/16/2019 CLINICAL DATA:  Follow-up pneumonia EXAM: CHEST - 2 VIEW COMPARISON:  04/23/2019 FINDINGS: RIGHT jugular Port-A-Cath with tip projecting over SVC. Normal heart size, mediastinal contours, and pulmonary vascularity. Atherosclerotic calcification aorta. Persistent LEFT basilar infiltrate consistent with LEFT lower lobe pneumonia. Minimal subsegmental atelectasis at RIGHT base. Underlying emphysematous changes. No pleural effusion or pneumothorax. Bones demineralized. IMPRESSION: Persistent LEFT lower lobe pneumonia. COPD changes with mild RIGHT basilar atelectasis. Electronically Signed   By: Lavonia Dana M.D.   On: 05/16/2019 15:21   Ct Chest W Contrast  Result Date: 05/23/2019 CLINICAL DATA:  Pneumonia x 2 months. History of lung cancer, status post chemotherapy and XRT EXAM: CT CHEST WITH CONTRAST TECHNIQUE: Multidetector CT imaging of the chest was performed during intravenous contrast administration. CONTRAST:  79mL OMNIPAQUE IOHEXOL 300 MG/ML  SOLN COMPARISON:  Chest radiographs dated 05/16/2019. CT chest dated 02/28/2019. FINDINGS: Cardiovascular: The heart is normal in size. Trace pericardial fluid. No evidence of thoracic aortic aneurysm. Atherosclerotic calcifications aortic root/arch. Three vessel coronary atherosclerosis,  LAD predominant. Right chest port terminates in the lower SVC. Mediastinum/Nodes: Small mediastinal lymph nodes which do not meet pathologic CT size criteria, including a 13 mm short axis subcarinal node, grossly unchanged. Visualized thyroid is unremarkable. Lungs/Pleura: Radiation changes in the lingula and left lower lobe, likely accounting for the radiographic abnormality. Additional radiation changes medially in the right lower lobe. Small left pleural effusion, new/progressive from prior CT. Moderate centrilobular and  paraseptal emphysematous changes, upper lobe predominant. No pneumothorax. Upper Abdomen: Visualized upper abdomen is notable for hepatic cysts, left renal cysts, and vascular calcifications. Musculoskeletal: Mild degenerative changes of the lower thoracic/upper lumbar spine. IMPRESSION: Radiation changes, predominantly in the lingula and left lower lobe, accounting for the radiographic abnormality. No evidence of pneumonia. 13 mm short axis subcarinal node, within the upper limits of normal, grossly unchanged. Continued attention on follow-up is suggested. Small left pleural effusion. No findings specific for recurrent or metastatic disease. Aortic Atherosclerosis (ICD10-I70.0) and Emphysema (ICD10-J43.9). Electronically Signed   By: Julian Hy M.D.   On: 05/23/2019 08:45    ASSESSMENT AND PLAN: This is a very pleasant 78 years old white male diagnosed with stage IIIb non-small cell lung cancer, adenocarcinoma with no actionable mutations  Completed the course of concurrent chemoradiation with weekly carboplatin and paclitaxel status post 5 cycles.  He has partial response to this treatment. The patient is currently undergoing consolidation treatment with immunotherapy with Imfinzi status post 5 cycles.   The patient has been tolerating this treatment well with no concerning adverse effects. He had repeat CT scan of the chest performed recently.  I personally and independently reviewed the scans and discussed the results with the patient today. Has a scan showed no concerning findings for disease progression. I recommended for the patient to continue his current treatment with Imfinzi and he will proceed with cycle #6 today. For the COPD, he is followed by pulmonary medicine. I will see him back for follow-up visit in 2 weeks for evaluation before starting cycle #7. The patient was advised to call immediately if he has any concerning symptoms in the interval. The patient voices understanding of  current disease status and treatment options and is in agreement with the current care plan.  All questions were answered. The patient knows to call the clinic with any problems, questions or concerns. We can certainly see the patient much sooner if necessary.  Disclaimer: This note was dictated with voice recognition software. Similar sounding words can inadvertently be transcribed and may not be corrected upon review.

## 2019-06-03 NOTE — Patient Instructions (Signed)
[facility] Discharge Instructions for Patients Receiving Chemotherapy  Today you received the following chemotherapy agents: Durvalumab (Imfinzi)  To help prevent nausea and vomiting after your treatment, we encourage you to take your nausea medication as directed.   If you develop nausea and vomiting that is not controlled by your nausea medication, call the clinic.   BELOW ARE SYMPTOMS THAT SHOULD BE REPORTED IMMEDIATELY:  *FEVER GREATER THAN 100.5 F  *CHILLS WITH OR WITHOUT FEVER  NAUSEA AND VOMITING THAT IS NOT CONTROLLED WITH YOUR NAUSEA MEDICATION  *UNUSUAL SHORTNESS OF BREATH  *UNUSUAL BRUISING OR BLEEDING  TENDERNESS IN MOUTH AND THROAT WITH OR WITHOUT PRESENCE OF ULCERS  *URINARY PROBLEMS  *BOWEL PROBLEMS  UNUSUAL RASH Items with * indicate a potential emergency and should be followed up as soon as possible.  Feel free to call the clinic should you have any questions or concerns. The clinic phone number is [phone].  Please show the [facility] at check-in to the Emergency Department and triage nurse.  Coronavirus (COVID-19) Are you at risk?  Are you at risk for the Coronavirus (COVID-19)?  To be considered HIGH RISK for Coronavirus (COVID-19), you have to meet the following criteria:  . Traveled to [place], [place], [place], [place] or [place]; or in the [place] to [place], [place], [place], or [place]; and have fever, cough, and shortness of breath within the last 2 weeks of travel OR . Been in close contact with a person diagnosed with COVID-19 within the last 2 weeks and have fever, cough, and shortness of breath . IF YOU DO NOT MEET THESE CRITERIA, YOU ARE CONSIDERED LOW RISK FOR COVID-19.  What to do if you are HIGH RISK for COVID-19?  Marland Kitchen If you are having a medical emergency, call 911. . Seek medical care right away. Before you go to a doctor's office, urgent care or emergency department, call ahead and tell them  about your recent travel, contact with someone diagnosed with COVID-19, and your symptoms. You should receive instructions from your physician's office regarding next steps of care.  . When you arrive at healthcare provider, tell the healthcare staff immediately you have returned from visiting [place], [place], [place], [place] or [place]; or traveled in the [place] to [place], [place], [place], or [place]; in the last two weeks or you have been in close contact with a person diagnosed with COVID-19 in the last 2 weeks.   . Tell the health care staff about your symptoms: fever, cough and shortness of breath. . After you have been seen by a medical provider, you will be either: o Tested for (COVID-19) and discharged home on quarantine except to seek medical care if symptoms worsen, and asked to  - Stay home and avoid contact with others until you get your results (4-5 days)  - Avoid travel on public transportation if possible (such as bus, train, or airplane) or o Sent to the Emergency Department by EMS for evaluation, COVID-19 testing, and possible admission depending on your condition and test results.  What to do if you are LOW RISK for COVID-19?  Reduce your risk of any infection by using the same precautions used for avoiding the common cold or flu:  Marland Kitchen Wash your hands often with soap and warm water for at least 20 seconds.  If soap and water are not readily available, use an alcohol-based hand sanitizer with at least 60% alcohol.  . If coughing or  sneezing, cover your mouth and nose by coughing or sneezing into the elbow areas of your shirt or coat, into a tissue or into your sleeve (not your hands). . Avoid shaking hands with others and consider head nods or verbal greetings only. . Avoid touching your eyes, nose, or mouth with unwashed hands.  . Avoid close contact with people who are sick. . Avoid places or events with large numbers of people in one location, like concerts or  sporting events. . Carefully consider travel plans you have or are making. . If you are planning any travel outside or inside the [place], visit the CDC's Travelers' Health webpage for the latest health notices. . If you have some symptoms but not all symptoms, continue to monitor at home and seek medical attention if your symptoms worsen. . If you are having a medical emergency, call 911.   [place] [facility] / e-Visit: [email or web address]         [facility]: [facility]: [phone]                   [facility]: [phone]

## 2019-06-04 ENCOUNTER — Telehealth: Payer: Self-pay | Admitting: Internal Medicine

## 2019-06-04 NOTE — Telephone Encounter (Signed)
Scheduled appt per 7/07 los - pt to get an updated schedule next visit.

## 2019-06-05 ENCOUNTER — Ambulatory Visit: Payer: Medicare HMO | Admitting: Internal Medicine

## 2019-06-10 ENCOUNTER — Telehealth: Payer: Self-pay | Admitting: Adult Health

## 2019-06-10 NOTE — Telephone Encounter (Signed)
Returned call to patient who states he received a bill for 211.27 for his 66. He says [employer] not paying for the month of April. [id number]. Community message sent to [employer] to check into. Will keep open.

## 2019-06-10 NOTE — Telephone Encounter (Signed)
[place], Stanford Breed, Lenna Sciara; Kino Dunsworth, Su Monks, CMA; Julian Hy Samuel [place]        I have emailed the billing department to call the patient in regards to this.

## 2019-06-11 NOTE — Telephone Encounter (Signed)
Sonya, please advise if you have received any other update from [place] in regards to the message you sent her. Thanks!

## 2019-06-11 NOTE — Telephone Encounter (Signed)
Left detailed message for Robert Dixon with [employer] to call office. Called patient and he has not heard anything from the billing dept. Pt is worried that they will come pick up his oxygen. Will await call back from [facility].

## 2019-06-12 NOTE — Telephone Encounter (Signed)
Called Melissa again this am. She will send another message to billing team to reach out to patient. Due to patient having [employer] she feels like bill came from the merger between [employer]. Expressed patient's concern that he didn't won't to have to worry about someone coming to his home to p/u his oxygen. Pt vebalized understanding and will await call from [employer]. Nothing further needed at this time.

## 2019-06-17 ENCOUNTER — Inpatient Hospital Stay (HOSPITAL_BASED_OUTPATIENT_CLINIC_OR_DEPARTMENT_OTHER): Payer: Medicare HMO | Admitting: Internal Medicine

## 2019-06-17 ENCOUNTER — Encounter: Payer: Self-pay | Admitting: Internal Medicine

## 2019-06-17 ENCOUNTER — Inpatient Hospital Stay: Payer: Medicare HMO

## 2019-06-17 ENCOUNTER — Other Ambulatory Visit: Payer: Self-pay

## 2019-06-17 VITALS — BP 103/51 | HR 84 | Temp 99.2°F | Resp 16 | Ht 68.5 in | Wt 163.9 lb

## 2019-06-17 DIAGNOSIS — R5383 Other fatigue: Secondary | ICD-10-CM | POA: Diagnosis not present

## 2019-06-17 DIAGNOSIS — R109 Unspecified abdominal pain: Secondary | ICD-10-CM | POA: Diagnosis not present

## 2019-06-17 DIAGNOSIS — R197 Diarrhea, unspecified: Secondary | ICD-10-CM

## 2019-06-17 DIAGNOSIS — Z5112 Encounter for antineoplastic immunotherapy: Secondary | ICD-10-CM

## 2019-06-17 DIAGNOSIS — K219 Gastro-esophageal reflux disease without esophagitis: Secondary | ICD-10-CM | POA: Diagnosis not present

## 2019-06-17 DIAGNOSIS — Z9221 Personal history of antineoplastic chemotherapy: Secondary | ICD-10-CM

## 2019-06-17 DIAGNOSIS — I10 Essential (primary) hypertension: Secondary | ICD-10-CM | POA: Diagnosis not present

## 2019-06-17 DIAGNOSIS — C3432 Malignant neoplasm of lower lobe, left bronchus or lung: Secondary | ICD-10-CM | POA: Diagnosis not present

## 2019-06-17 DIAGNOSIS — J449 Chronic obstructive pulmonary disease, unspecified: Secondary | ICD-10-CM

## 2019-06-17 DIAGNOSIS — C3492 Malignant neoplasm of unspecified part of left bronchus or lung: Secondary | ICD-10-CM

## 2019-06-17 DIAGNOSIS — Z79899 Other long term (current) drug therapy: Secondary | ICD-10-CM

## 2019-06-17 DIAGNOSIS — Z923 Personal history of irradiation: Secondary | ICD-10-CM

## 2019-06-17 DIAGNOSIS — Z95828 Presence of other vascular implants and grafts: Secondary | ICD-10-CM

## 2019-06-17 LAB — CBC WITH DIFFERENTIAL (CANCER CENTER ONLY)
Abs Immature Granulocytes: 0.02 10*3/uL (ref 0.00–0.07)
Basophils Absolute: 0.1 10*3/uL (ref 0.0–0.1)
Basophils Relative: 2 %
Eosinophils Absolute: 0.1 10*3/uL (ref 0.0–0.5)
Eosinophils Relative: 1 %
HCT: 31.1 % — ABNORMAL LOW (ref 39.0–52.0)
Hemoglobin: 10.6 g/dL — ABNORMAL LOW (ref 13.0–17.0)
Immature Granulocytes: 0 %
Lymphocytes Relative: 15 %
Lymphs Abs: 0.8 10*3/uL (ref 0.7–4.0)
MCH: 38.5 pg — ABNORMAL HIGH (ref 26.0–34.0)
MCHC: 34.1 g/dL (ref 30.0–36.0)
MCV: 113.1 fL — ABNORMAL HIGH (ref 80.0–100.0)
Monocytes Absolute: 0.5 10*3/uL (ref 0.1–1.0)
Monocytes Relative: 9 %
Neutro Abs: 3.9 10*3/uL (ref 1.7–7.7)
Neutrophils Relative %: 73 %
Platelet Count: 238 10*3/uL (ref 150–400)
RBC: 2.75 MIL/uL — ABNORMAL LOW (ref 4.22–5.81)
RDW: 17.2 % — ABNORMAL HIGH (ref 11.5–15.5)
WBC Count: 5.4 10*3/uL (ref 4.0–10.5)
nRBC: 0 % (ref 0.0–0.2)

## 2019-06-17 LAB — CMP (CANCER CENTER ONLY)
ALT: 13 U/L (ref 0–44)
AST: 15 U/L (ref 15–41)
Albumin: 3.9 g/dL (ref 3.5–5.0)
Alkaline Phosphatase: 104 U/L (ref 38–126)
Anion gap: 8 (ref 5–15)
BUN: 17 mg/dL (ref 8–23)
CO2: 22 mmol/L (ref 22–32)
Calcium: 8.8 mg/dL — ABNORMAL LOW (ref 8.9–10.3)
Chloride: 110 mmol/L (ref 98–111)
Creatinine: 1.04 mg/dL (ref 0.61–1.24)
GFR, Est AFR Am: >60 mL/min (ref >60)
GFR, Estimated: >60 mL/min (ref >60)
Glucose, Bld: 114 mg/dL — ABNORMAL HIGH (ref 70–99)
Potassium: 4.4 mmol/L (ref 3.5–5.1)
Sodium: 140 mmol/L (ref 135–145)
Total Bilirubin: 0.8 mg/dL (ref 0.3–1.2)
Total Protein: 6.4 g/dL — ABNORMAL LOW (ref 6.5–8.1)

## 2019-06-17 LAB — TSH: TSH: 2.201 u[IU]/mL (ref 0.320–4.118)

## 2019-06-17 MED ORDER — HEPARIN SOD (PORK) LOCK FLUSH 100 UNIT/ML IV SOLN
500.0000 [IU] | Freq: Once | INTRAVENOUS | Status: AC | PRN
  Administered 2019-06-17: 500 [IU]
  Filled 2019-06-17: qty 5

## 2019-06-17 MED ORDER — SODIUM CHLORIDE 0.9% FLUSH
10.0000 mL | INTRAVENOUS | Status: DC | PRN
  Administered 2019-06-17: 10 mL
  Filled 2019-06-17: qty 10

## 2019-06-17 MED ORDER — SODIUM CHLORIDE 0.9 % IV SOLN
9.6000 mg/kg | Freq: Once | INTRAVENOUS | Status: AC
  Administered 2019-06-17: 740 mg via INTRAVENOUS
  Filled 2019-06-17: qty 10

## 2019-06-17 MED ORDER — SODIUM CHLORIDE 0.9 % IV SOLN
Freq: Once | INTRAVENOUS | Status: AC
  Administered 2019-06-17: 11:00:00 via INTRAVENOUS
  Filled 2019-06-17: qty 250

## 2019-06-17 NOTE — Progress Notes (Signed)
[place] [phone]   [phone]  OFFICE PROGRESS NOTE  [name], MD [address] [place] [address]  DIAGNOSIS: Stage IIIB (T1b, N3, M0) non-small cell lung cancer favoring adenocarcinoma diagnosed in January 2020 and presented with left lower lobe pulmonary nodule in addition to bilateral hilar and subcarinal lymphadenopathy.  Molecular studies by guardant 360 showed no actionable mutations.  PRIOR THERAPY: Concurrent chemoradiation with weekly carboplatin for AUC of 2 and paclitaxel 45 mg/M2. First dose February 4th, 2020. Status post 5 cycles.   CURRENT THERAPY:  Consolidation immunotherapy with Imfinzi 10 mg/KG every 2 weeks, status post 7 cycles.  INTERVAL HISTORY: Robert Dixon 78 y.o. male returns to the clinic today for follow-up visit.  The patient is feeling fine today with no concerning complaints except for fatigue and few episodes of diarrhea.  He takes Imodium with some improvement of his diarrhea.  He denied having any current chest pain but has shortness of breath with exertion with no cough or hemoptysis.  He denied having any fever or chills.  He has no headache or visual changes.  The patient is here today for evaluation before starting cycle #8 of his consolidation treatment.  MEDICAL HISTORY: Past Medical History:  Diagnosis Date  . Cancer ([place])    skin-   . Complication of anesthesia   . COPD (chronic obstructive pulmonary disease) ([place])   . Dyspnea   . GERD (gastroesophageal reflux disease)   . Hemoptysis 11/20/2018  . History of hiatal hernia   . Hypertension   . Hypoxia 11/20/2018  . 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 Migraine with visual aura   . Non-small cell lung cancer ([place]) dx'd 11/20/18  . Pneumonia 11/20/2018  . PONV (postoperative nausea and vomiting)   . Pulmonary nodule 11/20/2018    ALLERGIES:  has No Known Allergies.  MEDICATIONS:  Current Outpatient  Medications  Medication Sig Dispense Refill  . albuterol (PROVENTIL HFA;VENTOLIN HFA) 108 (90 Base) MCG/ACT inhaler Inhale 2 puffs into the lungs every 6 (six) hours as needed for wheezing or shortness of breath. 1 Inhaler 6  . amLODipine (NORVASC) 10 MG tablet Take 5 mg by mouth daily.     . benazepril (LOTENSIN) 10 MG tablet Take 10 mg by mouth daily.    Marland Kitchen dimenhyDRINATE (DRAMAMINE) 50 MG tablet Take 50 mg by mouth every 8 (eight) hours as needed for dizziness.     Marland Kitchen HYDROcodone-homatropine (HYCODAN) 5-1.5 MG/5ML syrup Take 5 mLs by mouth every 6 (six) hours as needed for cough.     . lidocaine-prilocaine (EMLA) cream Apply 1 application topically as needed. 30 g 0  . pantoprazole (PROTONIX) 40 MG tablet Take 40 mg by mouth 2 (two) times daily.    . prochlorperazine (COMPAZINE) 10 MG tablet TAKE ONE TABLET BY MOUTH EVERY 6 HOURS AS NEEDED FOR NAUSEA OR VOMITING 30 tablet 0   No current facility-administered medications for this visit.    Facility-Administered Medications Ordered in Other Visits  Medication Dose Route Frequency Provider Last Rate Last Dose  . sodium chloride flush (NS) 0.9 % injection 10 mL  10 mL Intracatheter PRN [name], MD      . sodium chloride flush (NS) 0.9 % injection 10 mL  10 mL Intracatheter PRN [name], MD   10 mL at 05/20/19 1232    SURGICAL HISTORY:  Past Surgical History:  Procedure Laterality Date  . COLONOSCOPY W/ POLYPECTOMY    .  IR IMAGING GUIDED PORT INSERTION  03/14/2019  . TONSILLECTOMY    . VASECTOMY    . VIDEO BRONCHOSCOPY WITH ENDOBRONCHIAL ULTRASOUND N/A 12/06/2018   Procedure: VIDEO BRONCHOSCOPY WITH ENDOBRONCHIAL ULTRASOUND;  Surgeon: Garner Nash, DO;  Location: [facility] OR;  Service: Thoracic;  Laterality: N/A;    REVIEW OF SYSTEMS:  A comprehensive review of systems was negative except for: Constitutional: positive for fatigue Respiratory: positive for dyspnea on exertion Gastrointestinal: positive for diarrhea    PHYSICAL EXAMINATION: General appearance: alert, cooperative, fatigued and no distress Head: Normocephalic, without obvious abnormality, atraumatic Neck: no adenopathy, no JVD, supple, symmetrical, trachea midline and thyroid not enlarged, symmetric, no tenderness/mass/nodules Lymph nodes: Cervical, supraclavicular, and axillary nodes normal. Resp: clear to auscultation bilaterally Back: symmetric, no curvature. ROM normal. No CVA tenderness. Cardio: regular rate and rhythm, S1, S2 normal, no murmur, click, rub or gallop GI: soft, non-tender; bowel sounds normal; no masses,  no organomegaly Extremities: extremities normal, atraumatic, no cyanosis or edema  ECOG PERFORMANCE STATUS: 1 - Symptomatic but completely ambulatory  Blood pressure (!) 103/51, pulse 84, temperature 99.2 F (37.3 C), temperature source Oral, resp. rate 16, height 5' 8.5" (1.74 m), weight 163 lb 14.4 oz (74.3 kg), SpO2 91 %.  LABORATORY DATA: Lab Results  Component Value Date   WBC 5.4 06/17/2019   HGB 10.6 (L) 06/17/2019   HCT 31.1 (L) 06/17/2019   MCV 113.1 (H) 06/17/2019   PLT 238 06/17/2019      Chemistry      Component Value Date/Time   NA 143 06/03/2019 1139   K 4.3 06/03/2019 1139   CL 112 (H) 06/03/2019 1139   CO2 21 (L) 06/03/2019 1139   BUN 18 06/03/2019 1139   CREATININE 1.06 06/03/2019 1139      Component Value Date/Time   CALCIUM 8.2 (L) 06/03/2019 1139   ALKPHOS 104 06/03/2019 1139   AST 23 06/03/2019 1139   ALT 17 06/03/2019 1139   BILITOT 0.7 06/03/2019 1139       RADIOGRAPHIC STUDIES: Ct Chest W Contrast  Result Date: 05/23/2019 CLINICAL DATA:  Pneumonia x 2 months. History of lung cancer, status post chemotherapy and XRT EXAM: CT CHEST WITH CONTRAST TECHNIQUE: Multidetector CT imaging of the chest was performed during intravenous contrast administration. CONTRAST:  3mL OMNIPAQUE IOHEXOL 300 MG/ML  SOLN COMPARISON:  Chest radiographs dated 05/16/2019. CT chest dated 02/28/2019.  FINDINGS: Cardiovascular: The heart is normal in size. Trace pericardial fluid. No evidence of thoracic aortic aneurysm. Atherosclerotic calcifications aortic root/arch. Three vessel coronary atherosclerosis, LAD predominant. Right chest port terminates in the lower SVC. Mediastinum/Nodes: Small mediastinal lymph nodes which do not meet pathologic CT size criteria, including a 13 mm short axis subcarinal node, grossly unchanged. Visualized thyroid is unremarkable. Lungs/Pleura: Radiation changes in the lingula and left lower lobe, likely accounting for the radiographic abnormality. Additional radiation changes medially in the right lower lobe. Small left pleural effusion, new/progressive from prior CT. Moderate centrilobular and paraseptal emphysematous changes, upper lobe predominant. No pneumothorax. Upper Abdomen: Visualized upper abdomen is notable for hepatic cysts, left renal cysts, and vascular calcifications. Musculoskeletal: Mild degenerative changes of the lower thoracic/upper lumbar spine. IMPRESSION: Radiation changes, predominantly in the lingula and left lower lobe, accounting for the radiographic abnormality. No evidence of pneumonia. 13 mm short axis subcarinal node, within the upper limits of normal, grossly unchanged. Continued attention on follow-up is suggested. Small left pleural effusion. No findings specific for recurrent or metastatic disease. Aortic Atherosclerosis (ICD10-I70.0) and Emphysema (  ICD10-J43.9). Electronically Signed   By: Julian Hy M.D.   On: 05/23/2019 08:45    ASSESSMENT AND PLAN: This is a very pleasant 78 years old white male diagnosed with stage IIIb non-small cell lung cancer, adenocarcinoma with no actionable mutations  Completed the course of concurrent chemoradiation with weekly carboplatin and paclitaxel status post 5 cycles.  He has partial response to this treatment. The patient is currently undergoing consolidation treatment with immunotherapy with  Imfinzi status post 7 cycles.   The patient continues to tolerate this treatment well with no concerning complaints. I recommended for him to proceed with cycle #8 today as planned. He will come back for follow-up visit in 2 weeks for evaluation before the next cycle of his treatment. He was advised to call immediately if he has any concerning symptoms in the interval. The patient voices understanding of current disease status and treatment options and is in agreement with the current care plan.  All questions were answered. The patient knows to call the clinic with any problems, questions or concerns. We can certainly see the patient much sooner if necessary.  Disclaimer: This note was dictated with voice recognition software. Similar sounding words can inadvertently be transcribed and may not be corrected upon review.

## 2019-06-17 NOTE — Patient Instructions (Signed)
[facility] Discharge Instructions for Patients Receiving Chemotherapy  Today you received the following chemotherapy agents: Durvalumab (Imfinzi)  To help prevent nausea and vomiting after your treatment, we encourage you to take your nausea medication as directed.   If you develop nausea and vomiting that is not controlled by your nausea medication, call the clinic.   BELOW ARE SYMPTOMS THAT SHOULD BE REPORTED IMMEDIATELY:  *FEVER GREATER THAN 100.5 F  *CHILLS WITH OR WITHOUT FEVER  NAUSEA AND VOMITING THAT IS NOT CONTROLLED WITH YOUR NAUSEA MEDICATION  *UNUSUAL SHORTNESS OF BREATH  *UNUSUAL BRUISING OR BLEEDING  TENDERNESS IN MOUTH AND THROAT WITH OR WITHOUT PRESENCE OF ULCERS  *URINARY PROBLEMS  *BOWEL PROBLEMS  UNUSUAL RASH Items with * indicate a potential emergency and should be followed up as soon as possible.  Feel free to call the clinic should you have any questions or concerns. The clinic phone number is [phone].  Please show the [facility] at check-in to the Emergency Department and triage nurse.  Coronavirus (COVID-19) Are you at risk?  Are you at risk for the Coronavirus (COVID-19)?  To be considered HIGH RISK for Coronavirus (COVID-19), you have to meet the following criteria:  . Traveled to [place], [place], [place], [place] or [place]; or in the [place] to [place], [place], [place], or [place]; and have fever, cough, and shortness of breath within the last 2 weeks of travel OR . Been in close contact with a person diagnosed with COVID-19 within the last 2 weeks and have fever, cough, and shortness of breath . IF YOU DO NOT MEET THESE CRITERIA, YOU ARE CONSIDERED LOW RISK FOR COVID-19.  What to do if you are HIGH RISK for COVID-19?  Marland Kitchen If you are having a medical emergency, call 911. . Seek medical care right away. Before you go to a doctor's office, urgent care or emergency department, call ahead and tell them  about your recent travel, contact with someone diagnosed with COVID-19, and your symptoms. You should receive instructions from your physician's office regarding next steps of care.  . When you arrive at healthcare provider, tell the healthcare staff immediately you have returned from visiting [place], [place], [place], [place] or [place]; or traveled in the [place] to [place], [place], [place], or [place]; in the last two weeks or you have been in close contact with a person diagnosed with COVID-19 in the last 2 weeks.   . Tell the health care staff about your symptoms: fever, cough and shortness of breath. . After you have been seen by a medical provider, you will be either: o Tested for (COVID-19) and discharged home on quarantine except to seek medical care if symptoms worsen, and asked to  - Stay home and avoid contact with others until you get your results (4-5 days)  - Avoid travel on public transportation if possible (such as bus, train, or airplane) or o Sent to the Emergency Department by EMS for evaluation, COVID-19 testing, and possible admission depending on your condition and test results.  What to do if you are LOW RISK for COVID-19?  Reduce your risk of any infection by using the same precautions used for avoiding the common cold or flu:  Marland Kitchen Wash your hands often with soap and warm water for at least 20 seconds.  If soap and water are not readily available, use an alcohol-based hand sanitizer with at least 60% alcohol.  . If coughing or  sneezing, cover your mouth and nose by coughing or sneezing into the elbow areas of your shirt or coat, into a tissue or into your sleeve (not your hands). . Avoid shaking hands with others and consider head nods or verbal greetings only. . Avoid touching your eyes, nose, or mouth with unwashed hands.  . Avoid close contact with people who are sick. . Avoid places or events with large numbers of people in one location, like concerts or  sporting events. . Carefully consider travel plans you have or are making. . If you are planning any travel outside or inside the [place], visit the CDC's Travelers' Health webpage for the latest health notices. . If you have some symptoms but not all symptoms, continue to monitor at home and seek medical attention if your symptoms worsen. . If you are having a medical emergency, call 911.   [place] / e-Visit: [email or web address]         [facility]: [facility]: [phone]                   [facility]: [phone]

## 2019-06-18 ENCOUNTER — Encounter: Payer: Self-pay | Admitting: Pulmonary Disease

## 2019-06-18 ENCOUNTER — Ambulatory Visit (INDEPENDENT_AMBULATORY_CARE_PROVIDER_SITE_OTHER): Payer: Medicare HMO | Admitting: Pulmonary Disease

## 2019-06-18 VITALS — BP 110/62 | HR 97 | Temp 97.9°F | Ht 68.0 in | Wt 165.8 lb

## 2019-06-18 DIAGNOSIS — J701 Chronic and other pulmonary manifestations due to radiation: Secondary | ICD-10-CM | POA: Diagnosis not present

## 2019-06-18 DIAGNOSIS — J9611 Chronic respiratory failure with hypoxia: Secondary | ICD-10-CM

## 2019-06-18 DIAGNOSIS — R0602 Shortness of breath: Secondary | ICD-10-CM

## 2019-06-18 DIAGNOSIS — C3492 Malignant neoplasm of unspecified part of left bronchus or lung: Secondary | ICD-10-CM | POA: Diagnosis not present

## 2019-06-18 DIAGNOSIS — Z9221 Personal history of antineoplastic chemotherapy: Secondary | ICD-10-CM

## 2019-06-18 DIAGNOSIS — Z87891 Personal history of nicotine dependence: Secondary | ICD-10-CM | POA: Diagnosis not present

## 2019-06-18 DIAGNOSIS — Z923 Personal history of irradiation: Secondary | ICD-10-CM

## 2019-06-18 DIAGNOSIS — Z5112 Encounter for antineoplastic immunotherapy: Secondary | ICD-10-CM | POA: Diagnosis not present

## 2019-06-18 LAB — FUNGUS CULTURE W SMEAR
MICRO NUMBER:: 598335
SMEAR:: NONE SEEN
SPECIMEN QUALITY:: ADEQUATE

## 2019-06-18 LAB — RESPIRATORY CULTURE OR RESPIRATORY AND SPUTUM CULTURE
MICRO NUMBER:: 598336
RESULT:: NORMAL
SPECIMEN QUALITY:: ADEQUATE

## 2019-06-18 MED ORDER — TRELEGY ELLIPTA 100-62.5-25 MCG/INH IN AEPB: 1.0000 | INHALATION_SPRAY | Freq: Every day | RESPIRATORY_TRACT | 5 refills | Status: DC

## 2019-06-18 MED ORDER — TRELEGY ELLIPTA 100-62.5-25 MCG/INH IN AEPB: 1.0000 | INHALATION_SPRAY | Freq: Every day | RESPIRATORY_TRACT | 0 refills | Status: DC

## 2019-06-18 NOTE — Patient Instructions (Signed)
Thank you for visiting [name] at [facility]. Today we recommend the following: Orders Placed This Encounter  Procedures  . Pulmonary Function Test   Start trelegy inhaler, once daily.   Samples given today and demonstrated use.   Return in about 3 months (around 09/18/2019), or if symptoms worsen or fail to improve.    Please do your part to reduce the spread of COVID-19.

## 2019-06-18 NOTE — Progress Notes (Signed)
Patient seen in the office today and instructed on use of Trelegy.  Patient expressed understanding and demonstrated technique.  

## 2019-06-18 NOTE — Progress Notes (Signed)
Synopsis: Referred in January 2020 for abnormal CT, lung mass, mediastinal adenopathy by [name], MD  Subjective:   PATIENT ID: [place]: male DOB: [birth year], MRN: [id number]  Chief Complaint  Patient presents with  . Follow-up    PMH of former smoker. He had a cough for several weeks, month treated with abx. He started coughing up blood.  Patient was taken to [facility] where he was treated for pneumonia.  He was given a steroid pack and antibiotics.  He was treated with bronchodilators while he was there.  CT scan imaging revealed a left infrahilar mass with endobronchial disease within the upper lobe as well as enlarged mediastinal adenopathy.  Patient was referred to our pulmonary office for evaluation of potential underlying lung cancer.  OV 06/18/2019: Since we were last seen each other in the office patient has underwent bronchoscopy back in January for diagnosis of his lung cancer.  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  He has subsequently underwent concurrent chemoradiation with weekly carboplatinum and paclitaxel and his now on consolidation immunotherapy Imfinzi every 2 weeks.  He has been seen in the office over the past month with a diagnosis of pneumonia.  He was treated for this as an outpatient and has overall recovered.  He does feel like his functional status has slowly declined.  He feels much weaker than he did before.  But has been stable over the past few weeks.  He does feel better after treatment for pneumonia.  He had follow-up CAT scan imaging which reveals radiation changes to the left lung.  At this time he is still using his oxygen continuously.  CT scan revealed radiation fibrosis.  Past Medical History:  Diagnosis Date  . Cancer ([place])    skin-   . Complication of anesthesia   . COPD (chronic obstructive pulmonary disease) ([place])   . Dyspnea   .  GERD (gastroesophageal reflux disease)   . Hemoptysis 11/20/2018  . History of hiatal hernia   . Hypertension   . Hypoxia 11/20/2018  . 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 Migraine with visual aura   . Non-small cell lung cancer ([place]) dx'd 11/20/18  . Pneumonia 11/20/2018  . PONV (postoperative nausea and vomiting)   . Pulmonary nodule 11/20/2018     Family History  Problem Relation Age of Onset  . Cancer - Lung Mother   . Hypertension Father   . CVA Father   . Cancer - Cervical Sister      Past Surgical History:  Procedure Laterality Date  . COLONOSCOPY W/ POLYPECTOMY    . IR IMAGING GUIDED PORT INSERTION  03/14/2019  . TONSILLECTOMY    . VASECTOMY    . VIDEO BRONCHOSCOPY WITH ENDOBRONCHIAL ULTRASOUND N/A 12/06/2018   Procedure: VIDEO BRONCHOSCOPY WITH ENDOBRONCHIAL ULTRASOUND;  Surgeon: Garner Nash, DO;  Location: [facility] OR;  Service: Thoracic;  Laterality: N/A;    Social History   Socioeconomic History  . Marital status: Widowed    Spouse name: Not on file  . Number of children: Not on file  . Years of education: Not on file  . Highest education level: Not on file  Occupational History  . Not on file  Social Needs  . Financial resource strain: Not on file  . Food insecurity    Worry: Not on file    Inability: Not on file  . Transportation needs  Medical: No    Non-medical: No  Tobacco Use  . Smoking status: Former Smoker    Years: 30.00    Types: Cigarettes    Quit date: 2000    Years since quitting: 20.5  . Smokeless tobacco: Never Used  Substance and Sexual Activity  . Alcohol use: No  . Drug use: No  . Sexual activity: Not on file  Lifestyle  . Physical activity    Days per week: Not on file    Minutes per session: Not on file  . Stress: Not on file  Relationships  . Social Herbalist on phone: Not on file    Gets together: Not on file    Attends religious service: Not on file    Active member of club or organization: Not on file     Attends meetings of clubs or organizations: Not on file    Relationship status: Not on file  . Intimate partner violence    Fear of current or ex partner: Not on file    Emotionally abused: Not on file    Physically abused: Not on file    Forced sexual activity: Not on file  Other Topics Concern  . Not on file  Social History Narrative  . Not on file     No Known Allergies   Outpatient Medications Prior to Visit  Medication Sig Dispense Refill  . albuterol (PROVENTIL HFA;VENTOLIN HFA) 108 (90 Base) MCG/ACT inhaler Inhale 2 puffs into the lungs every 6 (six) hours as needed for wheezing or shortness of breath. 1 Inhaler 6  . amLODipine (NORVASC) 10 MG tablet Take 5 mg by mouth daily.     . benazepril (LOTENSIN) 10 MG tablet Take 10 mg by mouth daily.    Marland Kitchen dimenhyDRINATE (DRAMAMINE) 50 MG tablet Take 50 mg by mouth every 8 (eight) hours as needed for dizziness.     . lidocaine-prilocaine (EMLA) cream Apply 1 application topically as needed. 30 g 0  . pantoprazole (PROTONIX) 40 MG tablet Take 40 mg by mouth 2 (two) times daily.    . prochlorperazine (COMPAZINE) 10 MG tablet TAKE ONE TABLET BY MOUTH EVERY 6 HOURS AS NEEDED FOR NAUSEA OR VOMITING 30 tablet 0   Facility-Administered Medications Prior to Visit  Medication Dose Route Frequency Provider Last Rate Last Dose  . sodium chloride flush (NS) 0.9 % injection 10 mL  10 mL Intracatheter PRN [name], MD      . sodium chloride flush (NS) 0.9 % injection 10 mL  10 mL Intracatheter PRN [name], MD   10 mL at 05/20/19 1232    Review of Systems  Constitutional: Positive for malaise/fatigue and weight loss. Negative for chills and fever.  HENT: Negative for hearing loss, sore throat and tinnitus.   Eyes: Negative for blurred vision and double vision.  Respiratory: Positive for cough, sputum production and shortness of breath. Negative for hemoptysis, wheezing and stridor.   Cardiovascular: Negative for chest pain,  palpitations, orthopnea, leg swelling and PND.  Gastrointestinal: Negative for abdominal pain, constipation, diarrhea, heartburn, nausea and vomiting.  Genitourinary: Negative for dysuria, hematuria and urgency.  Musculoskeletal: Negative for joint pain and myalgias.  Skin: Negative for itching and rash.  Neurological: Negative for dizziness, tingling, weakness and headaches.  Endo/Heme/Allergies: Negative for environmental allergies. Does not bruise/bleed easily.  Psychiatric/Behavioral: Negative for depression. The patient is not nervous/anxious and does not have insomnia.   All other systems reviewed and are negative.  Objective:  Physical Exam Vitals signs reviewed.  Constitutional:      General: He is not in acute distress.    Appearance: He is well-developed.     Comments: Cachexia, muscle wasting  HENT:     Head: Normocephalic and atraumatic.     Mouth/Throat:     Pharynx: No oropharyngeal exudate.  Eyes:     Conjunctiva/sclera: Conjunctivae normal.     Pupils: Pupils are equal, round, and reactive to light.  Neck:     Vascular: No JVD.     Trachea: No tracheal deviation.     Comments: Loss of supraclavicular fat Cardiovascular:     Rate and Rhythm: Normal rate and regular rhythm.     Heart sounds: S1 normal and S2 normal.     Comments: Distant heart tones Pulmonary:     Effort: No tachypnea or accessory muscle usage.     Breath sounds: No stridor. Decreased breath sounds (throughout all lung fields) and wheezing present. No rhonchi or rales.  Abdominal:     General: Bowel sounds are normal. There is no distension.     Palpations: Abdomen is soft.     Tenderness: There is no abdominal tenderness.  Musculoskeletal:        General: Deformity (muscle wasting ) present.  Skin:    General: Skin is warm and dry.     Capillary Refill: Capillary refill takes less than 2 seconds.     Findings: No rash.  Neurological:     Mental Status: He is alert and oriented to  person, place, and time.  Psychiatric:        Behavior: Behavior normal.      Vitals:   06/18/19 1511  BP: 110/62  Pulse: 97  Temp: 97.9 F (36.6 C)  TempSrc: Oral  SpO2: 93%  Weight: 165 lb 12.8 oz (75.2 kg)  Height: 5\' 8"  (1.727 m)   93% on RA BMI Readings from Last 3 Encounters:  06/18/19 25.21 kg/m  06/17/19 24.56 kg/m  06/03/19 24.68 kg/m   Wt Readings from Last 3 Encounters:  06/18/19 165 lb 12.8 oz (75.2 kg)  06/17/19 163 lb 14.4 oz (74.3 kg)  06/03/19 164 lb 11.2 oz (74.7 kg)    CBC    Component Value Date/Time   WBC 5.4 06/17/2019 1017   WBC 6.5 03/14/2019 0929   RBC 2.75 (L) 06/17/2019 1017   HGB 10.6 (L) 06/17/2019 1017   HCT 31.1 (L) 06/17/2019 1017   PLT 238 06/17/2019 1017   MCV 113.1 (H) 06/17/2019 1017   MCH 38.5 (H) 06/17/2019 1017   MCHC 34.1 06/17/2019 1017   RDW 17.2 (H) 06/17/2019 1017   LYMPHSABS 0.8 06/17/2019 1017   MONOABS 0.5 06/17/2019 1017   EOSABS 0.1 06/17/2019 1017   BASOSABS 0.1 06/17/2019 1017    Chest Imaging: CT chest 11/20/2018: Left hilar mass with associated adenopathy endobronchial narrowing, subcarinal and bilateral hilar adenopathy. Ill-defined small density within the liver possible focus of metastatic disease.  CT chest July 2020: Fibrotic post radiation changes into the left chest, pleural thickening, intralobular septal thickening The patient's images have been independently reviewed by me.    Pulmonary Functions Testing Results: No flowsheet data found.  FeNO: None   Pathology: None   Echocardiogram: None   Heart Catheterization: None     Assessment & Plan:     ICD-10-CM   1. SOB (shortness of breath)  R06.02   2. Non-small cell carcinoma of left lung, stage 3 ([place])  C34.92   3. Chronic respiratory failure with hypoxia ([facility])  J96.11   4. Chronic hypoxemic respiratory failure ([facility])  J96.11   5. Former smoker  Z87.891   27. S/P radiation therapy > 12 wks ago  Z92.3   7. S/P chemotherapy, time  since greater than 12 weeks  Z92.21   8. Maintenance antineoplastic immunotherapy  Z51.12   9. 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  The patient was taken for bronchoscopy for successful diagnosis of a stage IIIb adenocarcinoma.  At this point he is undergoing maintenance immunotherapy and tolerating this well.  He did have a recent bout of pneumonia that was treated with antibiotics as an outpatient.  He has recovered from this.  Follow-up CT imaging reveals radiation and his fibrosis to the left chest.  He does have chronic hypoxemic respiratory failure requiring nasal cannula O2 supplementation.  This is stable at this time.  We also spent some time today in the office discussing advanced care planning.  He feels like he would like to continue his current management as long as he possibly can.  He has had a good response with chemo and he is feeling much stronger than he did before.  We today in the office we discussed options for increasing his endurance.  I encouraged him to stay active and start doing simple things around the home such as walking up and down the hallway and recording the amount of times he does this in an effort to give himself a scale on how active he is being.  Today in the office we also discussed topics such as DNR status as well as completion of a [place] MOST form.  He wants to think about this with his family and discuss it further.  We will also revisit this discussion at his next office visit.  I think it is important for him to continue maintenance with his immunotherapy he is having a good response to this.  No matter the case suffering a cardiac arrest with his current physical status as well as pulmonary status the survival of that is unlikely therefore  considering a DNR status while undergoing treatments for his disease would be appropriate.  18 minutes was spent discussing advanced care planning today in the office.  As for her shortness of breath this is likely related to underlying diagnosis of COPD and radiation-induced fibrosis.  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  He has not had pulmonary function test recently.  We will have full pulmonary function test completed on his next office visit.  We will start him on Trelegy and give him samples of this today in the office to see if it makes any difference in his symptoms.  Patient to return her office in 3 months.  Greater than 50% of this patient's 25-minute office visit was spent discussing the above recommendations and treatment plan.   Current Outpatient Medications:  .  albuterol (PROVENTIL HFA;VENTOLIN HFA) 108 (90 Base) MCG/ACT inhaler, Inhale 2 puffs into the lungs every 6 (six) hours as needed for wheezing or shortness of breath., Disp: 1 Inhaler, Rfl: 6 .  amLODipine (NORVASC) 10 MG tablet, Take 5 mg by mouth daily. , Disp: , Rfl:  .  benazepril (LOTENSIN) 10 MG tablet, Take  10 mg by mouth daily., Disp: , Rfl:  .  dimenhyDRINATE (DRAMAMINE) 50 MG tablet, Take 50 mg by mouth every 8 (eight) hours as needed for dizziness. , Disp: , Rfl:  .  lidocaine-prilocaine (EMLA) cream, Apply 1 application topically as needed., Disp: 30 g, Rfl: 0 .  pantoprazole (PROTONIX) 40 MG tablet, Take 40 mg by mouth 2 (two) times daily., Disp: , Rfl:  .  prochlorperazine (COMPAZINE) 10 MG tablet, TAKE ONE TABLET BY MOUTH EVERY 6 HOURS AS NEEDED FOR NAUSEA OR VOMITING, Disp: 30 tablet, Rfl: 0 No current facility-administered medications for this visit.   Facility-Administered Medications Ordered in Other Visits:  .  sodium chloride flush (NS) 0.9 % injection 10 mL, 10 mL, Intracatheter, PRN, [name], MD .  sodium chloride flush (NS) 0.9 %  injection 10 mL, 10 mL, Intracatheter, PRN, [name], MD, 10 mL at 05/20/19 1232   Garner Nash, DO [facility] 06/18/2019 3:37 PM

## 2019-06-29 DIAGNOSIS — J439 Emphysema, unspecified: Secondary | ICD-10-CM | POA: Diagnosis not present

## 2019-07-01 ENCOUNTER — Inpatient Hospital Stay (HOSPITAL_BASED_OUTPATIENT_CLINIC_OR_DEPARTMENT_OTHER): Payer: Medicare HMO | Admitting: Internal Medicine

## 2019-07-01 ENCOUNTER — Inpatient Hospital Stay: Payer: Medicare HMO

## 2019-07-01 ENCOUNTER — Inpatient Hospital Stay: Payer: Medicare HMO | Attending: Internal Medicine

## 2019-07-01 ENCOUNTER — Encounter: Payer: Self-pay | Admitting: Internal Medicine

## 2019-07-01 ENCOUNTER — Other Ambulatory Visit: Payer: Self-pay

## 2019-07-01 VITALS — BP 98/63 | HR 90 | Temp 98.2°F | Resp 18 | Ht 68.0 in | Wt 168.2 lb

## 2019-07-01 DIAGNOSIS — J449 Chronic obstructive pulmonary disease, unspecified: Secondary | ICD-10-CM | POA: Insufficient documentation

## 2019-07-01 DIAGNOSIS — R5383 Other fatigue: Secondary | ICD-10-CM | POA: Diagnosis not present

## 2019-07-01 DIAGNOSIS — Z9981 Dependence on supplemental oxygen: Secondary | ICD-10-CM | POA: Insufficient documentation

## 2019-07-01 DIAGNOSIS — C3490 Malignant neoplasm of unspecified part of unspecified bronchus or lung: Secondary | ICD-10-CM

## 2019-07-01 DIAGNOSIS — Z9221 Personal history of antineoplastic chemotherapy: Secondary | ICD-10-CM | POA: Insufficient documentation

## 2019-07-01 DIAGNOSIS — Z923 Personal history of irradiation: Secondary | ICD-10-CM | POA: Insufficient documentation

## 2019-07-01 DIAGNOSIS — Z5112 Encounter for antineoplastic immunotherapy: Secondary | ICD-10-CM

## 2019-07-01 DIAGNOSIS — C3492 Malignant neoplasm of unspecified part of left bronchus or lung: Secondary | ICD-10-CM

## 2019-07-01 DIAGNOSIS — I10 Essential (primary) hypertension: Secondary | ICD-10-CM | POA: Insufficient documentation

## 2019-07-01 DIAGNOSIS — Z95828 Presence of other vascular implants and grafts: Secondary | ICD-10-CM

## 2019-07-01 DIAGNOSIS — Z79899 Other long term (current) drug therapy: Secondary | ICD-10-CM | POA: Diagnosis not present

## 2019-07-01 DIAGNOSIS — K219 Gastro-esophageal reflux disease without esophagitis: Secondary | ICD-10-CM | POA: Insufficient documentation

## 2019-07-01 DIAGNOSIS — C3432 Malignant neoplasm of lower lobe, left bronchus or lung: Secondary | ICD-10-CM | POA: Insufficient documentation

## 2019-07-01 LAB — CBC WITH DIFFERENTIAL (CANCER CENTER ONLY)
Abs Immature Granulocytes: 0.02 10*3/uL (ref 0.00–0.07)
Basophils Absolute: 0.1 10*3/uL (ref 0.0–0.1)
Basophils Relative: 2 %
Eosinophils Absolute: 0.1 10*3/uL (ref 0.0–0.5)
Eosinophils Relative: 2 %
HCT: 28.3 % — ABNORMAL LOW (ref 39.0–52.0)
Hemoglobin: 9.5 g/dL — ABNORMAL LOW (ref 13.0–17.0)
Immature Granulocytes: 0 %
Lymphocytes Relative: 14 %
Lymphs Abs: 0.7 10*3/uL (ref 0.7–4.0)
MCH: 40.4 pg — ABNORMAL HIGH (ref 26.0–34.0)
MCHC: 33.6 g/dL (ref 30.0–36.0)
MCV: 120.4 fL — ABNORMAL HIGH (ref 80.0–100.0)
Monocytes Absolute: 0.5 10*3/uL (ref 0.1–1.0)
Monocytes Relative: 11 %
Neutro Abs: 3.6 10*3/uL (ref 1.7–7.7)
Neutrophils Relative %: 71 %
Platelet Count: 226 10*3/uL (ref 150–400)
RBC: 2.35 MIL/uL — ABNORMAL LOW (ref 4.22–5.81)
RDW: 17.3 % — ABNORMAL HIGH (ref 11.5–15.5)
WBC Count: 5 10*3/uL (ref 4.0–10.5)
nRBC: 0 % (ref 0.0–0.2)

## 2019-07-01 LAB — CMP (CANCER CENTER ONLY)
ALT: 13 U/L (ref 0–44)
AST: 15 U/L (ref 15–41)
Albumin: 3.7 g/dL (ref 3.5–5.0)
Alkaline Phosphatase: 105 U/L (ref 38–126)
Anion gap: 8 (ref 5–15)
BUN: 15 mg/dL (ref 8–23)
CO2: 22 mmol/L (ref 22–32)
Calcium: 8.7 mg/dL — ABNORMAL LOW (ref 8.9–10.3)
Chloride: 111 mmol/L (ref 98–111)
Creatinine: 1.04 mg/dL (ref 0.61–1.24)
GFR, Est AFR Am: >60 mL/min (ref >60)
GFR, Estimated: >60 mL/min (ref >60)
Glucose, Bld: 103 mg/dL — ABNORMAL HIGH (ref 70–99)
Potassium: 3.8 mmol/L (ref 3.5–5.1)
Sodium: 141 mmol/L (ref 135–145)
Total Bilirubin: 0.9 mg/dL (ref 0.3–1.2)
Total Protein: 6.1 g/dL — ABNORMAL LOW (ref 6.5–8.1)

## 2019-07-01 MED ORDER — SODIUM CHLORIDE 0.9% FLUSH
10.0000 mL | INTRAVENOUS | Status: DC | PRN
  Administered 2019-07-01: 10 mL
  Filled 2019-07-01: qty 10

## 2019-07-01 MED ORDER — SODIUM CHLORIDE 0.9 % IV SOLN
Freq: Once | INTRAVENOUS | Status: AC
  Administered 2019-07-01: 11:00:00 via INTRAVENOUS
  Filled 2019-07-01: qty 250

## 2019-07-01 MED ORDER — SODIUM CHLORIDE 0.9 % IV SOLN
9.4000 mg/kg | Freq: Once | INTRAVENOUS | Status: AC
  Administered 2019-07-01: 740 mg via INTRAVENOUS
  Filled 2019-07-01: qty 4.8

## 2019-07-01 MED ORDER — SODIUM CHLORIDE 0.9% FLUSH
10.0000 mL | INTRAVENOUS | Status: DC | PRN
  Administered 2019-07-01: 10:00:00 10 mL
  Filled 2019-07-01: qty 10

## 2019-07-01 MED ORDER — HEPARIN SOD (PORK) LOCK FLUSH 100 UNIT/ML IV SOLN
500.0000 [IU] | Freq: Once | INTRAVENOUS | Status: AC | PRN
  Administered 2019-07-01: 13:00:00 500 [IU]
  Filled 2019-07-01: qty 5

## 2019-07-01 NOTE — Progress Notes (Signed)
[place] Telephone:(336) [phone]   Fax:(336) [phone]  OFFICE PROGRESS NOTE  [name], MD [address] [place] [address]  DIAGNOSIS: Stage IIIB (T1b, N3, M0) non-small cell lung cancer favoring adenocarcinoma diagnosed in January 2020 and presented with left lower lobe pulmonary nodule in addition to bilateral hilar and subcarinal lymphadenopathy.  Molecular studies by guardant 360 showed no actionable mutations.  PRIOR THERAPY: Concurrent chemoradiation with weekly carboplatin for AUC of 2 and paclitaxel 45 mg/M2. First dose February 4th, 2020. Status post 5 cycles.   CURRENT THERAPY:  Consolidation immunotherapy with Imfinzi 10 mg/KG every 2 weeks, status post 7 cycles.  INTERVAL HISTORY: Robert Dixon 78 y.o. male returns to the clinic today for follow-up visit.  The patient is feeling fine today with no concerning complaints except for the baseline shortness of breath and he is currently on home oxygen.  He continues to tolerate his treatment with Imfinzi fairly well.  He denied having any current chest pain, cough or hemoptysis.  He denied having any fever or chills.  He has no nausea, vomiting, diarrhea or constipation.  He has no headache or visual changes.  He is here today for evaluation before starting cycle #8 of his treatment.   MEDICAL HISTORY: Past Medical History:  Diagnosis Date  . Cancer ([place])    skin-   . Complication of anesthesia   . COPD (chronic obstructive pulmonary disease) ([place])   . Dyspnea   . GERD (gastroesophageal reflux disease)   . Hemoptysis 11/20/2018  . History of hiatal hernia   . Hypertension   . Hypoxia 11/20/2018  . 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 Migraine with visual aura   . Non-small cell lung cancer ([place]) dx'd 11/20/18  . Pneumonia 11/20/2018  . PONV (postoperative nausea and vomiting)   . Pulmonary nodule 11/20/2018    ALLERGIES:  has No Known Allergies.  MEDICATIONS:   Current Outpatient Medications  Medication Sig Dispense Refill  . albuterol (PROVENTIL HFA;VENTOLIN HFA) 108 (90 Base) MCG/ACT inhaler Inhale 2 puffs into the lungs every 6 (six) hours as needed for wheezing or shortness of breath. 1 Inhaler 6  . amLODipine (NORVASC) 10 MG tablet Take 5 mg by mouth daily.     . benazepril (LOTENSIN) 10 MG tablet Take 10 mg by mouth daily.    Marland Kitchen dimenhyDRINATE (DRAMAMINE) 50 MG tablet Take 50 mg by mouth every 8 (eight) hours as needed for dizziness.     . Fluticasone-Umeclidin-Vilant (TRELEGY ELLIPTA) 100-62.5-25 MCG/INH AEPB Inhale 1 puff into the lungs daily. 60 each 5  . lidocaine-prilocaine (EMLA) cream Apply 1 application topically as needed. 30 g 0  . pantoprazole (PROTONIX) 40 MG tablet Take 40 mg by mouth 2 (two) times daily.    . prochlorperazine (COMPAZINE) 10 MG tablet TAKE ONE TABLET BY MOUTH EVERY 6 HOURS AS NEEDED FOR NAUSEA OR VOMITING 30 tablet 0   No current facility-administered medications for this visit.    Facility-Administered Medications Ordered in Other Visits  Medication Dose Route Frequency Provider Last Rate Last Dose  . sodium chloride flush (NS) 0.9 % injection 10 mL  10 mL Intracatheter PRN [name], MD      . sodium chloride flush (NS) 0.9 % injection 10 mL  10 mL Intracatheter PRN [name], MD   10 mL at 05/20/19 1232    SURGICAL HISTORY:  Past Surgical History:  Procedure Laterality Date  . COLONOSCOPY W/ POLYPECTOMY    .  IR IMAGING GUIDED PORT INSERTION  03/14/2019  . TONSILLECTOMY    . VASECTOMY    . VIDEO BRONCHOSCOPY WITH ENDOBRONCHIAL ULTRASOUND N/A 12/06/2018   Procedure: VIDEO BRONCHOSCOPY WITH ENDOBRONCHIAL ULTRASOUND;  Surgeon: Garner Nash, DO;  Location: [facility] OR;  Service: Thoracic;  Laterality: N/A;    REVIEW OF SYSTEMS:  A comprehensive review of systems was negative except for: Constitutional: positive for fatigue Respiratory: positive for dyspnea on exertion   PHYSICAL EXAMINATION:  General appearance: alert, cooperative, fatigued and no distress Head: Normocephalic, without obvious abnormality, atraumatic Neck: no adenopathy, no JVD, supple, symmetrical, trachea midline and thyroid not enlarged, symmetric, no tenderness/mass/nodules Lymph nodes: Cervical, supraclavicular, and axillary nodes normal. Resp: clear to auscultation bilaterally Back: symmetric, no curvature. ROM normal. No CVA tenderness. Cardio: regular rate and rhythm, S1, S2 normal, no murmur, click, rub or gallop GI: soft, non-tender; bowel sounds normal; no masses,  no organomegaly Extremities: extremities normal, atraumatic, no cyanosis or edema  ECOG PERFORMANCE STATUS: 1 - Symptomatic but completely ambulatory  Blood pressure 98/63, pulse 90, temperature 98.2 F (36.8 C), temperature source Oral, resp. rate 18, height 5\' 8"  (1.727 m), weight 168 lb 3.2 oz (76.3 kg), SpO2 94 %.  LABORATORY DATA: Lab Results  Component Value Date   WBC 5.0 07/01/2019   HGB 9.5 (L) 07/01/2019   HCT 28.3 (L) 07/01/2019   MCV 120.4 (H) 07/01/2019   PLT 226 07/01/2019      Chemistry      Component Value Date/Time   NA 140 06/17/2019 1017   K 4.4 06/17/2019 1017   CL 110 06/17/2019 1017   CO2 22 06/17/2019 1017   BUN 17 06/17/2019 1017   CREATININE 1.04 06/17/2019 1017      Component Value Date/Time   CALCIUM 8.8 (L) 06/17/2019 1017   ALKPHOS 104 06/17/2019 1017   AST 15 06/17/2019 1017   ALT 13 06/17/2019 1017   BILITOT 0.8 06/17/2019 1017       RADIOGRAPHIC STUDIES: No results found.  ASSESSMENT AND PLAN: This is a very pleasant 78 years old white male diagnosed with stage IIIb non-small cell lung cancer, adenocarcinoma with no actionable mutations  Completed the course of concurrent chemoradiation with weekly carboplatin and paclitaxel status post 5 cycles.  He has partial response to this treatment. The patient is currently undergoing consolidation treatment with immunotherapy with Imfinzi status  post 7 cycles.   The patient continues to tolerate this treatment well with no concerning complaints. I recommended for him to proceed with cycle #8 today as planned. He will come back for follow-up visit in 2 weeks for evaluation before the next cycle of his treatment. The patient was advised to call immediately if he has any concerning symptoms in the interval. The patient voices understanding of current disease status and treatment options and is in agreement with the current care plan.  All questions were answered. The patient knows to call the clinic with any problems, questions or concerns. We can certainly see the patient much sooner if necessary.  Disclaimer: This note was dictated with voice recognition software. Similar sounding words can inadvertently be transcribed and may not be corrected upon review.

## 2019-07-01 NOTE — Patient Instructions (Signed)
[facility] Discharge Instructions for Patients Receiving Chemotherapy  Today you received the following chemotherapy agents Durvalumab (IMFINZI).  To help prevent nausea and vomiting after your treatment, we encourage you to take your nausea medication as prescribed.   If you develop nausea and vomiting that is not controlled by your nausea medication, call the clinic.   BELOW ARE SYMPTOMS THAT SHOULD BE REPORTED IMMEDIATELY:  *FEVER GREATER THAN 100.5 F  *CHILLS WITH OR WITHOUT FEVER  NAUSEA AND VOMITING THAT IS NOT CONTROLLED WITH YOUR NAUSEA MEDICATION  *UNUSUAL SHORTNESS OF BREATH  *UNUSUAL BRUISING OR BLEEDING  TENDERNESS IN MOUTH AND THROAT WITH OR WITHOUT PRESENCE OF ULCERS  *URINARY PROBLEMS  *BOWEL PROBLEMS  UNUSUAL RASH Items with * indicate a potential emergency and should be followed up as soon as possible.  Feel free to call the clinic should you have any questions or concerns. The clinic phone number is [phone].  Please show the [place] at check-in to the Emergency Department and triage nurse.  Coronavirus (COVID-19) Are you at risk?  Are you at risk for the Coronavirus (COVID-19)?  To be considered HIGH RISK for Coronavirus (COVID-19), you have to meet the following criteria:  . Traveled to [place], [place], [place], [place] or [place]; or in the [place] to [place], [place], [place], or [place]; and have fever, cough, and shortness of breath within the last 2 weeks of travel OR . Been in close contact with a person diagnosed with COVID-19 within the last 2 weeks and have fever, cough, and shortness of breath . IF YOU DO NOT MEET THESE CRITERIA, YOU ARE CONSIDERED LOW RISK FOR COVID-19.  What to do if you are HIGH RISK for COVID-19?  Marland Kitchen If you are having a medical emergency, call 911. . Seek medical care right away. Before you go to a doctor's office, urgent care or emergency department, call ahead and tell them  about your recent travel, contact with someone diagnosed with COVID-19, and your symptoms. You should receive instructions from your physician's office regarding next steps of care.  . When you arrive at healthcare provider, tell the healthcare staff immediately you have returned from visiting [place], [place], [place], [place] or [place]; or traveled in the [place] to [place], [place], [place], or [place]; in the last two weeks or you have been in close contact with a person diagnosed with COVID-19 in the last 2 weeks.   . Tell the health care staff about your symptoms: fever, cough and shortness of breath. . After you have been seen by a medical provider, you will be either: o Tested for (COVID-19) and discharged home on quarantine except to seek medical care if symptoms worsen, and asked to  - Stay home and avoid contact with others until you get your results (4-5 days)  - Avoid travel on public transportation if possible (such as bus, train, or airplane) or o Sent to the Emergency Department by EMS for evaluation, COVID-19 testing, and possible admission depending on your condition and test results.  What to do if you are LOW RISK for COVID-19?  Reduce your risk of any infection by using the same precautions used for avoiding the common cold or flu:  Marland Kitchen Wash your hands often with soap and warm water for at least 20 seconds.  If soap and water are not readily available, use an alcohol-based hand sanitizer with at least 60% alcohol.  . If coughing or  sneezing, cover your mouth and nose by coughing or sneezing into the elbow areas of your shirt or coat, into a tissue or into your sleeve (not your hands). . Avoid shaking hands with others and consider head nods or verbal greetings only. . Avoid touching your eyes, nose, or mouth with unwashed hands.  . Avoid close contact with people who are sick. . Avoid places or events with large numbers of people in one location, like concerts or  sporting events. . Carefully consider travel plans you have or are making. . If you are planning any travel outside or inside the [place], visit the CDC's Travelers' Health webpage for the latest health notices. . If you have some symptoms but not all symptoms, continue to monitor at home and seek medical attention if your symptoms worsen. . If you are having a medical emergency, call 911.   [place] / e-Visit: [email or web address]         [facility]: [facility]: [phone]                   [facility]: [phone]

## 2019-07-04 LAB — AFB CULTURE WITH SMEAR (NOT AT ARMC)
Acid Fast Culture: NEGATIVE
Acid Fast Smear: NEGATIVE

## 2019-07-15 ENCOUNTER — Inpatient Hospital Stay: Payer: Medicare HMO

## 2019-07-15 ENCOUNTER — Telehealth: Payer: Self-pay | Admitting: Internal Medicine

## 2019-07-15 ENCOUNTER — Other Ambulatory Visit: Payer: Self-pay

## 2019-07-15 ENCOUNTER — Encounter: Payer: Self-pay | Admitting: Physician Assistant

## 2019-07-15 ENCOUNTER — Inpatient Hospital Stay (HOSPITAL_BASED_OUTPATIENT_CLINIC_OR_DEPARTMENT_OTHER): Payer: Medicare HMO | Admitting: Physician Assistant

## 2019-07-15 VITALS — BP 114/49 | HR 89 | Temp 98.7°F | Resp 18 | Ht 68.0 in | Wt 166.6 lb

## 2019-07-15 DIAGNOSIS — Z5112 Encounter for antineoplastic immunotherapy: Secondary | ICD-10-CM

## 2019-07-15 DIAGNOSIS — I700 Atherosclerosis of aorta: Secondary | ICD-10-CM | POA: Diagnosis not present

## 2019-07-15 DIAGNOSIS — Z0000 Encounter for general adult medical examination without abnormal findings: Secondary | ICD-10-CM | POA: Diagnosis not present

## 2019-07-15 DIAGNOSIS — Z9981 Dependence on supplemental oxygen: Secondary | ICD-10-CM | POA: Diagnosis not present

## 2019-07-15 DIAGNOSIS — J449 Chronic obstructive pulmonary disease, unspecified: Secondary | ICD-10-CM | POA: Diagnosis not present

## 2019-07-15 DIAGNOSIS — K219 Gastro-esophageal reflux disease without esophagitis: Secondary | ICD-10-CM | POA: Diagnosis not present

## 2019-07-15 DIAGNOSIS — C3492 Malignant neoplasm of unspecified part of left bronchus or lung: Secondary | ICD-10-CM

## 2019-07-15 DIAGNOSIS — Z95828 Presence of other vascular implants and grafts: Secondary | ICD-10-CM

## 2019-07-15 DIAGNOSIS — C3490 Malignant neoplasm of unspecified part of unspecified bronchus or lung: Secondary | ICD-10-CM | POA: Diagnosis not present

## 2019-07-15 DIAGNOSIS — Z923 Personal history of irradiation: Secondary | ICD-10-CM | POA: Diagnosis not present

## 2019-07-15 DIAGNOSIS — I10 Essential (primary) hypertension: Secondary | ICD-10-CM | POA: Diagnosis not present

## 2019-07-15 DIAGNOSIS — Z1389 Encounter for screening for other disorder: Secondary | ICD-10-CM | POA: Diagnosis not present

## 2019-07-15 DIAGNOSIS — R5383 Other fatigue: Secondary | ICD-10-CM | POA: Diagnosis not present

## 2019-07-15 DIAGNOSIS — C3432 Malignant neoplasm of lower lobe, left bronchus or lung: Secondary | ICD-10-CM | POA: Diagnosis not present

## 2019-07-15 DIAGNOSIS — Z9221 Personal history of antineoplastic chemotherapy: Secondary | ICD-10-CM | POA: Diagnosis not present

## 2019-07-15 LAB — CBC WITH DIFFERENTIAL (CANCER CENTER ONLY)
Abs Immature Granulocytes: 0.02 10*3/uL (ref 0.00–0.07)
Basophils Absolute: 0.1 10*3/uL (ref 0.0–0.1)
Basophils Relative: 2 %
Eosinophils Absolute: 0.1 10*3/uL (ref 0.0–0.5)
Eosinophils Relative: 1 %
HCT: 27.5 % — ABNORMAL LOW (ref 39.0–52.0)
Hemoglobin: 9.3 g/dL — ABNORMAL LOW (ref 13.0–17.0)
Immature Granulocytes: 0 %
Lymphocytes Relative: 13 %
Lymphs Abs: 0.7 10*3/uL (ref 0.7–4.0)
MCH: 41.2 pg — ABNORMAL HIGH (ref 26.0–34.0)
MCHC: 33.8 g/dL (ref 30.0–36.0)
MCV: 121.7 fL — ABNORMAL HIGH (ref 80.0–100.0)
Monocytes Absolute: 0.6 10*3/uL (ref 0.1–1.0)
Monocytes Relative: 11 %
Neutro Abs: 3.8 10*3/uL (ref 1.7–7.7)
Neutrophils Relative %: 73 %
Platelet Count: 224 10*3/uL (ref 150–400)
RBC: 2.26 MIL/uL — ABNORMAL LOW (ref 4.22–5.81)
RDW: 16.6 % — ABNORMAL HIGH (ref 11.5–15.5)
WBC Count: 5.2 10*3/uL (ref 4.0–10.5)
nRBC: 0 % (ref 0.0–0.2)

## 2019-07-15 LAB — CMP (CANCER CENTER ONLY)
ALT: 13 U/L (ref 0–44)
AST: 15 U/L (ref 15–41)
Albumin: 3.7 g/dL (ref 3.5–5.0)
Alkaline Phosphatase: 96 U/L (ref 38–126)
Anion gap: 9 (ref 5–15)
BUN: 14 mg/dL (ref 8–23)
CO2: 21 mmol/L — ABNORMAL LOW (ref 22–32)
Calcium: 8.6 mg/dL — ABNORMAL LOW (ref 8.9–10.3)
Chloride: 111 mmol/L (ref 98–111)
Creatinine: 1 mg/dL (ref 0.61–1.24)
GFR, Est AFR Am: >60 mL/min (ref >60)
GFR, Estimated: >60 mL/min (ref >60)
Glucose, Bld: 133 mg/dL — ABNORMAL HIGH (ref 70–99)
Potassium: 4.2 mmol/L (ref 3.5–5.1)
Sodium: 141 mmol/L (ref 135–145)
Total Bilirubin: 1 mg/dL (ref 0.3–1.2)
Total Protein: 6 g/dL — ABNORMAL LOW (ref 6.5–8.1)

## 2019-07-15 LAB — TSH: TSH: 1.787 u[IU]/mL (ref 0.320–4.118)

## 2019-07-15 MED ORDER — SODIUM CHLORIDE 0.9% FLUSH
10.0000 mL | INTRAVENOUS | Status: DC | PRN
  Administered 2019-07-15: 10 mL
  Filled 2019-07-15: qty 10

## 2019-07-15 MED ORDER — HEPARIN SOD (PORK) LOCK FLUSH 100 UNIT/ML IV SOLN
500.0000 [IU] | Freq: Once | INTRAVENOUS | Status: AC | PRN
  Administered 2019-07-15: 500 [IU]
  Filled 2019-07-15: qty 5

## 2019-07-15 MED ORDER — SODIUM CHLORIDE 0.9 % IV SOLN
Freq: Once | INTRAVENOUS | Status: AC
  Administered 2019-07-15: 12:00:00 via INTRAVENOUS
  Filled 2019-07-15: qty 250

## 2019-07-15 MED ORDER — SODIUM CHLORIDE 0.9 % IV SOLN
9.4000 mg/kg | Freq: Once | INTRAVENOUS | Status: AC
  Administered 2019-07-15: 740 mg via INTRAVENOUS
  Filled 2019-07-15: qty 4.8

## 2019-07-15 NOTE — Patient Instructions (Signed)
Cancer Center Discharge Instructions for Patients Receiving Chemotherapy  Today you received the following chemotherapy agents: Imfinzi.  To help prevent nausea and vomiting after your treatment, we encourage you to take your nausea medication as directed.   If you develop nausea and vomiting that is not controlled by your nausea medication, call the clinic.   BELOW ARE SYMPTOMS THAT SHOULD BE REPORTED IMMEDIATELY:  *FEVER GREATER THAN 100.5 F  *CHILLS WITH OR WITHOUT FEVER  NAUSEA AND VOMITING THAT IS NOT CONTROLLED WITH YOUR NAUSEA MEDICATION  *UNUSUAL SHORTNESS OF BREATH  *UNUSUAL BRUISING OR BLEEDING  TENDERNESS IN MOUTH AND THROAT WITH OR WITHOUT PRESENCE OF ULCERS  *URINARY PROBLEMS  *BOWEL PROBLEMS  UNUSUAL RASH Items with * indicate a potential emergency and should be followed up as soon as possible.  Feel free to call the clinic should you have any questions or concerns. The clinic phone number is [phone].  Please show the CHEMO ALERT CARD at check-in to the Emergency Department and triage nurse.   

## 2019-07-15 NOTE — Progress Notes (Signed)
[facility] OFFICE PROGRESS NOTE  [name], MD [place] [address] [place] [address]  DIAGNOSIS: Stage IIIB(T1b, N3, M0) non-small cell lung cancer favoring adenocarcinoma diagnosed in January 2020 and presented with left lower lobe pulmonary nodule in addition to bilateral hilar and subcarinal lymphadenopathy  PRIOR THERAPY: Concurrent chemoradiation with weekly carboplatin for an AUC of 2 and paclitaxel 45 mg/m2. Status post 5 cycles.  CURRENT THERAPY: Consolidation immunotherapy with Imfinzi 10 mg/kg IV every 2 weeks. First dose March 25, 2019.Status post8cycles.  INTERVAL HISTORY: Robert Dixon 78 y.o. male returns to the clinic for a follow up visit. The patient is feeling fairly well today without any concerning complaints except for a decreased desire to eat. He denies any nausea, vomiting, diarrhea, constipation, abdominal pain, or heartburn. He has not lost weight since his last appointment. He occasionally drinks ensure. Otherwise, the patient continues to tolerate treatment with imfinzi well without any adverse effects. Denies any fever, chills, or night sweats. He reports his baseline shortness of breath and cough secondary to COPD. He is on 2 L of home oxygen via nasal cannula. He is followed closely by pulmonology and had a visit with them last month. Denies any chest pain, cough, or hemoptysis. Denies any headache but reports progressive visual blurring in his right eye. The patient is due to see his eye doctor but has been evaluated due to concerns of safety with COVID. Denies any rashes or skin changes. The patient is here today for evaluation prior to starting cycle #9  MEDICAL HISTORY: Past Medical History:  Diagnosis Date  . Cancer ([place])    skin-   . Complication of anesthesia   . COPD (chronic obstructive pulmonary disease) ([place])   . Dyspnea   . GERD (gastroesophageal reflux disease)   . Hemoptysis 11/20/2018  . History of hiatal  hernia   . Hypertension   . Hypoxia 11/20/2018  . 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 Migraine with visual aura   . Non-small cell lung cancer ([place]) dx'd 11/20/18  . Pneumonia 11/20/2018  . PONV (postoperative nausea and vomiting)   . Pulmonary nodule 11/20/2018    ALLERGIES:  has No Known Allergies.  MEDICATIONS:  Current Outpatient Medications  Medication Sig Dispense Refill  . albuterol (PROVENTIL HFA;VENTOLIN HFA) 108 (90 Base) MCG/ACT inhaler Inhale 2 puffs into the lungs every 6 (six) hours as needed for wheezing or shortness of breath. 1 Inhaler 6  . amLODipine (NORVASC) 10 MG tablet Take 5 mg by mouth daily.     . benazepril (LOTENSIN) 10 MG tablet Take 10 mg by mouth daily.    Marland Kitchen dimenhyDRINATE (DRAMAMINE) 50 MG tablet Take 50 mg by mouth every 8 (eight) hours as needed for dizziness.     . Fluticasone-Umeclidin-Vilant (TRELEGY ELLIPTA) 100-62.5-25 MCG/INH AEPB Inhale 1 puff into the lungs daily. 60 each 5  . lidocaine-prilocaine (EMLA) cream Apply 1 application topically as needed. 30 g 0  . pantoprazole (PROTONIX) 40 MG tablet Take 40 mg by mouth 2 (two) times daily.    . prochlorperazine (COMPAZINE) 10 MG tablet TAKE ONE TABLET BY MOUTH EVERY 6 HOURS AS NEEDED FOR NAUSEA OR VOMITING 30 tablet 0   No current facility-administered medications for this visit.    Facility-Administered Medications Ordered in Other Visits  Medication Dose Route Frequency Provider Last Rate Last Dose  . 0.9 %  sodium chloride infusion   Intravenous Once Robert Bears, MD      . Hunt Oris (  IMFINZI) 740 mg in sodium chloride 0.9 % 100 mL chemo infusion  9.4 mg/kg (Treatment Plan Recorded) Intravenous Once Robert Bears, MD      . heparin lock flush 100 unit/mL  500 Units Intracatheter Once PRN Robert Bears, MD      . sodium chloride flush (NS) 0.9 % injection 10 mL  10 mL Intracatheter PRN Robert Bears, MD      . sodium chloride flush (NS) 0.9 % injection 10 mL  10 mL Intracatheter  PRN Robert Bears, MD   10 mL at 05/20/19 1232  . sodium chloride flush (NS) 0.9 % injection 10 mL  10 mL Intracatheter PRN Robert Bears, MD        SURGICAL HISTORY:  Past Surgical History:  Procedure Laterality Date  . COLONOSCOPY W/ POLYPECTOMY    . IR IMAGING GUIDED PORT INSERTION  03/14/2019  . TONSILLECTOMY    . VASECTOMY    . VIDEO BRONCHOSCOPY WITH ENDOBRONCHIAL ULTRASOUND N/A 12/06/2018   Procedure: VIDEO BRONCHOSCOPY WITH ENDOBRONCHIAL ULTRASOUND;  Surgeon: Garner Nash, DO;  Location: [facility] OR;  Service: Thoracic;  Laterality: N/A;    REVIEW OF SYSTEMS:   Review of Systems  Constitutional: Positive for appetite change. Negative for chills, fatigue, fever and unexpected weight change.  HENT: Negative for mouth sores, nosebleeds, sore throat and trouble swallowing.   Eyes: Negative for eye problems and icterus.  Respiratory: Positive for baseline shortness of breath and cough. Negative for hemoptysis, and wheezing.   Cardiovascular: Negative for chest pain and leg swelling.  Gastrointestinal: Negative for abdominal pain, constipation, diarrhea, nausea and vomiting.  Genitourinary: Negative for bladder incontinence, difficulty urinating, dysuria, frequency and hematuria.   Musculoskeletal: Negative for back pain, gait problem, neck pain and neck stiffness.  Skin: Negative for itching and rash.  Neurological: Negative for dizziness, extremity weakness, gait problem, headaches, light-headedness and seizures.  Hematological: Negative for adenopathy. Does not bruise/bleed easily.  Psychiatric/Behavioral: Negative for confusion, depression and sleep disturbance. The patient is not nervous/anxious.     PHYSICAL EXAMINATION:  Blood pressure (!) 114/49, pulse 89, temperature 98.7 F (37.1 C), temperature source Oral, resp. rate 18, height 5\' 8"  (1.727 m), weight 166 lb 9.6 oz (75.6 kg), SpO2 97 %.  ECOG PERFORMANCE STATUS: 1 - Symptomatic but completely ambulatory  Physical  Exam  Constitutional: Oriented to person, place, and time and well-developed,  and in no distress.  HENT:  Head: Normocephalic and atraumatic.  Mouth/Throat: Oropharynx is clear and moist. No oropharyngeal exudate.  Eyes: Conjunctivae are normal. Right eye exhibits no discharge. Left eye exhibits no discharge. No scleral icterus.  Neck: Normal range of motion. Neck supple.  Cardiovascular: Systolic murmur noted. Normal rate, regular rhythm, and intact distal pulses.   Pulmonary/Chest: Effort normal. Decreased breath sounds throughout all lung fields. No respiratory distress. No wheezes. No rales.  Abdominal: Soft. Bowel sounds are normal. Exhibits no distension and no mass. There is no tenderness.  Musculoskeletal: Normal range of motion. Exhibits no edema.  Lymphadenopathy:    No cervical adenopathy.  Neurological: Alert and oriented to person, place, and time. Exhibits normal muscle tone. Gait normal. Coordination normal.  Skin: Skin is warm and dry. No rash noted. Not diaphoretic. No erythema. No pallor.  Psychiatric: Mood, memory and judgment normal.  Vitals reviewed.  LABORATORY DATA: Lab Results  Component Value Date   WBC 5.2 07/15/2019   HGB 9.3 (L) 07/15/2019   HCT 27.5 (L) 07/15/2019   MCV 121.7 (H) 07/15/2019   PLT  224 07/15/2019      Chemistry      Component Value Date/Time   NA 141 07/15/2019 1030   K 4.2 07/15/2019 1030   CL 111 07/15/2019 1030   CO2 21 (L) 07/15/2019 1030   BUN 14 07/15/2019 1030   CREATININE 1.00 07/15/2019 1030      Component Value Date/Time   CALCIUM 8.6 (L) 07/15/2019 1030   ALKPHOS 96 07/15/2019 1030   AST 15 07/15/2019 1030   ALT 13 07/15/2019 1030   BILITOT 1.0 07/15/2019 1030       RADIOGRAPHIC STUDIES:  No results found.   ASSESSMENT/PLAN:  This is a very pleasant 78 year old Caucasian male diagnosed with stage IIIb non-small cell lung cancer, adenocarcinoma of the left lower lobe. He was diagnosed in January 2020. He  has no actionable mutations.   The patient underwent concurrent chemoradiation with carboplatin for an AUC of 2 and paclitaxel 45 mg/m. He is status post 5 cycles. He was unable to proceed with cycle #6 secondary to thrombocytopenia.   The patient is currently undergoing consolidation immunotherapy with Imfinzi 10 mg/kg IV every 2 weeks. Status post8cycles. He has been tolerating treatment well without any adverse effects.   The patient was seen with Dr. Julien Nordmann today. Labs were reviewed. We recommend that he proceed with cycle #9 today as scheduled.   We will see him back for a follow up visit in 2 weeks for evaluation before starting cycle #10.   The patient inquired about his general health maintenance screenings/immunizations. We discussed that we recommend for him to follow the guidelines for vaccinations such as his annual flu shot which can be administered here in the cancer center once available. There is no restriction on other routine visits for his dental cleanings, eye doctor, and routine colonoscopies.   The patient was advised to call immediately if he has any concerning symptoms in the interval. The patient voices understanding of current disease status and treatment options and is in agreement with the current care plan. All questions were answered. The patient knows to call the clinic with any problems, questions or concerns. We can certainly see the patient much sooner if necessary  No orders of the defined types were placed in this encounter.    Cai Flott L Maricus Tanzi, PA-C 07/15/19   ADDENDUM: Hematology/Oncology Attending: I had a face-to-face encounter with the patient today.  I recommended his care plan.  This is a very pleasant 78 year old white male diagnosed with a stage IIIb non-small cell lung cancer, adenocarcinoma status post induction concurrent chemoradiation with weekly carboplatin and paclitaxel with partial response.  The patient is currently  undergoing consolidation treatment with immunotherapy with Imfinzi status post 8 cycles.  He has been tolerating this treatment well except for the baseline fatigue and shortness of breath. I recommended for the patient to proceed with cycle #9 today as planned. He was advised to keep his appointment for preventive care and screening as well as immunization by his primary care physician and gastroenterologist as planned. He will come back for follow-up visit in 2 weeks for evaluation before the next cycle of his treatment. The patient was advised to call immediately if he has any concerning symptoms in the interval.  Disclaimer: This note was dictated with voice recognition software. Similar sounding words can inadvertently be transcribed and may be missed upon review. Eilleen Kempf, MD 07/15/19

## 2019-07-15 NOTE — Patient Instructions (Signed)

## 2019-07-15 NOTE — Telephone Encounter (Signed)
Scheduled appt per 8/18 los - added additional cycles - pt to get an updated schedule next visit .

## 2019-07-24 ENCOUNTER — Telehealth: Payer: Self-pay | Admitting: Pulmonary Disease

## 2019-07-24 NOTE — Telephone Encounter (Signed)
Spoke with the pt  He is requesting order for a more light weight portable o2 system  He states that we walked him on POC here last time but he did not qualify  I do not see record of a walk  Do you want to try and order POC for him? Please advise thanks

## 2019-07-24 NOTE — Telephone Encounter (Signed)
PCCM:  Yes, ok to prescribe and qualify him for POC.   [facility] 07/24/2019 5:46 PM

## 2019-07-25 NOTE — Telephone Encounter (Signed)
Called and spoke to patient.  Patient stated he went to an [employer] store and did a best fit but never heard back.  Called and spoke to [facility] with [employer].  She is going to look into report from best fit attempt and will call us back and advise to patient's next step.

## 2019-07-25 NOTE — Telephone Encounter (Signed)
LMTCB

## 2019-07-25 NOTE — Telephone Encounter (Signed)
Spoke with Robert Dixon  Had best fit in July 2020 and was rec for POC She thinks that they may have not had them in stock at that time but they do now and pt can have one  [facility] will be reaching out to the pt   Spoke with the pt and notified of this

## 2019-07-25 NOTE — Telephone Encounter (Signed)
Robert Dixon from [facility] returning phone call.  Robert Dixon phone number is [phone].

## 2019-07-28 ENCOUNTER — Telehealth: Payer: Self-pay | Admitting: Pulmonary Disease

## 2019-07-28 NOTE — Telephone Encounter (Signed)
Called and spoke with pt. Pt states he received a bill in the mail from April 2020 in regards to his oxygen concentrator. Pt uses [employer] for his DME company and states he has called them several times and each rep he has spoken to has told him the same thing: "this bill was sent to [facility] and was rejected because it wasn't approved by a doctor."   I let pt know I would get this message routed to our PCCs who would know more about these situations than I. Pt verbalized understanding.   PCCs, can you help with this? Thank you.

## 2019-07-29 ENCOUNTER — Inpatient Hospital Stay: Payer: Medicare HMO | Attending: Internal Medicine

## 2019-07-29 ENCOUNTER — Encounter: Payer: Self-pay | Admitting: Internal Medicine

## 2019-07-29 ENCOUNTER — Inpatient Hospital Stay (HOSPITAL_BASED_OUTPATIENT_CLINIC_OR_DEPARTMENT_OTHER): Payer: Medicare HMO | Admitting: Internal Medicine

## 2019-07-29 ENCOUNTER — Inpatient Hospital Stay: Payer: Medicare HMO

## 2019-07-29 ENCOUNTER — Other Ambulatory Visit: Payer: Self-pay

## 2019-07-29 VITALS — HR 98

## 2019-07-29 VITALS — BP 127/62 | HR 109 | Temp 98.0°F | Resp 18 | Ht 68.0 in | Wt 166.2 lb

## 2019-07-29 DIAGNOSIS — Z5112 Encounter for antineoplastic immunotherapy: Secondary | ICD-10-CM

## 2019-07-29 DIAGNOSIS — J449 Chronic obstructive pulmonary disease, unspecified: Secondary | ICD-10-CM | POA: Insufficient documentation

## 2019-07-29 DIAGNOSIS — C3492 Malignant neoplasm of unspecified part of left bronchus or lung: Secondary | ICD-10-CM

## 2019-07-29 DIAGNOSIS — Z23 Encounter for immunization: Secondary | ICD-10-CM | POA: Insufficient documentation

## 2019-07-29 DIAGNOSIS — Z95828 Presence of other vascular implants and grafts: Secondary | ICD-10-CM

## 2019-07-29 DIAGNOSIS — Z9221 Personal history of antineoplastic chemotherapy: Secondary | ICD-10-CM | POA: Insufficient documentation

## 2019-07-29 DIAGNOSIS — R5383 Other fatigue: Secondary | ICD-10-CM | POA: Insufficient documentation

## 2019-07-29 DIAGNOSIS — C3430 Malignant neoplasm of lower lobe, unspecified bronchus or lung: Secondary | ICD-10-CM | POA: Diagnosis not present

## 2019-07-29 DIAGNOSIS — Z9981 Dependence on supplemental oxygen: Secondary | ICD-10-CM | POA: Diagnosis not present

## 2019-07-29 DIAGNOSIS — Z923 Personal history of irradiation: Secondary | ICD-10-CM | POA: Diagnosis not present

## 2019-07-29 DIAGNOSIS — H9201 Otalgia, right ear: Secondary | ICD-10-CM | POA: Diagnosis not present

## 2019-07-29 DIAGNOSIS — K219 Gastro-esophageal reflux disease without esophagitis: Secondary | ICD-10-CM | POA: Diagnosis not present

## 2019-07-29 DIAGNOSIS — R0609 Other forms of dyspnea: Secondary | ICD-10-CM | POA: Insufficient documentation

## 2019-07-29 DIAGNOSIS — I10 Essential (primary) hypertension: Secondary | ICD-10-CM | POA: Diagnosis not present

## 2019-07-29 DIAGNOSIS — Z79899 Other long term (current) drug therapy: Secondary | ICD-10-CM | POA: Diagnosis not present

## 2019-07-29 LAB — CMP (CANCER CENTER ONLY)
ALT: 13 U/L (ref 0–44)
AST: 16 U/L (ref 15–41)
Albumin: 3.9 g/dL (ref 3.5–5.0)
Alkaline Phosphatase: 97 U/L (ref 38–126)
Anion gap: 10 (ref 5–15)
BUN: 17 mg/dL (ref 8–23)
CO2: 20 mmol/L — ABNORMAL LOW (ref 22–32)
Calcium: 8.6 mg/dL — ABNORMAL LOW (ref 8.9–10.3)
Chloride: 109 mmol/L (ref 98–111)
Creatinine: 1.09 mg/dL (ref 0.61–1.24)
GFR, Est AFR Am: >60 mL/min (ref >60)
GFR, Estimated: >60 mL/min (ref >60)
Glucose, Bld: 154 mg/dL — ABNORMAL HIGH (ref 70–99)
Potassium: 4.2 mmol/L (ref 3.5–5.1)
Sodium: 139 mmol/L (ref 135–145)
Total Bilirubin: 1 mg/dL (ref 0.3–1.2)
Total Protein: 6.3 g/dL — ABNORMAL LOW (ref 6.5–8.1)

## 2019-07-29 LAB — CBC WITH DIFFERENTIAL (CANCER CENTER ONLY)
Abs Immature Granulocytes: 0.03 10*3/uL (ref 0.00–0.07)
Basophils Absolute: 0.1 10*3/uL (ref 0.0–0.1)
Basophils Relative: 1 %
Eosinophils Absolute: 0.1 10*3/uL (ref 0.0–0.5)
Eosinophils Relative: 1 %
HCT: 26.8 % — ABNORMAL LOW (ref 39.0–52.0)
Hemoglobin: 8.9 g/dL — ABNORMAL LOW (ref 13.0–17.0)
Immature Granulocytes: 1 %
Lymphocytes Relative: 13 %
Lymphs Abs: 0.8 10*3/uL (ref 0.7–4.0)
MCH: 41.6 pg — ABNORMAL HIGH (ref 26.0–34.0)
MCHC: 33.2 g/dL (ref 30.0–36.0)
MCV: 125.2 fL — ABNORMAL HIGH (ref 80.0–100.0)
Monocytes Absolute: 0.6 10*3/uL (ref 0.1–1.0)
Monocytes Relative: 9 %
Neutro Abs: 4.4 10*3/uL (ref 1.7–7.7)
Neutrophils Relative %: 75 %
Platelet Count: 243 10*3/uL (ref 150–400)
RBC: 2.14 MIL/uL — ABNORMAL LOW (ref 4.22–5.81)
RDW: 16.4 % — ABNORMAL HIGH (ref 11.5–15.5)
WBC Count: 5.9 10*3/uL (ref 4.0–10.5)
nRBC: 0 % (ref 0.0–0.2)

## 2019-07-29 MED ORDER — HEPARIN SOD (PORK) LOCK FLUSH 100 UNIT/ML IV SOLN
500.0000 [IU] | Freq: Once | INTRAVENOUS | Status: AC | PRN
  Administered 2019-07-29: 500 [IU]
  Filled 2019-07-29: qty 5

## 2019-07-29 MED ORDER — SODIUM CHLORIDE 0.9% FLUSH
10.0000 mL | INTRAVENOUS | Status: DC | PRN
  Administered 2019-07-29: 10 mL
  Filled 2019-07-29: qty 10

## 2019-07-29 MED ORDER — SODIUM CHLORIDE 0.9 % IV SOLN
9.4000 mg/kg | Freq: Once | INTRAVENOUS | Status: AC
  Administered 2019-07-29: 740 mg via INTRAVENOUS
  Filled 2019-07-29: qty 10

## 2019-07-29 MED ORDER — SODIUM CHLORIDE 0.9 % IV SOLN
Freq: Once | INTRAVENOUS | Status: AC
  Administered 2019-07-29: 11:00:00 via INTRAVENOUS
  Filled 2019-07-29: qty 250

## 2019-07-29 NOTE — Patient Instructions (Signed)

## 2019-07-29 NOTE — Progress Notes (Signed)
[facility] [phone]   [phone]  OFFICE PROGRESS NOTE  [name], MD [address] [place] [address]  DIAGNOSIS: Stage IIIB (T1b, N3, M0) non-small cell lung cancer favoring adenocarcinoma diagnosed in January 2020 and presented with left lower lobe pulmonary nodule in addition to bilateral hilar and subcarinal lymphadenopathy.  Molecular studies by guardant 360 showed no actionable mutations.  PRIOR THERAPY: Concurrent chemoradiation with weekly carboplatin for AUC of 2 and paclitaxel 45 mg/M2. First dose February 4th, 2020. Status post 5 cycles.   CURRENT THERAPY:  Consolidation immunotherapy with Imfinzi 10 mg/KG every 2 weeks, status post 9 cycles.  INTERVAL HISTORY: Robert Dixon 78 y.o. male returns to the clinic today for follow-up visit.  The patient is feeling fine today with no concerning complaints except for the baseline shortness of breath increased with exertion.  He is currently on home oxygen.  He noticed some black spots around the left nipple in the last few weeks.  On exam it looks like seborrheic keratosis. He denied having any chest pain, cough or hemoptysis.  He denied having any recent weight loss or night sweats.  He has no nausea, vomiting, diarrhea or constipation.  He has no headache or visual changes.  He is here today for evaluation before starting cycle #10 of his treatment.  MEDICAL HISTORY: Past Medical History:  Diagnosis Date  . Cancer ([place])    skin-   . Complication of anesthesia   . COPD (chronic obstructive pulmonary disease) ([place])   . Dyspnea   . GERD (gastroesophageal reflux disease)   . Hemoptysis 11/20/2018  . History of hiatal hernia   . Hypertension   . Hypoxia 11/20/2018  . 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 Migraine with visual aura   . Non-small cell lung cancer ([place]) dx'd 11/20/18  . Pneumonia 11/20/2018  . PONV (postoperative nausea and vomiting)   .  Pulmonary nodule 11/20/2018    ALLERGIES:  has No Known Allergies.  MEDICATIONS:  Current Outpatient Medications  Medication Sig Dispense Refill  . albuterol (PROVENTIL HFA;VENTOLIN HFA) 108 (90 Base) MCG/ACT inhaler Inhale 2 puffs into the lungs every 6 (six) hours as needed for wheezing or shortness of breath. 1 Inhaler 6  . benazepril (LOTENSIN) 10 MG tablet Take 10 mg by mouth daily.    Marland Kitchen dimenhyDRINATE (DRAMAMINE) 50 MG tablet Take 50 mg by mouth every 8 (eight) hours as needed for dizziness.     . Fluticasone-Umeclidin-Vilant (TRELEGY ELLIPTA) 100-62.5-25 MCG/INH AEPB Inhale 1 puff into the lungs daily. 60 each 5  . lidocaine-prilocaine (EMLA) cream Apply 1 application topically as needed. 30 g 0  . pantoprazole (PROTONIX) 40 MG tablet Take 40 mg by mouth 2 (two) times daily.    . prochlorperazine (COMPAZINE) 10 MG tablet TAKE ONE TABLET BY MOUTH EVERY 6 HOURS AS NEEDED FOR NAUSEA OR VOMITING 30 tablet 0   No current facility-administered medications for this visit.    Facility-Administered Medications Ordered in Other Visits  Medication Dose Route Frequency Provider Last Rate Last Dose  . sodium chloride flush (NS) 0.9 % injection 10 mL  10 mL Intracatheter PRN [name], MD      . sodium chloride flush (NS) 0.9 % injection 10 mL  10 mL Intracatheter PRN [name], MD   10 mL at 05/20/19 1232    SURGICAL HISTORY:  Past Surgical History:  Procedure Laterality Date  . COLONOSCOPY W/ POLYPECTOMY    .  IR IMAGING GUIDED PORT INSERTION  03/14/2019  . TONSILLECTOMY    . VASECTOMY    . VIDEO BRONCHOSCOPY WITH ENDOBRONCHIAL ULTRASOUND N/A 12/06/2018   Procedure: VIDEO BRONCHOSCOPY WITH ENDOBRONCHIAL ULTRASOUND;  Surgeon: [place] Nash, DO;  Location: [facility] OR;  Service: Thoracic;  Laterality: N/A;    REVIEW OF SYSTEMS:  A comprehensive review of systems was negative except for: Constitutional: positive for fatigue Respiratory: positive for dyspnea on exertion    PHYSICAL EXAMINATION: General appearance: alert, cooperative, fatigued and no distress Head: Normocephalic, without obvious abnormality, atraumatic Neck: no adenopathy, no JVD, supple, symmetrical, trachea midline and thyroid not enlarged, symmetric, no tenderness/mass/nodules Lymph nodes: Cervical, supraclavicular, and axillary nodes normal. Resp: clear to auscultation bilaterally Back: symmetric, no curvature. ROM normal. No CVA tenderness. Cardio: regular rate and rhythm, S1, S2 normal, no murmur, click, rub or gallop GI: soft, non-tender; bowel sounds normal; no masses,  no organomegaly Extremities: extremities normal, atraumatic, no cyanosis or edema  ECOG PERFORMANCE STATUS: 1 - Symptomatic but completely ambulatory  Blood pressure 127/62, pulse (!) 109, temperature 98 F (36.7 C), temperature source Oral, resp. rate 18, height 5\' 8"  (1.727 m), weight 166 lb 3.2 oz (75.4 kg), SpO2 94 %.  LABORATORY DATA: Lab Results  Component Value Date   WBC 5.9 07/29/2019   HGB 8.9 (L) 07/29/2019   HCT 26.8 (L) 07/29/2019   MCV 125.2 (H) 07/29/2019   PLT 243 07/29/2019      Chemistry      Component Value Date/Time   NA 139 07/29/2019 0955   K 4.2 07/29/2019 0955   CL 109 07/29/2019 0955   CO2 20 (L) 07/29/2019 0955   BUN 17 07/29/2019 0955   CREATININE 1.09 07/29/2019 0955      Component Value Date/Time   CALCIUM 8.6 (L) 07/29/2019 0955   ALKPHOS 97 07/29/2019 0955   AST 16 07/29/2019 0955   ALT 13 07/29/2019 0955   BILITOT 1.0 07/29/2019 0955       RADIOGRAPHIC STUDIES: No results found.  ASSESSMENT AND PLAN: This is a very pleasant 78 years old white male diagnosed with stage IIIb non-small cell lung cancer, adenocarcinoma with no actionable mutations  Completed the course of concurrent chemoradiation with weekly carboplatin and paclitaxel status post 5 cycles.  He has partial response to this treatment. The patient is currently undergoing consolidation treatment with  immunotherapy with Imfinzi status post 9 cycles.  The patient has been tolerating this treatment well with no concerning adverse effects. I recommended for him to proceed with cycle #10 today as planned. He will come back for follow-up visit in 2 weeks for evaluation before the next cycle of his treatment. The patient was advised to call immediately if he has any concerning symptoms in the interval. The patient voices understanding of current disease status and treatment options and is in agreement with the current care plan.  All questions were answered. The patient knows to call the clinic with any problems, questions or concerns. We can certainly see the patient much sooner if necessary.  Disclaimer: This note was dictated with voice recognition software. Similar sounding words can inadvertently be transcribed and may not be corrected upon review.

## 2019-07-29 NOTE — Telephone Encounter (Signed)
I have called [employer] spoke to [facility] she is going to have the billing dept call the patient aware of this

## 2019-07-29 NOTE — Patient Instructions (Signed)
[facility] Discharge Instructions for Patients Receiving Chemotherapy  Today you received the following chemotherapy agents: Imfinzi.  To help prevent nausea and vomiting after your treatment, we encourage you to take your nausea medication as directed.   If you develop nausea and vomiting that is not controlled by your nausea medication, call the clinic.   BELOW ARE SYMPTOMS THAT SHOULD BE REPORTED IMMEDIATELY:  *FEVER GREATER THAN 100.5 F  *CHILLS WITH OR WITHOUT FEVER  NAUSEA AND VOMITING THAT IS NOT CONTROLLED WITH YOUR NAUSEA MEDICATION  *UNUSUAL SHORTNESS OF BREATH  *UNUSUAL BRUISING OR BLEEDING  TENDERNESS IN MOUTH AND THROAT WITH OR WITHOUT PRESENCE OF ULCERS  *URINARY PROBLEMS  *BOWEL PROBLEMS  UNUSUAL RASH Items with * indicate a potential emergency and should be followed up as soon as possible.  Feel free to call the clinic should you have any questions or concerns. The clinic phone number is [phone].  Please show the CHEMO ALERT CARD at check-in to the Emergency Department and triage nurse.   

## 2019-07-30 ENCOUNTER — Telehealth: Payer: Self-pay | Admitting: Radiation Oncology

## 2019-07-30 DIAGNOSIS — J439 Emphysema, unspecified: Secondary | ICD-10-CM | POA: Diagnosis not present

## 2019-07-30 NOTE — Telephone Encounter (Signed)
Confirmed appt and verified info. °

## 2019-07-31 ENCOUNTER — Telehealth: Payer: Self-pay | Admitting: Internal Medicine

## 2019-07-31 ENCOUNTER — Telehealth: Payer: Self-pay | Admitting: Radiation Oncology

## 2019-07-31 ENCOUNTER — Ambulatory Visit: Admission: RE | Admit: 2019-07-31 | Payer: Medicare HMO | Source: Ambulatory Visit

## 2019-07-31 ENCOUNTER — Ambulatory Visit: Admission: RE | Admit: 2019-07-31 | Payer: Medicare HMO | Source: Ambulatory Visit | Admitting: Radiation Oncology

## 2019-07-31 ENCOUNTER — Encounter: Payer: Self-pay | Admitting: Physician Assistant

## 2019-07-31 NOTE — Telephone Encounter (Signed)
New message:    Cld patient to notify him that his appt is not needed for today per Bryson Ha. Lft voicemail for patient to call back if he has any additional questions.

## 2019-07-31 NOTE — Telephone Encounter (Signed)
Patient already on schedule q2w as requested per 9/1 los through October.

## 2019-08-12 ENCOUNTER — Other Ambulatory Visit: Payer: Self-pay

## 2019-08-12 ENCOUNTER — Inpatient Hospital Stay: Payer: Medicare HMO

## 2019-08-12 ENCOUNTER — Telehealth: Payer: Self-pay | Admitting: Internal Medicine

## 2019-08-12 ENCOUNTER — Inpatient Hospital Stay (HOSPITAL_BASED_OUTPATIENT_CLINIC_OR_DEPARTMENT_OTHER): Payer: Medicare HMO | Admitting: Physician Assistant

## 2019-08-12 VITALS — BP 112/55 | HR 99 | Temp 98.5°F | Resp 18 | Ht 68.0 in | Wt 166.0 lb

## 2019-08-12 DIAGNOSIS — C3492 Malignant neoplasm of unspecified part of left bronchus or lung: Secondary | ICD-10-CM

## 2019-08-12 DIAGNOSIS — R5383 Other fatigue: Secondary | ICD-10-CM | POA: Diagnosis not present

## 2019-08-12 DIAGNOSIS — R0609 Other forms of dyspnea: Secondary | ICD-10-CM | POA: Diagnosis not present

## 2019-08-12 DIAGNOSIS — Z923 Personal history of irradiation: Secondary | ICD-10-CM | POA: Diagnosis not present

## 2019-08-12 DIAGNOSIS — Z9221 Personal history of antineoplastic chemotherapy: Secondary | ICD-10-CM | POA: Diagnosis not present

## 2019-08-12 DIAGNOSIS — J449 Chronic obstructive pulmonary disease, unspecified: Secondary | ICD-10-CM | POA: Diagnosis not present

## 2019-08-12 DIAGNOSIS — Z23 Encounter for immunization: Secondary | ICD-10-CM | POA: Diagnosis not present

## 2019-08-12 DIAGNOSIS — Z95828 Presence of other vascular implants and grafts: Secondary | ICD-10-CM

## 2019-08-12 DIAGNOSIS — Z5112 Encounter for antineoplastic immunotherapy: Secondary | ICD-10-CM

## 2019-08-12 DIAGNOSIS — C3430 Malignant neoplasm of lower lobe, unspecified bronchus or lung: Secondary | ICD-10-CM | POA: Diagnosis not present

## 2019-08-12 DIAGNOSIS — H9201 Otalgia, right ear: Secondary | ICD-10-CM | POA: Diagnosis not present

## 2019-08-12 LAB — CMP (CANCER CENTER ONLY)
ALT: 9 U/L (ref 0–44)
AST: 15 U/L (ref 15–41)
Albumin: 3.8 g/dL (ref 3.5–5.0)
Alkaline Phosphatase: 95 U/L (ref 38–126)
Anion gap: 7 (ref 5–15)
BUN: 16 mg/dL (ref 8–23)
CO2: 23 mmol/L (ref 22–32)
Calcium: 8.6 mg/dL — ABNORMAL LOW (ref 8.9–10.3)
Chloride: 110 mmol/L (ref 98–111)
Creatinine: 1.06 mg/dL (ref 0.61–1.24)
GFR, Est AFR Am: >60 mL/min (ref >60)
GFR, Estimated: >60 mL/min (ref >60)
Glucose, Bld: 96 mg/dL (ref 70–99)
Potassium: 4.3 mmol/L (ref 3.5–5.1)
Sodium: 140 mmol/L (ref 135–145)
Total Bilirubin: 0.9 mg/dL (ref 0.3–1.2)
Total Protein: 6.1 g/dL — ABNORMAL LOW (ref 6.5–8.1)

## 2019-08-12 LAB — CBC WITH DIFFERENTIAL (CANCER CENTER ONLY)
Abs Immature Granulocytes: 0.02 10*3/uL (ref 0.00–0.07)
Basophils Absolute: 0.1 10*3/uL (ref 0.0–0.1)
Basophils Relative: 2 %
Eosinophils Absolute: 0.1 10*3/uL (ref 0.0–0.5)
Eosinophils Relative: 1 %
HCT: 27.4 % — ABNORMAL LOW (ref 39.0–52.0)
Hemoglobin: 8.8 g/dL — ABNORMAL LOW (ref 13.0–17.0)
Immature Granulocytes: 0 %
Lymphocytes Relative: 12 %
Lymphs Abs: 0.7 10*3/uL (ref 0.7–4.0)
MCH: 41.7 pg — ABNORMAL HIGH (ref 26.0–34.0)
MCHC: 32.1 g/dL (ref 30.0–36.0)
MCV: 129.9 fL — ABNORMAL HIGH (ref 80.0–100.0)
Monocytes Absolute: 0.6 10*3/uL (ref 0.1–1.0)
Monocytes Relative: 10 %
Neutro Abs: 4.5 10*3/uL (ref 1.7–7.7)
Neutrophils Relative %: 75 %
Platelet Count: 249 10*3/uL (ref 150–400)
RBC: 2.11 MIL/uL — ABNORMAL LOW (ref 4.22–5.81)
RDW: 17 % — ABNORMAL HIGH (ref 11.5–15.5)
WBC Count: 6.1 10*3/uL (ref 4.0–10.5)
nRBC: 0 % (ref 0.0–0.2)

## 2019-08-12 LAB — TSH: TSH: 1.632 u[IU]/mL (ref 0.320–4.118)

## 2019-08-12 MED ORDER — INFLUENZA VAC A&B SA ADJ QUAD 0.5 ML IM PRSY
PREFILLED_SYRINGE | INTRAMUSCULAR | Status: AC
  Filled 2019-08-12: qty 0.5

## 2019-08-12 MED ORDER — INFLUENZA VAC A&B SA ADJ QUAD 0.5 ML IM PRSY
0.5000 mL | PREFILLED_SYRINGE | Freq: Once | INTRAMUSCULAR | Status: AC
  Administered 2019-08-12: 0.5 mL via INTRAMUSCULAR

## 2019-08-12 MED ORDER — SODIUM CHLORIDE 0.9 % IV SOLN
9.4000 mg/kg | Freq: Once | INTRAVENOUS | Status: AC
  Administered 2019-08-12: 740 mg via INTRAVENOUS
  Filled 2019-08-12: qty 10

## 2019-08-12 MED ORDER — SODIUM CHLORIDE 0.9 % IV SOLN
Freq: Once | INTRAVENOUS | Status: AC
  Administered 2019-08-12: 12:00:00 via INTRAVENOUS
  Filled 2019-08-12: qty 250

## 2019-08-12 MED ORDER — HEPARIN SOD (PORK) LOCK FLUSH 100 UNIT/ML IV SOLN
500.0000 [IU] | Freq: Once | INTRAVENOUS | Status: AC | PRN
  Administered 2019-08-12: 500 [IU]
  Filled 2019-08-12: qty 5

## 2019-08-12 MED ORDER — SODIUM CHLORIDE 0.9% FLUSH
10.0000 mL | INTRAVENOUS | Status: DC | PRN
  Administered 2019-08-12: 10 mL
  Filled 2019-08-12: qty 10

## 2019-08-12 NOTE — Progress Notes (Signed)
[place] OFFICE PROGRESS NOTE  [name], MD [address] [place] [address]  DIAGNOSIS: Stage IIIB(T1b, N3, M0) non-small cell lung cancer favoring adenocarcinoma diagnosed in January 2020 and presented with left lower lobe pulmonary nodule in addition to bilateral hilar and subcarinal lymphadenopathy  PRIOR THERAPY: Concurrent chemoradiation with weekly carboplatin for an AUC of 2 and paclitaxel 45 mg/m2. Status post 5 cycles.  CURRENT THERAPY: Consolidation immunotherapy with Imfinzi 10 mg/kg IV every 2 weeks. First dose March 25, 2019.Status post10cycles.  INTERVAL HISTORY: Robert Dixon 77 y.o. male returns to the clinic for a follow up visit. The patient is feeling well today without any concerning complaints. The patient continues to tolerate treatment with immunotherapy with Imfinzi well without any adverse effects. Denies any fever, chills, night sweats, or weight loss. Denies any chest pain or hemoptysis. He reports his baseline shortness of breath exertion and baseline cough secondary to his COPD for which he is on 2 L of oxygen via nasal cannula. Denies any nausea, vomiting, diarrhea, or constipation. Denies any headache or visual changes. Denies any rashes or skin changes. The patient is here today for evaluation prior to starting cycle # 11  MEDICAL HISTORY: Past Medical History:  Diagnosis Date  . Cancer ([place])    skin-   . Complication of anesthesia   . COPD (chronic obstructive pulmonary disease) ([place])   . Dyspnea   . GERD (gastroesophageal reflux disease)   . Hemoptysis 11/20/2018  . History of hiatal hernia   . Hypertension   . Hypoxia 11/20/2018  . 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 Migraine with visual aura   . Non-small cell lung cancer ([place]) dx'd 11/20/18  . Pneumonia 11/20/2018  . PONV (postoperative nausea and vomiting)   . Pulmonary nodule 11/20/2018    ALLERGIES:  has No Known Allergies.  MEDICATIONS:   Current Outpatient Medications  Medication Sig Dispense Refill  . albuterol (PROVENTIL HFA;VENTOLIN HFA) 108 (90 Base) MCG/ACT inhaler Inhale 2 puffs into the lungs every 6 (six) hours as needed for wheezing or shortness of breath. 1 Inhaler 6  . benazepril (LOTENSIN) 10 MG tablet Take 10 mg by mouth daily.    Marland Kitchen dimenhyDRINATE (DRAMAMINE) 50 MG tablet Take 50 mg by mouth every 8 (eight) hours as needed for dizziness.     . Fluticasone-Umeclidin-Vilant (TRELEGY ELLIPTA) 100-62.5-25 MCG/INH AEPB Inhale 1 puff into the lungs daily. 60 each 5  . lidocaine-prilocaine (EMLA) cream Apply 1 application topically as needed. 30 g 0  . pantoprazole (PROTONIX) 40 MG tablet Take 40 mg by mouth 2 (two) times daily.    . prochlorperazine (COMPAZINE) 10 MG tablet TAKE ONE TABLET BY MOUTH EVERY 6 HOURS AS NEEDED FOR NAUSEA OR VOMITING 30 tablet 0   No current facility-administered medications for this visit.    Facility-Administered Medications Ordered in Other Visits  Medication Dose Route Frequency Provider Last Rate Last Dose  . sodium chloride flush (NS) 0.9 % injection 10 mL  10 mL Intracatheter PRN [name], MD      . sodium chloride flush (NS) 0.9 % injection 10 mL  10 mL Intracatheter PRN [name], MD   10 mL at 05/20/19 1232  . sodium chloride flush (NS) 0.9 % injection 10 mL  10 mL Intracatheter PRN [name], MD   10 mL at 08/12/19 1417    SURGICAL HISTORY:  Past Surgical History:  Procedure Laterality Date  . COLONOSCOPY W/ POLYPECTOMY    .  IR IMAGING GUIDED PORT INSERTION  03/14/2019  . TONSILLECTOMY    . VASECTOMY    . VIDEO BRONCHOSCOPY WITH ENDOBRONCHIAL ULTRASOUND N/A 12/06/2018   Procedure: VIDEO BRONCHOSCOPY WITH ENDOBRONCHIAL ULTRASOUND;  Surgeon: [place] Nash, DO;  Location: [facility] OR;  Service: Thoracic;  Laterality: N/A;    REVIEW OF SYSTEMS:   Review of Systems  Constitutional: Negative for appetite change, chills, fatigue, fever and unexpected weight  change.  HENT:   Negative for mouth sores, nosebleeds, sore throat and trouble swallowing.   Eyes: Negative for eye problems and icterus.  Respiratory: Positive for baseline shortness of breath and cough. Negative for hemoptysis, and wheezing.   Cardiovascular: Negative for chest pain and leg swelling.  Gastrointestinal: Negative for abdominal pain, constipation, diarrhea, nausea and vomiting.  Genitourinary: Negative for bladder incontinence, difficulty urinating, dysuria, frequency and hematuria.   Musculoskeletal: Negative for back pain, gait problem, neck pain and neck stiffness.  Skin: Negative for itching and rash.  Neurological: Negative for dizziness, extremity weakness, gait problem, headaches, light-headedness and seizures.  Hematological: Negative for adenopathy. Does not bruise/bleed easily.  Psychiatric/Behavioral: Negative for confusion, depression and sleep disturbance. The patient is not nervous/anxious.     PHYSICAL EXAMINATION:  Blood pressure (!) 112/55, pulse 99, temperature 98.5 F (36.9 C), temperature source Temporal, resp. rate 18, height 5\' 8"  (1.727 m), weight 166 lb (75.3 kg), SpO2 93 %.  ECOG PERFORMANCE STATUS: 1 - Symptomatic but completely ambulatory  Physical Exam  Constitutional: Oriented to person, place, and time and well-developed, well-nourished, and in no distress. On 2L of oxygen via nasal cannula. HENT:  Head: Normocephalic and atraumatic.  Mouth/Throat: Oropharynx is clear and moist. No oropharyngeal exudate.  Eyes: Conjunctivae are normal. Right eye exhibits no discharge. Left eye exhibits no discharge. No scleral icterus.  Neck: Normal range of motion. Neck supple.  Cardiovascular:  Systolic murmur noted. Normal rate, regular rhythm, and intact distal pulses.   Pulmonary/Chest: Effort normal and breath sounds normal. No respiratory distress. No wheezes. No rales.  Abdominal: Soft. Bowel sounds are normal. Exhibits no distension and no mass.  There is no tenderness.  Musculoskeletal: Normal range of motion. Exhibits no edema.  Lymphadenopathy:    No cervical adenopathy.  Neurological: Alert and oriented to person, place, and time. Exhibits normal muscle tone. Gait normal. Coordination normal.  Skin: Skin is warm and dry. No rash noted. Not diaphoretic. No erythema. No pallor.  Psychiatric: Mood, memory and judgment normal.  Vitals reviewed.  LABORATORY DATA: Lab Results  Component Value Date   WBC 6.1 08/12/2019   HGB 8.8 (L) 08/12/2019   HCT 27.4 (L) 08/12/2019   MCV 129.9 (H) 08/12/2019   PLT 249 08/12/2019      Chemistry      Component Value Date/Time   NA 140 08/12/2019 0954   K 4.3 08/12/2019 0954   CL 110 08/12/2019 0954   CO2 23 08/12/2019 0954   BUN 16 08/12/2019 0954   CREATININE 1.06 08/12/2019 0954      Component Value Date/Time   CALCIUM 8.6 (L) 08/12/2019 0954   ALKPHOS 95 08/12/2019 0954   AST 15 08/12/2019 0954   ALT 9 08/12/2019 0954   BILITOT 0.9 08/12/2019 0954       RADIOGRAPHIC STUDIES:  No results found.   ASSESSMENT/PLAN:  This is a very pleasant 78 year old Caucasian male diagnosed with stage IIIb non-small cell lung cancer, adenocarcinoma of the left lower lobe. He was diagnosed in January 2020. He has no actionable  mutations.   The patient underwent concurrent chemoradiation with carboplatin for an AUC of 2 and paclitaxel 45 mg/m. He is status post 5 cycles. He was unable to proceed with cycle #6 secondary to thrombocytopenia.   The patient is currently undergoing consolidation immunotherapy with Imfinzi 10 mg/kg IV every 2 weeks. Status post 10cycles. He has been tolerating treatment well without any adverse effects.   Labs were reviewed. We recommend that he proceed with cycle #11 today as scheduled.   We will see him back for a follow up visit in 2 weeks for evaluation before starting cycle #12.   The patient will receive a flu shot while in the clinic today at  the patient's request.   The patient was advised to call immediately if he has any concerning symptoms in the interval. The patient voices understanding of current disease status and treatment options and is in agreement with the current care plan. All questions were answered. The patient knows to call the clinic with any problems, questions or concerns. We can certainly see the patient much sooner if necessary  No orders of the defined types were placed in this encounter.    Latreshia Beauchaine L Jordana Dugue, PA-C 08/12/19

## 2019-08-12 NOTE — Telephone Encounter (Signed)
Scheduled appt per 9/15 los - pt to get an updated schedule next visit. Added additional appts to whats already scheduled

## 2019-08-12 NOTE — Patient Instructions (Signed)
[facility] Discharge Instructions for Patients Receiving Chemotherapy  Today you received the following chemotherapy agents: Imfinzi.  To help prevent nausea and vomiting after your treatment, we encourage you to take your nausea medication as directed.   If you develop nausea and vomiting that is not controlled by your nausea medication, call the clinic.   BELOW ARE SYMPTOMS THAT SHOULD BE REPORTED IMMEDIATELY:  *FEVER GREATER THAN 100.5 F  *CHILLS WITH OR WITHOUT FEVER  NAUSEA AND VOMITING THAT IS NOT CONTROLLED WITH YOUR NAUSEA MEDICATION  *UNUSUAL SHORTNESS OF BREATH  *UNUSUAL BRUISING OR BLEEDING  TENDERNESS IN MOUTH AND THROAT WITH OR WITHOUT PRESENCE OF ULCERS  *URINARY PROBLEMS  *BOWEL PROBLEMS  UNUSUAL RASH Items with * indicate a potential emergency and should be followed up as soon as possible.  Feel free to call the clinic should you have any questions or concerns. The clinic phone number is [phone].  Please show the CHEMO ALERT CARD at check-in to the Emergency Department and triage nurse.   

## 2019-08-13 ENCOUNTER — Encounter: Payer: Self-pay | Admitting: Internal Medicine

## 2019-08-26 ENCOUNTER — Inpatient Hospital Stay: Payer: Medicare HMO

## 2019-08-26 ENCOUNTER — Other Ambulatory Visit: Payer: Self-pay

## 2019-08-26 ENCOUNTER — Inpatient Hospital Stay (HOSPITAL_BASED_OUTPATIENT_CLINIC_OR_DEPARTMENT_OTHER): Payer: Medicare HMO | Admitting: Internal Medicine

## 2019-08-26 VITALS — BP 119/54 | HR 94 | Temp 98.7°F | Resp 18 | Ht 68.0 in | Wt 165.2 lb

## 2019-08-26 DIAGNOSIS — Z9221 Personal history of antineoplastic chemotherapy: Secondary | ICD-10-CM | POA: Diagnosis not present

## 2019-08-26 DIAGNOSIS — Z5112 Encounter for antineoplastic immunotherapy: Secondary | ICD-10-CM

## 2019-08-26 DIAGNOSIS — C3492 Malignant neoplasm of unspecified part of left bronchus or lung: Secondary | ICD-10-CM

## 2019-08-26 DIAGNOSIS — Z923 Personal history of irradiation: Secondary | ICD-10-CM | POA: Diagnosis not present

## 2019-08-26 DIAGNOSIS — C3490 Malignant neoplasm of unspecified part of unspecified bronchus or lung: Secondary | ICD-10-CM | POA: Diagnosis not present

## 2019-08-26 DIAGNOSIS — C3430 Malignant neoplasm of lower lobe, unspecified bronchus or lung: Secondary | ICD-10-CM | POA: Diagnosis not present

## 2019-08-26 DIAGNOSIS — R0609 Other forms of dyspnea: Secondary | ICD-10-CM | POA: Diagnosis not present

## 2019-08-26 DIAGNOSIS — R5383 Other fatigue: Secondary | ICD-10-CM | POA: Diagnosis not present

## 2019-08-26 DIAGNOSIS — C3432 Malignant neoplasm of lower lobe, left bronchus or lung: Secondary | ICD-10-CM | POA: Diagnosis not present

## 2019-08-26 DIAGNOSIS — J449 Chronic obstructive pulmonary disease, unspecified: Secondary | ICD-10-CM | POA: Diagnosis not present

## 2019-08-26 DIAGNOSIS — Z95828 Presence of other vascular implants and grafts: Secondary | ICD-10-CM

## 2019-08-26 DIAGNOSIS — Z23 Encounter for immunization: Secondary | ICD-10-CM | POA: Diagnosis not present

## 2019-08-26 DIAGNOSIS — H9201 Otalgia, right ear: Secondary | ICD-10-CM | POA: Diagnosis not present

## 2019-08-26 LAB — CBC WITH DIFFERENTIAL (CANCER CENTER ONLY)
Abs Immature Granulocytes: 0.03 10*3/uL (ref 0.00–0.07)
Basophils Absolute: 0.1 10*3/uL (ref 0.0–0.1)
Basophils Relative: 1 %
Eosinophils Absolute: 0.1 10*3/uL (ref 0.0–0.5)
Eosinophils Relative: 1 %
HCT: 25.9 % — ABNORMAL LOW (ref 39.0–52.0)
Hemoglobin: 8.5 g/dL — ABNORMAL LOW (ref 13.0–17.0)
Immature Granulocytes: 1 %
Lymphocytes Relative: 10 %
Lymphs Abs: 0.6 10*3/uL — ABNORMAL LOW (ref 0.7–4.0)
MCH: 42.7 pg — ABNORMAL HIGH (ref 26.0–34.0)
MCHC: 32.8 g/dL (ref 30.0–36.0)
MCV: 130.2 fL — ABNORMAL HIGH (ref 80.0–100.0)
Monocytes Absolute: 0.6 10*3/uL (ref 0.1–1.0)
Monocytes Relative: 10 %
Neutro Abs: 4.4 10*3/uL (ref 1.7–7.7)
Neutrophils Relative %: 77 %
Platelet Count: 230 10*3/uL (ref 150–400)
RBC: 1.99 MIL/uL — ABNORMAL LOW (ref 4.22–5.81)
RDW: 16 % — ABNORMAL HIGH (ref 11.5–15.5)
WBC Count: 5.7 10*3/uL (ref 4.0–10.5)
nRBC: 0 % (ref 0.0–0.2)

## 2019-08-26 LAB — CMP (CANCER CENTER ONLY)
ALT: 10 U/L (ref 0–44)
AST: 15 U/L (ref 15–41)
Albumin: 3.7 g/dL (ref 3.5–5.0)
Alkaline Phosphatase: 98 U/L (ref 38–126)
Anion gap: 7 (ref 5–15)
BUN: 14 mg/dL (ref 8–23)
CO2: 24 mmol/L (ref 22–32)
Calcium: 8.3 mg/dL — ABNORMAL LOW (ref 8.9–10.3)
Chloride: 110 mmol/L (ref 98–111)
Creatinine: 0.99 mg/dL (ref 0.61–1.24)
GFR, Est AFR Am: >60 mL/min (ref >60)
GFR, Estimated: >60 mL/min (ref >60)
Glucose, Bld: 127 mg/dL — ABNORMAL HIGH (ref 70–99)
Potassium: 4.1 mmol/L (ref 3.5–5.1)
Sodium: 141 mmol/L (ref 135–145)
Total Bilirubin: 1.1 mg/dL (ref 0.3–1.2)
Total Protein: 5.9 g/dL — ABNORMAL LOW (ref 6.5–8.1)

## 2019-08-26 MED ORDER — SODIUM CHLORIDE 0.9 % IV SOLN
Freq: Once | INTRAVENOUS | Status: AC
  Administered 2019-08-26: 13:00:00 via INTRAVENOUS
  Filled 2019-08-26: qty 250

## 2019-08-26 MED ORDER — SODIUM CHLORIDE 0.9% FLUSH
10.0000 mL | INTRAVENOUS | Status: DC | PRN
  Administered 2019-08-26: 10 mL
  Filled 2019-08-26: qty 10

## 2019-08-26 MED ORDER — SODIUM CHLORIDE 0.9 % IV SOLN
9.4000 mg/kg | Freq: Once | INTRAVENOUS | Status: AC
  Administered 2019-08-26: 740 mg via INTRAVENOUS
  Filled 2019-08-26: qty 10

## 2019-08-26 MED ORDER — HEPARIN SOD (PORK) LOCK FLUSH 100 UNIT/ML IV SOLN
500.0000 [IU] | Freq: Once | INTRAVENOUS | Status: AC | PRN
  Administered 2019-08-26: 500 [IU]
  Filled 2019-08-26: qty 5

## 2019-08-26 NOTE — Progress Notes (Signed)
[facility] [phone]   [phone]  OFFICE PROGRESS NOTE  [name], MD [address] [place] [address]  DIAGNOSIS: Stage IIIB (T1b, N3, M0) non-small cell lung cancer favoring adenocarcinoma diagnosed in January 2020 and presented with left lower lobe pulmonary nodule in addition to bilateral hilar and subcarinal lymphadenopathy.  Molecular studies by guardant 360 showed no actionable mutations.  PRIOR THERAPY: Concurrent chemoradiation with weekly carboplatin for AUC of 2 and paclitaxel 45 mg/M2. First dose February 4th, 2020. Status post 5 cycles.   CURRENT THERAPY:  Consolidation immunotherapy with Imfinzi 10 mg/KG every 2 weeks, status post 11 cycles.  INTERVAL HISTORY: Robert Dixon 78 y.o. male returns to the clinic today for follow-up visit.  The patient is feeling fine today with no concerning complaints except for the baseline shortness of breath increased with exertion and he is currently on home oxygen.  He denied having any chest pain but has mild cough with no hemoptysis.  He denied having any recent weight loss or night sweats.  He has no nausea, vomiting, diarrhea or constipation.  He denied having any headache or visual changes.  He is here today for evaluation before starting cycle #12 of his treatment.  He has some intermittent right earache and he takes Tylenol for it and has been effective.  MEDICAL HISTORY: Past Medical History:  Diagnosis Date  . Cancer ([place])    skin-   . Complication of anesthesia   . COPD (chronic obstructive pulmonary disease) ([place])   . Dyspnea   . GERD (gastroesophageal reflux disease)   . Hemoptysis 11/20/2018  . History of hiatal hernia   . Hypertension   . Hypoxia 11/20/2018  . 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 Migraine with visual aura   . Non-small cell lung cancer ([place]) dx'd 11/20/18  . Pneumonia 11/20/2018  . PONV (postoperative nausea and vomiting)   .  Pulmonary nodule 11/20/2018    ALLERGIES:  has No Known Allergies.  MEDICATIONS:  Current Outpatient Medications  Medication Sig Dispense Refill  . albuterol (PROVENTIL HFA;VENTOLIN HFA) 108 (90 Base) MCG/ACT inhaler Inhale 2 puffs into the lungs every 6 (six) hours as needed for wheezing or shortness of breath. 1 Inhaler 6  . benazepril (LOTENSIN) 10 MG tablet Take 10 mg by mouth daily.    Marland Kitchen dimenhyDRINATE (DRAMAMINE) 50 MG tablet Take 50 mg by mouth every 8 (eight) hours as needed for dizziness.     . Fluticasone-Umeclidin-Vilant (TRELEGY ELLIPTA) 100-62.5-25 MCG/INH AEPB Inhale 1 puff into the lungs daily. 60 each 5  . lidocaine-prilocaine (EMLA) cream Apply 1 application topically as needed. 30 g 0  . pantoprazole (PROTONIX) 40 MG tablet Take 40 mg by mouth 2 (two) times daily.    . prochlorperazine (COMPAZINE) 10 MG tablet TAKE ONE TABLET BY MOUTH EVERY 6 HOURS AS NEEDED FOR NAUSEA OR VOMITING 30 tablet 0   No current facility-administered medications for this visit.    Facility-Administered Medications Ordered in Other Visits  Medication Dose Route Frequency Provider Last Rate Last Dose  . sodium chloride flush (NS) 0.9 % injection 10 mL  10 mL Intracatheter PRN [name], MD      . sodium chloride flush (NS) 0.9 % injection 10 mL  10 mL Intracatheter PRN [name], MD   10 mL at 05/20/19 1232    SURGICAL HISTORY:  Past Surgical History:  Procedure Laterality Date  . COLONOSCOPY W/ POLYPECTOMY    .  IR IMAGING GUIDED PORT INSERTION  03/14/2019  . TONSILLECTOMY    . VASECTOMY    . VIDEO BRONCHOSCOPY WITH ENDOBRONCHIAL ULTRASOUND N/A 12/06/2018   Procedure: VIDEO BRONCHOSCOPY WITH ENDOBRONCHIAL ULTRASOUND;  Surgeon: Garner Nash, DO;  Location: [facility] OR;  Service: Thoracic;  Laterality: N/A;    REVIEW OF SYSTEMS:  A comprehensive review of systems was negative except for: Constitutional: positive for fatigue Ears, nose, mouth, throat, and face: positive for  earaches Respiratory: positive for dyspnea on exertion   PHYSICAL EXAMINATION: General appearance: alert, cooperative, fatigued and no distress Head: Normocephalic, without obvious abnormality, atraumatic Neck: no adenopathy, no JVD, supple, symmetrical, trachea midline and thyroid not enlarged, symmetric, no tenderness/mass/nodules Lymph nodes: Cervical, supraclavicular, and axillary nodes normal. Resp: clear to auscultation bilaterally Back: symmetric, no curvature. ROM normal. No CVA tenderness. Cardio: regular rate and rhythm, S1, S2 normal, no murmur, click, rub or gallop GI: soft, non-tender; bowel sounds normal; no masses,  no organomegaly Extremities: extremities normal, atraumatic, no cyanosis or edema  ECOG PERFORMANCE STATUS: 1 - Symptomatic but completely ambulatory  Blood pressure (!) 119/54, pulse 94, temperature 98.7 F (37.1 C), resp. rate 18, height 5\' 8"  (1.727 m), weight 165 lb 3.2 oz (74.9 kg), SpO2 93 %.  LABORATORY DATA: Lab Results  Component Value Date   WBC 5.7 08/26/2019   HGB 8.5 (L) 08/26/2019   HCT 25.9 (L) 08/26/2019   MCV 130.2 (H) 08/26/2019   PLT 230 08/26/2019      Chemistry      Component Value Date/Time   NA 141 08/26/2019 1136   K 4.1 08/26/2019 1136   CL 110 08/26/2019 1136   CO2 24 08/26/2019 1136   BUN 14 08/26/2019 1136   CREATININE 0.99 08/26/2019 1136      Component Value Date/Time   CALCIUM 8.3 (L) 08/26/2019 1136   ALKPHOS 98 08/26/2019 1136   AST 15 08/26/2019 1136   ALT 10 08/26/2019 1136   BILITOT 1.1 08/26/2019 1136       RADIOGRAPHIC STUDIES: No results found.  ASSESSMENT AND PLAN: This is a very pleasant 78 years old white male diagnosed with stage IIIb non-small cell lung cancer, adenocarcinoma with no actionable mutations  Completed the course of concurrent chemoradiation with weekly carboplatin and paclitaxel status post 5 cycles.  He has partial response to this treatment. The patient is currently undergoing  consolidation treatment with immunotherapy with Imfinzi status post 11 cycles.   The patient has been tolerating this treatment well with no concerning adverse effects. I recommended for him to proceed with cycle #12 today as planned. I will see him back for follow-up visit in 2 weeks for evaluation after repeating CT scan of the chest for restaging of his disease. He was advised to call immediately if he has any concerning symptoms in the interval. The patient voices understanding of current disease status and treatment options and is in agreement with the current care plan.  All questions were answered. The patient knows to call the clinic with any problems, questions or concerns. We can certainly see the patient much sooner if necessary.  Disclaimer: This note was dictated with voice recognition software. Similar sounding words can inadvertently be transcribed and may not be corrected upon review.

## 2019-08-26 NOTE — Patient Instructions (Signed)
[facility] Discharge Instructions for Patients Receiving Chemotherapy  Today you received the following chemotherapy agents: Imfinzi.  To help prevent nausea and vomiting after your treatment, we encourage you to take your nausea medication as directed.   If you develop nausea and vomiting that is not controlled by your nausea medication, call the clinic.   BELOW ARE SYMPTOMS THAT SHOULD BE REPORTED IMMEDIATELY:  *FEVER GREATER THAN 100.5 F  *CHILLS WITH OR WITHOUT FEVER  NAUSEA AND VOMITING THAT IS NOT CONTROLLED WITH YOUR NAUSEA MEDICATION  *UNUSUAL SHORTNESS OF BREATH  *UNUSUAL BRUISING OR BLEEDING  TENDERNESS IN MOUTH AND THROAT WITH OR WITHOUT PRESENCE OF ULCERS  *URINARY PROBLEMS  *BOWEL PROBLEMS  UNUSUAL RASH Items with * indicate a potential emergency and should be followed up as soon as possible.  Feel free to call the clinic should you have any questions or concerns. The clinic phone number is [phone].  Please show the CHEMO ALERT CARD at check-in to the Emergency Department and triage nurse.   

## 2019-08-27 DIAGNOSIS — I10 Essential (primary) hypertension: Secondary | ICD-10-CM | POA: Diagnosis not present

## 2019-08-27 DIAGNOSIS — J449 Chronic obstructive pulmonary disease, unspecified: Secondary | ICD-10-CM | POA: Diagnosis not present

## 2019-08-27 DIAGNOSIS — J019 Acute sinusitis, unspecified: Secondary | ICD-10-CM | POA: Diagnosis not present

## 2019-08-27 DIAGNOSIS — C3490 Malignant neoplasm of unspecified part of unspecified bronchus or lung: Secondary | ICD-10-CM | POA: Diagnosis not present

## 2019-08-27 DIAGNOSIS — H9201 Otalgia, right ear: Secondary | ICD-10-CM | POA: Diagnosis not present

## 2019-08-29 DIAGNOSIS — J439 Emphysema, unspecified: Secondary | ICD-10-CM | POA: Diagnosis not present

## 2019-09-01 DIAGNOSIS — H524 Presbyopia: Secondary | ICD-10-CM | POA: Diagnosis not present

## 2019-09-01 DIAGNOSIS — H02889 Meibomian gland dysfunction of unspecified eye, unspecified eyelid: Secondary | ICD-10-CM | POA: Diagnosis not present

## 2019-09-01 DIAGNOSIS — H5203 Hypermetropia, bilateral: Secondary | ICD-10-CM | POA: Diagnosis not present

## 2019-09-01 DIAGNOSIS — H2513 Age-related nuclear cataract, bilateral: Secondary | ICD-10-CM | POA: Diagnosis not present

## 2019-09-08 ENCOUNTER — Other Ambulatory Visit: Payer: Self-pay

## 2019-09-08 ENCOUNTER — Encounter (HOSPITAL_COMMUNITY): Payer: Self-pay

## 2019-09-08 ENCOUNTER — Ambulatory Visit (HOSPITAL_COMMUNITY)
Admission: RE | Admit: 2019-09-08 | Discharge: 2019-09-08 | Disposition: A | Discharge status: Home / Self Care | Payer: Medicare HMO | Source: Ambulatory Visit | Attending: Internal Medicine | Admitting: Internal Medicine

## 2019-09-08 DIAGNOSIS — C3490 Malignant neoplasm of unspecified part of unspecified bronchus or lung: Secondary | ICD-10-CM | POA: Insufficient documentation

## 2019-09-08 DIAGNOSIS — Z5111 Encounter for antineoplastic chemotherapy: Secondary | ICD-10-CM | POA: Diagnosis not present

## 2019-09-08 MED ORDER — IOHEXOL 300 MG/ML  SOLN
75.0000 mL | Freq: Once | INTRAMUSCULAR | Status: AC | PRN
  Administered 2019-09-08: 75 mL via INTRAVENOUS

## 2019-09-08 MED ORDER — SODIUM CHLORIDE (PF) 0.9 % IJ SOLN
INTRAMUSCULAR | Status: AC
  Filled 2019-09-08: qty 50

## 2019-09-09 ENCOUNTER — Inpatient Hospital Stay (HOSPITAL_BASED_OUTPATIENT_CLINIC_OR_DEPARTMENT_OTHER): Payer: Medicare HMO | Admitting: Physician Assistant

## 2019-09-09 ENCOUNTER — Inpatient Hospital Stay: Payer: Medicare HMO

## 2019-09-09 ENCOUNTER — Telehealth: Payer: Self-pay | Admitting: *Deleted

## 2019-09-09 ENCOUNTER — Inpatient Hospital Stay: Payer: Medicare HMO | Attending: Internal Medicine

## 2019-09-09 ENCOUNTER — Other Ambulatory Visit: Payer: Self-pay

## 2019-09-09 ENCOUNTER — Encounter: Payer: Self-pay | Admitting: Physician Assistant

## 2019-09-09 VITALS — BP 110/57 | HR 88 | Temp 98.5°F | Resp 20 | Ht 68.0 in | Wt 166.6 lb

## 2019-09-09 DIAGNOSIS — Z5112 Encounter for antineoplastic immunotherapy: Secondary | ICD-10-CM

## 2019-09-09 DIAGNOSIS — R011 Cardiac murmur, unspecified: Secondary | ICD-10-CM | POA: Insufficient documentation

## 2019-09-09 DIAGNOSIS — K219 Gastro-esophageal reflux disease without esophagitis: Secondary | ICD-10-CM | POA: Diagnosis not present

## 2019-09-09 DIAGNOSIS — I10 Essential (primary) hypertension: Secondary | ICD-10-CM | POA: Insufficient documentation

## 2019-09-09 DIAGNOSIS — J449 Chronic obstructive pulmonary disease, unspecified: Secondary | ICD-10-CM | POA: Diagnosis not present

## 2019-09-09 DIAGNOSIS — M549 Dorsalgia, unspecified: Secondary | ICD-10-CM | POA: Diagnosis not present

## 2019-09-09 DIAGNOSIS — C3492 Malignant neoplasm of unspecified part of left bronchus or lung: Secondary | ICD-10-CM

## 2019-09-09 DIAGNOSIS — Z923 Personal history of irradiation: Secondary | ICD-10-CM | POA: Diagnosis not present

## 2019-09-09 DIAGNOSIS — R5383 Other fatigue: Secondary | ICD-10-CM | POA: Insufficient documentation

## 2019-09-09 DIAGNOSIS — Z9221 Personal history of antineoplastic chemotherapy: Secondary | ICD-10-CM | POA: Diagnosis not present

## 2019-09-09 DIAGNOSIS — C3432 Malignant neoplasm of lower lobe, left bronchus or lung: Secondary | ICD-10-CM | POA: Diagnosis not present

## 2019-09-09 DIAGNOSIS — Z79899 Other long term (current) drug therapy: Secondary | ICD-10-CM | POA: Diagnosis not present

## 2019-09-09 DIAGNOSIS — Z95828 Presence of other vascular implants and grafts: Secondary | ICD-10-CM

## 2019-09-09 LAB — CBC WITH DIFFERENTIAL (CANCER CENTER ONLY)
Abs Immature Granulocytes: 0.04 10*3/uL (ref 0.00–0.07)
Basophils Absolute: 0.1 10*3/uL (ref 0.0–0.1)
Basophils Relative: 2 %
Eosinophils Absolute: 0.1 10*3/uL (ref 0.0–0.5)
Eosinophils Relative: 2 %
HCT: 26.4 % — ABNORMAL LOW (ref 39.0–52.0)
Hemoglobin: 8.6 g/dL — ABNORMAL LOW (ref 13.0–17.0)
Immature Granulocytes: 1 %
Lymphocytes Relative: 10 %
Lymphs Abs: 0.5 10*3/uL — ABNORMAL LOW (ref 0.7–4.0)
MCH: 42 pg — ABNORMAL HIGH (ref 26.0–34.0)
MCHC: 32.6 g/dL (ref 30.0–36.0)
MCV: 128.8 fL — ABNORMAL HIGH (ref 80.0–100.0)
Monocytes Absolute: 0.5 10*3/uL (ref 0.1–1.0)
Monocytes Relative: 9 %
Neutro Abs: 4.1 10*3/uL (ref 1.7–7.7)
Neutrophils Relative %: 76 %
Platelet Count: 223 10*3/uL (ref 150–400)
RBC: 2.05 MIL/uL — ABNORMAL LOW (ref 4.22–5.81)
RDW: 15.9 % — ABNORMAL HIGH (ref 11.5–15.5)
WBC Count: 5.3 10*3/uL (ref 4.0–10.5)
nRBC: 0 % (ref 0.0–0.2)

## 2019-09-09 LAB — CMP (CANCER CENTER ONLY)
ALT: 13 U/L (ref 0–44)
AST: 16 U/L (ref 15–41)
Albumin: 3.6 g/dL (ref 3.5–5.0)
Alkaline Phosphatase: 84 U/L (ref 38–126)
Anion gap: 9 (ref 5–15)
BUN: 14 mg/dL (ref 8–23)
CO2: 22 mmol/L (ref 22–32)
Calcium: 8.4 mg/dL — ABNORMAL LOW (ref 8.9–10.3)
Chloride: 111 mmol/L (ref 98–111)
Creatinine: 0.95 mg/dL (ref 0.61–1.24)
GFR, Est AFR Am: >60 mL/min (ref >60)
GFR, Estimated: >60 mL/min (ref >60)
Glucose, Bld: 130 mg/dL — ABNORMAL HIGH (ref 70–99)
Potassium: 3.9 mmol/L (ref 3.5–5.1)
Sodium: 142 mmol/L (ref 135–145)
Total Bilirubin: 0.9 mg/dL (ref 0.3–1.2)
Total Protein: 5.9 g/dL — ABNORMAL LOW (ref 6.5–8.1)

## 2019-09-09 LAB — TSH: TSH: 1.693 u[IU]/mL (ref 0.320–4.118)

## 2019-09-09 MED ORDER — SODIUM CHLORIDE 0.9% FLUSH
10.0000 mL | INTRAVENOUS | Status: DC | PRN
  Administered 2019-09-09: 10 mL
  Filled 2019-09-09: qty 10

## 2019-09-09 MED ORDER — SODIUM CHLORIDE 0.9 % IV SOLN
9.6000 mg/kg | Freq: Once | INTRAVENOUS | Status: AC
  Administered 2019-09-09: 740 mg via INTRAVENOUS
  Filled 2019-09-09: qty 10

## 2019-09-09 MED ORDER — SODIUM CHLORIDE 0.9 % IV SOLN
Freq: Once | INTRAVENOUS | Status: DC
  Filled 2019-09-09: qty 250

## 2019-09-09 MED ORDER — HEPARIN SOD (PORK) LOCK FLUSH 100 UNIT/ML IV SOLN
500.0000 [IU] | Freq: Once | INTRAVENOUS | Status: AC | PRN
  Administered 2019-09-09: 500 [IU]
  Filled 2019-09-09: qty 5

## 2019-09-09 NOTE — Patient Instructions (Signed)
[facility] Discharge Instructions for Patients Receiving Chemotherapy  Today you received the following chemotherapy agents: Imfinzi.  To help prevent nausea and vomiting after your treatment, we encourage you to take your nausea medication as directed.   If you develop nausea and vomiting that is not controlled by your nausea medication, call the clinic.   BELOW ARE SYMPTOMS THAT SHOULD BE REPORTED IMMEDIATELY:  *FEVER GREATER THAN 100.5 F  *CHILLS WITH OR WITHOUT FEVER  NAUSEA AND VOMITING THAT IS NOT CONTROLLED WITH YOUR NAUSEA MEDICATION  *UNUSUAL SHORTNESS OF BREATH  *UNUSUAL BRUISING OR BLEEDING  TENDERNESS IN MOUTH AND THROAT WITH OR WITHOUT PRESENCE OF ULCERS  *URINARY PROBLEMS  *BOWEL PROBLEMS  UNUSUAL RASH Items with * indicate a potential emergency and should be followed up as soon as possible.  Feel free to call the clinic should you have any questions or concerns. The clinic phone number is [phone].  Please show the CHEMO ALERT CARD at check-in to the Emergency Department and triage nurse.   

## 2019-09-09 NOTE — Telephone Encounter (Signed)
Robert Dixon, relayed message per [facility], [facility].  During exam she noticed new heart murmur and wanted PCP Dr. Marisue Humble to be aware for appropriate cardiac workup could occur.

## 2019-09-09 NOTE — Progress Notes (Signed)
[name] OFFICE PROGRESS NOTE  [name], MD [address] [place] [address]  DIAGNOSIS: Stage IIIB(T1b, N3, M0) non-small cell lung cancer favoring adenocarcinoma diagnosed in January 2020 and presented with left lower lobe pulmonary nodule in addition to bilateral hilar and subcarinal lymphadenopathy  PRIOR THERAPY: Concurrent chemoradiation with weekly carboplatin for an AUC of 2 and paclitaxel 45 mg/m2. Status post 5 cycles.  CURRENT THERAPY: Consolidation immunotherapy with Imfinzi 10 mg/kg IV every 2 weeks. First dose March 25, 2019.Status post12 cycles.  INTERVAL HISTORY: Robert Dixon 78 y.o. male returns to the clinic for a follow-up visit.  The patient is feeling well today without any concerning complaints except for continued worsening shortness of breath with exertion over the last few months.  The patient is followed closely by pulmonology with [name] for this concern. The patient has an appointment with him next week.  He denies any fever, chills, or night sweats.  He denies any chest pain or hemoptysis.  He endorses his baseline cough.  The patient is on 2 L of home oxygen for his emphysema.  The patient also expresses a decreased desire to eat. He cooks his own food. He states that it is hard to describe his " stomach issues", but he denies any abdominal pain, nausea, vomiting, diarrhea, or constipation.  He has not lost any weight since his last appointment.  Otherwise, he has been tolerating his treatment with immunotherapy with Imfinzi well without any adverse side effects.  He denied denies any rashes or skin changes.  He denies any headache or visual changes.  The patient recently had a restaging CT scan performed.  He is here today for evaluation and to review his scan results before starting cycle #13.  MEDICAL HISTORY: Past Medical History:  Diagnosis Date  . Cancer ([place])    skin-   . Complication of anesthesia   . COPD  (chronic obstructive pulmonary disease) ([place])   . Dyspnea   . GERD (gastroesophageal reflux disease)   . Hemoptysis 11/20/2018  . History of hiatal hernia   . Hypertension   . Hypoxia 11/20/2018  . 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 Migraine with visual aura   . Non-small cell lung cancer ([place]) dx'd 11/20/18  . Pneumonia 11/20/2018  . PONV (postoperative nausea and vomiting)   . Pulmonary nodule 11/20/2018    ALLERGIES:  has No Known Allergies.  MEDICATIONS:  Current Outpatient Medications  Medication Sig Dispense Refill  . albuterol (PROVENTIL HFA;VENTOLIN HFA) 108 (90 Base) MCG/ACT inhaler Inhale 2 puffs into the lungs every 6 (six) hours as needed for wheezing or shortness of breath. 1 Inhaler 6  . benazepril (LOTENSIN) 10 MG tablet Take 10 mg by mouth daily.    . Cyanocobalamin (VITAMIN B 12 PO) Take 1 capsule by mouth daily. 2500 mcg    . dimenhyDRINATE (DRAMAMINE) 50 MG tablet Take 50 mg by mouth every 8 (eight) hours as needed for dizziness.     . Fluticasone-Umeclidin-Vilant (TRELEGY ELLIPTA) 100-62.5-25 MCG/INH AEPB Inhale 1 puff into the lungs daily. 60 each 5  . lidocaine-prilocaine (EMLA) cream Apply 1 application topically as needed. 30 g 0  . Multiple Vitamin (MULTIVITAMIN) tablet Take 1 tablet by mouth daily.    . pantoprazole (PROTONIX) 40 MG tablet Take 40 mg by mouth 2 (two) times daily.    . polycarbophil (FIBERCON) 625 MG tablet Take 625 mg by mouth daily.    . prochlorperazine (COMPAZINE) 10  MG tablet TAKE ONE TABLET BY MOUTH EVERY 6 HOURS AS NEEDED FOR NAUSEA OR VOMITING 30 tablet 0   No current facility-administered medications for this visit.    Facility-Administered Medications Ordered in Other Visits  Medication Dose Route Frequency Provider Last Rate Last Dose  . 0.9 %  sodium chloride infusion   Intravenous Once [name], MD      . durvalumab [facility]) 740 mg in sodium chloride 0.9 % 100 mL chemo infusion  9.6 mg/kg (Treatment Plan Recorded)  Intravenous Once [name], MD 115 mL/hr at 09/09/19 1151 740 mg at 09/09/19 1151  . heparin lock flush 100 unit/mL  500 Units Intracatheter Once PRN [name], MD      . sodium chloride flush (NS) 0.9 % injection 10 mL  10 mL Intracatheter PRN [name], MD      . sodium chloride flush (NS) 0.9 % injection 10 mL  10 mL Intracatheter PRN [name], MD   10 mL at 05/20/19 1232  . sodium chloride flush (NS) 0.9 % injection 10 mL  10 mL Intracatheter PRN [name], MD        SURGICAL HISTORY:  Past Surgical History:  Procedure Laterality Date  . COLONOSCOPY W/ POLYPECTOMY    . IR IMAGING GUIDED PORT INSERTION  03/14/2019  . TONSILLECTOMY    . VASECTOMY    . VIDEO BRONCHOSCOPY WITH ENDOBRONCHIAL ULTRASOUND N/A 12/06/2018   Procedure: VIDEO BRONCHOSCOPY WITH ENDOBRONCHIAL ULTRASOUND;  Surgeon: Garner Nash, DO;  Location: [facility] OR;  Service: Thoracic;  Laterality: N/A;    REVIEW OF SYSTEMS:   Review of Systems  Constitutional: Positive for fatigue and appetite changes. Negative for chills, fever and unexpected weight change.  HENT: Negative for mouth sores, nosebleeds, sore throat and trouble swallowing.   Eyes: Negative for eye problems and icterus.  Respiratory: Positive for shortness of breath with exertion and baseline cough. Negative for cough and wheezing.   Cardiovascular: Negative for chest pain and leg swelling.  Gastrointestinal: Negative for abdominal pain, constipation, diarrhea, nausea and vomiting.  Genitourinary: Negative for bladder incontinence, difficulty urinating, dysuria, frequency and hematuria.   Musculoskeletal: Negative for back pain, gait problem, neck pain and neck stiffness.  Skin: Negative for itching and rash.  Neurological: Negative for dizziness, extremity weakness, gait problem, headaches, light-headedness and seizures.  Hematological: Negative for adenopathy. Does not bruise/bleed easily.  Psychiatric/Behavioral: Negative  for confusion, depression and sleep disturbance. The patient is not nervous/anxious.     PHYSICAL EXAMINATION:  Blood pressure (!) 110/57, pulse 88, temperature 98.5 F (36.9 C), resp. rate 20, height 5\' 8"  (1.727 m), weight 166 lb 9.6 oz (75.6 kg), SpO2 95 %.  ECOG PERFORMANCE STATUS: 1 - Symptomatic but completely ambulatory  Physical Exam  Constitutional: Oriented to person, place, and time and elderly appearing male and in no distress.  HENT:  Head: Normocephalic and atraumatic.  Mouth/Throat: Oropharynx is clear and moist. No oropharyngeal exudate.  Eyes: Conjunctivae are normal. Right eye exhibits no discharge. Left eye exhibits no discharge. No scleral icterus.  Neck: Normal range of motion. Neck supple.  Cardiovascular: Grade III systolic ejection murmur. Normal rate. Intact distal pulses.   Pulmonary/Chest: Effort normal and breath sounds normal. No respiratory distress. No wheezes. No rales.  Abdominal: Soft. Bowel sounds are normal. Exhibits no distension and no mass. There is no tenderness.  Musculoskeletal: Normal range of motion. Exhibits no edema.  Lymphadenopathy:    No cervical adenopathy.  Neurological: Alert and oriented to person,  place, and time. Exhibits normal muscle tone. Gait normal. Coordination normal.  Skin: Skin is warm and dry. No rash noted. Not diaphoretic. No erythema. No pallor.  Psychiatric: Mood, memory and judgment normal.  Vitals reviewed.  LABORATORY DATA: Lab Results  Component Value Date   WBC 5.3 09/09/2019   HGB 8.6 (L) 09/09/2019   HCT 26.4 (L) 09/09/2019   MCV 128.8 (H) 09/09/2019   PLT 223 09/09/2019      Chemistry      Component Value Date/Time   NA 142 09/09/2019 0939   K 3.9 09/09/2019 0939   CL 111 09/09/2019 0939   CO2 22 09/09/2019 0939   BUN 14 09/09/2019 0939   CREATININE 0.95 09/09/2019 0939      Component Value Date/Time   CALCIUM 8.4 (L) 09/09/2019 0939   ALKPHOS 84 09/09/2019 0939   AST 16 09/09/2019 0939    ALT 13 09/09/2019 0939   BILITOT 0.9 09/09/2019 0939       RADIOGRAPHIC STUDIES:  Ct Chest W Contrast  Result Date: 09/08/2019 CLINICAL DATA:  Lung cancer. Radiation therapy and chemotherapy complete. Increasing shortness of breath. EXAM: CT CHEST WITH CONTRAST TECHNIQUE: Multidetector CT imaging of the chest was performed during intravenous contrast administration. CONTRAST:  89mL OMNIPAQUE IOHEXOL 300 MG/ML  SOLN COMPARISON:  05/23/2019. FINDINGS: Cardiovascular: Right IJ Port-A-Cath terminates in the low SVC. Atherosclerotic calcification of the aorta, aortic valve and coronary arteries. Heart is at the upper limits of normal in size to mildly enlarged. New small pericardial effusion. Mediastinum/Nodes: Mediastinal lymph nodes are not enlarged by CT size criteria. 1.3 cm subcarinal lymph node, stable. No hilar or axillary adenopathy. Esophagus is unremarkable. Lungs/Pleura: Centrilobular emphysema. Image quality is degraded by respiratory motion. Peribronchovascular consolidation in the anterior segment right upper lobe is new, indicative of an infectious or inflammatory etiology. Patchy consolidation, bronchiectasis and parenchymal retraction in the lingula and left lower lobe appear slightly more organized than on the prior exam. Moderate, partially loculated left pleural effusion, increased. Small right pleural effusion, new. Upper Abdomen: Subcentimeter low-attenuation lesions in the liver are too small to characterize but unchanged. Visualized portions of the liver and adrenal glands are otherwise unremarkable. Low and high attenuation lesions in the kidneys measure up to 2.6 cm on the left and are difficult to further characterize due to size and/or lack of precontrast imaging. Visualized portions of the spleen, pancreas, stomach and bowel are unremarkable. Musculoskeletal: No worrisome lytic or sclerotic lesions. Mild compression of the T12 vertebral body is unchanged. IMPRESSION: 1.  Evolutionary changes of radiation therapy in the left hemithorax. No evidence of recurrent or metastatic disease. 2. New small pericardial effusion. 3. Moderate loculated left pleural effusion, increased. Small right pleural effusion, new. 4. Aortic atherosclerosis (ICD10-170.0). Coronary artery calcification. 5.  Emphysema (ICD10-J43.9). Electronically Signed   By: Lorin Picket M.D.   On: 09/08/2019 09:23     ASSESSMENT/PLAN:  This is a very pleasant 78 year old Caucasian male diagnosed with stage IIIb non-small cell lung cancer, adenocarcinoma of the left lower lobe. He was diagnosed in January 2020. He has no actionable mutations.  The patient underwent concurrent chemoradiation with carboplatin for an AUC of 2 and paclitaxel 45 mg/m. He is status post 5 cycles. He was unable to proceed with cycle #6 secondary to thrombocytopenia.   The patient is currently undergoing consolidation immunotherapy with Imfinzi 10 mg/kg IV every 2 weeks. Status post 12cycles.He has been tolerating treatment well without any adverse effects.   The patient recently  had a restaging CT scan performed.  Dr. Julien Nordmann personally and independently reviewed the scan and discussed the results with the patient today.  The scan did not show any evidence for  disease progression.  Dr. Julien Nordmann recommends that the patient continue on the same treatment.  The patient will receive cycle #13 of Imfinzi today as scheduled.  Regarding the patient's shortness of breath, we encouraged the patient to keep his appointment next week with pulmonology.  The scan did not show any new findings to explain his shortness of breath except for his underlying emphysema as well as scarring from his prior radiation treatment.  He also has a loculated pleural effusion.  We will see the patient back for follow-up visit in 2 weeks for evaluation before starting cycle #14.  Regarding the patient's systolic ejection murmur, the patient is  unsure if his murmur has been evaluated before but believes that his primary physician is aware of it. I cannot see any records in our EMR regarding evaluation regarding this condition. The murmur was new to me as of 07/15/2019. We will reach out to the patient's primary physician regarding this to see if they feel that further evaluation is warranted or if this has been worked up by their office in the past, as reported by the patient. The patient believes he has seen a cardiologist by the name of Dr. Tamala Julian in the past but it is unclear when that was or what was found at that time.   The patient was advised to call immediately if he has any concerning symptoms in the interval. The patient voices understanding of current disease status and treatment options and is in agreement with the current care plan. All questions were answered. The patient knows to call the clinic with any problems, questions or concerns. We can certainly see the patient much sooner if necessary  No orders of the defined types were placed in this encounter.    Domenico Achord L Bear Osten, PA-C 09/09/19  ADDENDUM: Hematology/Oncology Attending: I had a face-to-face encounter with the patient today.  I recommended his care plan.  This is a very pleasant 78 years old white male with stage IIIb non-small cell lung cancer, adenocarcinoma status post concurrent chemoradiation and he is currently undergoing consolidation treatment with immunotherapy with Imfinzi status post 12 cycles.  The patient has been tolerating this treatment well with no concerning adverse effects. He continues to have shortness of breath at baseline increased with exertion and he is currently on home oxygen. He had repeat CT scan of the chest performed recently.  I personally and independently reviewed the scans and discussed the results with the patient today. Has a scan showed no concerning findings for disease progression. I recommended for the patient to  continue his current treatment with Imfinzi and he will proceed with cycle #13 today. Regarding the shortness of breath, he is followed by [name] from pulmonary medicine and he is a scheduled to see him next week. The patient also has persistent murmur and he will be referred to his primary care physician for evaluation and referral to cardiology if needed. He will come back for follow-up visit in 2 weeks for evaluation before the next cycle of his treatment. He was advised to call immediately if he has any concerning symptoms in the interval.  Disclaimer: This note was dictated with voice recognition software. Similar sounding words can inadvertently be transcribed and may be missed upon review. Eilleen Kempf, MD 09/09/19

## 2019-09-15 ENCOUNTER — Other Ambulatory Visit (HOSPITAL_COMMUNITY): Payer: Self-pay | Admitting: Family Medicine

## 2019-09-15 DIAGNOSIS — R011 Cardiac murmur, unspecified: Secondary | ICD-10-CM

## 2019-09-16 ENCOUNTER — Telehealth: Payer: Self-pay | Admitting: Pulmonary Disease

## 2019-09-16 ENCOUNTER — Other Ambulatory Visit: Payer: Self-pay

## 2019-09-16 ENCOUNTER — Encounter: Payer: Self-pay | Admitting: Adult Health

## 2019-09-16 ENCOUNTER — Ambulatory Visit (INDEPENDENT_AMBULATORY_CARE_PROVIDER_SITE_OTHER): Payer: Medicare HMO | Admitting: Adult Health

## 2019-09-16 DIAGNOSIS — J90 Pleural effusion, not elsewhere classified: Secondary | ICD-10-CM

## 2019-09-16 DIAGNOSIS — J9611 Chronic respiratory failure with hypoxia: Secondary | ICD-10-CM | POA: Diagnosis not present

## 2019-09-16 DIAGNOSIS — C3412 Malignant neoplasm of upper lobe, left bronchus or lung: Secondary | ICD-10-CM | POA: Diagnosis not present

## 2019-09-16 DIAGNOSIS — J432 Centrilobular emphysema: Secondary | ICD-10-CM

## 2019-09-16 MED ORDER — AZITHROMYCIN 250 MG PO TABS: ORAL_TABLET | ORAL | 0 refills | Status: AC

## 2019-09-16 NOTE — Assessment & Plan Note (Signed)
Bilateral pleural effusions left greater than right.  Most recent CT chest shows slightly increased size of left pleural effusion partially loculated.  2D echo is pending may have a component of heart failure. Will treat for possible PNA -right sided.  Follow echo results , may need diuresis is looks like volume overload.  We will repeat chest x-ray on return.  We will look to see if thoracentesis may be helpful.

## 2019-09-16 NOTE — Progress Notes (Signed)
PCCM: Thanks for calling him Garner Nash, DO [facility] 09/16/2019 5:37 PM

## 2019-09-16 NOTE — Assessment & Plan Note (Signed)
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 Patient has taken Trelegy.  Has had no significant perceived benefit.  For now will continue and reevaluate on return.  May also need to discontinue Trelegy and switch to inhaler without anticholinergic.  As patient is complaining of increased urinary symptoms (urinary urgency and stream issues) Recent CT chest shows new area in right upper lobe questionable possible infectious source along with a small right pleural effusion we will treat as a possible underlying infection/pneumonia.   Plan  Patient Instructions  Begin Zpack take as directed.  Mucinex DM Twice daily As needed  Cough/congestion  Continue on Oxygen 2l/m with activity .  Continue on Oxygen At bedtime.  Order for POC .  High protein diet .  Continue on TRELEGY , rinse after use.  Follow up with for Echo as planned   Discuss with Primary MD regarding compression fracture.  Follow in 2 weeks with [name] or Tonyia Marschall NP  with chest xray and As needed   Please contact office for sooner follow up if symptoms do not improve or worsen or seek emergency care

## 2019-09-16 NOTE — Progress Notes (Signed)
Virtual Visit via Telephone Note  I connected with Robert Dixon on 09/16/19 at  2:30 PM EDT by telephone and verified that I am speaking with the correct person using two identifiers.  Location: Patient: Home  Provider: Office    I discussed the limitations, risks, security and privacy concerns of performing an evaluation and management service by telephone and the availability of in person appointments. I also discussed with the patient that there may be a patient responsible charge related to this service. The patient expressed understanding and agreed to proceed.   History of Present Illness: 78 year old male former smoker seen for pulmonary consult January 2020 for a left hilar mass.  CT chest December 2019 showed a 4.4 cm left hilar mass with narrowing of the left upper lobe and left lower lobe bronchi along with positive lymphadenopathy.  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  Patient was seen by oncology and started on palonosetron, carboplatin and Taxol.  He underwent radiation therapy.    He is currently on immunotherapy with Imfinzi .  MRI was negative for metastatic disease.  Today's televisit is for an acute office office for dyspnea .  Complains over the last 2 weeks has been having increased shortness of breath, back discomfort, some cough and sinus drainage. Has been very weak for last several months since diagnosed with lung cancer and treatment /radiation.  No fever, or discolored mucus. Recent follow up with Oncology with serial CT chest .  CT chest September 08, 2019 showed evolutionary changes of radiation therapy in the left hemothorax no evidence of recurrent or metastatic disease.  New consolidation in right upper lobe.  Increased moderate partially loculated left pleural effusion and small right pleural effusion. Says he has ongoing issues with anorexia and  lack of energy.  Says since starting TRELEGY feels urinary issues are getting worse with more urgency.  Has not seen anyone for compression fracture in back.  Also has echo ordered currently.  No orthopnea or leg swelling .  Dyspnea is worse with activity , not bad at rest.   1 year prior to dx of lung cancer was doing regular activities at home, able to drive, do light yard work without significant issues. Did not carry dx of COPD prior to this. Now gets dyspnea with minimal activity. No sign dyspnea at rest.   Observations/Objective: 02/28/2019 CT Chest Severe Emphysema No new or progressive disease within the thorax. Near complete resolution of left hilar and mediastinal lymphadenopathy. Interval resolution of left lower lobe pulmonary nodule since previous study  PET Scan 4/40/3474 Hypermetabolic mediastinal and left hilar adenopathy. Differential considerations include small-cell lung cancer or a lymphoproliferative process such as lymphoma. Reticulonodular lingular and central left lower lobe opacities are likely related to postobstructive pneumonitis from left hilar adenopathy. Hypermetabolic subpleural left lower lobe pulmonary nodule which could represent a primary bronchogenic carcinoma (i.e. Small-cell lung cancer can present in this way) or also be related to postobstructive pneumonitis. No hypermetabolic extrathoracic metastasis. Aortic atherosclerosis (ICD10-I70.0), coronary artery atherosclerosis and emphysema (ICD10-J43.9). Indeterminate left renal lesions which could represent complex cysts or solid neoplasms. Consider nonemergent pre and post contrast abdominal MRI (preferred) or CT. Low-level hypermetabolism within both adrenal glands, without well-defined dominant mass. Favored to be physiologic. Recommend attention on follow-up.  12/13/2018 MR Brain>> evidence of metastatic disease.  Atrophy and chronic small-vessel ischemic  changes.  12/06/2018: BRONCHIAL BRUSHING (  B) LEFT UPPER LOBE BRUSHING (SPECIMEN 3 OF 3, COLLECTED ON 12/06/18): MALIGNANT CELLS CONSISTENT WITH NON SMALL CELL CARCINOMA 12/06/2018 FINE NEEDLE ASPIRATION, ENDOSCOPIC (A) 7 NODE (SPECIMEN 1 OF 3 COLLECTED 12/06/2018) MALIGNANT CELLS CONSISTENT WITH NON-SMALL CELL CARCINOMA.  adenocarcinoma with no actionable mutations and currently undergoing a  CT chest 11/20/2018: Left hilar mass with associated adenopathy endobronchial narrowing, subcarinal and bilateral hilar adenopathy. Ill-defined small density within the liver possible focus of metastatic disease.  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 Patient has taken Trelegy.  Has had no significant perceived benefit.  For now will continue and reevaluate on return.  May also need to discontinue Trelegy and switch to inhaler without anticholinergic.  As patient is complaining of increased urinary symptoms (urinary urgency and stream issues) Recent CT chest shows new area in right upper lobe questionable possible infectious source along with a small right pleural effusion we will treat as a possible underlying infection/pneumonia.    Bilateral Pleural Effusions L > Right - Bilateral pleural effusions left greater than right.  Most recent CT chest shows slightly increased size of left pleural effusion partially loculated.  2D echo is pending may have a component of heart failure. Will treat for possible PNA -right sided.  Follow echo results , may need diuresis is looks like volume overload.  We will repeat chest x-ray on return.  We will look to see if thoracentesis may be helpful.   O2 RF -O2 to keep sats >88-90%  Lung Cancer -keep follow up with Oncology and treatment plan   Plan  Patient Instructions  Begin Zpack take as directed.  Mucinex DM Twice daily As needed  Cough/congestion  Continue on Oxygen 2l/m with activity .  Continue  on Oxygen At bedtime.  Order for POC .  High protein diet .  Continue on TRELEGY , rinse after use.  Follow up with for Echo as planned   Discuss with Primary MD regarding compression fracture.  Follow in 2 weeks with [name] or Ledarrius Beauchaine NP  with chest xray and As needed   Please contact office for sooner follow up if symptoms do not improve or worsen or seek emergency care      Follow Up Instructions: Follow up in 2 weeks with [facility] or Kaleb Linquist with CXR  Please contact office for sooner follow up if symptoms do not improve or worsen or seek emergency care     I discussed the assessment and treatment plan with the patient. The patient was provided an opportunity to ask questions and all were answered. The patient agreed with the plan and demonstrated an understanding of the instructions.   The patient was advised to call back or seek an in-person evaluation if the symptoms worsen or if the condition fails to improve as anticipated.  I provided 35 minutes of non-face-to-face time during this encounter.   Rexene Edison, NP

## 2019-09-16 NOTE — Patient Instructions (Signed)
Begin Zpack take as directed.  Mucinex DM Twice daily As needed  Cough/congestion  Continue on Oxygen 2l/m with activity .  Continue on Oxygen At bedtime.  Order for POC .  High protein diet .  Continue on TRELEGY , rinse after use.  Follow up with for Echo as planned   Discuss with Primary MD regarding compression fracture.  Follow in 2 weeks with [name] or Suhan Paci NP  with chest xray and As needed   Please contact office for sooner follow up if symptoms do not improve or worsen or seek emergency care

## 2019-09-16 NOTE — Assessment & Plan Note (Signed)
Patient is to continue on oxygen therapy with 2 L.  Goal was to have O2 saturation greater than 88 to 90%.  Order for POC has been sent once again.  As he has not received his POC

## 2019-09-16 NOTE — Telephone Encounter (Signed)
Spoke with patient.  He knew mychart so scheduled him a mychart visit with TP per Dr. Fabio Bering recommendations.  Nothing further needed

## 2019-09-16 NOTE — Assessment & Plan Note (Signed)
Left-sided lung cancer status post chemo and radiation.  Continues on immunotherapy with oncology.  Most recent CT chest showed radiation changes with no evidence of recurrent or metastatic disease Continue follow-up with oncology

## 2019-09-16 NOTE — Telephone Encounter (Signed)
Called and spoke to patient. Patient stated that he has a runny nose and feels that is related to his nasal cannula for oxygen.  Patient also reports that he has increased shortness of breath with activity and sharp back pains at times.   Patient stated he can do a video visit but prefers to come in to the office so that his oxygen can be looked at.    [name] please advise if you want to have patient come in or changed to video visit.

## 2019-09-16 NOTE — Telephone Encounter (Signed)
PCCM:  Can we get him a tele visit today with an APP? He may need covid screened if he is having active symptoms or contacts etc.   If he must be seen in person may need to be seen here with sick visit and PPE.   Garner Nash, DO  Pulmonary Critical Care 09/16/2019 12:59 PM

## 2019-09-17 ENCOUNTER — Ambulatory Visit: Payer: Medicare HMO | Admitting: Pulmonary Disease

## 2019-09-17 ENCOUNTER — Telehealth: Payer: Self-pay | Admitting: Adult Health

## 2019-09-17 NOTE — Telephone Encounter (Signed)
LMOM TCB x2 (yesterday and today)  Patient had televisit yesterday with TP and asked about the POC that was ordered in May 2020 but he has not received.  Called [employer] customer service [phone] and spoke with Corene Cornea who reported that patient has been paying for a [employer] since 9.11.2020.  Verified with patient again yesterday after visit that he DOES NOT have a POC at home.  Have left a message twice for Melissa to discuss/investigate.

## 2019-09-19 DIAGNOSIS — M549 Dorsalgia, unspecified: Secondary | ICD-10-CM | POA: Diagnosis not present

## 2019-09-23 ENCOUNTER — Telehealth: Payer: Self-pay | Admitting: Physician Assistant

## 2019-09-23 ENCOUNTER — Inpatient Hospital Stay: Payer: Medicare HMO

## 2019-09-23 ENCOUNTER — Inpatient Hospital Stay (HOSPITAL_BASED_OUTPATIENT_CLINIC_OR_DEPARTMENT_OTHER): Payer: Medicare HMO | Admitting: Physician Assistant

## 2019-09-23 ENCOUNTER — Telehealth: Payer: Self-pay | Admitting: *Deleted

## 2019-09-23 ENCOUNTER — Other Ambulatory Visit: Payer: Self-pay

## 2019-09-23 VITALS — BP 111/51 | HR 90 | Temp 98.0°F | Resp 18 | Ht 68.0 in | Wt 160.5 lb

## 2019-09-23 DIAGNOSIS — C3432 Malignant neoplasm of lower lobe, left bronchus or lung: Secondary | ICD-10-CM | POA: Diagnosis not present

## 2019-09-23 DIAGNOSIS — J449 Chronic obstructive pulmonary disease, unspecified: Secondary | ICD-10-CM | POA: Diagnosis not present

## 2019-09-23 DIAGNOSIS — Z95828 Presence of other vascular implants and grafts: Secondary | ICD-10-CM

## 2019-09-23 DIAGNOSIS — Z5112 Encounter for antineoplastic immunotherapy: Secondary | ICD-10-CM

## 2019-09-23 DIAGNOSIS — Z9221 Personal history of antineoplastic chemotherapy: Secondary | ICD-10-CM | POA: Diagnosis not present

## 2019-09-23 DIAGNOSIS — C3492 Malignant neoplasm of unspecified part of left bronchus or lung: Secondary | ICD-10-CM

## 2019-09-23 DIAGNOSIS — R011 Cardiac murmur, unspecified: Secondary | ICD-10-CM | POA: Diagnosis not present

## 2019-09-23 DIAGNOSIS — M549 Dorsalgia, unspecified: Secondary | ICD-10-CM | POA: Diagnosis not present

## 2019-09-23 DIAGNOSIS — R3911 Hesitancy of micturition: Secondary | ICD-10-CM | POA: Insufficient documentation

## 2019-09-23 DIAGNOSIS — Z923 Personal history of irradiation: Secondary | ICD-10-CM | POA: Diagnosis not present

## 2019-09-23 DIAGNOSIS — I10 Essential (primary) hypertension: Secondary | ICD-10-CM | POA: Diagnosis not present

## 2019-09-23 DIAGNOSIS — R5383 Other fatigue: Secondary | ICD-10-CM | POA: Diagnosis not present

## 2019-09-23 LAB — CBC WITH DIFFERENTIAL (CANCER CENTER ONLY)
Abs Immature Granulocytes: 0.02 10*3/uL (ref 0.00–0.07)
Basophils Absolute: 0.1 10*3/uL (ref 0.0–0.1)
Basophils Relative: 2 %
Eosinophils Absolute: 0 10*3/uL (ref 0.0–0.5)
Eosinophils Relative: 1 %
HCT: 28 % — ABNORMAL LOW (ref 39.0–52.0)
Hemoglobin: 9.3 g/dL — ABNORMAL LOW (ref 13.0–17.0)
Immature Granulocytes: 0 %
Lymphocytes Relative: 10 %
Lymphs Abs: 0.5 10*3/uL — ABNORMAL LOW (ref 0.7–4.0)
MCH: 41.2 pg — ABNORMAL HIGH (ref 26.0–34.0)
MCHC: 33.2 g/dL (ref 30.0–36.0)
MCV: 123.9 fL — ABNORMAL HIGH (ref 80.0–100.0)
Monocytes Absolute: 0.5 10*3/uL (ref 0.1–1.0)
Monocytes Relative: 10 %
Neutro Abs: 3.9 10*3/uL (ref 1.7–7.7)
Neutrophils Relative %: 77 %
Platelet Count: 230 10*3/uL (ref 150–400)
RBC: 2.26 MIL/uL — ABNORMAL LOW (ref 4.22–5.81)
RDW: 14.6 % (ref 11.5–15.5)
WBC Count: 5.1 10*3/uL (ref 4.0–10.5)
nRBC: 0 % (ref 0.0–0.2)

## 2019-09-23 LAB — CMP (CANCER CENTER ONLY)
ALT: 10 U/L (ref 0–44)
AST: 14 U/L — ABNORMAL LOW (ref 15–41)
Albumin: 3.8 g/dL (ref 3.5–5.0)
Alkaline Phosphatase: 98 U/L (ref 38–126)
Anion gap: 8 (ref 5–15)
BUN: 13 mg/dL (ref 8–23)
CO2: 24 mmol/L (ref 22–32)
Calcium: 8.6 mg/dL — ABNORMAL LOW (ref 8.9–10.3)
Chloride: 108 mmol/L (ref 98–111)
Creatinine: 0.92 mg/dL (ref 0.61–1.24)
GFR, Est AFR Am: >60 mL/min (ref >60)
GFR, Estimated: >60 mL/min (ref >60)
Glucose, Bld: 124 mg/dL — ABNORMAL HIGH (ref 70–99)
Potassium: 4 mmol/L (ref 3.5–5.1)
Sodium: 140 mmol/L (ref 135–145)
Total Bilirubin: 1.3 mg/dL — ABNORMAL HIGH (ref 0.3–1.2)
Total Protein: 6.1 g/dL — ABNORMAL LOW (ref 6.5–8.1)

## 2019-09-23 LAB — URINALYSIS, COMPLETE (UACMP) WITH MICROSCOPIC
Bacteria, UA: NONE SEEN
Bilirubin Urine: NEGATIVE
Glucose, UA: NEGATIVE mg/dL
Hgb urine dipstick: NEGATIVE
Ketones, ur: NEGATIVE mg/dL
Leukocytes,Ua: NEGATIVE
Nitrite: NEGATIVE
Protein, ur: NEGATIVE mg/dL
Specific Gravity, Urine: 1.011 (ref 1.005–1.030)
pH: 5 (ref 5.0–8.0)

## 2019-09-23 MED ORDER — SODIUM CHLORIDE 0.9% FLUSH
10.0000 mL | INTRAVENOUS | Status: DC | PRN
  Administered 2019-09-23: 10 mL
  Filled 2019-09-23: qty 10

## 2019-09-23 MED ORDER — SODIUM CHLORIDE 0.9 % IV SOLN
Freq: Once | INTRAVENOUS | Status: AC
  Administered 2019-09-23: 11:00:00 via INTRAVENOUS
  Filled 2019-09-23: qty 250

## 2019-09-23 MED ORDER — HEPARIN SOD (PORK) LOCK FLUSH 100 UNIT/ML IV SOLN
500.0000 [IU] | Freq: Once | INTRAVENOUS | Status: AC | PRN
  Administered 2019-09-23: 500 [IU]
  Filled 2019-09-23: qty 5

## 2019-09-23 MED ORDER — SODIUM CHLORIDE 0.9 % IV SOLN
9.6000 mg/kg | Freq: Once | INTRAVENOUS | Status: AC
  Administered 2019-09-23: 740 mg via INTRAVENOUS
  Filled 2019-09-23: qty 10

## 2019-09-23 NOTE — Progress Notes (Signed)
Tbili = 1.3 today.  Noted by [employer], PA.  Ok to proceed w/ Imfinzi.   Will monitor closely. Kennith Center, Pharm.D., CPP 09/23/2019@11 :14 AM

## 2019-09-23 NOTE — Telephone Encounter (Signed)
Scheduled per los. Called and left msg. Mailed printout  °

## 2019-09-23 NOTE — Patient Instructions (Signed)
[facility] Discharge Instructions for Patients Receiving Chemotherapy  Today you received the following chemotherapy agents: Imfinzi.  To help prevent nausea and vomiting after your treatment, we encourage you to take your nausea medication as directed.   If you develop nausea and vomiting that is not controlled by your nausea medication, call the clinic.   BELOW ARE SYMPTOMS THAT SHOULD BE REPORTED IMMEDIATELY:  *FEVER GREATER THAN 100.5 F  *CHILLS WITH OR WITHOUT FEVER  NAUSEA AND VOMITING THAT IS NOT CONTROLLED WITH YOUR NAUSEA MEDICATION  *UNUSUAL SHORTNESS OF BREATH  *UNUSUAL BRUISING OR BLEEDING  TENDERNESS IN MOUTH AND THROAT WITH OR WITHOUT PRESENCE OF ULCERS  *URINARY PROBLEMS  *BOWEL PROBLEMS  UNUSUAL RASH Items with * indicate a potential emergency and should be followed up as soon as possible.  Feel free to call the clinic should you have any questions or concerns. The clinic phone number is [phone].  Please show the CHEMO ALERT CARD at check-in to the Emergency Department and triage nurse.   

## 2019-09-23 NOTE — Telephone Encounter (Signed)
Notified patient urine results were negative for urinary tract infection.

## 2019-09-23 NOTE — Progress Notes (Signed)
Robert Dixon OFFICE PROGRESS NOTE  Robert Arabian, MD [facility] [address] [place] [address]  DIAGNOSIS: Stage IIIB(T1b, N3, M0) non-small cell lung cancer favoring adenocarcinoma diagnosed in January 2020 and presented with left lower lobe pulmonary nodule in addition to bilateral hilar and subcarinal lymphadenopathy  PRIOR THERAPY: Concurrent chemoradiation with weekly carboplatin for an AUC of 2 and paclitaxel 45 mg/m2. Status post 5 cycles.  CURRENT THERAPY: Consolidation immunotherapy with Imfinzi 10 mg/kg IV every 2 weeks. First dose March 25, 2019.Status post13 cycles.  INTERVAL HISTORY: Robert Dixon 78 y.o. male returns to the clinic for a follow-up visit.  The patient is feeling fair today except he endorses some back pain inbetween his shoulder blades.  The patient's last couple CT scans, including his most recent scan from 09/08/2019, noted a mild compression of the T12 vertebral body.  The patient has been prescribed pain medication by his primary doctor; however, the patient is unsure the name of the medication. He takes it twice daily.   The patient recently saw his pulmonologist regarding his breathing and shortness of breath.  He was prescribed a Z-Pak and further recommendations with his inhalers were discussed. He is scheduled to follow-up with them in about 2 weeks for reevaluation and a repeat chest x-ray.  The patient is scheduled for an echocardiogram to further evaluate his heart murmur tomorrow.  Otherwise, the patient is tolerating his treatment fairly well with immunotherapy.  He denies any recent fever, chills, night sweats, or weight loss.  He denies any chest pain or hemoptysis but endorses baseline shortness of breath with exertion for which he is on 2 L of oxygen via nasal cannula.  He denies any nausea, vomiting, diarrhea, or constipation.  He does continue to report some uneasiness in his stomach but he is unable to elaborate on this  sensation as it is difficult to describe. He notes that his urine has had a "funny" smell to it for the last 1-2 weeks and urinary steam issues that is though to be related to the anticholinergic side effects of his inhaler. He denies any headache or visual changes.  He denies any rashes or skin changes.  He is here today for evaluation before starting cycle #14.   MEDICAL HISTORY: Past Medical History:  Diagnosis Date  . Cancer ([place])    skin-   . Complication of anesthesia   . COPD (chronic obstructive pulmonary disease) ([place])   . Dyspnea   . GERD (gastroesophageal reflux disease)   . Hemoptysis 11/20/2018  . History of hiatal hernia   . Hypertension   . Hypoxia 11/20/2018  . 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 Migraine with visual aura   . Non-small cell lung cancer ([name]) dx'd 11/20/18  . Pneumonia 11/20/2018  . PONV (postoperative nausea and vomiting)   . Pulmonary nodule 11/20/2018    ALLERGIES:  has No Known Allergies.  MEDICATIONS:  Current Outpatient Medications  Medication Sig Dispense Refill  . albuterol (PROVENTIL HFA;VENTOLIN HFA) 108 (90 Base) MCG/ACT inhaler Inhale 2 puffs into the lungs every 6 (six) hours as needed for wheezing or shortness of breath. 1 Inhaler 6  . benazepril (LOTENSIN) 10 MG tablet Take 10 mg by mouth daily.    . Cyanocobalamin (VITAMIN B 12 PO) Take 1 capsule by mouth daily. 2500 mcg    . dimenhyDRINATE (DRAMAMINE) 50 MG tablet Take 50 mg by mouth every 8 (eight) hours as needed for dizziness.     Marland Kitchen  Durvalumab (IMFINZI IV) Inject into the vein every 14 (fourteen) days.    Marland Kitchen lidocaine-prilocaine (EMLA) cream Apply 1 application topically as needed. 30 g 0  . Multiple Vitamin (MULTIVITAMIN) tablet Take 1 tablet by mouth daily.    . pantoprazole (PROTONIX) 40 MG tablet Take 40 mg by mouth 2 (two) times daily.    . polycarbophil (FIBERCON) 625 MG tablet Take 625 mg by mouth 2 (two) times daily.     . prochlorperazine (COMPAZINE) 10 MG tablet TAKE  ONE TABLET BY MOUTH EVERY 6 HOURS AS NEEDED FOR NAUSEA OR VOMITING 30 tablet 0   No current facility-administered medications for this visit.    Facility-Administered Medications Ordered in Other Visits  Medication Dose Route Frequency Provider Last Rate Last Dose  . sodium chloride flush (NS) 0.9 % injection 10 mL  10 mL Intracatheter PRN [name], MD      . sodium chloride flush (NS) 0.9 % injection 10 mL  10 mL Intracatheter PRN [name], MD   10 mL at 05/20/19 1232  . sodium chloride flush (NS) 0.9 % injection 10 mL  10 mL Intracatheter PRN [name], MD   10 mL at 09/23/19 1258    SURGICAL HISTORY:  Past Surgical History:  Procedure Laterality Date  . COLONOSCOPY W/ POLYPECTOMY    . IR IMAGING GUIDED PORT INSERTION  03/14/2019  . TONSILLECTOMY    . VASECTOMY    . VIDEO BRONCHOSCOPY WITH ENDOBRONCHIAL ULTRASOUND N/A 12/06/2018   Procedure: VIDEO BRONCHOSCOPY WITH ENDOBRONCHIAL ULTRASOUND;  Surgeon: Garner Nash, DO;  Location: [facility] OR;  Service: Thoracic;  Laterality: N/A;    REVIEW OF SYSTEMS:   Review of Systems  Constitutional: Positive for fatigue and appetite changes. Negative for chills, fever and unexpected weight change.  HENT: Negative for mouth sores, nosebleeds, sore throat and trouble swallowing.   Eyes: Negative for eye problems and icterus.  Respiratory: Positive for shortness of breath with exertion and baseline cough. Negative for cough and wheezing.   Cardiovascular: Negative for chest pain and leg swelling.  Gastrointestinal: Negative for abdominal pain, constipation, diarrhea, nausea and vomiting.  Genitourinary: Negative for bladder incontinence, difficulty urinating, dysuria, frequency and hematuria.   Musculoskeletal: Negative for back pain, gait problem, neck pain and neck stiffness.  Skin: Negative for itching and rash.  Neurological: Negative for dizziness, extremity weakness, gait problem, headaches, light-headedness and seizures.   Hematological: Negative for adenopathy. Does not bruise/bleed easily.  Psychiatric/Behavioral: Negative for confusion, depression and sleep disturbance. The patient is not nervous/anxious.     PHYSICAL EXAMINATION:  Blood pressure (!) 111/51, pulse 90, temperature 98 F (36.7 C), temperature source Temporal, resp. rate 18, height 5\' 8"  (1.727 m), weight 160 lb 8 oz (72.8 kg), SpO2 100 %.  ECOG PERFORMANCE STATUS: 1 - Symptomatic but completely ambulatory  Physical Exam  Review of Systems  Constitutional: Positive for fatigue and appetite changes. Negative for chills, fever and unexpected weight change.  HENT: Negative for mouth sores, nosebleeds, sore throat and trouble swallowing.   Eyes: Negative for eye problems and icterus.  Respiratory: Positive for shortness of breath with exertion and baseline cough. Negative for cough and wheezing.   Cardiovascular: Negative for chest pain and leg swelling.  Gastrointestinal: Negative for abdominal pain, constipation, diarrhea, nausea and vomiting.  Genitourinary: Positive for urinary hesitancy and an abnormal odor to his urine. Negative for bladder incontinence, dysuria, frequency and hematuria.   Musculoskeletal: Positive for back pain. Negative for gait problem, neck pain and  neck stiffness.  Skin: Negative for itching and rash.  Neurological: Negative for dizziness, extremity weakness, gait problem, headaches, light-headedness and seizures.  Hematological: Negative for adenopathy. Does not bruise/bleed easily.  Psychiatric/Behavioral: Negative for confusion, depression and sleep disturbance. The patient is not nervous/anxious.    LABORATORY DATA: Lab Results  Component Value Date   WBC 5.1 09/23/2019   HGB 9.3 (L) 09/23/2019   HCT 28.0 (L) 09/23/2019   MCV 123.9 (H) 09/23/2019   PLT 230 09/23/2019      Chemistry      Component Value Date/Time   NA 140 09/23/2019 0936   K 4.0 09/23/2019 0936   CL 108 09/23/2019 0936   CO2 24  09/23/2019 0936   BUN 13 09/23/2019 0936   CREATININE 0.92 09/23/2019 0936      Component Value Date/Time   CALCIUM 8.6 (L) 09/23/2019 0936   ALKPHOS 98 09/23/2019 0936   AST 14 (L) 09/23/2019 0936   ALT 10 09/23/2019 0936   BILITOT 1.3 (H) 09/23/2019 0936       RADIOGRAPHIC STUDIES:  Ct Chest W Contrast  Result Date: 09/08/2019 CLINICAL DATA:  Lung cancer. Radiation therapy and chemotherapy complete. Increasing shortness of breath. EXAM: CT CHEST WITH CONTRAST TECHNIQUE: Multidetector CT imaging of the chest was performed during intravenous contrast administration. CONTRAST:  69mL OMNIPAQUE IOHEXOL 300 MG/ML  SOLN COMPARISON:  05/23/2019. FINDINGS: Cardiovascular: Right IJ Port-A-Cath terminates in the low SVC. Atherosclerotic calcification of the aorta, aortic valve and coronary arteries. Heart is at the upper limits of normal in size to mildly enlarged. New small pericardial effusion. Mediastinum/Nodes: Mediastinal lymph nodes are not enlarged by CT size criteria. 1.3 cm subcarinal lymph node, stable. No hilar or axillary adenopathy. Esophagus is unremarkable. Lungs/Pleura: Centrilobular emphysema. Image quality is degraded by respiratory motion. Peribronchovascular consolidation in the anterior segment right upper lobe is new, indicative of an infectious or inflammatory etiology. Patchy consolidation, bronchiectasis and parenchymal retraction in the lingula and left lower lobe appear slightly more organized than on the prior exam. Moderate, partially loculated left pleural effusion, increased. Small right pleural effusion, new. Upper Abdomen: Subcentimeter low-attenuation lesions in the liver are too small to characterize but unchanged. Visualized portions of the liver and adrenal glands are otherwise unremarkable. Low and high attenuation lesions in the kidneys measure up to 2.6 cm on the left and are difficult to further characterize due to size and/or lack of precontrast imaging. Visualized  portions of the spleen, pancreas, stomach and bowel are unremarkable. Musculoskeletal: No worrisome lytic or sclerotic lesions. Mild compression of the T12 vertebral body is unchanged. IMPRESSION: 1. Evolutionary changes of radiation therapy in the left hemithorax. No evidence of recurrent or metastatic disease. 2. New small pericardial effusion. 3. Moderate loculated left pleural effusion, increased. Small right pleural effusion, new. 4. Aortic atherosclerosis (ICD10-170.0). Coronary artery calcification. 5.  Emphysema (ICD10-J43.9). Electronically Signed   By: Lorin Picket M.D.   On: 09/08/2019 09:23     ASSESSMENT/PLAN:  This is a very pleasant 78 year old Caucasian male diagnosed with stage IIIb non-small cell lung cancer, adenocarcinoma of the left lower lobe. He was diagnosed in January 2020. He has no actionable mutations.  The patient underwent concurrent chemoradiation with carboplatin for an AUC of 2 and paclitaxel 45 mg/m. He is status post 5 cycles. He was unable to proceed with cycle #6 secondary to thrombocytopenia.   The patient is currently undergoing consolidation immunotherapy with Imfinzi 10 mg/kg IV every 2 weeks. Status post13cycles.He has been tolerating  treatment well without any adverse side effects.   Labs were reviewed with the patient.  Recommend that he proceed with cycle #14 today scheduled.  We will see the patient back for a follow-up visit in 2 weeks for evaluation before starting cycle #15.  The patient had a urinalysis performed while in the clinic today which was negative for any signs of a urinary tract infection.   The patient will follow up with his pulmonologist in 2 weeks as scheduled for evaluation of his breathing.  The patient will continue to take his current pain medication for his back pain.  The patient will have his echocardiogram performed tomorrow as scheduled.  The patient was advised to call immediately if he has any concerning  symptoms in the interval. The patient voices understanding of current disease status and treatment options and is in agreement with the current care plan. All questions were answered. The patient knows to call the clinic with any problems, questions or concerns. We can certainly see the patient much sooner if necessary   Orders Placed This Encounter  Procedures  . Urine Culture    Standing Status:   Future    Number of Occurrences:   1    Standing Expiration Date:   09/22/2020  . Urinalysis, Complete w Microscopic    Standing Status:   Future    Number of Occurrences:   1    Standing Expiration Date:   09/22/2020     Fritzie Prioleau L Shawnta Schlegel, PA-C 09/23/19

## 2019-09-24 ENCOUNTER — Ambulatory Visit (HOSPITAL_COMMUNITY): Payer: Medicare HMO | Attending: Cardiovascular Disease

## 2019-09-24 DIAGNOSIS — R011 Cardiac murmur, unspecified: Secondary | ICD-10-CM | POA: Insufficient documentation

## 2019-09-24 LAB — URINE CULTURE: Culture: NO GROWTH

## 2019-09-26 NOTE — Telephone Encounter (Signed)
Was able to speak with Robert Dixon with [employer] (she was having a difficult time getting through to our office). She does still have the order placed that 5.27.2020 visit with Rexene Edison NP.  Patient is "number 13" on the list to receive POC.  Called spoke with patient, he is grateful for the information. Nothing further needed; will sign off.

## 2019-09-29 DIAGNOSIS — J439 Emphysema, unspecified: Secondary | ICD-10-CM | POA: Diagnosis not present

## 2019-10-01 ENCOUNTER — Encounter: Payer: Self-pay | Admitting: Pulmonary Disease

## 2019-10-01 ENCOUNTER — Ambulatory Visit (INDEPENDENT_AMBULATORY_CARE_PROVIDER_SITE_OTHER): Payer: Medicare HMO

## 2019-10-01 ENCOUNTER — Other Ambulatory Visit: Payer: Self-pay

## 2019-10-01 ENCOUNTER — Ambulatory Visit (INDEPENDENT_AMBULATORY_CARE_PROVIDER_SITE_OTHER): Payer: Medicare HMO | Admitting: Pulmonary Disease

## 2019-10-01 VITALS — BP 118/68 | HR 100 | Temp 97.6°F | Ht 68.5 in | Wt 158.8 lb

## 2019-10-01 DIAGNOSIS — J9611 Chronic respiratory failure with hypoxia: Secondary | ICD-10-CM | POA: Diagnosis not present

## 2019-10-01 DIAGNOSIS — J701 Chronic and other pulmonary manifestations due to radiation: Secondary | ICD-10-CM | POA: Diagnosis not present

## 2019-10-01 DIAGNOSIS — Z923 Personal history of irradiation: Secondary | ICD-10-CM

## 2019-10-01 DIAGNOSIS — J432 Centrilobular emphysema: Secondary | ICD-10-CM

## 2019-10-01 DIAGNOSIS — Z5112 Encounter for antineoplastic immunotherapy: Secondary | ICD-10-CM | POA: Diagnosis not present

## 2019-10-01 DIAGNOSIS — J90 Pleural effusion, not elsewhere classified: Secondary | ICD-10-CM | POA: Diagnosis not present

## 2019-10-01 DIAGNOSIS — Z9221 Personal history of antineoplastic chemotherapy: Secondary | ICD-10-CM | POA: Diagnosis not present

## 2019-10-01 DIAGNOSIS — C3412 Malignant neoplasm of upper lobe, left bronchus or lung: Secondary | ICD-10-CM | POA: Diagnosis not present

## 2019-10-01 DIAGNOSIS — C3492 Malignant neoplasm of unspecified part of left bronchus or lung: Secondary | ICD-10-CM

## 2019-10-01 MED ORDER — ALBUTEROL SULFATE (2.5 MG/3ML) 0.083% IN NEBU: 2.5000 mg | INHALATION_SOLUTION | RESPIRATORY_TRACT | 0 refills | Status: DC | PRN

## 2019-10-01 MED ORDER — BREZTRI AEROSPHERE 160-9-4.8 MCG/ACT IN AERO: 2.0000 | INHALATION_SPRAY | Freq: Two times a day (BID) | RESPIRATORY_TRACT | 0 refills | Status: DC

## 2019-10-01 NOTE — Addendum Note (Signed)
Addended by: June Leap on: 10/01/2019 02:32 PM   Modules accepted: Orders

## 2019-10-01 NOTE — Patient Instructions (Addendum)
Thank you for visiting [name] at [facility]. Today we recommend the following:  Orders Placed This Encounter  Procedures  . DG Chest 2 View   Nebulizer machine with albuterol solution to be used as needed for SOB Breztri samples today   Return in about 6 weeks (around 11/12/2019).    Please do your part to reduce the spread of COVID-19.

## 2019-10-01 NOTE — Addendum Note (Signed)
Addended by: Vivia Ewing on: 10/01/2019 03:10 PM   Modules accepted: Orders

## 2019-10-01 NOTE — Addendum Note (Signed)
Addended by: Vivia Ewing on: 10/01/2019 03:04 PM   Modules accepted: Orders

## 2019-10-01 NOTE — Progress Notes (Signed)
Synopsis: Referred in January 2020 for abnormal CT, lung mass, mediastinal adenopathy by [name], MD  Subjective:   PATIENT ID: Robert Dixon: male DOB: 20-Aug-1941, MRN: [id number]  Chief Complaint  Patient presents with  . Follow-up    Reports his breathing has been at his baseline. Aware the POC's are back logged. Using 2L pulsed.     PMH of former smoker. He had a cough for several weeks, month treated with abx. He started coughing up blood.  Patient was taken to [facility] where he was treated for pneumonia.  He was given a steroid pack and antibiotics.  He was treated with bronchodilators while he was there.  CT scan imaging revealed a left infrahilar mass with endobronchial disease within the upper lobe as well as enlarged mediastinal adenopathy.  Patient was referred to our pulmonary office for evaluation of potential underlying lung cancer.  OV 06/18/2019: Since we were last seen each other in the office patient has underwent bronchoscopy back in January for diagnosis of his lung cancer.  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  He has subsequently underwent concurrent chemoradiation with weekly carboplatinum and paclitaxel and his now on consolidation immunotherapy Imfinzi every 2 weeks.  He has been seen in the office over the past month with a diagnosis of pneumonia.  He was treated for this as an outpatient and has overall recovered.  He does feel like his functional status has slowly declined.  He feels much weaker than he did before.  But has been stable over the past few weeks.  He does feel better after treatment for pneumonia.  He had follow-up CAT scan imaging which reveals radiation changes to the left lung.  At this time he is still using his oxygen continuously.  CT scan revealed radiation fibrosis.  OV 10/01/2019:Patient followed in clinic today for history of lung  cancer, undergoing immunotherapy treatments following concurrent chemoradiation.  Currently on Imfinzi.  Patient last seen by oncology on 09/23/2019.  Patient doing well.  Here today for follow-up regarding dyspnea.  He does feel more short of breath than was before.  He did have urinary symptoms with Trelegy and this was stopped.  Urinary symptoms are stable at this time no hesitancy.  He recently had CT imaging of the chest in the middle of October which revealed a loculated left side pleural effusion.  He still has significant radiation-induced fibrosis of the left lung status post his chemo XRT.  Otherwise doing well.  He states he has follow-up planned with cardiology.  Has a echocardiogram with severe aortic stenosis with an aortic valve area of 0.78 and a mean gradient of 39.  Past Medical History:  Diagnosis Date  . Cancer ([place])    skin-   . Complication of anesthesia   . COPD (chronic obstructive pulmonary disease) ([place])   . Dyspnea   . GERD (gastroesophageal reflux disease)   . Hemoptysis 11/20/2018  . History of hiatal hernia   . Hypertension   . Hypoxia 11/20/2018  . 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 Migraine with visual aura   . Non-small cell lung cancer ([place]) dx'd 11/20/18  . Pneumonia 11/20/2018  . PONV (postoperative nausea and vomiting)   . Pulmonary nodule 11/20/2018     Family History  Problem Relation Age of Onset  . Cancer - Lung Mother   . Hypertension Father   . CVA  Father   . Cancer - Cervical Sister      Past Surgical History:  Procedure Laterality Date  . COLONOSCOPY W/ POLYPECTOMY    . IR IMAGING GUIDED PORT INSERTION  03/14/2019  . TONSILLECTOMY    . VASECTOMY    . VIDEO BRONCHOSCOPY WITH ENDOBRONCHIAL ULTRASOUND N/A 12/06/2018   Procedure: VIDEO BRONCHOSCOPY WITH ENDOBRONCHIAL ULTRASOUND;  Surgeon: Garner Nash, DO;  Location: [facility] OR;  Service: Thoracic;  Laterality: N/A;    Social History   Socioeconomic History  . Marital status: Widowed     Spouse name: Not on file  . Number of children: Not on file  . Years of education: Not on file  . Highest education level: Not on file  Occupational History  . Not on file  Social Needs  . Financial resource strain: Not on file  . Food insecurity    Worry: Not on file    Inability: Not on file  . Transportation needs    Medical: No    Non-medical: No  Tobacco Use  . Smoking status: Former Smoker    Years: 30.00    Types: Cigarettes    Quit date: 2000    Years since quitting: 20.8  . Smokeless tobacco: Never Used  Substance and Sexual Activity  . Alcohol use: No  . Drug use: No  . Sexual activity: Not on file  Lifestyle  . Physical activity    Days per week: Not on file    Minutes per session: Not on file  . Stress: Not on file  Relationships  . Social Herbalist on phone: Not on file    Gets together: Not on file    Attends religious service: Not on file    Active member of club or organization: Not on file    Attends meetings of clubs or organizations: Not on file    Relationship status: Not on file  . Intimate partner violence    Fear of current or ex partner: Not on file    Emotionally abused: Not on file    Physically abused: Not on file    Forced sexual activity: Not on file  Other Topics Concern  . Not on file  Social History Narrative  . Not on file     No Known Allergies   Outpatient Medications Prior to Visit  Medication Sig Dispense Refill  . albuterol (PROVENTIL HFA;VENTOLIN HFA) 108 (90 Base) MCG/ACT inhaler Inhale 2 puffs into the lungs every 6 (six) hours as needed for wheezing or shortness of breath. 1 Inhaler 6  . benazepril (LOTENSIN) 10 MG tablet Take 10 mg by mouth daily.    . Cyanocobalamin (VITAMIN B 12 PO) Take 1 capsule by mouth daily. 2500 mcg    . dimenhyDRINATE (DRAMAMINE) 50 MG tablet Take 50 mg by mouth every 8 (eight) hours as needed for dizziness.     H[place] Oris (IMFINZI IV) Inject into the vein every 14 (fourteen)  days.    Marland Kitchen lidocaine-prilocaine (EMLA) cream Apply 1 application topically as needed. 30 g 0  . Multiple Vitamin (MULTIVITAMIN) tablet Take 1 tablet by mouth daily.    . pantoprazole (PROTONIX) 40 MG tablet Take 40 mg by mouth 2 (two) times daily.    . polycarbophil (FIBERCON) 625 MG tablet Take 625 mg by mouth 2 (two) times daily.     . prochlorperazine (COMPAZINE) 10 MG tablet TAKE ONE TABLET BY MOUTH EVERY 6 HOURS AS NEEDED FOR NAUSEA OR VOMITING 30  tablet 0  . traMADol (ULTRAM) 50 MG tablet Take 50 mg by mouth 2 (two) times daily as needed.     Facility-Administered Medications Prior to Visit  Medication Dose Route Frequency Provider Last Rate Last Dose  . sodium chloride flush (NS) 0.9 % injection 10 mL  10 mL Intracatheter PRN [name], MD      . sodium chloride flush (NS) 0.9 % injection 10 mL  10 mL Intracatheter PRN [name], MD   10 mL at 05/20/19 1232    Review of Systems  Constitutional: Negative for chills, fever, malaise/fatigue and weight loss.  HENT: Negative for hearing loss, sore throat and tinnitus.   Eyes: Negative for blurred vision and double vision.  Respiratory: Positive for shortness of breath. Negative for cough, hemoptysis, sputum production, wheezing and stridor.   Cardiovascular: Negative for chest pain, palpitations, orthopnea, leg swelling and PND.  Gastrointestinal: Negative for abdominal pain, constipation, diarrhea, heartburn, nausea and vomiting.  Genitourinary: Negative for dysuria, hematuria and urgency.  Musculoskeletal: Negative for joint pain and myalgias.  Skin: Negative for itching and rash.  Neurological: Negative for dizziness, tingling, weakness and headaches.  Endo/Heme/Allergies: Negative for environmental allergies. Does not bruise/bleed easily.  Psychiatric/Behavioral: Negative for depression. The patient is nervous/anxious. The patient does not have insomnia.   All other systems reviewed and are negative.    Objective:   Physical Exam Vitals signs reviewed.  Constitutional:      General: He is not in acute distress.    Appearance: He is well-developed.  HENT:     Head: Normocephalic and atraumatic.     Mouth/Throat:     Pharynx: No oropharyngeal exudate.  Eyes:     Conjunctiva/sclera: Conjunctivae normal.     Pupils: Pupils are equal, round, and reactive to light.  Neck:     Vascular: No JVD.     Trachea: No tracheal deviation.     Comments: Loss of supraclavicular fat Cardiovascular:     Rate and Rhythm: Normal rate and regular rhythm.     Heart sounds: S1 normal and S2 normal. Murmur (systolic ) present.     Comments: Distant heart tones Pulmonary:     Effort: No tachypnea or accessory muscle usage.     Breath sounds: No stridor. Decreased breath sounds (throughout all lung fields) present. No wheezing, rhonchi or rales.  Abdominal:     General: Bowel sounds are normal. There is no distension.     Palpations: Abdomen is soft.     Tenderness: There is no abdominal tenderness.  Musculoskeletal:        General: Deformity (muscle wasting ) present.  Skin:    General: Skin is warm and dry.     Capillary Refill: Capillary refill takes less than 2 seconds.     Findings: No rash.  Neurological:     Mental Status: He is alert and oriented to person, place, and time.  Psychiatric:        Behavior: Behavior normal.      Vitals:   10/01/19 1343 10/01/19 1344  BP:  118/68  Pulse:  100  Temp:  97.6 F (36.4 C)  TempSrc:  Temporal  SpO2:  96%  Weight: 158 lb 12.8 oz (72 kg) 158 lb 12.8 oz (72 kg)  Height: 5' 8.5" (1.74 m) 5' 8.5" (1.74 m)   96% on RA BMI Readings from Last 3 Encounters:  10/01/19 23.79 kg/m  09/23/19 24.40 kg/m  09/09/19 25.33 kg/m   Wt Readings from Last  3 Encounters:  10/01/19 158 lb 12.8 oz (72 kg)  09/23/19 160 lb 8 oz (72.8 kg)  09/09/19 166 lb 9.6 oz (75.6 kg)    CBC    Component Value Date/Time   WBC 5.1 09/23/2019 0936   WBC 6.5 03/14/2019 0929    RBC 2.26 (L) 09/23/2019 0936   HGB 9.3 (L) 09/23/2019 0936   HCT 28.0 (L) 09/23/2019 0936   PLT 230 09/23/2019 0936   MCV 123.9 (H) 09/23/2019 0936   MCH 41.2 (H) 09/23/2019 0936   MCHC 33.2 09/23/2019 0936   RDW 14.6 09/23/2019 0936   LYMPHSABS 0.5 (L) 09/23/2019 0936   MONOABS 0.5 09/23/2019 0936   EOSABS 0.0 09/23/2019 0936   BASOSABS 0.1 09/23/2019 0936    Chest Imaging: CT chest 11/20/2018: Left hilar mass with associated adenopathy endobronchial narrowing, subcarinal and bilateral hilar adenopathy. Ill-defined small density within the liver possible focus of metastatic disease.  CT chest July 2020: Fibrotic post radiation changes into the left chest, pleural thickening, intralobular septal thickening The patient's images have been independently reviewed by me.    CT chest October 2020: Fibrotic changes of the left lung pleural thickening interlobular septal thickening and banding consistent with radiation treatments.  Also has a loculated left-sided effusion.  Pleural effusion is larger in comparison to previous CT imaging. The patient's images have been independently reviewed by me.    Pulmonary Functions Testing Results: No flowsheet data found.  FeNO: None   Pathology: None   Echocardiogram:  October 2020: Severe calcific aortic stenosis.  Please see echo report for details  Heart Catheterization: None     Assessment & Plan:     ICD-10-CM   1. Pleural effusion  J90 DG Chest 2 View  2. Primary malignant neoplasm of left upper lobe of lung (HCC)  C34.12   3. Chronic respiratory failure with hypoxia (HCC)  J96.11   4. Centrilobular emphysema ([place])  J43.2   5. Non-small cell carcinoma of left lung, stage 3 (HCC)  C34.92   6. S/P radiation therapy > 12 wks ago  Z92.3   7. S/P chemotherapy, time since greater than 12 weeks  Z92.21   8. 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 Maintenance antineoplastic immunotherapy  Z51.12     Discussion:  78 year old  gentleman, former smoker diagnosed with left hilar mass associated mediastinal adenopathy and stage IIIb adenocarcinoma of the lung patient underwent chemo XRT plus consolidative immunotherapy.  Currently on Imfinzi every 2 weeks.  Overall does have significant dyspnea and shortness of breath.  Recent echocardiogram with evidence of severely calcified aortic valve which is likely playing a role.  Also has an enlarging loculated left-sided lower lobe effusion on CT imaging.  Plan: Needs to be restarted on COPD management. We will give him samples of Breztri.  He was on Trelegy inhaler and had some urinary symptoms. We will observe him and he supposed to let us know if he has any return of his urinary hesitancy. We will have a two-view chest x-ray today to see if the effusion is any larger since October. Pending this chest film make recommendations regarding potential sampling of pleural effusion. Patient to follow-up in our clinic in 6 weeks or sooner depending upon symptomatology.  Greater than 50% of this patient's 25-minute office was spent face-to-face discussing above recommendations and treatment plan.   Current Outpatient Medications:  .  albuterol (PROVENTIL HFA;VENTOLIN HFA) 108 (90 Base) MCG/ACT inhaler, Inhale 2 puffs into the lungs  every 6 (six) hours as needed for wheezing or shortness of breath., Disp: 1 Inhaler, Rfl: 6 .  benazepril (LOTENSIN) 10 MG tablet, Take 10 mg by mouth daily., Disp: , Rfl:  .  Cyanocobalamin (VITAMIN B 12 PO), Take 1 capsule by mouth daily. 2500 mcg, Disp: , Rfl:  .  dimenhyDRINATE (DRAMAMINE) 50 MG tablet, Take 50 mg by mouth every 8 (eight) hours as needed for dizziness. , Disp: , Rfl:  .  Durvalumab (IMFINZI IV), Inject into the vein every 14 (fourteen) days., Disp: , Rfl:  .  lidocaine-prilocaine (EMLA) cream, Apply 1 application topically as needed., Disp: 30 g, Rfl: 0 .  Multiple Vitamin (MULTIVITAMIN) tablet, Take 1 tablet by mouth daily., Disp: ,  Rfl:  .  pantoprazole (PROTONIX) 40 MG tablet, Take 40 mg by mouth 2 (two) times daily., Disp: , Rfl:  .  polycarbophil (FIBERCON) 625 MG tablet, Take 625 mg by mouth 2 (two) times daily. , Disp: , Rfl:  .  prochlorperazine (COMPAZINE) 10 MG tablet, TAKE ONE TABLET BY MOUTH EVERY 6 HOURS AS NEEDED FOR NAUSEA OR VOMITING, Disp: 30 tablet, Rfl: 0 .  traMADol (ULTRAM) 50 MG tablet, Take 50 mg by mouth 2 (two) times daily as needed., Disp: , Rfl:  No current facility-administered medications for this visit.   Facility-Administered Medications Ordered in Other Visits:  .  sodium chloride flush (NS) 0.9 % injection 10 mL, 10 mL, Intracatheter, PRN, [name], MD .  sodium chloride flush (NS) 0.9 % injection 10 mL, 10 mL, Intracatheter, PRN, [name], MD, 10 mL at 05/20/19 1232   Garner Nash, DO [facility] 10/01/2019 2:07 PM

## 2019-10-02 ENCOUNTER — Telehealth: Payer: Self-pay | Admitting: Pulmonary Disease

## 2019-10-02 DIAGNOSIS — C3492 Malignant neoplasm of unspecified part of left bronchus or lung: Secondary | ICD-10-CM | POA: Diagnosis not present

## 2019-10-02 DIAGNOSIS — C3412 Malignant neoplasm of upper lobe, left bronchus or lung: Secondary | ICD-10-CM | POA: Diagnosis not present

## 2019-10-02 DIAGNOSIS — J90 Pleural effusion, not elsewhere classified: Secondary | ICD-10-CM | POA: Diagnosis not present

## 2019-10-02 DIAGNOSIS — J439 Emphysema, unspecified: Secondary | ICD-10-CM | POA: Diagnosis not present

## 2019-10-02 DIAGNOSIS — J9600 Acute respiratory failure, unspecified whether with hypoxia or hypercapnia: Secondary | ICD-10-CM | POA: Diagnosis not present

## 2019-10-02 DIAGNOSIS — J9611 Chronic respiratory failure with hypoxia: Secondary | ICD-10-CM | POA: Diagnosis not present

## 2019-10-02 DIAGNOSIS — J432 Centrilobular emphysema: Secondary | ICD-10-CM | POA: Diagnosis not present

## 2019-10-02 MED ORDER — ALBUTEROL SULFATE (2.5 MG/3ML) 0.083% IN NEBU: 2.5000 mg | INHALATION_SOLUTION | RESPIRATORY_TRACT | 0 refills | Status: DC

## 2019-10-02 NOTE — Telephone Encounter (Signed)
Rx has been resent to [place] with information requested.

## 2019-10-03 NOTE — Progress Notes (Signed)
We will see.

## 2019-10-04 ENCOUNTER — Other Ambulatory Visit (HOSPITAL_COMMUNITY)
Admission: RE | Admit: 2019-10-04 | Discharge: 2019-10-04 | Disposition: A | Discharge status: Home / Self Care | Payer: Medicare HMO | Source: Ambulatory Visit | Attending: Internal Medicine | Admitting: Internal Medicine

## 2019-10-04 DIAGNOSIS — Z20828 Contact with and (suspected) exposure to other viral communicable diseases: Secondary | ICD-10-CM | POA: Diagnosis not present

## 2019-10-04 DIAGNOSIS — Z01812 Encounter for preprocedural laboratory examination: Secondary | ICD-10-CM | POA: Insufficient documentation

## 2019-10-05 LAB — NOVEL CORONAVIRUS, NAA (HOSP ORDER, SEND-OUT TO REF LAB; TAT 18-24 HRS): SARS-CoV-2, NAA: NOT DETECTED

## 2019-10-06 ENCOUNTER — Telehealth: Payer: Self-pay | Admitting: Internal Medicine

## 2019-10-06 ENCOUNTER — Telehealth: Payer: Self-pay | Admitting: Medical Oncology

## 2019-10-06 NOTE — Telephone Encounter (Signed)
Called pt per 11/9 sch message- no answer .

## 2019-10-06 NOTE — Telephone Encounter (Signed)
Appt /covid test-Tested for Covid on sat  for thoracentesis scheduled for  wed. ( Covid test negative) Can he come to his appt tomorrow?   I told pt that he cannot come to his appt Tuesday because he  has to quarantine preprocedure.  Schedule message sent to r/s appts for wed, Thursday or Friday.

## 2019-10-07 ENCOUNTER — Inpatient Hospital Stay: Payer: Medicare HMO

## 2019-10-07 ENCOUNTER — Inpatient Hospital Stay: Payer: Medicare HMO | Admitting: Internal Medicine

## 2019-10-08 ENCOUNTER — Ambulatory Visit (HOSPITAL_COMMUNITY)
Admission: RE | Admit: 2019-10-08 | Discharge: 2019-10-08 | Disposition: A | Discharge status: Home / Self Care | Payer: Medicare HMO | Source: Ambulatory Visit | Attending: Internal Medicine | Admitting: Internal Medicine

## 2019-10-08 ENCOUNTER — Inpatient Hospital Stay (HOSPITAL_BASED_OUTPATIENT_CLINIC_OR_DEPARTMENT_OTHER): Payer: Medicare HMO | Admitting: Physician Assistant

## 2019-10-08 ENCOUNTER — Inpatient Hospital Stay: Payer: Medicare HMO | Attending: Internal Medicine

## 2019-10-08 ENCOUNTER — Other Ambulatory Visit: Payer: Self-pay

## 2019-10-08 ENCOUNTER — Inpatient Hospital Stay: Payer: Medicare HMO

## 2019-10-08 VITALS — BP 104/56 | HR 100 | Temp 98.9°F | Resp 17 | Ht 68.5 in | Wt 154.7 lb

## 2019-10-08 DIAGNOSIS — C3432 Malignant neoplasm of lower lobe, left bronchus or lung: Secondary | ICD-10-CM | POA: Insufficient documentation

## 2019-10-08 DIAGNOSIS — Z9981 Dependence on supplemental oxygen: Secondary | ICD-10-CM | POA: Insufficient documentation

## 2019-10-08 DIAGNOSIS — J90 Pleural effusion, not elsewhere classified: Secondary | ICD-10-CM

## 2019-10-08 DIAGNOSIS — Z79899 Other long term (current) drug therapy: Secondary | ICD-10-CM | POA: Diagnosis not present

## 2019-10-08 DIAGNOSIS — I350 Nonrheumatic aortic (valve) stenosis: Secondary | ICD-10-CM | POA: Diagnosis not present

## 2019-10-08 DIAGNOSIS — I10 Essential (primary) hypertension: Secondary | ICD-10-CM | POA: Diagnosis not present

## 2019-10-08 DIAGNOSIS — C3492 Malignant neoplasm of unspecified part of left bronchus or lung: Secondary | ICD-10-CM

## 2019-10-08 DIAGNOSIS — Z5112 Encounter for antineoplastic immunotherapy: Secondary | ICD-10-CM | POA: Insufficient documentation

## 2019-10-08 DIAGNOSIS — K219 Gastro-esophageal reflux disease without esophagitis: Secondary | ICD-10-CM | POA: Insufficient documentation

## 2019-10-08 DIAGNOSIS — Z923 Personal history of irradiation: Secondary | ICD-10-CM | POA: Insufficient documentation

## 2019-10-08 DIAGNOSIS — Z85828 Personal history of other malignant neoplasm of skin: Secondary | ICD-10-CM | POA: Diagnosis not present

## 2019-10-08 DIAGNOSIS — J449 Chronic obstructive pulmonary disease, unspecified: Secondary | ICD-10-CM | POA: Diagnosis not present

## 2019-10-08 DIAGNOSIS — Z9221 Personal history of antineoplastic chemotherapy: Secondary | ICD-10-CM | POA: Diagnosis not present

## 2019-10-08 DIAGNOSIS — Z9889 Other specified postprocedural states: Secondary | ICD-10-CM | POA: Diagnosis not present

## 2019-10-08 DIAGNOSIS — F17200 Nicotine dependence, unspecified, uncomplicated: Secondary | ICD-10-CM | POA: Insufficient documentation

## 2019-10-08 DIAGNOSIS — Z95828 Presence of other vascular implants and grafts: Secondary | ICD-10-CM

## 2019-10-08 DIAGNOSIS — M549 Dorsalgia, unspecified: Secondary | ICD-10-CM | POA: Insufficient documentation

## 2019-10-08 DIAGNOSIS — D6959 Other secondary thrombocytopenia: Secondary | ICD-10-CM | POA: Diagnosis not present

## 2019-10-08 DIAGNOSIS — R5383 Other fatigue: Secondary | ICD-10-CM | POA: Diagnosis not present

## 2019-10-08 LAB — CBC WITH DIFFERENTIAL (CANCER CENTER ONLY)
Abs Immature Granulocytes: 0.02 10*3/uL (ref 0.00–0.07)
Basophils Absolute: 0.1 10*3/uL (ref 0.0–0.1)
Basophils Relative: 1 %
Eosinophils Absolute: 0.1 10*3/uL (ref 0.0–0.5)
Eosinophils Relative: 1 %
HCT: 28 % — ABNORMAL LOW (ref 39.0–52.0)
Hemoglobin: 9.3 g/dL — ABNORMAL LOW (ref 13.0–17.0)
Immature Granulocytes: 0 %
Lymphocytes Relative: 10 %
Lymphs Abs: 0.6 10*3/uL — ABNORMAL LOW (ref 0.7–4.0)
MCH: 41.3 pg — ABNORMAL HIGH (ref 26.0–34.0)
MCHC: 33.2 g/dL (ref 30.0–36.0)
MCV: 124.4 fL — ABNORMAL HIGH (ref 80.0–100.0)
Monocytes Absolute: 0.6 10*3/uL (ref 0.1–1.0)
Monocytes Relative: 9 %
Neutro Abs: 5.1 10*3/uL (ref 1.7–7.7)
Neutrophils Relative %: 79 %
Platelet Count: 238 10*3/uL (ref 150–400)
RBC: 2.25 MIL/uL — ABNORMAL LOW (ref 4.22–5.81)
RDW: 14.6 % (ref 11.5–15.5)
WBC Count: 6.5 10*3/uL (ref 4.0–10.5)
nRBC: 0 % (ref 0.0–0.2)

## 2019-10-08 LAB — CMP (CANCER CENTER ONLY)
ALT: 10 U/L (ref 0–44)
AST: 14 U/L — ABNORMAL LOW (ref 15–41)
Albumin: 3.8 g/dL (ref 3.5–5.0)
Alkaline Phosphatase: 98 U/L (ref 38–126)
Anion gap: 7 (ref 5–15)
BUN: 18 mg/dL (ref 8–23)
CO2: 25 mmol/L (ref 22–32)
Calcium: 8.2 mg/dL — ABNORMAL LOW (ref 8.9–10.3)
Chloride: 108 mmol/L (ref 98–111)
Creatinine: 1.12 mg/dL (ref 0.61–1.24)
GFR, Est AFR Am: >60 mL/min (ref >60)
GFR, Estimated: >60 mL/min (ref >60)
Glucose, Bld: 149 mg/dL — ABNORMAL HIGH (ref 70–99)
Potassium: 4.1 mmol/L (ref 3.5–5.1)
Sodium: 140 mmol/L (ref 135–145)
Total Bilirubin: 0.8 mg/dL (ref 0.3–1.2)
Total Protein: 6.1 g/dL — ABNORMAL LOW (ref 6.5–8.1)

## 2019-10-08 LAB — BODY FLUID CELL COUNT WITH DIFFERENTIAL
Eos, Fluid: 0 %
Lymphs, Fluid: 96 %
Monocyte-Macrophage-Serous Fluid: 4 % — ABNORMAL LOW (ref 50–90)
Neutrophil Count, Fluid: 0 % (ref 0–25)
Total Nucleated Cell Count, Fluid: 1555 cu mm — ABNORMAL HIGH (ref 0–1000)

## 2019-10-08 LAB — GLUCOSE, PLEURAL OR PERITONEAL FLUID: Glucose, Fluid: 111 mg/dL

## 2019-10-08 LAB — PROTEIN, PLEURAL OR PERITONEAL FLUID: Total protein, fluid: 3.5 g/dL

## 2019-10-08 LAB — TSH: TSH: 1.184 u[IU]/mL (ref 0.320–4.118)

## 2019-10-08 MED ORDER — SODIUM CHLORIDE 0.9 % IV SOLN
Freq: Once | INTRAVENOUS | Status: AC
  Administered 2019-10-08: 15:00:00 via INTRAVENOUS
  Filled 2019-10-08: qty 250

## 2019-10-08 MED ORDER — SODIUM CHLORIDE 0.9% FLUSH
10.0000 mL | INTRAVENOUS | Status: DC | PRN
  Administered 2019-10-08: 10 mL
  Filled 2019-10-08: qty 10

## 2019-10-08 MED ORDER — SODIUM CHLORIDE 0.9 % IV SOLN
9.6000 mg/kg | Freq: Once | INTRAVENOUS | Status: AC
  Administered 2019-10-08: 740 mg via INTRAVENOUS
  Filled 2019-10-08: qty 10

## 2019-10-08 MED ORDER — HEPARIN SOD (PORK) LOCK FLUSH 100 UNIT/ML IV SOLN
500.0000 [IU] | Freq: Once | INTRAVENOUS | Status: AC | PRN
  Administered 2019-10-08: 500 [IU]
  Filled 2019-10-08: qty 5

## 2019-10-08 MED ORDER — SODIUM CHLORIDE 0.9% FLUSH
10.0000 mL | INTRAVENOUS | Status: DC | PRN
  Filled 2019-10-08: qty 10

## 2019-10-08 NOTE — Patient Instructions (Signed)
[facility] Discharge Instructions for Patients Receiving Chemotherapy  Today you received the following chemotherapy agents Durvalumab (IMFINZI).  To help prevent nausea and vomiting after your treatment, we encourage you to take your nausea medication as prescribed.   If you develop nausea and vomiting that is not controlled by your nausea medication, call the clinic.   BELOW ARE SYMPTOMS THAT SHOULD BE REPORTED IMMEDIATELY:  *FEVER GREATER THAN 100.5 F  *CHILLS WITH OR WITHOUT FEVER  NAUSEA AND VOMITING THAT IS NOT CONTROLLED WITH YOUR NAUSEA MEDICATION  *UNUSUAL SHORTNESS OF BREATH  *UNUSUAL BRUISING OR BLEEDING  TENDERNESS IN MOUTH AND THROAT WITH OR WITHOUT PRESENCE OF ULCERS  *URINARY PROBLEMS  *BOWEL PROBLEMS  UNUSUAL RASH Items with * indicate a potential emergency and should be followed up as soon as possible.  Feel free to call the clinic should you have any questions or concerns. The clinic phone number is [phone].  Please show the [facility] at check-in to the Emergency Department and triage nurse.  Coronavirus (COVID-19) Are you at risk?  Are you at risk for the Coronavirus (COVID-19)?  To be considered HIGH RISK for Coronavirus (COVID-19), you have to meet the following criteria:  . Traveled to [place], [place], [place], [place] or [place]; or in the [place] to [place], [place], [place], or [place]; and have fever, cough, and shortness of breath within the last 2 weeks of travel OR . Been in close contact with a person diagnosed with COVID-19 within the last 2 weeks and have fever, cough, and shortness of breath . IF YOU DO NOT MEET THESE CRITERIA, YOU ARE CONSIDERED LOW RISK FOR COVID-19.  What to do if you are HIGH RISK for COVID-19?  Marland Kitchen If you are having a medical emergency, call 911. . Seek medical care right away. Before you go to a doctor's office, urgent care or emergency department, call ahead and tell them  about your recent travel, contact with someone diagnosed with COVID-19, and your symptoms. You should receive instructions from your physician's office regarding next steps of care.  . When you arrive at healthcare provider, tell the healthcare staff immediately you have returned from visiting [place], [place], [place], [place] or [place]; or traveled in the [place] to [place], [place], [place], or [place]; in the last two weeks or you have been in close contact with a person diagnosed with COVID-19 in the last 2 weeks.   . Tell the health care staff about your symptoms: fever, cough and shortness of breath. . After you have been seen by a medical provider, you will be either: o Tested for (COVID-19) and discharged home on quarantine except to seek medical care if symptoms worsen, and asked to  - Stay home and avoid contact with others until you get your results (4-5 days)  - Avoid travel on public transportation if possible (such as bus, train, or airplane) or o Sent to the Emergency Department by EMS for evaluation, COVID-19 testing, and possible admission depending on your condition and test results.  What to do if you are LOW RISK for COVID-19?  Reduce your risk of any infection by using the same precautions used for avoiding the common cold or flu:  Marland Kitchen Wash your hands often with soap and warm water for at least 20 seconds.  If soap and water are not readily available, use an alcohol-based hand sanitizer with at least 60% alcohol.  . If coughing or  sneezing, cover your mouth and nose by coughing or sneezing into the elbow areas of your shirt or coat, into a tissue or into your sleeve (not your hands). . Avoid shaking hands with others and consider head nods or verbal greetings only. . Avoid touching your eyes, nose, or mouth with unwashed hands.  . Avoid close contact with people who are sick. . Avoid places or events with large numbers of people in one location, like concerts or  sporting events. . Carefully consider travel plans you have or are making. . If you are planning any travel outside or inside the [place], visit the CDC's Travelers' Health webpage for the latest health notices. . If you have some symptoms but not all symptoms, continue to monitor at home and seek medical attention if your symptoms worsen. . If you are having a medical emergency, call 911.   [place] / e-Visit: [email or web address]         [facility]: [facility]: [phone]                   [facility]: [phone]

## 2019-10-08 NOTE — Progress Notes (Signed)
Robert Dixon OFFICE PROGRESS NOTE  Robert Arabian, MD [place] [address] [employer] [address] [place] Robert Dixon [address]  DIAGNOSIS: Stage IIIB(T1b, N3, M0) non-small cell lung cancer favoring adenocarcinoma diagnosed in January 2020 and presented with left lower lobe pulmonary nodule in addition to bilateral hilar and subcarinal lymphadenopathy  PRIOR THERAPY: Concurrent chemoradiation with weekly carboplatin for an AUC of 2 and paclitaxel 45 mg/m2. Status post 5 cycles.  CURRENT THERAPY: Consolidation immunotherapy with Imfinzi 10 mg/kg IV every 2 weeks. First dose March 25, 2019.Status post14cycles.  INTERVAL HISTORY: Robert Dixon 78 y.o. male returns to the clinic for a follow up visit. The patient is feeling fairly well today but continues to experience shortness of breath which he believes has progressively getting worse over the last few months. 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 He also has radiation-induced fibrosis. He also had an echocardiogram performed recently to evaluate his murmur which showed severe aortic stenosis. He also has a small loculated pleural effusion. He is followed closely by pulmonology. He had a diagnostic thoracentesis today for his pleural effusion.   Today, the patient is otherwise feeling fairly well without any other concerning complaints. He denies any fever, chills, night sweats, or weight loss. Denies any chest pain or hemoptysis. Denies nausea, vomiting, diarrhea, or constipation. Denies any headache or visual changes. Denies any rashes or skin changes. He is here for evaluation before starting cycle #15.   MEDICAL HISTORY: Past Medical History:  Diagnosis Date  . Cancer ([place])    skin-   . Complication of anesthesia   . COPD (chronic obstructive pulmonary disease) ([place])   . Dyspnea   . GERD (gastroesophageal reflux disease)   . Hemoptysis 11/20/2018  . History of hiatal hernia   . Hypertension   . Hypoxia 11/20/2018  . 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 Migraine with visual aura   . Non-small cell lung cancer ([place]) dx'd 11/20/18  . Pneumonia 11/20/2018  . PONV (postoperative nausea and vomiting)   . Pulmonary nodule 11/20/2018    ALLERGIES:  has No Known Allergies.  MEDICATIONS:  Current Outpatient Medications  Medication Sig Dispense Refill  . albuterol (PROVENTIL HFA;VENTOLIN HFA) 108 (90 Base) MCG/ACT inhaler Inhale 2 puffs into the lungs every 6 (six) hours as needed for wheezing or shortness of breath. 1 Inhaler 6  . albuterol (PROVENTIL) (2.5 MG/3ML) 0.083% nebulizer solution Take 3 mLs (2.5 mg total) by nebulization every 4 (four) hours. 360 mL 0  . benazepril (LOTENSIN) 10 MG tablet Take 10 mg by mouth daily.    . Budeson-Glycopyrrol-Formoterol (BREZTRI AEROSPHERE) 160-9-4.8 MCG/ACT AERO Inhale 2 puffs into the lungs every 12 (twelve) hours. 1 g 0  . Cyanocobalamin (VITAMIN B 12 PO) Take 1 capsule by mouth daily. 2500 mcg    . dimenhyDRINATE (DRAMAMINE) 50 MG tablet Take 50 mg by mouth every 8 (eight) hours as needed for dizziness.     Hunt Oris (IMFINZI IV) Inject into the vein every 14 (fourteen) days.    Marland Kitchen lidocaine-prilocaine (EMLA) cream Apply 1 application topically as needed. 30 g 0  . Multiple Vitamin (MULTIVITAMIN) tablet Take 1 tablet by mouth daily.    . pantoprazole (PROTONIX) 40 MG tablet Take 40 mg by mouth 2 (two) times daily.    . polycarbophil (FIBERCON) 625 MG tablet Take 625 mg by mouth 2 (two) times daily.     . prochlorperazine (COMPAZINE) 10 MG tablet TAKE ONE TABLET BY MOUTH EVERY 6 HOURS AS NEEDED FOR  NAUSEA OR VOMITING 30 tablet 0  . traMADol (ULTRAM) 50 MG tablet Take 50 mg by mouth 2 (two) times daily as needed.     No current facility-administered medications for this visit.    Facility-Administered Medications Ordered in Other Visits  Medication Dose Route Frequency Provider Last Rate Last Dose  . durvalumab (IMFINZI) 780 mg in sodium chloride 0.9 % 100 mL chemo infusion  10 mg/kg  (Treatment Plan Recorded) Intravenous Once [name], MD      . heparin lock flush 100 unit/mL  500 Units Intracatheter Once PRN [name], MD      . sodium chloride flush (NS) 0.9 % injection 10 mL  10 mL Intracatheter PRN [name], MD      . sodium chloride flush (NS) 0.9 % injection 10 mL  10 mL Intracatheter PRN [name], MD   10 mL at 05/20/19 1232  . sodium chloride flush (NS) 0.9 % injection 10 mL  10 mL Intracatheter PRN [name], MD        SURGICAL HISTORY:  Past Surgical History:  Procedure Laterality Date  . COLONOSCOPY W/ POLYPECTOMY    . IR IMAGING GUIDED PORT INSERTION  03/14/2019  . TONSILLECTOMY    . VASECTOMY    . VIDEO BRONCHOSCOPY WITH ENDOBRONCHIAL ULTRASOUND N/A 12/06/2018   Procedure: VIDEO BRONCHOSCOPY WITH ENDOBRONCHIAL ULTRASOUND;  Surgeon: Garner Nash, DO;  Location: [facility] OR;  Service: Thoracic;  Laterality: N/A;    REVIEW OF SYSTEMS:   Review of Systems  Constitutional: Positive for fatigue. Negative for appetite change, chills, fever and unexpected weight change.  HENT: Negative for mouth sores, nosebleeds, sore throat and trouble swallowing.   Eyes: Negative for eye problems and icterus.  Respiratory: Positive for shortness of breath, Negative for cough, hemoptysis, and wheezing.   Cardiovascular: Negative for chest pain and leg swelling.  Gastrointestinal: Negative for abdominal pain, constipation, diarrhea, nausea and vomiting.  Genitourinary: Negative for bladder incontinence, difficulty urinating, dysuria, frequency and hematuria.   Musculoskeletal: Negative for back pain, gait problem, neck pain and neck stiffness.  Skin: Negative for itching and rash.  Neurological: Negative for dizziness, extremity weakness, gait problem, headaches, light-headedness and seizures.  Hematological: Negative for adenopathy. Does not bruise/bleed easily.  Psychiatric/Behavioral: Negative for confusion, depression and sleep  disturbance. The patient is not nervous/anxious.     PHYSICAL EXAMINATION:  Blood pressure (!) 104/56, pulse 100, temperature 98.9 F (37.2 C), temperature source Temporal, resp. rate 17, height 5' 8.5" (1.74 m), weight 154 lb 11.2 oz (70.2 kg), SpO2 96 %.  ECOG PERFORMANCE STATUS: 1 - Symptomatic but completely ambulatory  Physical Exam  Physical Exam  Constitutional: Oriented to person, place, and time and elderly appearing male and in no distress.  HENT:  Head: Normocephalic and atraumatic.  Mouth/Throat: Oropharynx is clear and moist. No oropharyngeal exudate.  Eyes: Conjunctivae are normal. Right eye exhibits no discharge. Left eye exhibits no discharge. No scleral icterus.  Neck: Normal range of motion. Neck supple.  Cardiovascular: Grade III systolic ejection murmur. Normal rate. Intact distal pulses.   Pulmonary/Chest: Effort normal. Decreased breath sounds in all lung fields. No respiratory distress. No wheezes. No rales.  Abdominal: Soft. Bowel sounds are normal. Exhibits no distension and no mass. There is no tenderness.  Musculoskeletal: Normal range of motion. Exhibits no edema.  Lymphadenopathy:    No cervical adenopathy.  Neurological: Alert and oriented to person, place, and time. Exhibits normal muscle tone. Gait normal. Coordination normal.  Skin: Skin  is warm and dry. No rash noted. Not diaphoretic. No erythema. No pallor.  Psychiatric: Mood, memory and judgment normal.  Vitals reviewed.  LABORATORY DATA: Lab Results  Component Value Date   WBC 6.5 10/08/2019   HGB 9.3 (L) 10/08/2019   HCT 28.0 (L) 10/08/2019   MCV 124.4 (H) 10/08/2019   PLT 238 10/08/2019      Chemistry      Component Value Date/Time   NA 140 10/08/2019 1343   K 4.1 10/08/2019 1343   CL 108 10/08/2019 1343   CO2 25 10/08/2019 1343   BUN 18 10/08/2019 1343   CREATININE 1.12 10/08/2019 1343      Component Value Date/Time   CALCIUM 8.2 (L) 10/08/2019 1343   ALKPHOS 98 10/08/2019 1343    AST 14 (L) 10/08/2019 1343   ALT 10 10/08/2019 1343   BILITOT 0.8 10/08/2019 1343       RADIOGRAPHIC STUDIES:  Dg Chest 1 View  Result Date: 10/08/2019 CLINICAL DATA:  Left pleural effusion. Status post thoracentesis. EXAM: CHEST  1 VIEW COMPARISON:  10/01/2019 FINDINGS: Left pleural effusion has nearly completely resolved since previous study. There is persistent opacity in the left retrocardiac lung base. No pneumothorax is visualized. Right lung remains clear. Right-sided power port remains in appropriate position. Heart size is normal. IMPRESSION: Near complete resolution of left pleural effusion. No pneumothorax visualized. Persistent left retrocardiac opacity. Electronically Signed   By: Marlaine Hind M.D.   On: 10/08/2019 08:01   Dg Chest 2 View  Result Date: 10/02/2019 CLINICAL DATA:  Follow-up pleural effusion, COPD, pulmonary nodule, hypertension, history non-small cell lung cancer EXAM: CHEST - 2 VIEW COMPARISON:  11/11/2018 FINDINGS: RIGHT jugular Port-A-Cath with tip projecting over SVC. Normal heart size, mediastinal contours, and pulmonary vascularity. Atherosclerotic calcification aorta. Small LEFT pleural effusion. New LEFT lower lobe opacity favor pneumonia though underlying abnormalities are not excluded. Atelectasis at both lung bases. Upper lungs clear. No pneumothorax or acute osseous findings. IMPRESSION: Small LEFT pleural effusion with new LEFT lower lobe opacity favoring pneumonia. Followup PA and lateral chest X-ray is recommended in 3-4 weeks following trial of antibiotic therapy to ensure resolution and exclude underlying malignancy. Electronically Signed   By: Lavonia Dana M.D.   On: 10/02/2019 07:57     ASSESSMENT/PLAN:  This is a very pleasant 78 year old Caucasian male diagnosed with stage IIIb non-small cell lung cancer, adenocarcinoma of the left lower lobe. He was diagnosed in January 2020. He has no actionable mutations.  The patient underwent  concurrent chemoradiation with carboplatin for an AUC of 2 and paclitaxel 45 mg/m. He is status post 5 cycles. He was unable to proceed with cycle #6 secondary to thrombocytopenia.   The patient is currently undergoing consolidation immunotherapy with Imfinzi 10 mg/kg IV every 2 weeks. Status post14cycles.He has been tolerating treatment well without any adverse effects.   Labs were reviewed. Recommend that he proceed with cycle #15 today as scheduled.   The patient will continue to follow closely with pulmonology for his shortness of breath.   The patient was advised to call immediately if he has any concerning symptoms in the interval. The patient voices understanding of current disease status and treatment options and is in agreement with the current care plan. All questions were answered. The patient knows to call the clinic with any problems, questions or concerns. We can certainly see the patient much sooner if necessary  No orders of the defined types were placed in this encounter.  Rockwell Zentz L Satonya Lux, PA-C 10/08/19

## 2019-10-08 NOTE — Procedures (Addendum)
Thoracentesis Procedure Note  Pre-operative Diagnosis: Left Pleural effusion  Post-operative Diagnosis: same  Indications: Undiagnosed effusion  Procedure Details  Consent: Informed consent was obtained. Risks of the procedure were discussed including: infection, bleeding, pain, pneumothorax.  Under sterile conditions the patient was positioned. Betadine solution and sterile drapes were utilized.  Ultrasound used to identify fluid pocket.  1% plain lidocaine was used to anesthetize the 8th rib space. Fluid was obtained without any difficulties and minimal blood loss.  A dressing was applied to the wound and wound care instructions were provided.   Findings 500 ml of clear pleural fluid was obtained. A sample was sent to Pathology for cytogenetics, flow, and cell counts, as well as for infection analysis.  Complications:  None; patient tolerated the procedure well.          Condition: stable  CXR pending  Attending Attestation: I was present and scrubbed for the entire procedure.

## 2019-10-08 NOTE — Progress Notes (Signed)
Thoracentesis Done Left Pleural Effusion Vtial signs stable  BP 123/52   RR 16  HR 91 oxygen saturations  99% on 2 LPM   [place]  Pre procedure BP 116/50    RR 16   HR 94  Oxygen saturation  99% on 2 LPM  [place] Post procedure Chest X-ray done post procedure

## 2019-10-08 NOTE — Patient Instructions (Signed)

## 2019-10-09 ENCOUNTER — Telehealth: Payer: Self-pay | Admitting: Internal Medicine

## 2019-10-09 LAB — CYTOLOGY - NON PAP

## 2019-10-09 LAB — PH, BODY FLUID: pH, Body Fluid: 7.8

## 2019-10-09 NOTE — Telephone Encounter (Signed)
Scheduled per los. Called and left msg. Mailed printout  °

## 2019-10-10 ENCOUNTER — Telehealth: Payer: Self-pay | Admitting: Pulmonary Disease

## 2019-10-10 ENCOUNTER — Telehealth: Payer: Self-pay | Admitting: Cardiology

## 2019-10-10 MED ORDER — BREZTRI AEROSPHERE 160-9-4.8 MCG/ACT IN AERO: 2.0000 | INHALATION_SPRAY | Freq: Two times a day (BID) | RESPIRATORY_TRACT | 5 refills | Status: AC

## 2019-10-10 NOTE — Telephone Encounter (Signed)
Error. No message needed

## 2019-10-10 NOTE — Telephone Encounter (Signed)
Called and spoke with Patient.  Patient stated he stopped Trelegy and started [employer].  Patient stated he has noticed improved urinary retention.  Patient stated he would like prescription for [facility] sent to Kristopher Oppenheim at [place].  Prescription sent. Nothing further at this time.

## 2019-10-11 LAB — BODY FLUID CULTURE: Culture: NO GROWTH

## 2019-10-14 DIAGNOSIS — I313 Pericardial effusion (noninflammatory): Secondary | ICD-10-CM

## 2019-10-14 DIAGNOSIS — I350 Nonrheumatic aortic (valve) stenosis: Secondary | ICD-10-CM

## 2019-10-14 DIAGNOSIS — I3139 Other pericardial effusion (noninflammatory): Secondary | ICD-10-CM

## 2019-10-14 HISTORY — DX: Pericardial effusion (noninflammatory): I31.3

## 2019-10-14 HISTORY — DX: Nonrheumatic aortic (valve) stenosis: I35.0

## 2019-10-14 HISTORY — DX: Other pericardial effusion (noninflammatory): I31.39

## 2019-10-14 NOTE — Progress Notes (Addendum)
Cardiology Office Note   Date:  10/15/2019   ID:  Robert Dixon, DOB 1941/07/01, MRN [id number]  PCP:  Robert Arabian, MD  Cardiologist:   No primary care provider on file. Referring:  Robert Arabian, MD  Chief Complaint  Patient presents with  . Aortic Stenosis      History of Present Illness: Robert Dixon is a 78 y.o. male who is referred by Robert Arabian, MD for evaluation of aortic stenosis.  He has non-small cell lung cancer.  He has been treated with carboplatin and paclitaxel.  He is on Imfinzi.  He has O2 dependent lung disease and AC emphysema reported.  He has had a thoracentesis that looked to be inflammatory.  He presents today because he was found to have pericardial effusion and aortic stenosis on an echocardiogram.  He is a well-preserved ejection fraction.  The effusion appears to be moderate.  His aortic stenosis looks to be severe with a mean gradient of 37.8.  There is also listed moderate aortic regurgitation.  The patient never had any cardiac history prior to this.  There may have been some cardiac evaluation some years ago he is not sure why and he does not really know the details.  As mentioned he has had chronic shortness of breath.  He gets around minimally in his house.  He short of breath with mild activity but denies any resting shortness of breath, PND or orthopnea.  He said no presyncope or syncope.  He has had no palpitations, chest pressure, neck or arm discomfort   Past Medical History:  Diagnosis Date  . Cancer ([place])    skin-   . Complication of anesthesia   . COPD (chronic obstructive pulmonary disease) ([place])   . Dyspnea   . GERD (gastroesophageal reflux disease)   . Hemoptysis 11/20/2018  . History of hiatal hernia   . Hypertension   . Hypoxia 11/20/2018  . Legionnaire's disease ([place]) 2014  . Migraine with visual aura   . Non-small cell lung cancer ([place]) dx'd 11/20/18  . Pneumonia 11/20/2018  . PONV (postoperative nausea and  vomiting)   . Pulmonary nodule 11/20/2018    Past Surgical History:  Procedure Laterality Date  . COLONOSCOPY W/ POLYPECTOMY    . IR IMAGING GUIDED PORT INSERTION  03/14/2019  . TONSILLECTOMY    . VASECTOMY    . VIDEO BRONCHOSCOPY WITH ENDOBRONCHIAL ULTRASOUND N/A 12/06/2018   Procedure: VIDEO BRONCHOSCOPY WITH ENDOBRONCHIAL ULTRASOUND;  Surgeon: Garner Nash, DO;  Location: [facility] OR;  Service: Thoracic;  Laterality: N/A;     Current Outpatient Medications  Medication Sig Dispense Refill  . albuterol (PROVENTIL HFA;VENTOLIN HFA) 108 (90 Base) MCG/ACT inhaler Inhale 2 puffs into the lungs every 6 (six) hours as needed for wheezing or shortness of breath. 1 Inhaler 6  . albuterol (PROVENTIL) (2.5 MG/3ML) 0.083% nebulizer solution Take 3 mLs (2.5 mg total) by nebulization every 4 (four) hours. 360 mL 0  . benazepril (LOTENSIN) 10 MG tablet Take 10 mg by mouth daily.    . Budeson-Glycopyrrol-Formoterol (BREZTRI AEROSPHERE) 160-9-4.8 MCG/ACT AERO Inhale 2 puffs into the lungs every 12 (twelve) hours. 10.7 g 5  . Cyanocobalamin (VITAMIN B 12 PO) Take 1 capsule by mouth daily. 2500 mcg    . dimenhyDRINATE (DRAMAMINE) 50 MG tablet Take 50 mg by mouth every 8 (eight) hours as needed for dizziness.     Hunt Oris (IMFINZI IV) Inject into the vein every 14 (fourteen) days.    Marland Kitchen  lidocaine-prilocaine (EMLA) cream Apply 1 application topically as needed. 30 g 0  . Multiple Vitamin (MULTIVITAMIN) tablet Take 1 tablet by mouth daily.    . pantoprazole (PROTONIX) 40 MG tablet Take 40 mg by mouth 2 (two) times daily.    . polycarbophil (FIBERCON) 625 MG tablet Take 625 mg by mouth 2 (two) times daily.     . prochlorperazine (COMPAZINE) 10 MG tablet TAKE ONE TABLET BY MOUTH EVERY 6 HOURS AS NEEDED FOR NAUSEA OR VOMITING 30 tablet 0  . traMADol (ULTRAM) 50 MG tablet Take 50 mg by mouth 2 (two) times daily as needed.     No current facility-administered medications for this visit.     Facility-Administered Medications Ordered in Other Visits  Medication Dose Route Frequency Provider Last Rate Last Dose  . sodium chloride flush (NS) 0.9 % injection 10 mL  10 mL Intracatheter PRN [name], MD      . sodium chloride flush (NS) 0.9 % injection 10 mL  10 mL Intracatheter PRN [name], MD   10 mL at 05/20/19 1232    Allergies:   Patient has no known allergies.    Social History:  The patient  reports that he quit smoking about 20 years ago. His smoking use included cigarettes. He quit after 30.00 years of use. He has never used smokeless tobacco. He reports that he does not drink alcohol or use drugs.   Family History:  The patient's family history includes CVA in his father; Cancer - Cervical in his sister; Cancer - Lung in his mother; Hypertension in his father.    ROS:  Please see the history of present illness.   Otherwise, review of systems are positive for none.   All other systems are reviewed and negative.    PHYSICAL EXAM: VS:  BP 97/60 (BP Location: Right Arm)   Pulse (!) 103   Temp (!) 97.3 F (36.3 C)   Ht 5' 8.5" (1.74 m)   Wt 153 lb (69.4 kg)   SpO2 98%   BMI 22.93 kg/m  , BMI Body mass index is 22.93 kg/m. GENERAL: Frail appearing HEENT:  Pupils equal round and reactive, fundi not visualized, oral mucosa unremarkable NECK:  No jugular venous distention, waveform within normal limits, carotid upstroke brisk and symmetric, no bruits, no thyromegaly LYMPHATICS:  No cervical, inguinal adenopathy LUNGS: Decreased breath sounds bilaterally with few expiratory wheezes BACK:  No CVA tenderness CHEST:  Unremarkable HEART:  PMI not displaced or sustained,S1 and S2 within normal limits, no S3, no S4, no clicks, no rubs, 3 out of 6 honking mid-to-late peaking systolic murmur radiating out aortic outflow tract, no diastolic murmurs ABD:  Flat, positive bowel sounds normal in frequency in pitch, no bruits, no rebound, no guarding, possible midline  pulsatile mass, no hepatomegaly, no splenomegaly EXT:  2 plus pulses throughout, no edema, no cyanosis no clubbing SKIN:  No rashes no nodules NEURO:  Cranial nerves II through XII grossly intact, motor grossly intact throughout PSYCH:  Cognitively intact, oriented to person place and time    EKG:  EKG is not ordered today. The ekg ordered 12/06/2018 demonstrates sinus rhythm, rate 82, axis within normal limits, intervals within normal limits, poor anterior R wave progression.   Recent Labs: 10/08/2019: ALT 10; BUN 18; Creatinine 1.12; Hemoglobin 9.3; Platelet Count 238; Potassium 4.1; Sodium 140; TSH 1.184    Lipid Panel No results found for: CHOL, TRIG, HDL, CHOLHDL, VLDL, LDLCALC, LDLDIRECT    Wt Readings from  Last 3 Encounters:  10/15/19 153 lb (69.4 kg)  10/08/19 154 lb 11.2 oz (70.2 kg)  10/01/19 158 lb 12.8 oz (72 kg)      Other studies Reviewed: Additional studies/ records that were reviewed today include:   I personally reviewed the echo images for this appt.  . Review of the above records demonstrates:  Please see elsewhere in the note.     ASSESSMENT AND PLAN:  AS: He has a heavily calcified aortic valve and I did review the images for this visit.  It is difficult to sort out symptoms.  I had a long conversation with the patient and his son about this.  My plan for now is to follow-up with another echocardiogram for evaluation of the effusion unless he has progressive symptoms between now and then.  If he has had progressive decline in his respiratory status and his cancer is stable as reported and there is no obvious acute pulmonary process we might need a right and left heart catheterization to further assess.  He certainly would not be a SAVR candidate but could be possibly a TAVR candidate in the future.  PERICARDIAL EFFUSION:   This was moderate.  I will follow this up in 2 months.  I suspect this is inflammatory.  There is no evidence of pulses on exam or tamponade  physiology by echo.  HTN: Blood pressures running low.  No change in therapy.  AAA: I like to screen him with an ultrasound given his past smoking history and questionable pulsatile aorta.   Current medicines are reviewed at length with the patient today.  The patient does not have concerns regarding medicines.  The following changes have been made:  no change  Labs/ tests ordered today include: None  Orders Placed This Encounter  Procedures  . ECHOCARDIOGRAM COMPLETE     Disposition:   FU with me in two months.     Signed, Minus Breeding, MD  10/15/2019 1:15 PM    [facility]

## 2019-10-15 ENCOUNTER — Other Ambulatory Visit: Payer: Self-pay

## 2019-10-15 ENCOUNTER — Encounter: Payer: Self-pay | Admitting: Cardiology

## 2019-10-15 ENCOUNTER — Ambulatory Visit (INDEPENDENT_AMBULATORY_CARE_PROVIDER_SITE_OTHER): Payer: Medicare HMO | Admitting: Cardiology

## 2019-10-15 VITALS — BP 97/60 | HR 103 | Temp 97.3°F | Ht 68.5 in | Wt 153.0 lb

## 2019-10-15 DIAGNOSIS — I313 Pericardial effusion (noninflammatory): Secondary | ICD-10-CM

## 2019-10-15 DIAGNOSIS — I10 Essential (primary) hypertension: Secondary | ICD-10-CM

## 2019-10-15 DIAGNOSIS — I3139 Other pericardial effusion (noninflammatory): Secondary | ICD-10-CM

## 2019-10-15 DIAGNOSIS — I350 Nonrheumatic aortic (valve) stenosis: Secondary | ICD-10-CM

## 2019-10-15 NOTE — Patient Instructions (Signed)
Medication Instructions:  Your physician recommends that you continue on your current medications as directed. Please refer to the Current Medication list given to you today.  If you need a refill on your cardiac medications before your next appointment, please call your pharmacy.   Lab work: NONE  Testing/Procedures: Your physician has requested that you have an echocardiogram in 2 months. Echocardiography is a painless test that uses sound waves to create images of your heart. It provides your doctor with information about the size and shape of your heart and how well your heart's chambers and valves are working. This procedure takes approximately one hour. There are no restrictions for this procedure. [place] [facility]  Follow-Up: At [facility], you and your health needs are our priority.  As part of our continuing mission to provide you with exceptional heart care, we have created designated Provider Care Teams.  These Care Teams include your primary Cardiologist (physician) and Advanced Practice Providers (APPs -  Physician Assistants and Nurse Practitioners) who all work together to provide you with the care you need, when you need it. You may see Dr Percival Spanish or one of the following Advanced Practice Providers on your designated Care Team:    Rosaria Ferries, PA-C  Jory Sims, DNP, ANP  Cadence Kathlen Mody, NP  Your physician wants you to follow-up in: 2 months after your Echo.

## 2019-10-21 ENCOUNTER — Inpatient Hospital Stay: Payer: Medicare HMO

## 2019-10-21 ENCOUNTER — Encounter: Payer: Self-pay | Admitting: Physician Assistant

## 2019-10-21 ENCOUNTER — Inpatient Hospital Stay (HOSPITAL_BASED_OUTPATIENT_CLINIC_OR_DEPARTMENT_OTHER): Payer: Medicare HMO | Admitting: Physician Assistant

## 2019-10-21 ENCOUNTER — Other Ambulatory Visit: Payer: Self-pay

## 2019-10-21 VITALS — BP 105/48 | HR 92 | Temp 98.3°F | Resp 17 | Ht 68.0 in | Wt 157.7 lb

## 2019-10-21 DIAGNOSIS — C3492 Malignant neoplasm of unspecified part of left bronchus or lung: Secondary | ICD-10-CM

## 2019-10-21 DIAGNOSIS — M549 Dorsalgia, unspecified: Secondary | ICD-10-CM | POA: Diagnosis not present

## 2019-10-21 DIAGNOSIS — Z5112 Encounter for antineoplastic immunotherapy: Secondary | ICD-10-CM | POA: Diagnosis not present

## 2019-10-21 DIAGNOSIS — D6959 Other secondary thrombocytopenia: Secondary | ICD-10-CM | POA: Diagnosis not present

## 2019-10-21 DIAGNOSIS — J449 Chronic obstructive pulmonary disease, unspecified: Secondary | ICD-10-CM | POA: Diagnosis not present

## 2019-10-21 DIAGNOSIS — Z95828 Presence of other vascular implants and grafts: Secondary | ICD-10-CM

## 2019-10-21 DIAGNOSIS — C3432 Malignant neoplasm of lower lobe, left bronchus or lung: Secondary | ICD-10-CM | POA: Diagnosis not present

## 2019-10-21 DIAGNOSIS — Z923 Personal history of irradiation: Secondary | ICD-10-CM | POA: Diagnosis not present

## 2019-10-21 DIAGNOSIS — Z9221 Personal history of antineoplastic chemotherapy: Secondary | ICD-10-CM | POA: Diagnosis not present

## 2019-10-21 DIAGNOSIS — Z9981 Dependence on supplemental oxygen: Secondary | ICD-10-CM | POA: Diagnosis not present

## 2019-10-21 DIAGNOSIS — R5383 Other fatigue: Secondary | ICD-10-CM | POA: Diagnosis not present

## 2019-10-21 LAB — CBC WITH DIFFERENTIAL (CANCER CENTER ONLY)
Abs Immature Granulocytes: 0.01 10*3/uL (ref 0.00–0.07)
Basophils Absolute: 0.1 10*3/uL (ref 0.0–0.1)
Basophils Relative: 2 %
Eosinophils Absolute: 0.1 10*3/uL (ref 0.0–0.5)
Eosinophils Relative: 2 %
HCT: 28.2 % — ABNORMAL LOW (ref 39.0–52.0)
Hemoglobin: 9.3 g/dL — ABNORMAL LOW (ref 13.0–17.0)
Immature Granulocytes: 0 %
Lymphocytes Relative: 11 %
Lymphs Abs: 0.5 10*3/uL — ABNORMAL LOW (ref 0.7–4.0)
MCH: 40.6 pg — ABNORMAL HIGH (ref 26.0–34.0)
MCHC: 33 g/dL (ref 30.0–36.0)
MCV: 123.1 fL — ABNORMAL HIGH (ref 80.0–100.0)
Monocytes Absolute: 0.4 10*3/uL (ref 0.1–1.0)
Monocytes Relative: 10 %
Neutro Abs: 3.1 10*3/uL (ref 1.7–7.7)
Neutrophils Relative %: 75 %
Platelet Count: 219 10*3/uL (ref 150–400)
RBC: 2.29 MIL/uL — ABNORMAL LOW (ref 4.22–5.81)
RDW: 14.6 % (ref 11.5–15.5)
WBC Count: 4.1 10*3/uL (ref 4.0–10.5)
nRBC: 0 % (ref 0.0–0.2)

## 2019-10-21 LAB — CMP (CANCER CENTER ONLY)
ALT: 12 U/L (ref 0–44)
AST: 15 U/L (ref 15–41)
Albumin: 3.7 g/dL (ref 3.5–5.0)
Alkaline Phosphatase: 94 U/L (ref 38–126)
Anion gap: 8 (ref 5–15)
BUN: 12 mg/dL (ref 8–23)
CO2: 23 mmol/L (ref 22–32)
Calcium: 8.5 mg/dL — ABNORMAL LOW (ref 8.9–10.3)
Chloride: 110 mmol/L (ref 98–111)
Creatinine: 0.95 mg/dL (ref 0.61–1.24)
GFR, Est AFR Am: >60 mL/min (ref >60)
GFR, Estimated: >60 mL/min (ref >60)
Glucose, Bld: 97 mg/dL (ref 70–99)
Potassium: 4.2 mmol/L (ref 3.5–5.1)
Sodium: 141 mmol/L (ref 135–145)
Total Bilirubin: 0.9 mg/dL (ref 0.3–1.2)
Total Protein: 5.8 g/dL — ABNORMAL LOW (ref 6.5–8.1)

## 2019-10-21 MED ORDER — HEPARIN SOD (PORK) LOCK FLUSH 100 UNIT/ML IV SOLN
500.0000 [IU] | Freq: Once | INTRAVENOUS | Status: AC | PRN
  Administered 2019-10-21: 500 [IU]
  Filled 2019-10-21: qty 5

## 2019-10-21 MED ORDER — SODIUM CHLORIDE 0.9 % IV SOLN
740.0000 mg | Freq: Once | INTRAVENOUS | Status: AC
  Administered 2019-10-21: 740 mg via INTRAVENOUS
  Filled 2019-10-21: qty 10

## 2019-10-21 MED ORDER — SODIUM CHLORIDE 0.9% FLUSH
10.0000 mL | INTRAVENOUS | Status: DC | PRN
  Administered 2019-10-21: 10 mL
  Filled 2019-10-21: qty 10

## 2019-10-21 MED ORDER — SODIUM CHLORIDE 0.9 % IV SOLN
Freq: Once | INTRAVENOUS | Status: AC
  Administered 2019-10-21: 12:00:00 via INTRAVENOUS
  Filled 2019-10-21: qty 250

## 2019-10-21 NOTE — Patient Instructions (Signed)

## 2019-10-21 NOTE — Progress Notes (Signed)
Nutrition Assessment   Reason for Assessment:   Patient identified on Malnutrition Screening report for poor appetite and weight loss   ASSESSMENT:  78 year old male with non small cell lung cancer.  Followed by Dr. Earlie Server.  Past medical history of COPD, GERD, HTN, pneumonia, pulmonary nodule. Patient receiving imfinzi.   Met with patient during infusion.  Patient reports that his appetite is poor.  Reports that his shortness of breath does not let him do to much cooking.  He prepares meals most of the time. Family does bring him food at times as well.  Mostly eats canned soups, frozen meals.  Has mostly been eating vegetable beef soup, chicken noodle soup and potato soup.  Sometimes drinks boost shakes but upsets stomach.  Makes smoothies (yogurt, fruit, coconut water) a couple of times per week.      Nutrition Focused Physical Exam: deferred   Medications: Vit B 12, MVI, protonix, compazine.    Labs: glucose 149   Anthropometrics:   Height: 68.5 inches Weight: 157 lb today Noted 166 lb on 10/13 BMI: 23  5% weight loss in the last month, significant   Estimated Energy Needs  Kcals: 2100-2400 Protein: 105-120 g Fluid: > 2 L   NUTRITION DIAGNOSIS: Inadequate oral intake related to cancer, shortness of breath, COPD as evidenced by 5% weight loss and poor appetite   INTERVENTION:  Discussed strategies to increase calories and protein. Handout provided   Discussed easy to prepare meals Provided smoothie recipes for patient to try.  Contact information provided    MONITORING, EVALUATION, GOAL: Patient will consume adequate calories and protein to meet nutritional needs   Next Visit: December 22 during infusion  Sherrey North B. Zenia Resides, [place], [place] Registered Dietitian [phone] (pager)

## 2019-10-21 NOTE — Progress Notes (Signed)
[facility] OFFICE PROGRESS NOTE  [name], MD [place] [address] [place] [address]  DIAGNOSIS: Stage IIIB(T1b, N3, M0) non-small cell lung cancer favoring adenocarcinoma diagnosed in January 2020 and presented with left lower lobe pulmonary nodule in addition to bilateral hilar and subcarinal lymphadenopathy  PRIOR THERAPY: Concurrent chemoradiation with weekly carboplatin for an AUC of 2 and paclitaxel 45 mg/m2. Status post 5 cycles.  CURRENT THERAPY: Consolidation immunotherapy with Imfinzi 10 mg/kg IV every 2 weeks. First dose March 25, 2019.Status post15cycles.  INTERVAL HISTORY: Robert Dixon 78 y.o. male returns to the clinic for a follow-up visit.  The patient is feeling fair today his main concern is related to his back pain from his compression fracture.  His primary care provider has prescribed him Ultram for this concern which has not completely controlled his pain. He takes this twice a day.   Regarding his treatment with immunotherapy, he is tolerating Imfinzi well without any concerning adverse side effects except for fatigue 1-2 days following treatment.  He denies any recent fever, chills, or night sweats.  He is scheduled to meet with the cancer centers nutritionist today while in infusion.  He reports his baseline shortness of breath with exertion which is likely multifactorial secondary to COPD, aortic stenosis, radiation-induced fibrosis, and cancer.  He is followed closely by pulmonology.  He also recently had an appointment with a cardiologist.  He denies any nausea, vomiting, diarrhea, or constipation.  He denies any headache or visual changes.  He denies any rashes or skin changes.  He is here today for evaluation before starting cycle #16.   MEDICAL HISTORY: Past Medical History:  Diagnosis Date  . Cancer ([facility])    skin-   . Complication of anesthesia   . COPD (chronic obstructive pulmonary disease) ([facility])   . Dyspnea   . GERD  (gastroesophageal reflux disease)   . Hemoptysis 11/20/2018  . History of hiatal hernia   . Hypertension   . Hypoxia 11/20/2018  . 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 Migraine with visual aura   . Non-small cell lung cancer ([facility]) dx'd 11/20/18  . Pneumonia 11/20/2018  . PONV (postoperative nausea and vomiting)   . Pulmonary nodule 11/20/2018    ALLERGIES:  has No Known Allergies.  MEDICATIONS:  Current Outpatient Medications  Medication Sig Dispense Refill  . albuterol (PROVENTIL HFA;VENTOLIN HFA) 108 (90 Base) MCG/ACT inhaler Inhale 2 puffs into the lungs every 6 (six) hours as needed for wheezing or shortness of breath. 1 Inhaler 6  . albuterol (PROVENTIL) (2.5 MG/3ML) 0.083% nebulizer solution Take 3 mLs (2.5 mg total) by nebulization every 4 (four) hours. 360 mL 0  . benazepril (LOTENSIN) 10 MG tablet Take 10 mg by mouth daily.    . Budeson-Glycopyrrol-Formoterol (BREZTRI AEROSPHERE) 160-9-4.8 MCG/ACT AERO Inhale 2 puffs into the lungs every 12 (twelve) hours. 10.7 g 5  . Cyanocobalamin (VITAMIN B 12 PO) Take 1 capsule by mouth daily. 2500 mcg    . dimenhyDRINATE (DRAMAMINE) 50 MG tablet Take 50 mg by mouth every 8 (eight) hours as needed for dizziness.     [place] Oris (IMFINZI IV) Inject into the vein every 14 (fourteen) days.    Marland Kitchen lidocaine-prilocaine (EMLA) cream Apply 1 application topically as needed. 30 g 0  . Multiple Vitamin (MULTIVITAMIN) tablet Take 1 tablet by mouth daily.    . pantoprazole (PROTONIX) 40 MG tablet Take 40 mg by mouth 2 (two) times daily.    . polycarbophil (  FIBERCON) 625 MG tablet Take 625 mg by mouth 2 (two) times daily.     . prochlorperazine (COMPAZINE) 10 MG tablet TAKE ONE TABLET BY MOUTH EVERY 6 HOURS AS NEEDED FOR NAUSEA OR VOMITING 30 tablet 0  . traMADol (ULTRAM) 50 MG tablet Take 50 mg by mouth 2 (two) times daily as needed.     No current facility-administered medications for this visit.    Facility-Administered Medications Ordered in  Other Visits  Medication Dose Route Frequency Provider Last Rate Last Dose  . sodium chloride flush (NS) 0.9 % injection 10 mL  10 mL Intracatheter PRN [name], MD      . sodium chloride flush (NS) 0.9 % injection 10 mL  10 mL Intracatheter PRN [name], MD   10 mL at 05/20/19 1232    SURGICAL HISTORY:  Past Surgical History:  Procedure Laterality Date  . COLONOSCOPY W/ POLYPECTOMY    . IR IMAGING GUIDED PORT INSERTION  03/14/2019  . TONSILLECTOMY    . VASECTOMY    . VIDEO BRONCHOSCOPY WITH ENDOBRONCHIAL ULTRASOUND N/A 12/06/2018   Procedure: VIDEO BRONCHOSCOPY WITH ENDOBRONCHIAL ULTRASOUND;  Surgeon: Garner Nash, DO;  Location: [facility] OR;  Service: Thoracic;  Laterality: N/A;    REVIEW OF SYSTEMS:   Review of Systems  Constitutional: Positive for fatigue. Negative for appetite change, chills, fever and unexpected weight change.  HENT: Negative for mouth sores, nosebleeds, sore throat and trouble swallowing.   Eyes: Negative for eye problems and icterus.  Respiratory: Positive for shortness of breath, Negative for cough, hemoptysis, and wheezing.   Cardiovascular: Negative for chest pain and leg swelling.  Gastrointestinal: Negative for abdominal pain, constipation, diarrhea, nausea and vomiting.  Genitourinary: Negative for bladder incontinence, difficulty urinating, dysuria, frequency and hematuria.   Musculoskeletal: Negative for back pain, gait problem, neck pain and neck stiffness.  Skin: Negative for itching and rash.  Neurological: Negative for dizziness, extremity weakness, gait problem, headaches, light-headedness and seizures.  Hematological: Negative for adenopathy. Does not bruise/bleed easily.  Psychiatric/Behavioral: Negative for confusion, depression and sleep disturbance. The patient is not nervous/anxious. nervous/anxious.     PHYSICAL EXAMINATION:  Blood pressure (!) 105/48, pulse 92, temperature 98.3 F (36.8 C), temperature source Temporal, resp.  rate 17, height 5\' 8"  (1.727 m), weight 157 lb 11.2 oz (71.5 kg), SpO2 95 %.  ECOG PERFORMANCE STATUS: 1 - Symptomatic but completely ambulatory  Physical Exam  Constitutional: Oriented to person, place, and time and elderly appearing male and in no distress.  HENT:  Head: Normocephalic and atraumatic.  Mouth/Throat: Oropharynx is clear and moist. No oropharyngeal exudate.  Eyes: Conjunctivae are normal. Right eye exhibits no discharge. Left eye exhibits no discharge. No scleral icterus.  Neck: Normal range of motion. Neck supple.  Cardiovascular: Systolic ejection murmur. Normal rate, regular rhythm, and intact distal pulses.   Pulmonary/Chest: Effort normal and breath sounds normal. No respiratory distress. No wheezes. No rales.  Abdominal: Soft. Bowel sounds are normal. Exhibits no distension and no mass. There is no tenderness.  Musculoskeletal: Normal range of motion. Exhibits no edema.  Lymphadenopathy:    No cervical adenopathy.  Neurological: Alert and oriented to person, place, and time. Exhibits normal muscle tone. Gait normal. Coordination normal.  Skin: Skin is warm and dry. No rash noted. Not diaphoretic. No erythema. No pallor.  Psychiatric: Mood, memory and judgment normal.  Vitals reviewed.  LABORATORY DATA: Lab Results  Component Value Date   WBC 4.1 10/21/2019   HGB 9.3 (L) 10/21/2019  HCT 28.2 (L) 10/21/2019   MCV 123.1 (H) 10/21/2019   PLT 219 10/21/2019      Chemistry      Component Value Date/Time   NA 141 10/21/2019 0945   K 4.2 10/21/2019 0945   CL 110 10/21/2019 0945   CO2 23 10/21/2019 0945   BUN 12 10/21/2019 0945   CREATININE 0.95 10/21/2019 0945      Component Value Date/Time   CALCIUM 8.5 (L) 10/21/2019 0945   ALKPHOS 94 10/21/2019 0945   AST 15 10/21/2019 0945   ALT 12 10/21/2019 0945   BILITOT 0.9 10/21/2019 0945       RADIOGRAPHIC STUDIES:  Dg Chest 1 View  Result Date: 10/08/2019 CLINICAL DATA:  Left pleural effusion. Status  post thoracentesis. EXAM: CHEST  1 VIEW COMPARISON:  10/01/2019 FINDINGS: Left pleural effusion has nearly completely resolved since previous study. There is persistent opacity in the left retrocardiac lung base. No pneumothorax is visualized. Right lung remains clear. Right-sided power port remains in appropriate position. Heart size is normal. IMPRESSION: Near complete resolution of left pleural effusion. No pneumothorax visualized. Persistent left retrocardiac opacity. Electronically Signed   By: Marlaine Hind M.D.   On: 10/08/2019 08:01   Dg Chest 2 View  Result Date: 10/02/2019 CLINICAL DATA:  Follow-up pleural effusion, COPD, pulmonary nodule, hypertension, history non-small cell lung cancer EXAM: CHEST - 2 VIEW COMPARISON:  11/11/2018 FINDINGS: RIGHT jugular Port-A-Cath with tip projecting over SVC. Normal heart size, mediastinal contours, and pulmonary vascularity. Atherosclerotic calcification aorta. Small LEFT pleural effusion. New LEFT lower lobe opacity favor pneumonia though underlying abnormalities are not excluded. Atelectasis at both lung bases. Upper lungs clear. No pneumothorax or acute osseous findings. IMPRESSION: Small LEFT pleural effusion with new LEFT lower lobe opacity favoring pneumonia. Followup PA and lateral chest X-ray is recommended in 3-4 weeks following trial of antibiotic therapy to ensure resolution and exclude underlying malignancy. Electronically Signed   By: Lavonia Dana M.D.   On: 10/02/2019 07:57     ASSESSMENT/PLAN:  This is a very pleasant 78 year old Caucasian male diagnosed with stage IIIb non-small cell lung cancer, adenocarcinoma of the left lower lobe. He was diagnosed in January 2020. He has no actionable mutations.  The patient underwent concurrent chemoradiation with carboplatin for an AUC of 2 and paclitaxel 45 mg/m. He is status post 5 cycles. He was unable to proceed with cycle #6 secondary to thrombocytopenia.   The patient is currently  undergoing consolidation immunotherapy with Imfinzi 10 mg/kg IV every 2 weeks. Status post15cycles.He has been tolerating treatment well without any adverse effects.  The patient was seen with Dr. Julien Nordmann today. Labs were reviewed. Dr. Julien Nordmann recommends that the patient continue with cycle #16 today as scheduled.   We will see the patient back for a follow up visit in 2 weeks for evaluation before starting cycle #17.   He will continue to use ultram for his pain. He was advised to try to take tylenol in between doses for better control of his pain. If he has any new or worsening pain, he was instructed to call us or his PCP.   He will continue to follow closely with pulmonology and cardiology regarding his COPD and aortic stenosis.   The patient was advised to call immediately if he has any concerning symptoms in the interval. The patient voices understanding of current disease status and treatment options and is in agreement with the current care plan. All questions were answered. The patient knows to  call the clinic with any problems, questions or concerns. We can certainly see the patient much sooner if necessary   No orders of the defined types were placed in this encounter.    Mayank Teuscher L Lajeana Strough, PA-C 10/21/19  ADDENDUM: Hematology/Oncology Attending: I had a face-to-face encounter with the patient today.  I recommended his care plan.  This is a very pleasant 78 years old white male with stage IIIb non-small cell lung cancer, adenocarcinoma diagnosed in January 2020 with no actionable mutations.  The patient is status post a course of concurrent chemoradiation with weekly carboplatin and paclitaxel and he is currently undergoing consolidation treatment with immunotherapy every 2 weeks is status post 15 cycles.  He has been tolerating this treatment well with no concerning adverse effects. I recommended for the patient to proceed with cycle #16 today as planned. For the COPD  he will continue with his current inhalers as prescribed by his pulmonologist. For the back pain he is currently on Ultram. The patient will come back for follow-up visit in 2 weeks for evaluation before the next cycle of his treatment. He was advised to call immediately if he has any concerning symptoms in the interval.  Disclaimer: This note was dictated with voice recognition software. Similar sounding words can inadvertently be transcribed and may be missed upon review. Eilleen Kempf, MD 10/21/19

## 2019-10-21 NOTE — Patient Instructions (Signed)
[facility] Discharge Instructions for Patients Receiving Chemotherapy  Today you received the following chemotherapy agents Durvalumab (IMFINZI).  To help prevent nausea and vomiting after your treatment, we encourage you to take your nausea medication as prescribed.   If you develop nausea and vomiting that is not controlled by your nausea medication, call the clinic.   BELOW ARE SYMPTOMS THAT SHOULD BE REPORTED IMMEDIATELY:  *FEVER GREATER THAN 100.5 F  *CHILLS WITH OR WITHOUT FEVER  NAUSEA AND VOMITING THAT IS NOT CONTROLLED WITH YOUR NAUSEA MEDICATION  *UNUSUAL SHORTNESS OF BREATH  *UNUSUAL BRUISING OR BLEEDING  TENDERNESS IN MOUTH AND THROAT WITH OR WITHOUT PRESENCE OF ULCERS  *URINARY PROBLEMS  *BOWEL PROBLEMS  UNUSUAL RASH Items with * indicate a potential emergency and should be followed up as soon as possible.  Feel free to call the clinic should you have any questions or concerns. The clinic phone number is [phone].  Please show the [facility] at check-in to the Emergency Department and triage nurse.  Coronavirus (COVID-19) Are you at risk?  Are you at risk for the Coronavirus (COVID-19)?  To be considered HIGH RISK for Coronavirus (COVID-19), you have to meet the following criteria:  . Traveled to [place], [place], [place], [place] or [place]; or in the [place] to [place], [place], [place], or [place]; and have fever, cough, and shortness of breath within the last 2 weeks of travel OR . Been in close contact with a person diagnosed with COVID-19 within the last 2 weeks and have fever, cough, and shortness of breath . IF YOU DO NOT MEET THESE CRITERIA, YOU ARE CONSIDERED LOW RISK FOR COVID-19.  What to do if you are HIGH RISK for COVID-19?  Marland Kitchen If you are having a medical emergency, call 911. . Seek medical care right away. Before you go to a doctor's office, urgent care or emergency department, call ahead and tell them  about your recent travel, contact with someone diagnosed with COVID-19, and your symptoms. You should receive instructions from your physician's office regarding next steps of care.  . When you arrive at healthcare provider, tell the healthcare staff immediately you have returned from visiting [place], [place], [place], [place] or [place]; or traveled in the [place] to [place], [place], [place], or [place]; in the last two weeks or you have been in close contact with a person diagnosed with COVID-19 in the last 2 weeks.   . Tell the health care staff about your symptoms: fever, cough and shortness of breath. . After you have been seen by a medical provider, you will be either: o Tested for (COVID-19) and discharged home on quarantine except to seek medical care if symptoms worsen, and asked to  - Stay home and avoid contact with others until you get your results (4-5 days)  - Avoid travel on public transportation if possible (such as bus, train, or airplane) or o Sent to the Emergency Department by EMS for evaluation, COVID-19 testing, and possible admission depending on your condition and test results.  What to do if you are LOW RISK for COVID-19?  Reduce your risk of any infection by using the same precautions used for avoiding the common cold or flu:  Marland Kitchen Wash your hands often with soap and warm water for at least 20 seconds.  If soap and water are not readily available, use an alcohol-based hand sanitizer with at least 60% alcohol.  . If coughing or  sneezing, cover your mouth and nose by coughing or sneezing into the elbow areas of your shirt or coat, into a tissue or into your sleeve (not your hands). . Avoid shaking hands with others and consider head nods or verbal greetings only. . Avoid touching your eyes, nose, or mouth with unwashed hands.  . Avoid close contact with people who are sick. . Avoid places or events with large numbers of people in one location, like concerts or  sporting events. . Carefully consider travel plans you have or are making. . If you are planning any travel outside or inside the [place], visit the CDC's Travelers' Health webpage for the latest health notices. . If you have some symptoms but not all symptoms, continue to monitor at home and seek medical attention if your symptoms worsen. . If you are having a medical emergency, call 911.   [facility] / e-Visit: [email or web address]         [facility]: [facility]: [phone]                   [facility]: [phone]

## 2019-10-22 ENCOUNTER — Telehealth: Payer: Self-pay | Admitting: Internal Medicine

## 2019-10-22 NOTE — Telephone Encounter (Signed)
Scheduled per los. Called and left msg. Mailed printout  °

## 2019-10-29 DIAGNOSIS — J439 Emphysema, unspecified: Secondary | ICD-10-CM | POA: Diagnosis not present

## 2019-11-01 DIAGNOSIS — C3412 Malignant neoplasm of upper lobe, left bronchus or lung: Secondary | ICD-10-CM | POA: Diagnosis not present

## 2019-11-01 DIAGNOSIS — J9611 Chronic respiratory failure with hypoxia: Secondary | ICD-10-CM | POA: Diagnosis not present

## 2019-11-01 DIAGNOSIS — J439 Emphysema, unspecified: Secondary | ICD-10-CM | POA: Diagnosis not present

## 2019-11-01 DIAGNOSIS — J9600 Acute respiratory failure, unspecified whether with hypoxia or hypercapnia: Secondary | ICD-10-CM | POA: Diagnosis not present

## 2019-11-01 DIAGNOSIS — C3492 Malignant neoplasm of unspecified part of left bronchus or lung: Secondary | ICD-10-CM | POA: Diagnosis not present

## 2019-11-01 DIAGNOSIS — J432 Centrilobular emphysema: Secondary | ICD-10-CM | POA: Diagnosis not present

## 2019-11-01 DIAGNOSIS — J90 Pleural effusion, not elsewhere classified: Secondary | ICD-10-CM | POA: Diagnosis not present

## 2019-11-04 ENCOUNTER — Inpatient Hospital Stay: Payer: Medicare HMO

## 2019-11-04 ENCOUNTER — Other Ambulatory Visit: Payer: Self-pay

## 2019-11-04 ENCOUNTER — Inpatient Hospital Stay: Payer: Medicare HMO | Attending: Internal Medicine | Admitting: Internal Medicine

## 2019-11-04 ENCOUNTER — Encounter: Payer: Self-pay | Admitting: Internal Medicine

## 2019-11-04 VITALS — BP 112/48 | HR 99 | Temp 98.3°F | Resp 17 | Ht 68.0 in | Wt 157.0 lb

## 2019-11-04 DIAGNOSIS — Z923 Personal history of irradiation: Secondary | ICD-10-CM | POA: Insufficient documentation

## 2019-11-04 DIAGNOSIS — C3492 Malignant neoplasm of unspecified part of left bronchus or lung: Secondary | ICD-10-CM

## 2019-11-04 DIAGNOSIS — C3432 Malignant neoplasm of lower lobe, left bronchus or lung: Secondary | ICD-10-CM | POA: Diagnosis not present

## 2019-11-04 DIAGNOSIS — R5383 Other fatigue: Secondary | ICD-10-CM | POA: Insufficient documentation

## 2019-11-04 DIAGNOSIS — Z5112 Encounter for antineoplastic immunotherapy: Secondary | ICD-10-CM

## 2019-11-04 DIAGNOSIS — Z79899 Other long term (current) drug therapy: Secondary | ICD-10-CM | POA: Insufficient documentation

## 2019-11-04 DIAGNOSIS — J449 Chronic obstructive pulmonary disease, unspecified: Secondary | ICD-10-CM | POA: Diagnosis not present

## 2019-11-04 DIAGNOSIS — K219 Gastro-esophageal reflux disease without esophagitis: Secondary | ICD-10-CM | POA: Insufficient documentation

## 2019-11-04 DIAGNOSIS — G8929 Other chronic pain: Secondary | ICD-10-CM | POA: Diagnosis not present

## 2019-11-04 DIAGNOSIS — I350 Nonrheumatic aortic (valve) stenosis: Secondary | ICD-10-CM | POA: Diagnosis not present

## 2019-11-04 DIAGNOSIS — I10 Essential (primary) hypertension: Secondary | ICD-10-CM | POA: Insufficient documentation

## 2019-11-04 DIAGNOSIS — M545 Low back pain: Secondary | ICD-10-CM | POA: Diagnosis not present

## 2019-11-04 DIAGNOSIS — Z95828 Presence of other vascular implants and grafts: Secondary | ICD-10-CM

## 2019-11-04 DIAGNOSIS — Z9221 Personal history of antineoplastic chemotherapy: Secondary | ICD-10-CM | POA: Insufficient documentation

## 2019-11-04 LAB — CMP (CANCER CENTER ONLY)
ALT: 12 U/L (ref 0–44)
AST: 14 U/L — ABNORMAL LOW (ref 15–41)
Albumin: 3.8 g/dL (ref 3.5–5.0)
Alkaline Phosphatase: 88 U/L (ref 38–126)
Anion gap: 8 (ref 5–15)
BUN: 13 mg/dL (ref 8–23)
CO2: 23 mmol/L (ref 22–32)
Calcium: 8.5 mg/dL — ABNORMAL LOW (ref 8.9–10.3)
Chloride: 111 mmol/L (ref 98–111)
Creatinine: 0.94 mg/dL (ref 0.61–1.24)
GFR, Est AFR Am: >60 mL/min (ref >60)
GFR, Estimated: >60 mL/min (ref >60)
Glucose, Bld: 127 mg/dL — ABNORMAL HIGH (ref 70–99)
Potassium: 4.4 mmol/L (ref 3.5–5.1)
Sodium: 142 mmol/L (ref 135–145)
Total Bilirubin: 0.7 mg/dL (ref 0.3–1.2)
Total Protein: 6 g/dL — ABNORMAL LOW (ref 6.5–8.1)

## 2019-11-04 LAB — CBC WITH DIFFERENTIAL (CANCER CENTER ONLY)
Abs Immature Granulocytes: 0.02 10*3/uL (ref 0.00–0.07)
Basophils Absolute: 0.1 10*3/uL (ref 0.0–0.1)
Basophils Relative: 2 %
Eosinophils Absolute: 0 10*3/uL (ref 0.0–0.5)
Eosinophils Relative: 1 %
HCT: 28.9 % — ABNORMAL LOW (ref 39.0–52.0)
Hemoglobin: 9.6 g/dL — ABNORMAL LOW (ref 13.0–17.0)
Immature Granulocytes: 0 %
Lymphocytes Relative: 13 %
Lymphs Abs: 0.6 10*3/uL — ABNORMAL LOW (ref 0.7–4.0)
MCH: 40.5 pg — ABNORMAL HIGH (ref 26.0–34.0)
MCHC: 33.2 g/dL (ref 30.0–36.0)
MCV: 121.9 fL — ABNORMAL HIGH (ref 80.0–100.0)
Monocytes Absolute: 0.5 10*3/uL (ref 0.1–1.0)
Monocytes Relative: 10 %
Neutro Abs: 3.5 10*3/uL (ref 1.7–7.7)
Neutrophils Relative %: 74 %
Platelet Count: 232 10*3/uL (ref 150–400)
RBC: 2.37 MIL/uL — ABNORMAL LOW (ref 4.22–5.81)
RDW: 14.8 % (ref 11.5–15.5)
WBC Count: 4.7 10*3/uL (ref 4.0–10.5)
nRBC: 0 % (ref 0.0–0.2)

## 2019-11-04 LAB — TSH: TSH: 1.402 u[IU]/mL (ref 0.320–4.118)

## 2019-11-04 MED ORDER — SODIUM CHLORIDE 0.9% FLUSH
10.0000 mL | INTRAVENOUS | Status: DC | PRN
  Administered 2019-11-04: 10 mL
  Filled 2019-11-04: qty 10

## 2019-11-04 MED ORDER — HEPARIN SOD (PORK) LOCK FLUSH 100 UNIT/ML IV SOLN
500.0000 [IU] | Freq: Once | INTRAVENOUS | Status: AC | PRN
  Administered 2019-11-04: 13:00:00 500 [IU]
  Filled 2019-11-04: qty 5

## 2019-11-04 MED ORDER — SODIUM CHLORIDE 0.9 % IV SOLN
9.5000 mg/kg | Freq: Once | INTRAVENOUS | Status: AC
  Administered 2019-11-04: 740 mg via INTRAVENOUS
  Filled 2019-11-04: qty 4.8

## 2019-11-04 MED ORDER — SODIUM CHLORIDE 0.9 % IV SOLN
Freq: Once | INTRAVENOUS | Status: AC
  Administered 2019-11-04: 11:00:00 via INTRAVENOUS
  Filled 2019-11-04: qty 250

## 2019-11-04 NOTE — Patient Instructions (Signed)
[facility] Discharge Instructions for Patients Receiving Chemotherapy  Today you received the following chemotherapy agents Durvalumab (IMFINZI).  To help prevent nausea and vomiting after your treatment, we encourage you to take your nausea medication as prescribed.   If you develop nausea and vomiting that is not controlled by your nausea medication, call the clinic.   BELOW ARE SYMPTOMS THAT SHOULD BE REPORTED IMMEDIATELY:  *FEVER GREATER THAN 100.5 F  *CHILLS WITH OR WITHOUT FEVER  NAUSEA AND VOMITING THAT IS NOT CONTROLLED WITH YOUR NAUSEA MEDICATION  *UNUSUAL SHORTNESS OF BREATH  *UNUSUAL BRUISING OR BLEEDING  TENDERNESS IN MOUTH AND THROAT WITH OR WITHOUT PRESENCE OF ULCERS  *URINARY PROBLEMS  *BOWEL PROBLEMS  UNUSUAL RASH Items with * indicate a potential emergency and should be followed up as soon as possible.  Feel free to call the clinic should you have any questions or concerns. The clinic phone number is [phone].  Please show the [facility] at check-in to the Emergency Department and triage nurse.  Coronavirus (COVID-19) Are you at risk?  Are you at risk for the Coronavirus (COVID-19)?  To be considered HIGH RISK for Coronavirus (COVID-19), you have to meet the following criteria:  . Traveled to [place], [place], [place], [place] or [place]; or in the [place] to [place], [place], [place], or [place]; and have fever, cough, and shortness of breath within the last 2 weeks of travel OR . Been in close contact with a person diagnosed with COVID-19 within the last 2 weeks and have fever, cough, and shortness of breath . IF YOU DO NOT MEET THESE CRITERIA, YOU ARE CONSIDERED LOW RISK FOR COVID-19.  What to do if you are HIGH RISK for COVID-19?  Marland Kitchen If you are having a medical emergency, call 911. . Seek medical care right away. Before you go to a doctor's office, urgent care or emergency department, call ahead and tell them  about your recent travel, contact with someone diagnosed with COVID-19, and your symptoms. You should receive instructions from your physician's office regarding next steps of care.  . When you arrive at healthcare provider, tell the healthcare staff immediately you have returned from visiting [place], [place], [place], [place] or [place]; or traveled in the [place] to [place], [place], [place], or [place]; in the last two weeks or you have been in close contact with a person diagnosed with COVID-19 in the last 2 weeks.   . Tell the health care staff about your symptoms: fever, cough and shortness of breath. . After you have been seen by a medical provider, you will be either: o Tested for (COVID-19) and discharged home on quarantine except to seek medical care if symptoms worsen, and asked to  - Stay home and avoid contact with others until you get your results (4-5 days)  - Avoid travel on public transportation if possible (such as bus, train, or airplane) or o Sent to the Emergency Department by EMS for evaluation, COVID-19 testing, and possible admission depending on your condition and test results.  What to do if you are LOW RISK for COVID-19?  Reduce your risk of any infection by using the same precautions used for avoiding the common cold or flu:  Marland Kitchen Wash your hands often with soap and warm water for at least 20 seconds.  If soap and water are not readily available, use an alcohol-based hand sanitizer with at least 60% alcohol.  . If coughing or  sneezing, cover your mouth and nose by coughing or sneezing into the elbow areas of your shirt or coat, into a tissue or into your sleeve (not your hands). . Avoid shaking hands with others and consider head nods or verbal greetings only. . Avoid touching your eyes, nose, or mouth with unwashed hands.  . Avoid close contact with people who are sick. . Avoid places or events with large numbers of people in one location, like concerts or  sporting events. . Carefully consider travel plans you have or are making. . If you are planning any travel outside or inside the [place], visit the CDC's Travelers' Health webpage for the latest health notices. . If you have some symptoms but not all symptoms, continue to monitor at home and seek medical attention if your symptoms worsen. . If you are having a medical emergency, call 911.   [place] / e-Visit: [email or web address]         [facility]: [facility]: [phone]                   [facility]: [phone]

## 2019-11-04 NOTE — Progress Notes (Signed)
[facility] [phone]   [phone]  OFFICE PROGRESS NOTE  [name], MD [address] [place] [address]  DIAGNOSIS: Stage IIIB (T1b, N3, M0) non-small cell lung cancer favoring adenocarcinoma diagnosed in January 2020 and presented with left lower lobe pulmonary nodule in addition to bilateral hilar and subcarinal lymphadenopathy.  Molecular studies by guardant 360 showed no actionable mutations.  PRIOR THERAPY: Concurrent chemoradiation with weekly carboplatin for AUC of 2 and paclitaxel 45 mg/M2. First dose February 4th, 2020. Status post 5 cycles.   CURRENT THERAPY:  Consolidation immunotherapy with Imfinzi 10 mg/KG every 2 weeks, status post 16 cycles.  INTERVAL HISTORY: Robert Dixon 78 y.o. male returns to the clinic today for follow-up visit.  The patient is feeling fine today with no concerning complaints except for the baseline shortness of breath and low back pain.  He is currently on tramadol by his primary care physician for the chronic back pain.  He denied having any current chest pain, cough or hemoptysis.  He denied having any fever or chills.  He has no nausea, vomiting, diarrhea or constipation.  He has no headache or visual changes.  The patient is here today for evaluation before starting cycle #17 of his treatment.  MEDICAL HISTORY: Past Medical History:  Diagnosis Date  . Cancer ([place])    skin-   . Complication of anesthesia   . COPD (chronic obstructive pulmonary disease) ([place])   . Dyspnea   . GERD (gastroesophageal reflux disease)   . Hemoptysis 11/20/2018  . History of hiatal hernia   . Hypertension   . Hypoxia 11/20/2018  . 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 Migraine with visual aura   . Non-small cell lung cancer ([place]) dx'd 11/20/18  . Pneumonia 11/20/2018  . PONV (postoperative nausea and vomiting)   . Pulmonary nodule 11/20/2018    ALLERGIES:  has No Known Allergies.   MEDICATIONS:  Current Outpatient Medications  Medication Sig Dispense Refill  . albuterol (PROVENTIL HFA;VENTOLIN HFA) 108 (90 Base) MCG/ACT inhaler Inhale 2 puffs into the lungs every 6 (six) hours as needed for wheezing or shortness of breath. 1 Inhaler 6  . albuterol (PROVENTIL) (2.5 MG/3ML) 0.083% nebulizer solution Take 3 mLs (2.5 mg total) by nebulization every 4 (four) hours. 360 mL 0  . benazepril (LOTENSIN) 10 MG tablet Take 10 mg by mouth daily.    . Budeson-Glycopyrrol-Formoterol (BREZTRI AEROSPHERE) 160-9-4.8 MCG/ACT AERO Inhale 2 puffs into the lungs every 12 (twelve) hours. 10.7 g 5  . Cyanocobalamin (VITAMIN B 12 PO) Take 1 capsule by mouth daily. 2500 mcg    . dimenhyDRINATE (DRAMAMINE) 50 MG tablet Take 50 mg by mouth every 8 (eight) hours as needed for dizziness.     Hunt Oris (IMFINZI IV) Inject into the vein every 14 (fourteen) days.    Marland Kitchen lidocaine-prilocaine (EMLA) cream Apply 1 application topically as needed. 30 g 0  . Multiple Vitamin (MULTIVITAMIN) tablet Take 1 tablet by mouth daily.    . pantoprazole (PROTONIX) 40 MG tablet Take 40 mg by mouth 2 (two) times daily.    . polycarbophil (FIBERCON) 625 MG tablet Take 625 mg by mouth 2 (two) times daily.     . prochlorperazine (COMPAZINE) 10 MG tablet TAKE ONE TABLET BY MOUTH EVERY 6 HOURS AS NEEDED FOR NAUSEA OR VOMITING 30 tablet 0  . tamsulosin (FLOMAX) 0.4 MG CAPS capsule     . traMADol (ULTRAM) 50 MG tablet  Take 50 mg by mouth 2 (two) times daily as needed.     No current facility-administered medications for this visit.    Facility-Administered Medications Ordered in Other Visits  Medication Dose Route Frequency Provider Last Rate Last Dose  . sodium chloride flush (NS) 0.9 % injection 10 mL  10 mL Intracatheter PRN [name], MD      . sodium chloride flush (NS) 0.9 % injection 10 mL  10 mL Intracatheter PRN [name], MD   10 mL at 05/20/19 1232    SURGICAL HISTORY:  Past Surgical History:   Procedure Laterality Date  . COLONOSCOPY W/ POLYPECTOMY    . IR IMAGING GUIDED PORT INSERTION  03/14/2019  . TONSILLECTOMY    . VASECTOMY    . VIDEO BRONCHOSCOPY WITH ENDOBRONCHIAL ULTRASOUND N/A 12/06/2018   Procedure: VIDEO BRONCHOSCOPY WITH ENDOBRONCHIAL ULTRASOUND;  Surgeon: Garner Nash, DO;  Location: [facility] OR;  Service: Thoracic;  Laterality: N/A;    REVIEW OF SYSTEMS:  A comprehensive review of systems was negative except for: Constitutional: positive for fatigue Respiratory: positive for dyspnea on exertion   PHYSICAL EXAMINATION: General appearance: alert, cooperative, fatigued and no distress Head: Normocephalic, without obvious abnormality, atraumatic Neck: no adenopathy, no JVD, supple, symmetrical, trachea midline and thyroid not enlarged, symmetric, no tenderness/mass/nodules Lymph nodes: Cervical, supraclavicular, and axillary nodes normal. Resp: clear to auscultation bilaterally Back: symmetric, no curvature. ROM normal. No CVA tenderness. Cardio: regular rate and rhythm, S1, S2 normal, no murmur, click, rub or gallop GI: soft, non-tender; bowel sounds normal; no masses,  no organomegaly Extremities: extremities normal, atraumatic, no cyanosis or edema  ECOG PERFORMANCE STATUS: 1 - Symptomatic but completely ambulatory  Blood pressure (!) 112/48, pulse 99, temperature 98.3 F (36.8 C), temperature source Oral, resp. rate 17, height 5\' 8"  (1.727 m), weight 157 lb (71.2 kg), SpO2 94 %.  LABORATORY DATA: Lab Results  Component Value Date   WBC 4.7 11/04/2019   HGB 9.6 (L) 11/04/2019   HCT 28.9 (L) 11/04/2019   MCV 121.9 (H) 11/04/2019   PLT 232 11/04/2019      Chemistry      Component Value Date/Time   NA 141 10/21/2019 0945   K 4.2 10/21/2019 0945   CL 110 10/21/2019 0945   CO2 23 10/21/2019 0945   BUN 12 10/21/2019 0945   CREATININE 0.95 10/21/2019 0945      Component Value Date/Time   CALCIUM 8.5 (L) 10/21/2019 0945   ALKPHOS 94 10/21/2019 0945   AST  15 10/21/2019 0945   ALT 12 10/21/2019 0945   BILITOT 0.9 10/21/2019 0945       RADIOGRAPHIC STUDIES: Dg Chest 1 View  Result Date: 10/08/2019 CLINICAL DATA:  Left pleural effusion. Status post thoracentesis. EXAM: CHEST  1 VIEW COMPARISON:  10/01/2019 FINDINGS: Left pleural effusion has nearly completely resolved since previous study. There is persistent opacity in the left retrocardiac lung base. No pneumothorax is visualized. Right lung remains clear. Right-sided power port remains in appropriate position. Heart size is normal. IMPRESSION: Near complete resolution of left pleural effusion. No pneumothorax visualized. Persistent left retrocardiac opacity. Electronically Signed   By: Marlaine Hind M.D.   On: 10/08/2019 08:01    ASSESSMENT AND PLAN: This is a very pleasant 78 years old white male diagnosed with stage IIIb non-small cell lung cancer, adenocarcinoma with no actionable mutations  Completed the course of concurrent chemoradiation with weekly carboplatin and paclitaxel status post 5 cycles.  He has partial response to this  treatment. The patient is currently undergoing consolidation treatment with immunotherapy with Imfinzi status post 16 cycles.   The patient has been tolerating this treatment well with no concerning adverse effects. I recommended for him to proceed with cycle #17 today as planned. I will see him back for follow-up visit in 2 weeks for evaluation before the next cycle of his treatment. The patient was advised to call immediately if he has any concerning symptoms in the interval. The patient voices understanding of current disease status and treatment options and is in agreement with the current care plan.  All questions were answered. The patient knows to call the clinic with any problems, questions or concerns. We can certainly see the patient much sooner if necessary.  Disclaimer: This note was dictated with voice recognition software. Similar sounding words  can inadvertently be transcribed and may not be corrected upon review.

## 2019-11-05 ENCOUNTER — Encounter: Payer: Self-pay | Admitting: *Deleted

## 2019-11-07 DIAGNOSIS — J432 Centrilobular emphysema: Secondary | ICD-10-CM | POA: Diagnosis not present

## 2019-11-07 DIAGNOSIS — C3492 Malignant neoplasm of unspecified part of left bronchus or lung: Secondary | ICD-10-CM | POA: Diagnosis not present

## 2019-11-07 DIAGNOSIS — J9600 Acute respiratory failure, unspecified whether with hypoxia or hypercapnia: Secondary | ICD-10-CM | POA: Diagnosis not present

## 2019-11-07 DIAGNOSIS — J439 Emphysema, unspecified: Secondary | ICD-10-CM | POA: Diagnosis not present

## 2019-11-07 DIAGNOSIS — J90 Pleural effusion, not elsewhere classified: Secondary | ICD-10-CM | POA: Diagnosis not present

## 2019-11-07 DIAGNOSIS — J9611 Chronic respiratory failure with hypoxia: Secondary | ICD-10-CM | POA: Diagnosis not present

## 2019-11-07 DIAGNOSIS — C3412 Malignant neoplasm of upper lobe, left bronchus or lung: Secondary | ICD-10-CM | POA: Diagnosis not present

## 2019-11-11 ENCOUNTER — Telehealth: Payer: Self-pay | Admitting: Medical Oncology

## 2019-11-11 NOTE — Telephone Encounter (Signed)
Refaxed oxygen order to [facility].

## 2019-11-13 ENCOUNTER — Ambulatory Visit (INDEPENDENT_AMBULATORY_CARE_PROVIDER_SITE_OTHER): Payer: Medicare HMO | Admitting: Pulmonary Disease

## 2019-11-13 ENCOUNTER — Other Ambulatory Visit: Payer: Self-pay

## 2019-11-13 DIAGNOSIS — J432 Centrilobular emphysema: Secondary | ICD-10-CM | POA: Diagnosis not present

## 2019-11-13 DIAGNOSIS — J9611 Chronic respiratory failure with hypoxia: Secondary | ICD-10-CM | POA: Diagnosis not present

## 2019-11-13 DIAGNOSIS — Z5112 Encounter for antineoplastic immunotherapy: Secondary | ICD-10-CM | POA: Diagnosis not present

## 2019-11-13 DIAGNOSIS — J90 Pleural effusion, not elsewhere classified: Secondary | ICD-10-CM

## 2019-11-13 DIAGNOSIS — C3492 Malignant neoplasm of unspecified part of left bronchus or lung: Secondary | ICD-10-CM | POA: Diagnosis not present

## 2019-11-13 DIAGNOSIS — J701 Chronic and other pulmonary manifestations due to radiation: Secondary | ICD-10-CM

## 2019-11-13 NOTE — Progress Notes (Signed)
Synopsis: Referred in January 2020 for abnormal CT, lung mass, mediastinal adenopathy by [name], MD  Subjective:   PATIENT ID: Robert Dixon: male DOB: 11/04/1941, MRN: [id number]  Chief Complaint  Patient presents with  . Follow-up    Televisit    PMH of former smoker. He had a cough for several weeks, month treated with abx. He started coughing up blood.  Patient was taken to [facility] where he was treated for pneumonia.  He was given a steroid pack and antibiotics.  He was treated with bronchodilators while he was there.  CT scan imaging revealed a left infrahilar mass with endobronchial disease within the upper lobe as well as enlarged mediastinal adenopathy.  Patient was referred to our pulmonary office for evaluation of potential underlying lung cancer.  OV 06/18/2019: Since we were last seen each other in the office patient has underwent bronchoscopy back in January for diagnosis of his lung cancer.  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  He has subsequently underwent concurrent chemoradiation with weekly carboplatinum and paclitaxel and his now on consolidation immunotherapy Imfinzi every 2 weeks.  He has been seen in the office over the past month with a diagnosis of pneumonia.  He was treated for this as an outpatient and has overall recovered.  He does feel like his functional status has slowly declined.  He feels much weaker than he did before.  But has been stable over the past few weeks.  He does feel better after treatment for pneumonia.  He had follow-up CAT scan imaging which reveals radiation changes to the left lung.  At this time he is still using his oxygen continuously.  CT scan revealed radiation fibrosis.  OV 10/01/2019:Patient followed in clinic today for history of lung cancer, undergoing immunotherapy treatments following concurrent chemoradiation.  Currently  on Imfinzi.  Patient last seen by oncology on 09/23/2019.  Patient doing well.  Here today for follow-up regarding dyspnea.  He does feel more short of breath than was before.  He did have urinary symptoms with Trelegy and this was stopped.  Urinary symptoms are stable at this time no hesitancy.  He recently had CT imaging of the chest in the middle of October which revealed a loculated left side pleural effusion.  He still has significant radiation-induced fibrosis of the left lung status post his chemo XRT.  Otherwise doing well.  He states he has follow-up planned with cardiology.  Has a echocardiogram with severe aortic stenosis with an aortic valve area of 0.78 and a mean gradient of 39.  OV 11/13/2019:  Virtual Visit via Telephone Note  I connected withJoseph D Dixon on 11/13/19 at  2:30 PM EST by telephoneand verified that I am speaking with the correct person using two identifiers.  Location: Patient: Robert Dixon  Provider: Garner Nash, DO   I discussed the limitations, risks, security and privacy concerns of performing an evaluation and management service by telephone and the availability of in person appointments. I also discussed with the patient that there may be a patient responsible charge related to this service. The patient expressed understanding and agreed to proceed.  History of Present Illness: 78 year old gentleman currently undergoing treatments for stage IIIb adenocarcinoma.  Immunotherapy at this time with Dr. Earlie Server.  Here today for follow-up and discussion of his current symptoms.  Still has significant symptoms to include dyspnea on exertion  and shortness of breath.  Was seen by cardiology Dr. Percival Spanish.  Echocardiogram with severe aortic stenosis.  Has follow-up with him planned.  Overall symptoms stable on new inhaler regimen.  He still has shortness of breath but he has less urinary symptoms and hesitancy by switching to breztri.    Observations/Objective: Able to complete full sentences.  Nonlabored breathing, no audible wheeze  Assessment and Plan: Stage IIIb adenocarcinoma of the lung Completed chemotherapy plus radiation now on immunotherapy. Severe aortic stenosis Likely COPD Plan: Cardiology follow-up Continue immunotherapy per oncology Continue current inhaler regimen to include triple therapy plus as needed albuterol. If symptoms of dyspnea change or increase can consider repeat chest film to look for recurrence of effusion.  Follow Up Instructions: RTC 3 months or as needed based on symptomatology  I discussed the assessment and treatment plan with the patient. The patient was provided an opportunity to ask questions and all were answered. The patient agreed with the plan and demonstrated an understanding of the instructions.  The patient was advised to call back or seek an in-person evaluation if the symptoms worsen or if the condition fails to improve as anticipated.  I provided 14 minutes of non-face-to-face time during this encounter.   Garner Nash, DO    Past Medical History:  Diagnosis Date  . Cancer ([place])    skin-   . Complication of anesthesia   . COPD (chronic obstructive pulmonary disease) ([place])   . Dyspnea   . GERD (gastroesophageal reflux disease)   . Hemoptysis 11/20/2018  . History of hiatal hernia   . Hypertension   . Hypoxia 11/20/2018  . 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 Migraine with visual aura   . Non-small cell lung cancer ([place]) dx'd 11/20/18  . Pneumonia 11/20/2018  . PONV (postoperative nausea and vomiting)   . Pulmonary nodule 11/20/2018     Family History  Problem Relation Age of Onset  . Cancer - Lung Mother   . Hypertension Father   . CVA Father   . Cancer - Cervical Sister      Past Surgical History:  Procedure Laterality Date  . COLONOSCOPY W/ POLYPECTOMY    . IR IMAGING GUIDED PORT INSERTION  03/14/2019  . TONSILLECTOMY    . VASECTOMY     . VIDEO BRONCHOSCOPY WITH ENDOBRONCHIAL ULTRASOUND N/A 12/06/2018   Procedure: VIDEO BRONCHOSCOPY WITH ENDOBRONCHIAL ULTRASOUND;  Surgeon: Garner Nash, DO;  Location: [facility] OR;  Service: Thoracic;  Laterality: N/A;    Social History   Socioeconomic History  . Marital status: Widowed    Spouse name: Not on file  . Number of children: Not on file  . Years of education: Not on file  . Highest education level: Not on file  Occupational History  . Not on file  Tobacco Use  . Smoking status: Former Smoker    Years: 30.00    Types: Cigarettes    Quit date: 2000    Years since quitting: 20.9  . Smokeless tobacco: Never Used  Substance and Sexual Activity  . Alcohol use: No  . Drug use: No  . Sexual activity: Not on file  Other Topics Concern  . Not on file  Social History Narrative   Lives alone.  Two sons.  Widower.     Social Determinants of Health   Financial Resource Strain:   . Difficulty of Paying Living Expenses: Not on file  Food Insecurity:   . Worried About Charity fundraiser in the  Last Year: Not on file  . Ran Out of Food in the Last Year: Not on file  Transportation Needs: No Transportation Needs  . Lack of Transportation (Medical): No  . Lack of Transportation (Non-Medical): No  Physical Activity:   . Days of Exercise per Week: Not on file  . Minutes of Exercise per Session: Not on file  Stress:   . Feeling of Stress : Not on file  Social Connections:   . Frequency of Communication with Friends and Family: Not on file  . Frequency of Social Gatherings with Friends and Family: Not on file  . Attends Religious Services: Not on file  . Active Member of Clubs or Organizations: Not on file  . Attends Archivist Meetings: Not on file  . Marital Status: Not on file  Intimate Partner Violence:   . Fear of Current or Ex-Partner: Not on file  . Emotionally Abused: Not on file  . Physically Abused: Not on file  . Sexually Abused: Not on file      No Known Allergies   Outpatient Medications Prior to Visit  Medication Sig Dispense Refill  . albuterol (PROVENTIL HFA;VENTOLIN HFA) 108 (90 Base) MCG/ACT inhaler Inhale 2 puffs into the lungs every 6 (six) hours as needed for wheezing or shortness of breath. 1 Inhaler 6  . albuterol (PROVENTIL) (2.5 MG/3ML) 0.083% nebulizer solution Take 3 mLs (2.5 mg total) by nebulization every 4 (four) hours. 360 mL 0  . benazepril (LOTENSIN) 10 MG tablet Take 10 mg by mouth daily.    . Cyanocobalamin (VITAMIN B 12 PO) Take 1 capsule by mouth daily. 2500 mcg    . dimenhyDRINATE (DRAMAMINE) 50 MG tablet Take 50 mg by mouth every 8 (eight) hours as needed for dizziness.     Hunt Oris (IMFINZI IV) Inject into the vein every 14 (fourteen) days.    Marland Kitchen lidocaine-prilocaine (EMLA) cream Apply 1 application topically as needed. 30 g 0  . Multiple Vitamin (MULTIVITAMIN) tablet Take 1 tablet by mouth daily.    . pantoprazole (PROTONIX) 40 MG tablet Take 40 mg by mouth 2 (two) times daily.    . polycarbophil (FIBERCON) 625 MG tablet Take 625 mg by mouth 2 (two) times daily.     . prochlorperazine (COMPAZINE) 10 MG tablet TAKE ONE TABLET BY MOUTH EVERY 6 HOURS AS NEEDED FOR NAUSEA OR VOMITING 30 tablet 0  . tamsulosin (FLOMAX) 0.4 MG CAPS capsule     . traMADol (ULTRAM) 50 MG tablet Take 50 mg by mouth 2 (two) times daily as needed.     Facility-Administered Medications Prior to Visit  Medication Dose Route Frequency Provider Last Rate Last Admin  . sodium chloride flush (NS) 0.9 % injection 10 mL  10 mL Intracatheter PRN [name], MD      . sodium chloride flush (NS) 0.9 % injection 10 mL  10 mL Intracatheter PRN [name], MD   10 mL at 05/20/19 1232    ROS   Objective:  Physical Exam   There were no vitals filed for this visit.   on RA BMI Readings from Last 3 Encounters:  11/04/19 23.87 kg/m  10/21/19 23.98 kg/m  10/15/19 22.93 kg/m   Wt Readings from Last 3 Encounters:   11/04/19 157 lb (71.2 kg)  10/21/19 157 lb 11.2 oz (71.5 kg)  10/15/19 153 lb (69.4 kg)    CBC    Component Value Date/Time   WBC 4.7 11/04/2019 0858   WBC 6.5 03/14/2019 0929  RBC 2.37 (L) 11/04/2019 0858   HGB 9.6 (L) 11/04/2019 0858   HCT 28.9 (L) 11/04/2019 0858   PLT 232 11/04/2019 0858   MCV 121.9 (H) 11/04/2019 0858   MCH 40.5 (H) 11/04/2019 0858   MCHC 33.2 11/04/2019 0858   RDW 14.8 11/04/2019 0858   LYMPHSABS 0.6 (L) 11/04/2019 0858   MONOABS 0.5 11/04/2019 0858   EOSABS 0.0 11/04/2019 0858   BASOSABS 0.1 11/04/2019 0858    Chest Imaging: CT chest 11/20/2018: Left hilar mass with associated adenopathy endobronchial narrowing, subcarinal and bilateral hilar adenopathy. Ill-defined small density within the liver possible focus of metastatic disease.  CT chest July 2020: Fibrotic post radiation changes into the left chest, pleural thickening, intralobular septal thickening The patient's images have been independently reviewed by me.    CT chest October 2020: Fibrotic changes of the left lung pleural thickening interlobular septal thickening and banding consistent with radiation treatments.  Also has a loculated left-sided effusion.  Pleural effusion is larger in comparison to previous CT imaging. The patient's images have been independently reviewed by me.    Pulmonary Functions Testing Results: No flowsheet data found.  FeNO: None   Pathology: None   Echocardiogram:  October 2020: Severe calcific aortic stenosis.  Please see echo report for details  Heart Catheterization: None     Assessment & Plan:     ICD-10-CM   1. Non-small cell carcinoma of left lung, stage 3 (HCC)  C34.92   2. Centrilobular emphysema ([place])  J43.2   3. Chronic respiratory failure with hypoxia (HCC)  J96.11   4. Pleural effusion  J90   5. 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 Maintenance antineoplastic immunotherapy  Z51.12     Discussion:    Current Outpatient  Medications:  .  albuterol (PROVENTIL HFA;VENTOLIN HFA) 108 (90 Base) MCG/ACT inhaler, Inhale 2 puffs into the lungs every 6 (six) hours as needed for wheezing or shortness of breath., Disp: 1 Inhaler, Rfl: 6 .  albuterol (PROVENTIL) (2.5 MG/3ML) 0.083% nebulizer solution, Take 3 mLs (2.5 mg total) by nebulization every 4 (four) hours., Disp: 360 mL, Rfl: 0 .  benazepril (LOTENSIN) 10 MG tablet, Take 10 mg by mouth daily., Disp: , Rfl:  .  Cyanocobalamin (VITAMIN B 12 PO), Take 1 capsule by mouth daily. 2500 mcg, Disp: , Rfl:  .  dimenhyDRINATE (DRAMAMINE) 50 MG tablet, Take 50 mg by mouth every 8 (eight) hours as needed for dizziness. , Disp: , Rfl:  .  Durvalumab (IMFINZI IV), Inject into the vein every 14 (fourteen) days., Disp: , Rfl:  .  lidocaine-prilocaine (EMLA) cream, Apply 1 application topically as needed., Disp: 30 g, Rfl: 0 .  Multiple Vitamin (MULTIVITAMIN) tablet, Take 1 tablet by mouth daily., Disp: , Rfl:  .  pantoprazole (PROTONIX) 40 MG tablet, Take 40 mg by mouth 2 (two) times daily., Disp: , Rfl:  .  polycarbophil (FIBERCON) 625 MG tablet, Take 625 mg by mouth 2 (two) times daily. , Disp: , Rfl:  .  prochlorperazine (COMPAZINE) 10 MG tablet, TAKE ONE TABLET BY MOUTH EVERY 6 HOURS AS NEEDED FOR NAUSEA OR VOMITING, Disp: 30 tablet, Rfl: 0 .  tamsulosin (FLOMAX) 0.4 MG CAPS capsule, , Disp: , Rfl:  .  traMADol (ULTRAM) 50 MG tablet, Take 50 mg by mouth 2 (two) times daily as needed., Disp: , Rfl:  No current facility-administered medications for this visit.  Facility-Administered Medications Ordered in Other Visits:  .  sodium chloride flush (  NS) 0.9 % injection 10 mL, 10 mL, Intracatheter, PRN, [name], MD .  sodium chloride flush (NS) 0.9 % injection 10 mL, 10 mL, Intracatheter, PRN, [name], MD, 10 mL at 05/20/19 1232   Garner Nash, DO [facility] 11/13/2019 1:51 PM

## 2019-11-13 NOTE — Patient Instructions (Signed)
Thank you for visiting [name] at [facility]. Today we recommend the following:  Return in about 3 months (around 02/11/2020) for with APP or [name].    Please do your part to reduce the spread of COVID-19.

## 2019-11-17 NOTE — Progress Notes (Signed)
[facility] OFFICE PROGRESS NOTE  [name], MD [place] [address] [place] [address]  DIAGNOSIS: Stage IIIB(T1b, N3, M0) non-small cell lung cancer favoring adenocarcinoma diagnosed in January 2020 and presented with left lower lobe pulmonary nodule in addition to bilateral hilar and subcarinal lymphadenopathy  PRIOR THERAPY: Concurrent chemoradiation with weekly carboplatin for an AUC of 2 and paclitaxel 45 mg/m2. Status post 5 cycles.  CURRENT THERAPY: Consolidation immunotherapy with Imfinzi 10 mg/kg IV every 2 weeks. First dose March 25, 2019.Status post17cycles.  INTERVAL HISTORY: Robert Dixon 78 y.o. male returns to the clinic for follow-up visit.  The patient is feeling well today without any concerning complaints.  He continues to tolerate his treatment with immunotherapy fairly well without any concerning adverse effects except for fatigue 1 to 2 days following treatment. He denies any recent fever, chills, night sweats, or weight loss. He reports his baseline shortness of breath with exertion which is multifactorial.  He follows closely with pulmonology as well as cardiology for this concern.  He denies any headache or visual changes.  He denies any rashes or skin changes.  He denies any nausea, vomiting, diarrhea, or constipation. He continues to have persistent but stable back discomfort from his compression fracture. His PCP prescribed tramadol for his pain. He also uses patches which may be lidocaine patches to help alleviate his symptoms. He is here today for evaluation before starting cycle #18.  MEDICAL HISTORY: Past Medical History:  Diagnosis Date  . Cancer ([place])    skin-   . Complication of anesthesia   . COPD (chronic obstructive pulmonary disease) ([place])   . Dyspnea   . GERD (gastroesophageal reflux disease)   . Hemoptysis 11/20/2018  . History of hiatal hernia   . Hypertension   . Hypoxia 11/20/2018  . 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 Migraine with visual aura   . Non-small cell lung cancer ([place]) dx'd 11/20/18  . Pneumonia 11/20/2018  . PONV (postoperative nausea and vomiting)   . Pulmonary nodule 11/20/2018    ALLERGIES:  has No Known Allergies.  MEDICATIONS:  Current Outpatient Medications  Medication Sig Dispense Refill  . albuterol (PROVENTIL HFA;VENTOLIN HFA) 108 (90 Base) MCG/ACT inhaler Inhale 2 puffs into the lungs every 6 (six) hours as needed for wheezing or shortness of breath. 1 Inhaler 6  . albuterol (PROVENTIL) (2.5 MG/3ML) 0.083% nebulizer solution Take 3 mLs (2.5 mg total) by nebulization every 4 (four) hours. 360 mL 0  . benazepril (LOTENSIN) 10 MG tablet Take 10 mg by mouth daily.    . Cyanocobalamin (VITAMIN B 12 PO) Take 1 capsule by mouth daily. 2500 mcg    . dimenhyDRINATE (DRAMAMINE) 50 MG tablet Take 50 mg by mouth every 8 (eight) hours as needed for dizziness.     Hunt Oris (IMFINZI IV) Inject into the vein every 14 (fourteen) days.    Marland Kitchen lidocaine-prilocaine (EMLA) cream Apply 1 application topically as needed. 30 g 0  . Multiple Vitamin (MULTIVITAMIN) tablet Take 1 tablet by mouth daily.    . pantoprazole (PROTONIX) 40 MG tablet Take 40 mg by mouth 2 (two) times daily.    . polycarbophil (FIBERCON) 625 MG tablet Take 625 mg by mouth 2 (two) times daily.     . prochlorperazine (COMPAZINE) 10 MG tablet TAKE ONE TABLET BY MOUTH EVERY 6 HOURS AS NEEDED FOR NAUSEA OR VOMITING 30 tablet 0  . tamsulosin (FLOMAX) 0.4 MG CAPS capsule     .  traMADol (ULTRAM) 50 MG tablet Take 50 mg by mouth 2 (two) times daily as needed.     No current facility-administered medications for this visit.   Facility-Administered Medications Ordered in Other Visits  Medication Dose Route Frequency Provider Last Rate Last Admin  . durvalumab (IMFINZI) 740 mg in sodium chloride 0.9 % 100 mL chemo infusion  740 mg Intravenous Once [name], MD 115 mL/hr at 11/18/19 1224 740 mg at 11/18/19 1224  .  heparin lock flush 100 unit/mL  500 Units Intracatheter Once PRN [name], MD      . sodium chloride flush (NS) 0.9 % injection 10 mL  10 mL Intracatheter PRN [name], MD      . sodium chloride flush (NS) 0.9 % injection 10 mL  10 mL Intracatheter PRN [name], MD   10 mL at 05/20/19 1232  . sodium chloride flush (NS) 0.9 % injection 10 mL  10 mL Intracatheter PRN [name], MD        SURGICAL HISTORY:  Past Surgical History:  Procedure Laterality Date  . COLONOSCOPY W/ POLYPECTOMY    . IR IMAGING GUIDED PORT INSERTION  03/14/2019  . TONSILLECTOMY    . VASECTOMY    . VIDEO BRONCHOSCOPY WITH ENDOBRONCHIAL ULTRASOUND N/A 12/06/2018   Procedure: VIDEO BRONCHOSCOPY WITH ENDOBRONCHIAL ULTRASOUND;  Surgeon: Garner Nash, DO;  Location: [facility] OR;  Service: Thoracic;  Laterality: N/A;    REVIEW OF SYSTEMS:   Review of Systems  Constitutional:Positive for fatigue.Negative for appetite change, chills, fever and unexpected weight change.  HENT: Negative for mouth sores, nosebleeds, sore throat and trouble swallowing.  Eyes: Negative for eye problems and icterus.  Respiratory:Positive for shortness of breath,Negative for cough, hemoptysis, and wheezing.  Cardiovascular: Negative for chest pain and leg swelling.  Gastrointestinal: Negative for abdominal pain, constipation, diarrhea, nausea and vomiting.  Genitourinary: Negative for bladder incontinence, difficulty urinating, dysuria, frequency and hematuria.  Musculoskeletal: Negative for back pain, gait problem, neck pain and neck stiffness.  Skin: Negative for itching and rash.  Neurological: Negative for dizziness, extremity weakness, gait problem, headaches, light-headedness and seizures.  Hematological: Negative for adenopathy. Does not bruise/bleed easily.  Psychiatric/Behavioral: Negative for confusion, depression and sleep disturbance. The patient is not nervous/anxious. nervous/anxious.     PHYSICAL  EXAMINATION:  Blood pressure (!) 102/50, pulse 85, temperature 98.3 F (36.8 C), temperature source Temporal, resp. rate 18, height 5\' 8"  (1.727 m), weight 154 lb 1.6 oz (69.9 kg), SpO2 96 %.  ECOG PERFORMANCE STATUS: 2 - Symptomatic, <50% confined to bed  Physical Exam  Constitutional: Oriented to person, place, and time and elderly appearing male and in no distress.  HENT:  Head: Normocephalic and atraumatic.  Mouth/Throat: Oropharynx is clear and moist. No oropharyngeal exudate.  Eyes: Conjunctivae are normal. Right eye exhibits no discharge. Left eye exhibits no discharge. No scleral icterus.  Neck: Normal range of motion. Neck supple.  Cardiovascular: Systolic ejection murmur. Normal rate, regular rhythm, and intact distal pulses.   Pulmonary/Chest: Effort normal and breath sounds normal. No respiratory distress. No wheezes. No rales.  Abdominal: Soft. Bowel sounds are normal. Exhibits no distension and no mass. There is no tenderness.  Musculoskeletal: Normal range of motion. Exhibits no edema.  Lymphadenopathy:    No cervical adenopathy.  Neurological: Alert and oriented to person, place, and time. Exhibits normal muscle tone. Gait normal. Coordination normal.  Skin: Skin is warm and dry. No rash noted. Not diaphoretic. No erythema. No pallor.  Psychiatric: Mood,  memory and judgment normal.  Vitals reviewed.   LABORATORY DATA: Lab Results  Component Value Date   WBC 4.9 11/18/2019   HGB 9.3 (L) 11/18/2019   HCT 28.0 (L) 11/18/2019   MCV 122.3 (H) 11/18/2019   PLT 224 11/18/2019      Chemistry      Component Value Date/Time   NA 141 11/18/2019 1045   K 4.8 11/18/2019 1045   CL 109 11/18/2019 1045   CO2 25 11/18/2019 1045   BUN 16 11/18/2019 1045   CREATININE 0.92 11/18/2019 1045      Component Value Date/Time   CALCIUM 8.3 (L) 11/18/2019 1045   ALKPHOS 87 11/18/2019 1045   AST 13 (L) 11/18/2019 1045   ALT 12 11/18/2019 1045   BILITOT 0.8 11/18/2019 1045        RADIOGRAPHIC STUDIES:  No results found.   ASSESSMENT/PLAN:  This is a very pleasant 78 year old Caucasian male diagnosed with stage IIIb non-small cell lung cancer, adenocarcinoma of the left lower lobe. He was diagnosed in January 2020. He has no actionable mutations.  The patient underwent concurrent chemoradiation with carboplatin for an AUC of 2 and paclitaxel 45 mg/m. He is status post 5 cycles. He was unable to proceed with cycle #6 secondary to thrombocytopenia.   The patient is currently undergoing consolidation immunotherapy with Imfinzi 10 mg/kg IV every 2 weeks. Status post17cycles.He has been tolerating treatment well without any adverse effects.  Labs were reviewed. We recommend that the patient continue with cycle #18 today as scheduled.   I will arrange for the patient to have a restaging CT scan performed prior to his next visit.  We will see the patient back for follow-up visit in 2 weeks for evaluation and to review his scan results before starting cycle #19.  He will continue with his current pain regimen outlined by his PCP for his compression fracture.   He will continue to follow closely with pulmonology and cardiology regarding his COPD and aortic stenosis.  The patient was advised to call immediately if he has any concerning symptoms in the interval. The patient voices understanding of current disease status and treatment options and is in agreement with the current care plan. All questions were answered. The patient knows to call the clinic with any problems, questions or concerns. We can certainly see the patient much sooner if necessary    Orders Placed This Encounter  Procedures  . CT Chest W Contrast    Standing Status:   Future    Standing Expiration Date:   11/17/2020    Order Specific Question:   ** REASON FOR EXAM (FREE TEXT)    Answer:   Restaging Lung Cancer    Order Specific Question:   If indicated for the ordered procedure, I  authorize the administration of contrast media per Radiology protocol    Answer:   Yes    Order Specific Question:   Preferred imaging location?    Answer:   [facility]    Order Specific Question:   Radiology Contrast Protocol - do NOT remove file path    Answer:   [email or web address]     [place], PA-C 11/18/19

## 2019-11-18 ENCOUNTER — Inpatient Hospital Stay: Payer: Medicare HMO

## 2019-11-18 ENCOUNTER — Inpatient Hospital Stay (HOSPITAL_BASED_OUTPATIENT_CLINIC_OR_DEPARTMENT_OTHER): Payer: Medicare HMO | Admitting: Physician Assistant

## 2019-11-18 ENCOUNTER — Other Ambulatory Visit: Payer: Self-pay

## 2019-11-18 VITALS — BP 102/50 | HR 85 | Temp 98.3°F | Resp 18 | Ht 68.0 in | Wt 154.1 lb

## 2019-11-18 DIAGNOSIS — C3412 Malignant neoplasm of upper lobe, left bronchus or lung: Secondary | ICD-10-CM | POA: Diagnosis not present

## 2019-11-18 DIAGNOSIS — Z923 Personal history of irradiation: Secondary | ICD-10-CM | POA: Diagnosis not present

## 2019-11-18 DIAGNOSIS — R5383 Other fatigue: Secondary | ICD-10-CM | POA: Diagnosis not present

## 2019-11-18 DIAGNOSIS — Z95828 Presence of other vascular implants and grafts: Secondary | ICD-10-CM

## 2019-11-18 DIAGNOSIS — I350 Nonrheumatic aortic (valve) stenosis: Secondary | ICD-10-CM | POA: Diagnosis not present

## 2019-11-18 DIAGNOSIS — Z5112 Encounter for antineoplastic immunotherapy: Secondary | ICD-10-CM | POA: Diagnosis not present

## 2019-11-18 DIAGNOSIS — C3492 Malignant neoplasm of unspecified part of left bronchus or lung: Secondary | ICD-10-CM

## 2019-11-18 DIAGNOSIS — C3432 Malignant neoplasm of lower lobe, left bronchus or lung: Secondary | ICD-10-CM | POA: Diagnosis not present

## 2019-11-18 DIAGNOSIS — Z9221 Personal history of antineoplastic chemotherapy: Secondary | ICD-10-CM | POA: Diagnosis not present

## 2019-11-18 DIAGNOSIS — I10 Essential (primary) hypertension: Secondary | ICD-10-CM | POA: Diagnosis not present

## 2019-11-18 DIAGNOSIS — G8929 Other chronic pain: Secondary | ICD-10-CM | POA: Diagnosis not present

## 2019-11-18 DIAGNOSIS — J449 Chronic obstructive pulmonary disease, unspecified: Secondary | ICD-10-CM | POA: Diagnosis not present

## 2019-11-18 LAB — CMP (CANCER CENTER ONLY)
ALT: 12 U/L (ref 0–44)
AST: 13 U/L — ABNORMAL LOW (ref 15–41)
Albumin: 3.8 g/dL (ref 3.5–5.0)
Alkaline Phosphatase: 87 U/L (ref 38–126)
Anion gap: 7 (ref 5–15)
BUN: 16 mg/dL (ref 8–23)
CO2: 25 mmol/L (ref 22–32)
Calcium: 8.3 mg/dL — ABNORMAL LOW (ref 8.9–10.3)
Chloride: 109 mmol/L (ref 98–111)
Creatinine: 0.92 mg/dL (ref 0.61–1.24)
GFR, Est AFR Am: >60 mL/min (ref >60)
GFR, Estimated: >60 mL/min (ref >60)
Glucose, Bld: 96 mg/dL (ref 70–99)
Potassium: 4.8 mmol/L (ref 3.5–5.1)
Sodium: 141 mmol/L (ref 135–145)
Total Bilirubin: 0.8 mg/dL (ref 0.3–1.2)
Total Protein: 6 g/dL — ABNORMAL LOW (ref 6.5–8.1)

## 2019-11-18 LAB — CBC WITH DIFFERENTIAL (CANCER CENTER ONLY)
Abs Immature Granulocytes: 0.02 10*3/uL (ref 0.00–0.07)
Basophils Absolute: 0.1 10*3/uL (ref 0.0–0.1)
Basophils Relative: 2 %
Eosinophils Absolute: 0.1 10*3/uL (ref 0.0–0.5)
Eosinophils Relative: 1 %
HCT: 28 % — ABNORMAL LOW (ref 39.0–52.0)
Hemoglobin: 9.3 g/dL — ABNORMAL LOW (ref 13.0–17.0)
Immature Granulocytes: 0 %
Lymphocytes Relative: 13 %
Lymphs Abs: 0.6 10*3/uL — ABNORMAL LOW (ref 0.7–4.0)
MCH: 40.6 pg — ABNORMAL HIGH (ref 26.0–34.0)
MCHC: 33.2 g/dL (ref 30.0–36.0)
MCV: 122.3 fL — ABNORMAL HIGH (ref 80.0–100.0)
Monocytes Absolute: 0.4 10*3/uL (ref 0.1–1.0)
Monocytes Relative: 9 %
Neutro Abs: 3.7 10*3/uL (ref 1.7–7.7)
Neutrophils Relative %: 75 %
Platelet Count: 224 10*3/uL (ref 150–400)
RBC: 2.29 MIL/uL — ABNORMAL LOW (ref 4.22–5.81)
RDW: 14.7 % (ref 11.5–15.5)
WBC Count: 4.9 10*3/uL (ref 4.0–10.5)
nRBC: 0 % (ref 0.0–0.2)

## 2019-11-18 MED ORDER — SODIUM CHLORIDE 0.9 % IV SOLN
740.0000 mg | Freq: Once | INTRAVENOUS | Status: AC
  Administered 2019-11-18: 740 mg via INTRAVENOUS
  Filled 2019-11-18: qty 4.8

## 2019-11-18 MED ORDER — HEPARIN SOD (PORK) LOCK FLUSH 100 UNIT/ML IV SOLN
500.0000 [IU] | Freq: Once | INTRAVENOUS | Status: AC | PRN
  Administered 2019-11-18: 500 [IU]
  Filled 2019-11-18: qty 5

## 2019-11-18 MED ORDER — SODIUM CHLORIDE 0.9% FLUSH
10.0000 mL | INTRAVENOUS | Status: DC | PRN
  Administered 2019-11-18: 10 mL
  Filled 2019-11-18: qty 10

## 2019-11-18 MED ORDER — SODIUM CHLORIDE 0.9 % IV SOLN
Freq: Once | INTRAVENOUS | Status: AC
  Filled 2019-11-18: qty 250

## 2019-11-18 NOTE — Patient Instructions (Signed)

## 2019-11-18 NOTE — Patient Instructions (Signed)
[facility] Discharge Instructions for Patients Receiving Chemotherapy  Today you received the following chemotherapy agents Durvalumab (IMFINZI).  To help prevent nausea and vomiting after your treatment, we encourage you to take your nausea medication as prescribed.   If you develop nausea and vomiting that is not controlled by your nausea medication, call the clinic.   BELOW ARE SYMPTOMS THAT SHOULD BE REPORTED IMMEDIATELY:  *FEVER GREATER THAN 100.5 F  *CHILLS WITH OR WITHOUT FEVER  NAUSEA AND VOMITING THAT IS NOT CONTROLLED WITH YOUR NAUSEA MEDICATION  *UNUSUAL SHORTNESS OF BREATH  *UNUSUAL BRUISING OR BLEEDING  TENDERNESS IN MOUTH AND THROAT WITH OR WITHOUT PRESENCE OF ULCERS  *URINARY PROBLEMS  *BOWEL PROBLEMS  UNUSUAL RASH Items with * indicate a potential emergency and should be followed up as soon as possible.  Feel free to call the clinic should you have any questions or concerns. The clinic phone number is [phone].  Please show the [facility] at check-in to the Emergency Department and triage nurse.  Coronavirus (COVID-19) Are you at risk?  Are you at risk for the Coronavirus (COVID-19)?  To be considered HIGH RISK for Coronavirus (COVID-19), you have to meet the following criteria:  . Traveled to [place], [place], [place], [place] or [place]; or in the [place] to [place], [place], [place], or [place]; and have fever, cough, and shortness of breath within the last 2 weeks of travel OR . Been in close contact with a person diagnosed with COVID-19 within the last 2 weeks and have fever, cough, and shortness of breath . IF YOU DO NOT MEET THESE CRITERIA, YOU ARE CONSIDERED LOW RISK FOR COVID-19.  What to do if you are HIGH RISK for COVID-19?  Marland Kitchen If you are having a medical emergency, call 911. . Seek medical care right away. Before you go to a doctor's office, urgent care or emergency department, call ahead and tell them  about your recent travel, contact with someone diagnosed with COVID-19, and your symptoms. You should receive instructions from your physician's office regarding next steps of care.  . When you arrive at healthcare provider, tell the healthcare staff immediately you have returned from visiting [place], [place], [place], [place] or [place]; or traveled in the [place] to [place], [place], [place], or [place]; in the last two weeks or you have been in close contact with a person diagnosed with COVID-19 in the last 2 weeks.   . Tell the health care staff about your symptoms: fever, cough and shortness of breath. . After you have been seen by a medical provider, you will be either: o Tested for (COVID-19) and discharged home on quarantine except to seek medical care if symptoms worsen, and asked to  - Stay home and avoid contact with others until you get your results (4-5 days)  - Avoid travel on public transportation if possible (such as bus, train, or airplane) or o Sent to the Emergency Department by EMS for evaluation, COVID-19 testing, and possible admission depending on your condition and test results.  What to do if you are LOW RISK for COVID-19?  Reduce your risk of any infection by using the same precautions used for avoiding the common cold or flu:  Marland Kitchen Wash your hands often with soap and warm water for at least 20 seconds.  If soap and water are not readily available, use an alcohol-based hand sanitizer with at least 60% alcohol.  . If coughing or  sneezing, cover your mouth and nose by coughing or sneezing into the elbow areas of your shirt or coat, into a tissue or into your sleeve (not your hands). . Avoid shaking hands with others and consider head nods or verbal greetings only. . Avoid touching your eyes, nose, or mouth with unwashed hands.  . Avoid close contact with people who are sick. . Avoid places or events with large numbers of people in one location, like concerts or  sporting events. . Carefully consider travel plans you have or are making. . If you are planning any travel outside or inside the [place], visit the CDC's Travelers' Health webpage for the latest health notices. . If you have some symptoms but not all symptoms, continue to monitor at home and seek medical attention if your symptoms worsen. . If you are having a medical emergency, call 911.   [place] / e-Visit: [email or web address]         [facility]: [facility]: [phone]                   [facility]: [phone]

## 2019-11-18 NOTE — Progress Notes (Signed)
Nutrition Follow-up:  Patient with non small cell lung cancer followed by Dr. Earlie Server.    Met with patient during infusion.  Reports that appetite may be a little bit better.  Reports that he ate cereal this am for breakfast.  Last night ate pork chop, shells and cheese and corn.  Yesterday had late breakfast of french toast so did not eat much lunch.  Reports that he has been taking mylanta before he eats and seems to help.      Medications: reviewed  Labs: reviewed  Anthropometrics:   Weight 154 lb today decreased from 157 lb on 11/24.     NUTRITION DIAGNOSIS: Inadequate oral intake continues   INTERVENTION:  Reviewed ways to increase calories and protein. Provided samples of ensure, carnation breakfast essentials and orgain for patient to try as boost upsets stomach at times.   Patient has contact information    MONITORING, EVALUATION, GOAL: Patient will consume adequate calories and protein to prevent weight loss   NEXT VISIT: Jan 19 during infusion  Myria Steenbergen B. Zenia Resides, [place], [place] Registered Dietitian [phone] (pager)

## 2019-11-29 DIAGNOSIS — J439 Emphysema, unspecified: Secondary | ICD-10-CM | POA: Diagnosis not present

## 2019-12-01 ENCOUNTER — Ambulatory Visit (HOSPITAL_COMMUNITY)
Admission: RE | Admit: 2019-12-01 | Discharge: 2019-12-01 | Disposition: A | Discharge status: Home / Self Care | Payer: Medicare HMO | Source: Ambulatory Visit | Attending: Physician Assistant | Admitting: Physician Assistant

## 2019-12-01 ENCOUNTER — Encounter (HOSPITAL_COMMUNITY): Payer: Self-pay

## 2019-12-01 ENCOUNTER — Other Ambulatory Visit: Payer: Self-pay

## 2019-12-01 DIAGNOSIS — C3490 Malignant neoplasm of unspecified part of unspecified bronchus or lung: Secondary | ICD-10-CM | POA: Diagnosis not present

## 2019-12-01 DIAGNOSIS — C3492 Malignant neoplasm of unspecified part of left bronchus or lung: Secondary | ICD-10-CM | POA: Diagnosis not present

## 2019-12-01 MED ORDER — SODIUM CHLORIDE (PF) 0.9 % IJ SOLN
INTRAMUSCULAR | Status: AC
  Filled 2019-12-01: qty 50

## 2019-12-01 MED ORDER — IOHEXOL 300 MG/ML  SOLN
75.0000 mL | Freq: Once | INTRAMUSCULAR | Status: AC | PRN
  Administered 2019-12-01: 75 mL via INTRAVENOUS

## 2019-12-02 ENCOUNTER — Inpatient Hospital Stay: Payer: Medicare HMO

## 2019-12-02 ENCOUNTER — Inpatient Hospital Stay: Payer: Medicare HMO | Attending: Internal Medicine | Admitting: Internal Medicine

## 2019-12-02 ENCOUNTER — Other Ambulatory Visit: Payer: Self-pay

## 2019-12-02 ENCOUNTER — Encounter: Payer: Self-pay | Admitting: Internal Medicine

## 2019-12-02 VITALS — BP 106/51 | HR 95 | Temp 98.2°F | Resp 18 | Ht 68.0 in | Wt 159.3 lb

## 2019-12-02 DIAGNOSIS — J449 Chronic obstructive pulmonary disease, unspecified: Secondary | ICD-10-CM | POA: Insufficient documentation

## 2019-12-02 DIAGNOSIS — I10 Essential (primary) hypertension: Secondary | ICD-10-CM | POA: Diagnosis not present

## 2019-12-02 DIAGNOSIS — C3432 Malignant neoplasm of lower lobe, left bronchus or lung: Secondary | ICD-10-CM | POA: Insufficient documentation

## 2019-12-02 DIAGNOSIS — C3492 Malignant neoplasm of unspecified part of left bronchus or lung: Secondary | ICD-10-CM

## 2019-12-02 DIAGNOSIS — Z79899 Other long term (current) drug therapy: Secondary | ICD-10-CM | POA: Diagnosis not present

## 2019-12-02 DIAGNOSIS — C3412 Malignant neoplasm of upper lobe, left bronchus or lung: Secondary | ICD-10-CM

## 2019-12-02 DIAGNOSIS — Z923 Personal history of irradiation: Secondary | ICD-10-CM | POA: Insufficient documentation

## 2019-12-02 DIAGNOSIS — K219 Gastro-esophageal reflux disease without esophagitis: Secondary | ICD-10-CM | POA: Insufficient documentation

## 2019-12-02 DIAGNOSIS — J9611 Chronic respiratory failure with hypoxia: Secondary | ICD-10-CM | POA: Diagnosis not present

## 2019-12-02 DIAGNOSIS — J9600 Acute respiratory failure, unspecified whether with hypoxia or hypercapnia: Secondary | ICD-10-CM | POA: Diagnosis not present

## 2019-12-02 DIAGNOSIS — Z9981 Dependence on supplemental oxygen: Secondary | ICD-10-CM | POA: Insufficient documentation

## 2019-12-02 DIAGNOSIS — Z95828 Presence of other vascular implants and grafts: Secondary | ICD-10-CM

## 2019-12-02 DIAGNOSIS — J90 Pleural effusion, not elsewhere classified: Secondary | ICD-10-CM | POA: Diagnosis not present

## 2019-12-02 DIAGNOSIS — J439 Emphysema, unspecified: Secondary | ICD-10-CM | POA: Diagnosis not present

## 2019-12-02 DIAGNOSIS — Z5112 Encounter for antineoplastic immunotherapy: Secondary | ICD-10-CM | POA: Diagnosis not present

## 2019-12-02 DIAGNOSIS — R5383 Other fatigue: Secondary | ICD-10-CM | POA: Insufficient documentation

## 2019-12-02 DIAGNOSIS — J432 Centrilobular emphysema: Secondary | ICD-10-CM | POA: Diagnosis not present

## 2019-12-02 DIAGNOSIS — Z9221 Personal history of antineoplastic chemotherapy: Secondary | ICD-10-CM | POA: Insufficient documentation

## 2019-12-02 LAB — CMP (CANCER CENTER ONLY)
ALT: 17 U/L (ref 0–44)
AST: 18 U/L (ref 15–41)
Albumin: 3.9 g/dL (ref 3.5–5.0)
Alkaline Phosphatase: 76 U/L (ref 38–126)
Anion gap: 7 (ref 5–15)
BUN: 17 mg/dL (ref 8–23)
CO2: 23 mmol/L (ref 22–32)
Calcium: 8.6 mg/dL — ABNORMAL LOW (ref 8.9–10.3)
Chloride: 108 mmol/L (ref 98–111)
Creatinine: 1.05 mg/dL (ref 0.61–1.24)
GFR, Est AFR Am: >60 mL/min (ref >60)
GFR, Estimated: >60 mL/min (ref >60)
Glucose, Bld: 190 mg/dL — ABNORMAL HIGH (ref 70–99)
Potassium: 4.3 mmol/L (ref 3.5–5.1)
Sodium: 138 mmol/L (ref 135–145)
Total Bilirubin: 1.1 mg/dL (ref 0.3–1.2)
Total Protein: 6.1 g/dL — ABNORMAL LOW (ref 6.5–8.1)

## 2019-12-02 LAB — CBC WITH DIFFERENTIAL (CANCER CENTER ONLY)
Abs Immature Granulocytes: 0.01 10*3/uL (ref 0.00–0.07)
Basophils Absolute: 0.1 10*3/uL (ref 0.0–0.1)
Basophils Relative: 2 %
Eosinophils Absolute: 0.1 10*3/uL (ref 0.0–0.5)
Eosinophils Relative: 1 %
HCT: 28.2 % — ABNORMAL LOW (ref 39.0–52.0)
Hemoglobin: 9.5 g/dL — ABNORMAL LOW (ref 13.0–17.0)
Immature Granulocytes: 0 %
Lymphocytes Relative: 9 %
Lymphs Abs: 0.5 10*3/uL — ABNORMAL LOW (ref 0.7–4.0)
MCH: 40.6 pg — ABNORMAL HIGH (ref 26.0–34.0)
MCHC: 33.7 g/dL (ref 30.0–36.0)
MCV: 120.5 fL — ABNORMAL HIGH (ref 80.0–100.0)
Monocytes Absolute: 0.3 10*3/uL (ref 0.1–1.0)
Monocytes Relative: 5 %
Neutro Abs: 5 10*3/uL (ref 1.7–7.7)
Neutrophils Relative %: 83 %
Platelet Count: 235 10*3/uL (ref 150–400)
RBC: 2.34 MIL/uL — ABNORMAL LOW (ref 4.22–5.81)
RDW: 15.2 % (ref 11.5–15.5)
WBC Count: 6 10*3/uL (ref 4.0–10.5)
nRBC: 0 % (ref 0.0–0.2)

## 2019-12-02 LAB — TSH: TSH: 1.684 u[IU]/mL (ref 0.320–4.118)

## 2019-12-02 MED ORDER — HEPARIN SOD (PORK) LOCK FLUSH 100 UNIT/ML IV SOLN
500.0000 [IU] | Freq: Once | INTRAVENOUS | Status: AC | PRN
  Administered 2019-12-02: 500 [IU]
  Filled 2019-12-02: qty 5

## 2019-12-02 MED ORDER — SODIUM CHLORIDE 0.9% FLUSH
10.0000 mL | INTRAVENOUS | Status: DC | PRN
  Administered 2019-12-02: 10 mL
  Filled 2019-12-02: qty 10

## 2019-12-02 MED ORDER — SODIUM CHLORIDE 0.9 % IV SOLN
9.5000 mg/kg | Freq: Once | INTRAVENOUS | Status: AC
  Administered 2019-12-02: 740 mg via INTRAVENOUS
  Filled 2019-12-02: qty 4.8

## 2019-12-02 MED ORDER — SODIUM CHLORIDE 0.9 % IV SOLN
Freq: Once | INTRAVENOUS | Status: AC
  Filled 2019-12-02: qty 250

## 2019-12-02 NOTE — Progress Notes (Signed)
[facility] [phone]   [phone]  OFFICE PROGRESS NOTE  [name], MD [address] [place] [address]  DIAGNOSIS: Stage IIIB (T1b, N3, M0) non-small cell lung cancer favoring adenocarcinoma diagnosed in January 2020 and presented with left lower lobe pulmonary nodule in addition to bilateral hilar and subcarinal lymphadenopathy.  Molecular studies by guardant 360 showed no actionable mutations.  PRIOR THERAPY: Concurrent chemoradiation with weekly carboplatin for AUC of 2 and paclitaxel 45 mg/M2. First dose February 4th, 2020. Status post 5 cycles.   CURRENT THERAPY:  Consolidation immunotherapy with Imfinzi 10 mg/KG every 2 weeks, status post 18 cycles.  INTERVAL HISTORY: Robert Dixon 79 y.o. male returns to the clinic today for follow-up visit.  The patient is feeling fine today with no concerning complaints except for the baseline shortness of breath secondary to COPD and he is currently on home oxygen.  He denied having any current chest pain, cough or hemoptysis.  He denied having any recent weight loss or night sweats.  He has no nausea, vomiting, diarrhea or constipation.  He has no headache or visual changes.  He continues to tolerate his treatment with immunotherapy fairly well.  The patient is here today for evaluation with repeat CT scan of the chest before starting cycle #19.  MEDICAL HISTORY: Past Medical History:  Diagnosis Date  . Cancer ([facility])    skin-   . Complication of anesthesia   . COPD (chronic obstructive pulmonary disease) ([facility])   . Dyspnea   . GERD (gastroesophageal reflux disease)   . Hemoptysis 11/20/2018  . History of hiatal hernia   . Hypertension   . Hypoxia 11/20/2018  . 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 Migraine with visual aura   . Non-small cell lung cancer ([facility]) dx'd 11/20/18  . Pneumonia 11/20/2018  . PONV (postoperative nausea and vomiting)   . Pulmonary nodule  11/20/2018    ALLERGIES:  has No Known Allergies.  MEDICATIONS:  Current Outpatient Medications  Medication Sig Dispense Refill  . albuterol (PROVENTIL HFA;VENTOLIN HFA) 108 (90 Base) MCG/ACT inhaler Inhale 2 puffs into the lungs every 6 (six) hours as needed for wheezing or shortness of breath. 1 Inhaler 6  . albuterol (PROVENTIL) (2.5 MG/3ML) 0.083% nebulizer solution Take 3 mLs (2.5 mg total) by nebulization every 4 (four) hours. 360 mL 0  . benazepril (LOTENSIN) 10 MG tablet Take 10 mg by mouth daily.    . Cyanocobalamin (VITAMIN B 12 PO) Take 1 capsule by mouth daily. 2500 mcg    . dimenhyDRINATE (DRAMAMINE) 50 MG tablet Take 50 mg by mouth every 8 (eight) hours as needed for dizziness.     Hunt Oris (IMFINZI IV) Inject into the vein every 14 (fourteen) days.    Marland Kitchen lidocaine-prilocaine (EMLA) cream Apply 1 application topically as needed. 30 g 0  . Multiple Vitamin (MULTIVITAMIN) tablet Take 1 tablet by mouth daily.    . pantoprazole (PROTONIX) 40 MG tablet Take 40 mg by mouth 2 (two) times daily.    . polycarbophil (FIBERCON) 625 MG tablet Take 625 mg by mouth 2 (two) times daily.     . prochlorperazine (COMPAZINE) 10 MG tablet TAKE ONE TABLET BY MOUTH EVERY 6 HOURS AS NEEDED FOR NAUSEA OR VOMITING 30 tablet 0  . tamsulosin (FLOMAX) 0.4 MG CAPS capsule     . traMADol (ULTRAM) 50 MG tablet Take 50 mg by mouth 2 (two) times daily as needed.  No current facility-administered medications for this visit.   Facility-Administered Medications Ordered in Other Visits  Medication Dose Route Frequency Provider Last Rate Last Admin  . sodium chloride flush (NS) 0.9 % injection 10 mL  10 mL Intracatheter PRN [name], MD      . sodium chloride flush (NS) 0.9 % injection 10 mL  10 mL Intracatheter PRN [name], MD   10 mL at 05/20/19 1232    SURGICAL HISTORY:  Past Surgical History:  Procedure Laterality Date  . COLONOSCOPY W/ POLYPECTOMY    . IR IMAGING GUIDED PORT  INSERTION  03/14/2019  . TONSILLECTOMY    . VASECTOMY    . VIDEO BRONCHOSCOPY WITH ENDOBRONCHIAL ULTRASOUND N/A 12/06/2018   Procedure: VIDEO BRONCHOSCOPY WITH ENDOBRONCHIAL ULTRASOUND;  Surgeon: Garner Nash, DO;  Location: [facility] OR;  Service: Thoracic;  Laterality: N/A;    REVIEW OF SYSTEMS:  Constitutional: positive for fatigue Eyes: negative Ears, nose, mouth, throat, and face: negative Respiratory: positive for dyspnea on exertion Cardiovascular: negative Gastrointestinal: negative Genitourinary:negative Integument/breast: negative Hematologic/lymphatic: negative Musculoskeletal:negative Neurological: negative Behavioral/Psych: negative Endocrine: negative Allergic/Immunologic: negative   PHYSICAL EXAMINATION: General appearance: alert, cooperative, fatigued and no distress Head: Normocephalic, without obvious abnormality, atraumatic Neck: no adenopathy, no JVD, supple, symmetrical, trachea midline and thyroid not enlarged, symmetric, no tenderness/mass/nodules Lymph nodes: Cervical, supraclavicular, and axillary nodes normal. Resp: clear to auscultation bilaterally Back: symmetric, no curvature. ROM normal. No CVA tenderness. Cardio: systolic murmur: systolic ejection 3/6, harsh at 2nd right intercostal space GI: soft, non-tender; bowel sounds normal; no masses,  no organomegaly Extremities: extremities normal, atraumatic, no cyanosis or edema Neurologic: Alert and oriented X 3, normal strength and tone. Normal symmetric reflexes. Normal coordination and gait  ECOG PERFORMANCE STATUS: 1 - Symptomatic but completely ambulatory  Blood pressure (!) 106/51, pulse 95, temperature 98.2 F (36.8 C), temperature source Temporal, resp. rate 18, height 5\' 8"  (1.727 m), weight 159 lb 4.8 oz (72.3 kg), SpO2 95 %.  LABORATORY DATA: Lab Results  Component Value Date   WBC 6.0 12/02/2019   HGB 9.5 (L) 12/02/2019   HCT 28.2 (L) 12/02/2019   MCV 120.5 (H) 12/02/2019   PLT 235  12/02/2019      Chemistry      Component Value Date/Time   NA 141 11/18/2019 1045   K 4.8 11/18/2019 1045   CL 109 11/18/2019 1045   CO2 25 11/18/2019 1045   BUN 16 11/18/2019 1045   CREATININE 0.92 11/18/2019 1045      Component Value Date/Time   CALCIUM 8.3 (L) 11/18/2019 1045   ALKPHOS 87 11/18/2019 1045   AST 13 (L) 11/18/2019 1045   ALT 12 11/18/2019 1045   BILITOT 0.8 11/18/2019 1045       RADIOGRAPHIC STUDIES: CT Chest W Contrast  Result Date: 12/01/2019 CLINICAL DATA:  Follow-up lung cancer, chemotherapy and XRT complete, immunotherapy ongoing EXAM: CT CHEST WITH CONTRAST TECHNIQUE: Multidetector CT imaging of the chest was performed during intravenous contrast administration. CONTRAST:  66mL OMNIPAQUE IOHEXOL 300 MG/ML  SOLN COMPARISON:  09/08/2011 FINDINGS: Cardiovascular: The heart is normal in size. Small pericardial effusion. No evidence of thoracic aortic aneurysm. Atherosclerotic calcifications of the aortic arch. Coronary atherosclerosis the LAD and right coronary artery. Right chest port terminates in the lower SVC. Mediastinum/Nodes: No suspicious mediastinal lymphadenopathy. Visualized thyroid is unremarkable. Lungs/Pleura: Radiation changes in the lingula and left lower lobe. Associated volume loss in left hemithorax. Small left pleural effusion, unchanged. Moderate centrilobular and paraseptal emphysematous changes, upper  lung predominant. No suspicious pulmonary nodules. No pneumothorax. Upper Abdomen: Scattered hepatic cysts, unchanged. Bilateral renal cysts, including a 2.7 cm hyperdense/hemorrhagic lesion the medial left upper kidney, unchanged. Musculoskeletal: Moderate compression fracture deformity at T7, mildly progressive. No retropulsion. IMPRESSION: Radiation changes in the left hemithorax, as above. No evidence of recurrent or metastatic disease. Small left pleural and pericardial effusions, unchanged. Moderate compression fracture deformity at T7, mildly  progressive. No retropulsion. Aortic Atherosclerosis (ICD10-I70.0) and Emphysema (ICD10-J43.9). Electronically Signed   By: Julian Hy M.D.   On: 12/01/2019 08:59    ASSESSMENT AND PLAN: This is a very pleasant 79 years old white male diagnosed with stage IIIb non-small cell lung cancer, adenocarcinoma with no actionable mutations  Completed the course of concurrent chemoradiation with weekly carboplatin and paclitaxel status post 5 cycles.  He has partial response to this treatment. The patient is currently undergoing consolidation treatment with immunotherapy with Imfinzi status post 18 cycles.   The patient continues to tolerate his treatment well with no concerning adverse effects. He had repeat CT scan of the chest performed recently.  I personally and independently reviewed the scans and discussed the results with the patient today. His scan showed no concerning findings for disease progression.  He has some increase and the moderate compression fracture deformity at T7. I recommended for the patient to continue his current treatment with immunotherapy and he will proceed with cycle #19 today. For the COPD, he is followed by [name]. For the valvular heart disease he has an appointment with Dr. Percival Spanish in the next 2 weeks.  The patient is a stable from the cancer standpoint and depending on his COPD condition cardiac intervention can be decided. I will see him back for follow-up visit in 2 weeks for reevaluation before the next cycle of his treatment. He was advised to call immediately if he has any concerning symptoms in the interval. The patient voices understanding of current disease status and treatment options and is in agreement with the current care plan.  All questions were answered. The patient knows to call the clinic with any problems, questions or concerns. We can certainly see the patient much sooner if necessary.  Disclaimer: This note was dictated with voice  recognition software. Similar sounding words can inadvertently be transcribed and may not be corrected upon review.

## 2019-12-02 NOTE — Patient Instructions (Signed)

## 2019-12-02 NOTE — Patient Instructions (Signed)
[facility] Discharge Instructions for Patients Receiving Chemotherapy  Today you received the following chemotherapy agents Durvalumab (IMFINZI).  To help prevent nausea and vomiting after your treatment, we encourage you to take your nausea medication as prescribed.   If you develop nausea and vomiting that is not controlled by your nausea medication, call the clinic.   BELOW ARE SYMPTOMS THAT SHOULD BE REPORTED IMMEDIATELY:  *FEVER GREATER THAN 100.5 F  *CHILLS WITH OR WITHOUT FEVER  NAUSEA AND VOMITING THAT IS NOT CONTROLLED WITH YOUR NAUSEA MEDICATION  *UNUSUAL SHORTNESS OF BREATH  *UNUSUAL BRUISING OR BLEEDING  TENDERNESS IN MOUTH AND THROAT WITH OR WITHOUT PRESENCE OF ULCERS  *URINARY PROBLEMS  *BOWEL PROBLEMS  UNUSUAL RASH Items with * indicate a potential emergency and should be followed up as soon as possible.  Feel free to call the clinic should you have any questions or concerns. The clinic phone number is [phone].  Please show the [facility] at check-in to the Emergency Department and triage nurse.  Coronavirus (COVID-19) Are you at risk?  Are you at risk for the Coronavirus (COVID-19)?  To be considered HIGH RISK for Coronavirus (COVID-19), you have to meet the following criteria:  . Traveled to [place], [place], [place], [place] or [place]; or in the [place] to [place], [place], [place], or [place]; and have fever, cough, and shortness of breath within the last 2 weeks of travel OR . Been in close contact with a person diagnosed with COVID-19 within the last 2 weeks and have fever, cough, and shortness of breath . IF YOU DO NOT MEET THESE CRITERIA, YOU ARE CONSIDERED LOW RISK FOR COVID-19.  What to do if you are HIGH RISK for COVID-19?  Marland Kitchen If you are having a medical emergency, call 911. . Seek medical care right away. Before you go to a doctor's office, urgent care or emergency department, call ahead and tell them  about your recent travel, contact with someone diagnosed with COVID-19, and your symptoms. You should receive instructions from your physician's office regarding next steps of care.  . When you arrive at healthcare provider, tell the healthcare staff immediately you have returned from visiting [place], [place], [place], [place] or [place]; or traveled in the [place] to [place], [place], [place], or [place]; in the last two weeks or you have been in close contact with a person diagnosed with COVID-19 in the last 2 weeks.   . Tell the health care staff about your symptoms: fever, cough and shortness of breath. . After you have been seen by a medical provider, you will be either: o Tested for (COVID-19) and discharged home on quarantine except to seek medical care if symptoms worsen, and asked to  - Stay home and avoid contact with others until you get your results (4-5 days)  - Avoid travel on public transportation if possible (such as bus, train, or airplane) or o Sent to the Emergency Department by EMS for evaluation, COVID-19 testing, and possible admission depending on your condition and test results.  What to do if you are LOW RISK for COVID-19?  Reduce your risk of any infection by using the same precautions used for avoiding the common cold or flu:  Marland Kitchen Wash your hands often with soap and warm water for at least 20 seconds.  If soap and water are not readily available, use an alcohol-based hand sanitizer with at least 60% alcohol.  . If coughing or  sneezing, cover your mouth and nose by coughing or sneezing into the elbow areas of your shirt or coat, into a tissue or into your sleeve (not your hands). . Avoid shaking hands with others and consider head nods or verbal greetings only. . Avoid touching your eyes, nose, or mouth with unwashed hands.  . Avoid close contact with people who are sick. . Avoid places or events with large numbers of people in one location, like concerts or  sporting events. . Carefully consider travel plans you have or are making. . If you are planning any travel outside or inside the [place], visit the CDC's Travelers' Health webpage for the latest health notices. . If you have some symptoms but not all symptoms, continue to monitor at home and seek medical attention if your symptoms worsen. . If you are having a medical emergency, call 911.   [facility] / e-Visit: [email or web address]         [facility]: [facility]: [phone]                   [facility]: [phone]

## 2019-12-03 ENCOUNTER — Telehealth: Payer: Self-pay | Admitting: Internal Medicine

## 2019-12-03 ENCOUNTER — Ambulatory Visit (HOSPITAL_COMMUNITY): Payer: Medicare HMO

## 2019-12-03 ENCOUNTER — Other Ambulatory Visit: Payer: Self-pay | Admitting: Cardiology

## 2019-12-03 DIAGNOSIS — I350 Nonrheumatic aortic (valve) stenosis: Secondary | ICD-10-CM

## 2019-12-03 DIAGNOSIS — R1900 Intra-abdominal and pelvic swelling, mass and lump, unspecified site: Secondary | ICD-10-CM

## 2019-12-03 NOTE — Telephone Encounter (Signed)
Scheduled per los. Called and left msg. Mailed prntout

## 2019-12-04 ENCOUNTER — Ambulatory Visit (HOSPITAL_COMMUNITY): Payer: Medicare HMO | Attending: Cardiology

## 2019-12-04 ENCOUNTER — Other Ambulatory Visit: Payer: Self-pay

## 2019-12-04 DIAGNOSIS — I313 Pericardial effusion (noninflammatory): Secondary | ICD-10-CM | POA: Diagnosis not present

## 2019-12-04 DIAGNOSIS — I350 Nonrheumatic aortic (valve) stenosis: Secondary | ICD-10-CM | POA: Insufficient documentation

## 2019-12-04 DIAGNOSIS — I3139 Other pericardial effusion (noninflammatory): Secondary | ICD-10-CM

## 2019-12-10 NOTE — Progress Notes (Signed)
Cardiology Office Note   Date:  12/11/2019   ID:  Robert Dixon, DOB August 22, 1941, MRN [id number]  PCP:  [name], MD  Cardiologist:   No primary care provider on file. Referring:  [name], MD    Chief Complaint  Patient presents with  . Aortic Stenosis      History of Present Illness: Robert Dixon is a 79 y.o. male who is referred by [name], MD for evaluation of aortic stenosis.  He has non-small cell lung cancer.  He has been treated with carboplatin and paclitaxel.  He is on Imfinzi.  He has O2 dependent lung disease and emphysema reported.  He has had a thoracentesis that looked to be inflammatory.  I saw him last time because was found to have pericardial effusion and aortic stenosis on an echocardiogram.  He has well-preserved ejection fraction.  The effusion appears was moderate.  His aortic stenosis looks to be severe with a mean gradient of 37.8.  There is also listed moderate aortic regurgitation.   Echo this month demonstrated a mean gradient of 33.  This was moderate to severe.   The effusion was unchanged and moderate.    I did look at the oncology notes and they thought that his cancer was stable.  He is going to complete his maintenance therapy in March.  He denies any new cardiovascular symptoms.  He has his dyspnea and wears oxygen most of the time.  However, this does not seem like it is particularly changed.  He is not describing any new PND or orthopnea.  He has not been having any new palpitations, presyncope or syncope.  He has had no weight gain or edema.  His systolic blood pressure has been low and he has had his lisinopril reduced not long ago.   Past Medical History:  Diagnosis Date  . Cancer ([facility])    skin-   . Complication of anesthesia   . COPD (chronic obstructive pulmonary disease) ([facility])   . Dyspnea   . GERD (gastroesophageal reflux disease)   . Hemoptysis 11/20/2018  . History of hiatal hernia   . Hypertension   . Hypoxia  11/20/2018  . 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 Migraine with visual aura   . Non-small cell lung cancer ([facility]) dx'd 11/20/18  . Pneumonia 11/20/2018  . PONV (postoperative nausea and vomiting)   . Pulmonary nodule 11/20/2018    Past Surgical History:  Procedure Laterality Date  . COLONOSCOPY W/ POLYPECTOMY    . IR IMAGING GUIDED PORT INSERTION  03/14/2019  . TONSILLECTOMY    . VASECTOMY    . VIDEO BRONCHOSCOPY WITH ENDOBRONCHIAL ULTRASOUND N/A 12/06/2018   Procedure: VIDEO BRONCHOSCOPY WITH ENDOBRONCHIAL ULTRASOUND;  Surgeon: Garner Nash, DO;  Location: [facility] OR;  Service: Thoracic;  Laterality: N/A;     Current Outpatient Medications  Medication Sig Dispense Refill  . albuterol (PROVENTIL HFA;VENTOLIN HFA) 108 (90 Base) MCG/ACT inhaler Inhale 2 puffs into the lungs every 6 (six) hours as needed for wheezing or shortness of breath. 1 Inhaler 6  . albuterol (PROVENTIL) (2.5 MG/3ML) 0.083% nebulizer solution Take 3 mLs (2.5 mg total) by nebulization every 4 (four) hours. 360 mL 0  . Cyanocobalamin (VITAMIN B 12 PO) Take 1 capsule by mouth daily. 2500 mcg    . dimenhyDRINATE (DRAMAMINE) 50 MG tablet Take 50 mg by mouth every 8 (eight) hours as needed for dizziness.     Hunt Oris (IMFINZI IV) Inject into the vein  every 14 (fourteen) days.    Marland Kitchen lidocaine-prilocaine (EMLA) cream Apply 1 application topically as needed. 30 g 0  . Multiple Vitamin (MULTIVITAMIN) tablet Take 1 tablet by mouth daily.    . pantoprazole (PROTONIX) 40 MG tablet Take 40 mg by mouth 2 (two) times daily.    . polycarbophil (FIBERCON) 625 MG tablet Take 625 mg by mouth 2 (two) times daily.     . prochlorperazine (COMPAZINE) 10 MG tablet TAKE ONE TABLET BY MOUTH EVERY 6 HOURS AS NEEDED FOR NAUSEA OR VOMITING 30 tablet 0  . tamsulosin (FLOMAX) 0.4 MG CAPS capsule     . traMADol (ULTRAM) 50 MG tablet Take 50 mg by mouth 2 (two) times daily as needed.     No current facility-administered medications for this  visit.   Facility-Administered Medications Ordered in Other Visits  Medication Dose Route Frequency Provider Last Rate Last Admin  . sodium chloride flush (NS) 0.9 % injection 10 mL  10 mL Intracatheter PRN [name], MD      . sodium chloride flush (NS) 0.9 % injection 10 mL  10 mL Intracatheter PRN [name], MD   10 mL at 05/20/19 1232    Allergies:   Patient has no known allergies.    ROS:  Please see the history of present illness.   Otherwise, review of systems are positive for none.   All other systems are reviewed and negative.    PHYSICAL EXAM: VS:  BP 105/65   Pulse 89   Temp (!) 96 F (35.6 C)   Ht 5' 8.5" (1.74 m)   Wt 156 lb 3.2 oz (70.9 kg)   SpO2 98%   BMI 23.40 kg/m  , BMI Body mass index is 23.4 kg/m. GENERAL:  Well appearing NECK:  No jugular venous distention, waveform within normal limits, carotid upstroke brisk and symmetric, no bruits, no thyromegaly LUNGS:  Clear to auscultation bilaterally CHEST:  Unremarkable HEART:  PMI not displaced or sustained,S1 and S2 within normal limits, no S3, no S4, no clicks, no rubs, 3 out of 6 apical systolic murmur, mid peaking murmurs ABD:  Flat, positive bowel sounds normal in frequency in pitch, no bruits, no rebound, no guarding, no midline pulsatile mass, no hepatomegaly, no splenomegaly EXT:  2 plus pulses throughout, no edema, no cyanosis no clubbing   EKG:  EKG  ordered today. The ekg ordered is demonstrates sinus rhythm, rate 89 axis within normal limits, intervals within normal limits, poor anterior R wave progression.   Recent Labs: 12/02/2019: ALT 17; BUN 17; Creatinine 1.05; Hemoglobin 9.5; Platelet Count 235; Potassium 4.3; Sodium 138; TSH 1.684    Lipid Panel No results found for: CHOL, TRIG, HDL, CHOLHDL, VLDL, LDLCALC, LDLDIRECT    Wt Readings from Last 3 Encounters:  12/11/19 156 lb 3.2 oz (70.9 kg)  12/02/19 159 lb 4.8 oz (72.3 kg)  11/18/19 154 lb 1.6 oz (69.9 kg)      Other  studies Reviewed: Additional studies/ records that were reviewed today include:   Echo, oncology and pulmonary records Review of the above records demonstrates: See elswhere.     ASSESSMENT AND PLAN:  AS:  His AS has not worsened.  I would eventually like to do a right and left heart cath on him but would not schedule this as if he is elected.  At this point I plan on seeing him back in a couple of months and deciding on the timing of this.  There is clearly not an  urgent indication for valve replacement and its not entirely clear that it is necessary.  I would like to get more hemodynamic data.   PERICARDIAL EFFUSION:   This was moderate.   It was unchanged.  No change in therapy.  He did not have a pulses paradoxus today.  I will follow this with a repeat echo in the future.  He is to let me know if he has any sudden shortness of breath, presyncope or syncope or other acute symptoms such as chest discomfort.   HTN: Blood pressures are running low.  I am going to stop his Lotensin.    AAA: He did not have an aneurysm.  That study was done today.  COVID EDUCATION: He is going to check with his doctor to make sure that he is not immunocompromised which might be a contraindication to Covid vaccination.  Otherwise his son will sign him out.  Current medicines are reviewed at length with the patient today.  The patient does not have concerns regarding medicines.  The following changes have been made:  None  Labs/ tests ordered today include:   Orders Placed This Encounter  Procedures  . EKG 12-Lead  . ECHOCARDIOGRAM COMPLETE     Disposition:   FU with me in 2 months.     Signed, Minus Breeding, MD  12/11/2019 12:36 PM    [facility]

## 2019-12-11 ENCOUNTER — Ambulatory Visit: Payer: Medicare HMO | Admitting: Cardiology

## 2019-12-11 ENCOUNTER — Encounter: Payer: Self-pay | Admitting: Internal Medicine

## 2019-12-11 ENCOUNTER — Other Ambulatory Visit: Payer: Self-pay | Admitting: Cardiology

## 2019-12-11 ENCOUNTER — Encounter: Payer: Self-pay | Admitting: Cardiology

## 2019-12-11 ENCOUNTER — Ambulatory Visit (HOSPITAL_COMMUNITY)
Admission: RE | Admit: 2019-12-11 | Discharge: 2019-12-11 | Disposition: A | Discharge status: Home / Self Care | Payer: Medicare HMO | Source: Ambulatory Visit | Attending: Cardiology | Admitting: Cardiology

## 2019-12-11 ENCOUNTER — Other Ambulatory Visit: Payer: Self-pay

## 2019-12-11 VITALS — BP 105/65 | HR 89 | Temp 96.0°F | Ht 68.5 in | Wt 156.2 lb

## 2019-12-11 DIAGNOSIS — I714 Abdominal aortic aneurysm, without rupture, unspecified: Secondary | ICD-10-CM

## 2019-12-11 DIAGNOSIS — I10 Essential (primary) hypertension: Secondary | ICD-10-CM | POA: Diagnosis not present

## 2019-12-11 DIAGNOSIS — I350 Nonrheumatic aortic (valve) stenosis: Secondary | ICD-10-CM

## 2019-12-11 DIAGNOSIS — I313 Pericardial effusion (noninflammatory): Secondary | ICD-10-CM | POA: Diagnosis not present

## 2019-12-11 DIAGNOSIS — I723 Aneurysm of iliac artery: Secondary | ICD-10-CM

## 2019-12-11 DIAGNOSIS — R1900 Intra-abdominal and pelvic swelling, mass and lump, unspecified site: Secondary | ICD-10-CM | POA: Diagnosis not present

## 2019-12-11 DIAGNOSIS — Z7189 Other specified counseling: Secondary | ICD-10-CM

## 2019-12-11 DIAGNOSIS — I3139 Other pericardial effusion (noninflammatory): Secondary | ICD-10-CM

## 2019-12-11 NOTE — Patient Instructions (Addendum)
Medication Instructions:  STOP LOTENSIN *If you need a refill on your cardiac medications before your next appointment, please call your pharmacy*  Lab Work: NONE  Testing/Procedures: Your physician has requested that you have an echocardiogram in 6 months. Echocardiography is a painless test that uses sound waves to create images of your heart. It provides your doctor with information about the size and shape of your heart and how well your heart's chambers and valves are working. This procedure takes approximately one hour. There are no restrictions for this procedure.    Follow-Up: At [facility], you and your health needs are our priority.  As part of our continuing mission to provide you with exceptional heart care, we have created designated Provider Care Teams.  These Care Teams include your primary Cardiologist (physician) and Advanced Practice Providers (APPs -  Physician Assistants and Nurse Practitioners) who all work together to provide you with the care you need, when you need it.  Your next appointment:   2 month(s)  The format for your next appointment:   In Person  Provider:   Minus Breeding, MD

## 2019-12-15 NOTE — Progress Notes (Signed)
[facility] OFFICE PROGRESS NOTE  [name], MD [address] [place] [address]  DIAGNOSIS: Stage IIIB(T1b, N3, M0) non-small cell lung cancer favoring adenocarcinoma diagnosed in January 2020 and presented with left lower lobe pulmonary nodule in addition to bilateral hilar and subcarinal lymphadenopathy  PRIOR THERAPY: Concurrent chemoradiation with weekly carboplatin for an AUC of 2 and paclitaxel 45 mg/m2. Status post 5 cycles.  CURRENT THERAPY: Consolidation immunotherapy with Imfinzi 10 mg/kg IV every 2 weeks. First dose March 25, 2019.Status post19cycles.  INTERVAL HISTORY: Robert Dixon 79 y.o. male returns to the clinic for a follow up visit. The patient is feeling well today without any concerning complaints.  He continues to tolerate his treatment with immunotherapy fairly well without any concerning adverse effects except for fatigue 1 to 2 days following treatment. He denies any recent fever, chills, night sweats, or weight loss. He reports his baseline shortness of breath with exertion which is multifactorial.  He follows closely with pulmonology as well as cardiology for this concern.  He denies any headache or visual changes.  He denies any rashes or skin changes.  He denies any nausea, vomiting, diarrhea, or constipation. He continues to have persistent but stable back discomfort from his compression fracture but states that it has let up recently. He is here today for evaluation before starting cycle #20.  MEDICAL HISTORY: Past Medical History:  Diagnosis Date  . Cancer ([place])    skin-   . Complication of anesthesia   . COPD (chronic obstructive pulmonary disease) ([place])   . Dyspnea   . GERD (gastroesophageal reflux disease)   . Hemoptysis 11/20/2018  . History of hiatal hernia   . Hypertension   . Hypoxia 11/20/2018  . 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 Migraine with visual aura   . Non-small cell lung cancer ([place]) dx'd  11/20/18  . Pneumonia 11/20/2018  . PONV (postoperative nausea and vomiting)   . Pulmonary nodule 11/20/2018    ALLERGIES:  has No Known Allergies.  MEDICATIONS:  Current Outpatient Medications  Medication Sig Dispense Refill  . albuterol (PROVENTIL HFA;VENTOLIN HFA) 108 (90 Base) MCG/ACT inhaler Inhale 2 puffs into the lungs every 6 (six) hours as needed for wheezing or shortness of breath. 1 Inhaler 6  . albuterol (PROVENTIL) (2.5 MG/3ML) 0.083% nebulizer solution Take 3 mLs (2.5 mg total) by nebulization every 4 (four) hours. 360 mL 0  . Cyanocobalamin (VITAMIN B 12 PO) Take 1 capsule by mouth daily. 2500 mcg    . dimenhyDRINATE (DRAMAMINE) 50 MG tablet Take 50 mg by mouth every 8 (eight) hours as needed for dizziness.     Hunt Oris (IMFINZI IV) Inject into the vein every 14 (fourteen) days.    Marland Kitchen lidocaine-prilocaine (EMLA) cream Apply 1 application topically as needed. 30 g 0  . Multiple Vitamin (MULTIVITAMIN) tablet Take 1 tablet by mouth daily.    . pantoprazole (PROTONIX) 40 MG tablet Take 40 mg by mouth 2 (two) times daily.    . polycarbophil (FIBERCON) 625 MG tablet Take 625 mg by mouth 2 (two) times daily.     . prochlorperazine (COMPAZINE) 10 MG tablet TAKE ONE TABLET BY MOUTH EVERY 6 HOURS AS NEEDED FOR NAUSEA OR VOMITING 30 tablet 0  . tamsulosin (FLOMAX) 0.4 MG CAPS capsule     . traMADol (ULTRAM) 50 MG tablet Take 50 mg by mouth 2 (two) times daily as needed.     No current facility-administered medications for this visit.  Facility-Administered Medications Ordered in Other Visits  Medication Dose Route Frequency Provider Last Rate Last Admin  . sodium chloride flush (NS) 0.9 % injection 10 mL  10 mL Intracatheter PRN [name], MD      . sodium chloride flush (NS) 0.9 % injection 10 mL  10 mL Intracatheter PRN [name], MD   10 mL at 05/20/19 1232    SURGICAL HISTORY:  Past Surgical History:  Procedure Laterality Date  . COLONOSCOPY W/ POLYPECTOMY     . IR IMAGING GUIDED PORT INSERTION  03/14/2019  . TONSILLECTOMY    . VASECTOMY    . VIDEO BRONCHOSCOPY WITH ENDOBRONCHIAL ULTRASOUND N/A 12/06/2018   Procedure: VIDEO BRONCHOSCOPY WITH ENDOBRONCHIAL ULTRASOUND;  Surgeon: Garner Nash, DO;  Location: [facility] OR;  Service: Thoracic;  Laterality: N/A;    REVIEW OF SYSTEMS:   Review of Systems  Constitutional:Positive for fatigue.Negative for appetite change, chills, fever and unexpected weight change.  HENT: Negative for mouth sores, nosebleeds, sore throat and trouble swallowing.  Eyes: Negative for eye problems and icterus.  Respiratory:Positive for shortness of breath,Negative for cough, hemoptysis, and wheezing.  Cardiovascular: Negative for chest pain and leg swelling.  Gastrointestinal: Negative for abdominal pain, constipation, diarrhea, nausea and vomiting.  Genitourinary: Negative for bladder incontinence, difficulty urinating, dysuria, frequency and hematuria.  Musculoskeletal: Positive for improved back pain. Negative for gait problem, neck pain and neck stiffness.  Skin: Negative for itching and rash.  Neurological: Negative for dizziness, extremity weakness, gait problem, headaches, light-headedness and seizures.  Hematological: Negative for adenopathy. Does not bruise/bleed easily.  Psychiatric/Behavioral: Negative for confusion, depression and sleep disturbance. The patient is not nervous/anxious.nervous/anxious.    PHYSICAL EXAMINATION:  There were no vitals taken for this visit.  ECOG PERFORMANCE STATUS: 1 - Symptomatic but completely ambulatory  Physical Exam  Constitutional: Oriented to person, place, and time andelderly appearing maleand in no distress.  HENT:  Head: Normocephalic and atraumatic.  Mouth/Throat: Oropharynx is clear and moist. No oropharyngeal exudate.  Eyes: Conjunctivae are normal. Right eye exhibits no discharge. Left eye exhibits no discharge. No scleral icterus.  Neck: Normal range of  motion. Neck supple.  Cardiovascular:Systolic ejection murmur.Normal rate, regular rhythm, and intact distal pulses.  Pulmonary/Chest: Effort normal and breath sounds normal. No respiratory distress. No wheezes. No rales.  Abdominal: Soft. Bowel sounds are normal. Exhibits no distension and no mass. There is no tenderness.  Musculoskeletal: Normal range of motion. Exhibits no edema.  Lymphadenopathy:  No cervical adenopathy.  Neurological: Alert and oriented to person, place, and time. Exhibits normal muscle tone. Gait normal. Coordination normal.  Skin: Skin is warm and dry. No rash noted. Not diaphoretic. No erythema. No pallor.  Psychiatric: Mood, memory and judgment normal.  Vitals reviewed.  LABORATORY DATA: Lab Results  Component Value Date   WBC 6.0 12/02/2019   HGB 9.5 (L) 12/02/2019   HCT 28.2 (L) 12/02/2019   MCV 120.5 (H) 12/02/2019   PLT 235 12/02/2019      Chemistry      Component Value Date/Time   NA 138 12/02/2019 0854   K 4.3 12/02/2019 0854   CL 108 12/02/2019 0854   CO2 23 12/02/2019 0854   BUN 17 12/02/2019 0854   CREATININE 1.05 12/02/2019 0854      Component Value Date/Time   CALCIUM 8.6 (L) 12/02/2019 0854   ALKPHOS 76 12/02/2019 0854   AST 18 12/02/2019 0854   ALT 17 12/02/2019 0854   BILITOT 1.1 12/02/2019 0854  RADIOGRAPHIC STUDIES:  CT Chest W Contrast  Result Date: 12/01/2019 CLINICAL DATA:  Follow-up lung cancer, chemotherapy and XRT complete, immunotherapy ongoing EXAM: CT CHEST WITH CONTRAST TECHNIQUE: Multidetector CT imaging of the chest was performed during intravenous contrast administration. CONTRAST:  63mL OMNIPAQUE IOHEXOL 300 MG/ML  SOLN COMPARISON:  09/08/2011 FINDINGS: Cardiovascular: The heart is normal in size. Small pericardial effusion. No evidence of thoracic aortic aneurysm. Atherosclerotic calcifications of the aortic arch. Coronary atherosclerosis the LAD and right coronary artery. Right chest port terminates in  the lower SVC. Mediastinum/Nodes: No suspicious mediastinal lymphadenopathy. Visualized thyroid is unremarkable. Lungs/Pleura: Radiation changes in the lingula and left lower lobe. Associated volume loss in left hemithorax. Small left pleural effusion, unchanged. Moderate centrilobular and paraseptal emphysematous changes, upper lung predominant. No suspicious pulmonary nodules. No pneumothorax. Upper Abdomen: Scattered hepatic cysts, unchanged. Bilateral renal cysts, including a 2.7 cm hyperdense/hemorrhagic lesion the medial left upper kidney, unchanged. Musculoskeletal: Moderate compression fracture deformity at T7, mildly progressive. No retropulsion. IMPRESSION: Radiation changes in the left hemithorax, as above. No evidence of recurrent or metastatic disease. Small left pleural and pericardial effusions, unchanged. Moderate compression fracture deformity at T7, mildly progressive. No retropulsion. Aortic Atherosclerosis (ICD10-I70.0) and Emphysema (ICD10-J43.9). Electronically Signed   By: Julian Hy M.D.   On: 12/01/2019 08:59   VAS [place] AAA DUPLEX  Result Date: 12/11/2019 ABDOMINAL AORTA STUDY Indications: Pulsatile aorta, history of smoking Risk Factors: Hypertension, past history of smoking. Limitations: Air/bowel gas.  Performing Technologist: Mariane Masters RVT  Examination Guidelines: A complete evaluation includes B-mode imaging, spectral Doppler, color Doppler, and power Doppler as needed of all accessible portions of each vessel. Bilateral testing is considered an integral part of a complete examination. Limited examinations for reoccurring indications may be performed as noted.  Abdominal Aorta Findings: +-------------+-------+----------+----------+---------+--------+---------------+ Location     AP (cm)Trans (cm)PSV (cm/s)Waveform ThrombusComments        +-------------+-------+----------+----------+---------+--------+---------------+ Proximal     2.40   2.20      53         biphasic                         +-------------+-------+----------+----------+---------+--------+---------------+ Mid          1.70   1.70      42        biphasic                         +-------------+-------+----------+----------+---------+--------+---------------+ Distal       2.00   2.00      75        biphasic                         +-------------+-------+----------+----------+---------+--------+---------------+ RT CIA Prox                   208       biphasic         >50% stenosis,                                                           low-end         +-------------+-------+----------+----------+---------+--------+---------------+ RT CIA Mid   1.6    1.6  202       biphasic         ectatic         +-------------+-------+----------+----------+---------+--------+---------------+ RT CIA Distal                 146       triphasic                        +-------------+-------+----------+----------+---------+--------+---------------+ RT EIA Prox  0.9    0.9       134       triphasic                        +-------------+-------+----------+----------+---------+--------+---------------+ LT CIA Prox  1.2    1.2       125       triphasic                        +-------------+-------+----------+----------+---------+--------+---------------+ LT CIA Mid                    178       triphasic                        +-------------+-------+----------+----------+---------+--------+---------------+ LT CIA Distal                 180       triphasic                        +-------------+-------+----------+----------+---------+--------+---------------+ LT EIA Prox  0.8    0.8       184       biphasic                         +-------------+-------+----------+----------+---------+--------+---------------+ The right common iliac artery is tortuous and becomes ectatic in the mid/distal segment. IVC/Iliac Findings:  +--------+------+--------+--------+   IVC   PatentThrombusComments +--------+------+--------+--------+ IVC Proxpatent                 +--------+------+--------+--------+   Summary: Abdominal Aorta: No evidence of an abdominal aortic aneurysm was visualized. 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 The largest aortic measurement is 2.4 cm. Stenosis: +-------------------+-------------+--------+ Location           Stenosis     Comments +-------------------+-------------+--------+ Right Common Iliac >50% stenosislow-end  +-------------------+-------------+--------+ Left Common Iliac  <50% stenosis         +-------------------+-------------+--------+ Left External Iliac<50% stenosis         +-------------------+-------------+--------+   *See table(s) above for measurements and observations. Suggest follow up study in 12 months.  Electronically signed by Ida Rogue MD on 12/11/2019 at 5:49:15 PM.    Final    ECHOCARDIOGRAM COMPLETE  Result Date: 12/05/2019   ECHOCARDIOGRAM REPORT   Patient Name:   ELWOOD BAZINET Date of Exam: 12/04/2019 Medical Rec #:  [id number]      Height:       68.0 in Accession #:    [id number]     Weight:       159.3 lb Date of Birth:  May 16, 1941      BSA:          1.86 m Patient Age:    42 years       BP:           105/67 mmHg  Patient Gender: M              HR:           87 bpm. Exam Location:  [address] Procedure: 2D Echo, Cardiac Doppler and Color Doppler Indications:    I35.0 Aortic Stenosis  History:        Patient has prior history of Echocardiogram examinations, most                 recent 09/24/2019. Signs/Symptoms:Pericardial Effusion and                 Dyspnea; Risk Factors:Hypertension.  Sonographer:    Marygrace Drought RCS Referring Phys: [place]  1. Left ventricular ejection fraction, by visual estimation, is 60 to 65%. The left ventricle has normal function. There is mildly increased left ventricular  hypertrophy.  2. Moderate pericardial effusion, measuring up to 1cm adjacent to RV free wall. Similar to prior echo on 09/24/19, RA inversion is seen, but otherwise no evidence of tamponade, with no RV diastolic collapse seen and IVC is small/collapsible  3. Global right ventricle has normal systolic function.The right ventricular size is normal. No increase in right ventricular wall thickness.  4. Left atrial size was normal.  5. Right atrial size was normal.  6. Moderate mitral annular calcification.  7. The mitral valve is abnormal. Trivial mitral valve regurgitation.  8. The tricuspid valve is normal in structure.  9. The aortic valve is abnormal. Severely calcified. Aortic valve regurgitation is moderate. Moderate to severe aortic valve stenosis 10. The pulmonic valve was not well visualized. Pulmonic valve regurgitation is not visualized. 11. The inferior vena cava is normal in size with greater than 50% respiratory variability, suggesting right atrial pressure of 3 mmHg. 12. The tricuspid regurgitant velocity is 2.90 m/s, and with an assumed right atrial pressure of 3 mmHg, the estimated right ventricular systolic pressure is mildly elevated at 36.6 mmHg. FINDINGS  Left Ventricle: Left ventricular ejection fraction, by visual estimation, is 60 to 65%. The left ventricle has normal function. The left ventricle has no regional wall motion abnormalities. There is mildly increased left ventricular hypertrophy. Normal left atrial pressure. Right Ventricle: The right ventricular size is normal. No increase in right ventricular wall thickness. Global RV systolic function is has normal systolic function. The tricuspid regurgitant velocity is 2.90 m/s, and with an assumed right atrial pressure  of 3 mmHg, the estimated right ventricular systolic pressure is mildly elevated at 36.6 mmHg. Left Atrium: Left atrial size was normal in size. Right Atrium: Right atrial size was normal in size Pericardium: A moderately sized  pericardial effusion is present. Mitral Valve: The mitral valve is abnormal. Moderate mitral annular calcification. Trivial mitral valve regurgitation. Tricuspid Valve: The tricuspid valve is normal in structure. Tricuspid valve regurgitation is trivial. Aortic Valve: The aortic valve is abnormal. Aortic valve regurgitation is moderate. Aortic regurgitation PHT measures 478 msec. Moderate to severe aortic stenosis is present. There is severe calcifcation of the aortic valve. Aortic valve mean gradient measures 33.0 mmHg. Aortic valve peak gradient measures 49.0 mmHg. Aortic valve area, by VTI measures 0.94 cm. Pulmonic Valve: The pulmonic valve was not well visualized. Pulmonic valve regurgitation is not visualized. Pulmonic regurgitation is not visualized. Aorta: The aortic root is normal in size and structure. Venous: The inferior vena cava is normal in size with greater than 50% respiratory variability, suggesting right atrial pressure of 3 mmHg. IAS/Shunts: The interatrial septum was not well visualized.  LEFT VENTRICLE PLAX 2D LVIDd:         3.39 cm  Diastology LVIDs:         2.31 cm  LV e' lateral:   6.09 cm/s LV PW:         1.21 cm  LV E/e' lateral: 19.5 LV IVS:        0.96 cm  LV e' medial:    7.29 cm/s LVOT diam:     2.00 cm  LV E/e' medial:  16.3 LV SV:         29 ml LV SV Index:   15.40 LVOT Area:     3.14 cm  RIGHT VENTRICLE RV Basal diam:  3.46 cm RV S prime:     8.81 cm/s TAPSE (M-mode): 2.4 cm RVSP:           36.6 mmHg LEFT ATRIUM             Index       RIGHT ATRIUM           Index LA diam:        4.10 cm 2.21 cm/m  RA Pressure: 3.00 mmHg LA Vol (A2C):   56.7 ml 30.56 ml/m RA Area:     14.70 cm LA Vol (A4C):   55.5 ml 29.91 ml/m RA Volume:   37.80 ml  20.37 ml/m LA Biplane Vol: 59.4 ml 32.02 ml/m  AORTIC VALVE AV Area (Vmax):    0.84 cm AV Area (Vmean):   0.78 cm AV Area (VTI):     0.94 cm AV Vmax:           350.00 cm/s AV Vmean:          277.000 cm/s AV VTI:            0.760 m AV Peak  Grad:      49.0 mmHg AV Mean Grad:      33.0 mmHg LVOT Vmax:         93.25 cm/s LVOT Vmean:        68.650 cm/s LVOT VTI:          0.227 m LVOT/AV VTI ratio: 0.30 AI PHT:            478 msec  AORTA Ao Root diam: 3.20 cm MITRAL VALVE                         TRICUSPID VALVE MV Area (PHT):                       TR Peak grad:   33.6 mmHg MV PHT:                              TR Vmax:        290.00 cm/s MV Decel Time: 109 msec              Estimated RAP:  3.00 mmHg MV E velocity: 119.00 cm/s 103 cm/s  RVSP:           36.6 mmHg MV A velocity: 157.00 cm/s 70.3 cm/s MV E/A ratio:  0.76        1.5       SHUNTS  Systemic VTI:  0.23 m                                      Systemic Diam: 2.00 cm  Oswaldo Milian MD Electronically signed by Oswaldo Milian MD Signature Date/Time: 12/05/2019/2:59:24 AM    Final      ASSESSMENT/PLAN:  This is a very pleasant 79 year old Caucasian male diagnosed with stage IIIb non-small cell lung cancer, adenocarcinoma of the left lower lobe. He was diagnosed in January 2020. He has no actionable mutations.  The patient underwent concurrent chemoradiation with carboplatin for an AUC of 2 and paclitaxel 45 mg/m. He is status post 5 cycles. He was unable to proceed with cycle #6 secondary to thrombocytopenia.   The patient is currently undergoing consolidation immunotherapy with Imfinzi 10 mg/kg IV every 2 weeks. Status post19cycles.He has been tolerating treatment well without any adverse effects.  Labs were reviewed. We recommend that the patient continue with cycle #20 today as scheduled.   We will see him back for a follow up visit in 2 weeks for evaluation before starting cycle #21.   The patient was advised to call immediately if he has any concerning symptoms in the interval. The patient voices understanding of current disease status and treatment options and is in agreement with the current care plan. All questions were  answered. The patient knows to call the clinic with any problems, questions or concerns. We can certainly see the patient much sooner if necessary  No orders of the defined types were placed in this encounter.    Zaven Klemens L Covey Baller, PA-C 12/15/19

## 2019-12-16 ENCOUNTER — Inpatient Hospital Stay: Payer: Medicare HMO | Admitting: Physician Assistant

## 2019-12-16 ENCOUNTER — Inpatient Hospital Stay: Payer: Medicare HMO

## 2019-12-16 ENCOUNTER — Encounter: Payer: Self-pay | Admitting: Physician Assistant

## 2019-12-16 ENCOUNTER — Other Ambulatory Visit: Payer: Self-pay

## 2019-12-16 VITALS — BP 117/54 | HR 88 | Temp 98.0°F | Resp 18 | Ht 68.0 in | Wt 156.7 lb

## 2019-12-16 DIAGNOSIS — Z5112 Encounter for antineoplastic immunotherapy: Secondary | ICD-10-CM

## 2019-12-16 DIAGNOSIS — R5383 Other fatigue: Secondary | ICD-10-CM | POA: Diagnosis not present

## 2019-12-16 DIAGNOSIS — Z9981 Dependence on supplemental oxygen: Secondary | ICD-10-CM | POA: Diagnosis not present

## 2019-12-16 DIAGNOSIS — K219 Gastro-esophageal reflux disease without esophagitis: Secondary | ICD-10-CM | POA: Diagnosis not present

## 2019-12-16 DIAGNOSIS — Z95828 Presence of other vascular implants and grafts: Secondary | ICD-10-CM

## 2019-12-16 DIAGNOSIS — C3492 Malignant neoplasm of unspecified part of left bronchus or lung: Secondary | ICD-10-CM | POA: Diagnosis not present

## 2019-12-16 DIAGNOSIS — J449 Chronic obstructive pulmonary disease, unspecified: Secondary | ICD-10-CM | POA: Diagnosis not present

## 2019-12-16 DIAGNOSIS — C3432 Malignant neoplasm of lower lobe, left bronchus or lung: Secondary | ICD-10-CM | POA: Diagnosis not present

## 2019-12-16 DIAGNOSIS — I10 Essential (primary) hypertension: Secondary | ICD-10-CM | POA: Diagnosis not present

## 2019-12-16 DIAGNOSIS — Z9221 Personal history of antineoplastic chemotherapy: Secondary | ICD-10-CM | POA: Diagnosis not present

## 2019-12-16 DIAGNOSIS — Z923 Personal history of irradiation: Secondary | ICD-10-CM | POA: Diagnosis not present

## 2019-12-16 LAB — CMP (CANCER CENTER ONLY)
ALT: 11 U/L (ref 0–44)
AST: 14 U/L — ABNORMAL LOW (ref 15–41)
Albumin: 3.9 g/dL (ref 3.5–5.0)
Alkaline Phosphatase: 90 U/L (ref 38–126)
Anion gap: 7 (ref 5–15)
BUN: 17 mg/dL (ref 8–23)
CO2: 23 mmol/L (ref 22–32)
Calcium: 8.2 mg/dL — ABNORMAL LOW (ref 8.9–10.3)
Chloride: 110 mmol/L (ref 98–111)
Creatinine: 0.95 mg/dL (ref 0.61–1.24)
GFR, Est AFR Am: >60 mL/min (ref >60)
GFR, Estimated: >60 mL/min (ref >60)
Glucose, Bld: 105 mg/dL — ABNORMAL HIGH (ref 70–99)
Potassium: 4.5 mmol/L (ref 3.5–5.1)
Sodium: 140 mmol/L (ref 135–145)
Total Bilirubin: 1 mg/dL (ref 0.3–1.2)
Total Protein: 6.2 g/dL — ABNORMAL LOW (ref 6.5–8.1)

## 2019-12-16 LAB — CBC WITH DIFFERENTIAL (CANCER CENTER ONLY)
Abs Immature Granulocytes: 0.03 10*3/uL (ref 0.00–0.07)
Basophils Absolute: 0.1 10*3/uL (ref 0.0–0.1)
Basophils Relative: 2 %
Eosinophils Absolute: 0.1 10*3/uL (ref 0.0–0.5)
Eosinophils Relative: 2 %
HCT: 29.4 % — ABNORMAL LOW (ref 39.0–52.0)
Hemoglobin: 9.8 g/dL — ABNORMAL LOW (ref 13.0–17.0)
Immature Granulocytes: 1 %
Lymphocytes Relative: 15 %
Lymphs Abs: 0.7 10*3/uL (ref 0.7–4.0)
MCH: 41.2 pg — ABNORMAL HIGH (ref 26.0–34.0)
MCHC: 33.3 g/dL (ref 30.0–36.0)
MCV: 123.5 fL — ABNORMAL HIGH (ref 80.0–100.0)
Monocytes Absolute: 0.6 10*3/uL (ref 0.1–1.0)
Monocytes Relative: 12 %
Neutro Abs: 3.4 10*3/uL (ref 1.7–7.7)
Neutrophils Relative %: 68 %
Platelet Count: 253 10*3/uL (ref 150–400)
RBC: 2.38 MIL/uL — ABNORMAL LOW (ref 4.22–5.81)
RDW: 15.6 % — ABNORMAL HIGH (ref 11.5–15.5)
WBC Count: 4.9 10*3/uL (ref 4.0–10.5)
nRBC: 0 % (ref 0.0–0.2)

## 2019-12-16 MED ORDER — SODIUM CHLORIDE 0.9 % IV SOLN
Freq: Once | INTRAVENOUS | Status: AC
  Filled 2019-12-16: qty 250

## 2019-12-16 MED ORDER — SODIUM CHLORIDE 0.9% FLUSH
10.0000 mL | INTRAVENOUS | Status: DC | PRN
  Administered 2019-12-16: 10 mL
  Filled 2019-12-16: qty 10

## 2019-12-16 MED ORDER — SODIUM CHLORIDE 0.9 % IV SOLN
9.5000 mg/kg | Freq: Once | INTRAVENOUS | Status: AC
  Administered 2019-12-16: 740 mg via INTRAVENOUS
  Filled 2019-12-16: qty 10

## 2019-12-16 MED ORDER — HEPARIN SOD (PORK) LOCK FLUSH 100 UNIT/ML IV SOLN
500.0000 [IU] | Freq: Once | INTRAVENOUS | Status: AC | PRN
  Administered 2019-12-16: 500 [IU]
  Filled 2019-12-16: qty 5

## 2019-12-16 NOTE — Patient Instructions (Signed)
[facility] Discharge Instructions for Patients Receiving Chemotherapy  Today you received the following chemotherapy agents: durvalumab.  To help prevent nausea and vomiting after your treatment, we encourage you to take your nausea medication as directed.   If you develop nausea and vomiting that is not controlled by your nausea medication, call the clinic.   BELOW ARE SYMPTOMS THAT SHOULD BE REPORTED IMMEDIATELY:  *FEVER GREATER THAN 100.5 F  *CHILLS WITH OR WITHOUT FEVER  NAUSEA AND VOMITING THAT IS NOT CONTROLLED WITH YOUR NAUSEA MEDICATION  *UNUSUAL SHORTNESS OF BREATH  *UNUSUAL BRUISING OR BLEEDING  TENDERNESS IN MOUTH AND THROAT WITH OR WITHOUT PRESENCE OF ULCERS  *URINARY PROBLEMS  *BOWEL PROBLEMS  UNUSUAL RASH Items with * indicate a potential emergency and should be followed up as soon as possible.  Feel free to call the clinic should you have any questions or concerns. The clinic phone number is [phone].  Please show the CHEMO ALERT CARD at check-in to the Emergency Department and triage nurse.   

## 2019-12-16 NOTE — Progress Notes (Signed)
Nutrition Follow-up:  Patient with non small cell lung cancer followed by Dr Earlie Server.  Met with patient during infusion.  Patient just got finished eating sandwich and chips prior to RD speaking with him.  Reports that he has a good appetite. He has been taking mylanta which has helped improve his appetite.  Has tried the oral nutrition supplement samples that RD gave at last visit and likes them.  Has been eating 3 meals per day.     Medications: reviewed  Labs: reviewed  Anthropometrics:   Weight 156 lb 11.2 oz today increased from 154 lb on 12/22   NUTRITION DIAGNOSIS: Inadequate oral intake continues   INTERVENTION:  Provided more samples of ensure, orgain and carnation breakfast essentials along with coupons today per patient request.  Encouraged patient eating 2 foods at meal times (meat and at least 1 side dish, sandwich and at least one other food, etc)     MONITORING, EVALUATION, GOAL: Patient will consume adequate calories and protein to prevent weight loss   NEXT VISIT: Feb 16th during infusion  Charisse Wendell B. Zenia Resides, [place], [place] Registered Dietitian [phone] (pager)

## 2019-12-18 ENCOUNTER — Telehealth: Payer: Self-pay | Admitting: Internal Medicine

## 2019-12-18 NOTE — Telephone Encounter (Signed)
Scheduled per los. Called and left msg. Mailed printout  °

## 2019-12-23 DIAGNOSIS — J9611 Chronic respiratory failure with hypoxia: Secondary | ICD-10-CM | POA: Diagnosis not present

## 2019-12-23 DIAGNOSIS — J439 Emphysema, unspecified: Secondary | ICD-10-CM | POA: Diagnosis not present

## 2019-12-23 DIAGNOSIS — C3412 Malignant neoplasm of upper lobe, left bronchus or lung: Secondary | ICD-10-CM | POA: Diagnosis not present

## 2019-12-23 DIAGNOSIS — J90 Pleural effusion, not elsewhere classified: Secondary | ICD-10-CM | POA: Diagnosis not present

## 2019-12-23 DIAGNOSIS — J9600 Acute respiratory failure, unspecified whether with hypoxia or hypercapnia: Secondary | ICD-10-CM | POA: Diagnosis not present

## 2019-12-23 DIAGNOSIS — C3492 Malignant neoplasm of unspecified part of left bronchus or lung: Secondary | ICD-10-CM | POA: Diagnosis not present

## 2019-12-23 DIAGNOSIS — J432 Centrilobular emphysema: Secondary | ICD-10-CM | POA: Diagnosis not present

## 2019-12-29 NOTE — Progress Notes (Signed)
[place] OFFICE PROGRESS NOTE  [name], MD [place] [address] [place] [address]  DIAGNOSIS: Stage IIIB(T1b, N3, M0) non-small cell lung cancer favoring adenocarcinoma diagnosed in January 2020 and presented with left lower lobe pulmonary nodule in addition to bilateral hilar and subcarinal lymphadenopathy  PRIOR THERAPY: Concurrent chemoradiation with weekly carboplatin for an AUC of 2 and paclitaxel 45 mg/m2. Status post 5 cycles.  CURRENT THERAPY: Consolidation immunotherapy with Imfinzi 10 mg/kg IV every 2 weeks. First dose March 25, 2019.Status post20cycles.  INTERVAL HISTORY: Robert Dixon 79 y.o. male returns to the clinic for a follow up visit. The patient is feeling well today without any concerning complaints. He continues to tolerate his treatment with immunotherapy fairly well without any concerning adverse effects except for fatigue 1 to 2 days following treatment, and dry skin. He denies any recent fever, chills, night sweats, or weight loss. Patient notes that he has gained a couple of pounds. He reports his baseline shortness of breath with exertion, and chronic cough which is multifactorial. He follows closely with pulmonology as well as cardiology for this concern. He denies any headache or visual changes. He endorses dry skin, but denies any rashes or other skin changes. He denies any nausea, vomiting, diarrhea, or constipation. He is here today for evaluation before starting cycle #21.   MEDICAL HISTORY: Past Medical History:  Diagnosis Date  . Cancer ([place])    skin-   . Complication of anesthesia   . COPD (chronic obstructive pulmonary disease) ([place])   . Dyspnea   . GERD (gastroesophageal reflux disease)   . Hemoptysis 11/20/2018  . History of hiatal hernia   . Hypertension   . Hypoxia 11/20/2018  . 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 Migraine with visual aura   . Non-small cell lung cancer ([place] [place]) dx'd 11/20/18  .  Pneumonia 11/20/2018  . PONV (postoperative nausea and vomiting)   . Pulmonary nodule 11/20/2018    ALLERGIES:  has No Known Allergies.  MEDICATIONS:  Current Outpatient Medications  Medication Sig Dispense Refill  . albuterol (PROVENTIL HFA;VENTOLIN HFA) 108 (90 Base) MCG/ACT inhaler Inhale 2 puffs into the lungs every 6 (six) hours as needed for wheezing or shortness of breath. 1 Inhaler 6  . albuterol (PROVENTIL) (2.5 MG/3ML) 0.083% nebulizer solution Take 3 mLs (2.5 mg total) by nebulization every 4 (four) hours. 360 mL 0  . Cyanocobalamin (VITAMIN B 12 PO) Take 1 capsule by mouth daily. 2500 mcg    . dimenhyDRINATE (DRAMAMINE) 50 MG tablet Take 50 mg by mouth every 8 (eight) hours as needed for dizziness.     Hunt Oris (IMFINZI IV) Inject into the vein every 14 (fourteen) days.    Marland Kitchen lidocaine-prilocaine (EMLA) cream Apply 1 application topically as needed. 30 g 0  . Multiple Vitamin (MULTIVITAMIN) tablet Take 1 tablet by mouth daily.    . pantoprazole (PROTONIX) 40 MG tablet Take 40 mg by mouth 2 (two) times daily.    . polycarbophil (FIBERCON) 625 MG tablet Take 625 mg by mouth 2 (two) times daily.     . prochlorperazine (COMPAZINE) 10 MG tablet TAKE ONE TABLET BY MOUTH EVERY 6 HOURS AS NEEDED FOR NAUSEA OR VOMITING 30 tablet 0  . tamsulosin (FLOMAX) 0.4 MG CAPS capsule     . traMADol (ULTRAM) 50 MG tablet Take 50 mg by mouth 2 (two) times daily as needed.     No current facility-administered medications for this visit.   Facility-Administered Medications  Ordered in Other Visits  Medication Dose Route Frequency Provider Last Rate Last Admin  . sodium chloride flush (NS) 0.9 % injection 10 mL  10 mL Intracatheter PRN [name], MD      . sodium chloride flush (NS) 0.9 % injection 10 mL  10 mL Intracatheter PRN [name], MD   10 mL at 05/20/19 1232    SURGICAL HISTORY:  Past Surgical History:  Procedure Laterality Date  . COLONOSCOPY W/ POLYPECTOMY    . IR  IMAGING GUIDED PORT INSERTION  03/14/2019  . TONSILLECTOMY    . VASECTOMY    . VIDEO BRONCHOSCOPY WITH ENDOBRONCHIAL ULTRASOUND N/A 12/06/2018   Procedure: VIDEO BRONCHOSCOPY WITH ENDOBRONCHIAL ULTRASOUND;  Surgeon: Garner Nash, DO;  Location: [facility] OR;  Service: Thoracic;  Laterality: N/A;    REVIEW OF SYSTEMS:   Review of Systems  Constitutional: Negative for appetite change, chills, fatigue, fever and unexpected weight change.  HENT:   Negative for mouth sores, nosebleeds, sore throat and trouble swallowing.   Eyes: Negative for eye problems and icterus.  Respiratory: Endorses chronic cough. Negative for hemoptysis, worsening shortness of breath and wheezing.   Cardiovascular: Negative for chest pain and leg swelling.  Gastrointestinal: Negative for abdominal pain, constipation, diarrhea, nausea and vomiting.  Genitourinary: Endorses difficulty urinating secondary to BPH. Negative for bladder incontinence, dysuria, frequency and hematuria.   Musculoskeletal: Negative for back pain, gait problem, neck pain and neck stiffness.  Skin: Endorses skin dryness. Negative for itching and rash.  Neurological: Negative for dizziness, extremity weakness, gait problem, headaches, light-headedness and seizures.  Hematological: Negative for adenopathy. Does not bruise/bleed easily.  Psychiatric/Behavioral: Negative for confusion, depression and sleep disturbance. The patient is not nervous/anxious.     PHYSICAL EXAMINATION:  There were no vitals taken for this visit.  ECOG PERFORMANCE STATUS: 1 - Symptomatic but completely ambulatory  Physical Exam  Constitutional: Oriented to person, place, and time and well-developed, well-nourished, and in no distress.  HENT:  Head: Normocephalic and atraumatic.  Mouth/Throat: Oropharynx is clear and moist. No oropharyngeal exudate.  Eyes: Conjunctivae are normal. Right eye exhibits no discharge. Left eye exhibits no discharge. No scleral icterus.  Neck:  Normal range of motion. Neck supple.  Cardiovascular: Systolic murmur heard throughout. Normal rate, regular rhythm, and intact distal pulses.   Pulmonary/Chest: Effort normal and breath sounds normal. No respiratory distress. No wheezes. No rales.  Musculoskeletal: Normal range of motion. Exhibits no edema. In wheelchair. Lymphadenopathy:    No cervical or supraclavicular lymphadenopathy. Neurological: Alert and oriented to person, place, and time. Exhibits normal muscle tone. Gait normal. Coordination normal.  Skin: Skin is warm and dry. No rash noted. Not diaphoretic. No erythema. No pallor.  Psychiatric: Mood, memory and judgment normal.  Vitals reviewed.  LABORATORY DATA: Lab Results  Component Value Date   WBC 4.9 12/16/2019   HGB 9.8 (L) 12/16/2019   HCT 29.4 (L) 12/16/2019   MCV 123.5 (H) 12/16/2019   PLT 253 12/16/2019      Chemistry      Component Value Date/Time   NA 140 12/16/2019 0945   K 4.5 12/16/2019 0945   CL 110 12/16/2019 0945   CO2 23 12/16/2019 0945   BUN 17 12/16/2019 0945   CREATININE 0.95 12/16/2019 0945      Component Value Date/Time   CALCIUM 8.2 (L) 12/16/2019 0945   ALKPHOS 90 12/16/2019 0945   AST 14 (L) 12/16/2019 0945   ALT 11 12/16/2019 0945   BILITOT 1.0 12/16/2019  0945       RADIOGRAPHIC STUDIES:  CT Chest W Contrast  Result Date: 12/01/2019 CLINICAL DATA:  Follow-up lung cancer, chemotherapy and XRT complete, immunotherapy ongoing EXAM: CT CHEST WITH CONTRAST TECHNIQUE: Multidetector CT imaging of the chest was performed during intravenous contrast administration. CONTRAST:  73mL OMNIPAQUE IOHEXOL 300 MG/ML  SOLN COMPARISON:  09/08/2011 FINDINGS: Cardiovascular: The heart is normal in size. Small pericardial effusion. No evidence of thoracic aortic aneurysm. Atherosclerotic calcifications of the aortic arch. Coronary atherosclerosis the LAD and right coronary artery. Right chest port terminates in the lower SVC. Mediastinum/Nodes: No  suspicious mediastinal lymphadenopathy. Visualized thyroid is unremarkable. Lungs/Pleura: Radiation changes in the lingula and left lower lobe. Associated volume loss in left hemithorax. Small left pleural effusion, unchanged. Moderate centrilobular and paraseptal emphysematous changes, upper lung predominant. No suspicious pulmonary nodules. No pneumothorax. Upper Abdomen: Scattered hepatic cysts, unchanged. Bilateral renal cysts, including a 2.7 cm hyperdense/hemorrhagic lesion the medial left upper kidney, unchanged. Musculoskeletal: Moderate compression fracture deformity at T7, mildly progressive. No retropulsion. IMPRESSION: Radiation changes in the left hemithorax, as above. No evidence of recurrent or metastatic disease. Small left pleural and pericardial effusions, unchanged. Moderate compression fracture deformity at T7, mildly progressive. No retropulsion. Aortic Atherosclerosis (ICD10-I70.0) and Emphysema (ICD10-J43.9). Electronically Signed   By: Julian Hy M.D.   On: 12/01/2019 08:59   VAS [place] AAA DUPLEX  Result Date: 12/11/2019 ABDOMINAL AORTA STUDY Indications: Pulsatile aorta, history of smoking Risk Factors: Hypertension, past history of smoking. Limitations: Air/bowel gas.  Performing Technologist: Mariane Masters RVT  Examination Guidelines: A complete evaluation includes B-mode imaging, spectral Doppler, color Doppler, and power Doppler as needed of all accessible portions of each vessel. Bilateral testing is considered an integral part of a complete examination. Limited examinations for reoccurring indications may be performed as noted.  Abdominal Aorta Findings: +-------------+-------+----------+----------+---------+--------+---------------+ Location     AP (cm)Trans (cm)PSV (cm/s)Waveform ThrombusComments        +-------------+-------+----------+----------+---------+--------+---------------+ Proximal     2.40   2.20      53        biphasic                          +-------------+-------+----------+----------+---------+--------+---------------+ Mid          1.70   1.70      42        biphasic                         +-------------+-------+----------+----------+---------+--------+---------------+ Distal       2.00   2.00      75        biphasic                         +-------------+-------+----------+----------+---------+--------+---------------+ RT CIA Prox                   208       biphasic         >50% stenosis,                                                           low-end         +-------------+-------+----------+----------+---------+--------+---------------+ RT CIA Mid   1.6  1.6       202       biphasic         ectatic         +-------------+-------+----------+----------+---------+--------+---------------+ RT CIA Distal                 146       triphasic                        +-------------+-------+----------+----------+---------+--------+---------------+ RT EIA Prox  0.9    0.9       134       triphasic                        +-------------+-------+----------+----------+---------+--------+---------------+ LT CIA Prox  1.2    1.2       125       triphasic                        +-------------+-------+----------+----------+---------+--------+---------------+ LT CIA Mid                    178       triphasic                        +-------------+-------+----------+----------+---------+--------+---------------+ LT CIA Distal                 180       triphasic                        +-------------+-------+----------+----------+---------+--------+---------------+ LT EIA Prox  0.8    0.8       184       biphasic                         +-------------+-------+----------+----------+---------+--------+---------------+ The right common iliac artery is tortuous and becomes ectatic in the mid/distal segment. IVC/Iliac Findings: +--------+------+--------+--------+   IVC    PatentThrombusComments +--------+------+--------+--------+ IVC Proxpatent                 +--------+------+--------+--------+   Summary: Abdominal Aorta: No evidence of an abdominal aortic aneurysm was visualized. 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 The largest aortic measurement is 2.4 cm. Stenosis: +-------------------+-------------+--------+ Location           Stenosis     Comments +-------------------+-------------+--------+ Right Common Iliac >50% stenosislow-end  +-------------------+-------------+--------+ Left Common Iliac  <50% stenosis         +-------------------+-------------+--------+ Left External Iliac<50% stenosis         +-------------------+-------------+--------+   *See table(s) above for measurements and observations. Suggest follow up study in 12 months.  Electronically signed by Ida Rogue MD on 12/11/2019 at 5:49:15 PM.    Final    ECHOCARDIOGRAM COMPLETE  Result Date: 12/05/2019   ECHOCARDIOGRAM REPORT   Patient Name:   Robert Dixon Date of Exam: 12/04/2019 Medical Rec #:  [id number]      Height:       68.0 in Accession #:    [id number]     Weight:       159.3 lb Date of Birth:  1941/07/28      BSA:          1.86 m Patient Age:    70 years       BP:  105/67 mmHg Patient Gender: M              HR:           87 bpm. Exam Location:  [facility] Procedure: 2D Echo, Cardiac Doppler and Color Doppler Indications:    I35.0 Aortic Stenosis  History:        Patient has prior history of Echocardiogram examinations, most                 recent 09/24/2019. Signs/Symptoms:Pericardial Effusion and                 Dyspnea; Risk Factors:Hypertension.  Sonographer:    Marygrace Drought RCS Referring Phys: [place]  1. Left ventricular ejection fraction, by visual estimation, is 60 to 65%. The left ventricle has normal function. There is mildly increased left ventricular hypertrophy.  2. Moderate pericardial  effusion, measuring up to 1cm adjacent to RV free wall. Similar to prior echo on 09/24/19, RA inversion is seen, but otherwise no evidence of tamponade, with no RV diastolic collapse seen and IVC is small/collapsible  3. Global right ventricle has normal systolic function.The right ventricular size is normal. No increase in right ventricular wall thickness.  4. Left atrial size was normal.  5. Right atrial size was normal.  6. Moderate mitral annular calcification.  7. The mitral valve is abnormal. Trivial mitral valve regurgitation.  8. The tricuspid valve is normal in structure.  9. The aortic valve is abnormal. Severely calcified. Aortic valve regurgitation is moderate. Moderate to severe aortic valve stenosis 10. The pulmonic valve was not well visualized. Pulmonic valve regurgitation is not visualized. 11. The inferior vena cava is normal in size with greater than 50% respiratory variability, suggesting right atrial pressure of 3 mmHg. 12. The tricuspid regurgitant velocity is 2.90 m/s, and with an assumed right atrial pressure of 3 mmHg, the estimated right ventricular systolic pressure is mildly elevated at 36.6 mmHg. FINDINGS  Left Ventricle: Left ventricular ejection fraction, by visual estimation, is 60 to 65%. The left ventricle has normal function. The left ventricle has no regional wall motion abnormalities. There is mildly increased left ventricular hypertrophy. Normal left atrial pressure. Right Ventricle: The right ventricular size is normal. No increase in right ventricular wall thickness. Global RV systolic function is has normal systolic function. The tricuspid regurgitant velocity is 2.90 m/s, and with an assumed right atrial pressure  of 3 mmHg, the estimated right ventricular systolic pressure is mildly elevated at 36.6 mmHg. Left Atrium: Left atrial size was normal in size. Right Atrium: Right atrial size was normal in size Pericardium: A moderately sized pericardial effusion is present.  Mitral Valve: The mitral valve is abnormal. Moderate mitral annular calcification. Trivial mitral valve regurgitation. Tricuspid Valve: The tricuspid valve is normal in structure. Tricuspid valve regurgitation is trivial. Aortic Valve: The aortic valve is abnormal. Aortic valve regurgitation is moderate. Aortic regurgitation PHT measures 478 msec. Moderate to severe aortic stenosis is present. There is severe calcifcation of the aortic valve. Aortic valve mean gradient measures 33.0 mmHg. Aortic valve peak gradient measures 49.0 mmHg. Aortic valve area, by VTI measures 0.94 cm. Pulmonic Valve: The pulmonic valve was not well visualized. Pulmonic valve regurgitation is not visualized. Pulmonic regurgitation is not visualized. Aorta: The aortic root is normal in size and structure. Venous: The inferior vena cava is normal in size with greater than 50% respiratory variability, suggesting right atrial pressure of 3 mmHg. IAS/Shunts: The interatrial septum was not  well visualized.  LEFT VENTRICLE PLAX 2D LVIDd:         3.39 cm  Diastology LVIDs:         2.31 cm  LV e' lateral:   6.09 cm/s LV PW:         1.21 cm  LV E/e' lateral: 19.5 LV IVS:        0.96 cm  LV e' medial:    7.29 cm/s LVOT diam:     2.00 cm  LV E/e' medial:  16.3 LV SV:         29 ml LV SV Index:   15.40 LVOT Area:     3.14 cm  RIGHT VENTRICLE RV Basal diam:  3.46 cm RV S prime:     8.81 cm/s TAPSE (M-mode): 2.4 cm RVSP:           36.6 mmHg LEFT ATRIUM             Index       RIGHT ATRIUM           Index LA diam:        4.10 cm 2.21 cm/m  RA Pressure: 3.00 mmHg LA Vol (A2C):   56.7 ml 30.56 ml/m RA Area:     14.70 cm LA Vol (A4C):   55.5 ml 29.91 ml/m RA Volume:   37.80 ml  20.37 ml/m LA Biplane Vol: 59.4 ml 32.02 ml/m  AORTIC VALVE AV Area (Vmax):    0.84 cm AV Area (Vmean):   0.78 cm AV Area (VTI):     0.94 cm AV Vmax:           350.00 cm/s AV Vmean:          277.000 cm/s AV VTI:            0.760 m AV Peak Grad:      49.0 mmHg AV Mean Grad:       33.0 mmHg LVOT Vmax:         93.25 cm/s LVOT Vmean:        68.650 cm/s LVOT VTI:          0.227 m LVOT/AV VTI ratio: 0.30 AI PHT:            478 msec  AORTA Ao Root diam: 3.20 cm MITRAL VALVE                         TRICUSPID VALVE MV Area (PHT):                       TR Peak grad:   33.6 mmHg MV PHT:                              TR Vmax:        290.00 cm/s MV Decel Time: 109 msec              Estimated RAP:  3.00 mmHg MV E velocity: 119.00 cm/s 103 cm/s  RVSP:           36.6 mmHg MV A velocity: 157.00 cm/s 70.3 cm/s MV E/A ratio:  0.76        1.5       SHUNTS  Systemic VTI:  0.23 m                                      Systemic Diam: 2.00 cm  Oswaldo Milian MD Electronically signed by Oswaldo Milian MD Signature Date/Time: 12/05/2019/2:59:24 AM    Final      ASSESSMENT/PLAN:  This is a very pleasant 79 year old Caucasian male diagnosed with stage IIIb non-small cell lung cancer, adenocarcinoma of the left lower lobe. He was diagnosed in January 2020. He has no actionable mutations.  The patient underwent concurrent chemoradiation with carboplatin for an AUC of 2 and paclitaxel 45 mg/m. He is status post 5 cycles. He was unable to proceed with cycle #6 secondary to thrombocytopenia.    The patient is currently undergoing consolidation immunotherapy with Imfinzi 10 mg/kg IV every 2 weeks. Status post20cycles.He has been tolerating treatment well without any adverse effects.  The patient was seen with Dr. Julien Nordmann. Labs were reviewed.We recommendthat the patient continue with cycle #21today as scheduled.  We will see him back for a follow up visit in 2 weeks for evaluation before starting cycle #22.   The patient was advised to call immediately if he has any concerning symptoms in the interval. The patient voices understanding of current disease status and treatment options and is in agreement with the current care plan. All questions  were answered. The patient knows to call the clinic with any problems, questions or concerns. We can certainly see the patient much sooner if necessary  No orders of the defined types were placed in this encounter.    Shantay Sonn L Samora Jernberg, PA-C 12/29/19  ADDENDUM: Hematology/Oncology Attending: I had a face-to-face encounter with the patient today.  I recommended his care plan.  This is a very pleasant 79 years old white male with history of stage IIIb non-small cell lung cancer, adenocarcinoma status post concurrent chemoradiation with partial response.  He is currently undergoing consolidation treatment with immunotherapy with Imfinzi every 2 weeks status post 20 cycles.  The patient has been tolerating the treatment well with no concerning adverse effects. I recommended for him to proceed with cycle #21 today as planned. I will see him back for follow-up visit in 2 weeks for evaluation before the next cycle of his treatment. The patient was advised to call immediately if he has any concerning symptoms in the interval.  Disclaimer: This note was dictated with voice recognition software. Similar sounding words can inadvertently be transcribed and may be missed upon review. Eilleen Kempf, MD 12/30/19

## 2019-12-30 ENCOUNTER — Inpatient Hospital Stay: Payer: Medicare HMO

## 2019-12-30 ENCOUNTER — Encounter: Payer: Self-pay | Admitting: Physician Assistant

## 2019-12-30 ENCOUNTER — Inpatient Hospital Stay: Payer: Medicare HMO | Attending: Internal Medicine | Admitting: Physician Assistant

## 2019-12-30 ENCOUNTER — Other Ambulatory Visit: Payer: Self-pay

## 2019-12-30 VITALS — BP 115/50 | HR 105 | Temp 98.3°F | Resp 17 | Ht 68.0 in | Wt 159.9 lb

## 2019-12-30 VITALS — HR 99

## 2019-12-30 DIAGNOSIS — J439 Emphysema, unspecified: Secondary | ICD-10-CM | POA: Diagnosis not present

## 2019-12-30 DIAGNOSIS — C3432 Malignant neoplasm of lower lobe, left bronchus or lung: Secondary | ICD-10-CM | POA: Insufficient documentation

## 2019-12-30 DIAGNOSIS — C3492 Malignant neoplasm of unspecified part of left bronchus or lung: Secondary | ICD-10-CM

## 2019-12-30 DIAGNOSIS — I10 Essential (primary) hypertension: Secondary | ICD-10-CM | POA: Diagnosis not present

## 2019-12-30 DIAGNOSIS — J449 Chronic obstructive pulmonary disease, unspecified: Secondary | ICD-10-CM | POA: Insufficient documentation

## 2019-12-30 DIAGNOSIS — Z85828 Personal history of other malignant neoplasm of skin: Secondary | ICD-10-CM | POA: Diagnosis not present

## 2019-12-30 DIAGNOSIS — Z923 Personal history of irradiation: Secondary | ICD-10-CM | POA: Insufficient documentation

## 2019-12-30 DIAGNOSIS — Z95828 Presence of other vascular implants and grafts: Secondary | ICD-10-CM

## 2019-12-30 DIAGNOSIS — Z79899 Other long term (current) drug therapy: Secondary | ICD-10-CM | POA: Diagnosis not present

## 2019-12-30 DIAGNOSIS — K219 Gastro-esophageal reflux disease without esophagitis: Secondary | ICD-10-CM | POA: Insufficient documentation

## 2019-12-30 DIAGNOSIS — Z9221 Personal history of antineoplastic chemotherapy: Secondary | ICD-10-CM | POA: Insufficient documentation

## 2019-12-30 DIAGNOSIS — Z5112 Encounter for antineoplastic immunotherapy: Secondary | ICD-10-CM | POA: Insufficient documentation

## 2019-12-30 LAB — CMP (CANCER CENTER ONLY)
ALT: 12 U/L (ref 0–44)
AST: 15 U/L (ref 15–41)
Albumin: 4 g/dL (ref 3.5–5.0)
Alkaline Phosphatase: 93 U/L (ref 38–126)
Anion gap: 7 (ref 5–15)
BUN: 16 mg/dL (ref 8–23)
CO2: 23 mmol/L (ref 22–32)
Calcium: 8.2 mg/dL — ABNORMAL LOW (ref 8.9–10.3)
Chloride: 109 mmol/L (ref 98–111)
Creatinine: 1.08 mg/dL (ref 0.61–1.24)
GFR, Est AFR Am: >60 mL/min (ref >60)
GFR, Estimated: >60 mL/min (ref >60)
Glucose, Bld: 143 mg/dL — ABNORMAL HIGH (ref 70–99)
Potassium: 4 mmol/L (ref 3.5–5.1)
Sodium: 139 mmol/L (ref 135–145)
Total Bilirubin: 0.7 mg/dL (ref 0.3–1.2)
Total Protein: 6.1 g/dL — ABNORMAL LOW (ref 6.5–8.1)

## 2019-12-30 LAB — CBC WITH DIFFERENTIAL (CANCER CENTER ONLY)
Abs Immature Granulocytes: 0.02 10*3/uL (ref 0.00–0.07)
Basophils Absolute: 0.1 10*3/uL (ref 0.0–0.1)
Basophils Relative: 2 %
Eosinophils Absolute: 0.1 10*3/uL (ref 0.0–0.5)
Eosinophils Relative: 1 %
HCT: 29.2 % — ABNORMAL LOW (ref 39.0–52.0)
Hemoglobin: 9.6 g/dL — ABNORMAL LOW (ref 13.0–17.0)
Immature Granulocytes: 0 %
Lymphocytes Relative: 13 %
Lymphs Abs: 0.6 10*3/uL — ABNORMAL LOW (ref 0.7–4.0)
MCH: 40.2 pg — ABNORMAL HIGH (ref 26.0–34.0)
MCHC: 32.9 g/dL (ref 30.0–36.0)
MCV: 122.2 fL — ABNORMAL HIGH (ref 80.0–100.0)
Monocytes Absolute: 0.4 10*3/uL (ref 0.1–1.0)
Monocytes Relative: 9 %
Neutro Abs: 3.6 10*3/uL (ref 1.7–7.7)
Neutrophils Relative %: 75 %
Platelet Count: 231 10*3/uL (ref 150–400)
RBC: 2.39 MIL/uL — ABNORMAL LOW (ref 4.22–5.81)
RDW: 15.6 % — ABNORMAL HIGH (ref 11.5–15.5)
WBC Count: 4.9 10*3/uL (ref 4.0–10.5)
nRBC: 0 % (ref 0.0–0.2)

## 2019-12-30 LAB — TSH: TSH: 1.437 u[IU]/mL (ref 0.320–4.118)

## 2019-12-30 MED ORDER — SODIUM CHLORIDE 0.9 % IV SOLN
Freq: Once | INTRAVENOUS | Status: AC
  Filled 2019-12-30: qty 250

## 2019-12-30 MED ORDER — HEPARIN SOD (PORK) LOCK FLUSH 100 UNIT/ML IV SOLN
500.0000 [IU] | Freq: Once | INTRAVENOUS | Status: AC | PRN
  Administered 2019-12-30: 500 [IU]
  Filled 2019-12-30: qty 5

## 2019-12-30 MED ORDER — SODIUM CHLORIDE 0.9% FLUSH
10.0000 mL | INTRAVENOUS | Status: DC | PRN
  Administered 2019-12-30: 10 mL
  Filled 2019-12-30: qty 10

## 2019-12-30 MED ORDER — SODIUM CHLORIDE 0.9 % IV SOLN
9.5000 mg/kg | Freq: Once | INTRAVENOUS | Status: AC
  Administered 2019-12-30: 740 mg via INTRAVENOUS
  Filled 2019-12-30: qty 4.8

## 2019-12-30 NOTE — Patient Instructions (Signed)
[facility] Discharge Instructions for Patients Receiving Chemotherapy  Today you received the following chemotherapy agents: durvalumab.  To help prevent nausea and vomiting after your treatment, we encourage you to take your nausea medication as directed.   If you develop nausea and vomiting that is not controlled by your nausea medication, call the clinic.   BELOW ARE SYMPTOMS THAT SHOULD BE REPORTED IMMEDIATELY:  *FEVER GREATER THAN 100.5 F  *CHILLS WITH OR WITHOUT FEVER  NAUSEA AND VOMITING THAT IS NOT CONTROLLED WITH YOUR NAUSEA MEDICATION  *UNUSUAL SHORTNESS OF BREATH  *UNUSUAL BRUISING OR BLEEDING  TENDERNESS IN MOUTH AND THROAT WITH OR WITHOUT PRESENCE OF ULCERS  *URINARY PROBLEMS  *BOWEL PROBLEMS  UNUSUAL RASH Items with * indicate a potential emergency and should be followed up as soon as possible.  Feel free to call the clinic should you have any questions or concerns. The clinic phone number is [phone].  Please show the CHEMO ALERT CARD at check-in to the Emergency Department and triage nurse.   

## 2020-01-02 DIAGNOSIS — J432 Centrilobular emphysema: Secondary | ICD-10-CM | POA: Diagnosis not present

## 2020-01-02 DIAGNOSIS — J9600 Acute respiratory failure, unspecified whether with hypoxia or hypercapnia: Secondary | ICD-10-CM | POA: Diagnosis not present

## 2020-01-02 DIAGNOSIS — J90 Pleural effusion, not elsewhere classified: Secondary | ICD-10-CM | POA: Diagnosis not present

## 2020-01-02 DIAGNOSIS — C3412 Malignant neoplasm of upper lobe, left bronchus or lung: Secondary | ICD-10-CM | POA: Diagnosis not present

## 2020-01-02 DIAGNOSIS — C3492 Malignant neoplasm of unspecified part of left bronchus or lung: Secondary | ICD-10-CM | POA: Diagnosis not present

## 2020-01-02 DIAGNOSIS — J439 Emphysema, unspecified: Secondary | ICD-10-CM | POA: Diagnosis not present

## 2020-01-02 DIAGNOSIS — J9611 Chronic respiratory failure with hypoxia: Secondary | ICD-10-CM | POA: Diagnosis not present

## 2020-01-13 ENCOUNTER — Inpatient Hospital Stay: Payer: Medicare HMO

## 2020-01-13 ENCOUNTER — Encounter: Payer: Self-pay | Admitting: Internal Medicine

## 2020-01-13 ENCOUNTER — Inpatient Hospital Stay (HOSPITAL_BASED_OUTPATIENT_CLINIC_OR_DEPARTMENT_OTHER): Payer: Medicare HMO | Admitting: Internal Medicine

## 2020-01-13 ENCOUNTER — Other Ambulatory Visit: Payer: Self-pay

## 2020-01-13 VITALS — BP 116/56 | HR 86 | Temp 97.8°F | Resp 20 | Ht 68.0 in | Wt 162.6 lb

## 2020-01-13 DIAGNOSIS — C3412 Malignant neoplasm of upper lobe, left bronchus or lung: Secondary | ICD-10-CM

## 2020-01-13 DIAGNOSIS — C3492 Malignant neoplasm of unspecified part of left bronchus or lung: Secondary | ICD-10-CM

## 2020-01-13 DIAGNOSIS — K219 Gastro-esophageal reflux disease without esophagitis: Secondary | ICD-10-CM | POA: Diagnosis not present

## 2020-01-13 DIAGNOSIS — Z5112 Encounter for antineoplastic immunotherapy: Secondary | ICD-10-CM

## 2020-01-13 DIAGNOSIS — J449 Chronic obstructive pulmonary disease, unspecified: Secondary | ICD-10-CM | POA: Diagnosis not present

## 2020-01-13 DIAGNOSIS — I10 Essential (primary) hypertension: Secondary | ICD-10-CM | POA: Diagnosis not present

## 2020-01-13 DIAGNOSIS — Z9221 Personal history of antineoplastic chemotherapy: Secondary | ICD-10-CM | POA: Diagnosis not present

## 2020-01-13 DIAGNOSIS — C3432 Malignant neoplasm of lower lobe, left bronchus or lung: Secondary | ICD-10-CM | POA: Diagnosis not present

## 2020-01-13 DIAGNOSIS — Z95828 Presence of other vascular implants and grafts: Secondary | ICD-10-CM

## 2020-01-13 DIAGNOSIS — Z79899 Other long term (current) drug therapy: Secondary | ICD-10-CM | POA: Diagnosis not present

## 2020-01-13 DIAGNOSIS — Z923 Personal history of irradiation: Secondary | ICD-10-CM | POA: Diagnosis not present

## 2020-01-13 DIAGNOSIS — Z85828 Personal history of other malignant neoplasm of skin: Secondary | ICD-10-CM | POA: Diagnosis not present

## 2020-01-13 LAB — CMP (CANCER CENTER ONLY)
ALT: 12 U/L (ref 0–44)
AST: 13 U/L — ABNORMAL LOW (ref 15–41)
Albumin: 3.6 g/dL (ref 3.5–5.0)
Alkaline Phosphatase: 92 U/L (ref 38–126)
Anion gap: 8 (ref 5–15)
BUN: 16 mg/dL (ref 8–23)
CO2: 23 mmol/L (ref 22–32)
Calcium: 8.3 mg/dL — ABNORMAL LOW (ref 8.9–10.3)
Chloride: 110 mmol/L (ref 98–111)
Creatinine: 1.02 mg/dL (ref 0.61–1.24)
GFR, Est AFR Am: >60 mL/min (ref >60)
GFR, Estimated: >60 mL/min (ref >60)
Glucose, Bld: 158 mg/dL — ABNORMAL HIGH (ref 70–99)
Potassium: 4.3 mmol/L (ref 3.5–5.1)
Sodium: 141 mmol/L (ref 135–145)
Total Bilirubin: 0.8 mg/dL (ref 0.3–1.2)
Total Protein: 6 g/dL — ABNORMAL LOW (ref 6.5–8.1)

## 2020-01-13 LAB — CBC WITH DIFFERENTIAL (CANCER CENTER ONLY)
Abs Immature Granulocytes: 0.02 10*3/uL (ref 0.00–0.07)
Basophils Absolute: 0.1 10*3/uL (ref 0.0–0.1)
Basophils Relative: 1 %
Eosinophils Absolute: 0.1 10*3/uL (ref 0.0–0.5)
Eosinophils Relative: 2 %
HCT: 28.7 % — ABNORMAL LOW (ref 39.0–52.0)
Hemoglobin: 9.6 g/dL — ABNORMAL LOW (ref 13.0–17.0)
Immature Granulocytes: 1 %
Lymphocytes Relative: 12 %
Lymphs Abs: 0.5 10*3/uL — ABNORMAL LOW (ref 0.7–4.0)
MCH: 40.7 pg — ABNORMAL HIGH (ref 26.0–34.0)
MCHC: 33.4 g/dL (ref 30.0–36.0)
MCV: 121.6 fL — ABNORMAL HIGH (ref 80.0–100.0)
Monocytes Absolute: 0.5 10*3/uL (ref 0.1–1.0)
Monocytes Relative: 11 %
Neutro Abs: 3.1 10*3/uL (ref 1.7–7.7)
Neutrophils Relative %: 73 %
Platelet Count: 243 10*3/uL (ref 150–400)
RBC: 2.36 MIL/uL — ABNORMAL LOW (ref 4.22–5.81)
RDW: 15.3 % (ref 11.5–15.5)
WBC Count: 4.3 10*3/uL (ref 4.0–10.5)
nRBC: 0 % (ref 0.0–0.2)

## 2020-01-13 MED ORDER — SODIUM CHLORIDE 0.9% FLUSH
10.0000 mL | INTRAVENOUS | Status: DC | PRN
  Administered 2020-01-13: 10 mL
  Filled 2020-01-13: qty 10

## 2020-01-13 MED ORDER — SODIUM CHLORIDE 0.9 % IV SOLN
Freq: Once | INTRAVENOUS | Status: AC
  Filled 2020-01-13: qty 250

## 2020-01-13 MED ORDER — HEPARIN SOD (PORK) LOCK FLUSH 100 UNIT/ML IV SOLN
500.0000 [IU] | Freq: Once | INTRAVENOUS | Status: AC | PRN
  Administered 2020-01-13: 500 [IU]
  Filled 2020-01-13: qty 5

## 2020-01-13 MED ORDER — SODIUM CHLORIDE 0.9 % IV SOLN
740.0000 mg | Freq: Once | INTRAVENOUS | Status: AC
  Administered 2020-01-13: 740 mg via INTRAVENOUS
  Filled 2020-01-13: qty 4.8

## 2020-01-13 NOTE — Progress Notes (Signed)
Nutrition Follow-up:  Patient with non-small cell lung cancer followed by Dr. Earlie Server.  Met with patient during infusion.  Patient reports breakfast is usually V8 juice, banana, coffee, cereal. Lunch is usually a sandwich (peanut butter and jelly, egg salad, tuna) and supper is TV dinner.  Patient reports fatigue after preparing meals.      Medications: reviewed  Labs: reviewed  Anthropometrics:   Weight 162 lb 9.6 oz today increased from 156 lb 11.2 oz on 1/19   NUTRITION DIAGNOSIS: Inadequate ora intake improved   INTERVENTION:  Discussed easy to prepare foods (micowave dinners, pudding, yogurt cups, peanut butter, etc) Encouraged patient to continue eating high calorie, high protein foods    MONITORING, EVALUATION, GOAL: Patient will consume adequate calories and protein to prevent weight loss.  Goals met   NEXT VISIT: no follow-up Patient to call RD if weight declines or appetite decreases  Robert Dixon, [place], [place] Registered Dietitian [phone] (pager)

## 2020-01-13 NOTE — Patient Instructions (Signed)
[facility] Discharge Instructions for Patients Receiving Chemotherapy  Today you received the following Immunotherapy agent: Durvalumab (Imfinzi)  To help prevent nausea and vomiting after your treatment, we encourage you to take your nausea medication as directed by your MD.   If you develop nausea and vomiting that is not controlled by your nausea medication, call the clinic.   BELOW ARE SYMPTOMS THAT SHOULD BE REPORTED IMMEDIATELY:  *FEVER GREATER THAN 100.5 F  *CHILLS WITH OR WITHOUT FEVER  NAUSEA AND VOMITING THAT IS NOT CONTROLLED WITH YOUR NAUSEA MEDICATION  *UNUSUAL SHORTNESS OF BREATH  *UNUSUAL BRUISING OR BLEEDING  TENDERNESS IN MOUTH AND THROAT WITH OR WITHOUT PRESENCE OF ULCERS  *URINARY PROBLEMS  *BOWEL PROBLEMS  UNUSUAL RASH Items with * indicate a potential emergency and should be followed up as soon as possible.  Feel free to call the clinic should you have any questions or concerns. The clinic phone number is [phone].  Please show the [facility] at check-in to the Emergency Department and triage nurse.  Coronavirus (COVID-19) Are you at risk?  Are you at risk for the Coronavirus (COVID-19)?  To be considered HIGH RISK for Coronavirus (COVID-19), you have to meet the following criteria:  . Traveled to [place], [place], [place], [place] or [place]; or in the [place] to [place], [place], [place], or [place]; and have fever, cough, and shortness of breath within the last 2 weeks of travel OR . Been in close contact with a person diagnosed with COVID-19 within the last 2 weeks and have fever, cough, and shortness of breath . IF YOU DO NOT MEET THESE CRITERIA, YOU ARE CONSIDERED LOW RISK FOR COVID-19.  What to do if you are HIGH RISK for COVID-19?  Marland Kitchen If you are having a medical emergency, call 911. . Seek medical care right away. Before you go to a doctor's office, urgent care or emergency department, call ahead and  tell them about your recent travel, contact with someone diagnosed with COVID-19, and your symptoms. You should receive instructions from your physician's office regarding next steps of care.  . When you arrive at healthcare provider, tell the healthcare staff immediately you have returned from visiting [place], [place], [place], [place] or [place]; or traveled in the [place] to [place], [place], [place], or [place]; in the last two weeks or you have been in close contact with a person diagnosed with COVID-19 in the last 2 weeks.   . Tell the health care staff about your symptoms: fever, cough and shortness of breath. . After you have been seen by a medical provider, you will be either: o Tested for (COVID-19) and discharged home on quarantine except to seek medical care if symptoms worsen, and asked to  - Stay home and avoid contact with others until you get your results (4-5 days)  - Avoid travel on public transportation if possible (such as bus, train, or airplane) or o Sent to the Emergency Department by EMS for evaluation, COVID-19 testing, and possible admission depending on your condition and test results.  What to do if you are LOW RISK for COVID-19?  Reduce your risk of any infection by using the same precautions used for avoiding the common cold or flu:  Marland Kitchen Wash your hands often with soap and warm water for at least 20 seconds.  If soap and water are not readily available, use an alcohol-based hand sanitizer with at least 60% alcohol.  Marland Kitchen  If coughing or sneezing, cover your mouth and nose by coughing or sneezing into the elbow areas of your shirt or coat, into a tissue or into your sleeve (not your hands). . Avoid shaking hands with others and consider head nods or verbal greetings only. . Avoid touching your eyes, nose, or mouth with unwashed hands.  . Avoid close contact with people who are sick. . Avoid places or events with large numbers of people in one location, like  concerts or sporting events. . Carefully consider travel plans you have or are making. . If you are planning any travel outside or inside the [place], visit the CDC's Travelers' Health webpage for the latest health notices. . If you have some symptoms but not all symptoms, continue to monitor at home and seek medical attention if your symptoms worsen. . If you are having a medical emergency, call 911.   [place] / e-Visit: [email or web address]         [facility]: [facility]: [phone]                   [facility]: [phone]

## 2020-01-13 NOTE — Progress Notes (Signed)
[facility] [phone]   [phone]  OFFICE PROGRESS NOTE  [name], MD [address] [place] [address]  DIAGNOSIS: Stage IIIB (T1b, N3, M0) non-small cell lung cancer favoring adenocarcinoma diagnosed in January 2020 and presented with left lower lobe pulmonary nodule in addition to bilateral hilar and subcarinal lymphadenopathy.  Molecular studies by guardant 360 showed no actionable mutations.  PRIOR THERAPY: Concurrent chemoradiation with weekly carboplatin for AUC of 2 and paclitaxel 45 mg/M2. First dose February 4th, 2020. Status post 5 cycles.   CURRENT THERAPY:  Consolidation immunotherapy with Imfinzi 10 mg/KG every 2 weeks, status post 21 cycles.  INTERVAL HISTORY: Robert Dixon 79 y.o. male returns to the clinic today for follow-up visit.  The patient is feeling fine today with no concerning complaints except for the baseline shortness of breath and he is currently on home oxygen secondary to COPD.  The patient denied having any chest pain, cough or hemoptysis.  He denied having any fever or chills.  He has no nausea, vomiting, diarrhea or constipation.  He has no headache or visual changes.  He continues to tolerate his treatment with consolidation Imfinzi fairly well.  He is here today for evaluation before starting cycle #22.  MEDICAL HISTORY: Past Medical History:  Diagnosis Date  . Cancer ([place])    skin-   . Complication of anesthesia   . COPD (chronic obstructive pulmonary disease) ([facility])   . Dyspnea   . GERD (gastroesophageal reflux disease)   . Hemoptysis 11/20/2018  . History of hiatal hernia   . Hypertension   . Hypoxia 11/20/2018  . 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 Migraine with visual aura   . Non-small cell lung cancer ([facility]) dx'd 11/20/18  . Pneumonia 11/20/2018  . PONV (postoperative nausea and vomiting)   . Pulmonary nodule 11/20/2018    ALLERGIES:  has No Known  Allergies.  MEDICATIONS:  Current Outpatient Medications  Medication Sig Dispense Refill  . albuterol (PROVENTIL HFA;VENTOLIN HFA) 108 (90 Base) MCG/ACT inhaler Inhale 2 puffs into the lungs every 6 (six) hours as needed for wheezing or shortness of breath. 1 Inhaler 6  . albuterol (PROVENTIL) (2.5 MG/3ML) 0.083% nebulizer solution Take 3 mLs (2.5 mg total) by nebulization every 4 (four) hours. 360 mL 0  . BREZTRI AEROSPHERE 160-9-4.8 MCG/ACT AERO     . Cyanocobalamin (VITAMIN B 12 PO) Take 1 capsule by mouth daily. 2500 mcg    . desonide (DESOWEN) 0.05 % cream Apply 1 application topically 2 (two) times daily.    Marland Kitchen dimenhyDRINATE (DRAMAMINE) 50 MG tablet Take 50 mg by mouth every 8 (eight) hours as needed for dizziness.     Hunt Oris (IMFINZI IV) Inject into the vein every 14 (fourteen) days.    Marland Kitchen EFUDEX 5 % cream 1 application apply on the skin twice a day    . erythromycin with ethanol (EMGEL) 2 % gel Apply 1 application topically 2 (two) times daily.    . hydrocortisone 2.5 % cream     . ketoconazole (NIZORAL) 2 % cream 1 application apply on the skin as directed  1 application apply on the skin as directed One week on and one week off    . lidocaine-prilocaine (EMLA) cream Apply 1 application topically as needed. 30 g 0  . Multiple Vitamin (MULTIVITAMIN) tablet Take 1 tablet by mouth daily.    . pantoprazole (PROTONIX) 40 MG tablet Take 40 mg by mouth  2 (two) times daily.    . polycarbophil (FIBERCON) 625 MG tablet Take 625 mg by mouth 2 (two) times daily.     . prochlorperazine (COMPAZINE) 10 MG tablet TAKE ONE TABLET BY MOUTH EVERY 6 HOURS AS NEEDED FOR NAUSEA OR VOMITING (Patient not taking: Reported on 12/30/2019) 30 tablet 0  . tamsulosin (FLOMAX) 0.4 MG CAPS capsule     . traMADol (ULTRAM) 50 MG tablet Take 50 mg by mouth 2 (two) times daily as needed.     No current facility-administered medications for this visit.   Facility-Administered Medications Ordered in Other Visits   Medication Dose Route Frequency Provider Last Rate Last Admin  . sodium chloride flush (NS) 0.9 % injection 10 mL  10 mL Intracatheter PRN [name], MD      . sodium chloride flush (NS) 0.9 % injection 10 mL  10 mL Intracatheter PRN [name], MD   10 mL at 05/20/19 1232    SURGICAL HISTORY:  Past Surgical History:  Procedure Laterality Date  . COLONOSCOPY W/ POLYPECTOMY    . IR IMAGING GUIDED PORT INSERTION  03/14/2019  . TONSILLECTOMY    . VASECTOMY    . VIDEO BRONCHOSCOPY WITH ENDOBRONCHIAL ULTRASOUND N/A 12/06/2018   Procedure: VIDEO BRONCHOSCOPY WITH ENDOBRONCHIAL ULTRASOUND;  Surgeon: Garner Nash, DO;  Location: [facility] OR;  Service: Thoracic;  Laterality: N/A;    REVIEW OF SYSTEMS:  A comprehensive review of systems was negative except for: Respiratory: positive for dyspnea on exertion   PHYSICAL EXAMINATION: General appearance: alert, cooperative and no distress Head: Normocephalic, without obvious abnormality, atraumatic Neck: no adenopathy, no JVD, supple, symmetrical, trachea midline and thyroid not enlarged, symmetric, no tenderness/mass/nodules Lymph nodes: Cervical, supraclavicular, and axillary nodes normal. Resp: clear to auscultation bilaterally Back: symmetric, no curvature. ROM normal. No CVA tenderness. Cardio: systolic murmur: systolic ejection 3/6, harsh at 2nd right intercostal space GI: soft, non-tender; bowel sounds normal; no masses,  no organomegaly Extremities: extremities normal, atraumatic, no cyanosis or edema  ECOG PERFORMANCE STATUS: 1 - Symptomatic but completely ambulatory  Blood pressure (!) 116/56, pulse 86, temperature 97.8 F (36.6 C), temperature source Temporal, resp. rate 20, height 5\' 8"  (1.727 m), weight 162 lb 9.6 oz (73.8 kg), SpO2 98 %.  LABORATORY DATA: Lab Results  Component Value Date   WBC 4.3 01/13/2020   HGB 9.6 (L) 01/13/2020   HCT 28.7 (L) 01/13/2020   MCV 121.6 (H) 01/13/2020   PLT 243 01/13/2020       Chemistry      Component Value Date/Time   NA 139 12/30/2019 1400   K 4.0 12/30/2019 1400   CL 109 12/30/2019 1400   CO2 23 12/30/2019 1400   BUN 16 12/30/2019 1400   CREATININE 1.08 12/30/2019 1400      Component Value Date/Time   CALCIUM 8.2 (L) 12/30/2019 1400   ALKPHOS 93 12/30/2019 1400   AST 15 12/30/2019 1400   ALT 12 12/30/2019 1400   BILITOT 0.7 12/30/2019 1400       RADIOGRAPHIC STUDIES: No results found.  ASSESSMENT AND PLAN: This is a very pleasant 79 years old white male diagnosed with stage IIIb non-small cell lung cancer, adenocarcinoma with no actionable mutations  Completed the course of concurrent chemoradiation with weekly carboplatin and paclitaxel status post 5 cycles.  He has partial response to this treatment. The patient is currently undergoing consolidation treatment with immunotherapy with Imfinzi status post 21 cycles.   The patient has been tolerating his treatment well  with no concerning adverse effects. I recommended for him to proceed with cycle #22 today as planned. He will come back for follow-up visit in 2 weeks for evaluation before starting cycle #23. For the COPD, he is followed by [name]. For the valvular heart disease he has an appointment with Dr. Percival Spanish in the next 2 weeks.  The patient is a stable from the cancer standpoint and depending on his COPD condition cardiac intervention can be decided. The patient was advised to call immediately if he has any concerning symptoms in the interval. The patient voices understanding of current disease status and treatment options and is in agreement with the current care plan.  All questions were answered. The patient knows to call the clinic with any problems, questions or concerns. We can certainly see the patient much sooner if necessary.  Disclaimer: This note was dictated with voice recognition software. Similar sounding words can inadvertently be transcribed and may not be corrected upon  review.

## 2020-01-14 ENCOUNTER — Telehealth: Payer: Self-pay | Admitting: Internal Medicine

## 2020-01-14 NOTE — Telephone Encounter (Signed)
Scheduled per los. Called and left msg. Mailed printout  °

## 2020-01-18 ENCOUNTER — Ambulatory Visit: Payer: Medicare HMO | Attending: Internal Medicine

## 2020-01-18 DIAGNOSIS — Z23 Encounter for immunization: Secondary | ICD-10-CM

## 2020-01-18 NOTE — Progress Notes (Signed)
   Covid-19 Vaccination Clinic  Name:  Robert Dixon    MRN: [id number] DOB: 08/14/1941  01/18/2020  Robert Dixon was observed post Covid-19 immunization for 15 minutes without incidence. He was provided with Vaccine Information Sheet and instruction to access the V-Safe system.   Robert Dixon was instructed to call 911 with any severe reactions post vaccine: Marland Kitchen Difficulty breathing  . Swelling of your face and throat  . A fast heartbeat  . A bad rash all over your body  . Dizziness and weakness    Immunizations Administered    Name Date Dose VIS Date Route   Pfizer COVID-19 Vaccine 01/18/2020  1:33 PM 0.3 mL 11/07/2019 Intramuscular   Manufacturer: [place]   Lot: [id number]   [place]: [address]-3

## 2020-01-23 DIAGNOSIS — J9600 Acute respiratory failure, unspecified whether with hypoxia or hypercapnia: Secondary | ICD-10-CM | POA: Diagnosis not present

## 2020-01-23 DIAGNOSIS — J9611 Chronic respiratory failure with hypoxia: Secondary | ICD-10-CM | POA: Diagnosis not present

## 2020-01-23 DIAGNOSIS — J439 Emphysema, unspecified: Secondary | ICD-10-CM | POA: Diagnosis not present

## 2020-01-23 DIAGNOSIS — J90 Pleural effusion, not elsewhere classified: Secondary | ICD-10-CM | POA: Diagnosis not present

## 2020-01-23 DIAGNOSIS — J432 Centrilobular emphysema: Secondary | ICD-10-CM | POA: Diagnosis not present

## 2020-01-23 DIAGNOSIS — C3492 Malignant neoplasm of unspecified part of left bronchus or lung: Secondary | ICD-10-CM | POA: Diagnosis not present

## 2020-01-23 DIAGNOSIS — C3412 Malignant neoplasm of upper lobe, left bronchus or lung: Secondary | ICD-10-CM | POA: Diagnosis not present

## 2020-01-25 NOTE — Progress Notes (Signed)
[facility] OFFICE PROGRESS NOTE  [name], MD [address]  [place] [address]  DIAGNOSIS: Stage IIIB(T1b, N3, M0) non-small cell lung cancer favoring adenocarcinoma diagnosed in January 2020 and presented with left lower lobe pulmonary nodule in addition to bilateral hilar and subcarinal lymphadenopathy  PRIOR THERAPY: Concurrent chemoradiation with weekly carboplatin for an AUC of 2 and paclitaxel 45 mg/m2. Status post 5 cycles.  CURRENT THERAPY: Consolidation immunotherapy with Imfinzi 10 mg/kg IV every 2 weeks. First dose March 25, 2019.Status post22cycles.  INTERVAL HISTORY: Robert Dixon 79 y.o. male returns to the clinic for a follow up visit.The patient is feeling well today without any concerning complaints. He recently received his COVID-19 vaccine.He continues to tolerate his treatment with immunotherapy fairly well without any concerning adverse effects except for fatigue 1 to 2 days following treatment and dry skin. He denies any recent fever, chills, night sweats, or weight loss. He reports his baseline shortness of breath with exertion, and chronic cough which is multifactorial. He follows closely with pulmonology as well as cardiology for this concern. He denies any headache or visual changes. He endorses dry skin, but denies any rashes or other skin changes. He denies any nausea, vomiting, diarrhea, or constipation. His last bowel movement was this morning and was of normal color and consistency. He is here today for evaluation before starting cycle #23.   MEDICAL HISTORY: Past Medical History:  Diagnosis Date  . Cancer ([facility])    skin-   . Complication of anesthesia   . COPD (chronic obstructive pulmonary disease) ([facility])   . Dyspnea   . GERD (gastroesophageal reflux disease)   . Hemoptysis 11/20/2018  . History of hiatal hernia   . Hypertension   . Hypoxia 11/20/2018  . Legionnaire's disease ([facility]) 2014  . Migraine with  visual aura   . Non-small cell lung cancer ([facility]) dx'd 11/20/18  . Pneumonia 11/20/2018  . PONV (postoperative nausea and vomiting)   . Pulmonary nodule 11/20/2018    ALLERGIES:  has No Known Allergies.  MEDICATIONS:  Current Outpatient Medications  Medication Sig Dispense Refill  . albuterol (PROVENTIL HFA;VENTOLIN HFA) 108 (90 Base) MCG/ACT inhaler Inhale 2 puffs into the lungs every 6 (six) hours as needed for wheezing or shortness of breath. 1 Inhaler 6  . albuterol (PROVENTIL) (2.5 MG/3ML) 0.083% nebulizer solution Take 3 mLs (2.5 mg total) by nebulization every 4 (four) hours. 360 mL 0  . BREZTRI AEROSPHERE 160-9-4.8 MCG/ACT AERO     . Cyanocobalamin (VITAMIN B 12 PO) Take 1 capsule by mouth daily. 2500 mcg    . desonide (DESOWEN) 0.05 % cream Apply 1 application topically 2 (two) times daily.    Marland Kitchen dimenhyDRINATE (DRAMAMINE) 50 MG tablet Take 50 mg by mouth every 8 (eight) hours as needed for dizziness.     Hunt Oris (IMFINZI IV) Inject into the vein every 14 (fourteen) days.    Marland Kitchen EFUDEX 5 % cream 1 application apply on the skin twice a day    . erythromycin with ethanol (EMGEL) 2 % gel Apply 1 application topically 2 (two) times daily.    . hydrocortisone 2.5 % cream     . ketoconazole (NIZORAL) 2 % cream 1 application apply on the skin as directed  1 application apply on the skin as directed One week on and one week off    . lidocaine-prilocaine (EMLA) cream Apply 1 application topically as needed. 30 g 0  . Multiple Vitamin (MULTIVITAMIN) tablet  Take 1 tablet by mouth daily.    . pantoprazole (PROTONIX) 40 MG tablet Take 40 mg by mouth 2 (two) times daily.    . polycarbophil (FIBERCON) 625 MG tablet Take 625 mg by mouth 2 (two) times daily.     . prochlorperazine (COMPAZINE) 10 MG tablet TAKE ONE TABLET BY MOUTH EVERY 6 HOURS AS NEEDED FOR NAUSEA OR VOMITING 30 tablet 0  . tamsulosin (FLOMAX) 0.4 MG CAPS capsule     . traMADol (ULTRAM) 50 MG tablet Take 50 mg by mouth 2 (two)  times daily as needed.     No current facility-administered medications for this visit.   Facility-Administered Medications Ordered in Other Visits  Medication Dose Route Frequency Provider Last Rate Last Admin  . sodium chloride flush (NS) 0.9 % injection 10 mL  10 mL Intracatheter PRN [name], MD      . sodium chloride flush (NS) 0.9 % injection 10 mL  10 mL Intracatheter PRN [name], MD   10 mL at 05/20/19 1232    SURGICAL HISTORY:  Past Surgical History:  Procedure Laterality Date  . COLONOSCOPY W/ POLYPECTOMY    . IR IMAGING GUIDED PORT INSERTION  03/14/2019  . TONSILLECTOMY    . VASECTOMY    . VIDEO BRONCHOSCOPY WITH ENDOBRONCHIAL ULTRASOUND N/A 12/06/2018   Procedure: VIDEO BRONCHOSCOPY WITH ENDOBRONCHIAL ULTRASOUND;  Surgeon: Garner Nash, DO;  Location: [facility] OR;  Service: Thoracic;  Laterality: N/A;    REVIEW OF SYSTEMS:   Review of Systems  Constitutional: Positive for fatigue. Negative for appetite change, chills, fever and unexpected weight change.  HENT: Negative for mouth sores, nosebleeds, sore throat and trouble swallowing.   Eyes: Negative for eye problems and icterus.  Respiratory: Endorses chronic cough and dyspnea on exertion. Negative for hemoptysis and wheezing.   Cardiovascular: Negative for chest pain and leg swelling.  Gastrointestinal: Negative for abdominal pain, constipation, diarrhea, nausea and vomiting.  Genitourinary: Endorses difficulty urinating secondary to BPH. Negative for bladder incontinence, dysuria, frequency and hematuria.   Musculoskeletal: Negative for back pain, gait problem, neck pain and neck stiffness.  Skin: Endorses skin dryness. Negative for itching and rash.  Neurological: Negative for dizziness, extremity weakness, gait problem, headaches, light-headedness and seizures.  Hematological: Negative for adenopathy. Does not bruise/bleed easily.  Psychiatric/Behavioral: Negative for confusion, depression and sleep  disturbance. The patient is not nervous/anxious.    PHYSICAL EXAMINATION:  Blood pressure (!) 104/42, pulse 91, temperature 97.8 F (36.6 C), temperature source Temporal, resp. rate 17, height 5\' 8"  (1.727 m), weight 161 lb 1.6 oz (73.1 kg), SpO2 100 %.  ECOG PERFORMANCE STATUS: 2 - Symptomatic, <50% confined to bed  Physical Exam  Constitutional: Oriented to person, place, and time and well-developed, well-nourished, and in no distress.  HENT:  Head: Normocephalic and atraumatic.  Mouth/Throat: Oropharynx is clear and moist. No oropharyngeal exudate.  Eyes: Conjunctivae are normal. Right eye exhibits no discharge. Left eye exhibits no discharge. No scleral icterus.  Neck: Normal range of motion. Neck supple.  Cardiovascular: Systolic murmur heard throughout. Normal rate, regular rhythm, and intact distal pulses.   Pulmonary/Chest: Effort normal and breath sounds normal. No respiratory distress. No wheezes. No rales.  Abdominal: Soft. Mild tenderness in RLQ. Bowel sounds are normal. Exhibits no distension and no mass. No rebound tenderness.  Musculoskeletal: Normal range of motion. Exhibits no edema. In wheelchair. Lymphadenopathy:    No cervical or supraclavicular lymphadenopathy. Neurological: Alert and oriented to person, place, and time. Exhibits normal muscle  tone. Gait normal. Coordination normal.  Skin: Skin is warm and dry. No rash noted. Not diaphoretic. No erythema. No pallor.  Psychiatric: Mood, memory and judgment normal.  Vitals reviewed.  LABORATORY DATA: Lab Results  Component Value Date   WBC 5.1 01/27/2020   HGB 10.0 (L) 01/27/2020   HCT 30.2 (L) 01/27/2020   MCV 119.8 (H) 01/27/2020   PLT 235 01/27/2020      Chemistry      Component Value Date/Time   NA 141 01/13/2020 0912   K 4.3 01/13/2020 0912   CL 110 01/13/2020 0912   CO2 23 01/13/2020 0912   BUN 16 01/13/2020 0912   CREATININE 1.02 01/13/2020 0912      Component Value Date/Time   CALCIUM 8.3 (L)  01/13/2020 0912   ALKPHOS 92 01/13/2020 0912   AST 13 (L) 01/13/2020 0912   ALT 12 01/13/2020 0912   BILITOT 0.8 01/13/2020 0912       RADIOGRAPHIC STUDIES:  No results found.   ASSESSMENT/PLAN:  This is a very pleasant 79 year old Caucasian male diagnosed with stage IIIb non-small cell lung cancer, adenocarcinoma of the left lower lobe. He was diagnosed in January 2020. He has no actionable mutations.  The patient underwent concurrent chemoradiation with carboplatin for an AUC of 2 and paclitaxel 45 mg/m. He is status post 5 cycles. He was unable to proceed with cycle #6 secondary to thrombocytopenia.    The patient is currently undergoing consolidation immunotherapy with Imfinzi 10 mg/kg IV every 2 weeks. Status post22cycles.He has been tolerating treatment well without any adverse effects.   Labs were reviewed.We recommendthat the patient continue with cycle #23today as scheduled.  We will see him back for a follow up visit in 2 weeks for evaluation before starting cycle #24.  The patient was advised to call immediately if he has any concerning symptoms in the interval. The patient voices understanding of current disease status and treatment options and is in agreement with the current care plan. All questions were answered. The patient knows to call the clinic with any problems, questions or concerns. We can certainly see the patient much sooner if necessary    No orders of the defined types were placed in this encounter.    Hannibal Skalla L Traveon Louro, PA-C 01/27/20

## 2020-01-27 ENCOUNTER — Inpatient Hospital Stay: Payer: Medicare HMO

## 2020-01-27 ENCOUNTER — Encounter: Payer: Self-pay | Admitting: Physician Assistant

## 2020-01-27 ENCOUNTER — Other Ambulatory Visit: Payer: Self-pay

## 2020-01-27 ENCOUNTER — Inpatient Hospital Stay: Payer: Medicare HMO | Attending: Internal Medicine | Admitting: Physician Assistant

## 2020-01-27 VITALS — BP 104/42 | HR 91 | Temp 97.8°F | Resp 17 | Ht 68.0 in | Wt 161.1 lb

## 2020-01-27 DIAGNOSIS — C3412 Malignant neoplasm of upper lobe, left bronchus or lung: Secondary | ICD-10-CM | POA: Diagnosis not present

## 2020-01-27 DIAGNOSIS — C3492 Malignant neoplasm of unspecified part of left bronchus or lung: Secondary | ICD-10-CM

## 2020-01-27 DIAGNOSIS — R5383 Other fatigue: Secondary | ICD-10-CM | POA: Insufficient documentation

## 2020-01-27 DIAGNOSIS — J439 Emphysema, unspecified: Secondary | ICD-10-CM | POA: Diagnosis not present

## 2020-01-27 DIAGNOSIS — J449 Chronic obstructive pulmonary disease, unspecified: Secondary | ICD-10-CM | POA: Diagnosis not present

## 2020-01-27 DIAGNOSIS — Z5112 Encounter for antineoplastic immunotherapy: Secondary | ICD-10-CM | POA: Insufficient documentation

## 2020-01-27 DIAGNOSIS — R011 Cardiac murmur, unspecified: Secondary | ICD-10-CM | POA: Insufficient documentation

## 2020-01-27 DIAGNOSIS — Z95828 Presence of other vascular implants and grafts: Secondary | ICD-10-CM

## 2020-01-27 DIAGNOSIS — I10 Essential (primary) hypertension: Secondary | ICD-10-CM | POA: Diagnosis not present

## 2020-01-27 DIAGNOSIS — Z79899 Other long term (current) drug therapy: Secondary | ICD-10-CM | POA: Diagnosis not present

## 2020-01-27 DIAGNOSIS — R0609 Other forms of dyspnea: Secondary | ICD-10-CM | POA: Diagnosis not present

## 2020-01-27 DIAGNOSIS — K219 Gastro-esophageal reflux disease without esophagitis: Secondary | ICD-10-CM | POA: Diagnosis not present

## 2020-01-27 LAB — CBC WITH DIFFERENTIAL (CANCER CENTER ONLY)
Abs Immature Granulocytes: 0.02 10*3/uL (ref 0.00–0.07)
Basophils Absolute: 0.1 10*3/uL (ref 0.0–0.1)
Basophils Relative: 1 %
Eosinophils Absolute: 0.1 10*3/uL (ref 0.0–0.5)
Eosinophils Relative: 2 %
HCT: 30.2 % — ABNORMAL LOW (ref 39.0–52.0)
Hemoglobin: 10 g/dL — ABNORMAL LOW (ref 13.0–17.0)
Immature Granulocytes: 0 %
Lymphocytes Relative: 12 %
Lymphs Abs: 0.6 10*3/uL — ABNORMAL LOW (ref 0.7–4.0)
MCH: 39.7 pg — ABNORMAL HIGH (ref 26.0–34.0)
MCHC: 33.1 g/dL (ref 30.0–36.0)
MCV: 119.8 fL — ABNORMAL HIGH (ref 80.0–100.0)
Monocytes Absolute: 0.5 10*3/uL (ref 0.1–1.0)
Monocytes Relative: 10 %
Neutro Abs: 3.8 10*3/uL (ref 1.7–7.7)
Neutrophils Relative %: 75 %
Platelet Count: 235 10*3/uL (ref 150–400)
RBC: 2.52 MIL/uL — ABNORMAL LOW (ref 4.22–5.81)
RDW: 15.2 % (ref 11.5–15.5)
WBC Count: 5.1 10*3/uL (ref 4.0–10.5)
nRBC: 0 % (ref 0.0–0.2)

## 2020-01-27 LAB — CMP (CANCER CENTER ONLY)
ALT: 11 U/L (ref 0–44)
AST: 14 U/L — ABNORMAL LOW (ref 15–41)
Albumin: 3.7 g/dL (ref 3.5–5.0)
Alkaline Phosphatase: 87 U/L (ref 38–126)
Anion gap: 7 (ref 5–15)
BUN: 15 mg/dL (ref 8–23)
CO2: 24 mmol/L (ref 22–32)
Calcium: 8.4 mg/dL — ABNORMAL LOW (ref 8.9–10.3)
Chloride: 111 mmol/L (ref 98–111)
Creatinine: 1.05 mg/dL (ref 0.61–1.24)
GFR, Est AFR Am: >60 mL/min (ref >60)
GFR, Estimated: >60 mL/min (ref >60)
Glucose, Bld: 118 mg/dL — ABNORMAL HIGH (ref 70–99)
Potassium: 4.4 mmol/L (ref 3.5–5.1)
Sodium: 142 mmol/L (ref 135–145)
Total Bilirubin: 0.9 mg/dL (ref 0.3–1.2)
Total Protein: 6.2 g/dL — ABNORMAL LOW (ref 6.5–8.1)

## 2020-01-27 LAB — TSH: TSH: 1.818 u[IU]/mL (ref 0.320–4.118)

## 2020-01-27 MED ORDER — SODIUM CHLORIDE 0.9% FLUSH
10.0000 mL | INTRAVENOUS | Status: DC | PRN
  Administered 2020-01-27: 10 mL
  Filled 2020-01-27: qty 10

## 2020-01-27 MED ORDER — SODIUM CHLORIDE 0.9 % IV SOLN
720.0000 mg | Freq: Once | INTRAVENOUS | Status: DC
  Filled 2020-01-27 (×2): qty 14.4

## 2020-01-27 MED ORDER — HEPARIN SOD (PORK) LOCK FLUSH 100 UNIT/ML IV SOLN
500.0000 [IU] | Freq: Once | INTRAVENOUS | Status: AC | PRN
  Administered 2020-01-27: 500 [IU]
  Filled 2020-01-27: qty 5

## 2020-01-27 MED ORDER — SODIUM CHLORIDE 0.9 % IV SOLN
720.0000 mg | Freq: Once | INTRAVENOUS | Status: AC
  Administered 2020-01-27: 720 mg via INTRAVENOUS
  Filled 2020-01-27: qty 14.4

## 2020-01-27 MED ORDER — SODIUM CHLORIDE 0.9 % IV SOLN
Freq: Once | INTRAVENOUS | Status: AC
  Filled 2020-01-27: qty 250

## 2020-01-27 NOTE — Patient Instructions (Signed)

## 2020-01-27 NOTE — Patient Instructions (Signed)
[facility] Discharge Instructions for Patients Receiving Chemotherapy  Today you received the following chemotherapy agents: durvalumab.  To help prevent nausea and vomiting after your treatment, we encourage you to take your nausea medication as directed.   If you develop nausea and vomiting that is not controlled by your nausea medication, call the clinic.   BELOW ARE SYMPTOMS THAT SHOULD BE REPORTED IMMEDIATELY:  *FEVER GREATER THAN 100.5 F  *CHILLS WITH OR WITHOUT FEVER  NAUSEA AND VOMITING THAT IS NOT CONTROLLED WITH YOUR NAUSEA MEDICATION  *UNUSUAL SHORTNESS OF BREATH  *UNUSUAL BRUISING OR BLEEDING  TENDERNESS IN MOUTH AND THROAT WITH OR WITHOUT PRESENCE OF ULCERS  *URINARY PROBLEMS  *BOWEL PROBLEMS  UNUSUAL RASH Items with * indicate a potential emergency and should be followed up as soon as possible.  Feel free to call the clinic should you have any questions or concerns. The clinic phone number is [phone].  Please show the CHEMO ALERT CARD at check-in to the Emergency Department and triage nurse.   

## 2020-01-28 ENCOUNTER — Telehealth: Payer: Self-pay | Admitting: Physician Assistant

## 2020-01-28 NOTE — Telephone Encounter (Signed)
Scheduled appts per 3/3 los. Put in appt notes for pt to get an updated calender at next appt

## 2020-01-30 DIAGNOSIS — C3412 Malignant neoplasm of upper lobe, left bronchus or lung: Secondary | ICD-10-CM | POA: Diagnosis not present

## 2020-01-30 DIAGNOSIS — J9611 Chronic respiratory failure with hypoxia: Secondary | ICD-10-CM | POA: Diagnosis not present

## 2020-01-30 DIAGNOSIS — C3492 Malignant neoplasm of unspecified part of left bronchus or lung: Secondary | ICD-10-CM | POA: Diagnosis not present

## 2020-01-30 DIAGNOSIS — J90 Pleural effusion, not elsewhere classified: Secondary | ICD-10-CM | POA: Diagnosis not present

## 2020-01-30 DIAGNOSIS — J432 Centrilobular emphysema: Secondary | ICD-10-CM | POA: Diagnosis not present

## 2020-01-30 DIAGNOSIS — J439 Emphysema, unspecified: Secondary | ICD-10-CM | POA: Diagnosis not present

## 2020-01-30 DIAGNOSIS — J9600 Acute respiratory failure, unspecified whether with hypoxia or hypercapnia: Secondary | ICD-10-CM | POA: Diagnosis not present

## 2020-02-04 ENCOUNTER — Other Ambulatory Visit: Payer: Self-pay | Admitting: Emergency Medicine

## 2020-02-04 MED ORDER — ALBUTEROL SULFATE (2.5 MG/3ML) 0.083% IN NEBU: 2.5000 mg | INHALATION_SOLUTION | RESPIRATORY_TRACT | 1 refills | Status: DC

## 2020-02-04 NOTE — Progress Notes (Addendum)
Cardiology Office Note   Date:  02/05/2020   ID:  Robert Dixon, DOB 1941/08/30, MRN [id number]  PCP:  [name], MD  Cardiologist:   Minus Breeding, MD Referring:  [name], MD    No chief complaint on file.     History of Present Illness: Robert Dixon is a 79 y.o. male who is referred by [name], MD for evaluation of aortic stenosis.  He has non-small cell lung cancer.  He has been treated with carboplatin and paclitaxel.  He is on Imfinzi.  He has O2 dependent lung disease and emphysema reported.  He has had a thoracentesis that looked to be inflammatory.  I saw him last time because was found to have pericardial effusion and aortic stenosis on an echocardiogram.  He has well-preserved ejection fraction.  The effusion appears was moderate.  His aortic stenosis looks to be severe with a mean gradient of 37.8.  There is also listed moderate aortic regurgitation.   Echo in Jan demonstrated a mean gradient of 33.  This was moderate to severe.   The effusion was unchanged and moderate.   At the last visit I stopped lisinopril because his BP was running low.  He came back today to discuss follow-up of his pericardial effusion.  I have scheduled echo in July.  We also going to talk about his aortic stenosis and whether he would want anything done to further evaluate this such as right heart cath and left arm with pressures.  He is finishing maintenance therapy in April on his cancer and apparently its been stable as described in my previous note.  He is O2 dependent and has COPD.  He denies any new symptoms.  He short of breath with mild activity.  He denies chest pressure, neck or arm discomfort.  He said no new palpitations, presyncope or syncope.  Past Medical History:  Diagnosis Date  . Cancer ([place])    skin-   . Complication of anesthesia   . COPD (chronic obstructive pulmonary disease) ([place])   . Dyspnea   . GERD (gastroesophageal reflux disease)   .  Hemoptysis 11/20/2018  . History of hiatal hernia   . Hypertension   . Hypoxia 11/20/2018  . 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 Migraine with visual aura   . Non-small cell lung cancer ([place]) dx'd 11/20/18  . Pneumonia 11/20/2018  . PONV (postoperative nausea and vomiting)   . Pulmonary nodule 11/20/2018    Past Surgical History:  Procedure Laterality Date  . COLONOSCOPY W/ POLYPECTOMY    . IR IMAGING GUIDED PORT INSERTION  03/14/2019  . TONSILLECTOMY    . VASECTOMY    . VIDEO BRONCHOSCOPY WITH ENDOBRONCHIAL ULTRASOUND N/A 12/06/2018   Procedure: VIDEO BRONCHOSCOPY WITH ENDOBRONCHIAL ULTRASOUND;  Surgeon: Garner Nash, DO;  Location: [facility] OR;  Service: Thoracic;  Laterality: N/A;     Current Outpatient Medications  Medication Sig Dispense Refill  . albuterol (PROVENTIL HFA;VENTOLIN HFA) 108 (90 Base) MCG/ACT inhaler Inhale 2 puffs into the lungs every 6 (six) hours as needed for wheezing or shortness of breath. 1 Inhaler 6  . albuterol (PROVENTIL) (2.5 MG/3ML) 0.083% nebulizer solution Take 3 mLs (2.5 mg total) by nebulization every 4 (four) hours. 360 mL 1  . BREZTRI AEROSPHERE 160-9-4.8 MCG/ACT AERO     . Cyanocobalamin (VITAMIN B 12 PO) Take 1 capsule by mouth daily. 2500 mcg    . desonide (DESOWEN) 0.05 % cream Apply 1 application topically 2 (two)  times daily.    Marland Kitchen dimenhyDRINATE (DRAMAMINE) 50 MG tablet Take 50 mg by mouth every 8 (eight) hours as needed for dizziness.     Hunt Oris (IMFINZI IV) Inject into the vein every 14 (fourteen) days.    Marland Kitchen EFUDEX 5 % cream 1 application apply on the skin twice a day    . erythromycin with ethanol (EMGEL) 2 % gel Apply 1 application topically 2 (two) times daily.    . hydrocortisone 2.5 % cream     . ketoconazole (NIZORAL) 2 % cream 1 application apply on the skin as directed  1 application apply on the skin as directed One week on and one week off    . lidocaine-prilocaine (EMLA) cream Apply 1 application topically as needed. 30 g  0  . Multiple Vitamin (MULTIVITAMIN) tablet Take 1 tablet by mouth daily.    . pantoprazole (PROTONIX) 40 MG tablet Take 40 mg by mouth 2 (two) times daily.    . polycarbophil (FIBERCON) 625 MG tablet Take 625 mg by mouth 2 (two) times daily.     . prochlorperazine (COMPAZINE) 10 MG tablet TAKE ONE TABLET BY MOUTH EVERY 6 HOURS AS NEEDED FOR NAUSEA OR VOMITING 30 tablet 0  . tamsulosin (FLOMAX) 0.4 MG CAPS capsule     . traMADol (ULTRAM) 50 MG tablet Take 50 mg by mouth 2 (two) times daily as needed.     No current facility-administered medications for this visit.   Facility-Administered Medications Ordered in Other Visits  Medication Dose Route Frequency Provider Last Rate Last Admin  . sodium chloride flush (NS) 0.9 % injection 10 mL  10 mL Intracatheter PRN [name], MD      . sodium chloride flush (NS) 0.9 % injection 10 mL  10 mL Intracatheter PRN [name], MD   10 mL at 05/20/19 1232    Allergies:   Patient has no known allergies.    ROS:  Please see the history of present illness.   Otherwise, review of systems are positive for weakness.   All other systems are reviewed and negative.    PHYSICAL EXAM: VS:  BP 104/60   Pulse 99   Temp 98.1 F (36.7 C)   Ht 5\' 8"  (1.727 m)   Wt 161 lb 9.6 oz (73.3 kg)   SpO2 92%   BMI 24.57 kg/m  , BMI Body mass index is 24.57 kg/m. GEN:  No distress, frail appearing NECK:  No jugular venous distention at 90 degrees, waveform within normal limits, carotid upstroke brisk and symmetric, no bruits, no thyromegaly LUNGS: Decreased breath sounds bilaterally BACK:  No CVA tenderness CHEST:  Unremarkable HEART:  S1 and S2 within normal limits, no S3, no S4, no clicks, no rubs, 3 of 6 apical systolic mid peaking murmur radiating out aortic outflow tract, no diastolic murmurs ABD:  Positive bowel sounds normal in frequency in pitch, no bruits, no rebound, no guarding, unable to assess midline mass or bruit with the patient  seated. EXT:  2 plus pulses throughout, no edema, no cyanosis no clubbing    EKG:  EKG is ordered today. The ekg ordered is demonstrates sinus rhythm, rate 64 axis within normal limits, intervals within normal limits, poor anterior R wave progression.   Recent Labs: 01/27/2020: ALT 11; BUN 15; Creatinine 1.05; Hemoglobin 10.0; Platelet Count 235; Potassium 4.4; Sodium 142; TSH 1.818    Lipid Panel No results found for: CHOL, TRIG, HDL, CHOLHDL, VLDL, LDLCALC, LDLDIRECT    Wt Readings  from Last 3 Encounters:  02/05/20 161 lb 9.6 oz (73.3 kg)  01/27/20 161 lb 1.6 oz (73.1 kg)  01/13/20 162 lb 9.6 oz (73.8 kg)      Other studies Reviewed: Additional studies/ records that were reviewed today include:  None Review of the above records demonstrates: See elswhere.     ASSESSMENT AND PLAN:  AS: I talked about this with him and his son again today.  He would like fairly conservative management and would like to wait and see how he feels after he is finished chemotherapy and what follow-up scans might show about his cancer.  He would not absolutely rule out further evaluation in the months to come.  He will let me know if he has any sudden worsening shortness of breath.   PERICARDIAL EFFUSION:   This was moderate.  I have a scheduled follow-up echo in July.  HTN: Blood pressures are running low.  No change in therapy.  Current medicines are reviewed at length with the patient today.  The patient does not have concerns regarding medicines.  The following changes have been made:  None  Labs/ tests ordered today include: None  No orders of the defined types were placed in this encounter.    Disposition:   FU with me in July  Signed, Rip Hawes, MD  02/05/2020 5:28 PM    [facility]

## 2020-02-05 ENCOUNTER — Encounter: Payer: Self-pay | Admitting: Cardiology

## 2020-02-05 ENCOUNTER — Other Ambulatory Visit: Payer: Self-pay

## 2020-02-05 ENCOUNTER — Ambulatory Visit: Payer: Medicare HMO | Admitting: Cardiology

## 2020-02-05 VITALS — BP 104/60 | HR 99 | Temp 98.1°F | Ht 68.0 in | Wt 161.6 lb

## 2020-02-05 DIAGNOSIS — Z7189 Other specified counseling: Secondary | ICD-10-CM

## 2020-02-05 DIAGNOSIS — I313 Pericardial effusion (noninflammatory): Secondary | ICD-10-CM | POA: Diagnosis not present

## 2020-02-05 DIAGNOSIS — I10 Essential (primary) hypertension: Secondary | ICD-10-CM | POA: Diagnosis not present

## 2020-02-05 DIAGNOSIS — I350 Nonrheumatic aortic (valve) stenosis: Secondary | ICD-10-CM | POA: Diagnosis not present

## 2020-02-05 DIAGNOSIS — I3139 Other pericardial effusion (noninflammatory): Secondary | ICD-10-CM

## 2020-02-05 NOTE — Patient Instructions (Signed)
Medication Instructions:  No changes *If you need a refill on your cardiac medications before your next appointment, please call your pharmacy*  Lab Work: None needed this visit  Testing/Procedures: None needed this visit  Follow-Up: At [facility], you and your health needs are our priority.  As part of our continuing mission to provide you with exceptional heart care, we have created designated Provider Care Teams.  These Care Teams include your primary Cardiologist (physician) and Advanced Practice Providers (APPs -  Physician Assistants and Nurse Practitioners) who all work together to provide you with the care you need, when you need it.  We recommend signing up for the patient portal called "MyChart".  Sign up information is provided on this After Visit Summary.  MyChart is used to connect with patients for Virtual Visits (Telemedicine).  Patients are able to view lab/test results, encounter notes, upcoming appointments, etc.  Non-urgent messages can be sent to your provider as well.   To learn more about what you can do with MyChart, go to [email or web address].    Your next appointment:   4 month(s) in July  The format for your next appointment:   In Person  Provider:   Minus Breeding, MD

## 2020-02-09 NOTE — Progress Notes (Signed)
Robert Dixon OFFICE PROGRESS NOTE  [name], MD [address] [place] [address]  DIAGNOSIS: Stage IIIB(T1b, N3, M0) non-small cell lung cancer favoring adenocarcinoma diagnosed in January 2020 and presented with left lower lobe pulmonary nodule in addition to bilateral hilar and subcarinal lymphadenopathy  PRIOR THERAPY: Concurrent chemoradiation with weekly carboplatin for an AUC of 2 and paclitaxel 45 mg/m2. Status post 5 cycles.  CURRENT THERAPY: Consolidation immunotherapy with Imfinzi 10 mg/kg IV every 2 weeks. First dose March 25, 2019.Status post23cycles.  INTERVAL HISTORY: Robert Dixon 79 y.o. male returns to the clinic for a follow up visit. The patient is feeling well today without any concerning complaints. He continues to tolerate his treatment with immunotherapy fairly well without any concerning adverse effects except for fatigue 1 to 2 days following treatment and dry skin. He denies any recent fever, chills, night sweats, or weight loss. He reports his baseline shortness of breath with exertionandchroniccoughwhich is multifactorial. He follows closely with pulmonology as well as cardiology for this concern. He denies any headache or visual changes. Heendorses dry skin, butdenies any rashes orotherskin changes. He denies any nausea, vomiting, diarrhea, or constipation but sometimes has an unsettling feeling in his stomach.  He is here today for evaluation before starting cycle #24.  MEDICAL HISTORY: Past Medical History:  Diagnosis Date  . Cancer ([place])    skin-   . Complication of anesthesia   . COPD (chronic obstructive pulmonary disease) ([place])   . Dyspnea   . GERD (gastroesophageal reflux disease)   . Hemoptysis 11/20/2018  . History of hiatal hernia   . Hypertension   . Hypoxia 11/20/2018  . 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 Migraine with visual aura   . Non-small cell lung cancer ([place]) dx'd 11/20/18  .  Pneumonia 11/20/2018  . PONV (postoperative nausea and vomiting)   . Pulmonary nodule 11/20/2018    ALLERGIES:  has No Known Allergies.  MEDICATIONS:  Current Outpatient Medications  Medication Sig Dispense Refill  . albuterol (PROVENTIL HFA;VENTOLIN HFA) 108 (90 Base) MCG/ACT inhaler Inhale 2 puffs into the lungs every 6 (six) hours as needed for wheezing or shortness of breath. 1 Inhaler 6  . albuterol (PROVENTIL) (2.5 MG/3ML) 0.083% nebulizer solution Take 3 mLs (2.5 mg total) by nebulization every 4 (four) hours. 360 mL 1  . BREZTRI AEROSPHERE 160-9-4.8 MCG/ACT AERO     . Cyanocobalamin (VITAMIN B 12 PO) Take 1 capsule by mouth daily. 2500 mcg    . desonide (DESOWEN) 0.05 % cream Apply 1 application topically 2 (two) times daily.    Marland Kitchen dimenhyDRINATE (DRAMAMINE) 50 MG tablet Take 50 mg by mouth every 8 (eight) hours as needed for dizziness.     Hunt Oris (IMFINZI IV) Inject into the vein every 14 (fourteen) days.    Marland Kitchen EFUDEX 5 % cream 1 application apply on the skin twice a day    . erythromycin with ethanol (EMGEL) 2 % gel Apply 1 application topically 2 (two) times daily.    . hydrocortisone 2.5 % cream     . ketoconazole (NIZORAL) 2 % cream 1 application apply on the skin as directed  1 application apply on the skin as directed One week on and one week off    . lidocaine-prilocaine (EMLA) cream Apply 1 application topically as needed. 30 g 0  . Multiple Vitamin (MULTIVITAMIN) tablet Take 1 tablet by mouth daily.    . pantoprazole (PROTONIX) 40 MG tablet Take 40  mg by mouth 2 (two) times daily.    . polycarbophil (FIBERCON) 625 MG tablet Take 625 mg by mouth 2 (two) times daily.     . prochlorperazine (COMPAZINE) 10 MG tablet TAKE ONE TABLET BY MOUTH EVERY 6 HOURS AS NEEDED FOR NAUSEA OR VOMITING 30 tablet 0  . tamsulosin (FLOMAX) 0.4 MG CAPS capsule     . traMADol (ULTRAM) 50 MG tablet Take 50 mg by mouth 2 (two) times daily as needed.     No current facility-administered  medications for this visit.   Facility-Administered Medications Ordered in Other Visits  Medication Dose Route Frequency Provider Last Rate Last Admin  . sodium chloride flush (NS) 0.9 % injection 10 mL  10 mL Intracatheter PRN [name], MD      . sodium chloride flush (NS) 0.9 % injection 10 mL  10 mL Intracatheter PRN [name], MD   10 mL at 05/20/19 1232    SURGICAL HISTORY:  Past Surgical History:  Procedure Laterality Date  . COLONOSCOPY W/ POLYPECTOMY    . IR IMAGING GUIDED PORT INSERTION  03/14/2019  . TONSILLECTOMY    . VASECTOMY    . VIDEO BRONCHOSCOPY WITH ENDOBRONCHIAL ULTRASOUND N/A 12/06/2018   Procedure: VIDEO BRONCHOSCOPY WITH ENDOBRONCHIAL ULTRASOUND;  Surgeon: Garner Nash, DO;  Location: [facility] OR;  Service: Thoracic;  Laterality: N/A;    REVIEW OF SYSTEMS:   Review of Systems  Constitutional: Positive for fatigue. Negative for appetite change, chills, fever and unexpected weight change.  HENT: Negative for mouth sores, nosebleeds, sore throat and trouble swallowing.  Eyes: Negative for eye problems and icterus.  Respiratory:Endorses chronic cough and dyspnea on exertion.Negative for hemoptysis and wheezing.  Cardiovascular: Negative for chest pain and leg swelling.  Gastrointestinal: Negative for abdominal pain, constipation, diarrhea, nausea and vomiting.  Genitourinary:Endorsesdifficulty urinatingsecondary to BPH.Negative for bladder incontinence,dysuria, frequency and hematuria.  Musculoskeletal: Negative for back pain, gait problem, neck pain and neck stiffness.  Skin:Endorses skin dryness.Negative for itching and rash.  Neurological: Negative for dizziness, extremity weakness, gait problem, headaches, light-headedness and seizures.  Hematological: Negative for adenopathy. Does not bruise/bleed easily.  Psychiatric/Behavioral: Negative for confusion, depression and sleep disturbance. The patient is not nervous/anxious.    PHYSICAL  EXAMINATION:  Blood pressure 118/60, pulse 91, temperature 97.8 F (36.6 C), temperature source Temporal, resp. rate 18, height 5\' 8"  (1.727 m), weight 164 lb 3.2 oz (74.5 kg), SpO2 98 %.  ECOG PERFORMANCE STATUS: 2 - Symptomatic, <50% confined to bed  Physical Exam  Constitutional: Oriented to person, place, and time and elderly male and in no distress. HENT:  Head: Normocephalic and atraumatic.  Mouth/Throat: Oropharynx is clear and moist. No oropharyngeal exudate.  Eyes: Conjunctivae are normal. Right eye exhibits no discharge. Left eye exhibits no discharge. No scleral icterus.  Neck: Normal range of motion. Neck supple.  Cardiovascular: Systolic murmur heard throughout. Normal rate, regular rhythm, and intact distal pulses.   Pulmonary/Chest: Effort normal and breath sounds normal. No respiratory distress. No wheezes. No rales.  Abdominal: Soft. Bowel sounds are normal. Exhibits no distension and no mass. There is no tenderness.  Musculoskeletal: Normal range of motion. Exhibits no edema.  Lymphadenopathy:    No cervical adenopathy.  Neurological: Alert and oriented to person, place, and time. Exhibits normal muscle tone. Gait normal. Coordination normal.  Skin: Skin is warm and dry. No rash noted. Not diaphoretic. No erythema. No pallor.  Psychiatric: Mood, memory and judgment normal.  Vitals reviewed.  LABORATORY DATA: Lab Results  Component Value Date   WBC 5.5 02/10/2020   HGB 9.6 (L) 02/10/2020   HCT 29.1 (L) 02/10/2020   MCV 122.3 (H) 02/10/2020   PLT 219 02/10/2020      Chemistry      Component Value Date/Time   NA 140 02/10/2020 0853   K 4.2 02/10/2020 0853   CL 111 02/10/2020 0853   CO2 23 02/10/2020 0853   BUN 13 02/10/2020 0853   CREATININE 0.93 02/10/2020 0853      Component Value Date/Time   CALCIUM 8.2 (L) 02/10/2020 0853   ALKPHOS 92 02/10/2020 0853   AST 13 (L) 02/10/2020 0853   ALT 10 02/10/2020 0853   BILITOT 0.7 02/10/2020 0853        RADIOGRAPHIC STUDIES:  No results found.   ASSESSMENT/PLAN:  This is a very pleasant 79 year old Caucasian male diagnosed with stage IIIb non-small cell lung cancer, adenocarcinoma of the left lower lobe. He was diagnosed in January 2020. He has no actionable mutations.  The patient underwent concurrent chemoradiation with carboplatin for an AUC of 2 and paclitaxel 45 mg/m. He is status post 5 cycles. He was unable to proceed with cycle #6 secondary to thrombocytopenia.   The patient is currently undergoing consolidation immunotherapy with Imfinzi 10 mg/kg IV every 2 weeks. Status post23cycles.He has been tolerating treatment well without any adverse effects.  Labs were reviewed.We recommendthat the patient continue with cycle #24today as scheduled.  We will see him back for a follow up visit in 2 weeks for evaluation before starting cycle #25.  The patient was advised to call immediately if he has any concerning symptoms in the interval. The patient voices understanding of current disease status and treatment options and is in agreement with the current care plan. All questions were answered. The patient knows to call the clinic with any problems, questions or concerns. We can certainly see the patient much sooner if necessary    No orders of the defined types were placed in this encounter.    Eileen Kangas L Amelio Brosky, PA-C 02/10/20

## 2020-02-10 ENCOUNTER — Inpatient Hospital Stay: Payer: Medicare HMO

## 2020-02-10 ENCOUNTER — Inpatient Hospital Stay: Payer: Medicare HMO | Admitting: Physician Assistant

## 2020-02-10 ENCOUNTER — Other Ambulatory Visit: Payer: Self-pay

## 2020-02-10 ENCOUNTER — Encounter: Payer: Self-pay | Admitting: Physician Assistant

## 2020-02-10 VITALS — BP 118/60 | HR 91 | Temp 97.8°F | Resp 18 | Ht 68.0 in | Wt 164.2 lb

## 2020-02-10 DIAGNOSIS — Z79899 Other long term (current) drug therapy: Secondary | ICD-10-CM | POA: Diagnosis not present

## 2020-02-10 DIAGNOSIS — I10 Essential (primary) hypertension: Secondary | ICD-10-CM | POA: Diagnosis not present

## 2020-02-10 DIAGNOSIS — R5383 Other fatigue: Secondary | ICD-10-CM | POA: Diagnosis not present

## 2020-02-10 DIAGNOSIS — C3412 Malignant neoplasm of upper lobe, left bronchus or lung: Secondary | ICD-10-CM

## 2020-02-10 DIAGNOSIS — R011 Cardiac murmur, unspecified: Secondary | ICD-10-CM | POA: Diagnosis not present

## 2020-02-10 DIAGNOSIS — C3492 Malignant neoplasm of unspecified part of left bronchus or lung: Secondary | ICD-10-CM

## 2020-02-10 DIAGNOSIS — Z5112 Encounter for antineoplastic immunotherapy: Secondary | ICD-10-CM | POA: Diagnosis not present

## 2020-02-10 DIAGNOSIS — R0609 Other forms of dyspnea: Secondary | ICD-10-CM | POA: Diagnosis not present

## 2020-02-10 DIAGNOSIS — J449 Chronic obstructive pulmonary disease, unspecified: Secondary | ICD-10-CM | POA: Diagnosis not present

## 2020-02-10 DIAGNOSIS — K219 Gastro-esophageal reflux disease without esophagitis: Secondary | ICD-10-CM | POA: Diagnosis not present

## 2020-02-10 DIAGNOSIS — Z95828 Presence of other vascular implants and grafts: Secondary | ICD-10-CM

## 2020-02-10 LAB — CBC WITH DIFFERENTIAL (CANCER CENTER ONLY)
Abs Immature Granulocytes: 0.02 10*3/uL (ref 0.00–0.07)
Basophils Absolute: 0.1 10*3/uL (ref 0.0–0.1)
Basophils Relative: 2 %
Eosinophils Absolute: 0.1 10*3/uL (ref 0.0–0.5)
Eosinophils Relative: 1 %
HCT: 29.1 % — ABNORMAL LOW (ref 39.0–52.0)
Hemoglobin: 9.6 g/dL — ABNORMAL LOW (ref 13.0–17.0)
Immature Granulocytes: 0 %
Lymphocytes Relative: 9 %
Lymphs Abs: 0.5 10*3/uL — ABNORMAL LOW (ref 0.7–4.0)
MCH: 40.3 pg — ABNORMAL HIGH (ref 26.0–34.0)
MCHC: 33 g/dL (ref 30.0–36.0)
MCV: 122.3 fL — ABNORMAL HIGH (ref 80.0–100.0)
Monocytes Absolute: 0.5 10*3/uL (ref 0.1–1.0)
Monocytes Relative: 9 %
Neutro Abs: 4.3 10*3/uL (ref 1.7–7.7)
Neutrophils Relative %: 79 %
Platelet Count: 219 10*3/uL (ref 150–400)
RBC: 2.38 MIL/uL — ABNORMAL LOW (ref 4.22–5.81)
RDW: 15.3 % (ref 11.5–15.5)
WBC Count: 5.5 10*3/uL (ref 4.0–10.5)
nRBC: 0 % (ref 0.0–0.2)

## 2020-02-10 LAB — CMP (CANCER CENTER ONLY)
ALT: 10 U/L (ref 0–44)
AST: 13 U/L — ABNORMAL LOW (ref 15–41)
Albumin: 3.6 g/dL (ref 3.5–5.0)
Alkaline Phosphatase: 92 U/L (ref 38–126)
Anion gap: 6 (ref 5–15)
BUN: 13 mg/dL (ref 8–23)
CO2: 23 mmol/L (ref 22–32)
Calcium: 8.2 mg/dL — ABNORMAL LOW (ref 8.9–10.3)
Chloride: 111 mmol/L (ref 98–111)
Creatinine: 0.93 mg/dL (ref 0.61–1.24)
GFR, Est AFR Am: >60 mL/min (ref >60)
GFR, Estimated: >60 mL/min (ref >60)
Glucose, Bld: 134 mg/dL — ABNORMAL HIGH (ref 70–99)
Potassium: 4.2 mmol/L (ref 3.5–5.1)
Sodium: 140 mmol/L (ref 135–145)
Total Bilirubin: 0.7 mg/dL (ref 0.3–1.2)
Total Protein: 5.9 g/dL — ABNORMAL LOW (ref 6.5–8.1)

## 2020-02-10 MED ORDER — SODIUM CHLORIDE 0.9% FLUSH
10.0000 mL | INTRAVENOUS | Status: DC | PRN
  Administered 2020-02-10: 10 mL
  Filled 2020-02-10: qty 10

## 2020-02-10 MED ORDER — SODIUM CHLORIDE 0.9 % IV SOLN
Freq: Once | INTRAVENOUS | Status: AC
  Filled 2020-02-10: qty 250

## 2020-02-10 MED ORDER — SODIUM CHLORIDE 0.9 % IV SOLN
720.0000 mg | Freq: Once | INTRAVENOUS | Status: AC
  Administered 2020-02-10: 720 mg via INTRAVENOUS
  Filled 2020-02-10: qty 14.4

## 2020-02-10 MED ORDER — HEPARIN SOD (PORK) LOCK FLUSH 100 UNIT/ML IV SOLN
500.0000 [IU] | Freq: Once | INTRAVENOUS | Status: AC | PRN
  Administered 2020-02-10: 500 [IU]
  Filled 2020-02-10: qty 5

## 2020-02-10 NOTE — Patient Instructions (Signed)
[facility] Discharge Instructions for Patients Receiving Chemotherapy  Today you received the following chemotherapy agents: Imfinzi.  To help prevent nausea and vomiting after your treatment, we encourage you to take your nausea medication as directed.   If you develop nausea and vomiting that is not controlled by your nausea medication, call the clinic.   BELOW ARE SYMPTOMS THAT SHOULD BE REPORTED IMMEDIATELY:  *FEVER GREATER THAN 100.5 F  *CHILLS WITH OR WITHOUT FEVER  NAUSEA AND VOMITING THAT IS NOT CONTROLLED WITH YOUR NAUSEA MEDICATION  *UNUSUAL SHORTNESS OF BREATH  *UNUSUAL BRUISING OR BLEEDING  TENDERNESS IN MOUTH AND THROAT WITH OR WITHOUT PRESENCE OF ULCERS  *URINARY PROBLEMS  *BOWEL PROBLEMS  UNUSUAL RASH Items with * indicate a potential emergency and should be followed up as soon as possible.  Feel free to call the clinic should you have any questions or concerns. The clinic phone number is [phone].  Please show the CHEMO ALERT CARD at check-in to the Emergency Department and triage nurse.   

## 2020-02-11 ENCOUNTER — Ambulatory Visit: Payer: Medicare HMO

## 2020-02-11 ENCOUNTER — Ambulatory Visit: Payer: Medicare HMO | Attending: Internal Medicine

## 2020-02-11 DIAGNOSIS — Z23 Encounter for immunization: Secondary | ICD-10-CM

## 2020-02-11 NOTE — Progress Notes (Signed)
   Covid-19 Vaccination Clinic  Name:  Robert Dixon    MRN: [id number] DOB: November 18, 1941  02/11/2020  [name] was observed post Covid-19 immunization for 15 minutes without incident. He was provided with Vaccine Information Sheet and instruction to access the V-Safe system.   [name] was instructed to call 911 with any severe reactions post vaccine: Marland Kitchen Difficulty breathing  . Swelling of face and throat  . A fast heartbeat  . A bad rash all over body  . Dizziness and weakness   Immunizations Administered    Name Date Dose VIS Date Route   Pfizer COVID-19 Vaccine 02/11/2020  9:27 AM 0.3 mL 11/07/2019 Intramuscular   Manufacturer: [place]   Lot: [id number]   [place]: [id number]

## 2020-02-18 ENCOUNTER — Telehealth: Payer: Self-pay | Admitting: Internal Medicine

## 2020-02-18 ENCOUNTER — Ambulatory Visit: Payer: Medicare HMO | Admitting: Pulmonary Disease

## 2020-02-18 ENCOUNTER — Encounter: Payer: Self-pay | Admitting: Pulmonary Disease

## 2020-02-18 ENCOUNTER — Other Ambulatory Visit: Payer: Self-pay

## 2020-02-18 VITALS — BP 108/60 | HR 102 | Ht 68.0 in | Wt 162.0 lb

## 2020-02-18 DIAGNOSIS — J90 Pleural effusion, not elsewhere classified: Secondary | ICD-10-CM

## 2020-02-18 DIAGNOSIS — J432 Centrilobular emphysema: Secondary | ICD-10-CM | POA: Diagnosis not present

## 2020-02-18 DIAGNOSIS — J9611 Chronic respiratory failure with hypoxia: Secondary | ICD-10-CM

## 2020-02-18 DIAGNOSIS — C3492 Malignant neoplasm of unspecified part of left bronchus or lung: Secondary | ICD-10-CM | POA: Diagnosis not present

## 2020-02-18 DIAGNOSIS — J701 Chronic and other pulmonary manifestations due to radiation: Secondary | ICD-10-CM | POA: Diagnosis not present

## 2020-02-18 DIAGNOSIS — Z5112 Encounter for antineoplastic immunotherapy: Secondary | ICD-10-CM

## 2020-02-18 DIAGNOSIS — Z923 Personal history of irradiation: Secondary | ICD-10-CM

## 2020-02-18 NOTE — Patient Instructions (Addendum)
Thank you for visiting [name] at [facility]. Today we recommend the following:  Back to trelegy inhaler Goal sats 88% on O2 at home [facility] to continue to check them at home.  Referral to palliative outpatient care  Return in about 4 months (around 06/19/2020) for w/ Robert Dixon .    Please do your part to reduce the spread of COVID-19.

## 2020-02-18 NOTE — Telephone Encounter (Signed)
Scheduled [facility] Palliative visit for 02-23-20 at 11:00.

## 2020-02-18 NOTE — Progress Notes (Signed)
Synopsis: Referred in January 2020 for abnormal CT, lung mass, mediastinal adenopathy by [name], MD  Subjective:   PATIENT ID: Robert Dixon: male DOB: Jul 31, 1941, MRN: [id number]  Chief Complaint  Patient presents with  . Follow-up    Pt states he has been doing okay since last visit. States that he may be a little worse as he is only able to walk about 20 feet instead of about 50 feet.     PMH of former smoker. He had a cough for several weeks, month treated with abx. He started coughing up blood.  Patient was taken to [facility] where he was treated for pneumonia.  He was given a steroid pack and antibiotics.  He was treated with bronchodilators while he was there.  CT scan imaging revealed a left infrahilar mass with endobronchial disease within the upper lobe as well as enlarged mediastinal adenopathy.  Patient was referred to our pulmonary office for evaluation of potential underlying lung cancer.  OV 06/18/2019: Since we were last seen each other in the office patient has underwent bronchoscopy back in January for diagnosis of his lung cancer.  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  He has subsequently underwent concurrent chemoradiation with weekly carboplatinum and paclitaxel and his now on consolidation immunotherapy Imfinzi every 2 weeks.  He has been seen in the office over the past month with a diagnosis of pneumonia.  He was treated for this as an outpatient and has overall recovered.  He does feel like his functional status has slowly declined.  He feels much weaker than he did before.  But has been stable over the past few weeks.  He does feel better after treatment for pneumonia.  He had follow-up CAT scan imaging which reveals radiation changes to the left lung.  At this time he is still using his oxygen continuously.  CT scan revealed radiation fibrosis.  OV  10/01/2019:Patient followed in clinic today for history of lung cancer, undergoing immunotherapy treatments following concurrent chemoradiation.  Currently on Imfinzi.  Patient last seen by oncology on 09/23/2019.  Patient doing well.  Here today for follow-up regarding dyspnea.  He does feel more short of breath than was before.  He did have urinary symptoms with Trelegy and this was stopped.  Urinary symptoms are stable at this time no hesitancy.  He recently had CT imaging of the chest in the middle of October which revealed a loculated left side pleural effusion.  He still has significant radiation-induced fibrosis of the left lung status post his chemo XRT.  Otherwise doing well.  He states he has follow-up planned with cardiology.  Has a echocardiogram with severe aortic stenosis with an aortic valve area of 0.78 and a mean gradient of 39.  OV 11/13/2019: 79 year old gentleman currently undergoing treatments for stage IIIb adenocarcinoma.  Immunotherapy at this time with Dr. Earlie Server.  Here today for follow-up and discussion of his current symptoms.  Still has significant symptoms to include dyspnea on exertion and shortness of breath.  Was seen by cardiology Dr. Percival Spanish.  Echocardiogram with severe aortic stenosis.  Has follow-up with him planned.  Overall symptoms stable on new inhaler regimen.  He still has shortness of breath but he has less urinary symptoms and hesitancy by switching to breztri.   OV 02/18/2020: Patient here today for follow-up. Has some ongoing symptomatology. I am concerned that he lives alone.  He does have significant dyspnea on exertion. At this point from a cardiac standpoint they are going to wait his severe AS and see what his subsequent imaging follow-up from oncology looks like. I think this is reasonable. He has multifactorial etiology for his dyspnea exertion. We discussed this today in the office. He does seem little bit more depressed today but feels like he has been  through a lot over the past several months.  Past Medical History:  Diagnosis Date  . Cancer ([place])    skin-   . Complication of anesthesia   . COPD (chronic obstructive pulmonary disease) ([place])   . Dyspnea   . GERD (gastroesophageal reflux disease)   . Hemoptysis 11/20/2018  . History of hiatal hernia   . Hypertension   . Hypoxia 11/20/2018  . 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 Migraine with visual aura   . Non-small cell lung cancer ([place]) dx'd 11/20/18  . Pneumonia 11/20/2018  . PONV (postoperative nausea and vomiting)   . Pulmonary nodule 11/20/2018     Family History  Problem Relation Age of Onset  . Cancer - Lung Mother   . Hypertension Father   . CVA Father   . Cancer - Cervical Sister      Past Surgical History:  Procedure Laterality Date  . COLONOSCOPY W/ POLYPECTOMY    . IR IMAGING GUIDED PORT INSERTION  03/14/2019  . TONSILLECTOMY    . VASECTOMY    . VIDEO BRONCHOSCOPY WITH ENDOBRONCHIAL ULTRASOUND N/A 12/06/2018   Procedure: VIDEO BRONCHOSCOPY WITH ENDOBRONCHIAL ULTRASOUND;  Surgeon: Garner Nash, DO;  Location: [facility] OR;  Service: Thoracic;  Laterality: N/A;    Social History   Socioeconomic History  . Marital status: Widowed    Spouse name: Not on file  . Number of children: Not on file  . Years of education: Not on file  . Highest education level: Not on file  Occupational History  . Not on file  Tobacco Use  . Smoking status: Former Smoker    Years: 30.00    Types: Cigarettes    Quit date: 2000    Years since quitting: 21.2  . Smokeless tobacco: Never Used  Substance and Sexual Activity  . Alcohol use: No  . Drug use: No  . Sexual activity: Not on file  Other Topics Concern  . Not on file  Social History Narrative   Lives alone.  Two sons.  Widower.     Social Determinants of Health   Financial Resource Strain:   . Difficulty of Paying Living Expenses:   Food Insecurity:   . Worried About Charity fundraiser in the Last Year:   .  Arboriculturist in the Last Year:   Transportation Needs:   . Film/video editor (Medical):   Marland Kitchen Lack of Transportation (Non-Medical):   Physical Activity:   . Days of Exercise per Week:   . Minutes of Exercise per Session:   Stress:   . Feeling of Stress :   Social Connections:   . Frequency of Communication with Friends and Family:   . Frequency of Social Gatherings with Friends and Family:   . Attends Religious Services:   . Active Member of Clubs or Organizations:   . Attends Archivist Meetings:   Marland Kitchen Marital Status:   Intimate Partner Violence:   . Fear of Current or Ex-Partner:   . Emotionally Abused:   Marland Kitchen Physically Abused:   . Sexually Abused:  No Known Allergies   Outpatient Medications Prior to Visit  Medication Sig Dispense Refill  . albuterol (PROVENTIL HFA;VENTOLIN HFA) 108 (90 Base) MCG/ACT inhaler Inhale 2 puffs into the lungs every 6 (six) hours as needed for wheezing or shortness of breath. 1 Inhaler 6  . albuterol (PROVENTIL) (2.5 MG/3ML) 0.083% nebulizer solution Take 3 mLs (2.5 mg total) by nebulization every 4 (four) hours. 360 mL 1  . BREZTRI AEROSPHERE 160-9-4.8 MCG/ACT AERO     . Cyanocobalamin (VITAMIN B 12 PO) Take 1 capsule by mouth daily. 2500 mcg    . dimenhyDRINATE (DRAMAMINE) 50 MG tablet Take 50 mg by mouth every 8 (eight) hours as needed for dizziness.     Hunt Oris (IMFINZI IV) Inject into the vein every 14 (fourteen) days.    . Multiple Vitamin (MULTIVITAMIN) tablet Take 1 tablet by mouth daily.    . pantoprazole (PROTONIX) 40 MG tablet Take 40 mg by mouth 2 (two) times daily.    . polycarbophil (FIBERCON) 625 MG tablet Take 625 mg by mouth 2 (two) times daily.     . prochlorperazine (COMPAZINE) 10 MG tablet TAKE ONE TABLET BY MOUTH EVERY 6 HOURS AS NEEDED FOR NAUSEA OR VOMITING 30 tablet 0  . tamsulosin (FLOMAX) 0.4 MG CAPS capsule     . traMADol (ULTRAM) 50 MG tablet Take 50 mg by mouth 2 (two) times daily as needed.    .  desonide (DESOWEN) 0.05 % cream Apply 1 application topically 2 (two) times daily.    Marland Kitchen EFUDEX 5 % cream 1 application apply on the skin twice a day    . erythromycin with ethanol (EMGEL) 2 % gel Apply 1 application topically 2 (two) times daily.    . hydrocortisone 2.5 % cream     . ketoconazole (NIZORAL) 2 % cream 1 application apply on the skin as directed  1 application apply on the skin as directed One week on and one week off    . lidocaine-prilocaine (EMLA) cream Apply 1 application topically as needed. 30 g 0   Facility-Administered Medications Prior to Visit  Medication Dose Route Frequency Provider Last Rate Last Admin  . sodium chloride flush (NS) 0.9 % injection 10 mL  10 mL Intracatheter PRN [name], MD      . sodium chloride flush (NS) 0.9 % injection 10 mL  10 mL Intracatheter PRN [name], MD   10 mL at 05/20/19 1232    Review of Systems  Constitutional: Positive for malaise/fatigue. Negative for chills, fever and weight loss.  HENT: Negative for hearing loss, sore throat and tinnitus.   Eyes: Negative for blurred vision and double vision.  Respiratory: Positive for cough, sputum production and shortness of breath. Negative for hemoptysis, wheezing and stridor.   Cardiovascular: Negative for chest pain, palpitations, orthopnea, leg swelling and PND.  Gastrointestinal: Negative for abdominal pain, constipation, diarrhea, heartburn, nausea and vomiting.  Genitourinary: Negative for dysuria, hematuria and urgency.  Musculoskeletal: Negative for joint pain and myalgias.  Skin: Negative for itching and rash.  Neurological: Negative for dizziness, tingling, weakness and headaches.  Endo/Heme/Allergies: Negative for environmental allergies. Does not bruise/bleed easily.  Psychiatric/Behavioral: Negative for depression. The patient is not nervous/anxious and does not have insomnia.   All other systems reviewed and are negative.    Objective:  Physical  Exam Vitals reviewed.  Constitutional:      General: He is not in acute distress.    Appearance: He is well-developed.  HENT:  Head: Normocephalic and atraumatic.     Mouth/Throat:     Pharynx: No oropharyngeal exudate.  Eyes:     Conjunctiva/sclera: Conjunctivae normal.     Pupils: Pupils are equal, round, and reactive to light.  Neck:     Vascular: No JVD.     Trachea: No tracheal deviation.     Comments: Loss of supraclavicular fat Cardiovascular:     Rate and Rhythm: Normal rate and regular rhythm.     Heart sounds: S1 normal and S2 normal.     Comments: Distant heart tones Pulmonary:     Effort: No tachypnea or accessory muscle usage.     Breath sounds: No stridor. Decreased breath sounds (throughout all lung fields) present. No wheezing, rhonchi or rales.  Abdominal:     General: Bowel sounds are normal. There is no distension.     Palpations: Abdomen is soft.     Tenderness: There is no abdominal tenderness.  Musculoskeletal:        General: Deformity (muscle wasting ) present.  Skin:    General: Skin is warm and dry.     Capillary Refill: Capillary refill takes less than 2 seconds.     Findings: No rash.  Neurological:     Mental Status: He is alert and oriented to person, place, and time.  Psychiatric:        Behavior: Behavior normal.      Vitals:   02/18/20 1531  BP: 108/60  Pulse: (!) 102  SpO2: 94%  Weight: 162 lb (73.5 kg)  Height: 5\' 8"  (1.727 m)   94% on RA BMI Readings from Last 3 Encounters:  02/18/20 24.63 kg/m  02/10/20 24.97 kg/m  02/05/20 24.57 kg/m   Wt Readings from Last 3 Encounters:  02/18/20 162 lb (73.5 kg)  02/10/20 164 lb 3.2 oz (74.5 kg)  02/05/20 161 lb 9.6 oz (73.3 kg)    CBC    Component Value Date/Time   WBC 5.5 02/10/2020 0853   WBC 6.5 03/14/2019 0929   RBC 2.38 (L) 02/10/2020 0853   HGB 9.6 (L) 02/10/2020 0853   HCT 29.1 (L) 02/10/2020 0853   PLT 219 02/10/2020 0853   MCV 122.3 (H) 02/10/2020 0853   MCH  40.3 (H) 02/10/2020 0853   MCHC 33.0 02/10/2020 0853   RDW 15.3 02/10/2020 0853   LYMPHSABS 0.5 (L) 02/10/2020 0853   MONOABS 0.5 02/10/2020 0853   EOSABS 0.1 02/10/2020 0853   BASOSABS 0.1 02/10/2020 0853    Chest Imaging: CT chest 11/20/2018: Left hilar mass with associated adenopathy endobronchial narrowing, subcarinal and bilateral hilar adenopathy. Ill-defined small density within the liver possible focus of metastatic disease.  CT chest July 2020: Fibrotic post radiation changes into the left chest, pleural thickening, intralobular septal thickening The patient's images have been independently reviewed by me.    CT chest October 2020: Fibrotic changes of the left lung pleural thickening interlobular septal thickening and banding consistent with radiation treatments.  Also has a loculated left-sided effusion.  Pleural effusion is larger in comparison to previous CT imaging. The patient's images have been independently reviewed by me.    Pulmonary Functions Testing Results: No flowsheet data found.  FeNO: None   Pathology: None   Echocardiogram:  October 2020: Severe calcific aortic stenosis.  Please see echo report for details  Heart Catheterization: None     Assessment & Plan:     ICD-10-CM   1. Non-small cell carcinoma of left lung, stage 3 (HCC)  C34.92  2. Centrilobular emphysema ([place])  J43.2   3. Chronic respiratory failure with hypoxia (HCC)  J96.11   4. Pleural effusion  J90   5. 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 Maintenance antineoplastic immunotherapy  Z51.12   7. S/P radiation therapy > 12 wks ago  Z92.3     Discussion:  This is a 79 year old gentleman undergoing immunotherapy for stage III non-small cell lung cancer, significant centrilobular emphysema, chronic hypoxic respiratory failure, small pleural effusion radiation-induced fibrosis. Has multifactorial reasons for his chronic hypoxemic respiratory failure. Additionally he has severe  aortic stenosis.  Plan: Continue follow-up with medical oncology Continued immunotherapy. Switch back to Trelegy disease and required epidural nebulizer as needed for shortness of breath and wheezing. I have placed a referral to outpatient palliative care to see patient. At some point I am not sure if the patient is unable to care for himself at home.  Today in the office we also discussed consideration for durable DNR and [facility] MOST forms.  I think this would be reasonable. He is going to talk to his family about this as well.  16 minutes of today's office visit spent with advanced care planning.  Greater than 50% of this patient's 35-minute office visit was been face-to-face discussing the above recommendations and treatment.   Current Outpatient Medications:  .  albuterol (PROVENTIL HFA;VENTOLIN HFA) 108 (90 Base) MCG/ACT inhaler, Inhale 2 puffs into the lungs every 6 (six) hours as needed for wheezing or shortness of breath., Disp: 1 Inhaler, Rfl: 6 .  albuterol (PROVENTIL) (2.5 MG/3ML) 0.083% nebulizer solution, Take 3 mLs (2.5 mg total) by nebulization every 4 (four) hours., Disp: 360 mL, Rfl: 1 .  BREZTRI AEROSPHERE 160-9-4.8 MCG/ACT AERO, , Disp: , Rfl:  .  Cyanocobalamin (VITAMIN B 12 PO), Take 1 capsule by mouth daily. 2500 mcg, Disp: , Rfl:  .  dimenhyDRINATE (DRAMAMINE) 50 MG tablet, Take 50 mg by mouth every 8 (eight) hours as needed for dizziness. , Disp: , Rfl:  .  Durvalumab (IMFINZI IV), Inject into the vein every 14 (fourteen) days., Disp: , Rfl:  .  Multiple Vitamin (MULTIVITAMIN) tablet, Take 1 tablet by mouth daily., Disp: , Rfl:  .  pantoprazole (PROTONIX) 40 MG tablet, Take 40 mg by mouth 2 (two) times daily., Disp: , Rfl:  .  polycarbophil (FIBERCON) 625 MG tablet, Take 625 mg by mouth 2 (two) times daily. , Disp: , Rfl:  .  prochlorperazine (COMPAZINE) 10 MG tablet, TAKE ONE TABLET BY MOUTH EVERY 6 HOURS AS NEEDED FOR NAUSEA OR VOMITING, Disp: 30 tablet,  Rfl: 0 .  tamsulosin (FLOMAX) 0.4 MG CAPS capsule, , Disp: , Rfl:  .  traMADol (ULTRAM) 50 MG tablet, Take 50 mg by mouth 2 (two) times daily as needed., Disp: , Rfl:  No current facility-administered medications for this visit.  Facility-Administered Medications Ordered in Other Visits:  .  sodium chloride flush (NS) 0.9 % injection 10 mL, 10 mL, Intracatheter, PRN, [name], MD .  sodium chloride flush (NS) 0.9 % injection 10 mL, 10 mL, Intracatheter, PRN, [name], MD, 10 mL at 05/20/19 1232   Garner Nash, DO [facility] 02/18/2020 3:54 PM

## 2020-02-20 DIAGNOSIS — C3412 Malignant neoplasm of upper lobe, left bronchus or lung: Secondary | ICD-10-CM | POA: Diagnosis not present

## 2020-02-20 DIAGNOSIS — J90 Pleural effusion, not elsewhere classified: Secondary | ICD-10-CM | POA: Diagnosis not present

## 2020-02-20 DIAGNOSIS — J9611 Chronic respiratory failure with hypoxia: Secondary | ICD-10-CM | POA: Diagnosis not present

## 2020-02-20 DIAGNOSIS — J9600 Acute respiratory failure, unspecified whether with hypoxia or hypercapnia: Secondary | ICD-10-CM | POA: Diagnosis not present

## 2020-02-20 DIAGNOSIS — J432 Centrilobular emphysema: Secondary | ICD-10-CM | POA: Diagnosis not present

## 2020-02-20 DIAGNOSIS — C3492 Malignant neoplasm of unspecified part of left bronchus or lung: Secondary | ICD-10-CM | POA: Diagnosis not present

## 2020-02-20 DIAGNOSIS — J439 Emphysema, unspecified: Secondary | ICD-10-CM | POA: Diagnosis not present

## 2020-02-22 NOTE — Progress Notes (Addendum)
March 29th, 2021 [facility] Palliative Care Consult Note Telephone: [phone]  Fax: [phone]  PATIENT NAME: Robert Dixon DOB: 1941/01/25 MRN: [id number] [address] [place] [address] [phone]  PRIMARY CARE PROVIDER:   [name], MD [place] [address] [place]  [address]  RobertMohamad Robert (Oncology) Dr. Minus Dixon (Cardiology)  REFERRING PROVIDER:  Percell Dixon [facility])  RESPONSIBLE PARTY:   Robert Dixon, Robert Dixon (D-I-L) [phone] ([place]), wife of Robert Dixon [phone]. Robert Dixon [phone], (PM) [phone].  ASSESSMENT / RECOMMENDATIONS:  1. Advance Care Planning: A. Directives: DNR form completed; form left in home and uploaded into [facility] EMR. I reviewed sections of the MOST form. I left a copy in the home, along with patient education materials. Patient and his two sons wish to review with D-I-L Robert Dixon.  B. Goals of Care: Hoping to increase in strength/endurance:  Asked about pulmonary rehab. I suggested he ask his pulmonologist for referral, if felt a candidate. Program is at [facility]; I'm not sure if it's up and running d/t COVID restrictions.   Asking about Home PT. Patient plans to ask for order from PCP Robert Dixon. I left patient with a copy of some low impact exercises; reviewed these with him. Patient has an appoint with Dr. Mckinley Dixon tomorrow.   2. Symptom Management: Dyspnea with minimal exertion 20-30 feet. Severe dyspnea if goes to mailbox. He no longer attempts this. He could do this 2 months ago. Continuous oxygen at 2LPM; turns up to 3 when exerting himself (dressing). It takes him about 2-3 min to recover to his baseline. He is independent in ADLs but needs to take his time. Very short winded when showering but didn't know he could shower with his oxygen on. He's going to try this next time, and at a higher flow rate. He has a shower chair. His resting sats are 92-93 %.  We  discussed techniques to decrease subjective symptoms of dyspena, such a wiping his face with a cool wet face cloth and blowing air on his face with a fan.   3. Cognitive / Functional status: A & O x3. Driving (has a portable oxygen concentrator). He ambulates about the home without assistive devices. He can get lightheaded and dizzy when first getting up. He has Meclizine available that he uses about 1-2x/month. Patient is independent in his ADLs, though he has to take his time to avoid excessive dyspnea. He doesn't use assistive devices. Often utilizes walls/furniture to steady himself. His family have ready made meals provided and available that he can heat up. His current weight (02/18/20) is 162lbs. At a height of 5'8" his BMI is 24.63kg/m2.  -discussed obtaining a rolling walker with a seat. Could get a urinal to use at night so won't have to travel to bathroom.   He has daytime urinary frequency (almost hourly). Night time less often, about twice, probably bc he takes a Flomax before bedtime. His daytime BP is a little soft (100systolic) so with the dizziness, prob shouldn't take a morning dose of Flomax.  -check with Robert Dixon if okay to start Proscar.  4. Family Supports: Widowed; spouse passed 4 yrs ago. He lives alone in the home in which he raised his family. He is very close to his 2 sons Robert Dixon and Robert Dixon. 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  5. Follow up Palliative Care Visit: Robert Dixon 04/01/20 @ 10am  I spent 60 minutes providing this consultation from 11am to noon. More than 50% of the time in this consultation was spent coordinating communication.   HISTORY OF PRESENT ILLNESS:  Robert Dixon is a 79 y.o. male with h/o non-small cell carcinoma of L Lung stage 3. (dx Jan 2020. Chemoradiation, Immunotherapy), L lung radiation fibrosis, loculated L pleural effusion (CT Nov 2020) continuous oxygen therapy, severe aortic stenosis with AVA  0.78 and gradient 39. Moderate aortic regurgitation. Moderate pericardial effusion. Preserved ejection fraction (echo Nov 2020; f/u echo scheduled for July 2021). COPD (centrilobular emphysema), GERD, HTN, Legionnaire's disease, migraines.  Palliative Care was asked to help address goals of care.   Dixon STATUS: DNR  PPS: 50%  HOSPICE ELIGIBILITY/DIAGNOSIS: TBD  PAST MEDICAL HISTORY:  Past Medical History:  Diagnosis Date  . Cancer ([place])    skin-   . Complication of anesthesia   . COPD (chronic obstructive pulmonary disease) ([place])   . Dyspnea   . GERD (gastroesophageal reflux disease)   . Hemoptysis 11/20/2018  . History of hiatal hernia   . Hypertension   . Hypoxia 11/20/2018  . 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 Migraine with visual aura   . Non-small cell lung cancer ([place]) dx'd 11/20/18  . Pneumonia 11/20/2018  . PONV (postoperative nausea and vomiting)   . Pulmonary nodule 11/20/2018    SOCIAL HX:  Social History   Tobacco Use  . Smoking status: Former Smoker    Years: 30.00    Types: Cigarettes    Quit date: 2000    Years since quitting: 21.2  . Smokeless tobacco: Never Used  Substance Use Topics  . Alcohol use: No    ALLERGIES: No Known Allergies   PERTINENT MEDICATIONS:  Outpatient Encounter Medications as of 02/23/2020  Medication Sig  . albuterol (PROVENTIL HFA;VENTOLIN HFA) 108 (90 Base) MCG/ACT inhaler Inhale 2 puffs into the lungs every 6 (six) hours as needed for wheezing or shortness of breath.  Marland Kitchen albuterol (PROVENTIL) (2.5 MG/3ML) 0.083% nebulizer solution Take 3 mLs (2.5 mg total) by nebulization every 4 (four) hours.  Marland Kitchen BREZTRI AEROSPHERE 160-9-4.8 MCG/ACT AERO   . Cyanocobalamin (VITAMIN B 12 PO) Take 1 capsule by mouth daily. 2500 mcg  . dimenhyDRINATE (DRAMAMINE) 50 MG tablet Take 50 mg by mouth every 8 (eight) hours as needed for dizziness.   Hunt Oris (IMFINZI IV) Inject into the vein every 14 (fourteen) days.  . Multiple Vitamin  (MULTIVITAMIN) tablet Take 1 tablet by mouth daily.  . pantoprazole (PROTONIX) 40 MG tablet Take 40 mg by mouth 2 (two) times daily.  . polycarbophil (FIBERCON) 625 MG tablet Take 625 mg by mouth 2 (two) times daily.   . prochlorperazine (COMPAZINE) 10 MG tablet TAKE ONE TABLET BY MOUTH EVERY 6 HOURS AS NEEDED FOR NAUSEA OR VOMITING  . tamsulosin (FLOMAX) 0.4 MG CAPS capsule   . traMADol (ULTRAM) 50 MG tablet Take 50 mg by mouth 2 (two) times daily as needed.   Facility-Administered Encounter Medications as of 02/23/2020  Medication  . sodium chloride flush (NS) 0.9 % injection 10 mL  . sodium chloride flush (NS) 0.9 % injection 10 mL    PHYSICAL EXAM:  Well nourished, pleasantly conversant, [place] in place. Independent in ambulation/transfers. Two sons Robert Dixon and Reeder in attendance.  Abbreviated PE to limit COVID exposure.  Extremities: no edema, no joint deformities Skin: no rashes Neurological: Weakness but otherwise non-focal  Julianne Handler, NP

## 2020-02-23 ENCOUNTER — Other Ambulatory Visit: Payer: Medicare HMO | Admitting: Internal Medicine

## 2020-02-23 ENCOUNTER — Other Ambulatory Visit: Payer: Self-pay

## 2020-02-23 ENCOUNTER — Encounter: Payer: Self-pay | Admitting: Internal Medicine

## 2020-02-23 DIAGNOSIS — Z515 Encounter for palliative care: Secondary | ICD-10-CM

## 2020-02-23 DIAGNOSIS — Z7189 Other specified counseling: Secondary | ICD-10-CM

## 2020-02-24 ENCOUNTER — Other Ambulatory Visit: Payer: Self-pay

## 2020-02-24 ENCOUNTER — Inpatient Hospital Stay: Payer: Medicare HMO

## 2020-02-24 ENCOUNTER — Encounter: Payer: Self-pay | Admitting: Internal Medicine

## 2020-02-24 ENCOUNTER — Inpatient Hospital Stay (HOSPITAL_BASED_OUTPATIENT_CLINIC_OR_DEPARTMENT_OTHER): Payer: Medicare HMO | Admitting: Internal Medicine

## 2020-02-24 VITALS — BP 116/61 | HR 94 | Temp 98.2°F | Resp 19 | Ht 68.0 in | Wt 162.5 lb

## 2020-02-24 DIAGNOSIS — Z5112 Encounter for antineoplastic immunotherapy: Secondary | ICD-10-CM

## 2020-02-24 DIAGNOSIS — C3412 Malignant neoplasm of upper lobe, left bronchus or lung: Secondary | ICD-10-CM

## 2020-02-24 DIAGNOSIS — R011 Cardiac murmur, unspecified: Secondary | ICD-10-CM | POA: Diagnosis not present

## 2020-02-24 DIAGNOSIS — C3492 Malignant neoplasm of unspecified part of left bronchus or lung: Secondary | ICD-10-CM

## 2020-02-24 DIAGNOSIS — K219 Gastro-esophageal reflux disease without esophagitis: Secondary | ICD-10-CM | POA: Diagnosis not present

## 2020-02-24 DIAGNOSIS — R0609 Other forms of dyspnea: Secondary | ICD-10-CM | POA: Diagnosis not present

## 2020-02-24 DIAGNOSIS — I10 Essential (primary) hypertension: Secondary | ICD-10-CM | POA: Diagnosis not present

## 2020-02-24 DIAGNOSIS — Z79899 Other long term (current) drug therapy: Secondary | ICD-10-CM | POA: Diagnosis not present

## 2020-02-24 DIAGNOSIS — J449 Chronic obstructive pulmonary disease, unspecified: Secondary | ICD-10-CM | POA: Diagnosis not present

## 2020-02-24 DIAGNOSIS — R5383 Other fatigue: Secondary | ICD-10-CM | POA: Diagnosis not present

## 2020-02-24 DIAGNOSIS — Z95828 Presence of other vascular implants and grafts: Secondary | ICD-10-CM

## 2020-02-24 LAB — CMP (CANCER CENTER ONLY)
ALT: 10 U/L (ref 0–44)
AST: 13 U/L — ABNORMAL LOW (ref 15–41)
Albumin: 3.6 g/dL (ref 3.5–5.0)
Alkaline Phosphatase: 95 U/L (ref 38–126)
Anion gap: 7 (ref 5–15)
BUN: 16 mg/dL (ref 8–23)
CO2: 22 mmol/L (ref 22–32)
Calcium: 8.4 mg/dL — ABNORMAL LOW (ref 8.9–10.3)
Chloride: 110 mmol/L (ref 98–111)
Creatinine: 1.08 mg/dL (ref 0.61–1.24)
GFR, Est AFR Am: >60 mL/min (ref >60)
GFR, Estimated: >60 mL/min (ref >60)
Glucose, Bld: 76 mg/dL (ref 70–99)
Potassium: 4.5 mmol/L (ref 3.5–5.1)
Sodium: 139 mmol/L (ref 135–145)
Total Bilirubin: 0.7 mg/dL (ref 0.3–1.2)
Total Protein: 6.3 g/dL — ABNORMAL LOW (ref 6.5–8.1)

## 2020-02-24 LAB — CBC WITH DIFFERENTIAL (CANCER CENTER ONLY)
Abs Immature Granulocytes: 0.03 10*3/uL (ref 0.00–0.07)
Basophils Absolute: 0.1 10*3/uL (ref 0.0–0.1)
Basophils Relative: 2 %
Eosinophils Absolute: 0.1 10*3/uL (ref 0.0–0.5)
Eosinophils Relative: 2 %
HCT: 30.1 % — ABNORMAL LOW (ref 39.0–52.0)
Hemoglobin: 9.9 g/dL — ABNORMAL LOW (ref 13.0–17.0)
Immature Granulocytes: 1 %
Lymphocytes Relative: 10 %
Lymphs Abs: 0.6 10*3/uL — ABNORMAL LOW (ref 0.7–4.0)
MCH: 39.6 pg — ABNORMAL HIGH (ref 26.0–34.0)
MCHC: 32.9 g/dL (ref 30.0–36.0)
MCV: 120.4 fL — ABNORMAL HIGH (ref 80.0–100.0)
Monocytes Absolute: 0.7 10*3/uL (ref 0.1–1.0)
Monocytes Relative: 12 %
Neutro Abs: 4.3 10*3/uL (ref 1.7–7.7)
Neutrophils Relative %: 73 %
Platelet Count: 238 10*3/uL (ref 150–400)
RBC: 2.5 MIL/uL — ABNORMAL LOW (ref 4.22–5.81)
RDW: 14.9 % (ref 11.5–15.5)
WBC Count: 5.8 10*3/uL (ref 4.0–10.5)
nRBC: 0 % (ref 0.0–0.2)

## 2020-02-24 LAB — TSH: TSH: 1.781 u[IU]/mL (ref 0.320–4.118)

## 2020-02-24 MED ORDER — SODIUM CHLORIDE 0.9% FLUSH
10.0000 mL | INTRAVENOUS | Status: DC | PRN
  Administered 2020-02-24: 10 mL
  Filled 2020-02-24: qty 10

## 2020-02-24 MED ORDER — HEPARIN SOD (PORK) LOCK FLUSH 100 UNIT/ML IV SOLN
500.0000 [IU] | Freq: Once | INTRAVENOUS | Status: AC | PRN
  Administered 2020-02-24: 500 [IU]
  Filled 2020-02-24: qty 5

## 2020-02-24 MED ORDER — SODIUM CHLORIDE 0.9 % IV SOLN
720.0000 mg | Freq: Once | INTRAVENOUS | Status: AC
  Administered 2020-02-24: 720 mg via INTRAVENOUS
  Filled 2020-02-24: qty 10

## 2020-02-24 MED ORDER — SODIUM CHLORIDE 0.9 % IV SOLN
Freq: Once | INTRAVENOUS | Status: AC
  Filled 2020-02-24: qty 250

## 2020-02-24 NOTE — Progress Notes (Signed)
Robert Dixon [phone]   [phone]  OFFICE PROGRESS NOTE  [name], MD [address] [place] [address]  DIAGNOSIS: Stage IIIB (T1b, N3, M0) non-small cell lung cancer favoring adenocarcinoma diagnosed in January 2020 and presented with left lower lobe pulmonary nodule in addition to bilateral hilar and subcarinal lymphadenopathy.  Molecular studies by guardant 360 showed no actionable mutations.  PRIOR THERAPY: Concurrent chemoradiation with weekly carboplatin for AUC of 2 and paclitaxel 45 mg/M2. First dose February 4th, 2020. Status post 5 cycles.   CURRENT THERAPY:  Consolidation immunotherapy with Imfinzi 10 mg/KG every 2 weeks, status post 24 cycles.  INTERVAL HISTORY: Robert Dixon 79 y.o. male returns to the clinic today for follow-up visit.  The patient is feeling fine today with no concerning complaints except for the baseline shortness of breath increased with exertion and he is currently on home oxygen secondary to COPD.  He denied having any current chest pain, cough or hemoptysis.  He denied having any fever or chills.  He has no nausea, vomiting, diarrhea or constipation.  He denied having any headache or visual changes.  The patient is here today for evaluation before starting cycle #25 of his consolidation immunotherapy.  MEDICAL HISTORY: Past Medical History:  Diagnosis Date  . Cancer ([place])    skin-   . Complication of anesthesia   . COPD (chronic obstructive pulmonary disease) ([place])   . Dyspnea   . GERD (gastroesophageal reflux disease)   . Hemoptysis 11/20/2018  . History of hiatal hernia   . Hypertension   . Hypoxia 11/20/2018  . 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 Migraine with visual aura   . Non-small cell lung cancer ([place]) dx'd 11/20/18  . Pneumonia 11/20/2018  . PONV (postoperative nausea and vomiting)   . Pulmonary nodule 11/20/2018    ALLERGIES:  has No Known  Allergies.  MEDICATIONS:  Current Outpatient Medications  Medication Sig Dispense Refill  . albuterol (PROVENTIL HFA;VENTOLIN HFA) 108 (90 Base) MCG/ACT inhaler Inhale 2 puffs into the lungs every 6 (six) hours as needed for wheezing or shortness of breath. 1 Inhaler 6  . albuterol (PROVENTIL) (2.5 MG/3ML) 0.083% nebulizer solution Take 3 mLs (2.5 mg total) by nebulization every 4 (four) hours. 360 mL 1  . BREZTRI AEROSPHERE 160-9-4.8 MCG/ACT AERO     . Cyanocobalamin (VITAMIN B 12 PO) Take 1 capsule by mouth daily. 2500 mcg    . dimenhyDRINATE (DRAMAMINE) 50 MG tablet Take 50 mg by mouth every 8 (eight) hours as needed for dizziness.     Hunt Oris (IMFINZI IV) Inject into the vein every 14 (fourteen) days.    . Multiple Vitamin (MULTIVITAMIN) tablet Take 1 tablet by mouth daily.    . pantoprazole (PROTONIX) 40 MG tablet Take 40 mg by mouth 2 (two) times daily.    . polycarbophil (FIBERCON) 625 MG tablet Take 625 mg by mouth 2 (two) times daily.     . prochlorperazine (COMPAZINE) 10 MG tablet TAKE ONE TABLET BY MOUTH EVERY 6 HOURS AS NEEDED FOR NAUSEA OR VOMITING 30 tablet 0  . tamsulosin (FLOMAX) 0.4 MG CAPS capsule     . traMADol (ULTRAM) 50 MG tablet Take 50 mg by mouth 2 (two) times daily as needed.     No current facility-administered medications for this visit.   Facility-Administered Medications Ordered in Other Visits  Medication Dose Route Frequency Provider Last Rate Last Admin  . sodium chloride flush (  NS) 0.9 % injection 10 mL  10 mL Intracatheter PRN [name], MD      . sodium chloride flush (NS) 0.9 % injection 10 mL  10 mL Intracatheter PRN [name], MD   10 mL at 05/20/19 1232    SURGICAL HISTORY:  Past Surgical History:  Procedure Laterality Date  . COLONOSCOPY W/ POLYPECTOMY    . IR IMAGING GUIDED PORT INSERTION  03/14/2019  . TONSILLECTOMY    . VASECTOMY    . VIDEO BRONCHOSCOPY WITH ENDOBRONCHIAL ULTRASOUND N/A 12/06/2018   Procedure: VIDEO  BRONCHOSCOPY WITH ENDOBRONCHIAL ULTRASOUND;  Surgeon: Garner Nash, DO;  Location: [facility] OR;  Service: Thoracic;  Laterality: N/A;    REVIEW OF SYSTEMS:  A comprehensive review of systems was negative except for: Respiratory: positive for dyspnea on exertion   PHYSICAL EXAMINATION: General appearance: alert, cooperative and no distress Head: Normocephalic, without obvious abnormality, atraumatic Neck: no adenopathy, no JVD, supple, symmetrical, trachea midline and thyroid not enlarged, symmetric, no tenderness/mass/nodules Lymph nodes: Cervical, supraclavicular, and axillary nodes normal. Resp: clear to auscultation bilaterally Back: symmetric, no curvature. ROM normal. No CVA tenderness. Cardio: systolic murmur: systolic ejection 3/6, harsh at 2nd right intercostal space GI: soft, non-tender; bowel sounds normal; no masses,  no organomegaly Extremities: extremities normal, atraumatic, no cyanosis or edema  ECOG PERFORMANCE STATUS: 1 - Symptomatic but completely ambulatory  Blood pressure 116/61, pulse 94, temperature 98.2 F (36.8 C), temperature source Oral, resp. rate 19, height 5\' 8"  (1.727 m), weight 162 lb 8 oz (73.7 kg), SpO2 98 %.  LABORATORY DATA: Lab Results  Component Value Date   WBC 5.8 02/24/2020   HGB 9.9 (L) 02/24/2020   HCT 30.1 (L) 02/24/2020   MCV 120.4 (H) 02/24/2020   PLT 238 02/24/2020      Chemistry      Component Value Date/Time   NA 140 02/10/2020 0853   K 4.2 02/10/2020 0853   CL 111 02/10/2020 0853   CO2 23 02/10/2020 0853   BUN 13 02/10/2020 0853   CREATININE 0.93 02/10/2020 0853      Component Value Date/Time   CALCIUM 8.2 (L) 02/10/2020 0853   ALKPHOS 92 02/10/2020 0853   AST 13 (L) 02/10/2020 0853   ALT 10 02/10/2020 0853   BILITOT 0.7 02/10/2020 0853       RADIOGRAPHIC STUDIES: No results found.  ASSESSMENT AND PLAN: This is a very pleasant 79 years old white male diagnosed with stage IIIb non-small cell lung cancer, adenocarcinoma  with no actionable mutations  Completed the course of concurrent chemoradiation with weekly carboplatin and paclitaxel status post 5 cycles.  He has partial response to this treatment. The patient is currently undergoing consolidation treatment with immunotherapy with Imfinzi status post 24 cycles.   The patient continues to tolerate his treatment well with no concerning adverse effects. I recommended for him to proceed with cycle #25 today as planned. I will see him back for follow-up visit in 2 weeks for evaluation before the last cycle of his consolidation immunotherapy. For the COPD, he is followed by [name]. For the valvular heart disease, he is followed by cardiology, Dr. Percival Spanish.  The patient was advised to call immediately if he has any concerning symptoms in the interval. The patient voices understanding of current disease status and treatment options and is in agreement with the current care plan.  All questions were answered. The patient knows to call the clinic with any problems, questions or concerns. We can certainly see  the patient much sooner if necessary.  Disclaimer: This note was dictated with voice recognition software. Similar sounding words can inadvertently be transcribed and may not be corrected upon review.

## 2020-02-24 NOTE — Patient Instructions (Signed)
[facility] Discharge Instructions for Patients Receiving Chemotherapy  Today you received the following chemotherapy agents: Imfinzi.  To help prevent nausea and vomiting after your treatment, we encourage you to take your nausea medication as directed.   If you develop nausea and vomiting that is not controlled by your nausea medication, call the clinic.   BELOW ARE SYMPTOMS THAT SHOULD BE REPORTED IMMEDIATELY:  *FEVER GREATER THAN 100.5 F  *CHILLS WITH OR WITHOUT FEVER  NAUSEA AND VOMITING THAT IS NOT CONTROLLED WITH YOUR NAUSEA MEDICATION  *UNUSUAL SHORTNESS OF BREATH  *UNUSUAL BRUISING OR BLEEDING  TENDERNESS IN MOUTH AND THROAT WITH OR WITHOUT PRESENCE OF ULCERS  *URINARY PROBLEMS  *BOWEL PROBLEMS  UNUSUAL RASH Items with * indicate a potential emergency and should be followed up as soon as possible.  Feel free to call the clinic should you have any questions or concerns. The clinic phone number is [phone].  Please show the CHEMO ALERT CARD at check-in to the Emergency Department and triage nurse.   

## 2020-02-27 DIAGNOSIS — J439 Emphysema, unspecified: Secondary | ICD-10-CM | POA: Diagnosis not present

## 2020-03-01 DIAGNOSIS — J90 Pleural effusion, not elsewhere classified: Secondary | ICD-10-CM | POA: Diagnosis not present

## 2020-03-01 DIAGNOSIS — J9611 Chronic respiratory failure with hypoxia: Secondary | ICD-10-CM | POA: Diagnosis not present

## 2020-03-01 DIAGNOSIS — C3492 Malignant neoplasm of unspecified part of left bronchus or lung: Secondary | ICD-10-CM | POA: Diagnosis not present

## 2020-03-01 DIAGNOSIS — J439 Emphysema, unspecified: Secondary | ICD-10-CM | POA: Diagnosis not present

## 2020-03-01 DIAGNOSIS — J432 Centrilobular emphysema: Secondary | ICD-10-CM | POA: Diagnosis not present

## 2020-03-01 DIAGNOSIS — J9600 Acute respiratory failure, unspecified whether with hypoxia or hypercapnia: Secondary | ICD-10-CM | POA: Diagnosis not present

## 2020-03-01 DIAGNOSIS — C3412 Malignant neoplasm of upper lobe, left bronchus or lung: Secondary | ICD-10-CM | POA: Diagnosis not present

## 2020-03-03 NOTE — Progress Notes (Signed)
Pharmacist Chemotherapy Monitoring - Follow Up Assessment    I verify that I have reviewed each item in the below checklist:  . Regimen for the patient is scheduled for the appropriate day and plan matches scheduled date. Marland Kitchen Appropriate non-routine labs are ordered dependent on drug ordered. . If applicable, additional medications reviewed and ordered per protocol based on lifetime cumulative doses and/or treatment regimen.   Plan for follow-up and/or issues identified: No . I-vent associated with next due treatment: No . MD and/or nursing notified: No  Robert Dixon D 03/03/2020 3:44 PM

## 2020-03-05 NOTE — Progress Notes (Signed)
[facility] OFFICE PROGRESS NOTE  Robert Arabian, MD [address] [place] [address]  DIAGNOSIS: Stage IIIB(T1b, N3, M0) non-small cell lung cancer favoring adenocarcinoma diagnosed in January 2020 and presented with left lower lobe pulmonary nodule in addition to bilateral hilar and subcarinal lymphadenopathy  PRIOR THERAPY: Concurrent chemoradiation with weekly carboplatin for an AUC of 2 and paclitaxel 45 mg/m2. Status post 5 cycles.  CURRENT THERAPY: Consolidation immunotherapy with Imfinzi 10 mg/kg IV every 2 weeks. First dose March 25, 2019.Status post25cycles.  INTERVAL HISTORY: Robert Dixon 79 y.o. male returns to the clinic for a follow up visit. The patient is feeling fair today without any concerning complaints.He continues to tolerate his treatment with immunotherapy fairly well without any concerning adverse effects except for fatigue 1 to 2 days following treatment and dry skin. He denies any recent fever, chills, night sweats, or weight loss. He reports his baseline shortness of breath with exertion/chronic hypoxemic respiratory failureandchroniccoughwhich is multifactorial. He follows closely with pulmonology as well as cardiology for this concern. He has severe aortic stenosis. He saw Dr. Valeta Dixon from pulmonology who placed a referral to palliative care. Palliative care evaluated the patient for possible PT.He denies any headache or visual changes. Heendorses dry skin, butdenies any rashes orotherskin changes. He denies any nausea, vomiting, diarrhea, or constipation but sometimes has an unsettling feeling in his stomach.He is here today for evaluation before starting cycle #26.  MEDICAL HISTORY: Past Medical History:  Diagnosis Date  . Cancer ([name])    skin-   . Complication of anesthesia   . COPD (chronic obstructive pulmonary disease) ([place])   . Dyspnea   . GERD (gastroesophageal reflux disease)   . Hemoptysis 11/20/2018   . History of hiatal hernia   . Hypertension   . Hypoxia 11/20/2018  . 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 Migraine with visual aura   . Non-small cell lung cancer ([place]) dx'd 11/20/18  . Pneumonia 11/20/2018  . PONV (postoperative nausea and vomiting)   . Pulmonary nodule 11/20/2018    ALLERGIES:  has No Known Allergies.  MEDICATIONS:  Current Outpatient Medications  Medication Sig Dispense Refill  . albuterol (PROVENTIL HFA;VENTOLIN HFA) 108 (90 Base) MCG/ACT inhaler Inhale 2 puffs into the lungs every 6 (six) hours as needed for wheezing or shortness of breath. 1 Inhaler 6  . albuterol (PROVENTIL) (2.5 MG/3ML) 0.083% nebulizer solution Take 3 mLs (2.5 mg total) by nebulization every 4 (four) hours. 360 mL 1  . BREZTRI AEROSPHERE 160-9-4.8 MCG/ACT AERO     . Cyanocobalamin (VITAMIN B 12 PO) Take 1 capsule by mouth daily. 2500 mcg    . dimenhyDRINATE (DRAMAMINE) 50 MG tablet Take 50 mg by mouth every 8 (eight) hours as needed for dizziness.     Hunt Oris (IMFINZI IV) Inject into the vein every 14 (fourteen) days.    . Multiple Vitamin (MULTIVITAMIN) tablet Take 1 tablet by mouth daily.    . pantoprazole (PROTONIX) 40 MG tablet Take 40 mg by mouth 2 (two) times daily.    . polycarbophil (FIBERCON) 625 MG tablet Take 625 mg by mouth 2 (two) times daily.     . prochlorperazine (COMPAZINE) 10 MG tablet TAKE ONE TABLET BY MOUTH EVERY 6 HOURS AS NEEDED FOR NAUSEA OR VOMITING 30 tablet 0  . tamsulosin (FLOMAX) 0.4 MG CAPS capsule     . traMADol (ULTRAM) 50 MG tablet Take 50 mg by mouth 2 (two) times daily as needed.  No current facility-administered medications for this visit.   Facility-Administered Medications Ordered in Other Visits  Medication Dose Route Frequency Provider Last Rate Last Admin  . sodium chloride flush (NS) 0.9 % injection 10 mL  10 mL Intracatheter PRN Robert Bears, MD      . sodium chloride flush (NS) 0.9 % injection 10 mL  10 mL Intracatheter PRN Robert Bears, MD   10 mL at 05/20/19 1232    SURGICAL HISTORY:  Past Surgical History:  Procedure Laterality Date  . COLONOSCOPY W/ POLYPECTOMY    . IR IMAGING GUIDED PORT INSERTION  03/14/2019  . TONSILLECTOMY    . VASECTOMY    . VIDEO BRONCHOSCOPY WITH ENDOBRONCHIAL ULTRASOUND N/A 12/06/2018   Procedure: VIDEO BRONCHOSCOPY WITH ENDOBRONCHIAL ULTRASOUND;  Surgeon: Robert Nash, DO;  Location: [facility] OR;  Service: Thoracic;  Laterality: N/A;    REVIEW OF SYSTEMS:   Review of Systems  Constitutional:Positive for fatigue.Negative for appetite change, chills, fever and unexpected weight change.  HENT: Negative for mouth sores, nosebleeds, sore throat and trouble swallowing.  Eyes: Negative for eye problems and icterus.  Respiratory:Endorses chronic coughand dyspnea on exertion.Negative for hemoptysisandwheezing. On 3 L of oxygen  Cardiovascular: Negative for chest pain and leg swelling.  Gastrointestinal: Negative for abdominal pain, constipation, diarrhea, nausea and vomiting.  Genitourinary:Endorsesdifficulty urinatingsecondary to BPH.Negative for bladder incontinence,dysuria, frequency and hematuria.  Musculoskeletal: Negative for back pain, gait problem, neck pain and neck stiffness.  Skin:Endorses skin dryness.Negative for itching and rash.  Neurological: Negative for dizziness, extremity weakness, gait problem, headaches, light-headedness and seizures.  Hematological: Negative for adenopathy. Does not bruise/bleed easily.  Psychiatric/Behavioral: Negative for confusion, depression and sleep disturbance. The patient is not nervous/anxious.     PHYSICAL EXAMINATION:  There were no vitals taken for this visit.  ECOG PERFORMANCE STATUS: 2 - Symptomatic, <50% confined to bed  Physical Exam  Constitutional: Oriented to person, place, and time and elderly male and in no distress. On 3 L of oxygen HENT:  Head: Normocephalic and atraumatic.  Mouth/Throat: Oropharynx is clear  and moist. No oropharyngeal exudate.  Eyes: Conjunctivae are normal. Right eye exhibits no discharge. Left eye exhibits no discharge. No scleral icterus.  Neck: Normal range of motion. Neck supple.  Cardiovascular: Systolic murmur heard throughout. Normal rate, regular rhythm, and intact distal pulses.   Pulmonary/Chest: Effort normal. Quiet breath sounds in all lung fields but clear. No respiratory distress. No wheezes. No rales. On 3 L of oxygen Abdominal: Soft. Bowel sounds are normal. Exhibits no distension and no mass. There is no tenderness.  Musculoskeletal: Normal range of motion. Exhibits no edema.  Lymphadenopathy:    No cervical adenopathy.  Neurological: Alert and oriented to person, place, and time. Exhibits normal muscle tone. Gait normal. Coordination normal.  Skin: Skin is warm and dry. No rash noted. Not diaphoretic. No erythema. No pallor.  Psychiatric: Mood, memory and judgment normal.  Vitals reviewed.  LABORATORY DATA: Lab Results  Component Value Date   WBC 5.8 02/24/2020   HGB 9.9 (L) 02/24/2020   HCT 30.1 (L) 02/24/2020   MCV 120.4 (H) 02/24/2020   PLT 238 02/24/2020      Chemistry      Component Value Date/Time   NA 139 02/24/2020 0849   K 4.5 02/24/2020 0849   CL 110 02/24/2020 0849   CO2 22 02/24/2020 0849   BUN 16 02/24/2020 0849   CREATININE 1.08 02/24/2020 0849      Component Value Date/Time   CALCIUM  8.4 (L) 02/24/2020 0849   ALKPHOS 95 02/24/2020 0849   AST 13 (L) 02/24/2020 0849   ALT 10 02/24/2020 0849   BILITOT 0.7 02/24/2020 0849       RADIOGRAPHIC STUDIES:  No results found.   ASSESSMENT/PLAN:  This is a very pleasant 79 year old Caucasian male diagnosed with stage IIIb non-small cell lung cancer, adenocarcinoma of the left lower lobe. He was diagnosed in January 2020. He has no actionable mutations.  The patient underwent concurrent chemoradiation with carboplatin for an AUC of 2 and paclitaxel 45 mg/m. He is status post  5 cycles. He was unable to proceed with cycle #6 secondary to thrombocytopenia.   The patient is currently undergoing consolidation immunotherapy with Imfinzi 10 mg/kg IV every 2 weeks. Status post25cycles.He has been tolerating treatment well without any adverse effects.  Labs were reviewed.We recommendthat the patient continue with his final treatment, cycle #26today as scheduled.  I will arrange for a restaging CT scan of the chest in 2-3 weeks.   We will see him back a few days following his CT scan for evaluation and to review the imaging results.   He will continue to follow with pulmonology and cardiology for his COPD and aortic stenosis.   The patient was advised to call immediately if he has any concerning symptoms in the interval. The patient voices understanding of current disease status and treatment options and is in agreement with the current care plan. All questions were answered. The patient knows to call the clinic with any problems, questions or concerns. We can certainly see the patient much sooner if necessary  No orders of the defined types were placed in this encounter.    Robert Litt L Tareka Jhaveri, PA-C 03/05/20

## 2020-03-08 ENCOUNTER — Other Ambulatory Visit: Payer: Self-pay | Admitting: Medical Oncology

## 2020-03-08 DIAGNOSIS — C3492 Malignant neoplasm of unspecified part of left bronchus or lung: Secondary | ICD-10-CM

## 2020-03-08 NOTE — Telephone Encounter (Signed)
err

## 2020-03-09 ENCOUNTER — Other Ambulatory Visit: Payer: Self-pay

## 2020-03-09 ENCOUNTER — Inpatient Hospital Stay: Payer: Medicare HMO | Attending: Internal Medicine

## 2020-03-09 ENCOUNTER — Inpatient Hospital Stay: Payer: Medicare HMO | Admitting: Physician Assistant

## 2020-03-09 ENCOUNTER — Inpatient Hospital Stay: Payer: Medicare HMO

## 2020-03-09 VITALS — BP 107/63 | HR 99 | Temp 98.2°F | Resp 19 | Ht 68.0 in | Wt 163.3 lb

## 2020-03-09 DIAGNOSIS — I10 Essential (primary) hypertension: Secondary | ICD-10-CM | POA: Diagnosis not present

## 2020-03-09 DIAGNOSIS — Z923 Personal history of irradiation: Secondary | ICD-10-CM | POA: Insufficient documentation

## 2020-03-09 DIAGNOSIS — J449 Chronic obstructive pulmonary disease, unspecified: Secondary | ICD-10-CM | POA: Insufficient documentation

## 2020-03-09 DIAGNOSIS — C3492 Malignant neoplasm of unspecified part of left bronchus or lung: Secondary | ICD-10-CM

## 2020-03-09 DIAGNOSIS — Z5112 Encounter for antineoplastic immunotherapy: Secondary | ICD-10-CM | POA: Insufficient documentation

## 2020-03-09 DIAGNOSIS — Z79899 Other long term (current) drug therapy: Secondary | ICD-10-CM | POA: Diagnosis not present

## 2020-03-09 DIAGNOSIS — R5383 Other fatigue: Secondary | ICD-10-CM | POA: Diagnosis not present

## 2020-03-09 DIAGNOSIS — R21 Rash and other nonspecific skin eruption: Secondary | ICD-10-CM | POA: Insufficient documentation

## 2020-03-09 DIAGNOSIS — Z9221 Personal history of antineoplastic chemotherapy: Secondary | ICD-10-CM | POA: Diagnosis not present

## 2020-03-09 DIAGNOSIS — K219 Gastro-esophageal reflux disease without esophagitis: Secondary | ICD-10-CM | POA: Insufficient documentation

## 2020-03-09 DIAGNOSIS — C3432 Malignant neoplasm of lower lobe, left bronchus or lung: Secondary | ICD-10-CM | POA: Insufficient documentation

## 2020-03-09 DIAGNOSIS — Z95828 Presence of other vascular implants and grafts: Secondary | ICD-10-CM

## 2020-03-09 DIAGNOSIS — I350 Nonrheumatic aortic (valve) stenosis: Secondary | ICD-10-CM | POA: Diagnosis not present

## 2020-03-09 LAB — CMP (CANCER CENTER ONLY)
ALT: 12 U/L (ref 0–44)
AST: 15 U/L (ref 15–41)
Albumin: 3.6 g/dL (ref 3.5–5.0)
Alkaline Phosphatase: 93 U/L (ref 38–126)
Anion gap: 8 (ref 5–15)
BUN: 18 mg/dL (ref 8–23)
CO2: 23 mmol/L (ref 22–32)
Calcium: 8.5 mg/dL — ABNORMAL LOW (ref 8.9–10.3)
Chloride: 110 mmol/L (ref 98–111)
Creatinine: 1.03 mg/dL (ref 0.61–1.24)
GFR, Est AFR Am: >60 mL/min (ref >60)
GFR, Estimated: >60 mL/min (ref >60)
Glucose, Bld: 123 mg/dL — ABNORMAL HIGH (ref 70–99)
Potassium: 4.5 mmol/L (ref 3.5–5.1)
Sodium: 141 mmol/L (ref 135–145)
Total Bilirubin: 0.7 mg/dL (ref 0.3–1.2)
Total Protein: 6.2 g/dL — ABNORMAL LOW (ref 6.5–8.1)

## 2020-03-09 LAB — CBC WITH DIFFERENTIAL (CANCER CENTER ONLY)
Abs Immature Granulocytes: 0.03 10*3/uL (ref 0.00–0.07)
Basophils Absolute: 0.1 10*3/uL (ref 0.0–0.1)
Basophils Relative: 2 %
Eosinophils Absolute: 0.1 10*3/uL (ref 0.0–0.5)
Eosinophils Relative: 1 %
HCT: 29.2 % — ABNORMAL LOW (ref 39.0–52.0)
Hemoglobin: 9.5 g/dL — ABNORMAL LOW (ref 13.0–17.0)
Immature Granulocytes: 1 %
Lymphocytes Relative: 12 %
Lymphs Abs: 0.7 10*3/uL (ref 0.7–4.0)
MCH: 39.3 pg — ABNORMAL HIGH (ref 26.0–34.0)
MCHC: 32.5 g/dL (ref 30.0–36.0)
MCV: 120.7 fL — ABNORMAL HIGH (ref 80.0–100.0)
Monocytes Absolute: 0.5 10*3/uL (ref 0.1–1.0)
Monocytes Relative: 9 %
Neutro Abs: 4.4 10*3/uL (ref 1.7–7.7)
Neutrophils Relative %: 75 %
Platelet Count: 213 10*3/uL (ref 150–400)
RBC: 2.42 MIL/uL — ABNORMAL LOW (ref 4.22–5.81)
RDW: 14.8 % (ref 11.5–15.5)
WBC Count: 5.8 10*3/uL (ref 4.0–10.5)
nRBC: 0 % (ref 0.0–0.2)

## 2020-03-09 LAB — TSH: TSH: 2.432 u[IU]/mL (ref 0.320–4.118)

## 2020-03-09 MED ORDER — SODIUM CHLORIDE 0.9% FLUSH
10.0000 mL | INTRAVENOUS | Status: DC | PRN
  Administered 2020-03-09: 10 mL
  Filled 2020-03-09: qty 10

## 2020-03-09 MED ORDER — HEPARIN SOD (PORK) LOCK FLUSH 100 UNIT/ML IV SOLN
500.0000 [IU] | Freq: Once | INTRAVENOUS | Status: AC | PRN
  Administered 2020-03-09: 500 [IU]
  Filled 2020-03-09: qty 5

## 2020-03-09 MED ORDER — SODIUM CHLORIDE 0.9 % IV SOLN
720.0000 mg | Freq: Once | INTRAVENOUS | Status: AC
  Administered 2020-03-09: 720 mg via INTRAVENOUS
  Filled 2020-03-09: qty 14.4

## 2020-03-09 MED ORDER — SODIUM CHLORIDE 0.9 % IV SOLN
Freq: Once | INTRAVENOUS | Status: AC
  Filled 2020-03-09: qty 250

## 2020-03-09 NOTE — Patient Instructions (Signed)

## 2020-03-09 NOTE — Patient Instructions (Signed)
[facility] Discharge Instructions for Patients Receiving Chemotherapy  Today you received the following chemotherapy agents: Imfinzi.  To help prevent nausea and vomiting after your treatment, we encourage you to take your nausea medication as directed.   If you develop nausea and vomiting that is not controlled by your nausea medication, call the clinic.   BELOW ARE SYMPTOMS THAT SHOULD BE REPORTED IMMEDIATELY:  *FEVER GREATER THAN 100.5 F  *CHILLS WITH OR WITHOUT FEVER  NAUSEA AND VOMITING THAT IS NOT CONTROLLED WITH YOUR NAUSEA MEDICATION  *UNUSUAL SHORTNESS OF BREATH  *UNUSUAL BRUISING OR BLEEDING  TENDERNESS IN MOUTH AND THROAT WITH OR WITHOUT PRESENCE OF ULCERS  *URINARY PROBLEMS  *BOWEL PROBLEMS  UNUSUAL RASH Items with * indicate a potential emergency and should be followed up as soon as possible.  Feel free to call the clinic should you have any questions or concerns. The clinic phone number is [phone].  Please show the CHEMO ALERT CARD at check-in to the Emergency Department and triage nurse.   

## 2020-03-10 ENCOUNTER — Telehealth: Payer: Self-pay | Admitting: Physician Assistant

## 2020-03-10 NOTE — Telephone Encounter (Signed)
Scheduled per los. Called and spoke with patient. Confirmed appt 

## 2020-03-12 ENCOUNTER — Telehealth: Payer: Self-pay | Admitting: Pulmonary Disease

## 2020-03-12 NOTE — Telephone Encounter (Signed)
Spoke with patient. He stated that he was seen by [name] and was advised to call back when he felt like he has fluid in or around his lungs. He was calling back because he does not exactly what symptoms to look for. I explained to him some of the symptoms for retaining fluids. He denied any arm or leg swelling. He stated that his left foot and ankle are swollen but this was nothing new. Denied any chest pain or pressure. He has a cough that is productive with white phlegm. He does have some upper stomach pressure. Denied any fevers.   He is currently on 3L of O2 and having episodes of SOB. He can walk from his bedroom and bathroom and will have to rest to catch his breath.   I did offer him an appt for an xray but he declined.   Beth, please advise since BI is not in the office nor at the hospitals today. Thanks!

## 2020-03-12 NOTE — Telephone Encounter (Signed)
He should monitor for increased shortness of breath or decreased oxygen saturation.

## 2020-03-12 NOTE — Telephone Encounter (Signed)
Spoke with pt, relayed s/s and recs.  Pt expressed understanding.  Nothing further needed at this time- will close encounter.

## 2020-03-15 ENCOUNTER — Encounter (HOSPITAL_COMMUNITY): Payer: Self-pay | Admitting: Emergency Medicine

## 2020-03-15 ENCOUNTER — Inpatient Hospital Stay (HOSPITAL_COMMUNITY)
Admission: EM | Admit: 2020-03-15 | Discharge: 2020-03-28 | DRG: 266 | Disposition: A | Discharge status: Home Health | Payer: Medicare HMO | Attending: Cardiology | Admitting: Cardiology

## 2020-03-15 ENCOUNTER — Telehealth: Payer: Self-pay | Admitting: Cardiology

## 2020-03-15 ENCOUNTER — Emergency Department (HOSPITAL_COMMUNITY): Payer: Medicare HMO

## 2020-03-15 ENCOUNTER — Other Ambulatory Visit: Payer: Self-pay

## 2020-03-15 ENCOUNTER — Inpatient Hospital Stay (HOSPITAL_COMMUNITY): Payer: Medicare HMO

## 2020-03-15 DIAGNOSIS — Z20822 Contact with and (suspected) exposure to covid-19: Secondary | ICD-10-CM | POA: Diagnosis present

## 2020-03-15 DIAGNOSIS — I110 Hypertensive heart disease with heart failure: Secondary | ICD-10-CM | POA: Diagnosis not present

## 2020-03-15 DIAGNOSIS — I272 Pulmonary hypertension, unspecified: Secondary | ICD-10-CM | POA: Diagnosis not present

## 2020-03-15 DIAGNOSIS — I2510 Atherosclerotic heart disease of native coronary artery without angina pectoris: Secondary | ICD-10-CM | POA: Diagnosis present

## 2020-03-15 DIAGNOSIS — R0689 Other abnormalities of breathing: Secondary | ICD-10-CM | POA: Diagnosis not present

## 2020-03-15 DIAGNOSIS — E663 Overweight: Secondary | ICD-10-CM | POA: Diagnosis present

## 2020-03-15 DIAGNOSIS — E785 Hyperlipidemia, unspecified: Secondary | ICD-10-CM | POA: Diagnosis present

## 2020-03-15 DIAGNOSIS — R7989 Other specified abnormal findings of blood chemistry: Secondary | ICD-10-CM | POA: Diagnosis not present

## 2020-03-15 DIAGNOSIS — J9811 Atelectasis: Secondary | ICD-10-CM | POA: Diagnosis not present

## 2020-03-15 DIAGNOSIS — J441 Chronic obstructive pulmonary disease with (acute) exacerbation: Secondary | ICD-10-CM | POA: Diagnosis not present

## 2020-03-15 DIAGNOSIS — J432 Centrilobular emphysema: Secondary | ICD-10-CM | POA: Diagnosis not present

## 2020-03-15 DIAGNOSIS — C3492 Malignant neoplasm of unspecified part of left bronchus or lung: Secondary | ICD-10-CM | POA: Diagnosis not present

## 2020-03-15 DIAGNOSIS — J449 Chronic obstructive pulmonary disease, unspecified: Secondary | ICD-10-CM | POA: Diagnosis not present

## 2020-03-15 DIAGNOSIS — Z8249 Family history of ischemic heart disease and other diseases of the circulatory system: Secondary | ICD-10-CM

## 2020-03-15 DIAGNOSIS — Z01818 Encounter for other preprocedural examination: Secondary | ICD-10-CM | POA: Diagnosis not present

## 2020-03-15 DIAGNOSIS — I10 Essential (primary) hypertension: Secondary | ICD-10-CM | POA: Diagnosis not present

## 2020-03-15 DIAGNOSIS — J439 Emphysema, unspecified: Secondary | ICD-10-CM | POA: Diagnosis present

## 2020-03-15 DIAGNOSIS — I6523 Occlusion and stenosis of bilateral carotid arteries: Secondary | ICD-10-CM | POA: Diagnosis present

## 2020-03-15 DIAGNOSIS — J90 Pleural effusion, not elsewhere classified: Secondary | ICD-10-CM | POA: Diagnosis not present

## 2020-03-15 DIAGNOSIS — I350 Nonrheumatic aortic (valve) stenosis: Secondary | ICD-10-CM | POA: Diagnosis not present

## 2020-03-15 DIAGNOSIS — J8410 Pulmonary fibrosis, unspecified: Secondary | ICD-10-CM | POA: Diagnosis present

## 2020-03-15 DIAGNOSIS — Z01810 Encounter for preprocedural cardiovascular examination: Secondary | ICD-10-CM | POA: Diagnosis not present

## 2020-03-15 DIAGNOSIS — Z952 Presence of prosthetic heart valve: Secondary | ICD-10-CM | POA: Diagnosis not present

## 2020-03-15 DIAGNOSIS — D696 Thrombocytopenia, unspecified: Secondary | ICD-10-CM | POA: Diagnosis present

## 2020-03-15 DIAGNOSIS — N179 Acute kidney failure, unspecified: Secondary | ICD-10-CM | POA: Diagnosis present

## 2020-03-15 DIAGNOSIS — Z923 Personal history of irradiation: Secondary | ICD-10-CM

## 2020-03-15 DIAGNOSIS — Z66 Do not resuscitate: Secondary | ICD-10-CM | POA: Diagnosis not present

## 2020-03-15 DIAGNOSIS — I429 Cardiomyopathy, unspecified: Secondary | ICD-10-CM | POA: Diagnosis present

## 2020-03-15 DIAGNOSIS — I472 Ventricular tachycardia: Secondary | ICD-10-CM | POA: Diagnosis not present

## 2020-03-15 DIAGNOSIS — J810 Acute pulmonary edema: Secondary | ICD-10-CM | POA: Diagnosis not present

## 2020-03-15 DIAGNOSIS — T380X5A Adverse effect of glucocorticoids and synthetic analogues, initial encounter: Secondary | ICD-10-CM | POA: Diagnosis present

## 2020-03-15 DIAGNOSIS — Z9981 Dependence on supplemental oxygen: Secondary | ICD-10-CM

## 2020-03-15 DIAGNOSIS — I351 Nonrheumatic aortic (valve) insufficiency: Secondary | ICD-10-CM | POA: Diagnosis not present

## 2020-03-15 DIAGNOSIS — Z85118 Personal history of other malignant neoplasm of bronchus and lung: Secondary | ICD-10-CM

## 2020-03-15 DIAGNOSIS — I509 Heart failure, unspecified: Secondary | ICD-10-CM | POA: Diagnosis not present

## 2020-03-15 DIAGNOSIS — Z9221 Personal history of antineoplastic chemotherapy: Secondary | ICD-10-CM

## 2020-03-15 DIAGNOSIS — Z87891 Personal history of nicotine dependence: Secondary | ICD-10-CM

## 2020-03-15 DIAGNOSIS — Z85828 Personal history of other malignant neoplasm of skin: Secondary | ICD-10-CM

## 2020-03-15 DIAGNOSIS — I5022 Chronic systolic (congestive) heart failure: Secondary | ICD-10-CM | POA: Diagnosis not present

## 2020-03-15 DIAGNOSIS — J9600 Acute respiratory failure, unspecified whether with hypoxia or hypercapnia: Secondary | ICD-10-CM | POA: Diagnosis not present

## 2020-03-15 DIAGNOSIS — J9601 Acute respiratory failure with hypoxia: Secondary | ICD-10-CM

## 2020-03-15 DIAGNOSIS — I313 Pericardial effusion (noninflammatory): Secondary | ICD-10-CM | POA: Diagnosis present

## 2020-03-15 DIAGNOSIS — I5023 Acute on chronic systolic (congestive) heart failure: Secondary | ICD-10-CM | POA: Diagnosis present

## 2020-03-15 DIAGNOSIS — G43109 Migraine with aura, not intractable, without status migrainosus: Secondary | ICD-10-CM | POA: Diagnosis present

## 2020-03-15 DIAGNOSIS — Z9889 Other specified postprocedural states: Secondary | ICD-10-CM

## 2020-03-15 DIAGNOSIS — K219 Gastro-esophageal reflux disease without esophagitis: Secondary | ICD-10-CM | POA: Diagnosis present

## 2020-03-15 DIAGNOSIS — J948 Other specified pleural conditions: Secondary | ICD-10-CM | POA: Diagnosis not present

## 2020-03-15 DIAGNOSIS — Z006 Encounter for examination for normal comparison and control in clinical research program: Secondary | ICD-10-CM | POA: Diagnosis not present

## 2020-03-15 DIAGNOSIS — D72829 Elevated white blood cell count, unspecified: Secondary | ICD-10-CM | POA: Diagnosis present

## 2020-03-15 DIAGNOSIS — I352 Nonrheumatic aortic (valve) stenosis with insufficiency: Secondary | ICD-10-CM | POA: Diagnosis present

## 2020-03-15 DIAGNOSIS — I779 Disorder of arteries and arterioles, unspecified: Secondary | ICD-10-CM

## 2020-03-15 DIAGNOSIS — R0902 Hypoxemia: Secondary | ICD-10-CM | POA: Diagnosis not present

## 2020-03-15 DIAGNOSIS — J9621 Acute and chronic respiratory failure with hypoxia: Secondary | ICD-10-CM | POA: Diagnosis not present

## 2020-03-15 DIAGNOSIS — J918 Pleural effusion in other conditions classified elsewhere: Secondary | ICD-10-CM | POA: Diagnosis not present

## 2020-03-15 DIAGNOSIS — I3139 Other pericardial effusion (noninflammatory): Secondary | ICD-10-CM | POA: Diagnosis present

## 2020-03-15 DIAGNOSIS — J9611 Chronic respiratory failure with hypoxia: Secondary | ICD-10-CM | POA: Diagnosis not present

## 2020-03-15 DIAGNOSIS — C3412 Malignant neoplasm of upper lobe, left bronchus or lung: Secondary | ICD-10-CM | POA: Diagnosis not present

## 2020-03-15 DIAGNOSIS — Z79899 Other long term (current) drug therapy: Secondary | ICD-10-CM

## 2020-03-15 DIAGNOSIS — I5021 Acute systolic (congestive) heart failure: Secondary | ICD-10-CM | POA: Diagnosis not present

## 2020-03-15 DIAGNOSIS — R0602 Shortness of breath: Secondary | ICD-10-CM | POA: Diagnosis not present

## 2020-03-15 DIAGNOSIS — D72820 Lymphocytosis (symptomatic): Secondary | ICD-10-CM | POA: Diagnosis not present

## 2020-03-15 DIAGNOSIS — Z6823 Body mass index (BMI) 23.0-23.9, adult: Secondary | ICD-10-CM

## 2020-03-15 DIAGNOSIS — I471 Supraventricular tachycardia: Secondary | ICD-10-CM | POA: Diagnosis not present

## 2020-03-15 DIAGNOSIS — J181 Lobar pneumonia, unspecified organism: Secondary | ICD-10-CM | POA: Diagnosis not present

## 2020-03-15 DIAGNOSIS — I340 Nonrheumatic mitral (valve) insufficiency: Secondary | ICD-10-CM | POA: Diagnosis not present

## 2020-03-15 DIAGNOSIS — J80 Acute respiratory distress syndrome: Secondary | ICD-10-CM | POA: Diagnosis not present

## 2020-03-15 DIAGNOSIS — R000 Tachycardia, unspecified: Secondary | ICD-10-CM | POA: Diagnosis not present

## 2020-03-15 HISTORY — DX: Unspecified malignant neoplasm of skin, unspecified: C44.90

## 2020-03-15 LAB — CBC WITH DIFFERENTIAL/PLATELET
Abs Immature Granulocytes: 0.07 10*3/uL (ref 0.00–0.07)
Basophils Absolute: 0.2 10*3/uL — ABNORMAL HIGH (ref 0.0–0.1)
Basophils Relative: 2 %
Eosinophils Absolute: 0 10*3/uL (ref 0.0–0.5)
Eosinophils Relative: 0 %
HCT: 35.7 % — ABNORMAL LOW (ref 39.0–52.0)
Hemoglobin: 11.4 g/dL — ABNORMAL LOW (ref 13.0–17.0)
Immature Granulocytes: 1 %
Lymphocytes Relative: 6 %
Lymphs Abs: 0.7 10*3/uL (ref 0.7–4.0)
MCH: 39.7 pg — ABNORMAL HIGH (ref 26.0–34.0)
MCHC: 31.9 g/dL (ref 30.0–36.0)
MCV: 124.4 fL — ABNORMAL HIGH (ref 80.0–100.0)
Monocytes Absolute: 0.5 10*3/uL (ref 0.1–1.0)
Monocytes Relative: 4 %
Neutro Abs: 9.8 10*3/uL — ABNORMAL HIGH (ref 1.7–7.7)
Neutrophils Relative %: 87 %
Platelets: 354 10*3/uL (ref 150–400)
RBC: 2.87 MIL/uL — ABNORMAL LOW (ref 4.22–5.81)
RDW: 15.8 % — ABNORMAL HIGH (ref 11.5–15.5)
WBC: 11.3 10*3/uL — ABNORMAL HIGH (ref 4.0–10.5)
nRBC: 0 % (ref 0.0–0.2)

## 2020-03-15 LAB — BLOOD GAS, ARTERIAL
Acid-base deficit: 2.8 mmol/L — ABNORMAL HIGH (ref 0.0–2.0)
Bicarbonate: 20.8 mmol/L (ref 20.0–28.0)
Drawn by: 441261
FIO2: 50
O2 Saturation: 92.4 %
Patient temperature: 98.6
pCO2 arterial: 33.9 mmHg (ref 32.0–48.0)
pH, Arterial: 7.405 (ref 7.350–7.450)
pO2, Arterial: 69.7 mmHg — ABNORMAL LOW (ref 83.0–108.0)

## 2020-03-15 LAB — BASIC METABOLIC PANEL
Anion gap: 13 (ref 5–15)
BUN: 26 mg/dL — ABNORMAL HIGH (ref 8–23)
CO2: 22 mmol/L (ref 22–32)
Calcium: 8.8 mg/dL — ABNORMAL LOW (ref 8.9–10.3)
Chloride: 108 mmol/L (ref 98–111)
Creatinine, Ser: 1.18 mg/dL (ref 0.61–1.24)
GFR calc Af Amer: >60 mL/min (ref >60)
GFR calc non Af Amer: 59 mL/min — ABNORMAL LOW (ref >60)
Glucose, Bld: 247 mg/dL — ABNORMAL HIGH (ref 70–99)
Potassium: 4.1 mmol/L (ref 3.5–5.1)
Sodium: 143 mmol/L (ref 135–145)

## 2020-03-15 LAB — POC SARS CORONAVIRUS 2 AG -  ED: SARS Coronavirus 2 Ag: NEGATIVE

## 2020-03-15 LAB — RESPIRATORY PANEL BY RT PCR (FLU A&B, COVID)
Influenza A by PCR: NEGATIVE
Influenza B by PCR: NEGATIVE
SARS Coronavirus 2 by RT PCR: NEGATIVE

## 2020-03-15 LAB — TROPONIN I (HIGH SENSITIVITY)
Troponin I (High Sensitivity): 85 ng/L — ABNORMAL HIGH (ref <18)
Troponin I (High Sensitivity): 127 ng/L (ref <18)
Troponin I (High Sensitivity): 134 ng/L (ref <18)

## 2020-03-15 LAB — BRAIN NATRIURETIC PEPTIDE: B Natriuretic Peptide: 1080.5 pg/mL — ABNORMAL HIGH (ref 0.0–100.0)

## 2020-03-15 LAB — PROCALCITONIN: Procalcitonin: 0.13 ng/mL

## 2020-03-15 LAB — SEDIMENTATION RATE: Sed Rate: 94 mm/hr — ABNORMAL HIGH (ref 0–16)

## 2020-03-15 LAB — MRSA PCR SCREENING: MRSA by PCR: NEGATIVE

## 2020-03-15 LAB — LACTIC ACID, PLASMA
Lactic Acid, Venous: 1.8 mmol/L (ref 0.5–1.9)
Lactic Acid, Venous: 1.5 mmol/L (ref 0.5–1.9)

## 2020-03-15 MED ORDER — ACETAMINOPHEN 650 MG RE SUPP: 650.0000 mg | Freq: Four times a day (QID) | RECTAL | Status: DC | PRN

## 2020-03-15 MED ORDER — BUDESONIDE 0.5 MG/2ML IN SUSP
0.5000 mg | Freq: Two times a day (BID) | RESPIRATORY_TRACT | Status: DC
  Administered 2020-03-15 – 2020-03-24 (×18): 0.5 mg via RESPIRATORY_TRACT
  Filled 2020-03-15 (×18): qty 2

## 2020-03-15 MED ORDER — TAMSULOSIN HCL 0.4 MG PO CAPS
0.4000 mg | ORAL_CAPSULE | Freq: Every day | ORAL | Status: DC
  Administered 2020-03-16 – 2020-03-28 (×12): 0.4 mg via ORAL
  Filled 2020-03-15 (×12): qty 1

## 2020-03-15 MED ORDER — METHYLPREDNISOLONE SODIUM SUCC 125 MG IJ SOLR
125.0000 mg | Freq: Once | INTRAMUSCULAR | Status: AC
  Administered 2020-03-15: 125 mg via INTRAVENOUS
  Filled 2020-03-15: qty 2

## 2020-03-15 MED ORDER — PANTOPRAZOLE SODIUM 40 MG PO TBEC
40.0000 mg | DELAYED_RELEASE_TABLET | Freq: Two times a day (BID) | ORAL | Status: DC
  Administered 2020-03-16 – 2020-03-28 (×24): 40 mg via ORAL
  Filled 2020-03-15 (×25): qty 1

## 2020-03-15 MED ORDER — FUROSEMIDE 10 MG/ML IJ SOLN
20.0000 mg | Freq: Once | INTRAMUSCULAR | Status: AC
  Administered 2020-03-16: 20 mg via INTRAVENOUS
  Filled 2020-03-15: qty 2

## 2020-03-15 MED ORDER — TRAMADOL HCL 50 MG PO TABS
50.0000 mg | ORAL_TABLET | Freq: Two times a day (BID) | ORAL | Status: DC | PRN
  Administered 2020-03-16: 50 mg via ORAL
  Filled 2020-03-15: qty 1

## 2020-03-15 MED ORDER — HEPARIN (PORCINE) 25000 UT/250ML-% IV SOLN
1000.0000 [IU]/h | INTRAVENOUS | Status: DC
  Administered 2020-03-15: 900 [IU]/h via INTRAVENOUS
  Filled 2020-03-15: qty 250

## 2020-03-15 MED ORDER — CALCIUM POLYCARBOPHIL 625 MG PO TABS
625.0000 mg | ORAL_TABLET | Freq: Two times a day (BID) | ORAL | Status: DC
  Administered 2020-03-16 – 2020-03-28 (×20): 625 mg via ORAL
  Filled 2020-03-15 (×31): qty 1

## 2020-03-15 MED ORDER — ENOXAPARIN SODIUM 120 MG/0.8ML ~~LOC~~ SOLN: 1.5000 mg/kg | SUBCUTANEOUS | Status: DC

## 2020-03-15 MED ORDER — FUROSEMIDE 10 MG/ML IJ SOLN
20.0000 mg | Freq: Once | INTRAMUSCULAR | Status: AC
  Administered 2020-03-15: 20 mg via INTRAVENOUS
  Filled 2020-03-15: qty 4

## 2020-03-15 MED ORDER — CHLORHEXIDINE GLUCONATE 0.12 % MT SOLN
15.0000 mL | Freq: Two times a day (BID) | OROMUCOSAL | Status: DC
  Administered 2020-03-15 – 2020-03-25 (×18): 15 mL via OROMUCOSAL
  Filled 2020-03-15 (×19): qty 15

## 2020-03-15 MED ORDER — ONDANSETRON HCL 4 MG/2ML IJ SOLN: 4.0000 mg | Freq: Four times a day (QID) | INTRAMUSCULAR | Status: DC | PRN

## 2020-03-15 MED ORDER — ORAL CARE MOUTH RINSE
15.0000 mL | Freq: Two times a day (BID) | OROMUCOSAL | Status: DC
  Administered 2020-03-16 – 2020-03-22 (×6): 15 mL via OROMUCOSAL

## 2020-03-15 MED ORDER — ACETAMINOPHEN 325 MG PO TABS
650.0000 mg | ORAL_TABLET | Freq: Four times a day (QID) | ORAL | Status: DC | PRN
  Filled 2020-03-15 (×2): qty 2

## 2020-03-15 MED ORDER — METHYLPREDNISOLONE SODIUM SUCC 40 MG IJ SOLR
40.0000 mg | Freq: Four times a day (QID) | INTRAMUSCULAR | Status: DC
  Administered 2020-03-15 – 2020-03-19 (×16): 40 mg via INTRAVENOUS
  Filled 2020-03-15 (×16): qty 1

## 2020-03-15 MED ORDER — LIP MEDEX EX OINT
TOPICAL_OINTMENT | CUTANEOUS | Status: DC | PRN
  Filled 2020-03-15 (×2): qty 7

## 2020-03-15 MED ORDER — IPRATROPIUM-ALBUTEROL 0.5-2.5 (3) MG/3ML IN SOLN: 3.0000 mL | Freq: Four times a day (QID) | RESPIRATORY_TRACT | Status: DC

## 2020-03-15 MED ORDER — DIMENHYDRINATE 50 MG PO TABS
50.0000 mg | ORAL_TABLET | Freq: Three times a day (TID) | ORAL | Status: DC | PRN
  Filled 2020-03-15: qty 1

## 2020-03-15 MED ORDER — IPRATROPIUM BROMIDE 0.02 % IN SOLN
0.5000 mg | Freq: Four times a day (QID) | RESPIRATORY_TRACT | Status: DC
  Administered 2020-03-15 – 2020-03-17 (×7): 0.5 mg via RESPIRATORY_TRACT
  Filled 2020-03-15 (×7): qty 2.5

## 2020-03-15 MED ORDER — SODIUM CHLORIDE 0.9 % IV BOLUS
1000.0000 mL | Freq: Once | INTRAVENOUS | Status: AC
  Administered 2020-03-15: 1000 mL via INTRAVENOUS

## 2020-03-15 MED ORDER — CHLORHEXIDINE GLUCONATE CLOTH 2 % EX PADS
6.0000 | MEDICATED_PAD | Freq: Every day | CUTANEOUS | Status: DC
  Administered 2020-03-15 – 2020-03-28 (×13): 6 via TOPICAL

## 2020-03-15 MED ORDER — ALBUTEROL SULFATE (2.5 MG/3ML) 0.083% IN NEBU
2.5000 mg | INHALATION_SOLUTION | RESPIRATORY_TRACT | Status: DC | PRN
  Administered 2020-03-26: 2.5 mg via RESPIRATORY_TRACT
  Filled 2020-03-15: qty 3

## 2020-03-15 MED ORDER — ARFORMOTEROL TARTRATE 15 MCG/2ML IN NEBU
15.0000 ug | INHALATION_SOLUTION | Freq: Two times a day (BID) | RESPIRATORY_TRACT | Status: DC
  Administered 2020-03-16 – 2020-03-24 (×17): 15 ug via RESPIRATORY_TRACT
  Filled 2020-03-15 (×20): qty 2

## 2020-03-15 MED ORDER — ENOXAPARIN SODIUM 40 MG/0.4ML ~~LOC~~ SOLN: 40.0000 mg | SUBCUTANEOUS | Status: DC

## 2020-03-15 MED ORDER — SODIUM CHLORIDE 0.9 % IV SOLN
500.0000 mg | INTRAVENOUS | Status: DC
  Administered 2020-03-15: 500 mg via INTRAVENOUS
  Filled 2020-03-15: qty 500

## 2020-03-15 MED ORDER — ONDANSETRON HCL 4 MG PO TABS: 4.0000 mg | ORAL_TABLET | Freq: Four times a day (QID) | ORAL | Status: DC | PRN

## 2020-03-15 MED ORDER — HEPARIN BOLUS VIA INFUSION
4000.0000 [IU] | Freq: Once | INTRAVENOUS | Status: AC
  Administered 2020-03-15: 4000 [IU] via INTRAVENOUS
  Filled 2020-03-15: qty 4000

## 2020-03-15 NOTE — Progress Notes (Signed)
[facility] for IV heparin Indication: chest pain/ACS  No Known Allergies  Patient Measurements:   Heparin Dosing Weight: TBW  Vital Signs: Temp: 98.7 F (37.1 C) (04/19 1142) Temp Source: Rectal (04/19 1142) BP: 112/71 (04/19 1900) Pulse Rate: 115 (04/19 1900)  Labs: Recent Labs    03/15/20 1213 03/15/20 1537  HGB 11.4*  --   HCT 35.7*  --   PLT 354  --   CREATININE 1.18  --   TROPONINIHS 85* 127*    Estimated Creatinine Clearance: 49.9 mL/min (by C-G formula based on SCr of 1.18 mg/dL).   Medical History: Past Medical History:  Diagnosis Date  . Cancer ([place])    skin-   . Complication of anesthesia   . COPD (chronic obstructive pulmonary disease) ([place])   . Dyspnea   . GERD (gastroesophageal reflux disease)   . Hemoptysis 11/20/2018  . History of hiatal hernia   . Hypertension   . Hypoxia 11/20/2018  . 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 Migraine with visual aura   . Non-small cell lung cancer ([place]) dx'd 11/20/18  . Pneumonia 11/20/2018  . PONV (postoperative nausea and vomiting)   . Pulmonary nodule 11/20/2018    Medications:  (Not in a hospital admission)  Scheduled:  . arformoterol  15 mcg Nebulization BID  . budesonide (PULMICORT) nebulizer solution  0.5 mg Nebulization BID  . [START ON 03/16/2020] furosemide  20 mg Intravenous Once  . heparin  4,000 Units Intravenous Once  . ipratropium  0.5 mg Nebulization Q6H  . methylPREDNISolone (SOLU-MEDROL) injection  40 mg Intravenous Q6H  . pantoprazole  40 mg Oral BID  . polycarbophil  625 mg Oral BID  . tamsulosin  0.4 mg Oral Daily   Infusions:  . azithromycin    . heparin      Assessment: 30 yoM with PMH Lung CA on immunoTx, COPD, admitted w/ AECOPD +/- AECHF. Troponins noted to be elevated; likely demand ischemia, but Cardiologist on call recommending anticoagulation for now. Initially ordered Lovenox, but CCM is planning for thoracentesis soon so will start  IV heparin instead.   Baseline INR, aPTT: not done  Prior anticoagulation: none  Significant events:  Today, 03/15/2020:  CBC: Hgb low but improved  No bleeding or infusion issues per nursing  SCr slightly elevated  Goal of Therapy: Heparin level 0.3-0.7 units/ml Monitor platelets by anticoagulation protocol: Yes  Plan:  Heparin 4000 units IV bolus x 1  Heparin 900 units/hr IV infusion  Check heparin level 8 hrs after start  Daily CBC, daily heparin level once stable  Monitor for signs of bleeding or thrombosis  Reuel Boom, PharmD, BCPS [phone] 03/15/2020, 7:23 PM

## 2020-03-15 NOTE — ED Triage Notes (Signed)
Patient has been SOB for a week. He is chronically on 3L of O2 at home. EMS also noted diminished lung sounds and tachycardia. O2 sats at home was 70% until put on a non re breather; sats increased to 95%. He has had 5 mg Albuterol and 5mg  Atrovent. Currently A&O x 4. Hx: Lung CA and COPD.   EMS vitals: 146/108 BP 130 HR 40 Resp Rate 96 % O2 sat on non rebreather

## 2020-03-15 NOTE — H&P (Addendum)
History and Physical  Robert Dixon HMC:[id number] DOB: 11-16-1941 DOA: 03/15/2020   PCP: Robert Arabian, MD   Patient coming from: Home  Chief Complaint: Shortness of breath  HPI: Robert Dixon is a 79 y.o. male with medical history significant for lung cancer, COPD on chronic 3 L oxygen being admitted to the hospital with COPD exacerbation and suspected heart failure.  Patient tells me he has had progressive shortness of breath for the last several days, today he called his sons who called EMS because he was having trouble walking even from the bedroom to the bathroom.  Denies any fevers, chest pain or significant cough.  He has a history of stage IIIb non-small cell lung cancer, previously did concurrent chemoradiation and chemotherapy, currently doing consolidation immunotherapy.  Apparently at home he was saturating 70% on 3 L nasal cannula oxygen.  On arrival here in the emergency department he was in quite severe distress and was immediately placed on BiPAP, after about 2 hours of BiPAP ABG was checked and was unremarkable.  Patient is feeling significantly better now after also receiving dose of Solu-Medrol and Lasix in the emergency department.   Review of Systems: Please see HPI for pertinent positives and negatives. A complete 10 system review of systems are otherwise negative.  Past Medical History:  Diagnosis Date  . Cancer ([place])    skin-   . Complication of anesthesia   . COPD (chronic obstructive pulmonary disease) ([place])   . Dyspnea   . GERD (gastroesophageal reflux disease)   . Hemoptysis 11/20/2018  . History of hiatal hernia   . Hypertension   . Hypoxia 11/20/2018  . 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 Migraine with visual aura   . Non-small cell lung cancer ([place]) dx'd 11/20/18  . Pneumonia 11/20/2018  . PONV (postoperative nausea and vomiting)   . Pulmonary nodule 11/20/2018   Past Surgical History:  Procedure Laterality Date  . COLONOSCOPY W/ POLYPECTOMY     . IR IMAGING GUIDED PORT INSERTION  03/14/2019  . TONSILLECTOMY    . VASECTOMY    . VIDEO BRONCHOSCOPY WITH ENDOBRONCHIAL ULTRASOUND N/A 12/06/2018   Procedure: VIDEO BRONCHOSCOPY WITH ENDOBRONCHIAL ULTRASOUND;  Surgeon: Garner Nash, DO;  Location: [facility] OR;  Service: Thoracic;  Laterality: N/A;    Social History:  reports that he quit smoking about 21 years ago. His smoking use included cigarettes. He quit after 30.00 years of use. He has never used smokeless tobacco. He reports that he does not drink alcohol or use drugs.   No Known Allergies  Family History  Problem Relation Age of Onset  . Cancer - Lung Mother   . Hypertension Father   . CVA Father   . Cancer - Cervical Sister      Prior to Admission medications   Medication Sig Start Date End Date Taking? Authorizing Provider  albuterol (PROVENTIL HFA;VENTOLIN HFA) 108 (90 Base) MCG/ACT inhaler Inhale 2 puffs into the lungs every 6 (six) hours as needed for wheezing or shortness of breath. 12/05/18   Icard, Leory Plowman L, DO  albuterol (PROVENTIL) (2.5 MG/3ML) 0.083% nebulizer solution Take 3 mLs (2.5 mg total) by nebulization every 4 (four) hours. 02/04/20   Garner Nash, DO  BREZTRI AEROSPHERE 160-9-4.8 MCG/ACT AERO  11/10/19   [provider]  Cyanocobalamin (VITAMIN B 12 PO) Take 1 capsule by mouth daily. 2500 mcg    [provider]  dimenhyDRINATE (DRAMAMINE) 50 MG tablet Take 50 mg by mouth every 8 (eight)  hours as needed for dizziness.     [provider]  Durvalumab (IMFINZI IV) Inject into the vein every 14 (fourteen) days.    [provider]  Multiple Vitamin (MULTIVITAMIN) tablet Take 1 tablet by mouth daily.    [provider]  pantoprazole (PROTONIX) 40 MG tablet Take 40 mg by mouth 2 (two) times daily.    [provider]  polycarbophil (FIBERCON) 625 MG tablet Take 625 mg by mouth 2 (two) times daily.     [provider]  prochlorperazine (COMPAZINE) 10 MG  tablet TAKE ONE TABLET BY MOUTH EVERY 6 HOURS AS NEEDED FOR NAUSEA OR VOMITING 02/11/19   [name], MD  tamsulosin [facility]) 0.4 MG CAPS capsule  10/29/19   [provider]  traMADol (ULTRAM) 50 MG tablet Take 50 mg by mouth 2 (two) times daily as needed.    [provider]    Physical Exam: BP 119/74   Pulse (!) 111   Temp 98.7 F (37.1 C) (Rectal)   Resp 20   SpO2 99%   General:  Alert, oriented, calm, in no acute distress, currently still on BiPAP able to speak in full sentences Eyes: EOMI, clear conjuctivae, white sclerea Neck: supple, no masses, trachea mildline  Cardiovascular: RRR, no murmurs or rubs, no peripheral edema  Respiratory: Difficult examination, breath sounds are very distant, he is not tachypneic, not using any accessory muscles, able to speak in full sentences.  No wheezing or rhonchi are currently heard. Abdomen: soft, nontender, nondistended, normal bowel tones heard  Skin: dry, no rashes  Musculoskeletal: no joint effusions, normal range of motion  Psychiatric: appropriate affect, normal speech  Neurologic: extraocular muscles intact, clear speech, moving all extremities with intact sensorium            Labs on Admission:  Basic Metabolic Panel: Recent Labs  Lab 03/09/20 1030 03/15/20 1213  NA 141 143  K 4.5 4.1  CL 110 108  CO2 23 22  GLUCOSE 123* 247*  BUN 18 26*  CREATININE 1.03 1.18  CALCIUM 8.5* 8.8*   Liver Function Tests: Recent Labs  Lab 03/09/20 1030  AST 15  ALT 12  ALKPHOS 93  BILITOT 0.7  PROT 6.2*  ALBUMIN 3.6   No results for input(s): LIPASE, AMYLASE in the last 168 hours. No results for input(s): AMMONIA in the last 168 hours. CBC: Recent Labs  Lab 03/09/20 1030 03/15/20 1213  WBC 5.8 11.3*  NEUTROABS 4.4 9.8*  HGB 9.5* 11.4*  HCT 29.2* 35.7*  MCV 120.7* 124.4*  PLT 213 354   Cardiac Enzymes: No results for input(s): CKTOTAL, CKMB, CKMBINDEX, TROPONINI in the last 168 hours.  BNP (last  3 results) Recent Labs    03/15/20 1213  BNP 1,080.5*    ProBNP (last 3 results) No results for input(s): PROBNP in the last 8760 hours.  CBG: No results for input(s): GLUCAP in the last 168 hours.  Radiological Exams on Admission: DG Chest Port 1 View  Result Date: 03/15/2020 CLINICAL DATA:  Increasing shortness of breath for the past week. History of lung cancer and COPD. EXAM: PORTABLE CHEST 1 VIEW COMPARISON:  CT chest dated December 01, 2019. Chest x-ray dated October 08, 2019. FINDINGS: Unchanged right chest wall port catheter. The cardiac silhouette is increasingly obscured. Normal mediastinal contours. New diffuse interstitial thickening with increasing now moderate left and new small right pleural effusions. Left greater than right basilar opacities, favoring atelectasis. No pneumothorax. No acute osseous abnormality. IMPRESSION: New  interstitial pulmonary edema with increased moderate left and new small right pleural effusions. Electronically Signed   By: Titus Dubin M.D.   On: 03/15/2020 12:51   Assessment/Plan Present on Admission: . COPD exacerbation ([place])  79 year old male with a history of COPD chronically on 3 L nasal cannula oxygen, as well as stage IIIb non-small cell lung cancer being admitted to the hospital with severe COPD exacerbation now improved on BiPAP, also with left pleural effusion. -Inpatient admission -Continue BiPAP support as needed -Continue IV steroids -Scheduled and as needed breathing treatments -Patient pulmonary input  COPD exacerbation-he is chronically on 3 L nasal cannula oxygen, treating exacerbation as above.  His symptoms have improved significantly after being placed on BiPAP in the emergency department. Treating as above.  Left pleural effusion-with elevated BNP, presentation most consistent with heart failure, though his last echo showed normal EF and no mention of diastolic dysfunction.  He does however have history of moderate to  severe aortic valve stenosis. -Received Lasix in the emergency department -discussed with PCCM, will hold off on thoracentesis for now  Abnormal Troponin - rising, possibly due to demand ischemia from his heart failure and significant respiratory distress at the time of admission - continue to trend troponin - give therapeutic Lovenox since no plans for thoracentesis at this time - discussed with cardiology on call  BPH-continue home Flomax  DVT prophylaxis: Receiving therapeutic Lovenox  Code Status: DNR  Family Communication: No family present.  Disposition Plan: Likely discharge home when stable.  Consults called: Pulmonology  Admission status: Inpatient  Time spent: 36 minutes  Shaiden Aldous Marry Guan MD [facility] Pager [phone]  If 7PM-7AM, please contact night-coverage [email or web address] Password [facility]  03/15/2020, 4:15 PM

## 2020-03-15 NOTE — ED Notes (Signed)
Date and time results received: 03/15/20  1655  Test: Troponin  Critical Value: 127  Name of Provider Notified: Hollice Gong  Orders Received? Or Actions Taken?: none at this time

## 2020-03-15 NOTE — Telephone Encounter (Signed)
New Message:     Son called and wanted Dr Percival Spanish to know that pt was just taken today by EMS to [facility] ER for breathing problems.

## 2020-03-15 NOTE — Progress Notes (Signed)
Attempted to take patient off BiPAP after ABG was obtained. Patient still ShOB and says his breathing still is not very good off the BiPAP. BiPAP placed back on patient. Dr. Gilford Raid made aware.

## 2020-03-15 NOTE — Consult Note (Signed)
NAME:  Robert Dixon, MRN:  [id number], DOB:  1941-01-06, LOS: 0 ADMISSION DATE:  03/15/2020, CONSULTATION DATE:  4/19 REFERRING MD:  Dr. Gilford Raid, ER MD, CHIEF COMPLAINT:  Dyspnea   Brief History   79 y/o M, with non-small cell lung cancer admitted 4/19 with reports of increasing dyspnea.    History of present illness   79 y/o M who presented to [facility] ER on 4/19 with reports of increasing dyspnea and lower extremity swelling.    The patient has known Stage IIIb (T1b, N3, M0) non-small cell lung cancer favoring adenocarcinoma that was diagnosed in January 2020.  He completed concurrent chemoradiation with carboplatin, paclitaxel for 5 cycles.  He is now on immunotherapy with Imfinzi (first dose March 25, 2019). Additionally, he has known severe aortic stenosis that is followed closely by Dr. Percival Spanish and felt not to be urgent in nature for valve replacement.  The patient also has a moderate pericardial effusion.  In November of 2020, he underwent a thoracentesis with 500 ml of clear pleural fluid removed. Fluid analysis was negative for malignant cells, total nucleated cells 1,555 with reactive properties.  Office notes from [name] on 02/18/20 express concern for possible depression.   His daughter-in-law provides 22 of information today. She indicates he has COPD, CHF and aortic stenosis.  Recently finished his immunotherapy.  She called to check on him this am and noted he was very emotional stating he didn't know how his family was "going to care for him".  Her husband went to check in on him and found him to have saturations in the 70's.  They administered an albuterol treatment and called 911.  She notes his saturations have been doing well running 92-94% on 2-3L.  Noting at times he will panic with dyspnea on exertion.  When she spoke with him on the phone this am, she thought he might be slightly confused.    On arrival to the ER, he was placed on BiPAP for increased work of breathing  with significant improvement. Initial ABG 7.405 / 33.9 / 69.7 / 20.8.  Initial CXR notable for interstitial edema, moderate left effusion.  Labs - Na 143, K 4.1, glucose 247, BUN 26, sr cr 1.18, BNP 1,080, troponin 85, lactic acid 1.8 > 1.5, WBC 11.3, Hgb 11.4 and platelets 354.  He reports occasional green sputum but it has not changed in color or volume.  Denies fevers / chills, n/v/d.  Indicates he is vaccinated for COVID and lives alone.  Reports increased LE edema in the last two weeks.   PCCM called for evaluation.    Past Medical History  Pulmonary Nodule Stage IIIb Non-small cell Lung Cancer / favors adenocarcinoma, on Imfinzi (first dose 03/25/19) Chronic hypoxic respiratory failure  COPD Severe Aortic Stenosis  Pericardial Effusion  GERD Hiatal Hernia  HTN  Migraines   Significant Hospital Events   4/19 Admit with SOB  Consults:  PCCM   Procedures:    Significant Diagnostic Tests:   CXR 4/19 >> new interstitial pulmonary edema, moderate left effusion  Sed Rate 4/19 >>   PCT 4/19 >>   Micro Data:  COVID 4/19 >> negative  Influenza A/B 4/19 >> negative   Antimicrobials:  Azithromycin 4/19 >>   Interim history/subjective:  Pt reports feeling much better with use of bipap.   Objective   Blood pressure 114/71, pulse (!) 107, temperature 98.7 F (37.1 C), temperature source Rectal, resp. rate 15, SpO2 99 %.    Vent  Mode: PCV;BIPAP FiO2 (%):  [50 %] 50 % Set Rate:  [8 bmp] 8 bmp PEEP:  [6 cmH20] 6 cmH20   Intake/Output Summary (Last 24 hours) at 03/15/2020 1552 Last data filed at 03/15/2020 1537 Gross per 24 hour  Intake 1000 ml  Output --  Net 1000 ml   There were no vitals filed for this visit.  Examination: General: elderly male lying on ER stretcher in NAD on BiPAP  HEENT: MM pink/dry, BiPAP mask in place, pupils 103m reactive  Neuro: Awake, alert, interactive, MAE / normal strength  CV: s1s2 RRR, distant heart tones, no m/r/g PULM:  Non-labored  on BiPAP, speaks full sentences, clear anterior, diminished L>R bases GI: soft, bsx4 active  Extremities: warm/dry, BLE 1+ pre-tibial edema, increased pedal edema  Skin: no rashes or lesions  Resolved Hospital Problem list      Assessment & Plan:   Acute on Chronic Hypoxic Respiratory Failure Suspect multifactorial in setting of known valvular disease, baseline COPD and ongoing therapy with PD-l1 inhibitor which can be associated with pneumonitis / interstitial lung disease.  2-3L O2 dependent at baseline.  Hx of pericardial, pleural effusion raises concern for inflammatory process from Imfinzi  -BiPAP support for increased work of breathing  -admit to SDU per [facility]  -lasix 20 mg IV x1  -assess PCT, Sed rate, BNP  -empiric azithromycin  -steroids for possible pneumonitis  -assess response to IV diuresis, pending review may need repeat thoracentesis.   Stage IIIb Non-Small Cell Lung Cancer  Treated with carboplatin, paclitaxel.  Now on Imfinzi -supportive care   Severe Aortic Stenosis  Pericardial Effusion  Not an emergent valvular replacement candidate per Cardiology last note -reassess ECHO to ensure effusion not enlarging     Best practice:  Diet: NPO while on BiPAP Pain/Anxiety/Delirium protocol (if indicated): n/a VAP protocol (if indicated): n/a  DVT prophylaxis: per primary  GI prophylaxis: per primary  Glucose control: per primary  Mobility: As tolerated  Code Status: DNR / DNI  Family Communication: Patient and daughter-in-law updated on plan of care 4/19.  Disposition: SDU, per TR[facility]   Labs   CBC: Recent Labs  Lab 03/09/20 1030 03/15/20 1213  WBC 5.8 11.3*  NEUTROABS 4.4 9.8*  HGB 9.5* 11.4*  HCT 29.2* 35.7*  MCV 120.7* 124.4*  PLT 213 3950   Basic Metabolic Panel: Recent Labs  Lab 03/09/20 1030 03/15/20 1213  NA 141 143  K 4.5 4.1  CL 110 108  CO2 23 22  GLUCOSE 123* 247*  BUN 18 26*  CREATININE 1.03 1.18  CALCIUM 8.5* 8.8*    GFR: Estimated Creatinine Clearance: 49.9 mL/min (by C-G formula based on SCr of 1.18 mg/dL). Recent Labs  Lab 03/09/20 1030 03/15/20 1213  WBC 5.8 11.3*  LATICACIDVEN  --  1.8    Liver Function Tests: Recent Labs  Lab 03/09/20 1030  AST 15  ALT 12  ALKPHOS 93  BILITOT 0.7  PROT 6.2*  ALBUMIN 3.6   No results for input(s): LIPASE, AMYLASE in the last 168 hours. No results for input(s): AMMONIA in the last 168 hours.  ABG    Component Value Date/Time   PHART 7.405 03/15/2020 1411   PCO2ART 33.9 03/15/2020 1411   PO2ART 69.7 (L) 03/15/2020 1411   HCO3 20.8 03/15/2020 1411   TCO2 17.0 04/06/2013 0003   ACIDBASEDEF 2.8 (H) 03/15/2020 1411   O2SAT 92.4 03/15/2020 1411     Coagulation Profile: No results for input(s): INR, PROTIME in the last  168 hours.  Cardiac Enzymes: No results for input(s): CKTOTAL, CKMB, CKMBINDEX, TROPONINI in the last 168 hours.  HbA1C: No results found for: HGBA1C  CBG: No results for input(s): GLUCAP in the last 168 hours.  Review of Systems: Positives in [place]   Gen: Denies fever, chills, weight change, fatigue, night sweats HEENT: Denies blurred vision, double vision, hearing loss, tinnitus, sinus congestion, rhinorrhea, sore throat, neck stiffness, dysphagia PULM: Denies shortness of breath, cough, occasional green sputum production, hemoptysis, wheezing CV: Denies chest pain, edema, orthopnea, paroxysmal nocturnal dyspnea, palpitations GI: Denies abdominal pain, nausea, vomiting, diarrhea, hematochezia, melena, constipation, change in bowel habits GU: Denies dysuria, hematuria, polyuria, oliguria, urethral discharge Endocrine: Denies hot or cold intolerance, polyuria, polyphagia or appetite change Derm: Denies rash, dry skin, scaling or peeling skin change Heme: Denies easy bruising, bleeding, bleeding gums Neuro: Denies headache, numbness, weakness, slurred speech, loss of memory or consciousness  Past Medical History  He,  has  a past medical history of Cancer ([place]), Complication of anesthesia, COPD (chronic obstructive pulmonary disease) ([place]), Dyspnea, GERD (gastroesophageal reflux disease), Hemoptysis (11/20/2018), History of hiatal hernia, Hypertension, Hypoxia (11/20/2018), Legionnaire's disease ([place]) (2014), Migraine with visual aura, Non-small cell lung cancer ([place]) (dx'd 11/20/18), Pneumonia (11/20/2018), PONV (postoperative nausea and vomiting), and Pulmonary nodule (11/20/2018).   Surgical History    Past Surgical History:  Procedure Laterality Date  . COLONOSCOPY W/ POLYPECTOMY    . IR IMAGING GUIDED PORT INSERTION  03/14/2019  . TONSILLECTOMY    . VASECTOMY    . VIDEO BRONCHOSCOPY WITH ENDOBRONCHIAL ULTRASOUND N/A 12/06/2018   Procedure: VIDEO BRONCHOSCOPY WITH ENDOBRONCHIAL ULTRASOUND;  Surgeon: Garner Nash, DO;  Location: [place] OR;  Service: Thoracic;  Laterality: N/A;     Social History   reports that he quit smoking about 21 years ago. His smoking use included cigarettes. He quit after 30.00 years of use. He has never used smokeless tobacco. He reports that he does not drink alcohol or use drugs.   Family History   His family history includes CVA in his father; Cancer - Cervical in his sister; Cancer - Lung in his mother; Hypertension in his father.   Allergies No Known Allergies   Home Medications  Prior to Admission medications   Medication Sig Start Date End Date Taking? Authorizing Provider  albuterol (PROVENTIL HFA;VENTOLIN HFA) 108 (90 Base) MCG/ACT inhaler Inhale 2 puffs into the lungs every 6 (six) hours as needed for wheezing or shortness of breath. 12/05/18   Icard, Leory Plowman L, DO  albuterol (PROVENTIL) (2.5 MG/3ML) 0.083% nebulizer solution Take 3 mLs (2.5 mg total) by nebulization every 4 (four) hours. 02/04/20   Garner Nash, DO  BREZTRI AEROSPHERE 160-9-4.8 MCG/ACT AERO  11/10/19   [provider]  Cyanocobalamin (VITAMIN B 12 PO) Take 1 capsule by mouth daily. 2500 mcg     [provider]  dimenhyDRINATE (DRAMAMINE) 50 MG tablet Take 50 mg by mouth every 8 (eight) hours as needed for dizziness.     [provider]  Durvalumab (IMFINZI IV) Inject into the vein every 14 (fourteen) days.    [provider]  Multiple Vitamin (MULTIVITAMIN) tablet Take 1 tablet by mouth daily.    [provider]  pantoprazole (PROTONIX) 40 MG tablet Take 40 mg by mouth 2 (two) times daily.    [provider]  polycarbophil (FIBERCON) 625 MG tablet Take 625 mg by mouth 2 (two) times daily.     [provider]  prochlorperazine (COMPAZINE) 10  MG tablet TAKE ONE TABLET BY MOUTH EVERY 6 HOURS AS NEEDED FOR NAUSEA OR VOMITING 02/11/19   [name], MD  tamsulosin [facility]) 0.4 MG CAPS capsule  10/29/19   [provider]  traMADol (ULTRAM) 50 MG tablet Take 50 mg by mouth 2 (two) times daily as needed.    [provider]     Critical care time: n/a     Noe Gens, MSN, NP-C [facility] 03/15/2020, 3:52 PM   Please see [email or web address] for pager details.

## 2020-03-15 NOTE — ED Provider Notes (Addendum)
[facility] DEPT Provider Note   CSN: [id number] Arrival date & time: 03/15/20  1122     History Chief Complaint  Patient presents with  . Shortness of Breath    Robert Dixon is a 79 y.o. male.  Pt presents to the ED today with sob.  The pt has been more sob for the past week.  The pt is normally on 3L oxygen at home.  Pt lives alone and EMS said his O2 sats were 70% on his usual 3L.  Pt gave himself a duoneb prior to arrival.  EMS gave him 5 mg albuterol and 5 mg atrovent en route.  Pt is so sob, that history is obtained mainly from records.  Pt is followed by [name] (pulmonology)        Past Medical History:  Diagnosis Date  . Cancer ([place])    skin-   . Complication of anesthesia   . COPD (chronic obstructive pulmonary disease) ([place])   . Dyspnea   . GERD (gastroesophageal reflux disease)   . Hemoptysis 11/20/2018  . History of hiatal hernia   . Hypertension   . Hypoxia 11/20/2018  . 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 Migraine with visual aura   . Non-small cell lung cancer ([place]) dx'd 11/20/18  . Pneumonia 11/20/2018  . PONV (postoperative nausea and vomiting)   . Pulmonary nodule 11/20/2018    Patient Active Problem List   Diagnosis Date Noted  . Nonrheumatic aortic valve stenosis 10/14/2019  . Pericardial effusion 10/14/2019  . Urinary hesitancy 09/23/2019  . Pleural effusion 09/16/2019  . Port-A-Cath in place 04/22/2019  . Chronic respiratory failure with hypoxia ([place]) 04/15/2019  . Centrilobular emphysema ([place]) 04/14/2019  . Encounter for antineoplastic immunotherapy 03/03/2019  . Antineoplastic chemotherapy induced anemia 02/03/2019  . Non-small cell carcinoma of left lung, stage 3 ([name]) 12/19/2018  . Goals of care, counseling/discussion 12/19/2018  . Encounter for antineoplastic chemotherapy 12/19/2018  . Primary malignant neoplasm of left upper lobe of lung ([place]) 12/12/2018  . Hilar mass 12/06/2018  . Mediastinal  adenopathy 12/06/2018  . Conjunctivitis unspecified 04/10/2013  . Hypokalemia 04/09/2013  . Acute respiratory failure ([place]) 04/09/2013  . ARF (acute renal failure) ([place]) 04/05/2013  . CAP (community acquired pneumonia) 04/04/2013  . Leukocytosis, unspecified 04/04/2013  . HTN (hypertension) 04/04/2013    Past Surgical History:  Procedure Laterality Date  . COLONOSCOPY W/ POLYPECTOMY    . IR IMAGING GUIDED PORT INSERTION  03/14/2019  . TONSILLECTOMY    . VASECTOMY    . VIDEO BRONCHOSCOPY WITH ENDOBRONCHIAL ULTRASOUND N/A 12/06/2018   Procedure: VIDEO BRONCHOSCOPY WITH ENDOBRONCHIAL ULTRASOUND;  Surgeon: Garner Nash, DO;  Location: [facility] OR;  Service: Thoracic;  Laterality: N/A;       Family History  Problem Relation Age of Onset  . Cancer - Lung Mother   . Hypertension Father   . CVA Father   . Cancer - Cervical Sister     Social History   Tobacco Use  . Smoking status: Former Smoker    Years: 30.00    Types: Cigarettes    Quit date: 2000    Years since quitting: 21.3  . Smokeless tobacco: Never Used  Substance Use Topics  . Alcohol use: No  . Drug use: No    Home Medications Prior to Admission medications   Medication Sig Start Date End Date Taking? Authorizing Provider  albuterol (PROVENTIL HFA;VENTOLIN HFA) 108 (90 Base) MCG/ACT inhaler Inhale 2 puffs into the lungs  every 6 (six) hours as needed for wheezing or shortness of breath. 12/05/18   Icard, Leory Plowman L, DO  albuterol (PROVENTIL) (2.5 MG/3ML) 0.083% nebulizer solution Take 3 mLs (2.5 mg total) by nebulization every 4 (four) hours. 02/04/20   Garner Nash, DO  BREZTRI AEROSPHERE 160-9-4.8 MCG/ACT AERO  11/10/19   [provider]  Cyanocobalamin (VITAMIN B 12 PO) Take 1 capsule by mouth daily. 2500 mcg    [provider]  dimenhyDRINATE (DRAMAMINE) 50 MG tablet Take 50 mg by mouth every 8 (eight) hours as needed for dizziness.     [provider]  Durvalumab (IMFINZI IV) Inject  into the vein every 14 (fourteen) days.    [provider]  Multiple Vitamin (MULTIVITAMIN) tablet Take 1 tablet by mouth daily.    [provider]  pantoprazole (PROTONIX) 40 MG tablet Take 40 mg by mouth 2 (two) times daily.    [provider]  polycarbophil (FIBERCON) 625 MG tablet Take 625 mg by mouth 2 (two) times daily.     [provider]  prochlorperazine (COMPAZINE) 10 MG tablet TAKE ONE TABLET BY MOUTH EVERY 6 HOURS AS NEEDED FOR NAUSEA OR VOMITING 02/11/19   [name], MD  tamsulosin [facility]) 0.4 MG CAPS capsule  10/29/19   [provider]  traMADol (ULTRAM) 50 MG tablet Take 50 mg by mouth 2 (two) times daily as needed.    [provider]    Allergies    Patient has no known allergies.  Review of Systems   Review of Systems  Respiratory: Positive for shortness of breath.   All other systems reviewed and are negative.   Physical Exam Updated Vital Signs BP 115/74   Pulse (!) 112   Temp 98.7 F (37.1 C) (Rectal)   Resp 19   SpO2 99%   Physical Exam Vitals and nursing note reviewed.  Constitutional:      General: He is in acute distress.     Appearance: He is ill-appearing.  HENT:     Head: Normocephalic and atraumatic.     Right Ear: External ear normal.     Left Ear: External ear normal.     Nose: Nose normal.     Mouth/Throat:     Mouth: Mucous membranes are dry.  Eyes:     Extraocular Movements: Extraocular movements intact.     Conjunctiva/sclera: Conjunctivae normal.     Pupils: Pupils are equal, round, and reactive to light.  Cardiovascular:     Rate and Rhythm: Regular rhythm. Tachycardia present.     Pulses: Normal pulses.     Heart sounds: Normal heart sounds.  Pulmonary:     Effort: Tachypnea, accessory muscle usage and respiratory distress present.     Breath sounds: Wheezing present.  Musculoskeletal:        General: Normal range of motion.     Cervical back: Normal range of motion and  neck supple.  Skin:    General: Skin is warm.     Capillary Refill: Capillary refill takes less than 2 seconds.  Neurological:     General: No focal deficit present.     Mental Status: He is alert and oriented to person, place, and time.  Psychiatric:        Mood and Affect: Mood normal.        Behavior: Behavior normal.     ED Results / Procedures / Treatments   Labs (all labs ordered are listed, but only abnormal results are  displayed) Labs Reviewed  BASIC METABOLIC PANEL - Abnormal; Notable for the following components:      Result Value   Glucose, Bld 247 (*)    BUN 26 (*)    Calcium 8.8 (*)    GFR calc non Af Amer 59 (*)    All other components within normal limits  BRAIN NATRIURETIC PEPTIDE - Abnormal; Notable for the following components:   B Natriuretic Peptide 1,080.5 (*)    All other components within normal limits  CBC WITH DIFFERENTIAL/PLATELET - Abnormal; Notable for the following components:   WBC 11.3 (*)    RBC 2.87 (*)    Hemoglobin 11.4 (*)    HCT 35.7 (*)    MCV 124.4 (*)    MCH 39.7 (*)    RDW 15.8 (*)    Neutro Abs 9.8 (*)    Basophils Absolute 0.2 (*)    All other components within normal limits  BLOOD GAS, ARTERIAL - Abnormal; Notable for the following components:   pO2, Arterial 69.7 (*)    Acid-base deficit 2.8 (*)    All other components within normal limits  TROPONIN I (HIGH SENSITIVITY) - Abnormal; Notable for the following components:   Troponin I (High Sensitivity) 85 (*)    All other components within normal limits  RESPIRATORY PANEL BY RT PCR (FLU A&B, COVID)  CULTURE, BLOOD (ROUTINE X 2)  CULTURE, BLOOD (ROUTINE X 2)  LACTIC ACID, PLASMA  LACTIC ACID, PLASMA  POC SARS CORONAVIRUS 2 AG -  ED  TROPONIN I (HIGH SENSITIVITY)    EKG EKG Interpretation  Date/Time:  Monday March 15 2020 12:21:08 EDT Ventricular Rate:  129 PR Interval:    QRS Duration: 100 QT Interval:  331 QTC Calculation: 485 R Axis:   -20 Text  Interpretation: Junctional tachycardia Borderline left axis deviation Anteroseptal infarct, age indeterminate Lateral leads are also involved 12 Lead; [place] Since last tracing rate faster Confirmed by Isla Pence [id number]) on 03/15/2020 12:34:57 PM   Radiology DG Chest Port 1 View  Result Date: 03/15/2020 CLINICAL DATA:  Increasing shortness of breath for the past week. History of lung cancer and COPD. EXAM: PORTABLE CHEST 1 VIEW COMPARISON:  CT chest dated December 01, 2019. Chest x-ray dated October 08, 2019. FINDINGS: Unchanged right chest wall port catheter. The cardiac silhouette is increasingly obscured. Normal mediastinal contours. New diffuse interstitial thickening with increasing now moderate left and new small right pleural effusions. Left greater than right basilar opacities, favoring atelectasis. No pneumothorax. No acute osseous abnormality. IMPRESSION: New interstitial pulmonary edema with increased moderate left and new small right pleural effusions. Electronically Signed   By: Titus Dubin M.D.   On: 03/15/2020 12:51    Procedures Procedures (including critical care time)  Medications Ordered in ED Medications  methylPREDNISolone sodium succinate (SOLU-MEDROL) 125 mg/2 mL injection 125 mg (125 mg Intravenous Given 03/15/20 1207)  sodium chloride 0.9 % bolus 1,000 mL (1,000 mLs Intravenous New Bag/Given 03/15/20 1229)    ED Course  I have reviewed the triage vital signs and the nursing notes.  Pertinent labs & imaging results that were available during my care of the patient were reviewed by me and considered in my medical decision making (see chart for details).    MDM Rules/Calculators/A&P                      Pt placed on bipap upon arrival.  Breathing has improved.  RT tried to take him off, but he  failed this.  BP in the 90s, so he was given IVFs.  Pt also has some pulmonary edema and bilateral pleural effusions (L >R).  BP is too low for lasix.  SARS Ag and  PCR neg.  Pt d/w CCM.  They will see pt and decide if they will admit or triad needs to admit.  They did see pt and feel that the hospitalists can admit.  Pt d/w Dr. Renaee Munda (triad) for admission.  CRITICAL CARE Performed by: Isla Pence   Total critical care time: 30 minutes  Critical care time was exclusive of separately billable procedures and treating other patients.  Critical care was necessary to treat or prevent imminent or life-threatening deterioration.  Critical care was time spent personally by me on the following activities: development of treatment plan with patient and/or surrogate as well as nursing, discussions with consultants, evaluation of patient's response to treatment, examination of patient, obtaining history from patient or surrogate, ordering and performing treatments and interventions, ordering and review of laboratory studies, ordering and review of radiographic studies, pulse oximetry and re-evaluation of patient's condition.  Robert Dixon was evaluated in Emergency Department on 03/15/2020 for the symptoms described in the history of present illness. He was evaluated in the context of the global COVID-19 pandemic, which necessitated consideration that the patient might be at risk for infection with the SARS-CoV-2 virus that causes COVID-19. Institutional protocols and algorithms that pertain to the evaluation of patients at risk for COVID-19 are in a state of rapid change based on information released by regulatory bodies including the CDC and federal and state organizations. These policies and algorithms were followed during the patient's care in the ED. Final Clinical Impression(s) / ED Diagnoses Final diagnoses:  Acute respiratory failure with hypoxia (HCC)  Acute pulmonary edema (HCC)  Pleural effusion on left  Non-small cell carcinoma of left lung [facility])    Rx / DC Orders ED Discharge Orders    None       Isla Pence, MD 03/15/20 1515     Isla Pence, MD 03/15/20 [phone]

## 2020-03-15 NOTE — Progress Notes (Signed)
Pt off BIPAP at this time and on 6 LPM [place] and tolerating well. RT to monitor and assess as needed.

## 2020-03-15 NOTE — Telephone Encounter (Signed)
Spoke with patient's son. He wanted to notify Dr. Percival Spanish that patient's breathing worsened some last week. Patient was having a decrease in the amount of distance he could go without being short of breath. This morning patient called sons unable to leave room and needing O2 increased. Sons arrived to find patient with blue lips and an O2 sat of 77%. Patient was sent to ER for evaluation. Will route to Dr. Percival Spanish so that he is aware.

## 2020-03-16 ENCOUNTER — Inpatient Hospital Stay (HOSPITAL_COMMUNITY): Payer: Medicare HMO

## 2020-03-16 ENCOUNTER — Other Ambulatory Visit (HOSPITAL_COMMUNITY): Payer: Medicare HMO

## 2020-03-16 DIAGNOSIS — J9621 Acute and chronic respiratory failure with hypoxia: Secondary | ICD-10-CM

## 2020-03-16 DIAGNOSIS — I340 Nonrheumatic mitral (valve) insufficiency: Secondary | ICD-10-CM

## 2020-03-16 DIAGNOSIS — I313 Pericardial effusion (noninflammatory): Secondary | ICD-10-CM

## 2020-03-16 DIAGNOSIS — J90 Pleural effusion, not elsewhere classified: Secondary | ICD-10-CM

## 2020-03-16 DIAGNOSIS — I351 Nonrheumatic aortic (valve) insufficiency: Secondary | ICD-10-CM | POA: Diagnosis not present

## 2020-03-16 DIAGNOSIS — R7989 Other specified abnormal findings of blood chemistry: Secondary | ICD-10-CM

## 2020-03-16 HISTORY — DX: Acute and chronic respiratory failure with hypoxia: J96.21

## 2020-03-16 LAB — BASIC METABOLIC PANEL
Anion gap: 11 (ref 5–15)
BUN: 32 mg/dL — ABNORMAL HIGH (ref 8–23)
CO2: 21 mmol/L — ABNORMAL LOW (ref 22–32)
Calcium: 8.9 mg/dL (ref 8.9–10.3)
Chloride: 112 mmol/L — ABNORMAL HIGH (ref 98–111)
Creatinine, Ser: 1.24 mg/dL (ref 0.61–1.24)
GFR calc Af Amer: >60 mL/min (ref >60)
GFR calc non Af Amer: 55 mL/min — ABNORMAL LOW (ref >60)
Glucose, Bld: 152 mg/dL — ABNORMAL HIGH (ref 70–99)
Potassium: 4.5 mmol/L (ref 3.5–5.1)
Sodium: 144 mmol/L (ref 135–145)

## 2020-03-16 LAB — PROTEIN, PLEURAL OR PERITONEAL FLUID: Total protein, fluid: <3 g/dL

## 2020-03-16 LAB — CBC
HCT: 31.8 % — ABNORMAL LOW (ref 39.0–52.0)
Hemoglobin: 10.2 g/dL — ABNORMAL LOW (ref 13.0–17.0)
MCH: 40.5 pg — ABNORMAL HIGH (ref 26.0–34.0)
MCHC: 32.1 g/dL (ref 30.0–36.0)
MCV: 126.2 fL — ABNORMAL HIGH (ref 80.0–100.0)
Platelets: 247 10*3/uL (ref 150–400)
RBC: 2.52 MIL/uL — ABNORMAL LOW (ref 4.22–5.81)
RDW: 15.6 % — ABNORMAL HIGH (ref 11.5–15.5)
WBC: 5 10*3/uL (ref 4.0–10.5)
nRBC: 0 % (ref 0.0–0.2)

## 2020-03-16 LAB — GRAM STAIN

## 2020-03-16 LAB — ALBUMIN, PLEURAL OR PERITONEAL FLUID: Albumin, Fluid: 1.9 g/dL

## 2020-03-16 LAB — BODY FLUID CELL COUNT WITH DIFFERENTIAL
Lymphs, Fluid: 89 %
Monocyte-Macrophage-Serous Fluid: 8 % — ABNORMAL LOW (ref 50–90)
Neutrophil Count, Fluid: 3 % (ref 0–25)
Total Nucleated Cell Count, Fluid: 309 cu mm (ref 0–1000)

## 2020-03-16 LAB — GLUCOSE, PLEURAL OR PERITONEAL FLUID: Glucose, Fluid: 151 mg/dL

## 2020-03-16 LAB — BRAIN NATRIURETIC PEPTIDE: B Natriuretic Peptide: 910.9 pg/mL — ABNORMAL HIGH (ref 0.0–100.0)

## 2020-03-16 LAB — ECHOCARDIOGRAM COMPLETE

## 2020-03-16 LAB — LACTATE DEHYDROGENASE, PLEURAL OR PERITONEAL FLUID: LD, Fluid: 89 U/L — ABNORMAL HIGH (ref 3–23)

## 2020-03-16 LAB — PROCALCITONIN: Procalcitonin: <0.1 ng/mL

## 2020-03-16 LAB — HEPARIN LEVEL (UNFRACTIONATED): Heparin Unfractionated: 0.28 IU/mL — ABNORMAL LOW (ref 0.30–0.70)

## 2020-03-16 MED ORDER — POLYETHYLENE GLYCOL 3350 17 G PO PACK: 17.0000 g | PACK | Freq: Every day | ORAL | Status: DC | PRN

## 2020-03-16 MED ORDER — HEPARIN SODIUM (PORCINE) 5000 UNIT/ML IJ SOLN
5000.0000 [IU] | Freq: Three times a day (TID) | INTRAMUSCULAR | Status: DC
  Administered 2020-03-16 – 2020-03-28 (×31): 5000 [IU] via SUBCUTANEOUS
  Filled 2020-03-16 (×32): qty 1

## 2020-03-16 MED ORDER — DOCUSATE SODIUM 100 MG PO CAPS
100.0000 mg | ORAL_CAPSULE | Freq: Two times a day (BID) | ORAL | Status: DC
  Administered 2020-03-16 – 2020-03-28 (×20): 100 mg via ORAL
  Filled 2020-03-16 (×20): qty 1

## 2020-03-16 MED ORDER — GUAIFENESIN-DM 100-10 MG/5ML PO SYRP
5.0000 mL | ORAL_SOLUTION | ORAL | Status: DC | PRN
  Administered 2020-03-16 (×2): 5 mL via ORAL
  Filled 2020-03-16 (×2): qty 10

## 2020-03-16 MED ORDER — FUROSEMIDE 10 MG/ML IJ SOLN
80.0000 mg | Freq: Once | INTRAMUSCULAR | Status: AC
  Administered 2020-03-16: 80 mg via INTRAVENOUS
  Filled 2020-03-16: qty 8

## 2020-03-16 MED ORDER — LIDOCAINE HCL 1 % IJ SOLN
INTRAMUSCULAR | Status: AC
  Filled 2020-03-16: qty 20

## 2020-03-16 NOTE — Progress Notes (Signed)
  Echocardiogram 2D Echocardiogram has been performed.  Matilde Bash 03/16/2020, 12:14 PM

## 2020-03-16 NOTE — Consult Note (Addendum)
Cardiology Consultation:   Patient ID: Robert Dixon; [id number]; 09-27-1941   Admit date: 03/15/2020 Date of Consult: 03/16/2020  Primary Care Provider: [name], MD Primary Cardiologist: Robert Breeding, MD Primary Electrophysiologist:  None   Patient Profile:   Robert Dixon is a 79 y.o. 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  History of Present Illness:   Robert Dixon was in his usual state of health until a few days ago when he began experiencing DOE. On the morning of 03/15/20 when he was noted to have some confusion and hypoxia with O2 sats in the 70s at home, as well as significant SOB walking from the bedroom to the bathroom, prompting EMS activation. Normally his O2 sats run between 92-94% on 2-3L. He was noticed to have increased work of breathing on arrival to the ED and placed on BiPAP. He received steroids, antibiotics, and IV lasix with improvement in breathing.     He was last evaluated by cardiology at an outpatient visit with Robert Dixon 02/05/20, at which time he had chronic DOE, though no other significant cardiac symptoms. He was scheduled for a repeat echo 05/2020 to further evaluate his pericardial effusion and aortic stenosis. Per discussions with the patient/son and Robert Dixon, patient would prefer conservative management of his AS pending completion of his immunotherapy, at which time he would reconsider invasive testing depending on how his surveillance scans looked. He was instructed to notify the office for sudden worsening of his SOB.   His last echocardiogram 11/2019 showed EF 60-65%, mild LVH, moderate pericardial effusion (similar to previous 08/2019), moderate MAC, and severe aortic stenosis with mean gradient 37.8. Does not appear to have had a prior  ischemic evaluation, though noted to have aortic atherosclerosis and extensive coronary artery calcifications on high resolution CT chest this admission.   He notes improvement in his breathing at the time of this exam, though still SOB. He reports occasional swelling his his ankles L>R. He has chronic orthopnea. He has occasional dizziness with sudden position changes. No recent palpitations or racing heart beat sensations.  He denies any chest pain prior to admission. No recent lightheadedness or syncope.  Hospital course: afebrile, tachycardic 100s-130s, intermittently tachypneic, BP stable, satting in the 90s on BiPAP weaned to O2 via [place] 7L this morning. Labs notable for electrolytes wnl, Cr 1.18, glucose in the 200s, WBC 11.3, Hgb 11.4, PLT 354, BNP 1080>910, HsTrop 85>127>134, COVID-19 negative, RVP negative, ESR elevated. EKG with junctional tachycardia vs sinus tachycardia with 1st degree AV block, rate 129 bpm, TWI in anterolateral leads, no STE/D. CXR with new interstitial pulmonary edema with increased moderate left and new small right pleural effusions. High resolution CT Chest showed bilateral left>right moderate pleural effusion with associated atelectasis or consolidation, unchanged post-tx consolidation and fibrosis of the left lung, and a small pericardial effusion. He was admitted to medicine with PCCM following. He was started on IV steroids, antibiotics, pulmonary toileting regimen, and IV lasix for multifactorial acute on chronic respiratory failure. Cardiology asked to evaluate for possible CHF/AS component.   Past Medical History:  Diagnosis Date  . Cancer ([place])    skin-   . Complication of anesthesia   . COPD (chronic obstructive pulmonary disease) ([place])   . Dyspnea   .  GERD (gastroesophageal reflux disease)   . Hemoptysis 11/20/2018  . History of hiatal hernia   . Hypertension   . Hypoxia 11/20/2018  . 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 Migraine with visual aura   .  Non-small cell lung cancer ([place]) dx'd 11/20/18  . Pneumonia 11/20/2018  . PONV (postoperative nausea and vomiting)   . Pulmonary nodule 11/20/2018    Past Surgical History:  Procedure Laterality Date  . COLONOSCOPY W/ POLYPECTOMY    . IR IMAGING GUIDED PORT INSERTION  03/14/2019  . TONSILLECTOMY    . VASECTOMY    . VIDEO BRONCHOSCOPY WITH ENDOBRONCHIAL ULTRASOUND N/A 12/06/2018   Procedure: VIDEO BRONCHOSCOPY WITH ENDOBRONCHIAL ULTRASOUND;  Surgeon: Garner Nash, DO;  Location: [facility] OR;  Service: Thoracic;  Laterality: N/A;     Home Medications:  Prior to Admission medications   Medication Sig Start Date End Date Taking? Authorizing Provider  albuterol (PROVENTIL HFA;VENTOLIN HFA) 108 (90 Base) MCG/ACT inhaler Inhale 2 puffs into the lungs every 6 (six) hours as needed for wheezing or shortness of breath. 12/05/18  Yes Icard, Bradley L, DO  albuterol (PROVENTIL) (2.5 MG/3ML) 0.083% nebulizer solution Take 3 mLs (2.5 mg total) by nebulization every 4 (four) hours. 02/04/20  Yes Icard, Bradley L, DO  ascorbic acid (VITAMIN C) 500 MG tablet Take 500 mg by mouth daily.   Yes [provider]  Cyanocobalamin (VITAMIN B 12 PO) Take 1 capsule by mouth daily. 2500 mcg   Yes [provider]  dimenhyDRINATE (DRAMAMINE) 50 MG tablet Take 50 mg by mouth every 8 (eight) hours as needed for dizziness.    Yes [provider]  docusate sodium (COLACE) 100 MG capsule Take 100 mg by mouth daily as needed for mild constipation or moderate constipation.   Yes [provider]  Multiple Vitamin (MULTIVITAMIN) tablet Take 1 tablet by mouth daily.   Yes [provider]  pantoprazole (PROTONIX) 40 MG tablet Take 40 mg by mouth 2 (two) times daily.   Yes [provider]  polycarbophil (FIBERCON) 625 MG tablet Take 625 mg by mouth 2 (two) times daily.    Yes [provider]  prochlorperazine (COMPAZINE) 10 MG tablet TAKE ONE TABLET BY MOUTH EVERY 6 HOURS AS  NEEDED FOR NAUSEA OR VOMITING 02/11/19  Yes [name], MD  tamsulosin (FLOMAX) 0.4 MG CAPS capsule Take 0.4 mg by mouth daily.  10/29/19  Yes [provider]  traMADol (ULTRAM) 50 MG tablet Take 50 mg by mouth 2 (two) times daily as needed.   Yes [provider]  TRELEGY ELLIPTA 100-62.5-25 MCG/INH AEPB Inhale 1 puff into the lungs daily. 02/17/20  Yes [provider]  Durvalumab (IMFINZI IV) Inject into the vein every 14 (fourteen) days.    [provider]    Inpatient Medications: Scheduled Meds: . arformoterol  15 mcg Nebulization BID  . budesonide (PULMICORT) nebulizer solution  0.5 mg Nebulization BID  . chlorhexidine  15 mL Mouth Rinse BID  . Chlorhexidine Gluconate Cloth  6 each Topical Daily  . ipratropium  0.5 mg Nebulization Q6H  . mouth rinse  15 mL Mouth Rinse q12n4p  . methylPREDNISolone (SOLU-MEDROL) injection  40 mg Intravenous Q6H  . pantoprazole  40 mg Oral BID  . polycarbophil  625 mg Oral BID  . tamsulosin  0.4 mg Oral Daily   Continuous Infusions: . azithromycin Stopped (03/15/20 2118)  . heparin 1,000 Units/hr (03/16/20 0500)   PRN Meds: acetaminophen **OR** acetaminophen, albuterol, dimenhyDRINATE, lip balm,  ondansetron **OR** ondansetron (ZOFRAN) IV, traMADol  Allergies:   No Known Allergies  Social History:   Social History   Socioeconomic History  . Marital status: Widowed    Spouse name: Not on file  . Number of children: Not on file  . Years of education: Not on file  . Highest education level: Not on file  Occupational History  . Not on file  Tobacco Use  . Smoking status: Former Smoker    Years: 30.00    Types: Cigarettes    Quit date: 2000    Years since quitting: 21.3  . Smokeless tobacco: Never Used  Substance and Sexual Activity  . Alcohol use: No  . Drug use: No  . Sexual activity: Not on file  Other Topics Concern  . Not on file  Social History Narrative   Lives alone.  Two sons.  Widower.      Social Determinants of Health   Financial Resource Strain:   . Difficulty of Paying Living Expenses:   Food Insecurity:   . Worried About Charity fundraiser in the Last Year:   . Arboriculturist in the Last Year:   Transportation Needs:   . Film/video editor (Medical):   Marland Kitchen Lack of Transportation (Non-Medical):   Physical Activity:   . Days of Exercise per Week:   . Minutes of Exercise per Session:   Stress:   . Feeling of Stress :   Social Connections:   . Frequency of Communication with Friends and Family:   . Frequency of Social Gatherings with Friends and Family:   . Attends Religious Services:   . Active Member of Clubs or Organizations:   . Attends Archivist Meetings:   Marland Kitchen Marital Status:   Intimate Partner Violence:   . Fear of Current or Ex-Partner:   . Emotionally Abused:   Marland Kitchen Physically Abused:   . Sexually Abused:     Family History:    Family History  Problem Relation Age of Onset  . Cancer - Lung Mother   . Hypertension Father   . CVA Father   . Cancer - Cervical Sister      ROS:  Please see the history of present illness.  ROS  All other ROS reviewed and negative.     Physical Exam/Data:   Vitals:   03/16/20 0400 03/16/20 0500 03/16/20 0600 03/16/20 0700  BP: 113/65 105/70 (!) 114/59 (!) 88/53  Pulse: (!) 103 100 (!) 105 (!) 101  Resp: 19 17 (!) 21 (!) 23  Temp: (!) 97.5 F (36.4 C)     TempSrc: Axillary     SpO2: 99% 100% 100% 93%    Intake/Output Summary (Last 24 hours) at 03/16/2020 0748 Last data filed at 03/16/2020 0500 Gross per 24 hour  Intake 1371.91 ml  Output 400 ml  Net 971.91 ml   There were no vitals filed for this visit. There is no height or weight on file to calculate BMI.  General:  Sitting upright in bed with mildly increased work of breathing HEENT: sclera anicteric  Neck: no JVD Vascular: No carotid bruits; distal pulses 2+ bilaterally Cardiac:  normal S1, S2; tachycardic, regular rhythm, quiet  heart sounds; +murmur  Lungs:  clear to auscultation bilaterally, no wheezing, rhonchi or rales  Abd: NABS, soft, nontender, no hepatomegaly Ext: trace LE edema Musculoskeletal:  No deformities, BUE and BLE strength normal and equal Skin: warm and dry  Neuro:  CNs 2-12 intact, no focal  abnormalities noted Psych:  Normal affect   EKG:  The EKG was personally reviewed and demonstrates:  junctional tachycardia vs sinus tachycardia with 1st degree AV block, rate 129 bpm, TWI in anterolateral leads, no STE/D. Telemetry:  Telemetry was personally reviewed and demonstrates:  Sinus tachycardia with rates 90s-100s  Relevant CV Studies: Echocardiogram 11/2019: 1. Left ventricular ejection fraction, by visual estimation, is 60 to  65%. The left ventricle has normal function. There is mildly increased  left ventricular hypertrophy.  2. Moderate pericardial effusion, measuring up to 1cm adjacent to RV free  wall. Similar to prior echo on 09/24/19, RA inversion is seen, but  otherwise no evidence of tamponade, with no RV diastolic collapse seen and  IVC is small/collapsible  3. Global right ventricle has normal systolic function.The right  ventricular size is normal. No increase in right ventricular wall  thickness.  4. Left atrial size was normal.  5. Right atrial size was normal.  6. Moderate mitral annular calcification.  7. The mitral valve is abnormal. Trivial mitral valve regurgitation.  8. The tricuspid valve is normal in structure.  9. The aortic valve is abnormal. Severely calcified. Aortic valve  regurgitation is moderate. Moderate to severe aortic valve stenosis  10. The pulmonic valve was not well visualized. Pulmonic valve  regurgitation is not visualized.  11. The inferior vena cava is normal in size with greater than 50%  respiratory variability, suggesting right atrial pressure of 3 mmHg.  12. The tricuspid regurgitant velocity is 2.90 m/s, and with an assumed  right  atrial pressure of 3 mmHg, the estimated right ventricular systolic  pressure is mildly elevated at 36.6 mmHg.   Laboratory Data:  Chemistry Recent Labs  Lab 03/09/20 1030 03/15/20 1213 03/16/20 0236  NA 141 143 144  K 4.5 4.1 4.5  CL 110 108 112*  CO2 23 22 21*  GLUCOSE 123* 247* 152*  BUN 18 26* 32*  CREATININE 1.03 1.18 1.24  CALCIUM 8.5* 8.8* 8.9  GFRNONAA >60 59* 55*  GFRAA >60 >60 >60  ANIONGAP _0 Recent Labs  Lab 03/09/20 1030  PROT 6.2*  ALBUMIN 3.6  AST 15  ALT 12  ALKPHOS 93  BILITOT 0.7   Hematology Recent Labs  Lab 03/09/20 1030 03/15/20 1213 03/16/20 0236  WBC 5.8 11.3* 5.0  RBC 2.42* 2.87* 2.52*  HGB 9.5* 11.4* 10.2*  HCT 29.2* 35.7* 31.8*  MCV 120.7* 124.4* 126.2*  MCH 39.3* 39.7* 40.5*  MCHC 32.5 31.9 32.1  RDW 14.8 15.8* 15.6*  PLT 213 354 247   Cardiac EnzymesNo results for input(s): TROPONINI in the last 168 hours. No results for input(s): TROPIPOC in the last 168 hours.  BNP Recent Labs  Lab 03/15/20 1213 03/16/20 0236  BNP 1,080.5* 910.9*    DDimer No results for input(s): DDIMER in the last 168 hours.  Radiology/Studies:  CT Chest High Resolution  Result Date: 03/15/2020 CLINICAL DATA:  Interstitial lung disease, shortness of breath EXAM: CT CHEST WITHOUT CONTRAST TECHNIQUE: Multidetector CT imaging of the chest was performed following the standard protocol without intravenous contrast. High resolution imaging of the lungs, as well as inspiratory and expiratory imaging, was performed. COMPARISON:  12/01/2019 FINDINGS: Cardiovascular: Aortic atherosclerosis. Dense aortic valve calcifications. Normal heart size. Extensive coronary artery calcifications and/or stents. Small pericardial effusion, unchanged compared to prior examination. Mediastinum/Nodes: No enlarged mediastinal, hilar, or axillary lymph nodes. Thyroid gland, trachea, and esophagus demonstrate no significant findings. Lungs/Pleura: There are bilateral, left  greater than right moderate pleural effusions and associated atelectasis or consolidation, significantly increased compared to prior examination. Severe centrilobular emphysema. Post treatment consolidation and fibrosis of the perihilar and infrahilar left lung, generally similar to prior examination although significantly obscured by enlarged left pleural effusion. Upper Abdomen: No acute abnormality. Musculoskeletal: No chest wall mass or suspicious bone lesions identified. Unchanged wedge deformity of the T7 vertebral body. IMPRESSION: 1. There are bilateral, left greater than right moderate pleural effusions and associated atelectasis or consolidation, significantly increased compared to prior examination. 2. Post treatment consolidation and fibrosis of the perihilar and infrahilar left lung, generally similar to prior examination although significantly obscured by enlarged left pleural effusion. 3.  Severe emphysema.  Emphysema (ICD10-J43.9). 4. No evidence of fibrotic interstitial lung disease; the dependent bilateral lungs are not adequately assessed given the presence of pleural effusions. 5.  Coronary artery disease. 6. Dense aortic valve calcifications. Correlate with echocardiographic findings of valve function. 7.  Aortic Atherosclerosis (ICD10-I70.0). 8. Small pericardial effusion, unchanged compared to prior examination. Electronically Signed   By: Eddie Candle M.D.   On: 03/15/2020 18:56   DG Chest Port 1 View  Result Date: 03/15/2020 CLINICAL DATA:  Increasing shortness of breath for the past week. History of lung cancer and COPD. EXAM: PORTABLE CHEST 1 VIEW COMPARISON:  CT chest dated December 01, 2019. Chest x-ray dated October 08, 2019. FINDINGS: Unchanged right chest wall port catheter. The cardiac silhouette is increasingly obscured. Normal mediastinal contours. New diffuse interstitial thickening with increasing now moderate left and new small right pleural effusions. Left greater than right  basilar opacities, favoring atelectasis. No pneumothorax. No acute osseous abnormality. IMPRESSION: New interstitial pulmonary edema with increased moderate left and new small right pleural effusions. Electronically Signed   By: Titus Dubin M.D.   On: 03/15/2020 12:51    Assessment and Plan:   1. Acute on chronic hypoxic respiratory failure: likely contributed to by COPD exacerbation, underlying lung cancer with possible inflammatory component with ongoing immunotherapy, severe aortic stenosis, and suspected CHF. BNP elevated to 1080 and trending down with IV lasix. He has bilateral moderate (L>R) pleural effusions. He has received 36m IV lasix so far, though if I&Os are accurate, remains almost 1L up this admission. No weight trend. Cr is generally stable 1.18>1.24 today; baseline appears to be 1.0. He has required thoracenteses in the past for management of his pleural effusions which were without malignant cells.  - Await repeat echo - Favor ongoing diuresis with more aggressive IV lasix. Will give 864mIV lasix now - Continue steroids, antibiotics, and supportive care per primary team and PCCM - Anticipate repeat CXR today with consideration for thoracentesis pending improvement in pleural effusions with diuresis - Continue to monitor strict I&Os and daily weights - Continue to monitor electrolytes and replete as needed to maintain K >4, Mg >2  2. Elevated troponins: HsTrop trend: 85>127>134. Relatively low flat trend is not c/w ACS. Appears to have new TWI in I and V2; TWI in aVL seen on 11/2019 EKG. Possible this is rate related. No STE/D. He has evidence of extensive coronary artery calcifications on high resolution CT chest this admission. No prior ischemic evaluation. Suspect elevation is 2/2 demand ischemia in the setting of #1.  - Await echo results - Favor stopping heparin at this time  - Likely not a good candidate for invasive ischemic evaluation at this time.   3. Severe aortic  stenosis: mean gradient 37.8 on last echo 11/2019. He had  no significant SOB complaints, chest pain, pre-syncope, or syncope at his last visit with Robert Dixon 01/2020, at which time the discussed options for further work up vs watchful waiting. Patient wished to continue conservative management until after completion of his immunotherapy and surveillance scans to determine if he wished to pursue a heart catheterization to further evaluate his aortic valve.  - Will follow-up repeat echo.   4. HTN: not on medications outpatient. BP stable this admission - Continue to monitor   For questions or updates, please contact [facility] Please consult [email or web address] for contact info under Cardiology/STEMI.   Signed, Abigail Butts, PA-C  03/16/2020 7:48 AM [phone]  Pt seen and examined   I agree with findings as noted by K Kroeger above  Briefly, pt is a 79 yo with hypertension, stage IIIb non-small cell CA of the lung, COPD, severe aortic stenosis.  The patient was in his usual state of health until the morning of April 19 found to be confused hypoxic significantly short of breath.  EMS called and he was brought to [facility].  There he was started on steroids antibiotics and IV Lasix.  Cardiology contacted for CHF.  Aching to the patient he says he is breathing a little bit better than admit but still short of breath.  He denies chest pain  On exam neck is full lungs are showed decreased breath sounds on the left.  Rales on the right bases cardiac exam regular rate and rhythm E8-F3 grade 3/6 systolic murmur best at the base.  Abdomen supple nontender.  Extremities with Trace edema.  Chest x-ray shows large left pleural effusion.  CT of the chest did show coronary calcifications, small pericardial effusion, pleural effusion. Labs significant for very mild elevation of troponin (127, 134) BNP elevated at 911.  I would recommend continuing IV diuresis now.  I would also schedule patient for  an echo to reevaluate LVEF and also aortic stenosis.  Note plans for thoracentesis later today.  The patient is currently on heparin.  He does have coronary calcifications on CT scan, but I am not convinced of active ischemia.  The troponin elevation may reflect strain from heart failure.  Could stop and placed on subcutaneous heparin.  We will continue to follow with you.  Dorris Carnes MD

## 2020-03-16 NOTE — Progress Notes (Signed)
BiPAP not needed at this time. Patient resting with stable VS. RT will continue to follow.

## 2020-03-16 NOTE — Consult Note (Addendum)
NAME:  Robert Dixon, MRN:  [id number], DOB:  12-Nov-1941, LOS: 1 ADMISSION DATE:  03/15/2020, CONSULTATION DATE:  4/19 REFERRING MD:  Dr. Gilford Raid, ER MD, CHIEF COMPLAINT:  Dyspnea   Brief History    79 y/o M who presented to [facility] ER on 4/19 with reports of increasing dyspnea and lower extremity swelling.    The patient has known Stage IIIb (T1b, N3, M0) non-small cell lung cancer favoring adenocarcinoma that was diagnosed in January 2020  (Dr June Leap).  He completed concurrent chemoradiation with carboplatin, paclitaxel for 5 cycles.  He is now on immunotherapy with Imfinzi (first dose March 25, 2019). Additionally, he has known severe aortic stenosis that is followed closely by Dr. Percival Spanish and felt not to be urgent in nature for valve replacement.  The patient also has a moderate pericardial effusion.  In November of 2020, he underwent a thoracentesis with 500 ml of clear pleural fluid removed. Fluid analysis was negative for malignant cells, total nucleated cells 1,555 with reactive properties.  Office notes from [name] on 02/18/20 express concern for possible depression.   His daughter-in-law provides majority of information at admit/consult. She indicates he has COPD, CHF and aortic stenosis.  Recently finished his immunotherapy.  She called to check on him this am and noted he was very emotional stating he didn't know how his family was "going to care for him".  Her husband went to check in on him and found him to have saturations in the 70's.  They administered an albuterol treatment and called 911.  She notes his saturations have been doing well running 92-94% on 2-3L.  Noting at times he will panic with dyspnea on exertion.  When she spoke with him on the phone this am, she thought he might be slightly confused.    On arrival to the ER, he was placed on BiPAP for increased work of breathing with significant improvement. Initial ABG 7.405 / 33.9 / 69.7 / 20.8.  Initial CXR notable for  interstitial edema, moderate left effusion.  Labs - Na 143, K 4.1, glucose 247, BUN 26, sr cr 1.18, BNP 1,080, troponin 85, lactic acid 1.8 > 1.5, WBC 11.3, Hgb 11.4 and platelets 354.  He reports occasional green sputum but it has not changed in color or volume.  Denies fevers / chills, n/v/d.  Indicates he is vaccinated for COVID and lives alone.  Reports increased LE edema in the last two weeks.   PCCM called for evaluation.    Past Medical History  Pulmonary Nodule Stage IIIb Non-small cell Lung Cancer / favors adenocarcinoma, on Imfinzi (first dose 03/25/19) Chronic hypoxic respiratory failure  COPD Severe Aortic Stenosis  Pericardial Effusion  GERD Hiatal Hernia  HTN  Migraines   Significant Hospital Events   4/19 Admit with SOB.,Pt reports feeling much better with use of bipap.   Consults:  PCCM   Procedures:    Significant Diagnostic Tests:   CXR 4/19 >> new interstitial pulmonary edema, moderate left effusion  Sed Rate 4/19 >>   PCT 4/19 >>   Micro Data:  COVID 4/19 >> negative  Influenza A/B 4/19 >> negative   Antimicrobials:  Azithromycin 4/19 >>   Interim history/subjective:   4/20 - > on 4L [place] and stable. CT chest  - with L > R effusion.Personal serial review of imaging x 1  Year shows this is worse. There is chronicity to process. Patient remembered Dr Chase Caller from 2017 -> took care of his wife  Geannie Risen who died from resp failure  Objective   Blood pressure (!) 103/57, pulse (!) 112, temperature (!) 97.5 F (36.4 C), temperature source Axillary, resp. rate (!) 24, SpO2 91 %.    Vent Mode: BIPAP FiO2 (%):  [50 %] 50 % Set Rate:  [8 bmp-10 bmp] 10 bmp PEEP:  [6 cmH20] 6 cmH20   Intake/Output Summary (Last 24 hours) at 03/16/2020 1050 Last data filed at 03/16/2020 1000 Gross per 24 hour  Intake 1661.88 ml  Output 1000 ml  Net 661.88 ml   There were no vitals filed for this visit.  Examination: General Appearance:  Overweight Head:   Normocephalic, without obvious abnormality, atraumatic Eyes:  PERRL - equal, conjunctiva/corneas - muddy     Ears:  Normal external ear canals, both ears Nose:  G tube - no but has [place] Throat:  ETT TUBE - no , OG tube - no Neck:  Supple,  No enlargement/tenderness/nodules Lungs: Clear to auscultation bilaterally. [place] chested. Overall diminished Heart:  S1 and S2 normal, no murmur, CVP - no.  Pressors - n Abdomen:  Soft, no masses, no organomegaly Genitalia / Rectal:  Not done Extremities:  Extremities- intact Skin:  ntact in exposed areas . Sacral area - not examined Neurologic:  Sedation - none -> RASS - +1 . Moves all 4s - yes. CAM-ICU - neg . Orientation - x3+      Resolved Hospital Problem list      Assessment & Plan:   Acute on Chronic Hypoxic Respiratory Failure Suspect multifactorial in setting of known valvular disease, baseline COPD and ongoing therapy with PD-l1 inhibitor which can be associated with pneumonitis / interstitial lung disease.  2-3L O2 dependent at baseline.  Hx of pericardial, pleural effusion raises concern for inflammatory process from [place]    03/16/2020 - on 4L[place]. L > R effusion  Plan -BiPAP prn - IR consult for thora Left side - if malignant cells + or effusion recurs or exudate then might need pleurx  - Risks of pneumothorax, hemothorax, sedation/anesthesia complications such as cardiac or respiratory arrest or hypotension, stroke and bleeding all explained. Will need to stop heparin prior to procedure Benefits of diagnosis but limitations of non-diagnosis also explained. Patient verbalized understanding and wished to proceed.   Dc antibiotics (PCT near normal, afebrile, normal wbc) -steroids for possible pneumonitis  -assess response to IV diuresis   Stage IIIb Non-Small Cell Lung Cancer  Treated with carboplatin, paclitaxel.  Now on Imfinzi -supportive care   Severe Aortic Stenosis  Pericardial Effusion  Not an emergent valvular  replacement candidate per Cardiology last note -reassess ECHO to ensure effusion not enlarging     Best practice:  Diet: NPO while on BiPAP Pain/Anxiety/Delirium protocol (if indicated): n/a VAP protocol (if indicated): n/a  DVT prophylaxis: per primary  GI prophylaxis: per primary  Glucose control: per primary  Mobility: As tolerated  Code Status: DNR / DNI  Family Communication: Patient at bedside Disposition: SDU, per [facility]      SIGNATURE    Dr. Brand Males, M.D., F.C.C.P,  Pulmonary and Critical Care Medicine Staff Physician, [place] Director - Interstitial Lung Disease  Program  Pulmonary [place] [address] [place] [facility] [place], [place], [address]  Pager: [phone], If no answer or between  15:00h - 7:00h: call [phone] Telephone: [phone]  11:02 AM 03/16/2020    [facility]  Lab 03/15/20  1411  PHART 7.405  PCO2ART 33.9  PO2ART 69.7*  HCO3 20.8  O2SAT 92.4    CBC Recent Labs  Lab 03/15/20 1213 03/16/20 0236  HGB 11.4* 10.2*  HCT 35.7* 31.8*  WBC 11.3* 5.0  PLT 354 247    COAGULATION No results for input(s): INR in the last 168 hours.  CARDIAC  No results for input(s): TROPONINI in the last 168 hours. No results for input(s): PROBNP in the last 168 hours.   CHEMISTRY Recent Labs  Lab 03/15/20 1213 03/16/20 0236  NA 143 144  K 4.1 4.5  CL 108 112*  CO2 22 21*  GLUCOSE 247* 152*  BUN 26* 32*  CREATININE 1.18 1.24  CALCIUM 8.8* 8.9   Estimated Creatinine Clearance: 47.5 mL/min (by C-G formula based on SCr of 1.24 mg/dL).   LIVER No results for input(s): AST, ALT, ALKPHOS, BILITOT, PROT, ALBUMIN, INR in the last 168 hours.   INFECTIOUS Recent Labs  Lab 03/15/20 1213 03/15/20 1537 03/15/20 1949 03/16/20 0236  LATICACIDVEN 1.8 1.5  --   --   PROCALCITON  --   --  0.13 <0.10     ENDOCRINE CBG (last 3)  No results for input(s): GLUCAP in the  last 72 hours.       IMAGING x48h  - image(s) personally visualized  -   highlighted in bold CT Chest High Resolution  Result Date: 03/15/2020 CLINICAL DATA:  Interstitial lung disease, shortness of breath EXAM: CT CHEST WITHOUT CONTRAST TECHNIQUE: Multidetector CT imaging of the chest was performed following the standard protocol without intravenous contrast. High resolution imaging of the lungs, as well as inspiratory and expiratory imaging, was performed. COMPARISON:  12/01/2019 FINDINGS: Cardiovascular: Aortic atherosclerosis. Dense aortic valve calcifications. Normal heart size. Extensive coronary artery calcifications and/or stents. Small pericardial effusion, unchanged compared to prior examination. Mediastinum/Nodes: No enlarged mediastinal, hilar, or axillary lymph nodes. Thyroid gland, trachea, and esophagus demonstrate no significant findings. Lungs/Pleura: There are bilateral, left greater than right moderate pleural effusions and associated atelectasis or consolidation, significantly increased compared to prior examination. Severe centrilobular emphysema. Post treatment consolidation and fibrosis of the perihilar and infrahilar left lung, generally similar to prior examination although significantly obscured by enlarged left pleural effusion. Upper Abdomen: No acute abnormality. Musculoskeletal: No chest wall mass or suspicious bone lesions identified. Unchanged wedge deformity of the T7 vertebral body. IMPRESSION: 1. There are bilateral, left greater than right moderate pleural effusions and associated atelectasis or consolidation, significantly increased compared to prior examination. 2. Post treatment consolidation and fibrosis of the perihilar and infrahilar left lung, generally similar to prior examination although significantly obscured by enlarged left pleural effusion. 3.  Severe emphysema.  Emphysema (ICD10-J43.9). 4. No evidence of fibrotic interstitial lung disease; the dependent  bilateral lungs are not adequately assessed given the presence of pleural effusions. 5.  Coronary artery disease. 6. Dense aortic valve calcifications. Correlate with echocardiographic findings of valve function. 7.  Aortic Atherosclerosis (ICD10-I70.0). 8. Small pericardial effusion, unchanged compared to prior examination. Electronically Signed   By: Eddie Candle M.D.   On: 03/15/2020 18:56   DG Chest Port 1 View  Result Date: 03/15/2020 CLINICAL DATA:  Increasing shortness of breath for the past week. History of lung cancer and COPD. EXAM: PORTABLE CHEST 1 VIEW COMPARISON:  CT chest dated December 01, 2019. Chest x-ray dated October 08, 2019. FINDINGS: Unchanged right chest wall port catheter. The cardiac silhouette is increasingly obscured. Normal mediastinal contours. New diffuse interstitial thickening with increasing now  moderate left and new small right pleural effusions. Left greater than right basilar opacities, favoring atelectasis. No pneumothorax. No acute osseous abnormality. IMPRESSION: New interstitial pulmonary edema with increased moderate left and new small right pleural effusions. Electronically Signed   By: Titus Dubin M.D.   On: 03/15/2020 12:51

## 2020-03-16 NOTE — Progress Notes (Signed)
Patient remains off BiPAP. No distress noted.

## 2020-03-16 NOTE — Procedures (Signed)
Ultrasound-guided diagnostic and therapeutic left sided thoracentesis performed yielding 1170 milliters of straw colored fluid. No immediate complications.   Diagnostic fluid was sent to the lab for further analysis. Follow-up chest x-ray pending. EBL is none.

## 2020-03-16 NOTE — Progress Notes (Signed)
ANTICOAGULATION CONSULT NOTE - Follow Up Consult  Pharmacy Consult for Heparin Indication: chest pain/ACS  No Known Allergies  Patient Measurements:   Heparin Dosing Weight:   Vital Signs: Temp: 97.5 F (36.4 C) (04/20 0400) Temp Source: Axillary (04/20 0400) BP: 113/65 (04/20 0400) Pulse Rate: 103 (04/20 0400)  Labs: Recent Labs    03/15/20 1213 03/15/20 1537 03/15/20 1949 03/16/20 0236  HGB 11.4*  --   --  10.2*  HCT 35.7*  --   --  31.8*  PLT 354  --   --  247  HEPARINUNFRC  --   --   --  0.28*  CREATININE 1.18  --   --  1.24  TROPONINIHS 85* 127* 134*  --     Estimated Creatinine Clearance: 47.5 mL/min (by C-G formula based on SCr of 1.24 mg/dL).   Medications:  Infusions:  . azithromycin Stopped (03/15/20 2118)  . heparin 1,000 Units/hr (03/16/20 0433)    Assessment: Patient with low heparin level.  No heparin issues per RN.  Goal of Therapy:  Heparin level 0.3-0.7 units/ml Monitor platelets by anticoagulation protocol: Yes   Plan:  Increase heparin to 1000 units/hr Recheck level at [address] [place] 03/16/2020,4:35 AM

## 2020-03-16 NOTE — Progress Notes (Signed)
PROGRESS NOTE    LAMICHAEL YOUKHANA  UTM:[id number] DOB: 03/20/1941 DOA: 03/15/2020 PCP: [name], MD   Brief Narrative: Robert Dixon is a 79 y.o. male with medical history significant for lung cancer, COPD on chronic 3 L oxygen, aortic stenosis, pericardial effusion. Patient presented secondary to dyspnea with concern for a COPD exacerbation. On admission he required BiPAP and was given IV lasix for noted pleural effusions   Assessment & Plan:   Principal Problem:   Acute on chronic respiratory failure with hypoxia (HCC) Active Problems:   Non-small cell carcinoma of left lung, stage 3 (HCC)   Pleural effusion   Nonrheumatic aortic valve stenosis   Pericardial effusion   COPD exacerbation (HCC)   Acute on chronic respiratory failure with hypoxia Likely related to COPD exacerbation and complicated by underlying lung cancer. Pulmonology consulted with concern for possible inflammatory component. Patient also noted to have bilateral pleural effusions concerning for possible heart failure exacerbation, although last EF was 60-65%. Managed on BiPAP initially and weaned to nasal canula. Baseline oxygen is between 2-3 L/min -Wean oxygen to baseline as able -PT/OT eval  COPD exacerbation Initial diagnosis. Empiric treatment started with Azithromycin, Solu-medrol and Duonebs. Pulmonology discontinued azithromycin secondary to normal procalcitonin -Continue Pulmicort, Brovana Duoneb, Solumedrol -Pulmonology recommendations  Bilateral pleural effusions Likely contributing to hypoxia. Possibly related to heart failure however patient's last noted heart function was normal. Patient also has underlying cancer. No thoracentesis performed on admission. Pulmonology recommending thoracentesis per IR -Await thoracentesis fluid analysis results  Severe aortic stenosis Cardiology consulted. Transthoracic Echocardiogram pending -Cardiology recommendations  Pericardial  effusion Chronic -Transthoracic Echocardiogram pending  Elevated troponin Cardiology consulted with low concern for ACS. Transthoracic Echocardiogram ordered. -Cardiology recommendations: Transthoracic Echocardiogram, stop heparin drip  Stage IIIb non-small cell lung cancer On [place]fin[place]. Completed chemoradiation with concurrent weakly carboplatin and paclitaxel. Follows with Dr. Julien Nordmann.   DVT prophylaxis: Heparin subq Code Status:   Code Status: DNR Family Communication: None at baseline Disposition Plan: Discharge possibly home once weaned to baseline oxygen and pending pulmonology/cardiology recommendations   Consultants:   Pulmonology  Cardiology  Procedures:   BiPAP  Antimicrobials:  Azithromycin    Subjective: Breathing is improved. Baseline dyspnea but current dyspnea is much closer to baseline than on admission. Describes himself as being 4 steps away from his baseline. No chest pain.  Objective: Vitals:   03/16/20 0700 03/16/20 0747 03/16/20 0800 03/16/20 0900  BP: (!) 88/53  104/64 (!) 118/52  Pulse: (!) 101  (!) 105 (!) 118  Resp: (!) 23  (!) 22 (!) 27  Temp:      TempSrc:      SpO2: 93% 96% 100% 93%    Intake/Output Summary (Last 24 hours) at 03/16/2020 1013 Last data filed at 03/16/2020 0900 Gross per 24 hour  Intake 1651.89 ml  Output 1000 ml  Net 651.89 ml   There were no vitals filed for this visit.  Examination:  General exam: Appears calm and comfortable Respiratory system: Diminished. Respiratory effort normal. Cardiovascular system: S1 & S2 heard, RRR. 2/6 systolic murmur Gastrointestinal system: Abdomen is nondistended, soft and nontender. Normal bowel sounds heard. Central nervous system: Alert. No focal neurological deficits. Extremities: No edema. No calf tenderness Skin: No cyanosis. No rashes Psychiatry: Judgement and insight appear normal. Mood & affect appropriate.     Data Reviewed: I have personally reviewed following labs  and imaging studies  CBC: Recent Labs  Lab 03/09/20 1030 03/15/20 1213 03/16/20 0236  WBC 5.8  11.3* 5.0  NEUTROABS 4.4 9.8*  --   HGB 9.5* 11.4* 10.2*  HCT 29.2* 35.7* 31.8*  MCV 120.7* 124.4* 126.2*  PLT 213 354 431   Basic Metabolic Panel: Recent Labs  Lab 03/09/20 1030 03/15/20 1213 03/16/20 0236  NA 141 143 144  K 4.5 4.1 4.5  CL 110 108 112*  CO2 23 22 21*  GLUCOSE 123* 247* 152*  BUN 18 26* 32*  CREATININE 1.03 1.18 1.24  CALCIUM 8.5* 8.8* 8.9   GFR: Estimated Creatinine Clearance: 47.5 mL/min (by C-G formula based on SCr of 1.24 mg/dL). Liver Function Tests: Recent Labs  Lab 03/09/20 1030  AST 15  ALT 12  ALKPHOS 93  BILITOT 0.7  PROT 6.2*  ALBUMIN 3.6   No results for input(s): LIPASE, AMYLASE in the last 168 hours. No results for input(s): AMMONIA in the last 168 hours. Coagulation Profile: No results for input(s): INR, PROTIME in the last 168 hours. Cardiac Enzymes: No results for input(s): CKTOTAL, CKMB, CKMBINDEX, TROPONINI in the last 168 hours. BNP (last 3 results) No results for input(s): PROBNP in the last 8760 hours. HbA1C: No results for input(s): HGBA1C in the last 72 hours. CBG: No results for input(s): GLUCAP in the last 168 hours. Lipid Profile: No results for input(s): CHOL, HDL, LDLCALC, TRIG, CHOLHDL, LDLDIRECT in the last 72 hours. Thyroid Function Tests: No results for input(s): TSH, T4TOTAL, FREET4, T3FREE, THYROIDAB in the last 72 hours. Anemia Panel: No results for input(s): VITAMINB12, FOLATE, FERRITIN, TIBC, IRON, RETICCTPCT in the last 72 hours. Sepsis Labs: Recent Labs  Lab 03/15/20 1213 03/15/20 1537 03/15/20 1949 03/16/20 0236  PROCALCITON  --   --  0.13 <0.10  LATICACIDVEN 1.8 1.5  --   --     Recent Results (from the past 240 hour(s))  Respiratory Panel by RT PCR (Flu A&B, Covid) - Nasopharyngeal Swab     Status: None   Collection Time: 03/15/20 12:13 PM   Specimen: Nasopharyngeal Swab  Result Value Ref  Range Status   SARS Coronavirus 2 by RT PCR NEGATIVE NEGATIVE Final    Comment: (NOTE) SARS-CoV-2 target nucleic acids are NOT DETECTED. The SARS-CoV-2 RNA is generally detectable in upper respiratoy specimens during the acute phase of infection. The lowest concentration of SARS-CoV-2 viral copies this assay can detect is 131 copies/mL. A negative result does not preclude SARS-Cov-2 infection and should not be used as the sole basis for treatment or other patient management decisions. A negative result may occur with  improper specimen collection/handling, submission of specimen other than nasopharyngeal swab, presence of viral mutation(s) within the areas targeted by this assay, and inadequate number of viral copies (<131 copies/mL). A negative result must be combined with clinical observations, patient history, and epidemiological information. The expected result is Negative. Fact Sheet for Patients:  PinkCheek.be Fact Sheet for Healthcare Providers:  GravelBags.it This test is not yet ap proved or cleared by the [place] FDA and  has been authorized for detection and/or diagnosis of SARS-CoV-2 by FDA under an Emergency Use Authorization (EUA). This EUA will remain  in effect (meaning this test can be used) for the duration of the COVID-19 declaration under Section 564(b)(1) of the Act, 21 U.S.C. section 360bbb-3(b)(1), unless the authorization is terminated or revoked sooner.    Influenza A by PCR NEGATIVE NEGATIVE Final   Influenza B by PCR NEGATIVE NEGATIVE Final    Comment: (NOTE) The Xpert Xpress SARS-CoV-2/FLU/RSV assay is intended as an aid in  the  diagnosis of influenza from Nasopharyngeal swab specimens and  should not be used as a sole basis for treatment. Nasal washings and  aspirates are unacceptable for Xpert Xpress SARS-CoV-2/FLU/RSV  testing. Fact Sheet for  Patients: PinkCheek.be Fact Sheet for Healthcare Providers: GravelBags.it This test is not yet approved or cleared by the [place] FDA and  has been authorized for detection and/or diagnosis of SARS-CoV-2 by  FDA under an Emergency Use Authorization (EUA). This EUA will remain  in effect (meaning this test can be used) for the duration of the  Covid-19 declaration under Section 564(b)(1) of the Act, 21  U.S.C. section 360bbb-3(b)(1), unless the authorization is  terminated or revoked. Performed at [facility], [place] [address], [place], [place] [address]   MRSA PCR Screening     Status: None   Collection Time: 03/15/20  9:22 PM   Specimen: Nasal Mucosa; Nasopharyngeal  Result Value Ref Range Status   MRSA by PCR NEGATIVE NEGATIVE Final    Comment:        The GeneXpert MRSA Assay (FDA approved for NASAL specimens only), is one component of a comprehensive MRSA colonization surveillance program. It is not intended to diagnose MRSA infection nor to guide or monitor treatment for MRSA infections. Performed at [facility], [place] [address], [place], [place] [address]          Radiology Studies: CT Chest High Resolution  Result Date: 03/15/2020 CLINICAL DATA:  Interstitial lung disease, shortness of breath EXAM: CT CHEST WITHOUT CONTRAST TECHNIQUE: Multidetector CT imaging of the chest was performed following the standard protocol without intravenous contrast. High resolution imaging of the lungs, as well as inspiratory and expiratory imaging, was performed. COMPARISON:  12/01/2019 FINDINGS: Cardiovascular: Aortic atherosclerosis. Dense aortic valve calcifications. Normal heart size. Extensive coronary artery calcifications and/or stents. Small pericardial effusion, unchanged compared to prior examination. Mediastinum/Nodes: No enlarged mediastinal, hilar, or axillary lymph nodes.  Thyroid gland, trachea, and esophagus demonstrate no significant findings. Lungs/Pleura: There are bilateral, left greater than right moderate pleural effusions and associated atelectasis or consolidation, significantly increased compared to prior examination. Severe centrilobular emphysema. Post treatment consolidation and fibrosis of the perihilar and infrahilar left lung, generally similar to prior examination although significantly obscured by enlarged left pleural effusion. Upper Abdomen: No acute abnormality. Musculoskeletal: No chest wall mass or suspicious bone lesions identified. Unchanged wedge deformity of the T7 vertebral body. IMPRESSION: 1. There are bilateral, left greater than right moderate pleural effusions and associated atelectasis or consolidation, significantly increased compared to prior examination. 2. Post treatment consolidation and fibrosis of the perihilar and infrahilar left lung, generally similar to prior examination although significantly obscured by enlarged left pleural effusion. 3.  Severe emphysema.  Emphysema (ICD10-J43.9). 4. No evidence of fibrotic interstitial lung disease; the dependent bilateral lungs are not adequately assessed given the presence of pleural effusions. 5.  Coronary artery disease. 6. Dense aortic valve calcifications. Correlate with echocardiographic findings of valve function. 7.  Aortic Atherosclerosis (ICD10-I70.0). 8. Small pericardial effusion, unchanged compared to prior examination. Electronically Signed   By: Eddie Candle M.D.   On: 03/15/2020 18:56   DG Chest Port 1 View  Result Date: 03/15/2020 CLINICAL DATA:  Increasing shortness of breath for the past week. History of lung cancer and COPD. EXAM: PORTABLE CHEST 1 VIEW COMPARISON:  CT chest dated December 01, 2019. Chest x-ray dated October 08, 2019. FINDINGS: Unchanged right chest wall port catheter. The cardiac silhouette is increasingly obscured. Normal mediastinal contours.  New diffuse  interstitial thickening with increasing now moderate left and new small right pleural effusions. Left greater than right basilar opacities, favoring atelectasis. No pneumothorax. No acute osseous abnormality. IMPRESSION: New interstitial pulmonary edema with increased moderate left and new small right pleural effusions. Electronically Signed   By: Titus Dubin M.D.   On: 03/15/2020 12:51        Scheduled Meds: . arformoterol  15 mcg Nebulization BID  . budesonide (PULMICORT) nebulizer solution  0.5 mg Nebulization BID  . chlorhexidine  15 mL Mouth Rinse BID  . Chlorhexidine Gluconate Cloth  6 each Topical Daily  . ipratropium  0.5 mg Nebulization Q6H  . mouth rinse  15 mL Mouth Rinse q12n4p  . methylPREDNISolone (SOLU-MEDROL) injection  40 mg Intravenous Q6H  . pantoprazole  40 mg Oral BID  . polycarbophil  625 mg Oral BID  . tamsulosin  0.4 mg Oral Daily   Continuous Infusions: . azithromycin Stopped (03/15/20 2118)  . heparin 1,000 Units/hr (03/16/20 0900)     LOS: 1 day     Cordelia Poche, MD [facility] 03/16/2020, 10:13 AM  If 7PM-7AM, please contact night-coverage [email or web address]

## 2020-03-17 DIAGNOSIS — C3492 Malignant neoplasm of unspecified part of left bronchus or lung: Secondary | ICD-10-CM

## 2020-03-17 DIAGNOSIS — J9621 Acute and chronic respiratory failure with hypoxia: Secondary | ICD-10-CM

## 2020-03-17 DIAGNOSIS — I350 Nonrheumatic aortic (valve) stenosis: Secondary | ICD-10-CM

## 2020-03-17 LAB — CBC
HCT: 31.1 % — ABNORMAL LOW (ref 39.0–52.0)
Hemoglobin: 9.7 g/dL — ABNORMAL LOW (ref 13.0–17.0)
MCH: 39 pg — ABNORMAL HIGH (ref 26.0–34.0)
MCHC: 31.2 g/dL (ref 30.0–36.0)
MCV: 124.9 fL — ABNORMAL HIGH (ref 80.0–100.0)
Platelets: 264 10*3/uL (ref 150–400)
RBC: 2.49 MIL/uL — ABNORMAL LOW (ref 4.22–5.81)
RDW: 15.7 % — ABNORMAL HIGH (ref 11.5–15.5)
WBC: 21.5 10*3/uL — ABNORMAL HIGH (ref 4.0–10.5)
nRBC: 0 % (ref 0.0–0.2)

## 2020-03-17 LAB — LIPID PANEL
Cholesterol: 116 mg/dL (ref 0–200)
HDL: 38 mg/dL — ABNORMAL LOW (ref >40)
LDL Cholesterol: 64 mg/dL (ref 0–99)
Total CHOL/HDL Ratio: 3.1 RATIO
Triglycerides: 70 mg/dL (ref <150)
VLDL: 14 mg/dL (ref 0–40)

## 2020-03-17 LAB — BASIC METABOLIC PANEL
Anion gap: 11 (ref 5–15)
BUN: 51 mg/dL — ABNORMAL HIGH (ref 8–23)
CO2: 24 mmol/L (ref 22–32)
Calcium: 8 mg/dL — ABNORMAL LOW (ref 8.9–10.3)
Chloride: 107 mmol/L (ref 98–111)
Creatinine, Ser: 1.42 mg/dL — ABNORMAL HIGH (ref 0.61–1.24)
GFR calc Af Amer: 54 mL/min — ABNORMAL LOW (ref >60)
GFR calc non Af Amer: 47 mL/min — ABNORMAL LOW (ref >60)
Glucose, Bld: 145 mg/dL — ABNORMAL HIGH (ref 70–99)
Potassium: 3.9 mmol/L (ref 3.5–5.1)
Sodium: 142 mmol/L (ref 135–145)

## 2020-03-17 LAB — HEMOGLOBIN A1C
Hgb A1c MFr Bld: 4.3 % — ABNORMAL LOW (ref 4.8–5.6)
Mean Plasma Glucose: 76.71 mg/dL

## 2020-03-17 LAB — TRIGLYCERIDES, BODY FLUIDS: Triglycerides, Fluid: <9 mg/dL

## 2020-03-17 LAB — PROCALCITONIN: Procalcitonin: <0.1 ng/mL

## 2020-03-17 MED ORDER — SODIUM CHLORIDE 0.9% FLUSH
10.0000 mL | Freq: Two times a day (BID) | INTRAVENOUS | Status: DC
  Administered 2020-03-17 – 2020-03-18 (×3): 10 mL
  Administered 2020-03-19: 20 mL
  Administered 2020-03-19 – 2020-03-21 (×4): 10 mL
  Administered 2020-03-22: 20 mL
  Administered 2020-03-22 – 2020-03-25 (×6): 10 mL

## 2020-03-17 MED ORDER — ASPIRIN 81 MG PO CHEW
81.0000 mg | CHEWABLE_TABLET | Freq: Every day | ORAL | Status: DC
  Administered 2020-03-17 – 2020-03-28 (×12): 81 mg via ORAL
  Filled 2020-03-17 (×12): qty 1

## 2020-03-17 MED ORDER — ALBUTEROL SULFATE (2.5 MG/3ML) 0.083% IN NEBU: 2.5000 mg | INHALATION_SOLUTION | Freq: Four times a day (QID) | RESPIRATORY_TRACT | Status: DC

## 2020-03-17 MED ORDER — SODIUM CHLORIDE 0.9% FLUSH
10.0000 mL | INTRAVENOUS | Status: DC | PRN
  Administered 2020-03-27: 08:00:00 10 mL

## 2020-03-17 MED ORDER — ALUM & MAG HYDROXIDE-SIMETH 200-200-20 MG/5ML PO SUSP
15.0000 mL | Freq: Four times a day (QID) | ORAL | Status: DC | PRN
  Administered 2020-03-20 – 2020-03-21 (×2): 15 mL via ORAL
  Filled 2020-03-17 (×2): qty 30

## 2020-03-17 MED ORDER — IPRATROPIUM-ALBUTEROL 0.5-2.5 (3) MG/3ML IN SOLN
3.0000 mL | Freq: Four times a day (QID) | RESPIRATORY_TRACT | Status: DC
  Administered 2020-03-17 – 2020-03-19 (×9): 3 mL via RESPIRATORY_TRACT
  Filled 2020-03-17 (×9): qty 3

## 2020-03-17 NOTE — Progress Notes (Signed)
Pt remains off BIPAP at this time.

## 2020-03-17 NOTE — Progress Notes (Signed)
NAME:  Robert Dixon, MRN:  [id number], DOB:  [birth year], LOS: 2 ADMISSION DATE:  03/15/2020, CONSULTATION DATE:  4/19 REFERRING MD:  Gilford Raid, CHIEF COMPLAINT:  Dyspnea  Brief History   79 y/o male with advanced non-small cell lung cancer and aortic stenosis admitted for acute on chronic repiratory failure with hypoxemia in the setting of a recurrent left sided pleural effusion.   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 Severe aortic stenosis Left pleural effusion COPD CHF Chronic respiratory failure with hypoxemia Pericardial effusion GERD Hiatal hernia HTN Migraine headache  Significant Hospital Events     Consults:  PCCM  Procedures:  4/20 left thoracentesis > 1100 cc fluid removed  Significant Diagnostic Tests:  CXR 4/19 > new interstitial pulm edema ESR 4/19 PCT 4/19  CT Chest High Resolution 4/19 > emphysema bilaterally, left lung mass, bilateral effusion L>R  Micro Data:  COVID 4/19 >> negative  Influenza A/B 4/19 >> negative  4/19 blood culture > > Left pleural fluid 4/2 >   Antimicrobials:  Azithromycin 4/19 >>   Interim history/subjective:  Feels better  Had thoracentesis yesterday by IR  Objective   Blood pressure (!) 86/58, pulse 94, temperature 97.9 F (36.6 C), temperature source Oral, resp. rate (!) 28, height '5\' 8"'  (1.727 m), weight 68.7 kg, SpO2 93 %.        Intake/Output Summary (Last 24 hours) at 03/17/2020 1036 Last data filed at 03/17/2020 1002 Gross per 24 hour  Intake 250 ml  Output 1675 ml  Net -1425 ml   Filed Weights   03/17/20 0500 03/17/20 0836  Weight: 68.7 kg 68.7 kg    Examination: General:  Resting comfortably in bed HENT: NCAT OP clear PULM: Diminished bilaterally B, normal effort CV: RRR, systolic murmur apex GI: BS+, soft, nontender MSK: normal bulk and tone Neuro: awake, alert, no distress, MAEW   03/16/2020 left pleural fluid analysis: Albumin 1.9 gm/dL LDH  89 T protine < 3.0 Total nucleated cells 309, 89% Lymph  Resolved Hospital Problem list     Assessment & Plan:  Acute on chronic respiratory failure with hypoxemia> suspect with 10 pound weight gain in last month with transudative fluid this is likely fluid retention related to his cardiac disease Would consider diuresis again on 4/22 based on how he feels and how is kidney function is doing Wean off O2 to maintain O2 saturation > 88%  Left sided pleural effusion> transudative based on pleural fluid to serum albumin gradient > 1.0 gm/dL F/u pleural fluid cytology Diuresis as detailed above  Stage IIIb non-small cell lung cancer F/u pleural fluid cytology  Severe aortic stenosis Per cardiology  Move to floor  Best practice:    Labs   CBC: Recent Labs  Lab 03/15/20 1213 03/16/20 0236 03/17/20 0525  WBC 11.3* 5.0 21.5*  NEUTROABS 9.8*  --   --   HGB 11.4* 10.2* 9.7*  HCT 35.7* 31.8* 31.1*  MCV 124.4* 126.2* 124.9*  PLT 354 247 102    Basic Metabolic Panel: Recent Labs  Lab 03/15/20 1213 03/16/20 0236 03/17/20 0525  NA 143 144 142  K 4.1 4.5 3.9  CL 108 112* 107  CO2 22 21* 24  GLUCOSE 247* 152* 145*  BUN 26* 32* 51*  CREATININE 1.18 1.24 1.42*  CALCIUM 8.8* 8.9 8.0*   GFR: Estimated Creatinine Clearance: 41.5 mL/min (A) (by C-G formula based on SCr of 1.42 mg/dL (H)). Recent Labs  Lab 03/15/20 1213 03/15/20 1537  03/15/20 1949 03/16/20 0236 03/17/20 0525  PROCALCITON  --   --  0.13 <0.10 <0.10  WBC 11.3*  --   --  5.0 21.5*  LATICACIDVEN 1.8 1.5  --   --   --     Liver Function Tests: No results for input(s): AST, ALT, ALKPHOS, BILITOT, PROT, ALBUMIN in the last 168 hours. No results for input(s): LIPASE, AMYLASE in the last 168 hours. No results for input(s): AMMONIA in the last 168 hours.  ABG    Component Value Date/Time   PHART 7.405 03/15/2020 1411   PCO2ART 33.9 03/15/2020 1411   PO2ART 69.7 (L) 03/15/2020 1411   HCO3 20.8 03/15/2020  1411   TCO2 17.0 04/06/2013 0003   ACIDBASEDEF 2.8 (H) 03/15/2020 1411   O2SAT 92.4 03/15/2020 1411     Coagulation Profile: No results for input(s): INR, PROTIME in the last 168 hours.  Cardiac Enzymes: No results for input(s): CKTOTAL, CKMB, CKMBINDEX, TROPONINI in the last 168 hours.  HbA1C: No results found for: HGBA1C  CBG: No results for input(s): GLUCAP in the last 168 hours.   Critical care time: n/a    Roselie Awkward, MD [facility] Pager: [phone] Cell: [phone] If no response, call [phone]

## 2020-03-17 NOTE — Progress Notes (Signed)
PROGRESS NOTE    Robert Dixon  SFK:[id number] DOB: 1941-03-18 DOA: 03/15/2020 PCP: [name], MD   Brief Narrative: Robert Dixon is a 79 y.o. malewith medical history significant forlung cancer, COPD on chronic 3 L oxygen, aortic stenosis, pericardial effusion. Patient presented secondary to dyspnea with concern for a COPD exacerbation. On admission he required BiPAP and was given IV lasix for noted pleural effusions   Assessment & Plan:   Principal Problem:   Acute on chronic respiratory failure with hypoxia (HCC) Active Problems:   Non-small cell carcinoma of left lung, stage 3 (HCC)   Pleural effusion   Nonrheumatic aortic valve stenosis   Pericardial effusion   COPD exacerbation (HCC)   1-Chronic respiratory failure with hypoxemia Secondary to COPD, complicated by underlying lung cancer, pleural effusion and heart failure exacerbation. Required BiPAP initially Underwent thoracentesis 4-20 yielding 1.1 L fluid.  Awaiting cytology Further  diuretic per cardiology  2-left side pleural effusion, transudative: Follow cytology Underwent thoracentesis on 4/20  3-stage IIIb non-small lung cancer: With Dr. Jenetta Loges aortic stenosis systolic heart failure exacerbation: Conservative management for aortic valve. Plan to hold diuretics today and repeat renal function tomorrow 5-COPD exacerbation: Continue with Pulmicort Brovana and scheduled nebulizers Solu-Medrol 6-Leukocytosis: Suspect related to her steroid. Estimated body mass index is 23.03 kg/m as calculated from the following:   Height as of this encounter: 5\' 8"  (1.727 m).   Weight as of this encounter: 68.7 kg.   DVT prophylaxis: Heparin Code Status: DNR Family Communication: Care discussed directly with patient Disposition Plan:  Patient is from: Home Anticipated d/c date: 2 or 3 days Barriers to d/c or necessity for inpatient status: When respiratory status improved, and cardiology cleared patient  for discharge  Consultants:   Cardiology  Pulmonology  Procedures:     Antimicrobials:    Subjective: Breathing better than yesterday, still some shortness of breath.  Objective: Vitals:   03/17/20 0800 03/17/20 0834 03/17/20 0835 03/17/20 0836  BP: (!) 101/54   (!) 94/59  Pulse: 99   (!) 103  Resp: (!) 22   (!) 21  Temp: 97.9 F (36.6 C)   97.9 F (36.6 C)  TempSrc: Oral   Oral  SpO2: 93% 95% 95%   Weight:    68.7 kg  Height:    5\' 8"  (1.727 m)    Intake/Output Summary (Last 24 hours) at 03/17/2020 0857 Last data filed at 03/17/2020 0600 Gross per 24 hour  Intake 379.98 ml  Output 2075 ml  Net -1695.02 ml   Filed Weights   03/17/20 0500 03/17/20 0836  Weight: 68.7 kg 68.7 kg    Examination:  General exam: Appears calm and comfortable  Respiratory system: Normal respiratory effort, bilateral crackles at the bases. Cardiovascular system: S1 & S2 heard, RRR. Gastrointestinal system: Abdomen is nondistended, soft and nontender. No organomegaly or masses felt. Normal bowel sounds heard. Central nervous system: Alert and oriented. No focal neurological deficits. Extremities: Symmetric 5 x 5 power.    Data Reviewed: I have personally reviewed following labs and imaging studies  CBC: Recent Labs  Lab 03/15/20 1213 03/16/20 0236 03/17/20 0525  WBC 11.3* 5.0 21.5*  NEUTROABS 9.8*  --   --   HGB 11.4* 10.2* 9.7*  HCT 35.7* 31.8* 31.1*  MCV 124.4* 126.2* 124.9*  PLT 354 247 174   Basic Metabolic Panel: Recent Labs  Lab 03/15/20 1213 03/16/20 0236 03/17/20 0525  NA 143 144 142  K 4.1 4.5 3.9  CL  108 112* 107  CO2 22 21* 24  GLUCOSE 247* 152* 145*  BUN 26* 32* 51*  CREATININE 1.18 1.24 1.42*  CALCIUM 8.8* 8.9 8.0*   GFR: Estimated Creatinine Clearance: 41.5 mL/min (A) (by C-G formula based on SCr of 1.42 mg/dL (H)). Liver Function Tests: No results for input(s): AST, ALT, ALKPHOS, BILITOT, PROT, ALBUMIN in the last 168 hours. No results for  input(s): LIPASE, AMYLASE in the last 168 hours. No results for input(s): AMMONIA in the last 168 hours. Coagulation Profile: No results for input(s): INR, PROTIME in the last 168 hours. Cardiac Enzymes: No results for input(s): CKTOTAL, CKMB, CKMBINDEX, TROPONINI in the last 168 hours. BNP (last 3 results) No results for input(s): PROBNP in the last 8760 hours. HbA1C: No results for input(s): HGBA1C in the last 72 hours. CBG: No results for input(s): GLUCAP in the last 168 hours. Lipid Profile: No results for input(s): CHOL, HDL, LDLCALC, TRIG, CHOLHDL, LDLDIRECT in the last 72 hours. Thyroid Function Tests: No results for input(s): TSH, T4TOTAL, FREET4, T3FREE, THYROIDAB in the last 72 hours. Anemia Panel: No results for input(s): VITAMINB12, FOLATE, FERRITIN, TIBC, IRON, RETICCTPCT in the last 72 hours. Sepsis Labs: Recent Labs  Lab 03/15/20 1213 03/15/20 1537 03/15/20 1949 03/16/20 0236 03/17/20 0525  PROCALCITON  --   --  0.13 <0.10 <0.10  LATICACIDVEN 1.8 1.5  --   --   --     Recent Results (from the past 240 hour(s))  Respiratory Panel by RT PCR (Flu A&B, Covid) - Nasopharyngeal Swab     Status: None   Collection Time: 03/15/20 12:13 PM   Specimen: Nasopharyngeal Swab  Result Value Ref Range Status   SARS Coronavirus 2 by RT PCR NEGATIVE NEGATIVE Final    Comment: (NOTE) SARS-CoV-2 target nucleic acids are NOT DETECTED. The SARS-CoV-2 RNA is generally detectable in upper respiratoy specimens during the acute phase of infection. The lowest concentration of SARS-CoV-2 viral copies this assay can detect is 131 copies/mL. A negative result does not preclude SARS-Cov-2 infection and should not be used as the sole basis for treatment or other patient management decisions. A negative result may occur with  improper specimen collection/handling, submission of specimen other than nasopharyngeal swab, presence of viral mutation(s) within the areas targeted by this assay,  and inadequate number of viral copies (<131 copies/mL). A negative result must be combined with clinical observations, patient history, and epidemiological information. The expected result is Negative. Fact Sheet for Patients:  PinkCheek.be Fact Sheet for Healthcare Providers:  GravelBags.it This test is not yet ap proved or cleared by the [place] FDA and  has been authorized for detection and/or diagnosis of SARS-CoV-2 by FDA under an Emergency Use Authorization (EUA). This EUA will remain  in effect (meaning this test can be used) for the duration of the COVID-19 declaration under Section 564(b)(1) of the Act, 21 U.S.C. section 360bbb-3(b)(1), unless the authorization is terminated or revoked sooner.    Influenza A by PCR NEGATIVE NEGATIVE Final   Influenza B by PCR NEGATIVE NEGATIVE Final    Comment: (NOTE) The Xpert Xpress SARS-CoV-2/FLU/RSV assay is intended as an aid in  the diagnosis of influenza from Nasopharyngeal swab specimens and  should not be used as a sole basis for treatment. Nasal washings and  aspirates are unacceptable for Xpert Xpress SARS-CoV-2/FLU/RSV  testing. Fact Sheet for Patients: PinkCheek.be Fact Sheet for Healthcare Providers: GravelBags.it This test is not yet approved or cleared by the [place] and  has been authorized for detection and/or diagnosis of SARS-CoV-2 by  FDA under an Emergency Use Authorization (EUA). This EUA will remain  in effect (meaning this test can be used) for the duration of the  Covid-19 declaration under Section 564(b)(1) of the Act, 21  U.S.C. section 360bbb-3(b)(1), unless the authorization is  terminated or revoked. Performed at [facility], [place] [address], [place], [place] [address]   Culture, blood (routine x 2)     Status: None (Preliminary result)   Collection Time:  03/15/20 12:13 PM   Specimen: BLOOD  Result Value Ref Range Status   Specimen Description   Final    BLOOD SITE NOT SPECIFIED Performed at [facility] [address] [place], [place] [address]    Special Requests   Final    BOTTLES DRAWN AEROBIC AND ANAEROBIC Blood Culture adequate volume Performed at [facility] [address], [place], [place] [address]    Culture   Final    NO GROWTH < 24 HOURS Performed at [facility] [address] [place], [place] [address]    Report Status PENDING  Incomplete  Culture, blood (routine x 2)     Status: None (Preliminary result)   Collection Time: 03/15/20 12:13 PM   Specimen: BLOOD  Result Value Ref Range Status   Specimen Description   Final    BLOOD SITE NOT SPECIFIED Performed at [facility] [address] [place], [place] [address]    Special Requests   Final    BOTTLES DRAWN AEROBIC AND ANAEROBIC Blood Culture adequate volume Performed at [facility] [address] [place], [place] [address]    Culture   Final    NO GROWTH < 24 HOURS Performed at [facility] [address], [place], [place], [place] [address]    Report Status PENDING  Incomplete  MRSA PCR Screening     Status: None   Collection Time: 03/15/20  9:22 PM   Specimen: Nasal Mucosa; Nasopharyngeal  Result Value Ref Range Status   MRSA by PCR NEGATIVE NEGATIVE Final    Comment:        The GeneXpert MRSA Assay (FDA approved for NASAL specimens only), is one component of a comprehensive MRSA colonization surveillance program. It is not intended to diagnose MRSA infection nor to guide or monitor treatment for MRSA infections. Performed at [facility], [place] [address] [place], [place] [address]   Gram stain     Status: None   Collection Time: 03/16/20  4:06 PM   Specimen: Fluid  Result Value Ref Range Status   Specimen Description FLUID PLEURAL  Final    Special Requests SYRINGE  Final   Gram Stain   Final    MODERATE WBC PRESENT, PREDOMINANTLY MONONUCLEAR NO ORGANISMS SEEN Performed at [facility], [place] [address] [place], [place] [address]    Report Status 03/16/2020 FINAL  Final         Radiology Studies: CT Chest High Resolution  Result Date: 03/15/2020 CLINICAL DATA:  Interstitial lung disease, shortness of breath EXAM: CT CHEST WITHOUT CONTRAST TECHNIQUE: Multidetector CT imaging of the chest was performed following the standard protocol without intravenous contrast. High resolution imaging of the lungs, as well as inspiratory and expiratory imaging, was performed. COMPARISON:  12/01/2019 FINDINGS: Cardiovascular: Aortic atherosclerosis. Dense aortic valve calcifications. Normal heart size. Extensive coronary artery calcifications and/or stents. Small pericardial effusion, unchanged compared to prior  examination. Mediastinum/Nodes: No enlarged mediastinal, hilar, or axillary lymph nodes. Thyroid gland, trachea, and esophagus demonstrate no significant findings. Lungs/Pleura: There are bilateral, left greater than right moderate pleural effusions and associated atelectasis or consolidation, significantly increased compared to prior examination. Severe centrilobular emphysema. Post treatment consolidation and fibrosis of the perihilar and infrahilar left lung, generally similar to prior examination although significantly obscured by enlarged left pleural effusion. Upper Abdomen: No acute abnormality. Musculoskeletal: No chest wall mass or suspicious bone lesions identified. Unchanged wedge deformity of the T7 vertebral body. IMPRESSION: 1. There are bilateral, left greater than right moderate pleural effusions and associated atelectasis or consolidation, significantly increased compared to prior examination. 2. Post treatment consolidation and fibrosis of the perihilar and infrahilar left lung, generally similar to prior examination  although significantly obscured by enlarged left pleural effusion. 3.  Severe emphysema.  Emphysema (ICD10-J43.9). 4. No evidence of fibrotic interstitial lung disease; the dependent bilateral lungs are not adequately assessed given the presence of pleural effusions. 5.  Coronary artery disease. 6. Dense aortic valve calcifications. Correlate with echocardiographic findings of valve function. 7.  Aortic Atherosclerosis (ICD10-I70.0). 8. Small pericardial effusion, unchanged compared to prior examination. Electronically Signed   By: Eddie Candle M.D.   On: 03/15/2020 18:56   DG Chest Port 1 View  Result Date: 03/16/2020 CLINICAL DATA:  Left thoracentesis. EXAM: PORTABLE CHEST 1 VIEW COMPARISON:  Ultrasound 03/16/2020.  Chest x-ray 03/15/2020. FINDINGS: Post thoracentesis chest x-ray reveals no evidence of pneumothorax. Interim reduction in size of left pleural effusion. Stable cardiomegaly. Stable bilateral interstitial prominence and bibasilar atelectasis. Stable small right pleural effusion. PowerPort in stable position. IMPRESSION: 1.  No evidence of pneumothorax post thoracentesis. 2. Stable bilateral interstitial prominence and bibasilar atelectasis. Stable small right pleural effusion. Electronically Signed   By: Marcello Moores  Register   On: 03/16/2020 16:13   DG Chest Port 1 View  Result Date: 03/15/2020 CLINICAL DATA:  Increasing shortness of breath for the past week. History of lung cancer and COPD. EXAM: PORTABLE CHEST 1 VIEW COMPARISON:  CT chest dated December 01, 2019. Chest x-ray dated October 08, 2019. FINDINGS: Unchanged right chest wall port catheter. The cardiac silhouette is increasingly obscured. Normal mediastinal contours. New diffuse interstitial thickening with increasing now moderate left and new small right pleural effusions. Left greater than right basilar opacities, favoring atelectasis. No pneumothorax. No acute osseous abnormality. IMPRESSION: New interstitial pulmonary edema with  increased moderate left and new small right pleural effusions. Electronically Signed   By: Titus Dubin M.D.   On: 03/15/2020 12:51   ECHOCARDIOGRAM COMPLETE  Result Date: 03/16/2020    ECHOCARDIOGRAM REPORT   Patient Name:   KATHLEEN LIKINS Date of Exam: 03/16/2020 Medical Rec #:  [id number]      Height:       68.0 in Accession #:    [id number]     Weight:       163.3 lb Date of Birth:  Apr 03, 1941      BSA:          1.875 m Patient Age:    19 years       BP:           103/57 mmHg Patient Gender: M              HR:           112 bpm. Exam Location:  Inpatient Procedure: 2D Echo, Cardiac Doppler and Color Doppler Indications:    Dyspnea  History:  Patient has prior history of Echocardiogram examinations, most                 recent 12/11/2019. CHF, COPD, Aortic Valve Disease and Mitral                 Valve Disease, Signs/Symptoms:Dyspnea and Shortness of Breath;                 Risk Factors:Hypertension. Lung cancer, chemo, radiation.  Sonographer:    Dustin Flock Referring Phys: [id number] [place]  1. Hypokinesis of the distal anteroseptal wall and apex; overall moderate to severe LV dysfunction; calcified aortic valve with probable severe AS (mean gradient 31 mmHg; AVA 1 cm2) and mild AI; mild MR; mild LAE; small pericardial effusion with RA inversion.  2. Left ventricular ejection fraction, by estimation, is 30 to 35%. The left ventricle has moderate to severely decreased function. The left ventricle has no regional wall motion abnormalities. The left ventricular internal cavity size was mildly dilated. There is mild left ventricular hypertrophy. Left ventricular diastolic parameters are indeterminate. Elevated left atrial pressure.  3. Right ventricular systolic function is normal. The right ventricular size is normal. There is mildly elevated pulmonary artery systolic pressure.  4. Left atrial size was mildly dilated.  5. The mitral valve is normal in structure. Mild mitral valve  regurgitation. No evidence of mitral stenosis.  6. The aortic valve has an indeterminant number of cusps. Aortic valve regurgitation is mild. No aortic stenosis is present. FINDINGS  Left Ventricle: Left ventricular ejection fraction, by estimation, is 30 to 35%. The left ventricle has moderate to severely decreased function. The left ventricle has no regional wall motion abnormalities. The left ventricular internal cavity size was mildly dilated. There is mild left ventricular hypertrophy. Left ventricular diastolic parameters are indeterminate. Elevated left atrial pressure. Right Ventricle: The right ventricular size is normal. Right ventricular systolic function is normal. There is mildly elevated pulmonary artery systolic pressure. The tricuspid regurgitant velocity is 2.49 m/s, and with an assumed right atrial pressure of 8 mmHg, the estimated right ventricular systolic pressure is 09.3 mmHg. Left Atrium: Left atrial size was mildly dilated. Right Atrium: Right atrial size was normal in size. Pericardium: A small pericardial effusion is present. Mitral Valve: The mitral valve is normal in structure. Normal mobility of the mitral valve leaflets. Severe mitral annular calcification. Mild mitral valve regurgitation. No evidence of mitral valve stenosis. MV peak gradient, 13.4 mmHg. The mean mitral valve gradient is 6.0 mmHg. Tricuspid Valve: The tricuspid valve is normal in structure. Tricuspid valve regurgitation is trivial. No evidence of tricuspid stenosis. Aortic Valve: The aortic valve has an indeterminant number of cusps. Aortic valve regurgitation is mild. Aortic regurgitation PHT measures 210 msec. No aortic stenosis is present. Aortic valve mean gradient measures 31.0 mmHg. Aortic valve peak gradient measures 44.6 mmHg. Aortic valve area, by VTI measures 1.06 cm. Pulmonic Valve: The pulmonic valve was normal in structure. Pulmonic valve regurgitation is not visualized. No evidence of pulmonic stenosis.  Aorta: The aortic root is normal in size and structure. Venous: The inferior vena cava was not well visualized.  Additional Comments: Hypokinesis of the distal anteroseptal wall and apex; overall moderate to severe LV dysfunction; calcified aortic valve with probable severe AS (mean gradient 31 mmHg; AVA 1 cm2) and mild AI; mild MR; mild LAE; small pericardial effusion with RA inversion.  LEFT VENTRICLE PLAX 2D LVIDd:         5.20 cm  Diastology LVIDs:         4.60 cm  LV e' lateral:   11.00 cm/s LV PW:         1.20 cm  LV E/e' lateral: 15.5 LV IVS:        1.20 cm  LV e' medial:    7.29 cm/s LVOT diam:     2.10 cm  LV E/e' medial:  23.5 LV SV:         77 LV SV Index:   41 LVOT Area:     3.46 cm  RIGHT VENTRICLE RV Basal diam:  2.50 cm RV S prime:     5.11 cm/s TAPSE (M-mode): 2.1 cm LEFT ATRIUM             Index       RIGHT ATRIUM           Index LA diam:        4.30 cm 2.29 cm/m  RA Area:     16.40 cm LA Vol (A2C):   68.8 ml 36.69 ml/m RA Volume:   47.10 ml  25.12 ml/m LA Vol (A4C):   65.0 ml 34.67 ml/m LA Biplane Vol: 67.5 ml 36.00 ml/m  AORTIC VALVE AV Area (Vmax):    1.33 cm AV Area (Vmean):   1.05 cm AV Area (VTI):     1.06 cm AV Vmax:           334.00 cm/s AV Vmean:          235.500 cm/s AV VTI:            0.725 m AV Peak Grad:      44.6 mmHg AV Mean Grad:      31.0 mmHg LVOT Vmax:         128.00 cm/s LVOT Vmean:        71.200 cm/s LVOT VTI:          0.222 m LVOT/AV VTI ratio: 0.31 AI PHT:            210 msec  AORTA Ao Root diam: 3.00 cm MITRAL VALVE                TRICUSPID VALVE MV Area (PHT): 3.37 cm     TR Peak grad:   24.8 mmHg MV Peak grad:  13.4 mmHg    TR Vmax:        249.00 cm/s MV Mean grad:  6.0 mmHg MV Vmax:       1.83 m/s     SHUNTS MV Vmean:      113.0 cm/s   Systemic VTI:  0.22 m MV Decel Time: 225 msec     Systemic Diam: 2.10 cm MV E velocity: 171.00 cm/s MV A velocity: 27.70 cm/s MV E/A ratio:  6.17 Kirk Ruths MD Electronically signed by Kirk Ruths MD Signature Date/Time:  03/16/2020/2:31:43 PM    Final    US THORACENTESIS ASP PLEURAL SPACE W/IMG GUIDE  Result Date: 03/16/2020 INDICATION: Patient history of lung cancer, COPD admitted for COPD revision on her bilateral pleural effusions presents for therapeutic and diagnostic thoracentesis EXAM: ULTRASOUND GUIDED THERAPEUTIC AND DIAGNOSTIC THORACENTESIS MEDICATIONS: Lidocaine 1% 10 mL COMPLICATIONS: None immediate. PROCEDURE: An ultrasound guided thoracentesis was thoroughly discussed with the patient and questions answered. The benefits, risks, alternatives and complications were also discussed. The patient understands and wishes to proceed with the procedure. Written consent was obtained. Ultrasound was performed to localize and mark an adequate pocket of fluid in the left-sided  chest. The area was then prepped and draped in the normal sterile fashion. 1% Lidocaine was used for local anesthesia. Under ultrasound guidance a 6 Fr Safe-T-Centesis catheter was introduced. Thoracentesis was performed. The catheter was removed and a dressing applied. FINDINGS: A total of approximately 1170 mL of straw colored fluid was removed. Samples were sent to the laboratory as requested by the clinical team. IMPRESSION: Successful ultrasound guided therapeutic and diagnostic left-sided thoracentesis yielding 1170 mL of pleural fluid. Read by Rushie Nyhan NP Electronically Signed   By: Jacqulynn Cadet M.D.   On: 03/16/2020 16:27        Scheduled Meds: . albuterol  2.5 mg Nebulization Q6H  . arformoterol  15 mcg Nebulization BID  . budesonide (PULMICORT) nebulizer solution  0.5 mg Nebulization BID  . chlorhexidine  15 mL Mouth Rinse BID  . Chlorhexidine Gluconate Cloth  6 each Topical Daily  . docusate sodium  100 mg Oral BID  . heparin injection (subcutaneous)  5,000 Units Subcutaneous Q8H  . ipratropium  0.5 mg Nebulization Q6H  . mouth rinse  15 mL Mouth Rinse q12n4p  . methylPREDNISolone (SOLU-MEDROL) injection  40 mg  Intravenous Q6H  . pantoprazole  40 mg Oral BID  . polycarbophil  625 mg Oral BID  . sodium chloride flush  10-40 mL Intracatheter Q12H  . tamsulosin  0.4 mg Oral Daily   Continuous Infusions:   LOS: 2 days    Time spent: 35 minutes    Baylin Gamblin A Benjie Ricketson, MD [facility]   If 7PM-7AM, please contact night-coverage [email or web address]  03/17/2020, 8:57 AM

## 2020-03-17 NOTE — Evaluation (Signed)
Occupational Therapy Evaluation Patient Details Name: Robert Dixon MRN: [id number] DOB: 1941/05/12 Today's Date: 03/17/2020    History of Present Illness 79 y/o male with advanced non-small cell lung cancer and aortic stenosis admitted 03/16/20 for acute on chronic repiratory failure with hypoxemia in the setting of a recurrent left sided pleural effusion. On Home oxygen 3 L.   Clinical Impression   Patient with functional deficits listed below impacting safety and independence with self care. Patient with decreased activity tolerance and safety requiring min guard to min A with functional transfers and mobility with min cues for body mechanics. Patient set up A for UB ADL and min G / min A for LB ADL. Patient expresses wanting hired assistance at home as family has limited availability to come over or have patient stay with them, instructed patient to also express this with case management.     Follow Up Recommendations  Home health OT;Supervision/Assistance - 24 hour    Equipment Recommendations  None recommended by OT(pt has all necessary DME)       Precautions / Restrictions Precautions Precautions: Fall Precaution Comments: monitor sats, HR/ BP soft Restrictions Weight Bearing Restrictions: No      Mobility Bed Mobility Overal bed mobility: Needs Assistance Bed Mobility: Supine to Sit     Supine to sit: Supervision     General bed mobility comments: for lines and safety  Transfers Overall transfer level: Needs assistance Equipment used: Rolling walker (2 wheeled) Transfers: Sit to/from Stand Sit to Stand: Min guard;Min assist         General transfer comment: steadying assist and for line management    Balance Overall balance assessment: Needs assistance Sitting-balance support: Bilateral upper extremity supported;Feet supported Sitting balance-Leahy Scale: Good     Standing balance support: Bilateral upper extremity supported;During functional  activity Standing balance-Leahy Scale: Fair                             ADL either performed or assessed with clinical judgement   ADL Overall ADL's : Needs assistance/impaired     Grooming: Set up;Sitting   Upper Body Bathing: Set up;Sitting   Lower Body Bathing: Min guard;Minimal assistance;Sit to/from stand Lower Body Bathing Details (indicate cue type and reason): for steadying assist/safety due to decreased activity tolerance Upper Body Dressing : Set up;Sitting   Lower Body Dressing: Min guard;Minimal assistance;Sit to/from stand;Sitting/lateral leans Lower Body Dressing Details (indicate cue type and reason): for steadying assist /safety due to decreased activity tolerance Toilet Transfer: Min guard;Minimal assistance;BSC;Ambulation;RW Toilet Transfer Details (indicate cue type and reason): simulated with functional mobility + transfer to recliner, cues for hand placement Toileting- Clothing Manipulation and Hygiene: Min guard;Minimal assistance;Sit to/from stand;Sitting/lateral lean       Functional mobility during ADLs: Min guard;Minimal assistance;Rolling walker;Cueing for sequencing;Cueing for safety General ADL Comments: patient requires increased assistance for self care due to decreased activity tolerance, strength, safety     Vision Baseline Vision/History: Wears glasses              Pertinent Vitals/Pain Pain Assessment: No/denies pain     Hand Dominance Left   Extremity/Trunk Assessment Upper Extremity Assessment Upper Extremity Assessment: Generalized weakness   Lower Extremity Assessment Lower Extremity Assessment: Defer to PT evaluation   Cervical / Trunk Assessment Cervical / Trunk Assessment: Kyphotic   Communication Communication Communication: No difficulties   Cognition Arousal/Alertness: Awake/alert Behavior During Therapy: WFL for tasks assessed/performed Overall Cognitive Status:  Within Functional Limits for tasks  assessed                                     General Comments  brief desaturation to 86% on 8L HFNC, cues for pursed lip breathing increase back to 88-90%            Home Living Family/patient expects to be discharged to:: Private residence Living Arrangements: Alone Available Help at Discharge: Family;Available PRN/intermittently Type of Home: House Home Access: Stairs to enter [employer] of Steps: 1 Entrance Stairs-Rails: None Home Layout: One level     Bathroom Shower/Tub: Occupational psychologist: Standard     Home Equipment: Cane - single point;Walker - 2 wheels;Wheelchair - [employer];Shower seat - built in;Grab bars - tub/shower;Toilet riser   Additional Comments: wants to hire someone 8 hours a day, on 3-4 l at home.      Prior Functioning/Environment Level of Independence: Independent        Comments: drives        OT Problem List: Decreased activity tolerance;Impaired balance (sitting and/or standing);Decreased safety awareness;Decreased knowledge of use of DME or AE;Cardiopulmonary status limiting activity      OT Treatment/Interventions: Self-care/ADL training;Therapeutic exercise;Energy conservation;DME and/or AE instruction;Therapeutic activities;Patient/family education;Balance training    OT Goals(Current goals can be found in the care plan section) Acute Rehab OT Goals Patient Stated Goal: to be able to go home, OT Goal Formulation: With patient Time For Goal Achievement: 03/31/20 Potential to Achieve Goals: Good  OT Frequency: Min 2X/week           Co-evaluation PT/OT/SLP Co-Evaluation/Treatment: Yes Reason for Co-Treatment: To address functional/ADL transfers PT goals addressed during session: Mobility/safety with mobility OT goals addressed during session: ADL's and self-care      AM-PAC OT "6 Clicks" Daily Activity     Outcome Measure Help from another person eating meals?: None Help  from another person taking care of personal grooming?: A Little Help from another person toileting, which includes using toliet, bedpan, or urinal?: A Little Help from another person bathing (including washing, rinsing, drying)?: A Little Help from another person to put on and taking off regular upper body clothing?: A Little Help from another person to put on and taking off regular lower body clothing?: A Little 6 Click Score: 19   End of Session Equipment Utilized During Treatment: Rolling walker;Oxygen Nurse Communication: Mobility status  Activity Tolerance: Patient tolerated treatment well Patient left: in chair;with call bell/phone within reach  OT Visit Diagnosis: Other abnormalities of gait and mobility (R26.89);Muscle weakness (generalized) (M62.81)                Time: 9292-4462 OT Time Calculation (min): 21 min Charges:  OT General Charges $OT Visit: 1 Visit OT Evaluation $OT Eval Moderate Complexity: 1 Mod  Delbert Phenix OT Pager: [phone]  Rosemary Holms 03/17/2020, 2:44 PM

## 2020-03-17 NOTE — Progress Notes (Addendum)
Progress Note  Patient Name: Robert Dixon Date of Encounter: 03/17/2020  Primary Cardiologist: Minus Breeding, MD   Subjective   Breathing is much better following thoracentesis yesterday. Had brief left upper chest pain following the procedure which resolved spontaneously. Again questioned recent chest pain - aside from occasional rare indigestion he denies any exertional chest pain. We discussed his echo results. He is in favor of conservative management   Inpatient Medications    Scheduled Meds: . arformoterol  15 mcg Nebulization BID  . budesonide (PULMICORT) nebulizer solution  0.5 mg Nebulization BID  . chlorhexidine  15 mL Mouth Rinse BID  . Chlorhexidine Gluconate Cloth  6 each Topical Daily  . docusate sodium  100 mg Oral BID  . heparin injection (subcutaneous)  5,000 Units Subcutaneous Q8H  . ipratropium  0.5 mg Nebulization Q6H  . mouth rinse  15 mL Mouth Rinse q12n4p  . methylPREDNISolone (SOLU-MEDROL) injection  40 mg Intravenous Q6H  . pantoprazole  40 mg Oral BID  . polycarbophil  625 mg Oral BID  . sodium chloride flush  10-40 mL Intracatheter Q12H  . tamsulosin  0.4 mg Oral Daily   Continuous Infusions:  PRN Meds: acetaminophen **OR** acetaminophen, albuterol, dimenhyDRINATE, guaiFENesin-dextromethorphan, lip balm, ondansetron **OR** ondansetron (ZOFRAN) IV, polyethylene glycol, sodium chloride flush, traMADol   Vital Signs    Vitals:   03/17/20 0400 03/17/20 0500 03/17/20 0600 03/17/20 0700  BP: 98/63 100/63 (!) 98/59 (!) 94/58  Pulse: (!) 107 (!) 102 (!) 102 97  Resp: 18 13 18 15   Temp:      TempSrc:      SpO2: 98% 95% 95% 95%  Weight:  68.7 kg      Intake/Output Summary (Last 24 hours) at 03/17/2020 0805 Last data filed at 03/17/2020 0600 Gross per 24 hour  Intake 379.98 ml  Output 2075 ml  Net -1695.02 ml   Filed Weights   03/17/20 0500  Weight: 68.7 kg    Telemetry    Sinus tachycardia with HR predominately in the 100s with  occasional PACs - Personally Reviewed  ECG    No new tracings - Personally Reviewed  Physical Exam   GEN: Sitting in bedside chair brushing his teeth in no acute distress.   Neck: No JVD, no carotid bruits Cardiac: tachycardic, regular rhythm, + systolic murmur base no rubs or gallops.  Respiratory: faint crackles at RLL, otherwise CTAB GI: NABS, Soft, nontender, non-distended  MS: tr edema; No deformity. Neuro:  Nonfocal, moving all extremities spontaneously Psych: Normal affect   Labs    Chemistry Recent Labs  Lab 03/15/20 1213 03/16/20 0236 03/17/20 0525  NA 143 144 142  K 4.1 4.5 3.9  CL 108 112* 107  CO2 22 21* 24  GLUCOSE 247* 152* 145*  BUN 26* 32* 51*  CREATININE 1.18 1.24 1.42*  CALCIUM 8.8* 8.9 8.0*  GFRNONAA 59* 55* 47*  GFRAA >60 >60 54*  ANIONGAP 13 11 11      Hematology Recent Labs  Lab 03/15/20 1213 03/16/20 0236 03/17/20 0525  WBC 11.3* 5.0 21.5*  RBC 2.87* 2.52* 2.49*  HGB 11.4* 10.2* 9.7*  HCT 35.7* 31.8* 31.1*  MCV 124.4* 126.2* 124.9*  MCH 39.7* 40.5* 39.0*  MCHC 31.9 32.1 31.2  RDW 15.8* 15.6* 15.7*  PLT 354 247 264    Cardiac EnzymesNo results for input(s): TROPONINI in the last 168 hours. No results for input(s): TROPIPOC in the last 168 hours.   BNP Recent Labs  Lab 03/15/20 1213  03/16/20 0236  BNP 1,080.5* 910.9*     DDimer No results for input(s): DDIMER in the last 168 hours.   Radiology    CT Chest High Resolution  Result Date: 03/15/2020 CLINICAL DATA:  Interstitial lung disease, shortness of breath EXAM: CT CHEST WITHOUT CONTRAST TECHNIQUE: Multidetector CT imaging of the chest was performed following the standard protocol without intravenous contrast. High resolution imaging of the lungs, as well as inspiratory and expiratory imaging, was performed. COMPARISON:  12/01/2019 FINDINGS: Cardiovascular: Aortic atherosclerosis. Dense aortic valve calcifications. Normal heart size. Extensive coronary artery calcifications  and/or stents. Small pericardial effusion, unchanged compared to prior examination. Mediastinum/Nodes: No enlarged mediastinal, hilar, or axillary lymph nodes. Thyroid gland, trachea, and esophagus demonstrate no significant findings. Lungs/Pleura: There are bilateral, left greater than right moderate pleural effusions and associated atelectasis or consolidation, significantly increased compared to prior examination. Severe centrilobular emphysema. Post treatment consolidation and fibrosis of the perihilar and infrahilar left lung, generally similar to prior examination although significantly obscured by enlarged left pleural effusion. Upper Abdomen: No acute abnormality. Musculoskeletal: No chest wall mass or suspicious bone lesions identified. Unchanged wedge deformity of the T7 vertebral body. IMPRESSION: 1. There are bilateral, left greater than right moderate pleural effusions and associated atelectasis or consolidation, significantly increased compared to prior examination. 2. Post treatment consolidation and fibrosis of the perihilar and infrahilar left lung, generally similar to prior examination although significantly obscured by enlarged left pleural effusion. 3.  Severe emphysema.  Emphysema (ICD10-J43.9). 4. No evidence of fibrotic interstitial lung disease; the dependent bilateral lungs are not adequately assessed given the presence of pleural effusions. 5.  Coronary artery disease. 6. Dense aortic valve calcifications. Correlate with echocardiographic findings of valve function. 7.  Aortic Atherosclerosis (ICD10-I70.0). 8. Small pericardial effusion, unchanged compared to prior examination. Electronically Signed   By: Eddie Candle M.D.   On: 03/15/2020 18:56   DG Chest Port 1 View  Result Date: 03/16/2020 CLINICAL DATA:  Left thoracentesis. EXAM: PORTABLE CHEST 1 VIEW COMPARISON:  Ultrasound 03/16/2020.  Chest x-ray 03/15/2020. FINDINGS: Post thoracentesis chest x-ray reveals no evidence of  pneumothorax. Interim reduction in size of left pleural effusion. Stable cardiomegaly. Stable bilateral interstitial prominence and bibasilar atelectasis. Stable small right pleural effusion. PowerPort in stable position. IMPRESSION: 1.  No evidence of pneumothorax post thoracentesis. 2. Stable bilateral interstitial prominence and bibasilar atelectasis. Stable small right pleural effusion. Electronically Signed   By: Marcello Moores  Register   On: 03/16/2020 16:13   DG Chest Port 1 View  Result Date: 03/15/2020 CLINICAL DATA:  Increasing shortness of breath for the past week. History of lung cancer and COPD. EXAM: PORTABLE CHEST 1 VIEW COMPARISON:  CT chest dated December 01, 2019. Chest x-ray dated October 08, 2019. FINDINGS: Unchanged right chest wall port catheter. The cardiac silhouette is increasingly obscured. Normal mediastinal contours. New diffuse interstitial thickening with increasing now moderate left and new small right pleural effusions. Left greater than right basilar opacities, favoring atelectasis. No pneumothorax. No acute osseous abnormality. IMPRESSION: New interstitial pulmonary edema with increased moderate left and new small right pleural effusions. Electronically Signed   By: Titus Dubin M.D.   On: 03/15/2020 12:51   ECHOCARDIOGRAM COMPLETE  Result Date: 03/16/2020    ECHOCARDIOGRAM REPORT   Patient Name:   Robert Dixon Date of Exam: 03/16/2020 Medical Rec #:  [id number]      Height:       68.0 in Accession #:    [id number]  Weight:       163.3 lb Date of Birth:  [birth year]      BSA:          1.875 m Patient Age:    22 years       BP:           103/57 mmHg Patient Gender: M              HR:           112 bpm. Exam Location:  Inpatient Procedure: 2D Echo, Cardiac Doppler and Color Doppler Indications:    Dyspnea  History:        Patient has prior history of Echocardiogram examinations, most                 recent 12/11/2019. CHF, COPD, Aortic Valve Disease and Mitral                  Valve Disease, Signs/Symptoms:Dyspnea and Shortness of Breath;                 Risk Factors:Hypertension. Lung cancer, chemo, radiation.  Sonographer:    Dustin Flock Referring Phys: [phone] [place]  1. Hypokinesis of the distal anteroseptal wall and apex; overall moderate to severe LV dysfunction; calcified aortic valve with probable severe AS (mean gradient 31 mmHg; AVA 1 cm2) and mild AI; mild MR; mild LAE; small pericardial effusion with RA inversion.  2. Left ventricular ejection fraction, by estimation, is 30 to 35%. The left ventricle has moderate to severely decreased function. The left ventricle has no regional wall motion abnormalities. The left ventricular internal cavity size was mildly dilated. There is mild left ventricular hypertrophy. Left ventricular diastolic parameters are indeterminate. Elevated left atrial pressure.  3. Right ventricular systolic function is normal. The right ventricular size is normal. There is mildly elevated pulmonary artery systolic pressure.  4. Left atrial size was mildly dilated.  5. The mitral valve is normal in structure. Mild mitral valve regurgitation. No evidence of mitral stenosis.  6. The aortic valve has an indeterminant number of cusps. Aortic valve regurgitation is mild. No aortic stenosis is present. FINDINGS  Left Ventricle: Left ventricular ejection fraction, by estimation, is 30 to 35%. The left ventricle has moderate to severely decreased function. The left ventricle has no regional wall motion abnormalities. The left ventricular internal cavity size was mildly dilated. There is mild left ventricular hypertrophy. Left ventricular diastolic parameters are indeterminate. Elevated left atrial pressure. Right Ventricle: The right ventricular size is normal. Right ventricular systolic function is normal. There is mildly elevated pulmonary artery systolic pressure. The tricuspid regurgitant velocity is 2.49 m/s, and with an assumed right  atrial pressure of 8 mmHg, the estimated right ventricular systolic pressure is 02.5 mmHg. Left Atrium: Left atrial size was mildly dilated. Right Atrium: Right atrial size was normal in size. Pericardium: A small pericardial effusion is present. Mitral Valve: The mitral valve is normal in structure. Normal mobility of the mitral valve leaflets. Severe mitral annular calcification. Mild mitral valve regurgitation. No evidence of mitral valve stenosis. MV peak gradient, 13.4 mmHg. The mean mitral valve gradient is 6.0 mmHg. Tricuspid Valve: The tricuspid valve is normal in structure. Tricuspid valve regurgitation is trivial. No evidence of tricuspid stenosis. Aortic Valve: The aortic valve has an indeterminant number of cusps. Aortic valve regurgitation is mild. Aortic regurgitation PHT measures 210 msec. No aortic stenosis is present. Aortic valve mean gradient measures 31.0 mmHg. Aortic valve  peak gradient measures 44.6 mmHg. Aortic valve area, by VTI measures 1.06 cm. Pulmonic Valve: The pulmonic valve was normal in structure. Pulmonic valve regurgitation is not visualized. No evidence of pulmonic stenosis. Aorta: The aortic root is normal in size and structure. Venous: The inferior vena cava was not well visualized.  Additional Comments: Hypokinesis of the distal anteroseptal wall and apex; overall moderate to severe LV dysfunction; calcified aortic valve with probable severe AS (mean gradient 31 mmHg; AVA 1 cm2) and mild AI; mild MR; mild LAE; small pericardial effusion with RA inversion.  LEFT VENTRICLE PLAX 2D LVIDd:         5.20 cm  Diastology LVIDs:         4.60 cm  LV e' lateral:   11.00 cm/s LV PW:         1.20 cm  LV E/e' lateral: 15.5 LV IVS:        1.20 cm  LV e' medial:    7.29 cm/s LVOT diam:     2.10 cm  LV E/e' medial:  23.5 LV SV:         77 LV SV Index:   41 LVOT Area:     3.46 cm  RIGHT VENTRICLE RV Basal diam:  2.50 cm RV S prime:     5.11 cm/s TAPSE (M-mode): 2.1 cm LEFT ATRIUM              Index       RIGHT ATRIUM           Index LA diam:        4.30 cm 2.29 cm/m  RA Area:     16.40 cm LA Vol (A2C):   68.8 ml 36.69 ml/m RA Volume:   47.10 ml  25.12 ml/m LA Vol (A4C):   65.0 ml 34.67 ml/m LA Biplane Vol: 67.5 ml 36.00 ml/m  AORTIC VALVE AV Area (Vmax):    1.33 cm AV Area (Vmean):   1.05 cm AV Area (VTI):     1.06 cm AV Vmax:           334.00 cm/s AV Vmean:          235.500 cm/s AV VTI:            0.725 m AV Peak Grad:      44.6 mmHg AV Mean Grad:      31.0 mmHg LVOT Vmax:         128.00 cm/s LVOT Vmean:        71.200 cm/s LVOT VTI:          0.222 m LVOT/AV VTI ratio: 0.31 AI PHT:            210 msec  AORTA Ao Root diam: 3.00 cm MITRAL VALVE                TRICUSPID VALVE MV Area (PHT): 3.37 cm     TR Peak grad:   24.8 mmHg MV Peak grad:  13.4 mmHg    TR Vmax:        249.00 cm/s MV Mean grad:  6.0 mmHg MV Vmax:       1.83 m/s     SHUNTS MV Vmean:      113.0 cm/s   Systemic VTI:  0.22 m MV Decel Time: 225 msec     Systemic Diam: 2.10 cm MV E velocity: 171.00 cm/s MV A velocity: 27.70 cm/s MV E/A ratio:  6.17 Kirk Ruths MD Electronically signed by Aaron Edelman  Crenshaw MD Signature Date/Time: 03/16/2020/2:31:43 PM    Final    US THORACENTESIS ASP PLEURAL SPACE W/IMG GUIDE  Result Date: 03/16/2020 INDICATION: Patient history of lung cancer, COPD admitted for COPD revision on her bilateral pleural effusions presents for therapeutic and diagnostic thoracentesis EXAM: ULTRASOUND GUIDED THERAPEUTIC AND DIAGNOSTIC THORACENTESIS MEDICATIONS: Lidocaine 1% 10 mL COMPLICATIONS: None immediate. PROCEDURE: An ultrasound guided thoracentesis was thoroughly discussed with the patient and questions answered. The benefits, risks, alternatives and complications were also discussed. The patient understands and wishes to proceed with the procedure. Written consent was obtained. Ultrasound was performed to localize and mark an adequate pocket of fluid in the left-sided chest. The area was then prepped and draped in  the normal sterile fashion. 1% Lidocaine was used for local anesthesia. Under ultrasound guidance a 6 Fr Safe-T-Centesis catheter was introduced. Thoracentesis was performed. The catheter was removed and a dressing applied. FINDINGS: A total of approximately 1170 mL of straw colored fluid was removed. Samples were sent to the laboratory as requested by the clinical team. IMPRESSION: Successful ultrasound guided therapeutic and diagnostic left-sided thoracentesis yielding 1170 mL of pleural fluid. Read by Rushie Nyhan NP Electronically Signed   By: Jacqulynn Cadet M.D.   On: 03/16/2020 16:27    Cardiac Studies   Echocardiogram 03/16/2020: 1. Hypokinesis of the distal anteroseptal wall and apex; overall moderate  to severe LV dysfunction; calcified aortic valve with probable severe AS  (mean gradient 31 mmHg; AVA 1 cm2) and mild AI; mild MR; mild LAE; small  pericardial effusion with RA  inversion.  2. Left ventricular ejection fraction, by estimation, is 30 to 35%. The  left ventricle has moderate to severely decreased function. The left  ventricle has no regional wall motion abnormalities. The left ventricular  internal cavity size was mildly  dilated. There is mild left ventricular hypertrophy. Left ventricular  diastolic parameters are indeterminate. Elevated left atrial pressure.  3. Right ventricular systolic function is normal. The right ventricular  size is normal. There is mildly elevated pulmonary artery systolic  pressure.  4. Left atrial size was mildly dilated.  5. The mitral valve is normal in structure. Mild mitral valve  regurgitation. No evidence of mitral stenosis.  6. The aortic valve has an indeterminant number of cusps. Aortic valve  regurgitation is mild. No aortic stenosis is present.   Patient Profile      79 y.o. 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 Acute systolic  CHF: Echo yesterday showed LVEF is now severely depressed with some walls moving better than others  On review of images myself I think LVEF is probably lower than reported 30 to 35%   This is different from echo in Jan 2021   May be due to severe AS with LV decompensation    May also represent CAD though pt has had no CP but does have atherosclerosis on CT scan  Will review with Oncology but given lung cancer I do not feel pt would be a good candidate for invasive evaluation/Rx of coronary arteries and valve   I reviewed echo images with patient and patients son, discussed pathophsysioloyy and told them that I would talk to Dr Julien Nordmann For now would keep on current regimen  It would be good to get a little better control of HR  But this may represent cardiac decompensation.   I would not start inotropes    - Will start aspirin 81mg  daily for possible ischemic component given coronary artery calcifications on CT. - Strict I/O   Agree with holding diuresis and reassessing renal function in am   Agree with reassessing in AM and diuresing as able    2 Acute on chronic hypoxic respiratory failure: likely contributed to by CHF with new LV dysfunction (echo above), severe AS  COPD exacerbation in setting of underlying lung cancer . BNP elevated to 1080 and trending down with IV lasix. He has bilateral moderate (L>R) pleural effusions Fluid was transudative yesterday's thoracentesis     Continune current Rx  CCM following   3. Elevated troponins: HsTrop trend: 85>127>134. Relatively low flat trend is not c/w ACS.Most likely due to CHF exacerbation Appears to have new TWI in I and V2; TWI in aVL seen on 11/2019 EKG.  He has evidence of extensive coronary artery calcifications on high resolution CT chest this admission.    4. Severe aortic stenosis: mean gradient 31 on last echo  03/16/2020, as well as new LV systolic dysfunction which portends a worse prognosis. Per discussion with Dr. Percival Spanish 01/2020, it was felt that TAVR was not likely to improve his quality of life.   5. HTN: not on medications outpatient. BP intermittently soft.  - Continue to monitor    For questions or updates, please contact [place] Please consult [email or web address] for contact info under Cardiology/STEMI.      Signed, Abigail Butts, PA-C  03/17/2020, 8:05 AM   [phone]   Patient seen and examined  I agreee with findings as noted above by Teodoro Kil above I have amended note to reflect my findings (particularly impression above) On exam, pt is more comfortable but still with signs of increased volume  HR 100s (ST)  SBP 80s to 110s  Lungs with rales at R base  MOving air bliaterlly  Cardiac exam   RRR  Tachy   III/VI systolic murmur Abd is benign Ext with Tr edema  Plan as noted above   Will reassess in AM  Dorris Carnes MD

## 2020-03-17 NOTE — Evaluation (Signed)
Physical Therapy Evaluation Patient Details Name: Robert Dixon MRN: [id number] DOB: 07-21-1941 Today's Date: 03/17/2020   History of Present Illness  79 y/o male with advanced non-small cell lung cancer and aortic stenosis admitted 03/16/20 for acute on chronic repiratory failure with hypoxemia in the setting of a recurrent left sided pleural effusion. On Home oxygen 3 L.  Clinical Impression  The patient received on 8 l HFNC with resting SPO2 92%, BP 94/58, sitting BP 94/59. Patient ambulated a very short distance of 6 '. Will attempt farther distance next visit. Spo2 86% with mobility.  Patient reports that he would like to hire  Someone for 8 hours a day as family not available. Defer to case manager.Pt admitted with above diagnosis.  Pt currently with functional limitations due to the deficits listed below (see PT Problem List). Pt will benefit from skilled PT to increase their independence and safety with mobility to allow discharge to the venue listed below.       Follow Up Recommendations Home health PT;Supervision/Assistance - 24 hour    Equipment Recommendations  None recommended by PT    Recommendations for Other Services       Precautions / Restrictions Precautions Precautions: Fall Precaution Comments: monitor sats, HR/ BP soft      Mobility  Bed Mobility Overal bed mobility: Needs Assistance Bed Mobility: Supine to Sit     Supine to sit: Supervision     General bed mobility comments: for lines and safety  Transfers Overall transfer level: Needs assistance Equipment used: Rolling walker (2 wheeled) Transfers: Sit to/from Stand Sit to Stand: Min guard;Min assist         General transfer comment: steady assist, gude for lines  Ambulation/Gait Ambulation/Gait assistance: Min Web designer (Feet): 6 Feet Assistive device: Rolling walker (2 wheeled) Gait Pattern/deviations: Step-to pattern;Step-through pattern Gait velocity: decr   General Gait  Details: only short distance, on 8 L HFNC, SPO2 865 from 92%, HR 97, -106,  Stairs            Wheelchair Mobility    Modified Rankin (Stroke Patients Only)       Balance Overall balance assessment: Needs assistance Sitting-balance support: Bilateral upper extremity supported;Feet supported Sitting balance-Leahy Scale: Good     Standing balance support: Bilateral upper extremity supported;During functional activity Standing balance-Leahy Scale: Fair                               Pertinent Vitals/Pain Pain Assessment: No/denies pain    Home Living Family/patient expects to be discharged to:: Private residence Living Arrangements: Alone;Other relatives Available Help at Discharge: Family;Available PRN/intermittently Type of Home: House Home Access: Stairs to enter Entrance Stairs-Rails: None Entrance Stairs-Number of Steps: 1   Home Equipment: Cane - single point;Walker - 2 wheels;Wheelchair - [employer];Shower seat - built in;Grab bars - tub/shower;Toilet riser Additional Comments: wants to hire someone 8 hours a day, on 3-4 l at home.    Prior Function Level of Independence: Independent         Comments: drives     Hand Dominance   Dominant Hand: Left    Extremity/Trunk Assessment        Lower Extremity Assessment Lower Extremity Assessment: Generalized weakness    Cervical / Trunk Assessment Cervical / Trunk Assessment: Kyphotic  Communication   Communication: No difficulties  Cognition Arousal/Alertness: Awake/alert Behavior During Therapy: WFL for tasks assessed/performed Overall Cognitive Status: Within Functional  Limits for tasks assessed                                        General Comments      Exercises     Assessment/Plan    PT Assessment Patient needs continued PT services  PT Problem List Decreased strength;Decreased knowledge of precautions;Decreased mobility;Decreased knowledge of  use of DME;Cardiopulmonary status limiting activity;Decreased activity tolerance;Decreased safety awareness       PT Treatment Interventions DME instruction;Functional mobility training;Gait training;Therapeutic activities;Therapeutic exercise    PT Goals (Current goals can be found in the Care Plan section)  Acute Rehab PT Goals Patient Stated Goal: to be able to go home, PT Goal Formulation: With patient Time For Goal Achievement: 03/31/20 Potential to Achieve Goals: Good    Frequency Min 3X/week   Barriers to discharge Decreased caregiver support states family not available 24/7    Co-evaluation PT/OT/SLP Co-Evaluation/Treatment: Yes Reason for Co-Treatment: For patient/therapist safety PT goals addressed during session: Mobility/safety with mobility         AM-PAC PT "6 Clicks" Mobility  Outcome Measure Help needed turning from your back to your side while in a flat bed without using bedrails?: None Help needed moving from lying on your back to sitting on the side of a flat bed without using bedrails?: None Help needed moving to and from a bed to a chair (including a wheelchair)?: A Little Help needed standing up from a chair using your arms (e.g., wheelchair or bedside chair)?: A Little Help needed to walk in hospital room?: A Lot Help needed climbing 3-5 steps with a railing? : A Lot 6 Click Score: 18    End of Session Equipment Utilized During Treatment: Gait belt;Oxygen Activity Tolerance: Patient tolerated treatment well Patient left: in chair;with call bell/phone within reach;with chair alarm set Nurse Communication: Mobility status PT Visit Diagnosis: Unsteadiness on feet (R26.81);Difficulty in walking, not elsewhere classified (R26.2)    Time: 8979-1504 PT Time Calculation (min) (ACUTE ONLY): 21 min   Charges:   PT Evaluation $PT Eval Low Complexity: [place] PT Acute Rehabilitation Services Pager [phone] Office  [phone]   Claretha Cooper 03/17/2020, 1:05 PM

## 2020-03-18 DIAGNOSIS — I5021 Acute systolic (congestive) heart failure: Secondary | ICD-10-CM

## 2020-03-18 LAB — BASIC METABOLIC PANEL
Anion gap: 9 (ref 5–15)
BUN: 51 mg/dL — ABNORMAL HIGH (ref 8–23)
CO2: 24 mmol/L (ref 22–32)
Calcium: 8.2 mg/dL — ABNORMAL LOW (ref 8.9–10.3)
Chloride: 109 mmol/L (ref 98–111)
Creatinine, Ser: 1.33 mg/dL — ABNORMAL HIGH (ref 0.61–1.24)
GFR calc Af Amer: 59 mL/min — ABNORMAL LOW (ref >60)
GFR calc non Af Amer: 51 mL/min — ABNORMAL LOW (ref >60)
Glucose, Bld: 178 mg/dL — ABNORMAL HIGH (ref 70–99)
Potassium: 4 mmol/L (ref 3.5–5.1)
Sodium: 142 mmol/L (ref 135–145)

## 2020-03-18 LAB — CBC
HCT: 31.5 % — ABNORMAL LOW (ref 39.0–52.0)
Hemoglobin: 10.2 g/dL — ABNORMAL LOW (ref 13.0–17.0)
MCH: 40.5 pg — ABNORMAL HIGH (ref 26.0–34.0)
MCHC: 32.4 g/dL (ref 30.0–36.0)
MCV: 125 fL — ABNORMAL HIGH (ref 80.0–100.0)
Platelets: 224 10*3/uL (ref 150–400)
RBC: 2.52 MIL/uL — ABNORMAL LOW (ref 4.22–5.81)
RDW: 15.4 % (ref 11.5–15.5)
WBC: 16.7 10*3/uL — ABNORMAL HIGH (ref 4.0–10.5)
nRBC: 0 % (ref 0.0–0.2)

## 2020-03-18 LAB — CYTOLOGY - NON PAP

## 2020-03-18 MED ORDER — FUROSEMIDE 10 MG/ML IJ SOLN
80.0000 mg | Freq: Once | INTRAMUSCULAR | Status: AC
  Administered 2020-03-18: 80 mg via INTRAVENOUS
  Filled 2020-03-18: qty 8

## 2020-03-18 NOTE — Progress Notes (Signed)
Progress Note  Patient Name: Robert Dixon Date of Encounter: 03/18/2020  Primary Cardiologist: Minus Breeding, MD   Subjective   Patient breathing OK in bed   Denies CP    Inpatient Medications    Scheduled Meds: . arformoterol  15 mcg Nebulization BID  . aspirin  81 mg Oral Daily  . budesonide (PULMICORT) nebulizer solution  0.5 mg Nebulization BID  . chlorhexidine  15 mL Mouth Rinse BID  . Chlorhexidine Gluconate Cloth  6 each Topical Daily  . docusate sodium  100 mg Oral BID  . heparin injection (subcutaneous)  5,000 Units Subcutaneous Q8H  . ipratropium-albuterol  3 mL Nebulization Q6H  . mouth rinse  15 mL Mouth Rinse q12n4p  . methylPREDNISolone (SOLU-MEDROL) injection  40 mg Intravenous Q6H  . pantoprazole  40 mg Oral BID  . polycarbophil  625 mg Oral BID  . sodium chloride flush  10-40 mL Intracatheter Q12H  . tamsulosin  0.4 mg Oral Daily   Continuous Infusions:  PRN Meds: acetaminophen **OR** acetaminophen, albuterol, alum & mag hydroxide-simeth, dimenhyDRINATE, guaiFENesin-dextromethorphan, lip balm, ondansetron **OR** ondansetron (ZOFRAN) IV, polyethylene glycol, sodium chloride flush, traMADol   Vital Signs    Vitals:   03/17/20 2336 03/18/20 0149 03/18/20 0341 03/18/20 0753  BP: (!) 92/58  106/68   Pulse: 100  (!) 108   Resp: 18  18   Temp: (!) 97.5 F (36.4 C)  97.7 F (36.5 C)   TempSrc: Oral  Oral   SpO2: 93% 94% 94% 93%  Weight:      Height:        Intake/Output Summary (Last 24 hours) at 03/18/2020 1058 Last data filed at 03/18/2020 1000 Gross per 24 hour  Intake 420 ml  Output 600 ml  Net -180 ml   Filed Weights   03/17/20 0500 03/17/20 0836  Weight: 68.7 kg 68.7 kg    Telemetry    SR/ST   Personally Reviewed  ECG    No new tracings - Personally Reviewed  Physical Exam   GEN: Sitting in bedside chair brushing his teeth in no acute distress.   Neck: No JVD, no carotid bruits Cardiac: REgular rate and rhythm Gr III/VI  systolic murmur LSB to bas   Respiratory: faint crackles at RLL, otherwise CTAB GI: NABS, Soft, nontender, non-distended  MS: tr edema; No deformity. Neuro:  Nonfocal, moving all extremities spontaneously Psych: Normal affect   Labs    Chemistry Recent Labs  Lab 03/16/20 0236 03/17/20 0525 03/18/20 0539  NA 144 142 142  K 4.5 3.9 4.0  CL 112* 107 109  CO2 21* 24 24  GLUCOSE 152* 145* 178*  BUN 32* 51* 51*  CREATININE 1.24 1.42* 1.33*  CALCIUM 8.9 8.0* 8.2*  GFRNONAA 55* 47* 51*  GFRAA >60 54* 59*  ANIONGAP 11 11 9      Hematology Recent Labs  Lab 03/16/20 0236 03/17/20 0525 03/18/20 0539  WBC 5.0 21.5* 16.7*  RBC 2.52* 2.49* 2.52*  HGB 10.2* 9.7* 10.2*  HCT 31.8* 31.1* 31.5*  MCV 126.2* 124.9* 125.0*  MCH 40.5* 39.0* 40.5*  MCHC 32.1 31.2 32.4  RDW 15.6* 15.7* 15.4  PLT 247 264 224    Cardiac EnzymesNo results for input(s): TROPONINI in the last 168 hours. No results for input(s): TROPIPOC in the last 168 hours.   BNP Recent Labs  Lab 03/15/20 1213 03/16/20 0236  BNP 1,080.5* 910.9*     DDimer No results for input(s): DDIMER in the last 168 hours.  Radiology    DG Chest Port 1 View  Result Date: 03/16/2020 CLINICAL DATA:  Left thoracentesis. EXAM: PORTABLE CHEST 1 VIEW COMPARISON:  Ultrasound 03/16/2020.  Chest x-ray 03/15/2020. FINDINGS: Post thoracentesis chest x-ray reveals no evidence of pneumothorax. Interim reduction in size of left pleural effusion. Stable cardiomegaly. Stable bilateral interstitial prominence and bibasilar atelectasis. Stable small right pleural effusion. PowerPort in stable position. IMPRESSION: 1.  No evidence of pneumothorax post thoracentesis. 2. Stable bilateral interstitial prominence and bibasilar atelectasis. Stable small right pleural effusion. Electronically Signed   By: Marcello Moores  Register   On: 03/16/2020 16:13   ECHOCARDIOGRAM COMPLETE  Result Date: 03/16/2020    ECHOCARDIOGRAM REPORT   Patient Name:   Robert Dixon  Date of Exam: 03/16/2020 Medical Rec #:  [id number]      Height:       68.0 in Accession #:    [id number]     Weight:       163.3 lb Date of Birth:  06/23/1941      BSA:          1.875 m Patient Age:    79 years       BP:           103/57 mmHg Patient Gender: M              HR:           112 bpm. Exam Location:  Inpatient Procedure: 2D Echo, Cardiac Doppler and Color Doppler Indications:    Dyspnea  History:        Patient has prior history of Echocardiogram examinations, most                 recent 12/11/2019. CHF, COPD, Aortic Valve Disease and Mitral                 Valve Disease, Signs/Symptoms:Dyspnea and Shortness of Breath;                 Risk Factors:Hypertension. Lung cancer, chemo, radiation.  Sonographer:    Dustin Flock Referring Phys: [phone] [place]  1. Hypokinesis of the distal anteroseptal wall and apex; overall moderate to severe LV dysfunction; calcified aortic valve with probable severe AS (mean gradient 31 mmHg; AVA 1 cm2) and mild AI; mild MR; mild LAE; small pericardial effusion with RA inversion.  2. Left ventricular ejection fraction, by estimation, is 30 to 35%. The left ventricle has moderate to severely decreased function. The left ventricle has no regional wall motion abnormalities. The left ventricular internal cavity size was mildly dilated. There is mild left ventricular hypertrophy. Left ventricular diastolic parameters are indeterminate. Elevated left atrial pressure.  3. Right ventricular systolic function is normal. The right ventricular size is normal. There is mildly elevated pulmonary artery systolic pressure.  4. Left atrial size was mildly dilated.  5. The mitral valve is normal in structure. Mild mitral valve regurgitation. No evidence of mitral stenosis.  6. The aortic valve has an indeterminant number of cusps. Aortic valve regurgitation is mild. No aortic stenosis is present. FINDINGS  Left Ventricle: Left ventricular ejection fraction, by estimation,  is 30 to 35%. The left ventricle has moderate to severely decreased function. The left ventricle has no regional wall motion abnormalities. The left ventricular internal cavity size was mildly dilated. There is mild left ventricular hypertrophy. Left ventricular diastolic parameters are indeterminate. Elevated left atrial pressure. Right Ventricle: The right ventricular size is normal. Right ventricular systolic function is  normal. There is mildly elevated pulmonary artery systolic pressure. The tricuspid regurgitant velocity is 2.49 m/s, and with an assumed right atrial pressure of 8 mmHg, the estimated right ventricular systolic pressure is 02.4 mmHg. Left Atrium: Left atrial size was mildly dilated. Right Atrium: Right atrial size was normal in size. Pericardium: A small pericardial effusion is present. Mitral Valve: The mitral valve is normal in structure. Normal mobility of the mitral valve leaflets. Severe mitral annular calcification. Mild mitral valve regurgitation. No evidence of mitral valve stenosis. MV peak gradient, 13.4 mmHg. The mean mitral valve gradient is 6.0 mmHg. Tricuspid Valve: The tricuspid valve is normal in structure. Tricuspid valve regurgitation is trivial. No evidence of tricuspid stenosis. Aortic Valve: The aortic valve has an indeterminant number of cusps. Aortic valve regurgitation is mild. Aortic regurgitation PHT measures 210 msec. No aortic stenosis is present. Aortic valve mean gradient measures 31.0 mmHg. Aortic valve peak gradient measures 44.6 mmHg. Aortic valve area, by VTI measures 1.06 cm. Pulmonic Valve: The pulmonic valve was normal in structure. Pulmonic valve regurgitation is not visualized. No evidence of pulmonic stenosis. Aorta: The aortic root is normal in size and structure. Venous: The inferior vena cava was not well visualized.  Additional Comments: Hypokinesis of the distal anteroseptal wall and apex; overall moderate to severe LV dysfunction; calcified aortic  valve with probable severe AS (mean gradient 31 mmHg; AVA 1 cm2) and mild AI; mild MR; mild LAE; small pericardial effusion with RA inversion.  LEFT VENTRICLE PLAX 2D LVIDd:         5.20 cm  Diastology LVIDs:         4.60 cm  LV e' lateral:   11.00 cm/s LV PW:         1.20 cm  LV E/e' lateral: 15.5 LV IVS:        1.20 cm  LV e' medial:    7.29 cm/s LVOT diam:     2.10 cm  LV E/e' medial:  23.5 LV SV:         77 LV SV Index:   41 LVOT Area:     3.46 cm  RIGHT VENTRICLE RV Basal diam:  2.50 cm RV S prime:     5.11 cm/s TAPSE (M-mode): 2.1 cm LEFT ATRIUM             Index       RIGHT ATRIUM           Index LA diam:        4.30 cm 2.29 cm/m  RA Area:     16.40 cm LA Vol (A2C):   68.8 ml 36.69 ml/m RA Volume:   47.10 ml  25.12 ml/m LA Vol (A4C):   65.0 ml 34.67 ml/m LA Biplane Vol: 67.5 ml 36.00 ml/m  AORTIC VALVE AV Area (Vmax):    1.33 cm AV Area (Vmean):   1.05 cm AV Area (VTI):     1.06 cm AV Vmax:           334.00 cm/s AV Vmean:          235.500 cm/s AV VTI:            0.725 m AV Peak Grad:      44.6 mmHg AV Mean Grad:      31.0 mmHg LVOT Vmax:         128.00 cm/s LVOT Vmean:        71.200 cm/s LVOT VTI:  0.222 m LVOT/AV VTI ratio: 0.31 AI PHT:            210 msec  AORTA Ao Root diam: 3.00 cm MITRAL VALVE                TRICUSPID VALVE MV Area (PHT): 3.37 cm     TR Peak grad:   24.8 mmHg MV Peak grad:  13.4 mmHg    TR Vmax:        249.00 cm/s MV Mean grad:  6.0 mmHg MV Vmax:       1.83 m/s     SHUNTS MV Vmean:      113.0 cm/s   Systemic VTI:  0.22 m MV Decel Time: 225 msec     Systemic Diam: 2.10 cm MV E velocity: 171.00 cm/s MV A velocity: 27.70 cm/s MV E/A ratio:  6.17 Kirk Ruths MD Electronically signed by Kirk Ruths MD Signature Date/Time: 03/16/2020/2:31:43 PM    Final    US THORACENTESIS ASP PLEURAL SPACE W/IMG GUIDE  Result Date: 03/16/2020 INDICATION: Patient history of lung cancer, COPD admitted for COPD revision on her bilateral pleural effusions presents for therapeutic and  diagnostic thoracentesis EXAM: ULTRASOUND GUIDED THERAPEUTIC AND DIAGNOSTIC THORACENTESIS MEDICATIONS: Lidocaine 1% 10 mL COMPLICATIONS: None immediate. PROCEDURE: An ultrasound guided thoracentesis was thoroughly discussed with the patient and questions answered. The benefits, risks, alternatives and complications were also discussed. The patient understands and wishes to proceed with the procedure. Written consent was obtained. Ultrasound was performed to localize and mark an adequate pocket of fluid in the left-sided chest. The area was then prepped and draped in the normal sterile fashion. 1% Lidocaine was used for local anesthesia. Under ultrasound guidance a 6 Fr Safe-T-Centesis catheter was introduced. Thoracentesis was performed. The catheter was removed and a dressing applied. FINDINGS: A total of approximately 1170 mL of straw colored fluid was removed. Samples were sent to the laboratory as requested by the clinical team. IMPRESSION: Successful ultrasound guided therapeutic and diagnostic left-sided thoracentesis yielding 1170 mL of pleural fluid. Read by Rushie Nyhan NP Electronically Signed   By: Jacqulynn Cadet M.D.   On: 03/16/2020 16:27    Cardiac Studies   Echocardiogram 03/16/2020: 1. Hypokinesis of the distal anteroseptal wall and apex; overall moderate  to severe LV dysfunction; calcified aortic valve with probable severe AS  (mean gradient 31 mmHg; AVA 1 cm2) and mild AI; mild MR; mild LAE; small  pericardial effusion with RA  inversion.  2. Left ventricular ejection fraction, by estimation, is 30 to 35%. The  left ventricle has moderate to severely decreased function. The left  ventricle has no regional wall motion abnormalities. The left ventricular  internal cavity size was mildly  dilated. There is mild left ventricular hypertrophy. Left ventricular  diastolic parameters are indeterminate. Elevated left atrial pressure.  3. Right ventricular systolic function is  normal. The right ventricular  size is normal. There is mildly elevated pulmonary artery systolic  pressure.  4. Left atrial size was mildly dilated.  5. The mitral valve is normal in structure. Mild mitral valve  regurgitation. No evidence of mitral stenosis.  6. The aortic valve has an indeterminant number of cusps. Aortic valve  regurgitation is mild. No aortic stenosis is present.   Patient Profile      79 y.o. 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 Acute systolic  CHF: Echo yesterday showed LVEF is now severely depressed with some walls moving better than others  On review of images myself I think LVEF is probably lower than reported 30 to 35%   This is different from echo in Jan 2021   May be due to severe AS with LV decompensation    May also represent CAD though pt has had no CP but does have atherosclerosis on CT scan   VOlume is improved from admit but still up some   WOUld hold diuretics for now   BUN/Cr stable    Need to review with Pulm and ONc re further caree / WOrk up     2 Acute on chronic hypoxic respiratory failure: likely contributed to by CHF with new LV dysfunction (echo above), severe AS  COPD exacerbation in setting of underlying lung cancer . BNP elevated to 1080 and trending down with IV lasix. He has bilateral moderate (L>R) pleural effusions Fluid was transudative yesterday's thoracentesis     Continune current Rx  CCM following   3. Elevated troponins: HsTrop trend: 85>127>134. Relatively low flat trend is not c/w ACS.Most likely due to CHF exacerbation Appears to have new TWI in I and V2; TWI in aVL seen on 11/2019 EKG.  He has evidence of extensive coronary artery calcifications on high resolution CT chest this admission.  NOt convinced of active  ischemia   4. Severe aortic stenosis: mean gradient 31 on last echo 03/16/2020, as well as new LV systolic dysfunction which portends a worse prognosis. Need to review with oncology and pulmonary.    5. HTN: BP is soft at tiems     6 Lpids    LDL OK at 64      For questions or updates, please contact [place] Please consult [email or web address] for contact info under Cardiology/STEMI.      Signed, Dorris Carnes, MD  03/18/2020, 10:58 AM   [phone]   Patient seen and examined  I agreee with findings as noted above by Teodoro Kil above I have amended note to reflect my findings (particularly impression above) On exam, pt is more comfortable but still with signs of increased volume  HR 100s (ST)  SBP 80s to 110s  Lungs with rales at R base  MOving air bliaterlly  Cardiac exam   RRR  Tachy   III/VI systolic murmur Abd is benign Ext with Tr edema  Plan as noted above   Will reassess in AM  Dorris Carnes MD

## 2020-03-18 NOTE — Progress Notes (Addendum)
NAME:  Robert Dixon, MRN:  [id number], DOB:  [birth year], LOS: 3 ADMISSION DATE:  03/15/2020, CONSULTATION DATE:  4/19 REFERRING MD:  Gilford Raid, CHIEF COMPLAINT:  Dyspnea  Brief History   79 y/o male with advanced non-small cell lung cancer and aortic stenosis admitted for acute on chronic repiratory failure with hypoxemia in the setting of a recurrent left sided pleural effusion.   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 Severe aortic stenosis Left pleural effusion COPD CHF Chronic respiratory failure with hypoxemia Pericardial effusion GERD Hiatal hernia HTN Migraine headache  Significant Hospital Events     Consults:  PCCM  Procedures:  4/20 left thoracentesis > 1100 cc fluid removed  Significant Diagnostic Tests:  CXR 4/19 > new interstitial pulm edema ESR 4/19 94 PCT 4/19 0.13 CT Chest High Resolution 4/19 > emphysema bilaterally, left lung mass, bilateral effusion L>R  Micro Data:  COVID 4/19 >> negative  Influenza A/B 4/19 >> negative  Blood culture 4/19 >> NGTD Left pleural fluid 4/2 >>   Antimicrobials:  Azithromycin 4/19 >> 4/20  Interim history/subjective:  No acute events overnight, states he feels well  Objective   Blood pressure 106/68, pulse (!) 108, temperature 97.7 F (36.5 C), temperature source Oral, resp. rate 18, height '5\' 8"'  (1.727 m), weight 68.7 kg, SpO2 93 %.        Intake/Output Summary (Last 24 hours) at 03/18/2020 1028 Last data filed at 03/18/2020 0600 Gross per 24 hour  Intake 420 ml  Output 450 ml  Net -30 ml   Filed Weights   03/17/20 0500 03/17/20 0836  Weight: 68.7 kg 68.7 kg    Examination: General: Chronically ill appearing elderly male lying in bed, in NAD HEENT: [place]/AT, MM pink/moist, PERRL,  Neuro: Alert and oriented x3, non-focal CV: s1s2 regular rate and rhythm, no murmur, rubs, or gallops,  PULM:  Diminished bilaterally, no increased work of breathing, no added  breath sounds  GI: soft, bowel sounds active in all 4 quadrants, non-tender, non-distended Extremities: warm/dry, no edema  Skin: no rashes or lesions  Resolved Hospital Problem list     Assessment & Plan:  Acute on chronic respiratory failure with hypoxemia -Suspect with 10 pound weight gain in last month with transudative fluid this is likely fluid retention related to his cardiac disease Left sided pleural effusion -Transudative based on pleural fluid to serum albumin gradient > 1.0 gm/dL Stage IIIb non-small cell lung cancer Severe aortic stenosis P: Continue to diurese as volume status and kidneys will allow  Wean supplement oxygen as able  Pleural cytology remains pending, will continue to follow  Spo2 goal > 88 Cardiology following  Encourage pulmonary hygiene   Best practice:  Diet: NPO while on BiPAP Pain/Anxiety/Delirium protocol (if indicated): n/a VAP protocol (if indicated): n/a  DVT prophylaxis: per primary  GI prophylaxis: per primary  Glucose control: per primary  Mobility: As tolerated  Code Status: DNR / DNI  Family Communication: Patient at bedside Disposition: SDU, per [facility]   Labs   CBC: Recent Labs  Lab 03/15/20 1213 03/16/20 0236 03/17/20 0525 03/18/20 0539  WBC 11.3* 5.0 21.5* 16.7*  NEUTROABS 9.8*  --   --   --   HGB 11.4* 10.2* 9.7* 10.2*  HCT 35.7* 31.8* 31.1* 31.5*  MCV 124.4* 126.2* 124.9* 125.0*  PLT 354 247 264 053    Basic Metabolic Panel: Recent Labs  Lab 03/15/20 1213 03/16/20 0236 03/17/20 0525 03/18/20 0539  NA 143  144 142 142  K 4.1 4.5 3.9 4.0  CL 108 112* 107 109  CO2 22 21* 24 24  GLUCOSE 247* 152* 145* 178*  BUN 26* 32* 51* 51*  CREATININE 1.18 1.24 1.42* 1.33*  CALCIUM 8.8* 8.9 8.0* 8.2*   GFR: Estimated Creatinine Clearance: 44.3 mL/min (A) (by C-G formula based on SCr of 1.33 mg/dL (H)). Recent Labs  Lab 03/15/20 1213 03/15/20 1537 03/15/20 1949 03/16/20 0236 03/17/20 0525 03/18/20 0539  PROCALCITON   --   --  0.13 <0.10 <0.10  --   WBC 11.3*  --   --  5.0 21.5* 16.7*  LATICACIDVEN 1.8 1.5  --   --   --   --     Liver Function Tests: No results for input(s): AST, ALT, ALKPHOS, BILITOT, PROT, ALBUMIN in the last 168 hours. No results for input(s): LIPASE, AMYLASE in the last 168 hours. No results for input(s): AMMONIA in the last 168 hours.  ABG    Component Value Date/Time   PHART 7.405 03/15/2020 1411   PCO2ART 33.9 03/15/2020 1411   PO2ART 69.7 (L) 03/15/2020 1411   HCO3 20.8 03/15/2020 1411   TCO2 17.0 04/06/2013 0003   ACIDBASEDEF 2.8 (H) 03/15/2020 1411   O2SAT 92.4 03/15/2020 1411     Coagulation Profile: No results for input(s): INR, PROTIME in the last 168 hours.  Cardiac Enzymes: No results for input(s): CKTOTAL, CKMB, CKMBINDEX, TROPONINI in the last 168 hours.  HbA1C: Hgb A1c MFr Bld  Date/Time Value Ref Range Status  03/17/2020 05:26 AM 4.3 (L) 4.8 - 5.6 % Final    Comment:    (NOTE) Pre diabetes:          5.7%-6.4% Diabetes:              >6.4% Glycemic control for   <7.0% adults with diabetes     CBG: No results for input(s): GLUCAP in the last 168 hours.   Signature:   Johnsie Cancel, NP-C [facility] Contact / Pager information can be found on Amion  03/18/2020, 10:39 AM      Pleural fluid with reactive mesothelial cells. Agree with assessment & plan by Merlene Laughter, NP.  Julian Hy, DO 03/18/20 10:06 PM  Pulmonary & Critical Care

## 2020-03-18 NOTE — Progress Notes (Addendum)
HEMATOLOGY-ONCOLOGY PROGRESS NOTE  SUBJECTIVE: Reports that he is feeling better.  Still with some shortness of breath but no other specific complaints today.  Oncology History  Non-small cell carcinoma of left lung, stage 3 ([place])  12/19/2018 Initial Diagnosis   Non-small cell carcinoma of left lung, stage 3 ([place])   12/19/2018 Cancer Staging   Staging form: Lung, AJCC 8th Edition - Clinical: Stage IIIB (cT1b, cN3, cM0) - Signed by [name], MD on 12/19/2018   12/31/2018 - 02/09/2019 Chemotherapy   The patient had palonosetron (ALOXI) injection 0.25 mg, 0.25 mg, Intravenous,  Once, 5 of 7 cycles Administration: 0.25 mg (12/31/2018), 0.25 mg (01/27/2019), 0.25 mg (01/07/2019), 0.25 mg (01/13/2019), 0.25 mg (01/20/2019) CARBOplatin (PARAPLATIN) 190 mg in sodium chloride 0.9 % 250 mL chemo infusion, 190 mg (100 % of original dose 190.2 mg), Intravenous,  Once, 5 of 7 cycles Dose modification: 190.2 mg (original dose 190.2 mg, Cycle 1) Administration: 190 mg (12/31/2018), 190 mg (01/27/2019), 190 mg (01/07/2019), 190 mg (01/13/2019), 190 mg (01/20/2019) PACLitaxel (TAXOL) 90 mg in sodium chloride 0.9 % 250 mL chemo infusion (</= 80mg /m2), 45 mg/m2 = 90 mg, Intravenous,  Once, 5 of 7 cycles Administration: 90 mg (12/31/2018), 90 mg (01/27/2019), 90 mg (01/07/2019), 90 mg (01/13/2019), 90 mg (01/20/2019)  for chemotherapy treatment.    03/25/2019 -  Chemotherapy   The patient had durvalumab (IMFINZI) 740 mg in sodium chloride 0.9 % 100 mL chemo infusion, 9.6 mg/kg = 780 mg, Intravenous,  Once, 26 of 26 cycles Dose modification: 9.24 mg/kg (original dose 10 mg/kg, Cycle 22, Reason: Other (see comments), Comment: Rounded to 720mg  per insurance mandate), 720 mg (original dose 10 mg/kg, Cycle 22, Reason: Other (see comments), Comment: insurance mandate for dose rounding) Administration: 740 mg (03/25/2019), 740 mg (06/17/2019), 740 mg (07/01/2019), 740 mg (07/15/2019), 740 mg (07/29/2019), 740 mg (08/12/2019), 740 mg  (04/08/2019), 740 mg (08/26/2019), 740 mg (04/22/2019), 740 mg (05/06/2019), 740 mg (05/20/2019), 740 mg (06/03/2019), 740 mg (09/09/2019), 740 mg (09/23/2019), 740 mg (10/08/2019), 740 mg (10/21/2019), 740 mg (11/04/2019), 740 mg (11/18/2019), 740 mg (12/02/2019), 740 mg (12/16/2019), 740 mg (12/30/2019), 740 mg (01/13/2020), 720 mg (02/10/2020), 720 mg (02/24/2020), 720 mg (03/09/2020), 720 mg (01/27/2020)  for chemotherapy treatment.       REVIEW OF SYSTEMS:   Constitutional: Denies fevers, chills or abnormal weight loss Respiratory: Shortness of breath which has improved. Cardiovascular: Denies palpitation, chest discomfort Gastrointestinal:  Denies nausea, heartburn or change in bowel habits Skin: Denies abnormal skin rashes Lymphatics: Denies new lymphadenopathy or easy bruising Neurological:Denies numbness, tingling or new weaknesses Behavioral/Psych: Mood is stable, no new changes  Extremities: No lower extremity edema All other systems were reviewed with the patient and are negative.  I have reviewed the past medical history, past surgical history, social history and family history with the patient and they are unchanged from previous note.   PHYSICAL EXAMINATION: ECOG PERFORMANCE STATUS: 2 - Symptomatic, <50% confined to bed  Vitals:   03/18/20 0341 03/18/20 0753  BP: 106/68   Pulse: (!) 108   Resp: 18   Temp: 97.7 F (36.5 C)   SpO2: 94% 93%   Filed Weights   03/17/20 0500 03/17/20 0836  Weight: 68.7 kg 68.7 kg    Intake/Output from previous day: 04/21 0701 - 04/22 0700 In: 430 [P.O.:420; I.V.:10] Out: 650 [Urine:650]  GENERAL:alert, no distress and comfortable LUNGS: Diminished bilaterally HEART: regular rate & rhythm and no murmurs and no lower extremity edema ABDOMEN:abdomen soft, non-tender and normal  bowel sounds Musculoskeletal:no cyanosis of digits and no clubbing  NEURO: alert & oriented x 3 with fluent speech, no focal motor/sensory deficits  LABORATORY DATA:  I  have reviewed the data as listed CMP Latest Ref Rng & Units 03/18/2020 03/17/2020 03/16/2020  Glucose 70 - 99 mg/dL 178(H) 145(H) 152(H)  BUN 8 - 23 mg/dL 51(H) 51(H) 32(H)  Creatinine 0.61 - 1.24 mg/dL 1.33(H) 1.42(H) 1.24  Sodium 135 - 145 mmol/L 142 142 144  Potassium 3.5 - 5.1 mmol/L 4.0 3.9 4.5  Chloride 98 - 111 mmol/L 109 107 112(H)  CO2 22 - 32 mmol/L 24 24 21(L)  Calcium 8.9 - 10.3 mg/dL 8.2(L) 8.0(L) 8.9  Total Protein 6.5 - 8.1 g/dL - - -  Total Bilirubin 0.3 - 1.2 mg/dL - - -  Alkaline Phos 38 - 126 U/L - - -  AST 15 - 41 U/L - - -  ALT 0 - 44 U/L - - -    Lab Results  Component Value Date   WBC 16.7 (H) 03/18/2020   HGB 10.2 (L) 03/18/2020   HCT 31.5 (L) 03/18/2020   MCV 125.0 (H) 03/18/2020   PLT 224 03/18/2020   NEUTROABS 9.8 (H) 03/15/2020    CT Chest High Resolution  Result Date: 03/15/2020 CLINICAL DATA:  Interstitial lung disease, shortness of breath EXAM: CT CHEST WITHOUT CONTRAST TECHNIQUE: Multidetector CT imaging of the chest was performed following the standard protocol without intravenous contrast. High resolution imaging of the lungs, as well as inspiratory and expiratory imaging, was performed. COMPARISON:  12/01/2019 FINDINGS: Cardiovascular: Aortic atherosclerosis. Dense aortic valve calcifications. Normal heart size. Extensive coronary artery calcifications and/or stents. Small pericardial effusion, unchanged compared to prior examination. Mediastinum/Nodes: No enlarged mediastinal, hilar, or axillary lymph nodes. Thyroid gland, trachea, and esophagus demonstrate no significant findings. Lungs/Pleura: There are bilateral, left greater than right moderate pleural effusions and associated atelectasis or consolidation, significantly increased compared to prior examination. Severe centrilobular emphysema. Post treatment consolidation and fibrosis of the perihilar and infrahilar left lung, generally similar to prior examination although significantly obscured by  enlarged left pleural effusion. Upper Abdomen: No acute abnormality. Musculoskeletal: No chest wall mass or suspicious bone lesions identified. Unchanged wedge deformity of the T7 vertebral body. IMPRESSION: 1. There are bilateral, left greater than right moderate pleural effusions and associated atelectasis or consolidation, significantly increased compared to prior examination. 2. Post treatment consolidation and fibrosis of the perihilar and infrahilar left lung, generally similar to prior examination although significantly obscured by enlarged left pleural effusion. 3.  Severe emphysema.  Emphysema (ICD10-J43.9). 4. No evidence of fibrotic interstitial lung disease; the dependent bilateral lungs are not adequately assessed given the presence of pleural effusions. 5.  Coronary artery disease. 6. Dense aortic valve calcifications. Correlate with echocardiographic findings of valve function. 7.  Aortic Atherosclerosis (ICD10-I70.0). 8. Small pericardial effusion, unchanged compared to prior examination. Electronically Signed   By: Eddie Candle M.D.   On: 03/15/2020 18:56   DG Chest Port 1 View  Result Date: 03/16/2020 CLINICAL DATA:  Left thoracentesis. EXAM: PORTABLE CHEST 1 VIEW COMPARISON:  Ultrasound 03/16/2020.  Chest x-ray 03/15/2020. FINDINGS: Post thoracentesis chest x-ray reveals no evidence of pneumothorax. Interim reduction in size of left pleural effusion. Stable cardiomegaly. Stable bilateral interstitial prominence and bibasilar atelectasis. Stable small right pleural effusion. PowerPort in stable position. IMPRESSION: 1.  No evidence of pneumothorax post thoracentesis. 2. Stable bilateral interstitial prominence and bibasilar atelectasis. Stable small right pleural effusion. Electronically Signed   By: Marcello Moores  Register   On: 03/16/2020 16:13   DG Chest Port 1 View  Result Date: 03/15/2020 CLINICAL DATA:  Increasing shortness of breath for the past week. History of lung cancer and COPD. EXAM:  PORTABLE CHEST 1 VIEW COMPARISON:  CT chest dated December 01, 2019. Chest x-ray dated October 08, 2019. FINDINGS: Unchanged right chest wall port catheter. The cardiac silhouette is increasingly obscured. Normal mediastinal contours. New diffuse interstitial thickening with increasing now moderate left and new small right pleural effusions. Left greater than right basilar opacities, favoring atelectasis. No pneumothorax. No acute osseous abnormality. IMPRESSION: New interstitial pulmonary edema with increased moderate left and new small right pleural effusions. Electronically Signed   By: Titus Dubin M.D.   On: 03/15/2020 12:51   ECHOCARDIOGRAM COMPLETE  Result Date: 03/16/2020    ECHOCARDIOGRAM REPORT   Patient Name:   KOBYN KRAY Date of Exam: 03/16/2020 Medical Rec #:  [id number]      Height:       68.0 in Accession #:    [id number]     Weight:       163.3 lb Date of Birth:  28-Sep-1941      BSA:          1.875 m Patient Age:    37 years       BP:           103/57 mmHg Patient Gender: M              HR:           112 bpm. Exam Location:  Inpatient Procedure: 2D Echo, Cardiac Doppler and Color Doppler Indications:    Dyspnea  History:        Patient has prior history of Echocardiogram examinations, most                 recent 12/11/2019. CHF, COPD, Aortic Valve Disease and Mitral                 Valve Disease, Signs/Symptoms:Dyspnea and Shortness of Breath;                 Risk Factors:Hypertension. Lung cancer, chemo, radiation.  Sonographer:    Dustin Flock Referring Phys: [phone] [place]  1. Hypokinesis of the distal anteroseptal wall and apex; overall moderate to severe LV dysfunction; calcified aortic valve with probable severe AS (mean gradient 31 mmHg; AVA 1 cm2) and mild AI; mild MR; mild LAE; small pericardial effusion with RA inversion.  2. Left ventricular ejection fraction, by estimation, is 30 to 35%. The left ventricle has moderate to severely decreased function. The  left ventricle has no regional wall motion abnormalities. The left ventricular internal cavity size was mildly dilated. There is mild left ventricular hypertrophy. Left ventricular diastolic parameters are indeterminate. Elevated left atrial pressure.  3. Right ventricular systolic function is normal. The right ventricular size is normal. There is mildly elevated pulmonary artery systolic pressure.  4. Left atrial size was mildly dilated.  5. The mitral valve is normal in structure. Mild mitral valve regurgitation. No evidence of mitral stenosis.  6. The aortic valve has an indeterminant number of cusps. Aortic valve regurgitation is mild. No aortic stenosis is present. FINDINGS  Left Ventricle: Left ventricular ejection fraction, by estimation, is 30 to 35%. The left ventricle has moderate to severely decreased function. The left ventricle has no regional wall motion abnormalities. The left ventricular internal cavity size was mildly dilated. There is mild left  ventricular hypertrophy. Left ventricular diastolic parameters are indeterminate. Elevated left atrial pressure. Right Ventricle: The right ventricular size is normal. Right ventricular systolic function is normal. There is mildly elevated pulmonary artery systolic pressure. The tricuspid regurgitant velocity is 2.49 m/s, and with an assumed right atrial pressure of 8 mmHg, the estimated right ventricular systolic pressure is 28.3 mmHg. Left Atrium: Left atrial size was mildly dilated. Right Atrium: Right atrial size was normal in size. Pericardium: A small pericardial effusion is present. Mitral Valve: The mitral valve is normal in structure. Normal mobility of the mitral valve leaflets. Severe mitral annular calcification. Mild mitral valve regurgitation. No evidence of mitral valve stenosis. MV peak gradient, 13.4 mmHg. The mean mitral valve gradient is 6.0 mmHg. Tricuspid Valve: The tricuspid valve is normal in structure. Tricuspid valve regurgitation is  trivial. No evidence of tricuspid stenosis. Aortic Valve: The aortic valve has an indeterminant number of cusps. Aortic valve regurgitation is mild. Aortic regurgitation PHT measures 210 msec. No aortic stenosis is present. Aortic valve mean gradient measures 31.0 mmHg. Aortic valve peak gradient measures 44.6 mmHg. Aortic valve area, by VTI measures 1.06 cm. Pulmonic Valve: The pulmonic valve was normal in structure. Pulmonic valve regurgitation is not visualized. No evidence of pulmonic stenosis. Aorta: The aortic root is normal in size and structure. Venous: The inferior vena cava was not well visualized.  Additional Comments: Hypokinesis of the distal anteroseptal wall and apex; overall moderate to severe LV dysfunction; calcified aortic valve with probable severe AS (mean gradient 31 mmHg; AVA 1 cm2) and mild AI; mild MR; mild LAE; small pericardial effusion with RA inversion.  LEFT VENTRICLE PLAX 2D LVIDd:         5.20 cm  Diastology LVIDs:         4.60 cm  LV e' lateral:   11.00 cm/s LV PW:         1.20 cm  LV E/e' lateral: 15.5 LV IVS:        1.20 cm  LV e' medial:    7.29 cm/s LVOT diam:     2.10 cm  LV E/e' medial:  23.5 LV SV:         77 LV SV Index:   41 LVOT Area:     3.46 cm  RIGHT VENTRICLE RV Basal diam:  2.50 cm RV S prime:     5.11 cm/s TAPSE (M-mode): 2.1 cm LEFT ATRIUM             Index       RIGHT ATRIUM           Index LA diam:        4.30 cm 2.29 cm/m  RA Area:     16.40 cm LA Vol (A2C):   68.8 ml 36.69 ml/m RA Volume:   47.10 ml  25.12 ml/m LA Vol (A4C):   65.0 ml 34.67 ml/m LA Biplane Vol: 67.5 ml 36.00 ml/m  AORTIC VALVE AV Area (Vmax):    1.33 cm AV Area (Vmean):   1.05 cm AV Area (VTI):     1.06 cm AV Vmax:           334.00 cm/s AV Vmean:          235.500 cm/s AV VTI:            0.725 m AV Peak Grad:      44.6 mmHg AV Mean Grad:      31.0 mmHg LVOT Vmax:  128.00 cm/s LVOT Vmean:        71.200 cm/s LVOT VTI:          0.222 m LVOT/AV VTI ratio: 0.31 AI PHT:            210  msec  AORTA Ao Root diam: 3.00 cm MITRAL VALVE                TRICUSPID VALVE MV Area (PHT): 3.37 cm     TR Peak grad:   24.8 mmHg MV Peak grad:  13.4 mmHg    TR Vmax:        249.00 cm/s MV Mean grad:  6.0 mmHg MV Vmax:       1.83 m/s     SHUNTS MV Vmean:      113.0 cm/s   Systemic VTI:  0.22 m MV Decel Time: 225 msec     Systemic Diam: 2.10 cm MV E velocity: 171.00 cm/s MV A velocity: 27.70 cm/s MV E/A ratio:  6.17 Kirk Ruths MD Electronically signed by Kirk Ruths MD Signature Date/Time: 03/16/2020/2:31:43 PM    Final    US THORACENTESIS ASP PLEURAL SPACE W/IMG GUIDE  Result Date: 03/16/2020 INDICATION: Patient history of lung cancer, COPD admitted for COPD revision on her bilateral pleural effusions presents for therapeutic and diagnostic thoracentesis EXAM: ULTRASOUND GUIDED THERAPEUTIC AND DIAGNOSTIC THORACENTESIS MEDICATIONS: Lidocaine 1% 10 mL COMPLICATIONS: None immediate. PROCEDURE: An ultrasound guided thoracentesis was thoroughly discussed with the patient and questions answered. The benefits, risks, alternatives and complications were also discussed. The patient understands and wishes to proceed with the procedure. Written consent was obtained. Ultrasound was performed to localize and mark an adequate pocket of fluid in the left-sided chest. The area was then prepped and draped in the normal sterile fashion. 1% Lidocaine was used for local anesthesia. Under ultrasound guidance a 6 Fr Safe-T-Centesis catheter was introduced. Thoracentesis was performed. The catheter was removed and a dressing applied. FINDINGS: A total of approximately 1170 mL of straw colored fluid was removed. Samples were sent to the laboratory as requested by the clinical team. IMPRESSION: Successful ultrasound guided therapeutic and diagnostic left-sided thoracentesis yielding 1170 mL of pleural fluid. Read by Rushie Nyhan NP Electronically Signed   By: Jacqulynn Cadet M.D.   On: 03/16/2020 16:27    ASSESSMENT  AND PLAN: This is a pleasant 79 year old Caucasian male diagnosed stage IIIb's non-small cell lung cancer, adenocarcinoma the left lower lobe in January 2020.  He has no actionable mutations.  The patient received concurrent chemoradiation with carboplatin for an AUC of 2 and paclitaxel 45 mg meter squared x5 cycles.  Cycle #6 was held secondary to thrombocytopenia.  He has now receiving consolidation immunotherapy with Imfinzi 10 mg/kg IV every 2 weeks.  He is status post 26 cycles.  He has been tolerating his treatment well without any adverse effects.  He is now admitted for acute on chronic respiratory failure with hypoxemia.  Status post left thoracentesis.  Cytology currently pending.  We will follow up on cytology results when they are available to [place].  We will see him back in our office as previously scheduled on 03/29/2020.   LOS: 3 days   Mikey Bussing, DNP, AGPCNP-BC, AOCNP 03/18/20   ADDENDUM: Hematology/Oncology Attending: I had a face-to-face encounter with the patient today.  I recommended his care plan and agree with the above note.  This is a very pleasant 79 years old white male with stage IIIb non-small cell lung cancer, adenocarcinoma diagnosed  in January 2020.  He is status post a course of concurrent chemoradiation with weekly carboplatin and paclitaxel with no evidence for disease progression.  The patient completed a course of consolidation treatment with immunotherapy with Imfinzi.  He is currently on observation and feeling fine.  He was admitted to the hospital with respiratory failure secondary to his COPD and aortic stenosis.  He was also found to have left-sided pleural effusion and the patient underwent ultrasound-guided thoracentesis with drainage of 1170 mL of pleural fluid.  The cytology showed reactive mesothelial cells and no malignant cells present. I think his respiratory failure is secondary to the severe aortic stenosis as well as a history of COPD.  The patient  may benefit from cardiac intervention for the AS.  He probably will not tolerate any big surgical procedure because of his underlying COPD. The patient is interested in proceeding with any intervention that can help his aortic stenosis and congestive heart failure. Regarding his lung cancer, he is currently on observation and we will continue to monitor him closely for now. Thank you for taking good care of Mr. Canupp, I will continue to follow up the patient with you and assist in his management on as-needed basis.  Disclaimer: This note was dictated with voice recognition software. Similar sounding words can inadvertently be transcribed and may be missed upon review.  Eilleen Kempf, MD

## 2020-03-18 NOTE — Progress Notes (Signed)
Physical Therapy Treatment Patient Details Name: Robert Dixon MRN: [id number] DOB: 09/01/1941 Today's Date: 03/18/2020    History of Present Illness 79 y/o male with advanced non-small cell lung cancer and aortic stenosis admitted 03/16/20 for acute on chronic repiratory failure with hypoxemia in the setting of a recurrent left sided pleural effusion. On Home oxygen 3 L.    PT Comments    Instructed pt in bed level LE strengthening exercises. Pt performed ankle pumps and isometric quadriceps and gluteal exercises. He could only tolerate 3 repetitions each for the isometric exercises 2* dyspnea. Pt sat at edge of bed x 6 minutes. Sit to stand and 1 lateral step with min/guard assist. SaO2 83% on 5L HFNC with transfers, HR 110. Pt fatigues quickly with minimal activity. He puts forth good effort.    Follow Up Recommendations  Home health PT; vs. SNF; Supervision/Assistance - 24 hour; supervision for mobility     Equipment Recommendations  None recommended by PT    Recommendations for Other Services       Precautions / Restrictions Precautions Precautions: Fall Precaution Comments: monitor sats, HR/ BP soft Restrictions Weight Bearing Restrictions: No    Mobility  Bed Mobility Overal bed mobility: Needs Assistance Bed Mobility: Supine to Sit     Supine to sit: Supervision     General bed mobility comments: for lines and safety  Transfers Overall transfer level: Needs assistance Equipment used: None Transfers: Sit to/from Stand Sit to Stand: Min guard         General transfer comment: pt came to 3/4 stand, took one side step at edge of bed, then sat down, activity tolerance limited by 3/4 dyspnea, SaO2 83% on 5L HFNC after sit to stand  Ambulation/Gait             General Gait Details: unable 2* dyspnea/hypoxia with minimal activity   Stairs             Wheelchair Mobility    Modified Rankin (Stroke Patients Only)       Balance Overall  balance assessment: Needs assistance Sitting-balance support: Bilateral upper extremity supported;Feet supported Sitting balance-Leahy Scale: Good     Standing balance support: Bilateral upper extremity supported;During functional activity Standing balance-Leahy Scale: Fair                              Cognition Arousal/Alertness: Awake/alert Behavior During Therapy: WFL for tasks assessed/performed Overall Cognitive Status: Within Functional Limits for tasks assessed                                        Exercises General Exercises - Lower Extremity Ankle Circles/Pumps: AROM;Both;10 reps;Supine Quad Sets: AROM;Both;Other reps (comment)(3 reps) Gluteal Sets: AROM;Both;Other reps (comment)(3 reps)    General Comments        Pertinent Vitals/Pain Pain Assessment: No/denies pain    Home Living                      Prior Function            PT Goals (current goals can now be found in the care plan section) Acute Rehab PT Goals Patient Stated Goal: to be able to go home, PT Goal Formulation: With patient Time For Goal Achievement: 03/31/20 Potential to Achieve Goals: Good Progress towards PT goals: Progressing toward goals  Frequency    Min 3X/week      PT Plan Current plan remains appropriate    Co-evaluation PT/OT/SLP Co-Evaluation/Treatment: Yes            AM-PAC PT "6 Clicks" Mobility   Outcome Measure  Help needed turning from your back to your side while in a flat bed without using bedrails?: None Help needed moving from lying on your back to sitting on the side of a flat bed without using bedrails?: None Help needed moving to and from a bed to a chair (including a wheelchair)?: A Little Help needed standing up from a chair using your arms (e.g., wheelchair or bedside chair)?: A Little Help needed to walk in hospital room?: A Lot Help needed climbing 3-5 steps with a railing? : A Lot 6 Click Score: 18     End of Session Equipment Utilized During Treatment: Oxygen Activity Tolerance: Patient limited by fatigue;Treatment limited secondary to medical complications (Comment)(hypoxia and dyspnea with activity) Patient left: with call bell/phone within reach;in bed;with bed alarm set Nurse Communication: Mobility status PT Visit Diagnosis: Unsteadiness on feet (R26.81);Difficulty in walking, not elsewhere classified (R26.2)     Time: 2831-5176 PT Time Calculation (min) (ACUTE ONLY): 22 min  Charges:  $Therapeutic Activity: 8-22 mins                     Blondell Reveal Kistler PT 03/18/2020  Acute Rehabilitation Services Pager [phone] Office [phone]

## 2020-03-18 NOTE — [place] FL2 (Addendum)
[place] LEVEL OF CARE SCREENING TOOL     IDENTIFICATION  Patient Name: Robert Dixon Birthdate: 1941-04-14 Sex: male Admission Date (Current Location): 03/15/2020  [facility] and [place] Number:  Herbalist and Address:  [facility],  [place] [address], [place]      Provider Number: [id number]  Attending Physician Name and Address:  Elmarie Shiley, MD  Relative Name and Phone Number:  [name] (son) [phone]    Current Level of Care: Hospital Recommended Level of Care: [place] Prior Approval Number:    Date Approved/Denied:   PASRR Number:  [id number]  Discharge Plan: SNF    Current Diagnoses: Patient Active Problem List   Diagnosis Date Noted  . Acute on chronic respiratory failure with hypoxia ([place]) 03/16/2020  . COPD exacerbation ([place]) 03/15/2020  . Nonrheumatic aortic valve stenosis 10/14/2019  . Pericardial effusion 10/14/2019  . Urinary hesitancy 09/23/2019  . Pleural effusion 09/16/2019  . Port-A-Cath in place 04/22/2019  . Chronic respiratory failure with hypoxia ([place]) 04/15/2019  . Centrilobular emphysema ([place]) 04/14/2019  . Encounter for antineoplastic immunotherapy 03/03/2019  . Antineoplastic chemotherapy induced anemia 02/03/2019  . Non-small cell carcinoma of left lung, stage 3 ([place]) 12/19/2018  . Goals of care, counseling/discussion 12/19/2018  . Encounter for antineoplastic chemotherapy 12/19/2018  . Primary malignant neoplasm of left upper lobe of lung ([place]) 12/12/2018  . Hilar mass 12/06/2018  . Mediastinal adenopathy 12/06/2018  . Conjunctivitis unspecified 04/10/2013  . Hypokalemia 04/09/2013  . Acute respiratory failure ([place]) 04/09/2013  . ARF (acute renal failure) ([place]) 04/05/2013  . CAP (community acquired pneumonia) 04/04/2013  . Leukocytosis, unspecified 04/04/2013  . HTN (hypertension) 04/04/2013    Orientation RESPIRATION BLADDER Height & Weight     Self, Time,  Situation, Place  O2 Continent Weight: 68.7 kg Height:  5\' 8"  (172.7 cm)  BEHAVIORAL SYMPTOMS/MOOD NEUROLOGICAL BOWEL NUTRITION STATUS      Continent Diet(Heart Healthy)  AMBULATORY STATUS COMMUNICATION OF NEEDS Skin   Limited Assist Verbally                         Personal Care Assistance Level of Assistance  Bathing, Dressing Bathing Assistance: Limited assistance   Dressing Assistance: Limited assistance     Functional Limitations Info  Sight Sight Info: Impaired        SPECIAL CARE FACTORS FREQUENCY  PT (By licensed PT), OT (By licensed OT)     PT Frequency: 5 x weekly OT Frequency: 5 x weekly            Contractures Contractures Info: Not present    Additional Factors Info  Code Status, Allergies Code Status Info: DNR Allergies Info: No Known Drug Allergies           Current Medications (03/18/2020):  This is the current hospital active medication list Current Facility-Administered Medications  Medication Dose Route Frequency Provider Last Rate Last Admin  . acetaminophen (TYLENOL) tablet 650 mg  650 mg Oral Q6H PRN Hollice Gong, Mir Earlie Server, MD       Or  . acetaminophen (TYLENOL) suppository 650 mg  650 mg Rectal Q6H PRN Hollice Gong, Mir Mohammed, MD      . albuterol (PROVENTIL) (2.5 MG/3ML) 0.083% nebulizer solution 2.5 mg  2.5 mg Nebulization Q2H PRN Hollice Gong, Mir Mohammed, MD      . alum & mag hydroxide-simeth (MAALOX/MYLANTA) 200-200-20 MG/5ML suspension 15 mL  15 mL Oral Q6H PRN Regalado, Belkys A,  MD      . arformoterol (BROVANA) nebulizer solution 15 mcg  15 mcg Nebulization BID Noe Gens L, NP   15 mcg at 03/18/20 0751  . aspirin chewable tablet 81 mg  81 mg Oral Daily Roby Lofts M., PA-C   81 mg at 03/18/20 1021  . budesonide (PULMICORT) nebulizer solution 0.5 mg  0.5 mg Nebulization BID Ollis, Brandi L, NP   0.5 mg at 03/18/20 0750  . chlorhexidine (PERIDEX) 0.12 % solution 15 mL  15 mL Mouth Rinse BID Hollice Gong, Mir Mohammed, MD    15 mL at 03/18/20 1020  . Chlorhexidine Gluconate Cloth 2 % PADS 6 each  6 each Topical Daily Tomma Rakers, MD   6 each at 03/17/20 [phone]  . dimenhyDRINATE (DRAMAMINE) tablet 50 mg  50 mg Oral Q8H PRN Hollice Gong, Mir Mohammed, MD      . docusate sodium (COLACE) capsule 100 mg  100 mg Oral BID Mariel Aloe, MD   100 mg at 03/18/20 1022  . guaiFENesin-dextromethorphan (ROBITUSSIN DM) 100-10 MG/5ML syrup 5 mL  5 mL Oral Q4H PRN Mariel Aloe, MD   5 mL at 03/16/20 1719  . heparin injection 5,000 Units  5,000 Units Subcutaneous Q8H Fay Records, MD   5,000 Units at 03/18/20 0528  . ipratropium-albuterol (DUONEB) 0.5-2.5 (3) MG/3ML nebulizer solution 3 mL  3 mL Nebulization Q6H Regalado, Belkys A, MD   3 mL at 03/18/20 1316  . lip balm (CARMEX) ointment   Topical PRN Tomma Rakers, MD      . MEDLINE mouth rinse  15 mL Mouth Rinse q12n4p Hollice Gong, Mir Mohammed, MD   15 mL at 03/16/20 1710  . methylPREDNISolone sodium succinate (SOLU-MEDROL) 40 mg/mL injection 40 mg  40 mg Intravenous Q6H Hollice Gong, Mir Mohammed, MD   40 mg at 03/18/20 1021  . ondansetron (ZOFRAN) tablet 4 mg  4 mg Oral Q6H PRN Hollice Gong, Mir Mohammed, MD       Or  . ondansetron [facility]) injection 4 mg  4 mg Intravenous Q6H PRN Hollice Gong, Mir Mohammed, MD      . pantoprazole (PROTONIX) EC tablet 40 mg  40 mg Oral BID Tomma Rakers, MD   40 mg at 03/18/20 1022  . polycarbophil (FIBERCON) tablet 625 mg  625 mg Oral BID Tomma Rakers, MD   625 mg at 03/17/20 2151  . polyethylene glycol (MIRALAX / GLYCOLAX) packet 17 g  17 g Oral Daily PRN Mariel Aloe, MD      . sodium chloride flush (NS) 0.9 % injection 10-40 mL  10-40 mL Intracatheter Q12H Mariel Aloe, MD   10 mL at 03/17/20 2137  . sodium chloride flush (NS) 0.9 % injection 10-40 mL  10-40 mL Intracatheter PRN Mariel Aloe, MD      . tamsulosin (FLOMAX) capsule 0.4 mg  0.4 mg Oral Daily Hollice Gong, Mir Mohammed, MD   0.4 mg  at 03/18/20 1022  . traMADol (ULTRAM) tablet 50 mg  50 mg Oral BID PRN Tomma Rakers, MD   50 mg at 03/16/20 1648   Facility-Administered Medications Ordered in Other Encounters  Medication Dose Route Frequency Provider Last Rate Last Admin  . sodium chloride flush (NS) 0.9 % injection 10 mL  10 mL Intracatheter PRN [name], MD      . sodium chloride flush (NS) 0.9 % injection 10 mL  10 mL Intracatheter PRN [name], MD   10 mL at 05/20/19 1232  Discharge Medications: Please see discharge summary for a list of discharge medications.  Relevant Imaging Results:  Relevant Lab Results:   Additional Information [id number]  Chyler Creely, Marjie Skiff, RN

## 2020-03-18 NOTE — Progress Notes (Addendum)
PROGRESS NOTE    Robert Dixon  PRF:[id number] DOB: 03/12/1941 DOA: 03/15/2020 PCP: [name], MD   Brief Narrative: Robert Dixon is a 79 y.o. malewith medical history significant forlung cancer, COPD on chronic 3 L oxygen, aortic stenosis, pericardial effusion. Patient presented secondary to dyspnea with concern for a COPD exacerbation. On admission he required BiPAP and was given IV lasix for noted pleural effusions   Assessment & Plan:   Principal Problem:   Acute on chronic respiratory failure with hypoxia (HCC) Active Problems:   Non-small cell carcinoma of left lung, stage 3 (HCC)   Pleural effusion   Nonrheumatic aortic valve stenosis   Pericardial effusion   COPD exacerbation (HCC)   1-Chronic respiratory failure with hypoxemia Secondary to COPD, complicated by underlying lung cancer, pleural effusion and heart failure exacerbation. Required BiPAP initially Underwent thoracentesis 4-20 yielding 1.1 L fluid.  Reactive mesothelia cell.  Further  diuretic per cardiology.  2-left side pleural effusion, transudative: Follow cytology reactive mesothelia cell.  Underwent thoracentesis on 4/20   3-stage IIIb non-small lung cancer: With Dr. Jenetta Loges aortic stenosis systolic heart failure exacerbation: Conservative management for aortic valve. Diuretics per cardiology.  Discussed with Dr Harrington Challenger will proceed with 80 mg IV lasix now  5-COPD exacerbation: Continue with Pulmicort [place] and scheduled nebulizers Solu-Medrol 6-Leukocytosis: Suspect related to her steroid. Estimated body mass index is 23.03 kg/m as calculated from the following:   Height as of this encounter: 5\' 8"  (1.727 m).   Weight as of this encounter: 68.7 kg.   DVT prophylaxis: Heparin Code Status: DNR Family Communication: Care discussed directly with patient Disposition Plan:  Patient is from: Home Anticipated d/c date: 2 or 3 days Barriers to d/c or necessity for inpatient status:  When respiratory status improved, and cardiology cleared patient for discharge  Consultants:   Cardiology  Pulmonology  Procedures:     Antimicrobials:    Subjective: Feeling same. Still requiring 5 L oxygen  Objective: Vitals:   03/18/20 0341 03/18/20 0753 03/18/20 1201 03/18/20 1206  BP: 106/68   105/60  Pulse: (!) 108   (!) 105  Resp: 18   18  Temp: 97.7 F (36.5 C)   97.8 F (36.6 C)  TempSrc: Oral   Oral  SpO2: 94% 93% (!) 83% 95%  Weight:      Height:        Intake/Output Summary (Last 24 hours) at 03/18/2020 1658 Last data filed at 03/18/2020 1000 Gross per 24 hour  Intake 420 ml  Output 600 ml  Net -180 ml   Filed Weights   03/17/20 0500 03/17/20 0836  Weight: 68.7 kg 68.7 kg    Examination:  General exam: NAD Respiratory system: B/L crackles.  Cardiovascular system: S 1, S 2  Gastrointestinal system: BS present, soft, nt Central nervous system: Non focal.  Extremities: Symmetric power    Data Reviewed: I have personally reviewed following labs and imaging studies  CBC: Recent Labs  Lab 03/15/20 1213 03/16/20 0236 03/17/20 0525 03/18/20 0539  WBC 11.3* 5.0 21.5* 16.7*  NEUTROABS 9.8*  --   --   --   HGB 11.4* 10.2* 9.7* 10.2*  HCT 35.7* 31.8* 31.1* 31.5*  MCV 124.4* 126.2* 124.9* 125.0*  PLT 354 247 264 935   Basic Metabolic Panel: Recent Labs  Lab 03/15/20 1213 03/16/20 0236 03/17/20 0525 03/18/20 0539  NA 143 144 142 142  K 4.1 4.5 3.9 4.0  CL 108 112* 107 109  CO2 22  21* 24 24  GLUCOSE 247* 152* 145* 178*  BUN 26* 32* 51* 51*  CREATININE 1.18 1.24 1.42* 1.33*  CALCIUM 8.8* 8.9 8.0* 8.2*   GFR: Estimated Creatinine Clearance: 44.3 mL/min (A) (by C-G formula based on SCr of 1.33 mg/dL (H)). Liver Function Tests: No results for input(s): AST, ALT, ALKPHOS, BILITOT, PROT, ALBUMIN in the last 168 hours. No results for input(s): LIPASE, AMYLASE in the last 168 hours. No results for input(s): AMMONIA in the last 168  hours. Coagulation Profile: No results for input(s): INR, PROTIME in the last 168 hours. Cardiac Enzymes: No results for input(s): CKTOTAL, CKMB, CKMBINDEX, TROPONINI in the last 168 hours. BNP (last 3 results) No results for input(s): PROBNP in the last 8760 hours. HbA1C: Recent Labs    03/17/20 0526  HGBA1C 4.3*   CBG: No results for input(s): GLUCAP in the last 168 hours. Lipid Profile: Recent Labs    03/17/20 0526  CHOL 116  HDL 38*  LDLCALC 64  TRIG 70  CHOLHDL 3.1   Thyroid Function Tests: No results for input(s): TSH, T4TOTAL, FREET4, T3FREE, THYROIDAB in the last 72 hours. Anemia Panel: No results for input(s): VITAMINB12, FOLATE, FERRITIN, TIBC, IRON, RETICCTPCT in the last 72 hours. Sepsis Labs: Recent Labs  Lab 03/15/20 1213 03/15/20 1537 03/15/20 1949 03/16/20 0236 03/17/20 0525  PROCALCITON  --   --  0.13 <0.10 <0.10  LATICACIDVEN 1.8 1.5  --   --   --     Recent Results (from the past 240 hour(s))  Respiratory Panel by RT PCR (Flu A&B, Covid) - Nasopharyngeal Swab     Status: None   Collection Time: 03/15/20 12:13 PM   Specimen: Nasopharyngeal Swab  Result Value Ref Range Status   SARS Coronavirus 2 by RT PCR NEGATIVE NEGATIVE Final    Comment: (NOTE) SARS-CoV-2 target nucleic acids are NOT DETECTED. The SARS-CoV-2 RNA is generally detectable in upper respiratoy specimens during the acute phase of infection. The lowest concentration of SARS-CoV-2 viral copies this assay can detect is 131 copies/mL. A negative result does not preclude SARS-Cov-2 infection and should not be used as the sole basis for treatment or other patient management decisions. A negative result may occur with  improper specimen collection/handling, submission of specimen other than nasopharyngeal swab, presence of viral mutation(s) within the areas targeted by this assay, and inadequate number of viral copies (<131 copies/mL). A negative result must be combined with  clinical observations, patient history, and epidemiological information. The expected result is Negative. Fact Sheet for Patients:  PinkCheek.be Fact Sheet for Healthcare Providers:  GravelBags.it This test is not yet ap proved or cleared by the [place] FDA and  has been authorized for detection and/or diagnosis of SARS-CoV-2 by FDA under an Emergency Use Authorization (EUA). This EUA will remain  in effect (meaning this test can be used) for the duration of the COVID-19 declaration under Section 564(b)(1) of the Act, 21 U.S.C. section 360bbb-3(b)(1), unless the authorization is terminated or revoked sooner.    Influenza A by PCR NEGATIVE NEGATIVE Final   Influenza B by PCR NEGATIVE NEGATIVE Final    Comment: (NOTE) The Xpert Xpress SARS-CoV-2/FLU/RSV assay is intended as an aid in  the diagnosis of influenza from Nasopharyngeal swab specimens and  should not be used as a sole basis for treatment. Nasal washings and  aspirates are unacceptable for Xpert Xpress SARS-CoV-2/FLU/RSV  testing. Fact Sheet for Patients: PinkCheek.be Fact Sheet for Healthcare Providers: GravelBags.it This test is not yet  approved or cleared by the [place] and  has been authorized for detection and/or diagnosis of SARS-CoV-2 by  FDA under an Emergency Use Authorization (EUA). This EUA will remain  in effect (meaning this test can be used) for the duration of the  Covid-19 declaration under Section 564(b)(1) of the Act, 21  U.S.C. section 360bbb-3(b)(1), unless the authorization is  terminated or revoked. Performed at [facility], [place] [address], [place], [place] [address]   Culture, blood (routine x 2)     Status: None (Preliminary result)   Collection Time: 03/15/20 12:13 PM   Specimen: BLOOD  Result Value Ref Range Status   Specimen Description   Final     BLOOD SITE NOT SPECIFIED Performed at [facility] [address], [place], [place] [address]    Special Requests   Final    BOTTLES DRAWN AEROBIC AND ANAEROBIC Blood Culture adequate volume Performed at [facility] [address] [place], [place] [address]    Culture   Final    NO GROWTH 3 DAYS Performed at [facility], [place] [address] [place], [place] [address]    Report Status PENDING  Incomplete  Culture, blood (routine x 2)     Status: None (Preliminary result)   Collection Time: 03/15/20 12:13 PM   Specimen: BLOOD  Result Value Ref Range Status   Specimen Description   Final    BLOOD SITE NOT SPECIFIED Performed at [facility] [address] [place], [place] [address]    Special Requests   Final    BOTTLES DRAWN AEROBIC AND ANAEROBIC Blood Culture adequate volume Performed at [place] [address] [place], [place] [address]    Culture   Final    NO GROWTH 3 DAYS Performed at [facility], [place] [address], [place], [place] [address]    Report Status PENDING  Incomplete  MRSA PCR Screening     Status: None   Collection Time: 03/15/20  9:22 PM   Specimen: Nasal Mucosa; Nasopharyngeal  Result Value Ref Range Status   MRSA by PCR NEGATIVE NEGATIVE Final    Comment:        The GeneXpert MRSA Assay (FDA approved for NASAL specimens only), is one component of a comprehensive MRSA colonization surveillance program. It is not intended to diagnose MRSA infection nor to guide or monitor treatment for MRSA infections. Performed at [facility], [place] [address] [place], [place] [address]   Culture, body fluid-bottle     Status: None (Preliminary result)   Collection Time: 03/16/20  4:06 PM   Specimen: Fluid  Result Value Ref Range Status   Specimen Description FLUID PLEURAL  Final   Special Requests   Final    BOTTLES DRAWN AEROBIC AND ANAEROBIC  Blood Culture adequate volume   Culture   Final    NO GROWTH 2 DAYS Performed at [facility], [place] [address] [place], [place] [address]    Report Status PENDING  Incomplete  Gram stain     Status: None   Collection Time: 03/16/20  4:06 PM   Specimen: Fluid  Result Value Ref Range Status   Specimen Description FLUID PLEURAL  Final   Special Requests SYRINGE  Final   Gram Stain   Final    MODERATE WBC PRESENT, PREDOMINANTLY MONONUCLEAR NO ORGANISMS SEEN Performed at [facility], [place] [address], [place], [place] [address]  Report Status 03/16/2020 FINAL  Final         Radiology Studies: No results found.      Scheduled Meds: . arformoterol  15 mcg Nebulization BID  . aspirin  81 mg Oral Daily  . budesonide (PULMICORT) nebulizer solution  0.5 mg Nebulization BID  . chlorhexidine  15 mL Mouth Rinse BID  . Chlorhexidine Gluconate Cloth  6 each Topical Daily  . docusate sodium  100 mg Oral BID  . heparin injection (subcutaneous)  5,000 Units Subcutaneous Q8H  . ipratropium-albuterol  3 mL Nebulization Q6H  . mouth rinse  15 mL Mouth Rinse q12n4p  . methylPREDNISolone (SOLU-MEDROL) injection  40 mg Intravenous Q6H  . pantoprazole  40 mg Oral BID  . polycarbophil  625 mg Oral BID  . sodium chloride flush  10-40 mL Intracatheter Q12H  . tamsulosin  0.4 mg Oral Daily   Continuous Infusions:   LOS: 3 days    Time spent: 35 minutes    Hogan Hoobler A Melisssa Donner, MD [facility]   If 7PM-7AM, please contact night-coverage [email or web address]  03/18/2020, 4:58 PM

## 2020-03-18 NOTE — Care Management Important Message (Signed)
Important Message  Patient Details IM Letter given to Marney Doctor RN Case Manager to present to the Patient Name: Robert Dixon MRN: [id number] Date of Birth: 1941/09/19   Medicare Important Message Given:  Yes     Kerin Salen 03/18/2020, 10:40 AM

## 2020-03-18 NOTE — TOC Initial Note (Signed)
Transition of Care [facility]) - Initial/Assessment Note    Patient Details  Name: Robert Dixon MRN: [id number] Date of Birth: Jan 09, 1941  [facility] CM/SW Contact:    Lynnell Catalan, RN Phone Number: 03/18/2020, 3:12 PM  Clinical Narrative:                 This CM spoke with pt and son Robert Dixon at bedside for dc planning. Pt receives home 02 currently from [facility]. Pt and son unsure if they are planning to pay for private caregivers for pt to have 24hr care at home or if they want pt to go to SNF short term. They asked that this CM fax out pt FL2 for options of SNF beds. Son Robert Dixon to call this CM in a.m. after speaking with family about pt care moving forward. TOC will continue to follow.  Expected Discharge Plan: Skilled Nursing Facility Barriers to Discharge: Continued Medical Work up    Expected Discharge Plan and Services Expected Discharge Plan: [place]   Discharge Planning Services: CM Consult   Living arrangements for the past 2 months: Single Family Home                   Prior Living Arrangements/Services Living arrangements for the past 2 months: Single Family Home Lives with:: Self Patient language and need for interpreter reviewed:: Yes        Need for Family Participation in Patient Care: Yes (Comment) Care giver support system in place?: Yes (comment)      Activities of Daily Living Home Assistive Devices/Equipment: Eyeglasses, Nebulizer, Oxygen, Other (Comment)(shower chair) ADL Screening (condition at time of admission) Patient's cognitive ability adequate to safely complete daily activities?: Yes Is the patient deaf or have difficulty hearing?: No Does the patient have difficulty seeing, even when wearing glasses/contacts?: No Does the patient have difficulty concentrating, remembering, or making decisions?: No Patient able to express need for assistance with ADLs?: Yes Does the patient have difficulty dressing or bathing?:  Yes Independently performs ADLs?: No Communication: Independent Dressing (OT): Needs assistance Is this a change from baseline?: Change from baseline, expected to last <3days Grooming: Needs assistance Is this a change from baseline?: Change from baseline, expected to last <3 days Feeding: Needs assistance Is this a change from baseline?: Change from baseline, expected to last <3 days Bathing: Needs assistance Is this a change from baseline?: Change from baseline, expected to last <3 days Toileting: Needs assistance Is this a change from baseline?: Change from baseline, expected to last <3 days In/Out Bed: Needs assistance Is this a change from baseline?: Change from baseline, expected to last <3 days Walks in Home: Needs assistance Is this a change from baseline?: Change from baseline, expected to last <3 days Does the patient have difficulty walking or climbing stairs?: Yes(secondary to shortness of breath) Weakness of Legs: Both Weakness of Arms/Hands: None  Permission Sought/Granted                  Emotional Assessment Appearance:: Appears stated age Attitude/Demeanor/Rapport: Engaged Affect (typically observed): Accepting Orientation: : Oriented to Self, Oriented to Place, Oriented to  Time, Oriented to Situation      Admission diagnosis:  Acute pulmonary edema (HCC) [J81.0] COPD exacerbation (HCC) [J44.1] Pleural effusion on left [J90] Acute respiratory failure with hypoxia (HCC) [J96.01] Non-small cell carcinoma of left lung [facility]) [C34.92] Patient Active Problem List   Diagnosis Date Noted  . Acute on chronic respiratory failure with hypoxia (  [place]) 03/16/2020  . COPD exacerbation ([place]) 03/15/2020  . Nonrheumatic aortic valve stenosis 10/14/2019  . Pericardial effusion 10/14/2019  . Urinary hesitancy 09/23/2019  . Pleural effusion 09/16/2019  . Port-A-Cath in place 04/22/2019  . Chronic respiratory failure with hypoxia ([place]) 04/15/2019  . Centrilobular  emphysema ([place]) 04/14/2019  . Encounter for antineoplastic immunotherapy 03/03/2019  . Antineoplastic chemotherapy induced anemia 02/03/2019  . Non-small cell carcinoma of left lung, stage 3 ([place]) 12/19/2018  . Goals of care, counseling/discussion 12/19/2018  . Encounter for antineoplastic chemotherapy 12/19/2018  . Primary malignant neoplasm of left upper lobe of lung ([place]) 12/12/2018  . Hilar mass 12/06/2018  . Mediastinal adenopathy 12/06/2018  . Conjunctivitis unspecified 04/10/2013  . Hypokalemia 04/09/2013  . Acute respiratory failure ([employer]) 04/09/2013  . ARF (acute renal failure) ([place]) 04/05/2013  . CAP (community acquired pneumonia) 04/04/2013  . Leukocytosis, unspecified 04/04/2013  . HTN (hypertension) 04/04/2013   PCP:  [name], MD Pharmacy:   [facility] [address] [place] - [address] [place] [address] Phone: [phone] Fax: [phone]  [place], [place] [address] Melina [place] [address] [place] [address] Phone: [phone] Fax: [phone]     Social Determinants of Health (SDOH) Interventions    Readmission Risk Interventions Readmission Risk Prevention Plan 03/18/2020  Transportation Screening Complete  PCP or Specialist Appt within 3-5 Days Complete  HRI or [place] Complete  Social Work Consult for [place] Planning/Counseling Complete  Palliative Care Screening Not Applicable  Medication Review Press photographer) Complete  Some recent data might be hidden

## 2020-03-19 ENCOUNTER — Encounter (HOSPITAL_COMMUNITY)
Admission: EM | Disposition: A | Discharge status: Home Health | Payer: Self-pay | Source: Home / Self Care | Attending: Cardiology

## 2020-03-19 ENCOUNTER — Other Ambulatory Visit: Payer: Self-pay

## 2020-03-19 DIAGNOSIS — I509 Heart failure, unspecified: Secondary | ICD-10-CM

## 2020-03-19 HISTORY — PX: RIGHT HEART CATH AND CORONARY ANGIOGRAPHY: CATH118264

## 2020-03-19 LAB — POCT I-STAT 7, (LYTES, BLD GAS, ICA,H+H)
Acid-Base Excess: 5 mmol/L — ABNORMAL HIGH (ref 0.0–2.0)
Bicarbonate: 29.4 mmol/L — ABNORMAL HIGH (ref 20.0–28.0)
Calcium, Ion: 1.22 mmol/L (ref 1.15–1.40)
HCT: 30 % — ABNORMAL LOW (ref 39.0–52.0)
Hemoglobin: 10.2 g/dL — ABNORMAL LOW (ref 13.0–17.0)
O2 Saturation: 96 %
Potassium: 3.9 mmol/L (ref 3.5–5.1)
Sodium: 144 mmol/L (ref 135–145)
TCO2: 31 mmol/L (ref 22–32)
pCO2 arterial: 43.4 mmHg (ref 32.0–48.0)
pH, Arterial: 7.439 (ref 7.350–7.450)
pO2, Arterial: 82 mmHg — ABNORMAL LOW (ref 83.0–108.0)

## 2020-03-19 LAB — BASIC METABOLIC PANEL
Anion gap: 10 (ref 5–15)
BUN: 48 mg/dL — ABNORMAL HIGH (ref 8–23)
CO2: 27 mmol/L (ref 22–32)
Calcium: 8.4 mg/dL — ABNORMAL LOW (ref 8.9–10.3)
Chloride: 106 mmol/L (ref 98–111)
Creatinine, Ser: 1.41 mg/dL — ABNORMAL HIGH (ref 0.61–1.24)
GFR calc Af Amer: 55 mL/min — ABNORMAL LOW (ref >60)
GFR calc non Af Amer: 47 mL/min — ABNORMAL LOW (ref >60)
Glucose, Bld: 206 mg/dL — ABNORMAL HIGH (ref 70–99)
Potassium: 3.7 mmol/L (ref 3.5–5.1)
Sodium: 143 mmol/L (ref 135–145)

## 2020-03-19 LAB — CBC
HCT: 30.3 % — ABNORMAL LOW (ref 39.0–52.0)
Hemoglobin: 9.8 g/dL — ABNORMAL LOW (ref 13.0–17.0)
MCH: 40.5 pg — ABNORMAL HIGH (ref 26.0–34.0)
MCHC: 32.3 g/dL (ref 30.0–36.0)
MCV: 125.2 fL — ABNORMAL HIGH (ref 80.0–100.0)
Platelets: 200 10*3/uL (ref 150–400)
RBC: 2.42 MIL/uL — ABNORMAL LOW (ref 4.22–5.81)
RDW: 15.3 % (ref 11.5–15.5)
WBC: 11 10*3/uL — ABNORMAL HIGH (ref 4.0–10.5)
nRBC: 0 % (ref 0.0–0.2)

## 2020-03-19 LAB — POCT I-STAT EG7
Acid-Base Excess: 3 mmol/L — ABNORMAL HIGH (ref 0.0–2.0)
Bicarbonate: 28.4 mmol/L — ABNORMAL HIGH (ref 20.0–28.0)
Calcium, Ion: 1.14 mmol/L — ABNORMAL LOW (ref 1.15–1.40)
HCT: 29 % — ABNORMAL LOW (ref 39.0–52.0)
Hemoglobin: 9.9 g/dL — ABNORMAL LOW (ref 13.0–17.0)
O2 Saturation: 72 %
Potassium: 3.6 mmol/L (ref 3.5–5.1)
Sodium: 146 mmol/L — ABNORMAL HIGH (ref 135–145)
TCO2: 30 mmol/L (ref 22–32)
pCO2, Ven: 45.5 mmHg (ref 44.0–60.0)
pH, Ven: 7.404 (ref 7.250–7.430)
pO2, Ven: 38 mmHg (ref 32.0–45.0)

## 2020-03-19 SURGERY — RIGHT HEART CATH AND CORONARY ANGIOGRAPHY
Anesthesia: LOCAL

## 2020-03-19 MED ORDER — HEPARIN SODIUM (PORCINE) 1000 UNIT/ML IJ SOLN
INTRAMUSCULAR | Status: DC | PRN
  Administered 2020-03-19: 3500 [IU] via INTRAVENOUS

## 2020-03-19 MED ORDER — LIDOCAINE HCL (PF) 1 % IJ SOLN
INTRAMUSCULAR | Status: DC | PRN
  Administered 2020-03-19 (×2): 2 mL via INTRADERMAL

## 2020-03-19 MED ORDER — ASPIRIN 81 MG PO CHEW: 81.0000 mg | CHEWABLE_TABLET | ORAL | Status: DC

## 2020-03-19 MED ORDER — SODIUM CHLORIDE 0.9 % IV SOLN: INTRAVENOUS | Status: DC

## 2020-03-19 MED ORDER — SODIUM CHLORIDE 0.9% FLUSH
3.0000 mL | Freq: Two times a day (BID) | INTRAVENOUS | Status: DC
  Administered 2020-03-20 – 2020-03-25 (×3): 3 mL via INTRAVENOUS

## 2020-03-19 MED ORDER — IOHEXOL 350 MG/ML SOLN
INTRAVENOUS | Status: DC | PRN
  Administered 2020-03-19: 30 mL via INTRA_ARTERIAL

## 2020-03-19 MED ORDER — HEPARIN (PORCINE) IN NACL 1000-0.9 UT/500ML-% IV SOLN
INTRAVENOUS | Status: AC
  Filled 2020-03-19: qty 1000

## 2020-03-19 MED ORDER — SODIUM CHLORIDE 0.9% FLUSH: 3.0000 mL | INTRAVENOUS | Status: DC | PRN

## 2020-03-19 MED ORDER — MIDAZOLAM HCL 2 MG/2ML IJ SOLN
INTRAMUSCULAR | Status: DC | PRN
  Administered 2020-03-19: 1 mg via INTRAVENOUS

## 2020-03-19 MED ORDER — METHYLPREDNISOLONE SODIUM SUCC 40 MG IJ SOLR
40.0000 mg | Freq: Two times a day (BID) | INTRAMUSCULAR | Status: DC
  Administered 2020-03-19 – 2020-03-20 (×2): 40 mg via INTRAVENOUS
  Filled 2020-03-19 (×2): qty 1

## 2020-03-19 MED ORDER — VERAPAMIL HCL 2.5 MG/ML IV SOLN
INTRAVENOUS | Status: AC
  Filled 2020-03-19: qty 2

## 2020-03-19 MED ORDER — LIDOCAINE HCL (PF) 1 % IJ SOLN
INTRAMUSCULAR | Status: AC
  Filled 2020-03-19: qty 30

## 2020-03-19 MED ORDER — FENTANYL CITRATE (PF) 100 MCG/2ML IJ SOLN
INTRAMUSCULAR | Status: DC | PRN
  Administered 2020-03-19: 25 ug via INTRAVENOUS

## 2020-03-19 MED ORDER — FUROSEMIDE 10 MG/ML IJ SOLN
40.0000 mg | Freq: Two times a day (BID) | INTRAMUSCULAR | Status: DC
  Administered 2020-03-19 – 2020-03-22 (×7): 40 mg via INTRAVENOUS
  Filled 2020-03-19 (×7): qty 4

## 2020-03-19 MED ORDER — FUROSEMIDE 10 MG/ML IJ SOLN
INTRAMUSCULAR | Status: AC
  Filled 2020-03-19: qty 4

## 2020-03-19 MED ORDER — FENTANYL CITRATE (PF) 100 MCG/2ML IJ SOLN
INTRAMUSCULAR | Status: AC
  Filled 2020-03-19: qty 2

## 2020-03-19 MED ORDER — SODIUM CHLORIDE 0.9 % IV SOLN
INTRAVENOUS | Status: AC | PRN
  Administered 2020-03-19: 10 mL/h via INTRAVENOUS

## 2020-03-19 MED ORDER — SODIUM CHLORIDE 0.9 % IV SOLN: 250.0000 mL | INTRAVENOUS | Status: DC | PRN

## 2020-03-19 MED ORDER — HYDRALAZINE HCL 20 MG/ML IJ SOLN: 10.0000 mg | INTRAMUSCULAR | Status: AC | PRN

## 2020-03-19 MED ORDER — MIDAZOLAM HCL 2 MG/2ML IJ SOLN
INTRAMUSCULAR | Status: AC
  Filled 2020-03-19: qty 2

## 2020-03-19 MED ORDER — LABETALOL HCL 5 MG/ML IV SOLN: 10.0000 mg | INTRAVENOUS | Status: AC | PRN

## 2020-03-19 MED ORDER — VERAPAMIL HCL 2.5 MG/ML IV SOLN
INTRAVENOUS | Status: DC | PRN
  Administered 2020-03-19: 10 mL via INTRA_ARTERIAL

## 2020-03-19 MED ORDER — SODIUM CHLORIDE 0.9% FLUSH
3.0000 mL | Freq: Two times a day (BID) | INTRAVENOUS | Status: DC
  Administered 2020-03-20 – 2020-03-25 (×4): 3 mL via INTRAVENOUS

## 2020-03-19 MED ORDER — HEPARIN (PORCINE) IN NACL 1000-0.9 UT/500ML-% IV SOLN
INTRAVENOUS | Status: DC | PRN
  Administered 2020-03-19 (×2): 500 mL

## 2020-03-19 MED ORDER — HEPARIN SODIUM (PORCINE) 1000 UNIT/ML IJ SOLN
INTRAMUSCULAR | Status: AC
  Filled 2020-03-19: qty 1

## 2020-03-19 SURGICAL SUPPLY — 13 items
CATH 5FR JL3.5 JR4 ANG PIG MP (CATHETERS) ×1 IMPLANT
CATH BALLN WEDGE 5F 110CM (CATHETERS) ×1 IMPLANT
CATH INFINITI 5FR AL1 (CATHETERS) ×1 IMPLANT
DEVICE RAD COMP TR BAND LRG (VASCULAR PRODUCTS) ×1 IMPLANT
GLIDESHEATH SLEND SS 6F .021 (SHEATH) ×1 IMPLANT
GUIDEWIRE INQWIRE 1.5J.035X260 (WIRE) IMPLANT
INQWIRE 1.5J .035X260CM (WIRE) ×2
KIT HEART LEFT (KITS) ×2 IMPLANT
PACK CARDIAC CATHETERIZATION (CUSTOM PROCEDURE TRAY) ×2 IMPLANT
SHEATH GLIDE SLENDER 4/5FR (SHEATH) ×1 IMPLANT
TRANSDUCER W/STOPCOCK (MISCELLANEOUS) ×2 IMPLANT
TUBING CIL FLEX 10 FLL-RA (TUBING) ×2 IMPLANT
WIRE EMERALD ST .035X260CM (WIRE) ×1 IMPLANT

## 2020-03-19 NOTE — Progress Notes (Signed)
Occupational Therapy Treatment Patient Details Name: Robert Dixon MRN: [id number] DOB: Apr 20, 1941 Today's Date: 03/19/2020    History of present illness 79 y/o male with advanced non-small cell lung cancer and aortic stenosis admitted 03/16/20 for acute on chronic repiratory failure with hypoxemia in the setting of a recurrent left sided pleural effusion. On Home oxygen 3 L.   OT comments  Patient reports not wanting "to push it" before heart cath this afternoon, agreeable to sitting EOB for grooming/hygiene tasks with brief desaturation to mid 80s on 4L O2. Cue patient in pursed lip breathing, able to rebound to 92-93%. Patient expresses wanting to discharge home and that he and his son are trying to work on hiring help for home. Discuss with patient pending level of assistance he is able to secure for home and progress with acute therapy, may needs SNF stay to ensure safety with functional transfers, self care. Patient verbalize understanding.   Follow Up Recommendations  Home health OT;Supervision/Assistance - 24 hour;Other (comment);SNF(vs SNF if unable to hire adequate assist)    Equipment Recommendations  None recommended by OT       Precautions / Restrictions Precautions Precautions: Fall Precaution Comments: monitor sats, HR/ BP soft Restrictions Weight Bearing Restrictions: No       Mobility Bed Mobility Overal bed mobility: Needs Assistance Bed Mobility: Supine to Sit;Sit to Supine     Supine to sit: Supervision;HOB elevated Sit to supine: Supervision      Transfers Overall transfer level: Needs assistance Equipment used: None Transfers: Sit to/from Stand Sit to Stand: Min guard              Balance Overall balance assessment: Needs assistance Sitting-balance support: No upper extremity supported;Feet supported Sitting balance-Leahy Scale: Good     Standing balance support: No upper extremity supported;During functional activity Standing balance-Leahy  Scale: Fair Standing balance comment: for side stepping                           ADL either performed or assessed with clinical judgement   ADL Overall ADL's : Needs assistance/impaired     Grooming: Oral care;Wash/dry face;Set up;Sitting Grooming Details (indicate cue type and reason): pt declined standing sink side doesn't want to "push it" before heart cath later today                 Toilet Transfer: Min guard;Ambulation Toilet Transfer Details (indicate cue type and reason): simulated with side stepping/functional mobility, min guard for safety         Functional mobility during ADLs: Min guard General ADL Comments: patient with decreased activity tolerance this session, declines getting up to chair                Cognition Arousal/Alertness: Awake/alert Behavior During Therapy: WFL for tasks assessed/performed Overall Cognitive Status: Within Functional Limits for tasks assessed                                                General Comments on 4L throughout session: patient desaturates to mid 80s% initially with sitting up, cue for pursed lip breathing while recovering prior to g/h. brief desat to mid 80s again with brushing teeth however able to recover quickly once task completed    Pertinent Vitals/ Pain       Pain Assessment:  No/denies pain         Frequency  Min 2X/week        Progress Toward Goals  OT Goals(current goals can now be found in the care plan section)  Progress towards OT goals: Progressing toward goals  Acute Rehab OT Goals Patient Stated Goal: to be able to go home, OT Goal Formulation: With patient Time For Goal Achievement: 03/31/20 Potential to Achieve Goals: Good ADL Goals Pt Will Perform Grooming: with modified independence;standing;sitting Pt Will Perform Lower Body Dressing: with modified independence;sit to/from stand;sitting/lateral leans Pt Will Transfer to Toilet: with modified  independence;ambulating;grab bars Pt Will Perform Toileting - Clothing Manipulation and hygiene: with modified independence;sitting/lateral leans;sit to/from stand Additional ADL Goal #1: Patient will identify 3 energy conservation strategies to implement at home in order to maximize safety and independence with self care.  Plan Discharge plan needs to be updated       AM-PAC OT "6 Clicks" Daily Activity     Outcome Measure   Help from another person eating meals?: None Help from another person taking care of personal grooming?: A Little Help from another person toileting, which includes using toliet, bedpan, or urinal?: A Little Help from another person bathing (including washing, rinsing, drying)?: A Little Help from another person to put on and taking off regular upper body clothing?: A Little Help from another person to put on and taking off regular lower body clothing?: A Little 6 Click Score: 19    End of Session Equipment Utilized During Treatment: Oxygen  OT Visit Diagnosis: Other abnormalities of gait and mobility (R26.89);Muscle weakness (generalized) (M62.81)   Activity Tolerance Patient limited by fatigue   Patient Left in bed;with call bell/phone within reach           Time: 6629-4765 OT Time Calculation (min): 20 min  Charges: OT General Charges $OT Visit: 1 Visit OT Treatments $Self Care/Home Management : 8-22 mins  Delbert Phenix OT Pager: [place] 03/19/2020, 1:47 PM

## 2020-03-19 NOTE — Interval H&P Note (Signed)
History and Physical Interval Note:  03/19/2020 3:05 PM  Robert Dixon  has presented today for surgery, with the diagnosis of Heart failure.  The various methods of treatment have been discussed with the patient and family. After consideration of risks, benefits and other options for treatment, the patient has consented to  Procedure(s): RIGHT/LEFT HEART CATH AND CORONARY ANGIOGRAPHY (N/A) as a surgical intervention.  The patient's history has been reviewed, patient examined, no change in status, stable for surgery.  I have reviewed the patient's chart and labs.  Questions were answered to the patient's satisfaction.     [name]

## 2020-03-19 NOTE — Progress Notes (Signed)
[facility]  Pleural fluid cytology negative PCCM will sign off  Roselie Awkward, MD [facility] Pager: [phone] Cell: [phone] If no response, call [phone]

## 2020-03-19 NOTE — Progress Notes (Signed)
PROGRESS NOTE    HERSHEY KNAUER  ZOX:[id number] DOB: 29-Oct-1941 DOA: 03/15/2020 PCP: [name], MD   Brief Narrative: Robert Dixon is a 79 y.o. malewith medical history significant forlung cancer, COPD on chronic 3 L oxygen, aortic stenosis, pericardial effusion. Patient presented secondary to dyspnea with concern for a COPD exacerbation. On admission he required BiPAP and was given IV lasix for noted pleural effusions   Assessment & Plan:   Principal Problem:   Acute on chronic respiratory failure with hypoxia (HCC) Active Problems:   Non-small cell carcinoma of left lung, stage 3 (HCC)   Pleural effusion   Nonrheumatic aortic valve stenosis   Pericardial effusion   COPD exacerbation (HCC)   1-Chronic respiratory failure with hypoxemia Secondary to COPD, complicated by underlying lung cancer, pleural effusion and heart failure exacerbation. Required BiPAP initially Underwent thoracentesis 4-20 yielding 1.1 L fluid.  Reactive mesothelia cell.  Further  diuretic per cardiology.  2-left side pleural effusion, transudative: Follow cytology reactive mesothelia cell.  Underwent thoracentesis on 4/20   3-stage IIIb non-small lung cancer: With Dr. Jenetta Loges aortic stenosis systolic heart failure exacerbation: Conservative management for aortic valve. Diuretics per cardiology.  Received 80 mg IV lasix times one on 4/22. [facility], [facility].   5-COPD exacerbation: Continue with Pulmicort Brovana and scheduled nebulizers Solu-Medrol.  Plan to start steroid taper.  6-Leukocytosis: Suspect related to her steroid.  Estimated body mass index is 23.03 kg/m as calculated from the following:   Height as of this encounter: 5\' 8"  (1.727 m).   Weight as of this encounter: 68.7 kg.   DVT prophylaxis: Heparin Code Status: DNR Family Communication: Care discussed directly with patient Disposition Plan:  Patient  is from: Home Anticipated d/c date: 2 or 3 days Barriers to d/c or necessity for inpatient status: When respiratory status improved, and cardiology cleared patient for discharge  Consultants:   Cardiology  Pulmonology  Procedures:     Antimicrobials:    Subjective: Feeling better, getting to his baseline for breathing.   Objective: Vitals:   03/18/20 2035 03/19/20 0027 03/19/20 0153 03/19/20 0618  BP:  103/71  95/61  Pulse:  (!) 105  82  Resp:  17  20  Temp:  (!) 97.4 F (36.3 C)  98 F (36.7 C)  TempSrc:  Oral  Oral  SpO2: 92% 98% 91% 96%  Weight:      Height:        Intake/Output Summary (Last 24 hours) at 03/19/2020 0850 Last data filed at 03/19/2020 0619 Gross per 24 hour  Intake --  Output 1725 ml  Net -1725 ml   Filed Weights   03/17/20 0500 03/17/20 0836  Weight: 68.7 kg 68.7 kg    Examination:  General exam:  NAD Respiratory system: B/L crackles.  Cardiovascular system: S 1, S 2 RRR Gastrointestinal system: BS present , soft, nt Central nervous system: Non focal.  Extremities: Symmetric power    Data Reviewed: I have personally reviewed following labs and imaging studies  CBC: Recent Labs  Lab 03/15/20 1213 03/16/20 0236 03/17/20 0525 03/18/20 0539 03/19/20 0442  WBC 11.3* 5.0 21.5* 16.7* 11.0*  NEUTROABS 9.8*  --   --   --   --   HGB 11.4* 10.2* 9.7* 10.2* 9.8*  HCT 35.7* 31.8* 31.1* 31.5* 30.3*  MCV 124.4* 126.2* 124.9* 125.0* 125.2*  PLT 354 247 264 224 811   Basic Metabolic Panel:  Recent Labs  Lab 03/15/20 1213 03/16/20 0236 03/17/20 0525 03/18/20 0539 03/19/20 0442  NA 143 144 142 142 143  K 4.1 4.5 3.9 4.0 3.7  CL 108 112* 107 109 106  CO2 22 21* 24 24 27   GLUCOSE 247* 152* 145* 178* 206*  BUN 26* 32* 51* 51* 48*  CREATININE 1.18 1.24 1.42* 1.33* 1.41*  CALCIUM 8.8* 8.9 8.0* 8.2* 8.4*   GFR: Estimated Creatinine Clearance: 41.8 mL/min (A) (by C-G formula based on SCr of 1.41 mg/dL (H)). Liver Function Tests: No  results for input(s): AST, ALT, ALKPHOS, BILITOT, PROT, ALBUMIN in the last 168 hours. No results for input(s): LIPASE, AMYLASE in the last 168 hours. No results for input(s): AMMONIA in the last 168 hours. Coagulation Profile: No results for input(s): INR, PROTIME in the last 168 hours. Cardiac Enzymes: No results for input(s): CKTOTAL, CKMB, CKMBINDEX, TROPONINI in the last 168 hours. BNP (last 3 results) No results for input(s): PROBNP in the last 8760 hours. HbA1C: Recent Labs    03/17/20 0526  HGBA1C 4.3*   CBG: No results for input(s): GLUCAP in the last 168 hours. Lipid Profile: Recent Labs    03/17/20 0526  CHOL 116  HDL 38*  LDLCALC 64  TRIG 70  CHOLHDL 3.1   Thyroid Function Tests: No results for input(s): TSH, T4TOTAL, FREET4, T3FREE, THYROIDAB in the last 72 hours. Anemia Panel: No results for input(s): VITAMINB12, FOLATE, FERRITIN, TIBC, IRON, RETICCTPCT in the last 72 hours. Sepsis Labs: Recent Labs  Lab 03/15/20 1213 03/15/20 1537 03/15/20 1949 03/16/20 0236 03/17/20 0525  PROCALCITON  --   --  0.13 <0.10 <0.10  LATICACIDVEN 1.8 1.5  --   --   --     Recent Results (from the past 240 hour(s))  Respiratory Panel by RT PCR (Flu A&B, Covid) - Nasopharyngeal Swab     Status: None   Collection Time: 03/15/20 12:13 PM   Specimen: Nasopharyngeal Swab  Result Value Ref Range Status   SARS Coronavirus 2 by RT PCR NEGATIVE NEGATIVE Final    Comment: (NOTE) SARS-CoV-2 target nucleic acids are NOT DETECTED. The SARS-CoV-2 RNA is generally detectable in upper respiratoy specimens during the acute phase of infection. The lowest concentration of SARS-CoV-2 viral copies this assay can detect is 131 copies/mL. A negative result does not preclude SARS-Cov-2 infection and should not be used as the sole basis for treatment or other patient management decisions. A negative result may occur with  improper specimen collection/handling, submission of specimen  other than nasopharyngeal swab, presence of viral mutation(s) within the areas targeted by this assay, and inadequate number of viral copies (<131 copies/mL). A negative result must be combined with clinical observations, patient history, and epidemiological information. The expected result is Negative. Fact Sheet for Patients:  PinkCheek.be Fact Sheet for Healthcare Providers:  GravelBags.it This test is not yet ap proved or cleared by the [place] FDA and  has been authorized for detection and/or diagnosis of SARS-CoV-2 by FDA under an Emergency Use Authorization (EUA). This EUA will remain  in effect (meaning this test can be used) for the duration of the COVID-19 declaration under Section 564(b)(1) of the Act, 21 U.S.C. section 360bbb-3(b)(1), unless the authorization is terminated or revoked sooner.    Influenza A by PCR NEGATIVE NEGATIVE Final   Influenza B by PCR NEGATIVE NEGATIVE Final    Comment: (NOTE) The Xpert Xpress SARS-CoV-2/FLU/RSV assay is intended as an aid in  the diagnosis of influenza from Nasopharyngeal  swab specimens and  should not be used as a sole basis for treatment. Nasal washings and  aspirates are unacceptable for Xpert Xpress SARS-CoV-2/FLU/RSV  testing. Fact Sheet for Patients: PinkCheek.be Fact Sheet for Healthcare Providers: GravelBags.it This test is not yet approved or cleared by the [place] FDA and  has been authorized for detection and/or diagnosis of SARS-CoV-2 by  FDA under an Emergency Use Authorization (EUA). This EUA will remain  in effect (meaning this test can be used) for the duration of the  Covid-19 declaration under Section 564(b)(1) of the Act, 21  U.S.C. section 360bbb-3(b)(1), unless the authorization is  terminated or revoked. Performed at [facility], [place] [address], [place], [place] [address]   Culture, blood (routine x 2)     Status: None (Preliminary result)   Collection Time: 03/15/20 12:13 PM   Specimen: BLOOD  Result Value Ref Range Status   Specimen Description   Final    BLOOD SITE NOT SPECIFIED Performed at [facility] [address] [place], [place] [address]    Special Requests   Final    BOTTLES DRAWN AEROBIC AND ANAEROBIC Blood Culture adequate volume Performed at [facility] [address], [place], [place] [address]    Culture   Final    NO GROWTH 3 DAYS Performed at [facility], [place] [address] [place], [place] [address]    Report Status PENDING  Incomplete  Culture, blood (routine x 2)     Status: None (Preliminary result)   Collection Time: 03/15/20 12:13 PM   Specimen: BLOOD  Result Value Ref Range Status   Specimen Description   Final    BLOOD SITE NOT SPECIFIED Performed at [facility] [address] [place], [place] [address]    Special Requests   Final    BOTTLES DRAWN AEROBIC AND ANAEROBIC Blood Culture adequate volume Performed at [facility] [address] [place], [place] [address]    Culture   Final    NO GROWTH 3 DAYS Performed at [facility], [place] [address], [place], [place] [address]    Report Status PENDING  Incomplete  MRSA PCR Screening     Status: None   Collection Time: 03/15/20  9:22 PM   Specimen: Nasal Mucosa; Nasopharyngeal  Result Value Ref Range Status   MRSA by PCR NEGATIVE NEGATIVE Final    Comment:        The GeneXpert MRSA Assay (FDA approved for NASAL specimens only), is one component of a comprehensive MRSA colonization surveillance program. It is not intended to diagnose MRSA infection nor to guide or monitor treatment for MRSA infections. Performed at [facility], [place] [address], [place], [place] [address]   Culture, body fluid-bottle     Status: None  (Preliminary result)   Collection Time: 03/16/20  4:06 PM   Specimen: Fluid  Result Value Ref Range Status   Specimen Description FLUID PLEURAL  Final   Special Requests   Final    BOTTLES DRAWN AEROBIC AND ANAEROBIC Blood Culture adequate volume   Culture   Final    NO GROWTH 2 DAYS Performed at [facility], [place] [address] [place],  [address]    Report Status PENDING  Incomplete  Gram stain     Status: None   Collection Time: 03/16/20  4:06 PM   Specimen: Fluid  Result Value Ref Range Status   Specimen  Description FLUID PLEURAL  Final   Special Requests SYRINGE  Final   Gram Stain   Final    MODERATE WBC PRESENT, PREDOMINANTLY MONONUCLEAR NO ORGANISMS SEEN Performed at [facility], [place] [address] [place], [place] [address]    Report Status 03/16/2020 FINAL  Final         Radiology Studies: No results found.      Scheduled Meds: . arformoterol  15 mcg Nebulization BID  . aspirin  81 mg Oral Daily  . budesonide (PULMICORT) nebulizer solution  0.5 mg Nebulization BID  . chlorhexidine  15 mL Mouth Rinse BID  . Chlorhexidine Gluconate Cloth  6 each Topical Daily  . docusate sodium  100 mg Oral BID  . heparin injection (subcutaneous)  5,000 Units Subcutaneous Q8H  . ipratropium-albuterol  3 mL Nebulization Q6H  . mouth rinse  15 mL Mouth Rinse q12n4p  . methylPREDNISolone (SOLU-MEDROL) injection  40 mg Intravenous Q6H  . pantoprazole  40 mg Oral BID  . polycarbophil  625 mg Oral BID  . sodium chloride flush  10-40 mL Intracatheter Q12H  . tamsulosin  0.4 mg Oral Daily   Continuous Infusions:   LOS: 4 days    Time spent: 35 minutes    [place] A Morganna Styles, MD [facility]   If 7PM-7AM, please contact night-coverage [email or web address]  03/19/2020, 8:50 AM

## 2020-03-19 NOTE — Progress Notes (Addendum)
Progress Note  Patient Name: Robert Dixon Date of Encounter: 03/19/2020  Primary Cardiologist: Minus Breeding, MD   Subjective   Patient put out 1.7L overnight. Diuretics held for creatinine/BUN bump. Today patient feels breathing is about the same. He remains on 5-6 L O2. At baseline he uses 3L O2. Denies chest pain.   Patient is NPO for possible cardiac catheterization.   Inpatient Medications    Scheduled Meds: . arformoterol  15 mcg Nebulization BID  . aspirin  81 mg Oral Daily  . budesonide (PULMICORT) nebulizer solution  0.5 mg Nebulization BID  . chlorhexidine  15 mL Mouth Rinse BID  . Chlorhexidine Gluconate Cloth  6 each Topical Daily  . docusate sodium  100 mg Oral BID  . heparin injection (subcutaneous)  5,000 Units Subcutaneous Q8H  . ipratropium-albuterol  3 mL Nebulization Q6H  . mouth rinse  15 mL Mouth Rinse q12n4p  . methylPREDNISolone (SOLU-MEDROL) injection  40 mg Intravenous Q6H  . pantoprazole  40 mg Oral BID  . polycarbophil  625 mg Oral BID  . sodium chloride flush  10-40 mL Intracatheter Q12H  . tamsulosin  0.4 mg Oral Daily   Continuous Infusions:  PRN Meds: acetaminophen **OR** acetaminophen, albuterol, alum & mag hydroxide-simeth, dimenhyDRINATE, guaiFENesin-dextromethorphan, lip balm, ondansetron **OR** ondansetron (ZOFRAN) IV, polyethylene glycol, sodium chloride flush, traMADol   Vital Signs    Vitals:   03/18/20 2035 03/19/20 0027 03/19/20 0153 03/19/20 0618  BP:  103/71  95/61  Pulse:  (!) 105  82  Resp:  17  20  Temp:  (!) 97.4 F (36.3 C)  98 F (36.7 C)  TempSrc:  Oral  Oral  SpO2: 92% 98% 91% 96%  Weight:      Height:        Intake/Output Summary (Last 24 hours) at 03/19/2020 0737 Last data filed at 03/19/2020 6803 Gross per 24 hour  Intake --  Output 1725 ml  Net -1725 ml   Last 3 Weights 03/17/2020 03/17/2020 03/09/2020  Weight (lbs) 151 lb 7.3 oz 151 lb 7.3 oz 163 lb 4.8 oz  Weight (kg) 68.7 kg 68.7 kg 74.072 kg       Telemetry    N/A - Personally Reviewed  ECG    No new - Personally Reviewed  Physical Exam   GEN: No acute distress.   Neck: No JVD Cardiac: RRR, Gr  II to  III/VI systolic murmur base (later peaking)  no rubs, or gallops.  Respiratory: Clear to auscultation bilaterally; 5-6 L O2 GI: Soft, nontender, non-distended  MS: Triv edema; No deformity. Neuro:  Nonfocal  Psych: Normal affect   Labs    High Sensitivity Troponin:   Recent Labs  Lab 03/15/20 1213 03/15/20 1537 03/15/20 1949  TROPONINIHS 85* 127* 134*      Chemistry Recent Labs  Lab 03/17/20 0525 03/18/20 0539 03/19/20 0442  NA 142 142 143  K 3.9 4.0 3.7  CL 107 109 106  CO2 24 24 27   GLUCOSE 145* 178* 206*  BUN 51* 51* 48*  CREATININE 1.42* 1.33* 1.41*  CALCIUM 8.0* 8.2* 8.4*  GFRNONAA 47* 51* 47*  GFRAA 54* 59* 55*  ANIONGAP 11 9 10      Hematology Recent Labs  Lab 03/17/20 0525 03/18/20 0539 03/19/20 0442  WBC 21.5* 16.7* 11.0*  RBC 2.49* 2.52* 2.42*  HGB 9.7* 10.2* 9.8*  HCT 31.1* 31.5* 30.3*  MCV 124.9* 125.0* 125.2*  MCH 39.0* 40.5* 40.5*  MCHC 31.2 32.4 32.3  RDW  15.7* 15.4 15.3  PLT 264 224 200    BNP Recent Labs  Lab 03/15/20 1213 03/16/20 0236  BNP 1,080.5* 910.9*     DDimer No results for input(s): DDIMER in the last 168 hours.   Radiology    No results found.  Cardiac Studies   Echocardiogram 03/16/2020: 1. Hypokinesis of the distal anteroseptal wall and apex; overall moderate  to severe LV dysfunction; calcified aortic valve with probable severe AS  (mean gradient 31 mmHg; AVA 1 cm2) and mild AI; mild MR; mild LAE; small  pericardial effusion with RA  inversion.  2. Left ventricular ejection fraction, by estimation, is 30 to 35%. The  left ventricle has moderate to severely decreased function. The left  ventricle has no regional wall motion abnormalities. The left ventricular  internal cavity size was mildly  dilated. There is mild left ventricular  hypertrophy. Left ventricular  diastolic parameters are indeterminate. Elevated left atrial pressure.  3. Right ventricular systolic function is normal. The right ventricular  size is normal. There is mildly elevated pulmonary artery systolic  pressure.  4. Left atrial size was mildly dilated.  5. The mitral valve is normal in structure. Mild mitral valve  regurgitation. No evidence of mitral stenosis.  6. The aortic valve has an indeterminant number of cusps. Aortic valve  regurgitation is mild. No aortic stenosis is present.   Patient Profile     79 y.o. male PMH of severe aortic stenosis, HTN, NSCL cancer (stage IIIb) s/p chemo/XRT and currently on immunotherapy, COPD on 2-3L O2 via [place] at baseline, and GERD,who is being followed by cardiology for the evaluation ofCHF/AS  Assessment & Plan    Acute systolic CHF - Echo this admission showed LVEF is severely depressed, EF 30-35% with some wall motion abnormality - Patient has not chest pain but CT scan shows atherosclerosis.  - BNP 1080 and B/L pleural effusions on imaging>>s/p thoracentesis 4/20 (transudative) - Was on IV lasix, received 80 mg yesterday>> Diuretics held today - total output -2.5L, 1.7L urine out overnight - Check daily weights - creatinine on admission 1.18. It has increased during admission, 1.3>1.41 today. Would continue to hold diuretics, patient appears euvolemic one exam  Acute on chronic hypoxic respiratory failure - multifactorial with CHF, severe AS, and COPD in the setting of underlying lung cancer - At baseline patient uses 3LO2 - Patient underwent thoracentesis 4/20 yielding 1.1L transudative fluid and feels his breathing is improved but still sob with 5-6LO2   Elevated troponin  - [phone] this trend is flat and not consistent with ACS - likely demand ischemia from CHF exacerbation - CT chest this admission shows evidence of extensive coronary artery calcifications  Severe AS - mean  gradient 31 mmHg on echo 03/17/20  - Will discuss plan with MD.   HTN  - Not on meds at baseline. Pressures soft  HLD - LDL 64  For questions or updates, please contact [place] Please consult [email or web address] for contact info under        Signed, Cadence Ninfa Meeker, PA-C  03/19/2020, 7:37 AM    Pt seen and examined   I have amended note above by C FUrth to reflect my findings  Pt has diuresed   Breathing is improved this AM ON exam:  Pt on 6L  Lungs with mild wheeze Card  RRR  Gr II ti III/VI systolic murmur base    Abd is supple  Ext with trivial edema  I spoke to both Dr Julien Nordmann (  onc) and Dr Valeta Harms (pulm) yesterday From an oncologic standpoint pt has had a good response from immunotherapy   CT neg for mass in Jan and again now (did have effusions)  With this response he has  good short/intermed prognosis I spoke to Dr Valeta Harms who felt his pulm processes were signficant but stable   WOuld not preclude nonsurgical procedures Given above, pt eager to feel better.   I think his cardiac problems are limiting with AS and LV dysfunction.   I think it is reasonable to do R / L heart cath to further define things, grade valve and also look for discrete coronary lesions.   He understands he is not a surgical candidate  Discussed risks/benefits  Pt understands and agrees to proceed.  Dorris Carnes MD

## 2020-03-19 NOTE — Progress Notes (Signed)
Structural Heart Note: Spoke with patient's son, [name], and reviewed cath findings with him. Will meet with patient and family Sunday morning at 10am for formal TAVR consultation. In the meantime, will diurese him and follow his clinical response to CHF treatment.   [name] 03/19/2020 4:37 PM

## 2020-03-19 NOTE — H&P (View-Only) (Signed)
Progress Note  Patient Name: Robert Dixon Date of Encounter: 03/19/2020  Primary Cardiologist: Minus Breeding, MD   Subjective   Patient put out 1.7L overnight. Diuretics held for creatinine/BUN bump. Today patient feels breathing is about the same. He remains on 5-6 L O2. At baseline he uses 3L O2. Denies chest pain.   Patient is NPO for possible cardiac catheterization.   Inpatient Medications    Scheduled Meds: . arformoterol  15 mcg Nebulization BID  . aspirin  81 mg Oral Daily  . budesonide (PULMICORT) nebulizer solution  0.5 mg Nebulization BID  . chlorhexidine  15 mL Mouth Rinse BID  . Chlorhexidine Gluconate Cloth  6 each Topical Daily  . docusate sodium  100 mg Oral BID  . heparin injection (subcutaneous)  5,000 Units Subcutaneous Q8H  . ipratropium-albuterol  3 mL Nebulization Q6H  . mouth rinse  15 mL Mouth Rinse q12n4p  . methylPREDNISolone (SOLU-MEDROL) injection  40 mg Intravenous Q6H  . pantoprazole  40 mg Oral BID  . polycarbophil  625 mg Oral BID  . sodium chloride flush  10-40 mL Intracatheter Q12H  . tamsulosin  0.4 mg Oral Daily   Continuous Infusions:  PRN Meds: acetaminophen **OR** acetaminophen, albuterol, alum & mag hydroxide-simeth, dimenhyDRINATE, guaiFENesin-dextromethorphan, lip balm, ondansetron **OR** ondansetron (ZOFRAN) IV, polyethylene glycol, sodium chloride flush, traMADol   Vital Signs    Vitals:   03/18/20 2035 03/19/20 0027 03/19/20 0153 03/19/20 0618  BP:  103/71  95/61  Pulse:  (!) 105  82  Resp:  17  20  Temp:  (!) 97.4 F (36.3 C)  98 F (36.7 C)  TempSrc:  Oral  Oral  SpO2: 92% 98% 91% 96%  Weight:      Height:        Intake/Output Summary (Last 24 hours) at 03/19/2020 0737 Last data filed at 03/19/2020 5409 Gross per 24 hour  Intake --  Output 1725 ml  Net -1725 ml   Last 3 Weights 03/17/2020 03/17/2020 03/09/2020  Weight (lbs) 151 lb 7.3 oz 151 lb 7.3 oz 163 lb 4.8 oz  Weight (kg) 68.7 kg 68.7 kg 74.072 kg       Telemetry    N/A - Personally Reviewed  ECG    No new - Personally Reviewed  Physical Exam   GEN: No acute distress.   Neck: No JVD Cardiac: RRR, Gr  II to  III/VI systolic murmur base (later peaking)  no rubs, or gallops.  Respiratory: Clear to auscultation bilaterally; 5-6 L O2 GI: Soft, nontender, non-distended  MS: Triv edema; No deformity. Neuro:  Nonfocal  Psych: Normal affect   Labs    High Sensitivity Troponin:   Recent Labs  Lab 03/15/20 1213 03/15/20 1537 03/15/20 1949  TROPONINIHS 85* 127* 134*      Chemistry Recent Labs  Lab 03/17/20 0525 03/18/20 0539 03/19/20 0442  NA 142 142 143  K 3.9 4.0 3.7  CL 107 109 106  CO2 24 24 27   GLUCOSE 145* 178* 206*  BUN 51* 51* 48*  CREATININE 1.42* 1.33* 1.41*  CALCIUM 8.0* 8.2* 8.4*  GFRNONAA 47* 51* 47*  GFRAA 54* 59* 55*  ANIONGAP 11 9 10      Hematology Recent Labs  Lab 03/17/20 0525 03/18/20 0539 03/19/20 0442  WBC 21.5* 16.7* 11.0*  RBC 2.49* 2.52* 2.42*  HGB 9.7* 10.2* 9.8*  HCT 31.1* 31.5* 30.3*  MCV 124.9* 125.0* 125.2*  MCH 39.0* 40.5* 40.5*  MCHC 31.2 32.4 32.3  RDW  15.7* 15.4 15.3  PLT 264 224 200    BNP Recent Labs  Lab 03/15/20 1213 03/16/20 0236  BNP 1,080.5* 910.9*     DDimer No results for input(s): DDIMER in the last 168 hours.   Radiology    No results found.  Cardiac Studies   Echocardiogram 03/16/2020: 1. Hypokinesis of the distal anteroseptal wall and apex; overall moderate  to severe LV dysfunction; calcified aortic valve with probable severe AS  (mean gradient 31 mmHg; AVA 1 cm2) and mild AI; mild MR; mild LAE; small  pericardial effusion with RA  inversion.  2. Left ventricular ejection fraction, by estimation, is 30 to 35%. The  left ventricle has moderate to severely decreased function. The left  ventricle has no regional wall motion abnormalities. The left ventricular  internal cavity size was mildly  dilated. There is mild left ventricular  hypertrophy. Left ventricular  diastolic parameters are indeterminate. Elevated left atrial pressure.  3. Right ventricular systolic function is normal. The right ventricular  size is normal. There is mildly elevated pulmonary artery systolic  pressure.  4. Left atrial size was mildly dilated.  5. The mitral valve is normal in structure. Mild mitral valve  regurgitation. No evidence of mitral stenosis.  6. The aortic valve has an indeterminant number of cusps. Aortic valve  regurgitation is mild. No aortic stenosis is present.   Patient Profile     79 y.o. 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  - BNP 1080 and B/L pleural effusions on imaging>>s/p thoracentesis 4/20 (transudative) - Was on IV lasix, received 80 mg yesterday>> Diuretics held today - total output -2.5L, 1.7L urine out overnight - Check daily weights - creatinine on admission 1.18. It has increased during admission, 1.3>1.41 today. Would continue to hold diuretics, patient appears euvolemic one exam  Acute on chronic hypoxic respiratory failure - multifactorial with CHF, severe AS, and COPD in the setting of underlying lung cancer - At baseline patient uses 3LO2 - Patient underwent thoracentesis 4/20 yielding 1.1L transudative fluid and feels his breathing is improved but still sob with 5-6LO2   Elevated troponin  - [phone] this trend is flat and not consistent with ACS - likely demand ischemia from CHF exacerbation - CT chest this admission shows evidence of extensive coronary artery calcifications  Severe AS - mean  gradient 31 mmHg on echo 03/17/20  - Will discuss plan with MD.   HTN  - Not on meds at baseline. Pressures soft  HLD - LDL 64  For questions or updates, please contact [place] Please consult [email or web address] for contact info under        Signed, Cadence Ninfa Meeker, PA-C  03/19/2020, 7:37 AM    Pt seen and examined   I have amended note above by C FUrth to reflect my findings  Pt has diuresed   Breathing is improved this AM ON exam:  Pt on 6L  Lungs with mild wheeze Card  RRR  Gr II ti III/VI systolic murmur base    Abd is supple  Ext with trivial edema  I spoke to both Dr Julien Nordmann (  onc) and Dr Valeta Harms (pulm) yesterday From an oncologic standpoint pt has had a good response from immunotherapy   CT neg for mass in Jan and again now (did have effusions)  With this response he has  good short/intermed prognosis I spoke to Dr Valeta Harms who felt his pulm processes were signficant but stable   WOuld not preclude nonsurgical procedures Given above, pt eager to feel better.   I think his cardiac problems are limiting with AS and LV dysfunction.   I think it is reasonable to do R / L heart cath to further define things, grade valve and also look for discrete coronary lesions.   He understands he is not a surgical candidate  Discussed risks/benefits  Pt understands and agrees to proceed.  Dorris Carnes MD

## 2020-03-19 NOTE — Progress Notes (Addendum)
TR BAND REMOVAL  LOCATION:    Right radial  DEFLATED PER PROTOCOL:    Yes.    TIME BAND OFF / DRESSING APPLIED:    1920p Clean dressing with gauze, and tegaderm.   Secured with coban.   SITE UPON ARRIVAL:    Level 0  SITE AFTER BAND REMOVAL:    Level 0  CIRCULATION SENSATION AND MOVEMENT:    Within Normal Limits   Yes.    COMMENTS:   Care instructions given to patients

## 2020-03-20 LAB — CBC
HCT: 34.6 % — ABNORMAL LOW (ref 39.0–52.0)
Hemoglobin: 11.2 g/dL — ABNORMAL LOW (ref 13.0–17.0)
MCH: 40.1 pg — ABNORMAL HIGH (ref 26.0–34.0)
MCHC: 32.4 g/dL (ref 30.0–36.0)
MCV: 124 fL — ABNORMAL HIGH (ref 80.0–100.0)
Platelets: 257 10*3/uL (ref 150–400)
RBC: 2.79 MIL/uL — ABNORMAL LOW (ref 4.22–5.81)
RDW: 15 % (ref 11.5–15.5)
WBC: 13.4 10*3/uL — ABNORMAL HIGH (ref 4.0–10.5)
nRBC: 0 % (ref 0.0–0.2)

## 2020-03-20 LAB — BASIC METABOLIC PANEL
Anion gap: 9 (ref 5–15)
BUN: 45 mg/dL — ABNORMAL HIGH (ref 8–23)
CO2: 29 mmol/L (ref 22–32)
Calcium: 8.7 mg/dL — ABNORMAL LOW (ref 8.9–10.3)
Chloride: 105 mmol/L (ref 98–111)
Creatinine, Ser: 1.25 mg/dL — ABNORMAL HIGH (ref 0.61–1.24)
GFR calc Af Amer: >60 mL/min (ref >60)
GFR calc non Af Amer: 55 mL/min — ABNORMAL LOW (ref >60)
Glucose, Bld: 172 mg/dL — ABNORMAL HIGH (ref 70–99)
Potassium: 3.8 mmol/L (ref 3.5–5.1)
Sodium: 143 mmol/L (ref 135–145)

## 2020-03-20 LAB — LIPASE, FLUID: Lipase-Fluid: 5 U/L

## 2020-03-20 LAB — CULTURE, BLOOD (ROUTINE X 2)
Culture: NO GROWTH
Culture: NO GROWTH
Special Requests: ADEQUATE
Special Requests: ADEQUATE

## 2020-03-20 MED ORDER — METHYLPREDNISOLONE SODIUM SUCC 40 MG IJ SOLR
40.0000 mg | Freq: Every day | INTRAMUSCULAR | Status: DC
  Administered 2020-03-21: 40 mg via INTRAVENOUS
  Filled 2020-03-20: qty 1

## 2020-03-20 MED ORDER — IPRATROPIUM-ALBUTEROL 0.5-2.5 (3) MG/3ML IN SOLN
3.0000 mL | Freq: Three times a day (TID) | RESPIRATORY_TRACT | Status: DC
  Administered 2020-03-20 – 2020-03-22 (×8): 3 mL via RESPIRATORY_TRACT
  Filled 2020-03-20 (×8): qty 3

## 2020-03-20 NOTE — Progress Notes (Signed)
Physical Therapy Treatment Patient Details Name: Robert Dixon MRN: [id number] DOB: 11-12-1941 Today's Date: 03/20/2020    History of Present Illness 79 y/o male with advanced non-small cell lung cancer and aortic stenosis admitted 03/16/20 for acute on chronic repiratory failure with hypoxemia in the setting of a recurrent left sided pleural effusion. On Home oxygen 3 L.    PT Comments    Pt admitted with above diagnosis. Pt was able to ambulate with RW 40 feet with min assist and cues.  Desats to 84% on 4L and needs 6LO2 with activity.  92% on 4L at rest.  Will follow acutely and progress as able.   Pt currently with functional limitations due to balance and endurance deficits. Pt will benefit from skilled PT to increase their independence and safety with mobility to allow discharge to the venue listed below.     Follow Up Recommendations  Home health PT;Supervision/Assistance - 24 hour     Equipment Recommendations  None recommended by PT    Recommendations for Other Services       Precautions / Restrictions Precautions Precautions: Fall Precaution Comments: monitor sats, HR/ BP soft    Mobility  Bed Mobility Overal bed mobility: Needs Assistance Bed Mobility: Supine to Sit;Sit to Supine     Supine to sit: Supervision;HOB elevated Sit to supine: Supervision   General bed mobility comments: for lines and safety  Transfers Overall transfer level: Needs assistance Equipment used: None Transfers: Sit to/from Stand Sit to Stand: Min guard         General transfer comment: No assist needed.    Ambulation/Gait Ambulation/Gait assistance: Min assist Gait Distance (Feet): 40 Feet Assistive device: Rolling walker (2 wheeled) Gait Pattern/deviations: Step-through pattern;Decreased stride length Gait velocity: decr Gait velocity interpretation: <1.31 ft/sec, indicative of household ambulator General Gait Details: Pt was able to ambulate with RW to door and back to chair  with min assist and cues.  Pt 92% on 4LO2; 84% on 6LO2 with activity and needed incr time to recover.     Stairs             Wheelchair Mobility    Modified Rankin (Stroke Patients Only)       Balance Overall balance assessment: Needs assistance Sitting-balance support: No upper extremity supported;Feet supported Sitting balance-Leahy Scale: Good     Standing balance support: No upper extremity supported;During functional activity Standing balance-Leahy Scale: Fair Standing balance comment: Pt was able to stand with min guard assist without UE support statically                            Cognition Arousal/Alertness: Awake/alert Behavior During Therapy: WFL for tasks assessed/performed Overall Cognitive Status: Within Functional Limits for tasks assessed                                        Exercises General Exercises - Lower Extremity Ankle Circles/Pumps: AROM;Both;10 reps;Supine Quad Sets: AROM;Both;10 reps;Supine(3 reps) Gluteal Sets: AROM;Both;Supine;10 reps(3 reps)    General Comments        Pertinent Vitals/Pain Pain Assessment: No/denies pain    Home Living                      Prior Function            PT Goals (current goals can now be  found in the care plan section) Acute Rehab PT Goals Patient Stated Goal: to be able to go home, Progress towards PT goals: Progressing toward goals    Frequency    Min 3X/week      PT Plan Current plan remains appropriate    Co-evaluation              AM-PAC PT "6 Clicks" Mobility   Outcome Measure  Help needed turning from your back to your side while in a flat bed without using bedrails?: None Help needed moving from lying on your back to sitting on the side of a flat bed without using bedrails?: None Help needed moving to and from a bed to a chair (including a wheelchair)?: A Little Help needed standing up from a chair using your arms (e.g., wheelchair  or bedside chair)?: A Little Help needed to walk in hospital room?: A Little Help needed climbing 3-5 steps with a railing? : A Lot 6 Click Score: 19    End of Session Equipment Utilized During Treatment: Oxygen;Gait belt Activity Tolerance: Patient limited by fatigue;Treatment limited secondary to medical complications (Comment)(hypoxia and dyspnea with activity) Patient left: with call bell/phone within reach;in chair;with chair alarm set Nurse Communication: Mobility status PT Visit Diagnosis: Unsteadiness on feet (R26.81);Difficulty in walking, not elsewhere classified (R26.2)     Time: 1660-6301 PT Time Calculation (min) (ACUTE ONLY): 27 min  Charges:  $Gait Training: 23-37 mins                     Robert Dixon W,PT [place] Pager:  [phone]  Office:  [place] 03/20/2020, 1:46 PM

## 2020-03-20 NOTE — Progress Notes (Signed)
PROGRESS NOTE    Robert Dixon  MWN:[id number] DOB: 12/20/1940 DOA: 03/15/2020 PCP: [name], MD   Brief Narrative:  Robert Dixon is a 79 y.o. malewith medical history significant forlung cancer, COPD on chronic 3 L oxygen, aortic stenosis, pericardial effusion. Patient presented secondary to dyspnea with concern for a COPD exacerbation. On admission he required BiPAP and was given IV lasix for noted pleural effusions, his work-up was significant for significant drop in EF from 60% in January of this year, to 30% this admission, he had right/left cardiac cath/23, significant for severe aortic stenosis, with calcified nonobstructive CAD, to [facility] 4/23 for evaluation by structural heart team regarding TAVR.  Subjective: Reports his dyspnea has significantly improved, denies cough or chest pain.   Assessment & Plan:   Principal Problem:   Acute on chronic respiratory failure with hypoxia (HCC) Active Problems:   Non-small cell carcinoma of left lung, stage 3 (HCC)   Pleural effusion   Severe aortic stenosis   Pericardial effusion   COPD exacerbation (HCC)   Hypoxic respiratory failure . -At baseline patient on 3 L nasal cannula, he is hypoxemic on presentation, requiring BiPAP . -There is to be multifactorial, in the setting of diuresis admission, pleural effusion and chronic systolic CHF/severe aortic stenosis . -Proved respiratory status currently, back at baseline on 3 L nasal cannula.  Acute on chronic systolic CHF -Significant drop of EF 30%, with elevated BNP on presentation, with pleural effusion,. -Severe aortic stenosis contributing to his CHF, management per cardiology, he is on IV diuresis.  Severe aortic stenosis -Management per cardiology, to be seen by Dr. Burt Knack for formal TAVR consult, discussed with family about further plan upon his evaluation, diuresis per cardiology. -Per previous discussion with oncology, his lung malignancy with good response  to immunotherapy, with CT scan negative for mass, so this should not preclude him from surgery if felt indicated.  left side pleural effusion, transudative: Follow cytology reactive mesothelia cell.  Underwent thoracentesis on 4/20   stage IIIb non-small lung cancer: With Dr. Julien Dixon -Cardiology discussion with his primary oncologist Dr. Earlie Server, patient is having a good response to immunotherapy.  CT scan was negative for mass.  He has a good short/intermediate prognosis  COPD exacerbation:  - Continue with Pulmicort Brovana and scheduled nebulizers Solu-Medrol.  Today, will taper his steroids.  Leukocytosis: Suspect related to her steroid.  Estimated body mass index is 23.46 kg/m as calculated from the following:   Height as of this encounter: 5\' 8"  (1.727 m).   Weight as of this encounter: 70 kg.   DVT prophylaxis: Heparin Code Status: DNR Family Communication: None at bedside Disposition Plan:  Patient is from: Home Anticipated d/c date: 2 or 3 days Barriers to d/c or necessity for inpatient status: Patient remains in acute systolic CHF, requiring IV diuresis, and further management regarding his aortic valve stenosis .   Consultants:   Cardiology  Pulmonology  Procedures:   Right/left cardiac cath 4/23  Antimicrobials:      Objective: Vitals:   03/20/20 0829 03/20/20 0832 03/20/20 1100 03/20/20 1326  BP:   (!) 113/52   Pulse:   (!) 108   Resp:   20   Temp:   97.7 F (36.5 C)   TempSrc:   Oral   SpO2: 99% 98% 94% 97%  Weight:      Height:        Intake/Output Summary (Last 24 hours) at 03/20/2020 1615 Last data filed at 03/20/2020  1558 Gross per 24 hour  Intake 860 ml  Output 1641 ml  Net -781 ml   Filed Weights   03/17/20 0500 03/17/20 0836 03/20/20 0326  Weight: 68.7 kg 68.7 kg 70 kg    Examination:  Awake Alert, Oriented X 3, No new F.N deficits, Normal affect Symmetrical Chest wall movement, diminished air entry at the bases, no  wheezing RRR,No Gallops,Rubs , patient has systolic murmur no Parasternal Heave +ve B.Sounds, Abd Soft, No tenderness, No rebound - guarding or rigidity. No Cyanosis, Clubbing or edema, No new Rash or bruise       Data Reviewed: I have personally reviewed following labs and imaging studies  CBC: Recent Labs  Lab 03/15/20 1213 03/15/20 1213 03/16/20 0236 03/16/20 0236 03/17/20 0525 03/17/20 0525 03/18/20 0539 03/19/20 0442 03/19/20 1554 03/19/20 1556 03/20/20 0334  WBC 11.3*   < > 5.0  --  21.5*  --  16.7* 11.0*  --   --  13.4*  NEUTROABS 9.8*  --   --   --   --   --   --   --   --   --   --   HGB 11.4*   < > 10.2*   < > 9.7*   < > 10.2* 9.8* 10.2* 9.9* 11.2*  HCT 35.7*   < > 31.8*   < > 31.1*   < > 31.5* 30.3* 30.0* 29.0* 34.6*  MCV 124.4*   < > 126.2*  --  124.9*  --  125.0* 125.2*  --   --  124.0*  PLT 354   < > 247  --  264  --  224 200  --   --  257   < > = values in this interval not displayed.   Basic Metabolic Panel: Recent Labs  Lab 03/16/20 0236 03/16/20 0236 03/17/20 0525 03/17/20 0525 03/18/20 0539 03/19/20 0442 03/19/20 1554 03/19/20 1556 03/20/20 0334  NA 144   < > 142   < > 142 143 144 146* 143  K 4.5   < > 3.9   < > 4.0 3.7 3.9 3.6 3.8  CL 112*  --  107  --  109 106  --   --  105  CO2 21*  --  24  --  24 27  --   --  29  GLUCOSE 152*  --  145*  --  178* 206*  --   --  172*  BUN 32*  --  51*  --  51* 48*  --   --  45*  CREATININE 1.24  --  1.42*  --  1.33* 1.41*  --   --  1.25*  CALCIUM 8.9  --  8.0*  --  8.2* 8.4*  --   --  8.7*   < > = values in this interval not displayed.   GFR: Estimated Creatinine Clearance: 47.1 mL/min (A) (by C-G formula based on SCr of 1.25 mg/dL (H)). Liver Function Tests: No results for input(s): AST, ALT, ALKPHOS, BILITOT, PROT, ALBUMIN in the last 168 hours. No results for input(s): LIPASE, AMYLASE in the last 168 hours. No results for input(s): AMMONIA in the last 168 hours. Coagulation Profile: No results for  input(s): INR, PROTIME in the last 168 hours. Cardiac Enzymes: No results for input(s): CKTOTAL, CKMB, CKMBINDEX, TROPONINI in the last 168 hours. BNP (last 3 results) No results for input(s): PROBNP in the last 8760 hours. HbA1C: No results for input(s): HGBA1C in the last 72  hours. CBG: No results for input(s): GLUCAP in the last 168 hours. Lipid Profile: No results for input(s): CHOL, HDL, LDLCALC, TRIG, CHOLHDL, LDLDIRECT in the last 72 hours. Thyroid Function Tests: No results for input(s): TSH, T4TOTAL, FREET4, T3FREE, THYROIDAB in the last 72 hours. Anemia Panel: No results for input(s): VITAMINB12, FOLATE, FERRITIN, TIBC, IRON, RETICCTPCT in the last 72 hours. Sepsis Labs: Recent Labs  Lab 03/15/20 1213 03/15/20 1537 03/15/20 1949 03/16/20 0236 03/17/20 0525  PROCALCITON  --   --  0.13 <0.10 <0.10  LATICACIDVEN 1.8 1.5  --   --   --     Recent Results (from the past 240 hour(s))  Respiratory Panel by RT PCR (Flu A&B, Covid) - Nasopharyngeal Swab     Status: None   Collection Time: 03/15/20 12:13 PM   Specimen: Nasopharyngeal Swab  Result Value Ref Range Status   SARS Coronavirus 2 by RT PCR NEGATIVE NEGATIVE Final    Comment: (NOTE) SARS-CoV-2 target nucleic acids are NOT DETECTED. The SARS-CoV-2 RNA is generally detectable in upper respiratoy specimens during the acute phase of infection. The lowest concentration of SARS-CoV-2 viral copies this assay can detect is 131 copies/mL. A negative result does not preclude SARS-Cov-2 infection and should not be used as the sole basis for treatment or other patient management decisions. A negative result may occur with  improper specimen collection/handling, submission of specimen other than nasopharyngeal swab, presence of viral mutation(s) within the areas targeted by this assay, and inadequate number of viral copies (<131 copies/mL). A negative result must be combined with clinical observations, patient history, and  epidemiological information. The expected result is Negative. Fact Sheet for Patients:  PinkCheek.be Fact Sheet for Healthcare Providers:  GravelBags.it This test is not yet ap proved or cleared by the [place] FDA and  has been authorized for detection and/or diagnosis of SARS-CoV-2 by FDA under an Emergency Use Authorization (EUA). This EUA will remain  in effect (meaning this test can be used) for the duration of the COVID-19 declaration under Section 564(b)(1) of the Act, 21 U.S.C. section 360bbb-3(b)(1), unless the authorization is terminated or revoked sooner.    Influenza A by PCR NEGATIVE NEGATIVE Final   Influenza B by PCR NEGATIVE NEGATIVE Final    Comment: (NOTE) The Xpert Xpress SARS-CoV-2/FLU/RSV assay is intended as an aid in  the diagnosis of influenza from Nasopharyngeal swab specimens and  should not be used as a sole basis for treatment. Nasal washings and  aspirates are unacceptable for Xpert Xpress SARS-CoV-2/FLU/RSV  testing. Fact Sheet for Patients: PinkCheek.be Fact Sheet for Healthcare Providers: GravelBags.it This test is not yet approved or cleared by the [place] FDA and  has been authorized for detection and/or diagnosis of SARS-CoV-2 by  FDA under an Emergency Use Authorization (EUA). This EUA will remain  in effect (meaning this test can be used) for the duration of the  Covid-19 declaration under Section 564(b)(1) of the Act, 21  U.S.C. section 360bbb-3(b)(1), unless the authorization is  terminated or revoked. Performed at [facility], [place] [address], [place], [place] [address]   Culture, blood (routine x 2)     Status: None   Collection Time: 03/15/20 12:13 PM   Specimen: BLOOD  Result Value Ref Range Status   Specimen Description   Final    BLOOD SITE NOT SPECIFIED Performed at [place] [address] [place], [place] [address]    Special Requests   Final  BOTTLES DRAWN AEROBIC AND ANAEROBIC Blood Culture adequate volume Performed at [place] [address], [place], [place] [address]    Culture   Final    NO GROWTH 5 DAYS Performed at [facility], [place] [address], [place], [place] [address]    Report Status 03/20/2020 FINAL  Final  Culture, blood (routine x 2)     Status: None   Collection Time: 03/15/20 12:13 PM   Specimen: BLOOD  Result Value Ref Range Status   Specimen Description   Final    BLOOD SITE NOT SPECIFIED Performed at [place] [address] [place], [place] [address]    Special Requests   Final    BOTTLES DRAWN AEROBIC AND ANAEROBIC Blood Culture adequate volume Performed at [place] [address], [place], [place] [address]    Culture   Final    NO GROWTH 5 DAYS Performed at [facility], [place] [address], [place], [place] [address]    Report Status 03/20/2020 FINAL  Final  MRSA PCR Screening     Status: None   Collection Time: 03/15/20  9:22 PM   Specimen: Nasal Mucosa; Nasopharyngeal  Result Value Ref Range Status   MRSA by PCR NEGATIVE NEGATIVE Final    Comment:        The GeneXpert MRSA Assay (FDA approved for NASAL specimens only), is one component of a comprehensive MRSA colonization surveillance program. It is not intended to diagnose MRSA infection nor to guide or monitor treatment for MRSA infections. Performed at [facility], [place] [address], [place], [place] [address]   Culture, body fluid-bottle     Status: None (Preliminary result)   Collection Time: 03/16/20  4:06 PM   Specimen: Fluid  Result Value Ref Range Status   Specimen Description FLUID PLEURAL  Final   Special Requests   Final    BOTTLES DRAWN AEROBIC AND ANAEROBIC Blood Culture adequate volume   Culture   Final    NO GROWTH 4 DAYS Performed  at [facility], [place] [address], [place], [place] [address]    Report Status PENDING  Incomplete  Gram stain     Status: None   Collection Time: 03/16/20  4:06 PM   Specimen: Fluid  Result Value Ref Range Status   Specimen Description FLUID PLEURAL  Final   Special Requests SYRINGE  Final   Gram Stain   Final    MODERATE WBC PRESENT, PREDOMINANTLY MONONUCLEAR NO ORGANISMS SEEN Performed at [facility], [place] [address], [place],  [address]    Report Status 03/16/2020 FINAL  Final         Radiology Studies: CARDIAC CATHETERIZATION  Result Date: 03/19/2020 1.  Calcific, nonobstructive coronary artery disease as outlined 2.  Preserved cardiac output of 7.56 L/min and cardiac index 4.16 3.  Known severe calcific aortic stenosis (stage D2 low-flow low gradient) with inability to cross the aortic valve with a wire 4.  Intracardiac hemodynamics suggestive of congestive heart failure with pulmonary capillary wedge pressure 31 mmHg, PA pressure 52/29 with a mean of 40 Recommend: IV diuresis and continued treatment of congestive heart failure, formal structural heart consultation for consideration of TAVR.  This will have to be weighed in the context of the patient's known comorbidities.       Scheduled Meds: . arformoterol  15 mcg Nebulization BID  . aspirin  81 mg Oral Daily  . budesonide (PULMICORT)  nebulizer solution  0.5 mg Nebulization BID  . chlorhexidine  15 mL Mouth Rinse BID  . Chlorhexidine Gluconate Cloth  6 each Topical Daily  . docusate sodium  100 mg Oral BID  . furosemide  40 mg Intravenous Q12H  . heparin injection (subcutaneous)  5,000 Units Subcutaneous Q8H  . ipratropium-albuterol  3 mL Nebulization TID  . mouth rinse  15 mL Mouth Rinse q12n4p  . [START ON 03/21/2020] methylPREDNISolone (SOLU-MEDROL) injection  40 mg Intravenous Daily  . pantoprazole  40 mg Oral BID  . polycarbophil  625 mg Oral BID  . sodium chloride flush  10-40 mL  Intracatheter Q12H  . sodium chloride flush  3 mL Intravenous Q12H  . sodium chloride flush  3 mL Intravenous Q12H  . tamsulosin  0.4 mg Oral Daily   Continuous Infusions: . sodium chloride       LOS: 5 days       Phillips Climes, MD [facility]   If 7PM-7AM, please contact night-coverage [email or web address]  03/20/2020, 4:15 PM

## 2020-03-20 NOTE — Progress Notes (Signed)
Patient states he does not wear a CPAP at home only oxygen at night. Pt states he does not want to begin using a Bipap while in the hospital and refuses to wear tonight. J Laquan Ludden RT [id number]

## 2020-03-20 NOTE — Progress Notes (Signed)
Occupational Therapy Treatment Patient Details Name: Robert Dixon MRN: [id number] DOB: 05/02/1941 Today's Date: 03/20/2020    History of present illness 79 y/o male with advanced non-small cell lung cancer and aortic stenosis admitted 03/16/20 for acute on chronic repiratory failure with hypoxemia in the setting of a recurrent left sided pleural effusion. On Home oxygen 3 L.   OT comments  Pt progressing with OOB ADL OT session. Pt ambulating in room with RW minguardA overall; supervisionA at sink in standing for grooming tasks x2 mins. Pt's O2  On 6L with activity >88% O2NC; 4L at rest >93% O2. Some dizziness reported, but not constant.  BP 120/62 after exertion in sitting. Pt's family appears supportive in room. Pt could benefit from continued OT skilled services. OT following acutely.      Follow Up Recommendations  Home health OT;Supervision/Assistance - 24 hour    Equipment Recommendations  None recommended by OT    Recommendations for Other Services      Precautions / Restrictions Precautions Precautions: Fall Precaution Comments: monitor sats, HR/ BP soft Restrictions Weight Bearing Restrictions: No       Mobility Bed Mobility Overal bed mobility: Needs Assistance Bed Mobility: Supine to Sit;Sit to Supine     Supine to sit: Supervision;HOB elevated Sit to supine: Supervision   General bed mobility comments: in recliner  Transfers Overall transfer level: Needs assistance Equipment used: None Transfers: Sit to/from Stand Sit to Stand: Min guard         General transfer comment: No assist needed.      Balance Overall balance assessment: Needs assistance Sitting-balance support: No upper extremity supported;Feet supported Sitting balance-Leahy Scale: Good     Standing balance support: No upper extremity supported;During functional activity Standing balance-Leahy Scale: Fair Standing balance comment: Pt was able to stand with min guard assist without UE  support statically                           ADL either performed or assessed with clinical judgement   ADL Overall ADL's : Needs assistance/impaired     Grooming: Wash/dry hands;Wash/dry face;Supervision/safety;Standing Grooming Details (indicate cue type and reason): x2 mins standing at sink             Lower Body Dressing: Min guard;Sitting/lateral leans;Sit to/from stand Lower Body Dressing Details (indicate cue type and reason): Pt can perform figure 4 technique for LB ADL. MinguardA to simulate pulling pants to waist.             Functional mobility during ADLs: Min guard;Rolling walker General ADL Comments: Pt with decreased activity tolerance.     Vision   Vision Assessment?: No apparent visual deficits   Perception     Praxis      Cognition Arousal/Alertness: Awake/alert Behavior During Therapy: WFL for tasks assessed/performed Overall Cognitive Status: Within Functional Limits for tasks assessed                                          Exercises General Exercises - Lower Extremity Ankle Circles/Pumps: AROM;Both;10 reps;Supine Quad Sets: AROM;Both;10 reps;Supine(3 reps) Gluteal Sets: AROM;Both;Supine;10 reps(3 reps)   Shoulder Instructions       General Comments 6L O2 with activity >88% O2NC; 4L at rest >93% O2    Pertinent Vitals/ Pain       Pain Assessment: No/denies pain  Home Living                                          Prior Functioning/Environment              Frequency  Min 2X/week        Progress Toward Goals  OT Goals(current goals can now be found in the care plan section)  Progress towards OT goals: Progressing toward goals  Acute Rehab OT Goals Patient Stated Goal: to be able to go home, OT Goal Formulation: With patient Time For Goal Achievement: 03/31/20 Potential to Achieve Goals: Good ADL Goals Pt Will Perform Grooming: with modified  independence;standing;sitting Pt Will Perform Lower Body Dressing: with modified independence;sit to/from stand;sitting/lateral leans Pt Will Transfer to Toilet: with modified independence;ambulating;grab bars Pt Will Perform Toileting - Clothing Manipulation and hygiene: with modified independence;sitting/lateral leans;sit to/from stand Additional ADL Goal #1: Patient will identify 3 energy conservation strategies to implement at home in order to maximize safety and independence with self care.  Plan Discharge plan remains appropriate    Co-evaluation                 AM-PAC OT "6 Clicks" Daily Activity     Outcome Measure   Help from another person eating meals?: None Help from another person taking care of personal grooming?: A Little Help from another person toileting, which includes using toliet, bedpan, or urinal?: A Little Help from another person bathing (including washing, rinsing, drying)?: A Little Help from another person to put on and taking off regular upper body clothing?: None Help from another person to put on and taking off regular lower body clothing?: A Little 6 Click Score: 20    End of Session Equipment Utilized During Treatment: Oxygen;Rolling walker;Gait belt  OT Visit Diagnosis: Unsteadiness on feet (R26.81);Muscle weakness (generalized) (M62.81)   Activity Tolerance Patient tolerated treatment well   Patient Left in chair;with call bell/phone within reach;with chair alarm set;with family/visitor present   Nurse Communication Mobility status        Time: 9774-1423 OT Time Calculation (min): 14 min  Charges: OT General Charges $OT Visit: 1 Visit OT Treatments $Self Care/Home Management : 8-22 mins  Jefferey Pica, OTR/L Acute Rehabilitation Services Pager: [phone] Office: [phone]    Tekoa Amon C 03/20/2020, 4:28 PM

## 2020-03-20 NOTE — Progress Notes (Signed)
Pt refuses to wear bipap at night. He said he does not need it.

## 2020-03-20 NOTE — Progress Notes (Signed)
Progress Note  Patient Name: Robert Dixon Date of Encounter: 03/20/2020  Primary Cardiologist: Minus Breeding, MD   Subjective   Sitting up in bed getting nebulized therapy with respiratory therapy.  Breathing minimally improved.  Able to complete full sentence.  No chest pain.  Inpatient Medications    Scheduled Meds: . arformoterol  15 mcg Nebulization BID  . aspirin  81 mg Oral Daily  . budesonide (PULMICORT) nebulizer solution  0.5 mg Nebulization BID  . chlorhexidine  15 mL Mouth Rinse BID  . Chlorhexidine Gluconate Cloth  6 each Topical Daily  . docusate sodium  100 mg Oral BID  . furosemide  40 mg Intravenous Q12H  . heparin injection (subcutaneous)  5,000 Units Subcutaneous Q8H  . ipratropium-albuterol  3 mL Nebulization TID  . mouth rinse  15 mL Mouth Rinse q12n4p  . methylPREDNISolone (SOLU-MEDROL) injection  40 mg Intravenous Q12H  . pantoprazole  40 mg Oral BID  . polycarbophil  625 mg Oral BID  . sodium chloride flush  10-40 mL Intracatheter Q12H  . sodium chloride flush  3 mL Intravenous Q12H  . sodium chloride flush  3 mL Intravenous Q12H  . tamsulosin  0.4 mg Oral Daily   Continuous Infusions: . sodium chloride     PRN Meds: sodium chloride, acetaminophen **OR** acetaminophen, albuterol, alum & mag hydroxide-simeth, dimenhyDRINATE, guaiFENesin-dextromethorphan, lip balm, ondansetron **OR** ondansetron (ZOFRAN) IV, polyethylene glycol, sodium chloride flush, sodium chloride flush, traMADol   Vital Signs    Vitals:   03/20/20 0732 03/20/20 0825 03/20/20 0829 03/20/20 0832  BP: 116/71     Pulse: (!) 108     Resp: 20     Temp: 97.6 F (36.4 C)     TempSrc: Oral     SpO2: 94% 91% 99% 98%  Weight:      Height:        Intake/Output Summary (Last 24 hours) at 03/20/2020 0834 Last data filed at 03/20/2020 0655 Gross per 24 hour  Intake 360 ml  Output 1191 ml  Net -831 ml   Last 3 Weights 03/20/2020 03/17/2020 03/17/2020  Weight (lbs) 154 lb 4.8 oz  151 lb 7.3 oz 151 lb 7.3 oz  Weight (kg) 69.99 kg 68.7 kg 68.7 kg      Telemetry    Sinus tachycardia 100-110- Personally Reviewed  ECG    No new, prior heart rate 129 possible junctional tachycardia  Physical Exam   GEN: No acute distress.,  Facemask nebulizer in place Neck: No JVD Cardiac: RRR, 3/6 systolic murmur, rubs, or gallops.  Respiratory:  Distant breath sounds. GI: Soft, nontender, non-distended  MS: No edema; No deformity. Neuro:  Nonfocal  Psych: Normal affect   Labs    High Sensitivity Troponin:   Recent Labs  Lab 03/15/20 1213 03/15/20 1537 03/15/20 1949  TROPONINIHS 85* 127* 134*      Chemistry Recent Labs  Lab 03/18/20 0539 03/18/20 0539 03/19/20 0442 03/19/20 0442 03/19/20 1554 03/19/20 1556 03/20/20 0334  NA 142   < > 143   < > 144 146* 143  K 4.0   < > 3.7   < > 3.9 3.6 3.8  CL 109  --  106  --   --   --  105  CO2 24  --  27  --   --   --  29  GLUCOSE 178*  --  206*  --   --   --  172*  BUN 51*  --  48*  --   --   --  45*  CREATININE 1.33*  --  1.41*  --   --   --  1.25*  CALCIUM 8.2*  --  8.4*  --   --   --  8.7*  GFRNONAA 51*  --  47*  --   --   --  55*  GFRAA 59*  --  55*  --   --   --  >60  ANIONGAP 9  --  10  --   --   --  9   < > = values in this interval not displayed.     Hematology Recent Labs  Lab 03/18/20 0539 03/18/20 0539 03/19/20 0442 03/19/20 0442 03/19/20 1554 03/19/20 1556 03/20/20 0334  WBC 16.7*  --  11.0*  --   --   --  13.4*  RBC 2.52*  --  2.42*  --   --   --  2.79*  HGB 10.2*   < > 9.8*   < > 10.2* 9.9* 11.2*  HCT 31.5*   < > 30.3*   < > 30.0* 29.0* 34.6*  MCV 125.0*  --  125.2*  --   --   --  124.0*  MCH 40.5*  --  40.5*  --   --   --  40.1*  MCHC 32.4  --  32.3  --   --   --  32.4  RDW 15.4  --  15.3  --   --   --  15.0  PLT 224  --  200  --   --   --  257   < > = values in this interval not displayed.    BNP Recent Labs  Lab 03/15/20 1213 03/16/20 0236  BNP 1,080.5* 910.9*     DDimer  No results for input(s): DDIMER in the last 168 hours.   Radiology    CARDIAC CATHETERIZATION  Result Date: 03/19/2020 1.  Calcific, nonobstructive coronary artery disease as outlined 2.  Preserved cardiac output of 7.56 L/min and cardiac index 4.16 3.  Known severe calcific aortic stenosis (stage D2 low-flow low gradient) with inability to cross the aortic valve with a wire 4.  Intracardiac hemodynamics suggestive of congestive heart failure with pulmonary capillary wedge pressure 31 mmHg, PA pressure 52/29 with a mean of 40 Recommend: IV diuresis and continued treatment of congestive heart failure, formal structural heart consultation for consideration of TAVR.  This will have to be weighed in the context of the patient's known comorbidities.   Cardiac Studies   Coronary calcium noted on high-resolution CT scan, severe emphysema, aortic atherosclerosis, small pericardial effusion unchanged.  Cardiac catheterization 03/19/2020: 1.  Calcific, nonobstructive coronary artery disease as outlined 2.  Preserved cardiac output of 7.56 L/min and cardiac index 4.16 3.  Known severe calcific aortic stenosis (stage D2 low-flow low gradient) with inability to cross the aortic valve with a wire 4.  Intracardiac hemodynamics suggestive of congestive heart failure with pulmonary capillary wedge pressure 31 mmHg, PA pressure 52/29 with a mean of 40  Patient Profile     79 y.o. male with severe aortic stenosis, chronic systolic heart failure  Assessment & Plan    Severe aortic stenosis -Cardiac catheterization reviewed, Dr. Burt Knack, formal TAVR consult on Monday.  Continue with diuresis.  Wedge pressure 31 mmHg on cath.  Acute on chronic systolic heart failure -EF 30% BNP was 1080 bilateral pleural effusions had thoracentesis performed on 4/20.  Creatinine slightly improved 1.25.  On 40 IV every 12.  This is a more gentler diagnosis than the 80 that he was getting previously which resulted in a slight  increase in creatinine.  Acute on chronic hypoxic respiratory failure -Has severe COPD as well as severe aortic stenosis and systolic heart failure.  Thoracentesis performed.  On oxygen 5 to 6 L.  Essential hypertension -Blood pressures have been somewhat soft during this admission.  Being careful.  Lung cancer -Dr. Harrington Challenger spoke with Dr. Earlie Server, having a good response to immunotherapy.  CT scan was negative for mass.  He has a good short/intermediate prognosis.  Also Dr. Harrington Challenger spoke to [name], pulmonary.    Complex medical issues.  Dr. Burt Knack has spoken to his children.  Further collaboration to ensue.  For questions or updates, please contact [place] Please consult [email or web address] for contact info under        Signed, Candee Furbish, MD  03/20/2020, 8:34 AM

## 2020-03-21 LAB — BASIC METABOLIC PANEL
Anion gap: 9 (ref 5–15)
BUN: 42 mg/dL — ABNORMAL HIGH (ref 8–23)
CO2: 32 mmol/L (ref 22–32)
Calcium: 8.6 mg/dL — ABNORMAL LOW (ref 8.9–10.3)
Chloride: 102 mmol/L (ref 98–111)
Creatinine, Ser: 1.21 mg/dL (ref 0.61–1.24)
GFR calc Af Amer: >60 mL/min (ref >60)
GFR calc non Af Amer: 57 mL/min — ABNORMAL LOW (ref >60)
Glucose, Bld: 123 mg/dL — ABNORMAL HIGH (ref 70–99)
Potassium: 4.1 mmol/L (ref 3.5–5.1)
Sodium: 143 mmol/L (ref 135–145)

## 2020-03-21 LAB — CULTURE, BODY FLUID W GRAM STAIN -BOTTLE
Culture: NO GROWTH
Special Requests: ADEQUATE

## 2020-03-21 LAB — CBC
HCT: 34.4 % — ABNORMAL LOW (ref 39.0–52.0)
Hemoglobin: 11.3 g/dL — ABNORMAL LOW (ref 13.0–17.0)
MCH: 40.2 pg — ABNORMAL HIGH (ref 26.0–34.0)
MCHC: 32.8 g/dL (ref 30.0–36.0)
MCV: 122.4 fL — ABNORMAL HIGH (ref 80.0–100.0)
Platelets: 242 10*3/uL (ref 150–400)
RBC: 2.81 MIL/uL — ABNORMAL LOW (ref 4.22–5.81)
RDW: 15 % (ref 11.5–15.5)
WBC: 12 10*3/uL — ABNORMAL HIGH (ref 4.0–10.5)
nRBC: 0 % (ref 0.0–0.2)

## 2020-03-21 MED ORDER — PREDNISONE 20 MG PO TABS
20.0000 mg | ORAL_TABLET | Freq: Every day | ORAL | Status: DC
  Administered 2020-03-22: 20 mg via ORAL
  Filled 2020-03-21: qty 1

## 2020-03-21 NOTE — Progress Notes (Signed)
Progress Note  Patient Name: Robert Dixon Date of Encounter: 03/21/2020  Primary Cardiologist: Minus Breeding, MD   Subjective   Feels about the same. SOB with activity. No CP  Inpatient Medications    Scheduled Meds: . arformoterol  15 mcg Nebulization BID  . aspirin  81 mg Oral Daily  . budesonide (PULMICORT) nebulizer solution  0.5 mg Nebulization BID  . chlorhexidine  15 mL Mouth Rinse BID  . Chlorhexidine Gluconate Cloth  6 each Topical Daily  . docusate sodium  100 mg Oral BID  . furosemide  40 mg Intravenous Q12H  . heparin injection (subcutaneous)  5,000 Units Subcutaneous Q8H  . ipratropium-albuterol  3 mL Nebulization TID  . mouth rinse  15 mL Mouth Rinse q12n4p  . methylPREDNISolone (SOLU-MEDROL) injection  40 mg Intravenous Daily  . pantoprazole  40 mg Oral BID  . polycarbophil  625 mg Oral BID  . sodium chloride flush  10-40 mL Intracatheter Q12H  . sodium chloride flush  3 mL Intravenous Q12H  . sodium chloride flush  3 mL Intravenous Q12H  . tamsulosin  0.4 mg Oral Daily   Continuous Infusions: . sodium chloride     PRN Meds: sodium chloride, acetaminophen **OR** acetaminophen, albuterol, alum & mag hydroxide-simeth, dimenhyDRINATE, guaiFENesin-dextromethorphan, lip balm, ondansetron **OR** ondansetron (ZOFRAN) IV, polyethylene glycol, sodium chloride flush, sodium chloride flush, traMADol   Vital Signs    Vitals:   03/20/20 1930 03/20/20 2044 03/21/20 0448 03/21/20 0756  BP: 115/69  108/71 120/76  Pulse: (!) 110  (!) 104 (!) 105  Resp: 18  (!) 27 20  Temp: 98 F (36.7 C)  97.6 F (36.4 C) 97.7 F (36.5 C)  TempSrc: Oral  Oral Oral  SpO2: 97% 97% 97% 98%  Weight:   69.7 kg   Height:        Intake/Output Summary (Last 24 hours) at 03/21/2020 0803 Last data filed at 03/21/2020 0448 Gross per 24 hour  Intake 500 ml  Output 1810 ml  Net -1310 ml   Last 3 Weights 03/21/2020 03/20/2020 03/17/2020  Weight (lbs) 153 lb 9.6 oz 154 lb 4.8 oz 151 lb  7.3 oz  Weight (kg) 69.673 kg 69.99 kg 68.7 kg      Telemetry    Sinus tachycardia 100-115- Personally Reviewed  ECG    Possible junctional tachycardia 120- Personally Reviewed  Physical Exam   GEN: No acute distress.   Neck: No JVD Cardiac: RRR, 3/6 SM,no rubs, or gallops.  Respiratory: Clear to auscultation bilaterally. GI: Soft, nontender, non-distended  MS: No edema; No deformity. Neuro:  Nonfocal  Psych: Normal affect   Labs    High Sensitivity Troponin:   Recent Labs  Lab 03/15/20 1213 03/15/20 1537 03/15/20 1949  TROPONINIHS 85* 127* 134*      Chemistry Recent Labs  Lab 03/19/20 0442 03/19/20 1554 03/19/20 1556 03/20/20 0334 03/21/20 0543  NA 143   < > 146* 143 143  K 3.7   < > 3.6 3.8 4.1  CL 106  --   --  105 102  CO2 27  --   --  29 32  GLUCOSE 206*  --   --  172* 123*  BUN 48*  --   --  45* 42*  CREATININE 1.41*  --   --  1.25* 1.21  CALCIUM 8.4*  --   --  8.7* 8.6*  GFRNONAA 47*  --   --  55* 57*  GFRAA 55*  --   --  >  60 >60  ANIONGAP 10  --   --  9 9   < > = values in this interval not displayed.     Hematology Recent Labs  Lab 03/19/20 0442 03/19/20 1554 03/19/20 1556 03/20/20 0334 03/21/20 0543  WBC 11.0*  --   --  13.4* 12.0*  RBC 2.42*  --   --  2.79* 2.81*  HGB 9.8*   < > 9.9* 11.2* 11.3*  HCT 30.3*   < > 29.0* 34.6* 34.4*  MCV 125.2*  --   --  124.0* 122.4*  MCH 40.5*  --   --  40.1* 40.2*  MCHC 32.3  --   --  32.4 32.8  RDW 15.3  --   --  15.0 15.0  PLT 200  --   --  257 242   < > = values in this interval not displayed.    BNP Recent Labs  Lab 03/15/20 1213 03/16/20 0236  BNP 1,080.5* 910.9*     DDimer No results for input(s): DDIMER in the last 168 hours.   Radiology    CARDIAC CATHETERIZATION  Result Date: 03/19/2020 1.  Calcific, nonobstructive coronary artery disease as outlined 2.  Preserved cardiac output of 7.56 L/min and cardiac index 4.16 3.  Known severe calcific aortic stenosis (stage D2 low-flow  low gradient) with inability to cross the aortic valve with a wire 4.  Intracardiac hemodynamics suggestive of congestive heart failure with pulmonary capillary wedge pressure 31 mmHg, PA pressure 52/29 with a mean of 40 Recommend: IV diuresis and continued treatment of congestive heart failure, formal structural heart consultation for consideration of TAVR.  This will have to be weighed in the context of the patient's known comorbidities.   Cardiac Studies   Cardiac catheterization 03/19/2020: 1. Calcific, nonobstructive coronary artery disease as outlined 2. Preserved cardiac output of 7.56 L/min and cardiac index 4.16 3. Known severe calcific aortic stenosis (stage D2 low-flow low gradient) with inability to cross the aortic valve with a wire 4. Intracardiac hemodynamics suggestive of congestive heart failure with pulmonary capillary wedge pressure 31 mmHg, PA pressure 52/29 with a mean of 40  ECHO 03/16/2020: 1. Hypokinesis of the distal anteroseptal wall and apex; overall moderate  to severe LV dysfunction; calcified aortic valve with probable severe AS  (mean gradient 31 mmHg; AVA 1 cm2) and mild AI; mild MR; mild LAE; small  pericardial effusion with RA  inversion.  2. Left ventricular ejection fraction, by estimation, is 30 to 35%. The  left ventricle has moderate to severely decreased function. The left  ventricle has no regional wall motion abnormalities. The left ventricular  internal cavity size was mildly  dilated. There is mild left ventricular hypertrophy. Left ventricular  diastolic parameters are indeterminate. Elevated left atrial pressure.  3. Right ventricular systolic function is normal. The right ventricular  size is normal. There is mildly elevated pulmonary artery systolic  pressure.  4. Left atrial size was mildly dilated.  5. The mitral valve is normal in structure. Mild mitral valve  regurgitation. No evidence of mitral stenosis.  6. The aortic valve  has an indeterminant number of cusps. Aortic valve  regurgitation is mild. No aortic stenosis is present. 1. Hypokinesis of the distal anteroseptal wall and apex; overall moderate  to severe LV dysfunction; calcified aortic valve with probable severe AS  (mean gradient 31 mmHg; AVA 1 cm2) and mild AI; mild MR; mild LAE; small  pericardial effusion with RA  inversion.   Patient Profile  79 y.o. male transferred from [place] with severe aortic stenosis, chronic systolic heart failure, lung cancer with favorable prognosis  Assessment & Plan    Severe aortic stenosis -Cardiac catheterization reviewed, Dr. Burt Knack, formal TAVR consult on Monday.  Continue with diuresis.  Wedge pressure 31 mmHg on cath.  Creatinine today down 1.21. Continue with IV lasix. No changes.  Acute on chronic systolic heart failure -EF 30% BNP was 1080 bilateral pleural effusions had thoracentesis performed on 4/20.  Creatinine slightly improved 1.21.  On 40 IV every 12.  This is a more gentler diagnosis than the 80 that he was getting previously which resulted in a slight increase in creatinine. -Heart rate remains approximately 100-115 sinus tachycardia. Once fully diuresed, will try to initiate Bb.  Acute on chronic hypoxic respiratory failure -Has severe COPD as well as severe aortic stenosis and systolic heart failure.  Thoracentesis performed.  On oxygen 4 to 6 L.  Essential hypertension -Blood pressures have been somewhat soft during this admission.  Being careful.  Lung cancer -Dr. Harrington Challenger spoke with Dr. Earlie Server, having a good response to immunotherapy.  CT scan was negative for mass.  He has a good short/intermediate prognosis.  Also Dr. Harrington Challenger spoke to [name], pulmonary.    Hypoxic respiratory failure/COPD -At baseline patient is on 3 L of oxygen.    Complex medical issues.  Dr. Burt Knack has spoken to his children.  Further collaboration to ensue.     For questions or updates, please contact  [place] Please consult [email or web address] for contact info under        Signed, Candee Furbish, MD  03/21/2020, 8:03 AM

## 2020-03-21 NOTE — Consult Note (Signed)
[facility]   MULTIDISCIPLINARY HEART VALVE TEAM  Date:  03/21/2020   ID:  Robert Dixon, DOB 11-16-1941, MRN [id number]  PCP:  [name], MD   Chief Complaint  Patient presents with  . Shortness of Breath     HISTORY OF PRESENT ILLNESS: Robert Dixon is a 79 y.o. male who presents with acute on chronic systolic heart failure in the setting of severe aortic stenosis, being seen in structural heart consultation at the request of Dr Harrington Challenger.  The patient was hospitalized at [facility] long hospital last week with acute worsening of his shortness of breath over just a few days.  At baseline, the patient is dyspneic walking short distances.  He developed progressive symptoms of orthopnea and resting shortness of breath prompting his hospitalization.  The patient is on 2 to 3 L of home oxygen since last year for treatment of severe COPD.  He has been followed for moderately severe aortic stenosis with plans for regular echo surveillance every 6 months.  A fairly conservative approach to his care has been appropriately taken as an outpatient in light of stage IIIb lung cancer.  The patient was diagnosed with lung cancer about 18 months ago.  He is felt to have stage IIIb non-small cell lung cancer.  He was initially treated with chemotherapy and radiation therapy.  He has just recently completed a long course of immunotherapy and is felt to demonstrate clinical stability with respect to his cancer.  He has not had any documented distant metastases.  At the time of his hospitalization, he was found to have elevated BNP and findings of interstitial pulmonary edema on imaging studies as well as bilateral pleural effusions.  The patient was diuresed with IV Lasix and underwent left-sided thoracentesis yielding 1.1 L of transudate of pleural fluid.  An echocardiogram in January 2021 demonstrated preserved LV function with LVEF 60 to 65%, moderate pericardial effusion, normal RV function,  severe calcification and restriction of the aortic valve with moderate aortic insufficiency and moderately severe aortic stenosis.  Peak and mean transvalvular gradients were 49 and 33 mmHg, respectively with a calculated valve area of 0.94 cm.  Dimensionless index was 0.3.  An echocardiogram is repeated at the time of his hospitalization March 16, 2020.  This demonstrates a new finding of severe LV dysfunction with LVEF 30 to 35%, calcific restricted aortic valve mobility with a mean gradient of 31 mmHg and calculated valve area of 1 cm, mild aortic insufficiency, and a small pericardial effusion.  The patient then transferred to [name] last Friday afternoon and underwent cardiac catheterization demonstrating diffuse calcific nonobstructive coronary artery disease.  Right heart catheterization demonstrated findings suggestive of left heart failure with pulmonary capillary wedge pressure 31 mmHg and pulmonary artery pressure of 52/29 with a mean of 40.  Cardiac output is preserved.  The aortic valve could not be crossed.  The patient is interviewed with both of his sons at the bedside this morning.  He is a widower for about 4 years.  His wife actually had TAVR several years ago by Dr. Angelena Form and Dr. Cyndia Bent.  The patient lives independently lives locally.  He is O2 dependent and is less active over the last few months than he has been in the past.  He is short of breath with very low level activity now.  He does admit to orthopnea and PND.  He has not had significant leg or abdominal swelling.  He denies chest pain or  pressure.  He has occasional postural dizziness but no history of syncope.  The patient has had regular dental care and reports no significant problems.  Past Medical History:  Diagnosis Date  . Cancer ([place])    skin-   . Complication of anesthesia   . COPD (chronic obstructive pulmonary disease) ([place])   . Dyspnea   . GERD (gastroesophageal reflux disease)   . Hemoptysis 11/20/2018   . History of hiatal hernia   . Hypertension   . Hypoxia 11/20/2018  . 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 Migraine with visual aura   . Non-small cell lung cancer ([place]) dx'd 11/20/18  . Pneumonia 11/20/2018  . PONV (postoperative nausea and vomiting)   . Pulmonary nodule 11/20/2018    Current Facility-Administered Medications  Medication Dose Route Frequency Provider Last Rate Last Admin  . 0.9 %  sodium chloride infusion  250 mL Intravenous PRN [name], MD      . acetaminophen (TYLENOL) tablet 650 mg  650 mg Oral Q6H PRN [name], MD       Or  . acetaminophen (TYLENOL) suppository 650 mg  650 mg Rectal Q6H PRN [name], MD      . albuterol (PROVENTIL) (2.5 MG/3ML) 0.083% nebulizer solution 2.5 mg  2.5 mg Nebulization Q2H PRN [name], MD      . alum & mag hydroxide-simeth (MAALOX/MYLANTA) 200-200-20 MG/5ML suspension 15 mL  15 mL Oral Q6H PRN [name], MD   15 mL at 03/21/20 0522  . arformoterol (BROVANA) nebulizer solution 15 mcg  15 mcg Nebulization BID [name], MD   15 mcg at 03/21/20 [phone]  . aspirin chewable tablet 81 mg  81 mg Oral Daily [name], MD   81 mg at 03/21/20 0905  . budesonide (PULMICORT) nebulizer solution 0.5 mg  0.5 mg Nebulization BID [name], MD   0.5 mg at 03/21/20 0841  . chlorhexidine (PERIDEX) 0.12 % solution 15 mL  15 mL Mouth Rinse BID [name], MD   15 mL at 03/21/20 0906  . Chlorhexidine Gluconate Cloth 2 % PADS 6 each  6 each Topical Daily [name], MD   6 each at 03/21/20 [phone]  . dimenhyDRINATE (DRAMAMINE) tablet 50 mg  50 mg Oral Q8H PRN [name], MD      . docusate sodium (COLACE) capsule 100 mg  100 mg Oral BID [name], MD   100 mg at 03/20/20 2118  . furosemide (LASIX) injection 40 mg  40 mg Intravenous Janalee Dane, MD   40 mg at 03/21/20 0651  . guaiFENesin-dextromethorphan (ROBITUSSIN DM) 100-10 MG/5ML syrup 5 mL  5 mL Oral Q4H PRN [name], MD    5 mL at 03/16/20 1719  . heparin injection 5,000 Units  5,000 Units Subcutaneous Q8H [name], MD   5,000 Units at 03/21/20 0522  . ipratropium-albuterol (DUONEB) 0.5-2.5 (3) MG/3ML nebulizer solution 3 mL  3 mL Nebulization TID Minus Breeding, MD   3 mL at 03/21/20 0842  . lip balm (CARMEX) ointment   Topical PRN [name], MD      . MEDLINE mouth rinse  15 mL Mouth Rinse q12n4p [name], MD   15 mL at 03/20/20 1554  . methylPREDNISolone sodium succinate (SOLU-MEDROL) 40 mg/mL injection 40 mg  40 mg Intravenous Daily Elgergawy, Silver Huguenin, MD   40 mg at 03/21/20 0907  . ondansetron (ZOFRAN) tablet 4 mg  4 mg Oral Q6H PRN [name], MD  Or  . ondansetron (ZOFRAN) injection 4 mg  4 mg Intravenous Q6H PRN [name], MD      . pantoprazole (PROTONIX) EC tablet 40 mg  40 mg Oral BID [name], MD   40 mg at 03/21/20 0905  . polycarbophil (FIBERCON) tablet 625 mg  625 mg Oral BID [name], MD   625 mg at 03/21/20 0905  . polyethylene glycol (MIRALAX / GLYCOLAX) packet 17 g  17 g Oral Daily PRN [name], MD      . sodium chloride flush (NS) 0.9 % injection 10-40 mL  10-40 mL Intracatheter Q12H [name], MD   10 mL at 03/21/20 0907  . sodium chloride flush (NS) 0.9 % injection 10-40 mL  10-40 mL Intracatheter PRN [name], MD      . sodium chloride flush (NS) 0.9 % injection 3 mL  3 mL Intravenous Q12H [name], MD   3 mL at 03/20/20 1005  . sodium chloride flush (NS) 0.9 % injection 3 mL  3 mL Intravenous Q12H [name], MD   3 mL at 03/20/20 1004  . sodium chloride flush (NS) 0.9 % injection 3 mL  3 mL Intravenous PRN [name], MD      . tamsulosin [facility]) capsule 0.4 mg  0.4 mg Oral Daily [name], MD   0.4 mg at 03/21/20 0905  . traMADol (ULTRAM) tablet 50 mg  50 mg Oral BID PRN [name], MD   50 mg at 03/16/20 1648   Facility-Administered Medications Ordered in Other Encounters  Medication Dose  Route Frequency Provider Last Rate Last Admin  . sodium chloride flush (NS) 0.9 % injection 10 mL  10 mL Intracatheter PRN [name], MD      . sodium chloride flush (NS) 0.9 % injection 10 mL  10 mL Intracatheter PRN [name], MD   10 mL at 05/20/19 1232    ALLERGIES:   Patient has no known allergies.   SOCIAL HISTORY:  The patient  reports that he quit smoking about 21 years ago. His smoking use included cigarettes. He quit after 30.00 years of use. He has never used smokeless tobacco. He reports that he does not drink alcohol or use drugs.   FAMILY HISTORY:  The patient's family history includes CVA in his father; Cancer - Cervical in his sister; Cancer - Lung in his mother; Hypertension in his father.   REVIEW OF SYSTEMS:  Positive for generalized fatigue.   All other systems are reviewed and negative.   PHYSICAL EXAM: VS:  BP 120/76 (BP Location: Left Arm)   Pulse (!) 111   Temp 97.7 F (36.5 C) (Oral)   Resp 17   Ht 5\' 8"  (1.727 m)   Wt 69.7 kg   SpO2 99%   BMI 23.35 kg/m  , BMI Body mass index is 23.35 kg/m. GEN: Well nourished, well developed, in no acute distress HEENT: normal Neck: No JVD. carotids 2+ with bilateral bruits Cardiac: The heart is tachycardic and regular with a 3/6 harsh late peaking systolic murmur at the RUSB, no diastolic murmur, absent A2.  No edema. Pedal pulses 2+ = bilaterally  Respiratory:  clear to auscultation bilaterally GI: soft, nontender, nondistended, + BS MS: no deformity or atrophy Skin: warm and dry, no rash Neuro:  Strength and sensation are intact Psych: euthymic mood, full affect  RECENT LABS: 03/09/2020: ALT 12; TSH 2.432 03/16/2020: B Natriuretic Peptide 910.9 03/21/2020: BUN 42; Creatinine, Ser 1.21; Hemoglobin 11.3; Platelets 242;  Potassium 4.1; Sodium 143  03/17/2020: Cholesterol 116; HDL 38; LDL Cholesterol 64; Total CHOL/HDL Ratio 3.1; Triglycerides 70; VLDL 14   Estimated Creatinine Clearance: 48.7 mL/min (by  C-G formula based on SCr of 1.21 mg/dL).   Wt Readings from Last 3 Encounters:  03/21/20 69.7 kg  03/09/20 74.1 kg  02/24/20 73.7 kg     CARDIAC STUDIES: Echo: IMPRESSIONS    1. Hypokinesis of the distal anteroseptal wall and apex; overall moderate  to severe LV dysfunction; calcified aortic valve with probable severe AS  (mean gradient 31 mmHg; AVA 1 cm2) and mild AI; mild MR; mild LAE; small  pericardial effusion with RA  inversion.  2. Left ventricular ejection fraction, by estimation, is 30 to 35%. The  left ventricle has moderate to severely decreased function. The left  ventricle has no regional wall motion abnormalities. The left ventricular  internal cavity size was mildly  dilated. There is mild left ventricular hypertrophy. Left ventricular  diastolic parameters are indeterminate. Elevated left atrial pressure.  3. Right ventricular systolic function is normal. The right ventricular  size is normal. There is mildly elevated pulmonary artery systolic  pressure.  4. Left atrial size was mildly dilated.  5. The mitral valve is normal in structure. Mild mitral valve  regurgitation. No evidence of mitral stenosis.  6. The aortic valve has an indeterminant number of cusps. Aortic valve  regurgitation is mild. No aortic stenosis is present.   FINDINGS  Left Ventricle: Left ventricular ejection fraction, by estimation, is 30  to 35%. The left ventricle has moderate to severely decreased function.  The left ventricle has no regional wall motion abnormalities. The left  ventricular internal cavity size was  mildly dilated. There is mild left ventricular hypertrophy. Left  ventricular diastolic parameters are indeterminate. Elevated left atrial  pressure.   Right Ventricle: The right ventricular size is normal. Right ventricular  systolic function is normal. There is mildly elevated pulmonary artery  systolic pressure. The tricuspid regurgitant velocity is 2.49 m/s,  and  with an assumed right atrial pressure  of 8 mmHg, the estimated right ventricular systolic pressure is 49.6 mmHg.   Left Atrium: Left atrial size was mildly dilated.   Right Atrium: Right atrial size was normal in size.   Pericardium: A small pericardial effusion is present.   Mitral Valve: The mitral valve is normal in structure. Normal mobility of  the mitral valve leaflets. Severe mitral annular calcification. Mild  mitral valve regurgitation. No evidence of mitral valve stenosis. MV peak  gradient, 13.4 mmHg. The mean mitral  valve gradient is 6.0 mmHg.   Tricuspid Valve: The tricuspid valve is normal in structure. Tricuspid  valve regurgitation is trivial. No evidence of tricuspid stenosis.   Aortic Valve: The aortic valve has an indeterminant number of cusps.  Aortic valve regurgitation is mild. Aortic regurgitation PHT measures 210  msec. No aortic stenosis is present. Aortic valve mean gradient measures  31.0 mmHg. Aortic valve peak gradient  measures 44.6 mmHg. Aortic valve area, by VTI measures 1.06 cm.   Pulmonic Valve: The pulmonic valve was normal in structure. Pulmonic valve  regurgitation is not visualized. No evidence of pulmonic stenosis.   Aorta: The aortic root is normal in size and structure.   Venous: The inferior vena cava was not well visualized.    Additional Comments: Hypokinesis of the distal anteroseptal wall and apex;  overall moderate to severe LV dysfunction; calcified aortic valve with  probable severe AS (mean gradient  31 mmHg; AVA 1 cm2) and mild AI; mild  MR; mild LAE; small pericardial  effusion with RA inversion.     LEFT VENTRICLE  PLAX 2D  LVIDd:     5.20 cm Diastology  LVIDs:     4.60 cm LV e' lateral:  11.00 cm/s  LV PW:     1.20 cm LV E/e' lateral: 15.5  LV IVS:    1.20 cm LV e' medial:  7.29 cm/s  LVOT diam:   2.10 cm LV E/e' medial: 23.5  LV SV:     77  LV SV Index:  41  LVOT Area:    3.46 cm     RIGHT VENTRICLE  RV Basal diam: 2.50 cm  RV S prime:   5.11 cm/s  TAPSE (M-mode): 2.1 cm   LEFT ATRIUM       Index    RIGHT ATRIUM      Index  LA diam:    4.30 cm 2.29 cm/m RA Area:   16.40 cm  LA Vol (A2C):  68.8 ml 36.69 ml/m RA Volume:  47.10 ml 25.12 ml/m  LA Vol (A4C):  65.0 ml 34.67 ml/m  LA Biplane Vol: 67.5 ml 36.00 ml/m  AORTIC VALVE  AV Area (Vmax):  1.33 cm  AV Area (Vmean):  1.05 cm  AV Area (VTI):   1.06 cm  AV Vmax:      334.00 cm/s  AV Vmean:     235.500 cm/s  AV VTI:      0.725 m  AV Peak Grad:   44.6 mmHg  AV Mean Grad:   31.0 mmHg  LVOT Vmax:     128.00 cm/s  LVOT Vmean:    71.200 cm/s  LVOT VTI:     0.222 m  LVOT/AV VTI ratio: 0.31  AI PHT:      210 msec    AORTA  Ao Root diam: 3.00 cm   MITRAL VALVE        TRICUSPID VALVE  MV Area (PHT): 3.37 cm   TR Peak grad:  24.8 mmHg  MV Peak grad: 13.4 mmHg  TR Vmax:    249.00 cm/s  MV Mean grad: 6.0 mmHg  MV Vmax:    1.83 m/s   SHUNTS  MV Vmean:   113.0 cm/s  Systemic VTI: 0.22 m  MV Decel Time: 225 msec   Systemic Diam: 2.10 cm  MV E velocity: 171.00 cm/s  MV A velocity: 27.70 cm/s  MV E/A ratio: 6.17   Cardiac Cath: Conclusion  1.  Calcific, nonobstructive coronary artery disease as outlined 2.  Preserved cardiac output of 7.56 L/min and cardiac index 4.16 3.  Known severe calcific aortic stenosis (stage D2 low-flow low gradient) with inability to cross the aortic valve with a wire 4.  Intracardiac hemodynamics suggestive of congestive heart failure with pulmonary capillary wedge pressure 31 mmHg, PA pressure 52/29 with a mean of 40  Recommend: IV diuresis and continued treatment of congestive heart failure, formal structural heart consultation for consideration of TAVR.  This will have to be weighed in the context of the patient's known comorbidities.   STS RISK  CALCULATOR: Isolated AVR: Risk of Mortality: 4.710% Renal Failure: 3.113% Permanent Stroke: 1.397% Prolonged Ventilation: 17.555% DSW Infection: 0.193% Reoperation: 6.442% Morbidity or Mortality: 25.982% Short Length of Stay: 14.680% Long Length of Stay: 19.485%   ASSESSMENT AND PLAN: 79 year old male with acute on chronic systolic heart failure in the setting of severe, stage D2, low-flow low gradient aortic  stenosis with newly found severe LV dysfunction (LVEF 30 to 35%).  I have reviewed the natural history of aortic stenosis with the patient and their family members who are present today. We have discussed the limitations of medical therapy and the poor prognosis associated with symptomatic aortic stenosis. We have reviewed potential treatment options, including palliative medical therapy, conventional surgical aortic valve replacement, and transcatheter aortic valve replacement. We discussed treatment options in the context of the patient's specific comorbid medical conditions.    I have personally reviewed his echocardiogram and cardiac catheterization studies which are outlined above in the history of present illness.  The patient's aortic valve is severely calcified and restricted.  His physical exam findings, echo and cath findings, and development of LV dysfunction, overall consistent with the diagnosis of severe aortic stenosis.  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  I think his recent clinical worsening is clearly related to aortic stenosis and heart failure.  We discussed considerations around TAVR at length today.  I reviewed the remaining work-up that would need to be completed as well as  the multidisciplinary review of his case to include a formal cardiac surgical consultation.  This was contrasted with a palliative approach to his care in light of his comorbid medical problems.  The patient would like to move forward with evaluation for TAVR in order to maximize his quality of life and preserve independence as long as he can.  Will obtain CT angiography studies and request a cardiac surgical consultation while he is hospitalized.  I think he is at high risk for decompensation as he remains tachycardic and dyspneic with minimal activity.  We may need to consider treating him as an inpatient if this can be arranged. The patient and his family members seem to have very good insight and ask appropriate questions about expectations with TAVR this morning.  Deatra James 03/21/2020 11:27 AM     [facility] [address] [place] [address]  [phone] (office) [phone] (fax)

## 2020-03-21 NOTE — Progress Notes (Signed)
PROGRESS NOTE    CALIB WADHWA  IRW:[id number] DOB: Aug 30, 1941 DOA: 03/15/2020 PCP: [name], MD   Brief Narrative:  Robert Dixon is a 79 y.o. malewith medical history significant forlung cancer, COPD on chronic 3 L oxygen, aortic stenosis, pericardial effusion. Patient presented secondary to dyspnea with concern for a COPD exacerbation. On admission he required BiPAP and was given IV lasix for noted pleural effusions, his work-up was significant for significant drop in EF from 60% in January of this year, to 30% this admission, he had right/left cardiac cath/23, significant for severe aortic stenosis, with calcified nonobstructive CAD, to [name] 4/23 for evaluation by structural heart team regarding TAVR.  Subjective: Ports his dyspnea at baseline, worsening with activity, denies cough, as well denies any chest pain.   Assessment & Plan:   Principal Problem:   Acute on chronic respiratory failure with hypoxia (HCC) Active Problems:   Non-small cell carcinoma of left lung, stage 3 (HCC)   Pleural effusion   Severe aortic stenosis   Pericardial effusion   COPD exacerbation (HCC)   Hypoxic respiratory failure . -At baseline patient on 3 L nasal cannula, he is hypoxemic on presentation, requiring BiPAP .   -There is to be multifactorial, in the setting of diuresis admission, pleural effusion and chronic systolic CHF/severe aortic stenosis . - He is back to baseline on 3 L nasal cannula at rest, goes up to 6 L with activity  Acute on chronic systolic CHF -Significant drop of EF 30%, with elevated BNP on presentation, with pleural effusion,. -Severe aortic stenosis contributing to his CHF, management per cardiology, he is on IV diuresis, this is managed by cardiology.  Severe aortic stenosis -Management per cardiology, followed by Dr. Burt Knack, family decided to proceed with TAVR. -Per previous discussion with oncology, his lung malignancy with good response to  immunotherapy, with CT scan negative for mass, so this should not preclude him from surgery if felt indicated.  left side pleural effusion, transudative: Follow cytology reactive mesothelia cell.  Underwent thoracentesis on 4/20   stage IIIb non-small lung cancer: With Dr. Julien Nordmann -Cardiology discussion with his primary oncologist Dr. Earlie Server, patient is having a good response to immunotherapy.  CT scan was negative for mass.  He has a good short/intermediate prognosis  COPD exacerbation:  - Continue with Pulmicort Brovana and scheduled nebulizers Solu-Medrol.  Today, will taper his steroids.  Leukocytosis: Suspect related to her steroid.  Estimated body mass index is 23.35 kg/m as calculated from the following:   Height as of this encounter: 5\' 8"  (1.727 m).   Weight as of this encounter: 69.7 kg.   DVT prophylaxis: Heparin Code Status: DNR Family Communication: None at bedside Disposition Plan:  Patient is from: Home Anticipated d/c date: pending  further work-up regarding TAVR, and cardiology decision about timing of surgery Barriers to d/c or necessity for inpatient status: Patient remains in acute systolic CHF, requiring IV diuresis, and further management regarding his aortic valve stenosis .   Consultants:   Cardiology  Pulmonology  Procedures:   Right/left cardiac cath 4/23  Antimicrobials:      Objective: Vitals:   03/20/20 2044 03/21/20 0448 03/21/20 0756 03/21/20 0842  BP:  108/71 120/76   Pulse:  (!) 104 (!) 105 (!) 111  Resp:  (!) 27 20 17   Temp:  97.6 F (36.4 C) 97.7 F (36.5 C)   TempSrc:  Oral Oral   SpO2: 97% 97% 98% 99%  Weight:  69.7 kg  Height:        Intake/Output Summary (Last 24 hours) at 03/21/2020 1416 Last data filed at 03/21/2020 0900 Gross per 24 hour  Intake 250 ml  Output 1375 ml  Net -1125 ml   Filed Weights   03/17/20 0836 03/20/20 0326 03/21/20 0448  Weight: 68.7 kg 70 kg 69.7 kg    Examination:  Awake Alert,  Oriented X 3, No new F.N deficits, Normal affect Symmetrical Chest wall movement, improved air entry today, no wheezing RRR,No Gallops,Rubs ,+Murmurs, No Parasternal Heave +ve B.Sounds, Abd Soft, No tenderness, No rebound - guarding or rigidity. No Cyanosis, Clubbing or edema, No new Rash or bruise        Data Reviewed: I have personally reviewed following labs and imaging studies  CBC: Recent Labs  Lab 03/15/20 1213 03/16/20 0236 03/17/20 0525 03/17/20 0525 03/18/20 0539 03/18/20 0539 03/19/20 0442 03/19/20 1554 03/19/20 1556 03/20/20 0334 03/21/20 0543  WBC 11.3*   < > 21.5*  --  16.7*  --  11.0*  --   --  13.4* 12.0*  NEUTROABS 9.8*  --   --   --   --   --   --   --   --   --   --   HGB 11.4*   < > 9.7*   < > 10.2*   < > 9.8* 10.2* 9.9* 11.2* 11.3*  HCT 35.7*   < > 31.1*   < > 31.5*   < > 30.3* 30.0* 29.0* 34.6* 34.4*  MCV 124.4*   < > 124.9*  --  125.0*  --  125.2*  --   --  124.0* 122.4*  PLT 354   < > 264  --  224  --  200  --   --  257 242   < > = values in this interval not displayed.   Basic Metabolic Panel: Recent Labs  Lab 03/17/20 0525 03/17/20 0525 03/18/20 0539 03/18/20 0539 03/19/20 0442 03/19/20 1554 03/19/20 1556 03/20/20 0334 03/21/20 0543  NA 142   < > 142   < > 143 144 146* 143 143  K 3.9   < > 4.0   < > 3.7 3.9 3.6 3.8 4.1  CL 107  --  109  --  106  --   --  105 102  CO2 24  --  24  --  27  --   --  29 32  GLUCOSE 145*  --  178*  --  206*  --   --  172* 123*  BUN 51*  --  51*  --  48*  --   --  45* 42*  CREATININE 1.42*  --  1.33*  --  1.41*  --   --  1.25* 1.21  CALCIUM 8.0*  --  8.2*  --  8.4*  --   --  8.7* 8.6*   < > = values in this interval not displayed.   GFR: Estimated Creatinine Clearance: 48.7 mL/min (by C-G formula based on SCr of 1.21 mg/dL). Liver Function Tests: No results for input(s): AST, ALT, ALKPHOS, BILITOT, PROT, ALBUMIN in the last 168 hours. No results for input(s): LIPASE, AMYLASE in the last 168 hours. No  results for input(s): AMMONIA in the last 168 hours. Coagulation Profile: No results for input(s): INR, PROTIME in the last 168 hours. Cardiac Enzymes: No results for input(s): CKTOTAL, CKMB, CKMBINDEX, TROPONINI in the last 168 hours. BNP (last 3 results) No results for input(s): PROBNP in  the last 8760 hours. HbA1C: No results for input(s): HGBA1C in the last 72 hours. CBG: No results for input(s): GLUCAP in the last 168 hours. Lipid Profile: No results for input(s): CHOL, HDL, LDLCALC, TRIG, CHOLHDL, LDLDIRECT in the last 72 hours. Thyroid Function Tests: No results for input(s): TSH, T4TOTAL, FREET4, T3FREE, THYROIDAB in the last 72 hours. Anemia Panel: No results for input(s): VITAMINB12, FOLATE, FERRITIN, TIBC, IRON, RETICCTPCT in the last 72 hours. Sepsis Labs: Recent Labs  Lab 03/15/20 1213 03/15/20 1537 03/15/20 1949 03/16/20 0236 03/17/20 0525  PROCALCITON  --   --  0.13 <0.10 <0.10  LATICACIDVEN 1.8 1.5  --   --   --     Recent Results (from the past 240 hour(s))  Respiratory Panel by RT PCR (Flu A&B, Covid) - Nasopharyngeal Swab     Status: None   Collection Time: 03/15/20 12:13 PM   Specimen: Nasopharyngeal Swab  Result Value Ref Range Status   SARS Coronavirus 2 by RT PCR NEGATIVE NEGATIVE Final    Comment: (NOTE) SARS-CoV-2 target nucleic acids are NOT DETECTED. The SARS-CoV-2 RNA is generally detectable in upper respiratoy specimens during the acute phase of infection. The lowest concentration of SARS-CoV-2 viral copies this assay can detect is 131 copies/mL. A negative result does not preclude SARS-Cov-2 infection and should not be used as the sole basis for treatment or other patient management decisions. A negative result may occur with  improper specimen collection/handling, submission of specimen other than nasopharyngeal swab, presence of viral mutation(s) within the areas targeted by this assay, and inadequate number of viral copies (<131  copies/mL). A negative result must be combined with clinical observations, patient history, and epidemiological information. The expected result is Negative. Fact Sheet for Patients:  PinkCheek.be Fact Sheet for Healthcare Providers:  GravelBags.it This test is not yet ap proved or cleared by the [place] FDA and  has been authorized for detection and/or diagnosis of SARS-CoV-2 by FDA under an Emergency Use Authorization (EUA). This EUA will remain  in effect (meaning this test can be used) for the duration of the COVID-19 declaration under Section 564(b)(1) of the Act, 21 U.S.C. section 360bbb-3(b)(1), unless the authorization is terminated or revoked sooner.    Influenza A by PCR NEGATIVE NEGATIVE Final   Influenza B by PCR NEGATIVE NEGATIVE Final    Comment: (NOTE) The Xpert Xpress SARS-CoV-2/FLU/RSV assay is intended as an aid in  the diagnosis of influenza from Nasopharyngeal swab specimens and  should not be used as a sole basis for treatment. Nasal washings and  aspirates are unacceptable for Xpert Xpress SARS-CoV-2/FLU/RSV  testing. Fact Sheet for Patients: PinkCheek.be Fact Sheet for Healthcare Providers: GravelBags.it This test is not yet approved or cleared by the [place] FDA and  has been authorized for detection and/or diagnosis of SARS-CoV-2 by  FDA under an Emergency Use Authorization (EUA). This EUA will remain  in effect (meaning this test can be used) for the duration of the  Covid-19 declaration under Section 564(b)(1) of the Act, 21  U.S.C. section 360bbb-3(b)(1), unless the authorization is  terminated or revoked. Performed at [facility], [place] [address] [place], [place] [address]   Culture, blood (routine x 2)     Status: None   Collection Time: 03/15/20 12:13 PM   Specimen: BLOOD  Result Value Ref Range Status    Specimen Description   Final    BLOOD SITE NOT SPECIFIED Performed at [facility]  [address] [place], [place] [address]    Special Requests   Final    BOTTLES DRAWN AEROBIC AND ANAEROBIC Blood Culture adequate volume Performed at [facility] [address] [place], [place] [address]    Culture   Final    NO GROWTH 5 DAYS Performed at [facility], [place] [address], [place], [place] [address]    Report Status 03/20/2020 FINAL  Final  Culture, blood (routine x 2)     Status: None   Collection Time: 03/15/20 12:13 PM   Specimen: BLOOD  Result Value Ref Range Status   Specimen Description   Final    BLOOD SITE NOT SPECIFIED Performed at [facility] [address], [place], [place] [address]    Special Requests   Final    BOTTLES DRAWN AEROBIC AND ANAEROBIC Blood Culture adequate volume Performed at [facility] [address] [place], [place] [address]    Culture   Final    NO GROWTH 5 DAYS Performed at [facility], [place] [address] [place], [place] [address]    Report Status 03/20/2020 FINAL  Final  MRSA PCR Screening     Status: None   Collection Time: 03/15/20  9:22 PM   Specimen: Nasal Mucosa; Nasopharyngeal  Result Value Ref Range Status   MRSA by PCR NEGATIVE NEGATIVE Final    Comment:        The GeneXpert MRSA Assay (FDA approved for NASAL specimens only), is one component of a comprehensive MRSA colonization surveillance program. It is not intended to diagnose MRSA infection nor to guide or monitor treatment for MRSA infections. Performed at [facility], [place] [address], [place], [place] [address]   Culture, body fluid-bottle     Status: None   Collection Time: 03/16/20  4:06 PM   Specimen: Fluid  Result Value Ref Range Status   Specimen Description FLUID PLEURAL  Final   Special Requests   Final    BOTTLES DRAWN AEROBIC AND ANAEROBIC Blood  Culture adequate volume   Culture   Final    NO GROWTH 5 DAYS Performed at [facility], [place] [address], [place], [place] [address]    Report Status 03/21/2020 FINAL  Final  Gram stain     Status: None   Collection Time: 03/16/20  4:06 PM   Specimen: Fluid  Result Value Ref Range Status   Specimen Description FLUID PLEURAL  Final   Special Requests SYRINGE  Final   Gram Stain   Final    MODERATE WBC PRESENT, PREDOMINANTLY MONONUCLEAR NO ORGANISMS SEEN Performed at [facility], [place] [address], [place], [place] [address]    Report Status 03/16/2020 FINAL  Final         Radiology Studies: CARDIAC CATHETERIZATION  Result Date: 03/19/2020 1.  Calcific, nonobstructive coronary artery disease as outlined 2.  Preserved cardiac output of 7.56 L/min and cardiac index 4.16 3.  Known severe calcific aortic stenosis (stage D2 low-flow low gradient) with inability to cross the aortic valve with a wire 4.  Intracardiac hemodynamics suggestive of congestive heart failure with pulmonary capillary wedge pressure 31 mmHg, PA pressure 52/29 with a mean of 40 Recommend: IV diuresis and continued treatment of congestive heart failure, formal structural heart consultation for consideration of TAVR.  This will have to be weighed in the context of the patient's known comorbidities.       Scheduled Meds: . arformoterol  15 mcg  Nebulization BID  . aspirin  81 mg Oral Daily  . budesonide (PULMICORT) nebulizer solution  0.5 mg Nebulization BID  . chlorhexidine  15 mL Mouth Rinse BID  . Chlorhexidine Gluconate Cloth  6 each Topical Daily  . docusate sodium  100 mg Oral BID  . furosemide  40 mg Intravenous Q12H  . heparin injection (subcutaneous)  5,000 Units Subcutaneous Q8H  . ipratropium-albuterol  3 mL Nebulization TID  . mouth rinse  15 mL Mouth Rinse q12n4p  . pantoprazole  40 mg Oral BID  . polycarbophil  625 mg Oral BID  . [START ON 03/22/2020] predniSONE  20 mg Oral Q breakfast   . sodium chloride flush  10-40 mL Intracatheter Q12H  . sodium chloride flush  3 mL Intravenous Q12H  . sodium chloride flush  3 mL Intravenous Q12H  . tamsulosin  0.4 mg Oral Daily   Continuous Infusions: . sodium chloride       LOS: 6 days       Phillips Climes, MD [facility]   If 7PM-7AM, please contact night-coverage [email or web address]  03/21/2020, 2:16 PM

## 2020-03-22 ENCOUNTER — Inpatient Hospital Stay (HOSPITAL_COMMUNITY): Payer: Medicare HMO

## 2020-03-22 ENCOUNTER — Telehealth: Payer: Self-pay | Admitting: Medical Oncology

## 2020-03-22 DIAGNOSIS — J9621 Acute and chronic respiratory failure with hypoxia: Secondary | ICD-10-CM

## 2020-03-22 DIAGNOSIS — I350 Nonrheumatic aortic (valve) stenosis: Secondary | ICD-10-CM

## 2020-03-22 LAB — BASIC METABOLIC PANEL
Anion gap: 8 (ref 5–15)
BUN: 41 mg/dL — ABNORMAL HIGH (ref 8–23)
CO2: 34 mmol/L — ABNORMAL HIGH (ref 22–32)
Calcium: 8.3 mg/dL — ABNORMAL LOW (ref 8.9–10.3)
Chloride: 100 mmol/L (ref 98–111)
Creatinine, Ser: 1.24 mg/dL (ref 0.61–1.24)
GFR calc Af Amer: >60 mL/min (ref >60)
GFR calc non Af Amer: 55 mL/min — ABNORMAL LOW (ref >60)
Glucose, Bld: 130 mg/dL — ABNORMAL HIGH (ref 70–99)
Potassium: 3.7 mmol/L (ref 3.5–5.1)
Sodium: 142 mmol/L (ref 135–145)

## 2020-03-22 LAB — CBC
HCT: 33.6 % — ABNORMAL LOW (ref 39.0–52.0)
Hemoglobin: 10.7 g/dL — ABNORMAL LOW (ref 13.0–17.0)
MCH: 38.8 pg — ABNORMAL HIGH (ref 26.0–34.0)
MCHC: 31.8 g/dL (ref 30.0–36.0)
MCV: 121.7 fL — ABNORMAL HIGH (ref 80.0–100.0)
Platelets: 213 10*3/uL (ref 150–400)
RBC: 2.76 MIL/uL — ABNORMAL LOW (ref 4.22–5.81)
RDW: 14.7 % (ref 11.5–15.5)
WBC: 10.4 10*3/uL (ref 4.0–10.5)
nRBC: 0.2 % (ref 0.0–0.2)

## 2020-03-22 MED ORDER — METOPROLOL TARTRATE 5 MG/5ML IV SOLN
INTRAVENOUS | Status: AC
  Administered 2020-03-22: 10 mg via INTRAVENOUS
  Filled 2020-03-22: qty 10

## 2020-03-22 MED ORDER — METOPROLOL TARTRATE 5 MG/5ML IV SOLN: 10.0000 mg | Freq: Once | INTRAVENOUS | Status: AC

## 2020-03-22 MED ORDER — IOHEXOL 350 MG/ML SOLN
100.0000 mL | Freq: Once | INTRAVENOUS | Status: AC | PRN
  Administered 2020-03-22: 100 mL via INTRAVENOUS

## 2020-03-22 NOTE — Telephone Encounter (Signed)
Pt hospitalized. I returned his call and left message to call me back.

## 2020-03-22 NOTE — Progress Notes (Signed)
PROGRESS NOTE    Robert Dixon  HQP:[id number] DOB: January 20, 1941 DOA: 03/15/2020 PCP: Robert Arabian, MD   Brief Narrative:  Robert Dixon is a 79 y.o. malewith medical history significant forlung cancer, COPD on chronic 3 L oxygen, aortic stenosis, pericardial effusion. Patient presented secondary to dyspnea with concern for a COPD exacerbation. On admission he required BiPAP and was given IV lasix for noted pleural effusions, his work-up was significant for significant drop in EF from 60% in January of this year, to 30% this admission, he had right/left cardiac cath/23, significant for severe aortic stenosis, with calcified nonobstructive CAD, to Robert Dixon 4/23 for evaluation by structural heart team regarding TAVR.  Subjective: Reports his dyspnea at baseline, worsening with activity, denies cough, as well denies any chest pain.   Assessment & Plan:   Principal Problem:   Acute on chronic respiratory failure with hypoxia (HCC) Active Problems:   Non-small cell carcinoma of left lung, stage 3 (HCC)   Pleural effusion   Severe aortic stenosis   Pericardial effusion   COPD exacerbation (HCC)   Hypoxic respiratory failure . -At baseline patient on 3 L nasal cannula, he is hypoxemic on presentation, requiring BiPAP initially, but since then no further requirement over last 48 hours. -There is to be multifactorial, in the setting of diuresis admission, pleural effusion and chronic systolic CHF/severe aortic stenosis . - He is back to baseline on 3 L nasal cannula at rest, goes up to 6 L with activity  Acute on chronic systolic CHF -Significant drop of EF 30%, with elevated BNP on presentation, with pleural effusion,. -Severe aortic stenosis contributing to his CHF, management per cardiology, he is on IV diuresis, this is managed by cardiology.  Severe aortic stenosis -Management per cardiology, followed by Dr. Burt Dixon, family decided to proceed with TAVR. -Per previous discussion  with oncology, his lung malignancy with good response to immunotherapy, with CT scan negative for mass, so this should not preclude him from surgery if felt indicated. -Plan for CT angio abdomen pelvis/ chest and coronary morphology .  left side pleural effusion, transudative: Follow cytology reactive mesothelia cell.  - Underwent thoracentesis on 4/20 , cytology only significant for mesothelial cells.  stage IIIb non-small lung cancer: With Dr. Julien Dixon -Cardiology discussion with his primary oncologist Dr. Earlie Dixon, patient is having a good response to immunotherapy.  CT scan was negative for mass.  He has a good short/intermediate prognosis  COPD exacerbation:  - Continue with Pulmicort Brovana and scheduled nebulizers Solu-Medrol.  Addition to oral prednisone.  Leukocytosis: Suspect related to her steroid.  Estimated body mass index is 23.05 kg/m as calculated from the following:   Height as of this encounter: 5\' 8"  (1.727 m).   Weight as of this encounter: 68.8 kg.   DVT prophylaxis: Heparin Code Status: DNR Family Communication: None at bedside Disposition Plan:  Patient is from: Home Anticipated d/c date: pending  further work-up regarding TAVR, and cardiology decision about timing of surgery Barriers to d/c or necessity for inpatient status: Patient remains in acute systolic CHF, requiring IV diuresis, and further management regarding his aortic valve stenosis .   Consultants:   Cardiology  Pulmonology  Procedures:   Right/left cardiac cath 4/23  Antimicrobials:      Objective: Vitals:   03/22/20 0825 03/22/20 1043 03/22/20 1153 03/22/20 1427  BP: 110/63  100/64 103/65  Pulse: (!) 104 (!) 107 91 100  Resp: 20  20 20   Temp: (!) 97.3 F (36.3 C)  97.6 F (36.4 C) 97.9 F (36.6 C)  TempSrc: Oral  Oral Oral  SpO2: 97%  97% 90%  Weight:      Height:        Intake/Output Summary (Last 24 hours) at 03/22/2020 1446 Last data filed at 03/22/2020 1428 Gross  per 24 hour  Intake 497 ml  Output 3230 ml  Net -2733 ml   Filed Weights   03/20/20 0326 03/21/20 0448 03/22/20 0432  Weight: 70 kg 69.7 kg 68.8 kg    Examination:  Awake Alert, Oriented X 3, No new F.N deficits, Normal affect Symmetrical Chest wall movement, Good air movement bilaterally, CTAB RRR,No Gallops,Rubs ,+ Murmurs, No Parasternal Heave +ve B.Sounds, Abd Soft, No tenderness, No rebound - guarding or rigidity. No Cyanosis, Clubbing or edema, No new Rash or bruise         Data Reviewed: I have personally reviewed following labs and imaging studies  CBC: Recent Labs  Lab 03/18/20 0539 03/18/20 0539 03/19/20 0442 03/19/20 0442 03/19/20 1554 03/19/20 1556 03/20/20 0334 03/21/20 0543 03/22/20 0445  WBC 16.7*  --  11.0*  --   --   --  13.4* 12.0* 10.4  HGB 10.2*   < > 9.8*   < > 10.2* 9.9* 11.2* 11.3* 10.7*  HCT 31.5*   < > 30.3*   < > 30.0* 29.0* 34.6* 34.4* 33.6*  MCV 125.0*  --  125.2*  --   --   --  124.0* 122.4* 121.7*  PLT 224  --  200  --   --   --  257 242 213   < > = values in this interval not displayed.   Basic Metabolic Panel: Recent Labs  Lab 03/18/20 0539 03/18/20 0539 03/19/20 0442 03/19/20 0442 03/19/20 1554 03/19/20 1556 03/20/20 0334 03/21/20 0543 03/22/20 0445  NA 142   < > 143   < > 144 146* 143 143 142  K 4.0   < > 3.7   < > 3.9 3.6 3.8 4.1 3.7  CL 109  --  106  --   --   --  105 102 100  CO2 24  --  27  --   --   --  29 32 34*  GLUCOSE 178*  --  206*  --   --   --  172* 123* 130*  BUN 51*  --  48*  --   --   --  45* 42* 41*  CREATININE 1.33*  --  1.41*  --   --   --  1.25* 1.21 1.24  CALCIUM 8.2*  --  8.4*  --   --   --  8.7* 8.6* 8.3*   < > = values in this interval not displayed.   GFR: Estimated Creatinine Clearance: 47.5 mL/min (by C-G formula based on SCr of 1.24 mg/dL). Liver Function Tests: No results for input(s): AST, ALT, ALKPHOS, BILITOT, PROT, ALBUMIN in the last 168 hours. No results for input(s): LIPASE,  AMYLASE in the last 168 hours. No results for input(s): AMMONIA in the last 168 hours. Coagulation Profile: No results for input(s): INR, PROTIME in the last 168 hours. Cardiac Enzymes: No results for input(s): CKTOTAL, CKMB, CKMBINDEX, TROPONINI in the last 168 hours. BNP (last 3 results) No results for input(s): PROBNP in the last 8760 hours. HbA1C: No results for input(s): HGBA1C in the last 72 hours. CBG: No results for input(s): GLUCAP in the last 168 hours. Lipid Profile: No results for input(s): CHOL, HDL,  LDLCALC, TRIG, CHOLHDL, LDLDIRECT in the last 72 hours. Thyroid Function Tests: No results for input(s): TSH, T4TOTAL, FREET4, T3FREE, THYROIDAB in the last 72 hours. Anemia Panel: No results for input(s): VITAMINB12, FOLATE, FERRITIN, TIBC, IRON, RETICCTPCT in the last 72 hours. Sepsis Labs: Recent Labs  Lab 03/15/20 1537 03/15/20 1949 03/16/20 0236 03/17/20 0525  PROCALCITON  --  0.13 <0.10 <0.10  LATICACIDVEN 1.5  --   --   --     Recent Results (from the past 240 hour(s))  Respiratory Panel by RT PCR (Flu A&B, Covid) - Nasopharyngeal Swab     Status: None   Collection Time: 03/15/20 12:13 PM   Specimen: Nasopharyngeal Swab  Result Value Ref Range Status   SARS Coronavirus 2 by RT PCR NEGATIVE NEGATIVE Final    Comment: (NOTE) SARS-CoV-2 target nucleic acids are NOT DETECTED. The SARS-CoV-2 RNA is generally detectable in upper respiratoy specimens during the acute phase of infection. The lowest concentration of SARS-CoV-2 viral copies this assay can detect is 131 copies/mL. A negative result does not preclude SARS-Cov-2 infection and should not be used as the sole basis for treatment or other patient management decisions. A negative result may occur with  improper specimen collection/handling, submission of specimen other than nasopharyngeal swab, presence of viral mutation(s) within the areas targeted by this assay, and inadequate number of viral  copies (<131 copies/mL). A negative result must be combined with clinical observations, patient history, and epidemiological information. The expected result is Negative. Fact Sheet for Patients:  PinkCheek.be Fact Sheet for Healthcare Providers:  GravelBags.it This test is not yet ap proved or cleared by the [place] FDA and  has been authorized for detection and/or diagnosis of SARS-CoV-2 by FDA under an Emergency Use Authorization (EUA). This EUA will remain  in effect (meaning this test can be used) for the duration of the COVID-19 declaration under Section 564(b)(1) of the Act, 21 U.S.C. section 360bbb-3(b)(1), unless the authorization is terminated or revoked sooner.    Influenza A by PCR NEGATIVE NEGATIVE Final   Influenza B by PCR NEGATIVE NEGATIVE Final    Comment: (NOTE) The Xpert Xpress SARS-CoV-2/FLU/RSV assay is intended as an aid in  the diagnosis of influenza from Nasopharyngeal swab specimens and  should not be used as a sole basis for treatment. Nasal washings and  aspirates are unacceptable for Xpert Xpress SARS-CoV-2/FLU/RSV  testing. Fact Sheet for Patients: PinkCheek.be Fact Sheet for Healthcare Providers: GravelBags.it This test is not yet approved or cleared by the [place] FDA and  has been authorized for detection and/or diagnosis of SARS-CoV-2 by  FDA under an Emergency Use Authorization (EUA). This EUA will remain  in effect (meaning this test can be used) for the duration of the  Covid-19 declaration under Section 564(b)(1) of the Act, 21  U.S.C. section 360bbb-3(b)(1), unless the authorization is  terminated or revoked. Performed at [facility], [place] [address] [place], [place] [address]   Culture, blood (routine x 2)     Status: None   Collection Time: 03/15/20 12:13 PM   Specimen: BLOOD  Result Value Ref  Range Status   Specimen Description   Final    BLOOD SITE NOT SPECIFIED Performed at [place] [address], [place], [place] [address]    Special Requests   Final    BOTTLES DRAWN AEROBIC AND ANAEROBIC Blood Culture adequate volume Performed at [place] [address], [place], [place] [address]  Culture   Final    NO GROWTH 5 DAYS Performed at [facility], [place] [address] [place], [place] [address]    Report Status 03/20/2020 FINAL  Final  Culture, blood (routine x 2)     Status: None   Collection Time: 03/15/20 12:13 PM   Specimen: BLOOD  Result Value Ref Range Status   Specimen Description   Final    BLOOD SITE NOT SPECIFIED Performed at [place] [address] [place], [place] [address]    Special Requests   Final    BOTTLES DRAWN AEROBIC AND ANAEROBIC Blood Culture adequate volume Performed at [place] [address], [place], [place] [address]    Culture   Final    NO GROWTH 5 DAYS Performed at [facility], [place] [address] [place], [place] [address]    Report Status 03/20/2020 FINAL  Final  MRSA PCR Screening     Status: None   Collection Time: 03/15/20  9:22 PM   Specimen: Nasal Mucosa; Nasopharyngeal  Result Value Ref Range Status   MRSA by PCR NEGATIVE NEGATIVE Final    Comment:        The GeneXpert MRSA Assay (FDA approved for NASAL specimens only), is one component of a comprehensive MRSA colonization surveillance program. It is not intended to diagnose MRSA infection nor to guide or monitor treatment for MRSA infections. Performed at [facility], [place] [address] [place], [place] [address]   Culture, body fluid-bottle     Status: None   Collection Time: 03/16/20  4:06 PM   Specimen: Fluid  Result Value Ref Range Status   Specimen Description FLUID PLEURAL  Final   Special Requests   Final    BOTTLES DRAWN AEROBIC AND  ANAEROBIC Blood Culture adequate volume   Culture   Final    NO GROWTH 5 DAYS Performed at [facility], [place] [address], [place], [place] [address]    Report Status 03/21/2020 FINAL  Final  Gram stain     Status: None   Collection Time: 03/16/20  4:06 PM   Specimen: Fluid  Result Value Ref Range Status   Specimen Description FLUID PLEURAL  Final   Special Requests SYRINGE  Final   Gram Stain   Final    MODERATE WBC PRESENT, PREDOMINANTLY MONONUCLEAR NO ORGANISMS SEEN Performed at [facility], [place] [address] [place],  [address]    Report Status 03/16/2020 FINAL  Final         Radiology Studies: CT CORONARY MORPH W/CTA COR W/SCORE W/CA W/CM &/OR WO/CM  Result Date: 03/22/2020 EXAM: OVER-READ INTERPRETATION  CT CHEST The following report is an over-read performed by radiologist Dr. Salvatore Marvel of [facility], [place] on 03/22/2020. This over-read does not include interpretation of cardiac or coronary anatomy or pathology. The coronary CTA interpretation by the cardiologist is attached. COMPARISON:  03/15/2020 chest CT. FINDINGS: Please see the separate concurrent chest CT angiogram report for details. IMPRESSION: Please see the separate concurrent chest CT angiogram report for details. Electronically Signed   By: Ilona Sorrel M.D.   On: 03/22/2020 11:40   CT ANGIO CHEST AORTA W/CM & OR WO/CM  Result Date: 03/22/2020 CLINICAL DATA:  Inpatient. Pre-TAVR evaluation. Severe aortic stenosis with chronic systolic heart failure. 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 EXAM: CT ANGIOGRAPHY CHEST, ABDOMEN AND PELVIS TECHNIQUE: Non-contrast CT of the chest was initially obtained. Multidetector  CT imaging through the chest, abdomen and pelvis was performed using the standard protocol during bolus administration of intravenous contrast. Multiplanar reconstructed images and MIPs were obtained and reviewed to evaluate the vascular anatomy.  CONTRAST:  133mL OMNIPAQUE IOHEXOL 350 MG/ML SOLN COMPARISON:  03/15/2020 chest CT. 12/11/2018 PET-CT. FINDINGS: CTA CHEST FINDINGS Cardiovascular: Mild cardiomegaly. Diffuse thickening and coarse calcification of the aortic valve. Small pericardial effusion, similar. Right internal jugular Port-A-Cath terminates in the lower third of the SVC. Left anterior descending and right coronary atherosclerosis. Atherosclerotic nonaneurysmal thoracic aorta. Normal caliber pulmonary arteries. No central pulmonary emboli. Mediastinum/Nodes: No discrete thyroid nodules. Unremarkable esophagus. No pathologically enlarged axillary, mediastinal or hilar lymph nodes. Lungs/Pleura: No pneumothorax. Small to moderate dependent bilateral pleural effusions, unchanged. Moderate to severe centrilobular emphysema with diffuse bronchial wall thickening. Sharply marginated lower left perihilar consolidation with associated mild bronchiectasis, volume loss and distortion, unchanged, compatible with postradiation change. Moderate passive atelectasis in the dependent lower lobes bilaterally. No acute interval consolidation. No significant pulmonary nodules. Musculoskeletal: No aggressive appearing focal osseous lesions. Chronic severe T7 vertebral compression fracture. Mild thoracic spondylosis. CTA ABDOMEN AND PELVIS FINDINGS Hepatobiliary: Normal liver size. A few scattered subcentimeter hypodense liver lesions are too small to characterize and are not appreciably changed since 12/11/2018 PET-CT, considered benign. No new liver lesions. Normal gallbladder with no radiopaque cholelithiasis. No biliary ductal dilatation. Pancreas: Normal, with no mass or duct dilation. Spleen: Normal size. No mass. Adrenals/Urinary Tract: Normal adrenals. No hydronephrosis. Exophytic hyperdense 2.8 cm renal cortical lesion in the medial upper left kidney (series 9/image 105) and exophytic isodense 2.2 cm renal cortical lesion in the far upper left kidney  (series 9/image 97), neither appreciably changed size since 12/11/2018 PET-CT. Numerous simple renal cortical cysts in both kidneys, largest an exophytic simple 6.3 cm renal cortical cyst in the posterior lower left kidney. Numerous subcentimeter hypodense renal cortical lesions in both kidneys are too small to characterize. Normal bladder. Stomach/Bowel: Normal non-distended stomach. Normal caliber small bowel with no small bowel wall thickening. Appendix not discretely visualized. Marked sigmoid diverticulosis with no large bowel wall thickening or significant pericolonic fat stranding. Vascular/Lymphatic: Atherosclerotic abdominal aorta with ectatic 2.5 cm infrarenal abdominal aorta. No pathologically enlarged lymph nodes in the abdomen or pelvis. Reproductive: Mildly enlarged prostate. Other: No pneumoperitoneum, ascites or focal fluid collection. Musculoskeletal: No aggressive appearing focal osseous lesions. Mild lumbar spondylosis. VASCULAR MEASUREMENTS PERTINENT TO TAVR: AORTA: Minimal Aortic Diameter-13.2 x 10.0 mm Severity of Aortic Calcification-moderate to severe RIGHT PELVIS: Right Common Iliac Artery - Minimal Diameter-8.0 x 4.1 mm Tortuosity-moderate Calcification-severe Right External Iliac Artery - Minimal Diameter-6.5 x 4.7 mm Tortuosity-mild Calcification-moderate Right Common Femoral Artery - Minimal Diameter-5.9 x 3.5 mm Tortuosity-mild Calcification-severe LEFT PELVIS: Left Common Iliac Artery - Minimal Diameter-6.8 x 5.7 mm Tortuosity-mild Calcification-severe Left External Iliac Artery - Minimal Diameter-5.3 x 4.2 mm Tortuosity-mild-to-moderate Calcification-severe Left Common Femoral Artery - Minimal Diameter-6.2 x 4.7 mm Tortuosity-mild Calcification-severe Review of the MIP images confirms the above findings. IMPRESSION: 1. Vascular findings and measurements pertinent to potential TAVR procedure, as detailed. 2. Diffuse thickening and coarse calcification of the aortic valve, compatible  with the reported history of severe aortic stenosis. 3. Stable postradiation change in the lower left perihilar lung. No evidence of local tumor recurrence. No findings of metastatic disease. 4. Stable small to moderate dependent bilateral pleural effusions. 5. Mild cardiomegaly. Small pericardial effusion, similar. Two-vessel coronary atherosclerosis. 6. Marked sigmoid diverticulosis. 7. Mildly enlarged prostate. 8. Aortic Atherosclerosis (ICD10-I70.0) and Emphysema (ICD10-J43.9).  Electronically Signed   By: Ilona Sorrel M.D.   On: 03/22/2020 14:03   CT Angio Abd/Pel w/ and/or w/o  Result Date: 03/22/2020 CLINICAL DATA:  Inpatient. Pre-TAVR evaluation. Severe aortic stenosis with chronic systolic heart failure. 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 EXAM: CT ANGIOGRAPHY CHEST, ABDOMEN AND PELVIS TECHNIQUE: Non-contrast CT of the chest was initially obtained. Multidetector CT imaging through the chest, abdomen and pelvis was performed using the standard protocol during bolus administration of intravenous contrast. Multiplanar reconstructed images and MIPs were obtained and reviewed to evaluate the vascular anatomy. CONTRAST:  140mL OMNIPAQUE IOHEXOL 350 MG/ML SOLN COMPARISON:  03/15/2020 chest CT. 12/11/2018 PET-CT. FINDINGS: CTA CHEST FINDINGS Cardiovascular: Mild cardiomegaly. Diffuse thickening and coarse calcification of the aortic valve. Small pericardial effusion, similar. Right internal jugular Port-A-Cath terminates in the lower third of the SVC. Left anterior descending and right coronary atherosclerosis. Atherosclerotic nonaneurysmal thoracic aorta. Normal caliber pulmonary arteries. No central pulmonary emboli. Mediastinum/Nodes: No discrete thyroid nodules. Unremarkable esophagus. No pathologically enlarged axillary, mediastinal or hilar lymph nodes. Lungs/Pleura: No pneumothorax. Small to moderate dependent bilateral pleural effusions, unchanged. Moderate to  severe centrilobular emphysema with diffuse bronchial wall thickening. Sharply marginated lower left perihilar consolidation with associated mild bronchiectasis, volume loss and distortion, unchanged, compatible with postradiation change. Moderate passive atelectasis in the dependent lower lobes bilaterally. No acute interval consolidation. No significant pulmonary nodules. Musculoskeletal: No aggressive appearing focal osseous lesions. Chronic severe T7 vertebral compression fracture. Mild thoracic spondylosis. CTA ABDOMEN AND PELVIS FINDINGS Hepatobiliary: Normal liver size. A few scattered subcentimeter hypodense liver lesions are too small to characterize and are not appreciably changed since 12/11/2018 PET-CT, considered benign. No new liver lesions. Normal gallbladder with no radiopaque cholelithiasis. No biliary ductal dilatation. Pancreas: Normal, with no mass or duct dilation. Spleen: Normal size. No mass. Adrenals/Urinary Tract: Normal adrenals. No hydronephrosis. Exophytic hyperdense 2.8 cm renal cortical lesion in the medial upper left kidney (series 9/image 105) and exophytic isodense 2.2 cm renal cortical lesion in the far upper left kidney (series 9/image 97), neither appreciably changed size since 12/11/2018 PET-CT. Numerous simple renal cortical cysts in both kidneys, largest an exophytic simple 6.3 cm renal cortical cyst in the posterior lower left kidney. Numerous subcentimeter hypodense renal cortical lesions in both kidneys are too small to characterize. Normal bladder. Stomach/Bowel: Normal non-distended stomach. Normal caliber small bowel with no small bowel wall thickening. Appendix not discretely visualized. Marked sigmoid diverticulosis with no large bowel wall thickening or significant pericolonic fat stranding. Vascular/Lymphatic: Atherosclerotic abdominal aorta with ectatic 2.5 cm infrarenal abdominal aorta. No pathologically enlarged lymph nodes in the abdomen or pelvis. Reproductive:  Mildly enlarged prostate. Other: No pneumoperitoneum, ascites or focal fluid collection. Musculoskeletal: No aggressive appearing focal osseous lesions. Mild lumbar spondylosis. VASCULAR MEASUREMENTS PERTINENT TO TAVR: AORTA: Minimal Aortic Diameter-13.2 x 10.0 mm Severity of Aortic Calcification-moderate to severe RIGHT PELVIS: Right Common Iliac Artery - Minimal Diameter-8.0 x 4.1 mm Tortuosity-moderate Calcification-severe Right External Iliac Artery - Minimal Diameter-6.5 x 4.7 mm Tortuosity-mild Calcification-moderate Right Common Femoral Artery - Minimal Diameter-5.9 x 3.5 mm Tortuosity-mild Calcification-severe LEFT PELVIS: Left Common Iliac Artery - Minimal Diameter-6.8 x 5.7 mm Tortuosity-mild Calcification-severe Left External Iliac Artery - Minimal Diameter-5.3 x 4.2 mm Tortuosity-mild-to-moderate Calcification-severe Left Common Femoral Artery - Minimal Diameter-6.2 x 4.7 mm Tortuosity-mild Calcification-severe Review of the MIP images confirms the above findings. IMPRESSION: 1. Vascular findings and measurements pertinent to potential TAVR procedure, as detailed. 2. Diffuse thickening and coarse calcification of  the aortic valve, compatible with the reported history of severe aortic stenosis. 3. Stable postradiation change in the lower left perihilar lung. No evidence of local tumor recurrence. No findings of metastatic disease. 4. Stable small to moderate dependent bilateral pleural effusions. 5. Mild cardiomegaly. Small pericardial effusion, similar. Two-vessel coronary atherosclerosis. 6. Marked sigmoid diverticulosis. 7. Mildly enlarged prostate. 8. Aortic Atherosclerosis (ICD10-I70.0) and Emphysema (ICD10-J43.9). Electronically Signed   By: Ilona Sorrel M.D.   On: 03/22/2020 14:03        Scheduled Meds: . arformoterol  15 mcg Nebulization BID  . aspirin  81 mg Oral Daily  . budesonide (PULMICORT) nebulizer solution  0.5 mg Nebulization BID  . chlorhexidine  15 mL Mouth Rinse BID  .  Chlorhexidine Gluconate Cloth  6 each Topical Daily  . docusate sodium  100 mg Oral BID  . furosemide  40 mg Intravenous Q12H  . heparin injection (subcutaneous)  5,000 Units Subcutaneous Q8H  . ipratropium-albuterol  3 mL Nebulization TID  . mouth rinse  15 mL Mouth Rinse q12n4p  . pantoprazole  40 mg Oral BID  . polycarbophil  625 mg Oral BID  . predniSONE  20 mg Oral Q breakfast  . sodium chloride flush  10-40 mL Intracatheter Q12H  . sodium chloride flush  3 mL Intravenous Q12H  . sodium chloride flush  3 mL Intravenous Q12H  . tamsulosin  0.4 mg Oral Daily   Continuous Infusions: . sodium chloride       LOS: 7 days       Phillips Climes, MD [facility]   If 7PM-7AM, please contact night-coverage [email or web address]  03/22/2020, 2:46 PM

## 2020-03-22 NOTE — Progress Notes (Signed)
Occupational Therapy Treatment Patient Details Name: Robert Dixon MRN: [id number] DOB: 1941/05/09 Today's Date: 03/22/2020    History of present illness 79 y/o male with advanced non-small cell lung cancer and aortic stenosis admitted 03/16/20 for acute on chronic repiratory failure with hypoxemia in the setting of a recurrent left sided pleural effusion. On Home oxygen 3 L.   OT comments  Pt making progress in therapy, demonstrating improved activity tolerance and independence with self-care and functional transfer tasks. Continued education with pt on energy conservation strategies and fall prevention with fair understanding and follow through. Pt continues to require cues and assist to manage O2 tubing. Pt able to ambulate to/from bathroom with RW and min guard. 0 instances of LOB. Pt completed toileting task and walk-in shower transfer with min guard and cues for safety. Pt tolerated standing at the sink 4 min to complete grooming/hygiene tasks. No signs/symptoms of distress with SpO2 maintaining in 90s on 3.5L [place]. OT will continue to follow acutely.    Follow Up Recommendations  Home health OT;Supervision/Assistance - 24 hour    Equipment Recommendations  None recommended by OT    Recommendations for Other Services      Precautions / Restrictions Precautions Precautions: Fall Precaution Comments: monitor sats, HR/ BP soft       Mobility Bed Mobility Overal bed mobility: Needs Assistance Bed Mobility: Supine to Sit;Sit to Supine     Supine to sit: Supervision;HOB elevated Sit to supine: Supervision   General bed mobility comments: Assist for line management  Transfers Overall transfer level: Needs assistance Equipment used: Rolling walker (2 wheeled) Transfers: Sit to/from Stand Sit to Stand: Min guard         General transfer comment: To ensure balance and safety. Cues for hand placement    Balance Overall balance assessment: Needs assistance Sitting-balance  support: Feet supported Sitting balance-Leahy Scale: Good       Standing balance-Leahy Scale: Fair                             ADL either performed or assessed with clinical judgement   ADL Overall ADL's : Needs assistance/impaired     Grooming: Wash/dry hands;Wash/dry face;Oral care;Set up;Supervision/safety;Standing Grooming Details (indicate cue type and reason): While standing at the sink for 4 minutes.                  Toilet Transfer: Min guard;Ambulation;Regular Toilet;Grab bars   Toileting- Clothing Manipulation and Hygiene: Min guard;Sit to/from stand   Tub/ Shower Transfer: Min guard;Walk-in shower;Grab bars Tub/Shower Transfer Details (indicate cue type and reason): Cues for safety Functional mobility during ADLs: Min guard;Rolling walker General ADL Comments: Pt able to ambulate to/from bathroom with RW and min guard. 0 instances of LOB. Pt tolerated standing 4 min at the sink to complete grooming/hygiene tasks.      Vision       Perception     Praxis      Cognition Arousal/Alertness: Awake/alert Behavior During Therapy: WFL for tasks assessed/performed Overall Cognitive Status: Within Functional Limits for tasks assessed                                 General Comments: Pt pleasant and motivated to participate in therapy        Exercises     Shoulder Instructions       General Comments SpO2 maintained  in 90s throughout on 3.5L [place].     Pertinent Vitals/ Pain       Pain Assessment: No/denies pain  Home Living                                          Prior Functioning/Environment              Frequency           Progress Toward Goals  OT Goals(current goals can now be found in the care plan section)  Progress towards OT goals: Progressing toward goals  ADL Goals Pt Will Perform Grooming: with modified independence;standing;sitting Pt Will Perform Lower Body Dressing: with  modified independence;sit to/from stand;sitting/lateral leans Pt Will Transfer to Toilet: with modified independence;ambulating;grab bars Pt Will Perform Toileting - Clothing Manipulation and hygiene: with modified independence;sitting/lateral leans;sit to/from stand Additional ADL Goal #1: Patient will identify 3 energy conservation strategies to implement at home in order to maximize safety and independence with self care.  Plan Discharge plan remains appropriate    Co-evaluation                 AM-PAC OT "6 Clicks" Daily Activity     Outcome Measure   Help from another person eating meals?: None Help from another person taking care of personal grooming?: A Little Help from another person toileting, which includes using toliet, bedpan, or urinal?: A Little Help from another person bathing (including washing, rinsing, drying)?: A Little Help from another person to put on and taking off regular upper body clothing?: None Help from another person to put on and taking off regular lower body clothing?: A Little 6 Click Score: 20    End of Session Equipment Utilized During Treatment: Gait belt;Rolling walker;Oxygen  OT Visit Diagnosis: Unsteadiness on feet (R26.81);Muscle weakness (generalized) (M62.81)   Activity Tolerance Patient tolerated treatment well   Patient Left in bed;with call bell/phone within reach   Nurse Communication Mobility status        Time: 8811-0315 OT Time Calculation (min): 26 min  Charges: OT General Charges $OT Visit: 1 Visit OT Treatments $Self Care/Home Management : 8-22 mins $Therapeutic Activity: 8-22 mins  Mauri Brooklyn OTR/L [phone]   Mauri Brooklyn 03/22/2020, 4:26 PM

## 2020-03-22 NOTE — Care Management Important Message (Signed)
Important Message  Patient Details  Name: Robert Dixon MRN: [id number] Date of Birth: [birth year]   Medicare Important Message Given:  Yes     [name] 03/22/2020, 9:01 AM

## 2020-03-22 NOTE — Progress Notes (Signed)
Patient care plan reviewed. Patient A&O x4. No acute change or signs of distress. Patient denies pain. VS stable. BP 101/74 (82). HR 87. NSR, occasionally sinus tach on the monitor. RR 19. SpO2 97% on 4L [place]. Will continue to monitor.  Adella Hare, RN

## 2020-03-22 NOTE — Consult Note (Addendum)
[facility]  Inpatient TAVR Consultation:   Patient ID: Robert Dixon; [id number]; [birth year]   Admit date: 03/15/2020 Date of Consult: 03/23/2020  Primary Care Provider: [name], MD Primary Cardiologist: Dr. Percival Spanish   Patient Profile:   Robert Dixon is a 79 y.o. male with a hx of COPD on home 2-3 home 02, HTN, stage IIIb NSCLC s/p chemo/XRT and currently on immunotherapy, GERD, 1st deg AV block, pericardial effusion and severe AS who is being seen today for the evaluation of new LV dysfunction and severe LF LG AS and consideration of TAVR at the request of Dr Burt Knack.  History of Present Illness:   Robert Dixon lives in [place]. He is widowed. His wife had TAVR in 2018, but passed shortly after that. 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 He lives alone in [place]. He has two sons that are local. He continues to drive and takes care of all his own ADLs. He has dental bridges and has had routine dental care until the Covid 19 pandemic. He is unaware of any current dental issues.   He has been followed by Dr. Percival Spanish for pericardial effusion and severe aortic stenosis. His last echocardiogram 11/2019 showed EF 60-65%, mild LVH, moderate pericardial effusion (similar to previous 08/2019), moderate MAC, and severe aortic stenosis with mean gradient 37.8.  He was last seen by Dr. Percival Spanish in the office on 02/05/20. He was doing okay at that time. Plans were made for repeat echo in July and conservative management of his AS until he finished his chemotherapy and had his follow up scans.   The patient was diagnosed with lung cancer about 18 months ago.  He is felt to have stage IIIb non-small cell lung cancer. He was initially treated with chemotherapy and radiation therapy.  He has just recently completed a long course of immunotherapy and is felt to demonstrate clinical stability  with respect to his cancer.  He has not had any documented distant metastases.  He has had chronic dyspnea. He was in his usual state of health until 4/19 when he developed progressive symptoms of orthopnea and resting shortness of breath prompting him to seek care in the ER. He was found to have an elevated BNP and pulmonary vascular congestion with bilateral pleural effusions on CXR. He was started on IV lasix and underwent left thoracentesis on 4/24 yielding 1.1L of transudate fluid. Repeat echo during this admission showed new severe LV dysfunction with EF 30-35%, severe AS with a mean gradient of 31 mm Hg, peak gradient 44.6 mm Hg, AVA 1.05 cm2, DVI 0.31, mild AI, as well as a small pericardial effusion. He underwent L/RHC on 4/23 which showed calcific non obstructive CAD with preserved CO (7.56 L/min) and CI (4.16). Valve was not able to be crossed. Right heart cath showed hemodynamics congestive heart failure with pulmonary capillary wedge pressure 31 mmHg, PA pressure 52/29 with a mean of 40 mm Hg. 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  His gated cardiac CTA shows anatomy suitable for TAVR using a 26 mm Edwards Sapien 3 Ultra valve.  His abdominal and pelvic CTA shows borderline pelvic vascular anatomy to allow transfemoral insertion on the left.   He is currently feeling much better after IV diuresis. He has chronic shortness of breath. He mostly limited  by his COPD but over the past several months he has has progressive dyspnea to the point that he cannot walk to the bathroom without stopping to rest. He has had shortness of breath at rest. He has had orthopnea. His only relief was sitting up on the edge of the bed to catch his breath. No LE edema. No chest pain. No syncope but he often feels dizzy when he stands up too quickly.    Past Medical History:  Diagnosis Date  . Complication of anesthesia    . COPD (chronic obstructive pulmonary disease) ([place])   . GERD (gastroesophageal reflux disease)   . Hemoptysis 11/20/2018  . History of hiatal hernia   . Hypertension   . 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 Migraine with visual aura   . Non-small cell lung cancer ([place]) dx'd 11/20/18  . Pneumonia 11/20/2018  . PONV (postoperative nausea and vomiting)   . Pulmonary nodule 11/20/2018  . Skin cancer     Past Surgical History:  Procedure Laterality Date  . COLONOSCOPY W/ POLYPECTOMY    . IR IMAGING GUIDED PORT INSERTION  03/14/2019  . RIGHT HEART CATH AND CORONARY ANGIOGRAPHY N/A 03/19/2020   Procedure: RIGHT HEART CATH AND CORONARY ANGIOGRAPHY;  Surgeon: [name], MD;  Location: [facility];  Service: Cardiovascular;  Laterality: N/A;  . TONSILLECTOMY    . VASECTOMY    . VIDEO BRONCHOSCOPY WITH ENDOBRONCHIAL ULTRASOUND N/A 12/06/2018   Procedure: VIDEO BRONCHOSCOPY WITH ENDOBRONCHIAL ULTRASOUND;  Surgeon: Garner Nash, DO;  Location: [facility];  Service: Thoracic;  Laterality: N/A;     Inpatient Medications: Scheduled Meds: . arformoterol  15 mcg Nebulization BID  . aspirin  81 mg Oral Daily  . budesonide (PULMICORT) nebulizer solution  0.5 mg Nebulization BID  . chlorhexidine  15 mL Mouth Rinse BID  . Chlorhexidine Gluconate Cloth  6 each Topical Daily  . docusate sodium  100 mg Oral BID  . furosemide  40 mg Oral Daily  . heparin injection (subcutaneous)  5,000 Units Subcutaneous Q8H  . mouth rinse  15 mL Mouth Rinse q12n4p  . pantoprazole  40 mg Oral BID  . polycarbophil  625 mg Oral BID  . potassium chloride  40 mEq Oral Q4H  . sodium chloride flush  10-40 mL Intracatheter Q12H  . sodium chloride flush  3 mL Intravenous Q12H  . sodium chloride flush  3 mL Intravenous Q12H  . tamsulosin  0.4 mg Oral Daily   Continuous Infusions: . sodium chloride     PRN Meds: sodium chloride, acetaminophen **OR** acetaminophen, albuterol, alum & mag hydroxide-simeth,  dimenhyDRINATE, guaiFENesin-dextromethorphan, lip balm, ondansetron **OR** ondansetron (ZOFRAN) IV, polyethylene glycol, sodium chloride flush, sodium chloride flush, traMADol  Allergies:   No Known Allergies  Social History:   Social History   Socioeconomic History  . Marital status: Widowed    Spouse name: Not on file  . Number of children: Not on file  . Years of education: Not on file  . Highest education level: Not on file  Occupational History  . Not on file  Tobacco Use  . Smoking status: Former Smoker    Years: 30.00    Types: Cigarettes    Quit date: 2000    Years since quitting: 21.3  . Smokeless tobacco: Never Used  Substance and Sexual Activity  . Alcohol use: No  . Drug use: No  . Sexual activity: Not on file  Other Topics Concern  . Not on file  Social  History Narrative   Lives alone.  Two sons.  Widower.     Social Determinants of Health   Financial Resource Strain:   . Difficulty of Paying Living Expenses:   Food Insecurity:   . Worried About Charity fundraiser in the Last Year:   . Arboriculturist in the Last Year:   Transportation Needs:   . Film/video editor (Medical):   Marland Kitchen Lack of Transportation (Non-Medical):   Physical Activity:   . Days of Exercise per Week:   . Minutes of Exercise per Session:   Stress:   . Feeling of Stress :   Social Connections:   . Frequency of Communication with Friends and Family:   . Frequency of Social Gatherings with Friends and Family:   . Attends Religious Services:   . Active Member of Clubs or Organizations:   . Attends Archivist Meetings:   Marland Kitchen Marital Status:   Intimate Partner Violence:   . Fear of Current or Ex-Partner:   . Emotionally Abused:   Marland Kitchen Physically Abused:   . Sexually Abused:     Family History:   The patient's family history includes CVA in his father; Cancer - Cervical in his sister; Cancer - Lung in his mother; Hypertension in his father.  ROS:  Please see the history  of present illness.  Review of Systems    All other ROS reviewed and negative.     Physical Exam/Data:   Vitals:   03/23/20 0617 03/23/20 0828 03/23/20 0829 03/23/20 0832  BP:    102/67  Pulse:    99  Resp: 20   20  Temp:      TempSrc:    Oral  SpO2:  97% 97% 96%  Weight:      Height:        Intake/Output Summary (Last 24 hours) at 03/23/2020 0911 Last data filed at 03/23/2020 0210 Gross per 24 hour  Intake 600 ml  Output 2075 ml  Net -1475 ml   Filed Weights   03/21/20 0448 03/22/20 0432 03/23/20 0614  Weight: 69.7 kg 68.8 kg 68.6 kg   Body mass index is 23 kg/m.  General:  Well nourished, well developed, in no acute distress HEENT: normal Lymph: no adenopathy Neck: no JVD Endocrine:  No thryoid Cardiac:  Tachy. Distant heart sounds. 2/6 harsh SEM  Lungs:  Decreased breath sounds at bases Abd: soft, nontender, no hepatomegaly  Ext: no edema Musculoskeletal:  No deformities, BUE and BLE strength normal and equal Skin: warm and dry  Neuro:  CNs 2-12 intact, no focal abnormalities noted Psych:  Normal affect   EKG:  The EKG was personally reviewed and demonstrates:  Junctional tachy HR 126 bpm Telemetry:  Telemetry was personally reviewed and demonstrates:  Sinus tachy with short run of NSVT and SVT  Relevant CV Studies:  03/16/20 IMPRESSIONS  1. Hypokinesis of the distal anteroseptal wall and apex; overall moderate to severe LV dysfunction; calcified aortic valve with probable severe AS (mean gradient 31 mmHg; AVA 1 cm2) and mild AI; mild MR; mild LAE; small pericardial effusion with RA inversion.  2. Left ventricular ejection fraction, by estimation, is 30 to 35%. The  left ventricle has moderate to severely decreased function. The left  ventricle has no regional wall motion abnormalities. The left ventricular  internal cavity size was mildly  dilated. There is mild left ventricular hypertrophy. Left ventricular  diastolic parameters are indeterminate.  Elevated left atrial pressure.  3.  Right ventricular systolic function is normal. The right ventricular  size is normal. There is mildly elevated pulmonary artery systolic  pressure.  4. Left atrial size was mildly dilated.  5. The mitral valve is normal in structure. Mild mitral valve  regurgitation. No evidence of mitral stenosis.  6. The aortic valve has an indeterminant number of cusps. Aortic valve  regurgitation is mild. No aortic stenosis is present.   FINDINGS  Left Ventricle: Left ventricular ejection fraction, by estimation, is 30  to 35%. The left ventricle has moderate to severely decreased function.  The left ventricle has no regional wall motion abnormalities. The left  ventricular internal cavity size was  mildly dilated. There is mild left ventricular hypertrophy. Left  ventricular diastolic parameters are indeterminate. Elevated left atrial  pressure.   Right Ventricle: The right ventricular size is normal. Right ventricular  systolic function is normal. There is mildly elevated pulmonary artery  systolic pressure. The tricuspid regurgitant velocity is 2.49 m/s, and  with an assumed right atrial pressure  of 8 mmHg, the estimated right ventricular systolic pressure is 48.0 mmHg.   Left Atrium: Left atrial size was mildly dilated.   Right Atrium: Right atrial size was normal in size.   Pericardium: A small pericardial effusion is present.   Mitral Valve: The mitral valve is normal in structure. Normal mobility of  the mitral valve leaflets. Severe mitral annular calcification. Mild  mitral valve regurgitation. No evidence of mitral valve stenosis. MV peak  gradient, 13.4 mmHg. The mean mitral  valve gradient is 6.0 mmHg.   Tricuspid Valve: The tricuspid valve is normal in structure. Tricuspid  valve regurgitation is trivial. No evidence of tricuspid stenosis.   Aortic Valve: The aortic valve has an indeterminant number of cusps.  Aortic valve  regurgitation is mild. Aortic regurgitation PHT measures 210  msec. No aortic stenosis is present. Aortic valve mean gradient measures  31.0 mmHg. Aortic valve peak gradient  measures 44.6 mmHg. Aortic valve area, by VTI measures 1.06 cm.   Pulmonic Valve: The pulmonic valve was normal in structure. Pulmonic valve  regurgitation is not visualized. No evidence of pulmonic stenosis.   Aorta: The aortic root is normal in size and structure.   Venous: The inferior vena cava was not well visualized.    Additional Comments: Hypokinesis of the distal anteroseptal wall and apex;  overall moderate to severe LV dysfunction; calcified aortic valve with  probable severe AS (mean gradient 31 mmHg; AVA 1 cm2) and mild AI; mild  MR; mild LAE; small pericardial  effusion with RA inversion.     LEFT VENTRICLE  PLAX 2D  LVIDd:     5.20 cm Diastology  LVIDs:     4.60 cm LV e' lateral:  11.00 cm/s  LV PW:     1.20 cm LV E/e' lateral: 15.5  LV IVS:    1.20 cm LV e' medial:  7.29 cm/s  LVOT diam:   2.10 cm LV E/e' medial: 23.5  LV SV:     77  LV SV Index:  41  LVOT Area:   3.46 cm     RIGHT VENTRICLE  RV Basal diam: 2.50 cm  RV S prime:   5.11 cm/s  TAPSE (M-mode): 2.1 cm   LEFT ATRIUM       Index    RIGHT ATRIUM      Index  LA diam:    4.30 cm 2.29 cm/m RA Area:   16.40 cm  LA Vol (  A2C):  68.8 ml 36.69 ml/m RA Volume:  47.10 ml 25.12 ml/m  LA Vol (A4C):  65.0 ml 34.67 ml/m  LA Biplane Vol: 67.5 ml 36.00 ml/m  AORTIC VALVE  AV Area (Vmax):  1.33 cm  AV Area (Vmean):  1.05 cm  AV Area (VTI):   1.06 cm  AV Vmax:      334.00 cm/s  AV Vmean:     235.500 cm/s  AV VTI:      0.725 m  AV Peak Grad:   44.6 mmHg  AV Mean Grad:   31.0 mmHg  LVOT Vmax:     128.00 cm/s  LVOT Vmean:    71.200 cm/s  LVOT VTI:     0.222 m  LVOT/AV VTI ratio: 0.31  AI PHT:      210 msec     AORTA  Ao Root diam: 3.00 cm   MITRAL VALVE        TRICUSPID VALVE  MV Area (PHT): 3.37 cm   TR Peak grad:  24.8 mmHg  MV Peak grad: 13.4 mmHg  TR Vmax:    249.00 cm/s  MV Mean grad: 6.0 mmHg  MV Vmax:    1.83 m/s   SHUNTS  MV Vmean:   113.0 cm/s  Systemic VTI: 0.22 m  MV Decel Time: 225 msec   Systemic Diam: 2.10 cm  MV E velocity: 171.00 cm/s  MV A velocity: 27.70 cm/s  MV E/A ratio: 6.17  ________________  03/19/20 RIGHT HEART CATH AND CORONARY ANGIOGRAPHY  Conclusion 1.  Calcific, nonobstructive coronary artery disease as outlined 2.  Preserved cardiac output of 7.56 L/min and cardiac index 4.16 3.  Known severe calcific aortic stenosis (stage D2 low-flow low gradient) with inability to cross the aortic valve with a wire 4.  Intracardiac hemodynamics suggestive of congestive heart failure with pulmonary capillary wedge pressure 31 mmHg, PA pressure 52/29 with a mean of 40  Recommend: IV diuresis and continued treatment of congestive heart failure, formal structural heart consultation for consideration of TAVR.  This will have to be weighed in the context of the patient's known comorbidities.   _____________  Carotid dopplers 03/22/20 Right Carotid: Evidence consistent with a total occlusion of the right ICA. Left Carotid: Velocities in the left ICA are consistent with a 1-39% stenosis. Vertebrals: Bilateral vertebral arteries demonstrate antegrade flow  ______________   Cardiac CT 03/22/20  IMPRESSION: 1. Trileaflet aortic valve with severely thickened and calcified leaflets and no calcifications extending into the LVOT. Aortic valve calcium score is 3216 consistent with severe aortic stenosis. Annular measurements suitable for delivery of a 26 mm Edwards-SAPIEN 3 Ultra valve.  2. Sufficient coronary to annulus distance.  3. Optimum Fluoroscopic Angle for Delivery: LAO 12 CAU 13.  4. No thrombus in the left atrial  appendage.  ______________   CT Angio chest/abd/pelvis 03/22/20 VASCULAR MEASUREMENTS PERTINENT TO TAVR:  AORTA:  Minimal Aortic Diameter-13.2 x 10.0 mm  Severity of Aortic Calcification-moderate to severe  RIGHT PELVIS:  Right Common Iliac Artery -  Minimal Diameter-8.0 x 4.1 mm  Tortuosity-moderate  Calcification-severe  Right External Iliac Artery -  Minimal Diameter-6.5 x 4.7 mm  Tortuosity-mild  Calcification-moderate  Right Common Femoral Artery -  Minimal Diameter-5.9 x 3.5 mm  Tortuosity-mild  Calcification-severe  LEFT PELVIS:  Left Common Iliac Artery -  Minimal Diameter-6.8 x 5.7 mm  Tortuosity-mild  Calcification-severe  Left External Iliac Artery -  Minimal Diameter-5.3 x 4.2 mm  Tortuosity-mild-to-moderate  Calcification-severe  Left Common Femoral  Artery -  Minimal Diameter-6.2 x 4.7 mm  Tortuosity-mild  Calcification-severe  Review of the MIP images confirms the above findings.  IMPRESSION: 1. Vascular findings and measurements pertinent to potential TAVR procedure, as detailed. 2. Diffuse thickening and coarse calcification of the aortic valve, compatible with the reported history of severe aortic stenosis. 3. Stable postradiation change in the lower left perihilar lung. No evidence of local tumor recurrence. No findings of metastatic disease. 4. Stable small to moderate dependent bilateral pleural effusions. 5. Mild cardiomegaly. Small pericardial effusion, similar. Two-vessel coronary atherosclerosis. 6. Marked sigmoid diverticulosis. 7. Mildly enlarged prostate. 8. Aortic Atherosclerosis (ICD10-I70.0) and Emphysema (ICD10-J43.9).   Laboratory Data:  Chemistry Recent Labs  Lab 03/21/20 0543 03/22/20 0445 03/23/20 0309  NA 143 142 135  K 4.1 3.7 3.1*  CL 102 100 95*  CO2 32 34* 31  GLUCOSE 123* 130* 179*  BUN 42* 41* 35*  CREATININE 1.21 1.24 1.31*  CALCIUM 8.6* 8.3*  8.3*  GFRNONAA 57* 55* 52*  GFRAA >60 >60 >60  ANIONGAP _0 No results for input(s): PROT, ALBUMIN, AST, ALT, ALKPHOS, BILITOT in the last 168 hours. Hematology Recent Labs  Lab 03/21/20 0543 03/22/20 0445 03/23/20 0309  WBC 12.0* 10.4 8.0  RBC 2.81* 2.76* 2.98*  HGB 11.3* 10.7* 11.6*  HCT 34.4* 33.6* 35.6*  MCV 122.4* 121.7* 119.5*  MCH 40.2* 38.8* 38.9*  MCHC 32.8 31.8 32.6  RDW 15.0 14.7 14.7  PLT 242 213 207   Cardiac EnzymesNo results for input(s): TROPONINI in the last 168 hours. No results for input(s): TROPIPOC in the last 168 hours.  BNP No results for input(s): BNP, PROBNP in the last 168 hours.  DDimer No results for input(s): DDIMER in the last 168 hours.  Radiology/Studies:  CARDIAC CATHETERIZATION  Result Date: 03/19/2020 1.  Calcific, nonobstructive coronary artery disease as outlined 2.  Preserved cardiac output of 7.56 L/min and cardiac index 4.16 3.  Known severe calcific aortic stenosis (stage D2 low-flow low gradient) with inability to cross the aortic valve with a wire 4.  Intracardiac hemodynamics suggestive of congestive heart failure with pulmonary capillary wedge pressure 31 mmHg, PA pressure 52/29 with a mean of 40 Recommend: IV diuresis and continued treatment of congestive heart failure, formal structural heart consultation for consideration of TAVR.  This will have to be weighed in the context of the patient's known comorbidities.  CT CORONARY MORPH W/CTA COR W/SCORE W/CA W/CM &/OR WO/CM  Addendum Date: 03/22/2020   ADDENDUM REPORT: 03/22/2020 19:51 CLINICAL DATA:  79 year old male with severe aortic stenosis being evaluated for a TAVR procedure. EXAM: Cardiac TAVR CT TECHNIQUE: The patient was scanned on a [employer]. A 120 kV retrospective scan was triggered in the descending thoracic aorta at 111 HU's. Gantry rotation speed was 250 msecs and collimation was .6 mm. 10 mg of IV Metoprolol were given. The 3D data set was reconstructed in  5% intervals of the R-R cycle. Systolic and diastolic phases were analyzed on a dedicated work station using MPR, MIP and VRT modes. The patient received 80 cc of contrast. FINDINGS: Aortic Valve: Trileaflet aortic valve with severely thickened and calcified leaflets and no calcifications extending into the LVOT. Aorta: Normal size, no dissection, mild diffuse atherosclerotic plaque and calcifications. Sinotubular Junction: 32 x 31 mm Ascending Thoracic Aorta: 34 x 34 mm Aortic Arch: 26 x 26 mm Descending Thoracic Aorta: 25 x 25 mm Sinus of Valsalva Measurements: Non-coronary: 33 mm Right -coronary: 33  mm Left -coronary: 34 mm Coronary Artery Height above Annulus: Left Main: 14 mm Right Coronary: 18 mm Virtual Basal Annulus Measurements: Maximum/Minimum Diameter: 27.4 x 23.3 mm Mean Diameter: 24.6 mm Perimeter: 79.1 mm Area: 475 mm2 Optimum Fluoroscopic Angle for Delivery: LAO 12 CAU 13 IMPRESSION: 1. Trileaflet aortic valve with severely thickened and calcified leaflets and no calcifications extending into the LVOT. Aortic valve calcium score is 3216 consistent with severe aortic stenosis. Annular measurements suitable for delivery of a 26 mm Edwards-SAPIEN 3 Ultra valve. 2. Sufficient coronary to annulus distance. 3. Optimum Fluoroscopic Angle for Delivery: LAO 12 CAU 13. 4. No thrombus in the left atrial appendage. Electronically Signed   By: Ena Dawley   On: 03/22/2020 19:51   Result Date: 03/22/2020 EXAM: OVER-READ INTERPRETATION  CT CHEST The following report is an over-read performed by radiologist Dr. Salvatore Marvel of [facility], [place] on 03/22/2020. This over-read does not include interpretation of cardiac or coronary anatomy or pathology. The coronary CTA interpretation by the cardiologist is attached. COMPARISON:  03/15/2020 chest CT. FINDINGS: Please see the separate concurrent chest CT angiogram report for details. IMPRESSION: Please see the separate concurrent chest CT angiogram report for  details. Electronically Signed: By: Ilona Sorrel M.D. On: 03/22/2020 11:40   CT ANGIO CHEST AORTA W/CM & OR WO/CM  Result Date: 03/22/2020 CLINICAL DATA:  Inpatient. Pre-TAVR evaluation. Severe aortic stenosis with chronic systolic heart failure. 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 EXAM: CT ANGIOGRAPHY CHEST, ABDOMEN AND PELVIS TECHNIQUE: Non-contrast CT of the chest was initially obtained. Multidetector CT imaging through the chest, abdomen and pelvis was performed using the standard protocol during bolus administration of intravenous contrast. Multiplanar reconstructed images and MIPs were obtained and reviewed to evaluate the vascular anatomy. CONTRAST:  153m OMNIPAQUE IOHEXOL 350 MG/ML SOLN COMPARISON:  03/15/2020 chest CT. 12/11/2018 PET-CT. FINDINGS: CTA CHEST FINDINGS Cardiovascular: Mild cardiomegaly. Diffuse thickening and coarse calcification of the aortic valve. Small pericardial effusion, similar. Right internal jugular Port-A-Cath terminates in the lower third of the SVC. Left anterior descending and right coronary atherosclerosis. Atherosclerotic nonaneurysmal thoracic aorta. Normal caliber pulmonary arteries. No central pulmonary emboli. Mediastinum/Nodes: No discrete thyroid nodules. Unremarkable esophagus. No pathologically enlarged axillary, mediastinal or hilar lymph nodes. Lungs/Pleura: No pneumothorax. Small to moderate dependent bilateral pleural effusions, unchanged. Moderate to severe centrilobular emphysema with diffuse bronchial wall thickening. Sharply marginated lower left perihilar consolidation with associated mild bronchiectasis, volume loss and distortion, unchanged, compatible with postradiation change. Moderate passive atelectasis in the dependent lower lobes bilaterally. No acute interval consolidation. No significant pulmonary nodules. Musculoskeletal: No aggressive appearing focal osseous lesions. Chronic severe T7 vertebral  compression fracture. Mild thoracic spondylosis. CTA ABDOMEN AND PELVIS FINDINGS Hepatobiliary: Normal liver size. A few scattered subcentimeter hypodense liver lesions are too small to characterize and are not appreciably changed since 12/11/2018 PET-CT, considered benign. No new liver lesions. Normal gallbladder with no radiopaque cholelithiasis. No biliary ductal dilatation. Pancreas: Normal, with no mass or duct dilation. Spleen: Normal size. No mass. Adrenals/Urinary Tract: Normal adrenals. No hydronephrosis. Exophytic hyperdense 2.8 cm renal cortical lesion in the medial upper left kidney (series 9/image 105) and exophytic isodense 2.2 cm renal cortical lesion in the far upper left kidney (series 9/image 97), neither appreciably changed size since 12/11/2018 PET-CT. Numerous simple renal cortical cysts in both kidneys, largest an exophytic simple 6.3 cm renal cortical cyst in the posterior lower left kidney. Numerous subcentimeter hypodense renal cortical lesions in both kidneys are  too small to characterize. Normal bladder. Stomach/Bowel: Normal non-distended stomach. Normal caliber small bowel with no small bowel wall thickening. Appendix not discretely visualized. Marked sigmoid diverticulosis with no large bowel wall thickening or significant pericolonic fat stranding. Vascular/Lymphatic: Atherosclerotic abdominal aorta with ectatic 2.5 cm infrarenal abdominal aorta. No pathologically enlarged lymph nodes in the abdomen or pelvis. Reproductive: Mildly enlarged prostate. Other: No pneumoperitoneum, ascites or focal fluid collection. Musculoskeletal: No aggressive appearing focal osseous lesions. Mild lumbar spondylosis. VASCULAR MEASUREMENTS PERTINENT TO TAVR: AORTA: Minimal Aortic Diameter-13.2 x 10.0 mm Severity of Aortic Calcification-moderate to severe RIGHT PELVIS: Right Common Iliac Artery - Minimal Diameter-8.0 x 4.1 mm Tortuosity-moderate Calcification-severe Right External Iliac Artery - Minimal  Diameter-6.5 x 4.7 mm Tortuosity-mild Calcification-moderate Right Common Femoral Artery - Minimal Diameter-5.9 x 3.5 mm Tortuosity-mild Calcification-severe LEFT PELVIS: Left Common Iliac Artery - Minimal Diameter-6.8 x 5.7 mm Tortuosity-mild Calcification-severe Left External Iliac Artery - Minimal Diameter-5.3 x 4.2 mm Tortuosity-mild-to-moderate Calcification-severe Left Common Femoral Artery - Minimal Diameter-6.2 x 4.7 mm Tortuosity-mild Calcification-severe Review of the MIP images confirms the above findings. IMPRESSION: 1. Vascular findings and measurements pertinent to potential TAVR procedure, as detailed. 2. Diffuse thickening and coarse calcification of the aortic valve, compatible with the reported history of severe aortic stenosis. 3. Stable postradiation change in the lower left perihilar lung. No evidence of local tumor recurrence. No findings of metastatic disease. 4. Stable small to moderate dependent bilateral pleural effusions. 5. Mild cardiomegaly. Small pericardial effusion, similar. Two-vessel coronary atherosclerosis. 6. Marked sigmoid diverticulosis. 7. Mildly enlarged prostate. 8. Aortic Atherosclerosis (ICD10-I70.0) and Emphysema (ICD10-J43.9). Electronically Signed   By: Ilona Sorrel M.D.   On: 03/22/2020 14:03   VAS US CAROTID  Result Date: 03/22/2020 Carotid Arterial Duplex Study Indications:       Pre tavr. Risk Factors:      Hypertension. Comparison Study:  no prior Performing Technologist: Abram Sander RVS  Examination Guidelines: A complete evaluation includes B-mode imaging, spectral Doppler, color Doppler, and power Doppler as needed of all accessible portions of each vessel. Bilateral testing is considered an integral part of a complete examination. Limited examinations for reoccurring indications may be performed as noted.  Right Carotid Findings: +----------+--------+--------+--------+------------------+--------+           PSV cm/sEDV cm/sStenosisPlaque  DescriptionComments +----------+--------+--------+--------+------------------+--------+ CCA Prox  26                                                 +----------+--------+--------+--------+------------------+--------+ CCA Distal21      4                                          +----------+--------+--------+--------+------------------+--------+ ICA Prox                  Occluded                           +----------+--------+--------+--------+------------------+--------+ ICA Mid                   Occluded                           +----------+--------+--------+--------+------------------+--------+ ICA Distal  Occluded                           +----------+--------+--------+--------+------------------+--------+ ECA       131                                                +----------+--------+--------+--------+------------------+--------+ +----------+--------+-------+--------+-------------------+           PSV cm/sEDV cmsDescribeArm Pressure (mmHG) +----------+--------+-------+--------+-------------------+ [id number]                                         +----------+--------+-------+--------+-------------------+ +---------+--------+--+--------+-+---------+ VertebralPSV cm/s28EDV cm/s8Antegrade +---------+--------+--+--------+-+---------+  Left Carotid Findings: +----------+--------+--------+--------+------------------+--------+           PSV cm/sEDV cm/sStenosisPlaque DescriptionComments +----------+--------+--------+--------+------------------+--------+ CCA Prox  67      14              heterogenous               +----------+--------+--------+--------+------------------+--------+ CCA Distal67      18              heterogenous               +----------+--------+--------+--------+------------------+--------+ ICA Prox  93      33      1-39%   heterogenous                +----------+--------+--------+--------+------------------+--------+ ICA Distal61      20                                         +----------+--------+--------+--------+------------------+--------+ ECA       95                                                 +----------+--------+--------+--------+------------------+--------+ +----------+--------+--------+--------+-------------------+           PSV cm/sEDV cm/sDescribeArm Pressure (mmHG) +----------+--------+--------+--------+-------------------+ [id number]                                          +----------+--------+--------+--------+-------------------+ +---------+--------+--+--------+-+---------+ VertebralPSV cm/s31EDV cm/s7Antegrade +---------+--------+--+--------+-+---------+   Summary: Right Carotid: Evidence consistent with a total occlusion of the right ICA. Left Carotid: Velocities in the left ICA are consistent with a 1-39% stenosis. Vertebrals: Bilateral vertebral arteries demonstrate antegrade flow. *See table(s) above for measurements and observations.  Electronically signed by Monica Martinez MD on 03/22/2020 at 4:46:21 PM.    Final    CT Angio Abd/Pel w/ and/or w/o  Result Date: 03/22/2020 CLINICAL DATA:  Inpatient. Pre-TAVR evaluation. Severe aortic stenosis with chronic systolic heart failure. 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 EXAM: CT ANGIOGRAPHY CHEST, ABDOMEN AND PELVIS TECHNIQUE: Non-contrast CT of the chest was initially obtained. Multidetector CT imaging through the chest, abdomen and pelvis was performed using the standard protocol during bolus administration of intravenous contrast. Multiplanar reconstructed images and MIPs were obtained and reviewed to evaluate the vascular anatomy. CONTRAST:  133m  OMNIPAQUE IOHEXOL 350 MG/ML SOLN COMPARISON:  03/15/2020 chest CT. 12/11/2018 PET-CT. FINDINGS: CTA CHEST FINDINGS Cardiovascular: Mild cardiomegaly. Diffuse  thickening and coarse calcification of the aortic valve. Small pericardial effusion, similar. Right internal jugular Port-A-Cath terminates in the lower third of the SVC. Left anterior descending and right coronary atherosclerosis. Atherosclerotic nonaneurysmal thoracic aorta. Normal caliber pulmonary arteries. No central pulmonary emboli. Mediastinum/Nodes: No discrete thyroid nodules. Unremarkable esophagus. No pathologically enlarged axillary, mediastinal or hilar lymph nodes. Lungs/Pleura: No pneumothorax. Small to moderate dependent bilateral pleural effusions, unchanged. Moderate to severe centrilobular emphysema with diffuse bronchial wall thickening. Sharply marginated lower left perihilar consolidation with associated mild bronchiectasis, volume loss and distortion, unchanged, compatible with postradiation change. Moderate passive atelectasis in the dependent lower lobes bilaterally. No acute interval consolidation. No significant pulmonary nodules. Musculoskeletal: No aggressive appearing focal osseous lesions. Chronic severe T7 vertebral compression fracture. Mild thoracic spondylosis. CTA ABDOMEN AND PELVIS FINDINGS Hepatobiliary: Normal liver size. A few scattered subcentimeter hypodense liver lesions are too small to characterize and are not appreciably changed since 12/11/2018 PET-CT, considered benign. No new liver lesions. Normal gallbladder with no radiopaque cholelithiasis. No biliary ductal dilatation. Pancreas: Normal, with no mass or duct dilation. Spleen: Normal size. No mass. Adrenals/Urinary Tract: Normal adrenals. No hydronephrosis. Exophytic hyperdense 2.8 cm renal cortical lesion in the medial upper left kidney (series 9/image 105) and exophytic isodense 2.2 cm renal cortical lesion in the far upper left kidney (series 9/image 97), neither appreciably changed size since 12/11/2018 PET-CT. Numerous simple renal cortical cysts in both kidneys, largest an exophytic simple 6.3 cm renal  cortical cyst in the posterior lower left kidney. Numerous subcentimeter hypodense renal cortical lesions in both kidneys are too small to characterize. Normal bladder. Stomach/Bowel: Normal non-distended stomach. Normal caliber small bowel with no small bowel wall thickening. Appendix not discretely visualized. Marked sigmoid diverticulosis with no large bowel wall thickening or significant pericolonic fat stranding. Vascular/Lymphatic: Atherosclerotic abdominal aorta with ectatic 2.5 cm infrarenal abdominal aorta. No pathologically enlarged lymph nodes in the abdomen or pelvis. Reproductive: Mildly enlarged prostate. Other: No pneumoperitoneum, ascites or focal fluid collection. Musculoskeletal: No aggressive appearing focal osseous lesions. Mild lumbar spondylosis. VASCULAR MEASUREMENTS PERTINENT TO TAVR: AORTA: Minimal Aortic Diameter-13.2 x 10.0 mm Severity of Aortic Calcification-moderate to severe RIGHT PELVIS: Right Common Iliac Artery - Minimal Diameter-8.0 x 4.1 mm Tortuosity-moderate Calcification-severe Right External Iliac Artery - Minimal Diameter-6.5 x 4.7 mm Tortuosity-mild Calcification-moderate Right Common Femoral Artery - Minimal Diameter-5.9 x 3.5 mm Tortuosity-mild Calcification-severe LEFT PELVIS: Left Common Iliac Artery - Minimal Diameter-6.8 x 5.7 mm Tortuosity-mild Calcification-severe Left External Iliac Artery - Minimal Diameter-5.3 x 4.2 mm Tortuosity-mild-to-moderate Calcification-severe Left Common Femoral Artery - Minimal Diameter-6.2 x 4.7 mm Tortuosity-mild Calcification-severe Review of the MIP images confirms the above findings. IMPRESSION: 1. Vascular findings and measurements pertinent to potential TAVR procedure, as detailed. 2. Diffuse thickening and coarse calcification of the aortic valve, compatible with the reported history of severe aortic stenosis. 3. Stable postradiation change in the lower left perihilar lung. No evidence of local tumor recurrence. No findings of  metastatic disease. 4. Stable small to moderate dependent bilateral pleural effusions. 5. Mild cardiomegaly. Small pericardial effusion, similar. Two-vessel coronary atherosclerosis. 6. Marked sigmoid diverticulosis. 7. Mildly enlarged prostate. 8. Aortic Atherosclerosis (ICD10-I70.0) and Emphysema (ICD10-J43.9). Electronically Signed   By: Ilona Sorrel M.D.   On: 03/22/2020 14:03     STS Risk Calculator:  Procedure: Isolated AVR   Isolated AVR: Risk of Mortality: 4.710% Renal Failure: 3.113% Permanent  Stroke: 1.397% Prolonged Ventilation: 17.555% DSW Infection: 0.193% Reoperation: 6.442% Morbidity or Mortality: 25.982% Short Length of Stay: 14.680% Long Length of Stay: 19.485%  _________________________   [facility] Cardiomyopathy Questionnaire  KCCQ-12 03/23/2020  1 a. Ability to shower/bathe Extremely limited  1 b. Ability to walk 1 block Extremely limited  1 c. Ability to hurry/jog Other, Did not do  2. Edema feet/ankles/legs Never over the past 2 weeks  3. Limited by fatigue All of the time  4. Limited by dyspnea All of the time  5. Sitting up / on 3+ pillows Every night  6. Limited enjoyment of life Extremely limited  7. Rest of life w/ symptoms Not at all satisfied  8 a. Participation in hobbies Severely limited  8 b. Participation in chores Severely limited  8 c. Visiting family/friends Severely limited     Assessment and Plan:   Robert Dixon is a 79 y.o. male with symptoms of severe, stage D2 LF LG gradient aortic stenosis with NYHA Class IV symptoms admitted with acute CHF. I have reviewed the patient's recent echocardiogram which is notable for reduced LV systolic function and severe aortic stenosis with peak gradient of 44.6 mm Hg and mean transvalvular gradient of 31 mm Hg. The patient's dimensionless index is 0.31 and calculated aortic valve area is 1.05 cm.  L/RHC on 4/23 which showed calcific non obstructive CAD with preserved CO (7.56 L/min) and CI (4.16).  Valve was not able to be crossed. Right heart cath showed hemodynamics congestive heart failure with pulmonary capillary wedge pressure 31 mmHg, PA pressure 52/29 with a mean of 40 mm Hg. 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  His gated cardiac CTA shows anatomy suitable for TAVR using a 26 mm Edwards Sapien 3 Ultra valve.  His abdominal and pelvic CTA shows borderline pelvic vascular anatomy to allow transfemoral insertion on the left.    I have reviewed the natural history of aortic stenosis with the patient. We have discussed the limitations of medical therapy and the poor prognosis associated with symptomatic aortic stenosis. We have reviewed potential treatment options, including palliative medical therapy, conventional surgical aortic valve replacement, and transcatheter aortic valve replacement. We discussed treatment options in the context of this patient's specific comorbid medical conditions.    The patient's predicted risk of mortality with conventional aortic valve replacement is 4.710% primarily based on his age, severe LV dysfunction, acute CHF, severe lung disease on home 02, active cancer with mediastinal radiation. TAVR seems like a reasonable treatment option in this patient with stage III b NSCLC. He is felt to be stable from a cancer standpoint. He would not be considered a candidate for conventional surgery. We are tentatively planning to proceed with TAVR tomorrow depending on anesthesia availability.    Dr. Cyndia Bent to follow.    Signed, Angelena Form, PA-C  03/23/2020 9:11 AM  I have reviewed his medical record, echocardiogram and cardiac catheterization studies, CTA images, and interviewed and examined the patient.  He is a 79 year old gentleman with oxygen dependent COPD and stage IIIb non-small cell lung cancer treated with chemotherapy and XRT as well as ongoing immunotherapy.   He says that he had chronic stable shortness of breath until the past couple weeks when he developed progressive symptoms of orthopnea and shortness of breath at rest.  He was noted to have an elevated BNP and pulmonary vascular congestion with bilateral pleural effusions  on chest x-ray.  He had a left thoracentesis done on 03/16/2020 removing 1.1 L of fluid.  Cytology was negative for malignancy.  He has known aortic stenosis and a follow-up echocardiogram now showed a reduction in his ejection fraction to 30 to 35% with a mean gradient 31 mmHg and a peak gradient of 44 mmHg across aortic valve.  The valve is calcified with restricted mobility and visually he has severe aortic stenosis.  Cardiac catheterization shows nonobstructive coronary disease with an elevated pulmonary capillary wedge pressure of 31 mmHg and moderate pulmonary hypertension at 52/29.  I think his shortness of breath is multifactorial due to severe COPD, moderate to large bilateral pleural effusions, and now severe aortic stenosis.  I agree that aortic valve replacement is indicated in this patient to improve his symptoms and prevent progressive left ventricular deterioration.  Given his history and age I think TAVR would be the best option for him.  His gated cardiac CTA shows anatomy suitable for transcatheter aortic valve replacement using a SAPIEN 3 valve.  CTA of the chest shows moderate to large bilateral pleural effusions.  He would benefit from repeat thoracentesis after TAVR.  There is no evidence of progression of his lung cancer on his CT scans and it currently appears under good control on immunotherapy with no lung masses, significant lymphadenopathy, or distant metastasis.  His abdominal and pelvic CTA shows adequate pelvic vascular anatomy to allow transfemoral insertion via the left femoral artery.  His right common iliac artery has significant calcific atherosclerotic plaque that would prevent passing the valve through that  side.  The patient was counseled at length regarding treatment alternatives for management of severe symptomatic aortic stenosis. The risks and benefits of surgical intervention has been discussed in detail. Long-term prognosis with medical therapy was discussed. Alternative approaches such as conventional surgical aortic valve replacement, transcatheter aortic valve replacement, and palliative medical therapy were compared and contrasted at length. This discussion was placed in the context of the patient's own specific clinical presentation and past medical history. All of his questions have been addressed.   Following the decision to proceed with transcatheter aortic valve replacement, a discussion was held regarding what types of management strategies would be attempted intraoperatively in the event of life-threatening complications, including whether or not the patient would be considered a candidate for the use of cardiopulmonary bypass and/or conversion to open sternotomy for attempted surgical intervention. The patient is aware of the fact that transient use of cardiopulmonary bypass may be necessary.  I do not think he would be a candidate for emergent sternotomy to manage any intraoperative complications.  The patient has been advised of a variety of complications that might develop including but not limited to risks of death, stroke, paravalvular leak, aortic dissection or other major vascular complications, aortic annulus rupture, device embolization, cardiac rupture or perforation, mitral regurgitation, acute myocardial infarction, arrhythmia, heart block or bradycardia requiring permanent pacemaker placement, congestive heart failure, respiratory failure, renal failure, pneumonia, infection, other late complications related to structural valve deterioration or migration, or other complications that might ultimately cause a temporary or permanent loss of functional independence or other long term  morbidity. The patient provides full informed consent for the procedure as described and all questions were answered.    Gaye Pollack, MD

## 2020-03-22 NOTE — Progress Notes (Signed)
Carotid duplex has been completed.   Preliminary results in CV Proc.   Robert Dixon 03/22/2020 4:29 PM

## 2020-03-22 NOTE — Progress Notes (Signed)
Progress Note  Patient Name: Robert Dixon Date of Encounter: 03/22/2020  Primary Cardiologist:   Minus Breeding, MD   Subjective   Patient seems to be breathing at baseline now.  He is much improved compared to admission.  Inpatient Medications    Scheduled Meds: . arformoterol  15 mcg Nebulization BID  . aspirin  81 mg Oral Daily  . budesonide (PULMICORT) nebulizer solution  0.5 mg Nebulization BID  . chlorhexidine  15 mL Mouth Rinse BID  . Chlorhexidine Gluconate Cloth  6 each Topical Daily  . docusate sodium  100 mg Oral BID  . furosemide  40 mg Intravenous Q12H  . heparin injection (subcutaneous)  5,000 Units Subcutaneous Q8H  . ipratropium-albuterol  3 mL Nebulization TID  . mouth rinse  15 mL Mouth Rinse q12n4p  . pantoprazole  40 mg Oral BID  . polycarbophil  625 mg Oral BID  . predniSONE  20 mg Oral Q breakfast  . sodium chloride flush  10-40 mL Intracatheter Q12H  . sodium chloride flush  3 mL Intravenous Q12H  . sodium chloride flush  3 mL Intravenous Q12H  . tamsulosin  0.4 mg Oral Daily   Continuous Infusions: . sodium chloride     PRN Meds: sodium chloride, acetaminophen **OR** acetaminophen, albuterol, alum & mag hydroxide-simeth, dimenhyDRINATE, guaiFENesin-dextromethorphan, lip balm, ondansetron **OR** ondansetron (ZOFRAN) IV, polyethylene glycol, sodium chloride flush, sodium chloride flush, traMADol   Vital Signs    Vitals:   03/21/20 2050 03/22/20 0300 03/22/20 0432 03/22/20 0825  BP: 101/74  100/68 110/63  Pulse: (!) 107 92 95 (!) 104  Resp: 19  19 20   Temp: 98 F (36.7 C)  (!) 97.5 F (36.4 C) (!) 97.3 F (36.3 C)  TempSrc: Oral  Oral Oral  SpO2: 97% 97% 98% 97%  Weight:   68.8 kg   Height:        Intake/Output Summary (Last 24 hours) at 03/22/2020 1005 Last data filed at 03/22/2020 0559 Gross per 24 hour  Intake 497 ml  Output 2180 ml  Net -1683 ml   Filed Weights   03/20/20 0326 03/21/20 0448 03/22/20 0432  Weight: 70 kg 69.7  kg 68.8 kg    ECG     NA - Personally Reviewed  Physical Exam   GEN: No acute distress.   Neck: No  JVD Cardiac: RRR, 3/6 apical systolic murmur radiating out the outflow tract, no diastolic murmurs, rubs, or gallops.  Respiratory: Clear  to auscultation bilaterally. GI: Soft, nontender, non-distended  MS: No  edema; No deformity. Neuro:  Nonfocal  Psych: Normal affect   Labs    Chemistry Recent Labs  Lab 03/20/20 0334 03/21/20 0543 03/22/20 0445  NA 143 143 142  K 3.8 4.1 3.7  CL 105 102 100  CO2 29 32 34*  GLUCOSE 172* 123* 130*  BUN 45* 42* 41*  CREATININE 1.25* 1.21 1.24  CALCIUM 8.7* 8.6* 8.3*  GFRNONAA 55* 57* 55*  GFRAA >60 >60 >60  ANIONGAP 9 9 8      Hematology Recent Labs  Lab 03/20/20 0334 03/21/20 0543 03/22/20 0445  WBC 13.4* 12.0* 10.4  RBC 2.79* 2.81* 2.76*  HGB 11.2* 11.3* 10.7*  HCT 34.6* 34.4* 33.6*  MCV 124.0* 122.4* 121.7*  MCH 40.1* 40.2* 38.8*  MCHC 32.4 32.8 31.8  RDW 15.0 15.0 14.7  PLT 257 242 213    Cardiac EnzymesNo results for input(s): TROPONINI in the last 168 hours. No results for input(s): TROPIPOC in the  last 168 hours.   BNP Recent Labs  Lab 03/15/20 1213 03/16/20 0236  BNP 1,080.5* 910.9*     DDimer No results for input(s): DDIMER in the last 168 hours.   Radiology    No results found.  Cardiac Studies    Echo: IMPRESSIONS   1. Hypokinesis of the distal anteroseptal wall and apex; overall moderate  to severe LV dysfunction; calcified aortic valve with probable severe AS  (mean gradient 31 mmHg; AVA 1 cm2) and mild AI; mild MR; mild LAE; small  pericardial effusion with RA  inversion.  2. Left ventricular ejection fraction, by estimation, is 30 to 35%. The  left ventricle has moderate to severely decreased function. The left  ventricle has no regional wall motion abnormalities. The left ventricular  internal cavity size was mildly  dilated. There is mild left ventricular hypertrophy. Left  ventricular  diastolic parameters are indeterminate. Elevated left atrial pressure.  3. Right ventricular systolic function is normal. The right ventricular  size is normal. There is mildly elevated pulmonary artery systolic  pressure.  4. Left atrial size was mildly dilated.  5. The mitral valve is normal in structure. Mild mitral valve  regurgitation. No evidence of mitral stenosis.  6. The aortic valve has an indeterminant number of cusps. Aortic valve  regurgitation is mild. No aortic stenosis is present.    Cath 1.  Calcific, nonobstructive coronary artery disease as outlined 2.  Preserved cardiac output of 7.56 L/min and cardiac index 4.16 3.  Known severe calcific aortic stenosis (stage D2 low-flow low gradient) with inability to cross the aortic valve with a wire 4.  Intracardiac hemodynamics suggestive of congestive heart failure with pulmonary capillary wedge pressure 31 mmHg, PA pressure 52/29 with a mean of 40  Patient Profile     79 y.o. male  from [place] with severe aortic stenosis, chronic systolic heart failure, lung cancer with favorable prognosis  Assessment & Plan    AS:  Plan is to proceed with work up for TAVR.  CT done today for pre TAVR work up.  I spoke with the patient and he would agree to proceed if he is thought to be a candidate.   ACUTE ON CHRONIC SYSTOLIC HF:  EF is reduced compared to previous.  Net negative 6.4 liters this admission.  Negative 1.8 liters yesterday.   Creat has tolerated this and has trended down.   Agree with IV Lasix and change to PO in the AM.    PERICARDIAL EFFUSION:  Volume is small.       For questions or updates, please contact [place] Please consult [email or web address] for contact info under Cardiology/STEMI.   Signed, Minus Breeding, MD  03/22/2020, 10:05 AM

## 2020-03-23 ENCOUNTER — Inpatient Hospital Stay (HOSPITAL_COMMUNITY): Admission: RE | Admit: 2020-03-23 | Payer: Medicare HMO | Source: Ambulatory Visit

## 2020-03-23 DIAGNOSIS — I350 Nonrheumatic aortic (valve) stenosis: Secondary | ICD-10-CM

## 2020-03-23 DIAGNOSIS — J449 Chronic obstructive pulmonary disease, unspecified: Secondary | ICD-10-CM

## 2020-03-23 LAB — BASIC METABOLIC PANEL
Anion gap: 9 (ref 5–15)
BUN: 35 mg/dL — ABNORMAL HIGH (ref 8–23)
CO2: 31 mmol/L (ref 22–32)
Calcium: 8.3 mg/dL — ABNORMAL LOW (ref 8.9–10.3)
Chloride: 95 mmol/L — ABNORMAL LOW (ref 98–111)
Creatinine, Ser: 1.31 mg/dL — ABNORMAL HIGH (ref 0.61–1.24)
GFR calc Af Amer: >60 mL/min (ref >60)
GFR calc non Af Amer: 52 mL/min — ABNORMAL LOW (ref >60)
Glucose, Bld: 179 mg/dL — ABNORMAL HIGH (ref 70–99)
Potassium: 3.1 mmol/L — ABNORMAL LOW (ref 3.5–5.1)
Sodium: 135 mmol/L (ref 135–145)

## 2020-03-23 LAB — CBC
HCT: 35.6 % — ABNORMAL LOW (ref 39.0–52.0)
Hemoglobin: 11.6 g/dL — ABNORMAL LOW (ref 13.0–17.0)
MCH: 38.9 pg — ABNORMAL HIGH (ref 26.0–34.0)
MCHC: 32.6 g/dL (ref 30.0–36.0)
MCV: 119.5 fL — ABNORMAL HIGH (ref 80.0–100.0)
Platelets: 207 10*3/uL (ref 150–400)
RBC: 2.98 MIL/uL — ABNORMAL LOW (ref 4.22–5.81)
RDW: 14.7 % (ref 11.5–15.5)
WBC: 8 10*3/uL (ref 4.0–10.5)
nRBC: 0.3 % — ABNORMAL HIGH (ref 0.0–0.2)

## 2020-03-23 MED ORDER — FUROSEMIDE 40 MG PO TABS
40.0000 mg | ORAL_TABLET | Freq: Every day | ORAL | Status: DC
  Administered 2020-03-23 – 2020-03-27 (×5): 40 mg via ORAL
  Filled 2020-03-23 (×5): qty 1

## 2020-03-23 MED ORDER — POTASSIUM CHLORIDE CRYS ER 20 MEQ PO TBCR
40.0000 meq | EXTENDED_RELEASE_TABLET | ORAL | Status: AC
  Administered 2020-03-23 (×2): 40 meq via ORAL
  Filled 2020-03-23 (×2): qty 2

## 2020-03-23 MED ORDER — PREDNISONE 10 MG PO TABS
10.0000 mg | ORAL_TABLET | Freq: Every day | ORAL | Status: AC
  Administered 2020-03-23: 10 mg via ORAL
  Filled 2020-03-23: qty 1

## 2020-03-23 MED ORDER — FUROSEMIDE 40 MG PO TABS: 40.0000 mg | ORAL_TABLET | Freq: Two times a day (BID) | ORAL | Status: DC

## 2020-03-23 NOTE — Progress Notes (Signed)
Physical Therapy Treatment Patient Details Name: Robert Dixon MRN: [id number] DOB: [birth year] Today's Date: 03/23/2020    History of Present Illness 79 y/o male with advanced non-small cell lung cancer and aortic stenosis admitted 03/16/20 for acute on chronic repiratory failure with hypoxemia in the setting of a recurrent left sided pleural effusion. On Home oxygen 3 L. Pt to have TAVR 03/24/20.    PT Comments    Pt making steady progress. Pt for TAVR tomorrow. Will return this PM for pre-TAVR assessment.    Follow Up Recommendations  Home health PT;Supervision - Intermittent     Equipment Recommendations  None recommended by PT    Recommendations for Other Services       Precautions / Restrictions Precautions Precautions: Fall Precaution Comments: monitor sats, HR Restrictions Weight Bearing Restrictions: Yes RUE Weight Bearing: Non weight bearing    Mobility  Bed Mobility Overal bed mobility: Modified Independent Bed Mobility: Supine to Sit     Supine to sit: Modified independent (Device/Increase time);HOB elevated        Transfers Overall transfer level: Needs assistance Equipment used: 4-wheeled walker Transfers: Sit to/from Stand Sit to Stand: Min guard         General transfer comment: Assist for lines  Ambulation/Gait Ambulation/Gait assistance: Min guard Gait Distance (Feet): 100 Feet Assistive device: 4-wheeled walker Gait Pattern/deviations: Step-through pattern;Decreased stride length Gait velocity: decr Gait velocity interpretation: 1.31 - 2.62 ft/sec, indicative of limited community ambulator General Gait Details: Assist for lines/safety. Pt amb on 4L with SpO2 >98%   Stairs             Wheelchair Mobility    Modified Rankin (Stroke Patients Only)       Balance Overall balance assessment: Needs assistance Sitting-balance support: No upper extremity supported;Feet supported Sitting balance-Leahy Scale: Good     Standing  balance support: No upper extremity supported;During functional activity Standing balance-Leahy Scale: Fair                              Cognition Arousal/Alertness: Awake/alert Behavior During Therapy: WFL for tasks assessed/performed Overall Cognitive Status: Within Functional Limits for tasks assessed                                        Exercises      General Comments        Pertinent Vitals/Pain Pain Assessment: No/denies pain    Home Living                      Prior Function            PT Goals (current goals can now be found in the care plan section) Acute Rehab PT Goals Patient Stated Goal: to be able to go home, Progress towards PT goals: Progressing toward goals    Frequency    Min 3X/week      PT Plan Current plan remains appropriate    Co-evaluation              AM-PAC PT "6 Clicks" Mobility   Outcome Measure  Help needed turning from your back to your side while in a flat bed without using bedrails?: None Help needed moving from lying on your back to sitting on the side of a flat bed without using bedrails?: None Help needed moving to  and from a bed to a chair (including a wheelchair)?: A Little Help needed standing up from a chair using your arms (e.g., wheelchair or bedside chair)?: A Little Help needed to walk in hospital room?: A Little Help needed climbing 3-5 steps with a railing? : A Little 6 Click Score: 20    End of Session Equipment Utilized During Treatment: Oxygen;Gait belt Activity Tolerance: Patient tolerated treatment well Patient left: with call bell/phone within reach;in bed(sitting EOB)   PT Visit Diagnosis: Unsteadiness on feet (R26.81);Difficulty in walking, not elsewhere classified (R26.2)     Time: 4643-1427 PT Time Calculation (min) (ACUTE ONLY): 18 min  Charges:  $Gait Training: 8-22 mins                     [place] Pager  [phone] Office [place] 03/23/2020, 10:58 AM

## 2020-03-23 NOTE — Progress Notes (Signed)
Physical Therapy Treatment Patient Details Name: Robert Dixon MRN: [id number] DOB: 08-May-1941 Today's Date: 03/23/2020    History of Present Illness 79 y/o male with advanced non-small cell lung cancer and aortic stenosis admitted 03/16/20 for acute on chronic repiratory failure with hypoxemia in the setting of a recurrent left sided pleural effusion. On Home oxygen 3 L. Pt to have TAVR 03/24/20.    PT Comments    Performed pre-TAVR assessment.    6 Minute Walk Test:   Total Distance Walked:146 ft.    Did the pt need a rest break? Yes If yes, why? Pain:No; Fatigue:Yes; Dyspnea/O2 saturations: No Comments: Pt amb for the first 2:30 minutes and then had to sit and rest for remainder of 6 minutes.    Pre-Test Post-Test  BP    HR  90 98  O2 saturations (indicated RA or L/min [place]) 98 on 3L 95 on 3L  Modified Borg Dyspnea Scale (0 none-10 maximal) 0 3  RPE (6 very light-10 very hard) 6 13  Comments:   5 Meter Walk Test:  Trial 1 9.66 seconds  Trial 2 10.36 seconds  Trial 3 10.60 seconds  3 Trial Average/Gait Speed 10.2 seconds/1.61 ft/sec (<1.8 ft/sec indicates high fall risk)  Comments:   Clinical Frailty Scale (1 very fit - 9 terminally ill): 4 (</= 5/12 is considered frail)       Follow Up Recommendations  Home health PT;Supervision - Intermittent     Equipment Recommendations  None recommended by PT    Recommendations for Other Services       Precautions / Restrictions Precautions Precautions: Fall Precaution Comments: monitor sats, HR    Mobility  Bed Mobility Overal bed mobility: Modified Independent Bed Mobility: Supine to Sit     Supine to sit: Modified independent (Device/Increase time);HOB elevated        Transfers Overall transfer level: Needs assistance Equipment used: 4-wheeled walker Transfers: Sit to/from Stand Sit to Stand: Min guard         General transfer comment: Assist for lines  Ambulation/Gait Ambulation/Gait assistance:  Min guard Gait Distance (Feet): 145 Feet Assistive device: 4-wheeled walker Gait Pattern/deviations: Step-through pattern;Decreased stride length Gait velocity: decr Gait velocity interpretation: 1.31 - 2.62 ft/sec, indicative of limited community ambulator General Gait Details: Assist for lines/safety. Pt amb on 4L with SpO2 >95%   Stairs             Wheelchair Mobility    Modified Rankin (Stroke Patients Only)       Balance Overall balance assessment: Needs assistance Sitting-balance support: No upper extremity supported;Feet supported Sitting balance-Leahy Scale: Good     Standing balance support: No upper extremity supported;During functional activity Standing balance-Leahy Scale: Fair                              Cognition Arousal/Alertness: Awake/alert Behavior During Therapy: WFL for tasks assessed/performed Overall Cognitive Status: Within Functional Limits for tasks assessed                                        Exercises      General Comments        Pertinent Vitals/Pain Pain Assessment: No/denies pain    Home Living  Prior Function            PT Goals (current goals can now be found in the care plan section) Acute Rehab PT Goals Patient Stated Goal: to be able to go home, Progress towards PT goals: Progressing toward goals    Frequency    Min 3X/week      PT Plan Current plan remains appropriate    Co-evaluation              AM-PAC PT "6 Clicks" Mobility   Outcome Measure  Help needed turning from your back to your side while in a flat bed without using bedrails?: None Help needed moving from lying on your back to sitting on the side of a flat bed without using bedrails?: None Help needed moving to and from a bed to a chair (including a wheelchair)?: A Little Help needed standing up from a chair using your arms (e.g., wheelchair or bedside chair)?: A Little Help  needed to walk in hospital room?: A Little Help needed climbing 3-5 steps with a railing? : A Little 6 Click Score: 20    End of Session Equipment Utilized During Treatment: Oxygen Activity Tolerance: Patient tolerated treatment well Patient left: with call bell/phone within reach;in bed(sitting EOB)   PT Visit Diagnosis: Unsteadiness on feet (R26.81);Difficulty in walking, not elsewhere classified (R26.2)     Time: 2202-5427 PT Time Calculation (min) (ACUTE ONLY): 22 min  Charges:  $Physical Performance Test: 8-22 mins                     [place] Pager [phone] Office [place] 03/23/2020, 2:32 PM

## 2020-03-23 NOTE — TOC Progression Note (Addendum)
Transition of Care [facility]) - Progression Note    Patient Details  Name: Robert Dixon MRN: [id number] Date of Birth: August 22, 1941  Transition of Care [facility]) CM/SW [place], [place] Phone Number: 03/23/2020, 9:49 AM  Clinical Narrative:     10:24am- patient's son returned call, patient's son wants to wait to have further discussion with MD and family about procedures and their recommendations and then theywill  confirm CSW of SNF choice.   9:48am-CSW called patient's son,Rick, left voice message to return call.   Thurmond Butts, MSW, [place] Clinical Social Worker   Expected Discharge Plan: Skilled Nursing Facility Barriers to Discharge: Continued Medical Work up  Expected Discharge Plan and Services Expected Discharge Plan: [place]   Discharge Planning Services: CM Consult   Living arrangements for the past 2 months: Single Family Home                                       Social Determinants of Health (SDOH) Interventions    Readmission Risk Interventions Readmission Risk Prevention Plan 03/18/2020  Transportation Screening Complete  PCP or Specialist Appt within 3-5 Days Complete  HRI or [place] Complete  Social Work Consult for [place] Planning/Counseling Complete  Palliative Care Screening Not Applicable  Medication Review [employer] photographer) Complete  Some recent data might be hidden

## 2020-03-23 NOTE — Progress Notes (Signed)
Patient had just stood up to be weight. So patient has hx of S.T. Resp. Has been a little high at 22. This is not new.

## 2020-03-23 NOTE — Progress Notes (Signed)
B.P. left arm 96/60 p.-96 before.B.P. right arm 88/57, P-95 Held this a.m. I.V. Lasix due to low B.P.. R.N. aware

## 2020-03-23 NOTE — Progress Notes (Signed)
Progress Note  Patient Name: Robert Dixon Date of Encounter: 03/23/2020  Primary Cardiologist:   Minus Breeding, MD   Subjective   BP low and HR mildly increased overnight.  NSVT noted.   He is breathing back to baseline.  No palpitations or pain.   Inpatient Medications    Scheduled Meds: . arformoterol  15 mcg Nebulization BID  . aspirin  81 mg Oral Daily  . budesonide (PULMICORT) nebulizer solution  0.5 mg Nebulization BID  . chlorhexidine  15 mL Mouth Rinse BID  . Chlorhexidine Gluconate Cloth  6 each Topical Daily  . docusate sodium  100 mg Oral BID  . furosemide  40 mg Intravenous Q12H  . heparin injection (subcutaneous)  5,000 Units Subcutaneous Q8H  . mouth rinse  15 mL Mouth Rinse q12n4p  . pantoprazole  40 mg Oral BID  . polycarbophil  625 mg Oral BID  . potassium chloride  40 mEq Oral Q4H  . predniSONE  10 mg Oral Q breakfast  . sodium chloride flush  10-40 mL Intracatheter Q12H  . sodium chloride flush  3 mL Intravenous Q12H  . sodium chloride flush  3 mL Intravenous Q12H  . tamsulosin  0.4 mg Oral Daily   Continuous Infusions: . sodium chloride     PRN Meds: sodium chloride, acetaminophen **OR** acetaminophen, albuterol, alum & mag hydroxide-simeth, dimenhyDRINATE, guaiFENesin-dextromethorphan, lip balm, ondansetron **OR** ondansetron (ZOFRAN) IV, polyethylene glycol, sodium chloride flush, sodium chloride flush, traMADol   Vital Signs    Vitals:   03/23/20 0607 03/23/20 0610 03/23/20 0614 03/23/20 0617  BP: 96/60 (!) 88/57    Pulse: 96 96 95   Resp: (!) 21 14 (!) 22 20  Temp: (!) 97.5 F (36.4 C)     TempSrc: Oral     SpO2: 100% 100% 100%   Weight:   68.6 kg   Height:        Intake/Output Summary (Last 24 hours) at 03/23/2020 0800 Last data filed at 03/23/2020 0210 Gross per 24 hour  Intake 720 ml  Output 2475 ml  Net -1755 ml   Filed Weights   03/21/20 0448 03/22/20 0432 03/23/20 0614  Weight: 69.7 kg 68.8 kg 68.6 kg    Telemetry     NSR, NSVT, ST - Personally Reviewed  Physical Exam   GEN: No  acute distress.   Neck: No  JVD Cardiac: RRR, 3/6 apical systolic murmur, no diastolic murmurs, rubs, or gallops.  Respiratory: Clear   to auscultation bilaterally. GI: Soft, nontender, non-distended, normal bowel sounds  MS:   edema; No deformity. Neuro:   Nonfocal  Psych: Oriented and appropriate    Labs    Chemistry Recent Labs  Lab 03/21/20 0543 03/22/20 0445 03/23/20 0309  NA 143 142 135  K 4.1 3.7 3.1*  CL 102 100 95*  CO2 32 34* 31  GLUCOSE 123* 130* 179*  BUN 42* 41* 35*  CREATININE 1.21 1.24 1.31*  CALCIUM 8.6* 8.3* 8.3*  GFRNONAA 57* 55* 52*  GFRAA >60 >60 >60  ANIONGAP 9 8 9      Hematology Recent Labs  Lab 03/21/20 0543 03/22/20 0445 03/23/20 0309  WBC 12.0* 10.4 8.0  RBC 2.81* 2.76* 2.98*  HGB 11.3* 10.7* 11.6*  HCT 34.4* 33.6* 35.6*  MCV 122.4* 121.7* 119.5*  MCH 40.2* 38.8* 38.9*  MCHC 32.8 31.8 32.6  RDW 15.0 14.7 14.7  PLT 242 213 207    Cardiac EnzymesNo results for input(s): TROPONINI in the last 168  hours. No results for input(s): TROPIPOC in the last 168 hours.   BNP No results for input(s): BNP, PROBNP in the last 168 hours.   DDimer No results for input(s): DDIMER in the last 168 hours.   Radiology    CT CORONARY MORPH W/CTA COR W/SCORE W/CA W/CM &/OR WO/CM  Addendum Date: 03/22/2020   ADDENDUM REPORT: 03/22/2020 19:51 CLINICAL DATA:  79 year old male with severe aortic stenosis being evaluated for a TAVR procedure. EXAM: Cardiac TAVR CT TECHNIQUE: The patient was scanned on a [employer]. A 120 kV retrospective scan was triggered in the descending thoracic aorta at 111 HU's. Gantry rotation speed was 250 msecs and collimation was .6 mm. 10 mg of IV Metoprolol were given. The 3D data set was reconstructed in 5% intervals of the R-R cycle. Systolic and diastolic phases were analyzed on a dedicated work station using MPR, MIP and VRT modes. The patient  received 80 cc of contrast. FINDINGS: Aortic Valve: Trileaflet aortic valve with severely thickened and calcified leaflets and no calcifications extending into the LVOT. Aorta: Normal size, no dissection, mild diffuse atherosclerotic plaque and calcifications. Sinotubular Junction: 32 x 31 mm Ascending Thoracic Aorta: 34 x 34 mm Aortic Arch: 26 x 26 mm Descending Thoracic Aorta: 25 x 25 mm Sinus of Valsalva Measurements: Non-coronary: 33 mm Right -coronary: 33 mm Left -coronary: 34 mm Coronary Artery Height above Annulus: Left Main: 14 mm Right Coronary: 18 mm Virtual Basal Annulus Measurements: Maximum/Minimum Diameter: 27.4 x 23.3 mm Mean Diameter: 24.6 mm Perimeter: 79.1 mm Area: 475 mm2 Optimum Fluoroscopic Angle for Delivery: LAO 12 CAU 13 IMPRESSION: 1. Trileaflet aortic valve with severely thickened and calcified leaflets and no calcifications extending into the LVOT. Aortic valve calcium score is 3216 consistent with severe aortic stenosis. Annular measurements suitable for delivery of a 26 mm Edwards-SAPIEN 3 Ultra valve. 2. Sufficient coronary to annulus distance. 3. Optimum Fluoroscopic Angle for Delivery: LAO 12 CAU 13. 4. No thrombus in the left atrial appendage. Electronically Signed   By: Ena Dawley   On: 03/22/2020 19:51   Result Date: 03/22/2020 EXAM: OVER-READ INTERPRETATION  CT CHEST The following report is an over-read performed by radiologist Dr. Salvatore Marvel of [facility], [place] on 03/22/2020. This over-read does not include interpretation of cardiac or coronary anatomy or pathology. The coronary CTA interpretation by the cardiologist is attached. COMPARISON:  03/15/2020 chest CT. FINDINGS: Please see the separate concurrent chest CT angiogram report for details. IMPRESSION: Please see the separate concurrent chest CT angiogram report for details. Electronically Signed: By: Ilona Sorrel M.D. On: 03/22/2020 11:40   CT ANGIO CHEST AORTA W/CM & OR WO/CM  Result Date:  03/22/2020 CLINICAL DATA:  Inpatient. Pre-TAVR evaluation. Severe aortic stenosis with chronic systolic heart failure. 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 EXAM: CT ANGIOGRAPHY CHEST, ABDOMEN AND PELVIS TECHNIQUE: Non-contrast CT of the chest was initially obtained. Multidetector CT imaging through the chest, abdomen and pelvis was performed using the standard protocol during bolus administration of intravenous contrast. Multiplanar reconstructed images and MIPs were obtained and reviewed to evaluate the vascular anatomy. CONTRAST:  17mL OMNIPAQUE IOHEXOL 350 MG/ML SOLN COMPARISON:  03/15/2020 chest CT. 12/11/2018 PET-CT. FINDINGS: CTA CHEST FINDINGS Cardiovascular: Mild cardiomegaly. Diffuse thickening and coarse calcification of the aortic valve. Small pericardial effusion, similar. Right internal jugular Port-A-Cath terminates in the lower third of the SVC. Left anterior descending and right coronary atherosclerosis. Atherosclerotic nonaneurysmal thoracic aorta. Normal caliber pulmonary arteries. No  central pulmonary emboli. Mediastinum/Nodes: No discrete thyroid nodules. Unremarkable esophagus. No pathologically enlarged axillary, mediastinal or hilar lymph nodes. Lungs/Pleura: No pneumothorax. Small to moderate dependent bilateral pleural effusions, unchanged. Moderate to severe centrilobular emphysema with diffuse bronchial wall thickening. Sharply marginated lower left perihilar consolidation with associated mild bronchiectasis, volume loss and distortion, unchanged, compatible with postradiation change. Moderate passive atelectasis in the dependent lower lobes bilaterally. No acute interval consolidation. No significant pulmonary nodules. Musculoskeletal: No aggressive appearing focal osseous lesions. Chronic severe T7 vertebral compression fracture. Mild thoracic spondylosis. CTA ABDOMEN AND PELVIS FINDINGS Hepatobiliary: Normal liver size. A few scattered  subcentimeter hypodense liver lesions are too small to characterize and are not appreciably changed since 12/11/2018 PET-CT, considered benign. No new liver lesions. Normal gallbladder with no radiopaque cholelithiasis. No biliary ductal dilatation. Pancreas: Normal, with no mass or duct dilation. Spleen: Normal size. No mass. Adrenals/Urinary Tract: Normal adrenals. No hydronephrosis. Exophytic hyperdense 2.8 cm renal cortical lesion in the medial upper left kidney (series 9/image 105) and exophytic isodense 2.2 cm renal cortical lesion in the far upper left kidney (series 9/image 97), neither appreciably changed size since 12/11/2018 PET-CT. Numerous simple renal cortical cysts in both kidneys, largest an exophytic simple 6.3 cm renal cortical cyst in the posterior lower left kidney. Numerous subcentimeter hypodense renal cortical lesions in both kidneys are too small to characterize. Normal bladder. Stomach/Bowel: Normal non-distended stomach. Normal caliber small bowel with no small bowel wall thickening. Appendix not discretely visualized. Marked sigmoid diverticulosis with no large bowel wall thickening or significant pericolonic fat stranding. Vascular/Lymphatic: Atherosclerotic abdominal aorta with ectatic 2.5 cm infrarenal abdominal aorta. No pathologically enlarged lymph nodes in the abdomen or pelvis. Reproductive: Mildly enlarged prostate. Other: No pneumoperitoneum, ascites or focal fluid collection. Musculoskeletal: No aggressive appearing focal osseous lesions. Mild lumbar spondylosis. VASCULAR MEASUREMENTS PERTINENT TO TAVR: AORTA: Minimal Aortic Diameter-13.2 x 10.0 mm Severity of Aortic Calcification-moderate to severe RIGHT PELVIS: Right Common Iliac Artery - Minimal Diameter-8.0 x 4.1 mm Tortuosity-moderate Calcification-severe Right External Iliac Artery - Minimal Diameter-6.5 x 4.7 mm Tortuosity-mild Calcification-moderate Right Common Femoral Artery - Minimal Diameter-5.9 x 3.5 mm  Tortuosity-mild Calcification-severe LEFT PELVIS: Left Common Iliac Artery - Minimal Diameter-6.8 x 5.7 mm Tortuosity-mild Calcification-severe Left External Iliac Artery - Minimal Diameter-5.3 x 4.2 mm Tortuosity-mild-to-moderate Calcification-severe Left Common Femoral Artery - Minimal Diameter-6.2 x 4.7 mm Tortuosity-mild Calcification-severe Review of the MIP images confirms the above findings. IMPRESSION: 1. Vascular findings and measurements pertinent to potential TAVR procedure, as detailed. 2. Diffuse thickening and coarse calcification of the aortic valve, compatible with the reported history of severe aortic stenosis. 3. Stable postradiation change in the lower left perihilar lung. No evidence of local tumor recurrence. No findings of metastatic disease. 4. Stable small to moderate dependent bilateral pleural effusions. 5. Mild cardiomegaly. Small pericardial effusion, similar. Two-vessel coronary atherosclerosis. 6. Marked sigmoid diverticulosis. 7. Mildly enlarged prostate. 8. Aortic Atherosclerosis (ICD10-I70.0) and Emphysema (ICD10-J43.9). Electronically Signed   By: Ilona Sorrel M.D.   On: 03/22/2020 14:03   VAS US CAROTID  Result Date: 03/22/2020 Carotid Arterial Duplex Study Indications:       Pre tavr. Risk Factors:      Hypertension. Comparison Study:  no prior Performing Technologist: Abram Sander RVS  Examination Guidelines: A complete evaluation includes B-mode imaging, spectral Doppler, color Doppler, and power Doppler as needed of all accessible portions of each vessel. Bilateral testing is considered an integral part of a complete examination. Limited examinations for reoccurring indications may be performed  as noted.  Right Carotid Findings: +----------+--------+--------+--------+------------------+--------+           PSV cm/sEDV cm/sStenosisPlaque DescriptionComments +----------+--------+--------+--------+------------------+--------+ CCA Prox  26                                                  +----------+--------+--------+--------+------------------+--------+ CCA Distal21      4                                          +----------+--------+--------+--------+------------------+--------+ ICA Prox                  Occluded                           +----------+--------+--------+--------+------------------+--------+ ICA Mid                   Occluded                           +----------+--------+--------+--------+------------------+--------+ ICA Distal                Occluded                           +----------+--------+--------+--------+------------------+--------+ ECA       131                                                +----------+--------+--------+--------+------------------+--------+ +----------+--------+-------+--------+-------------------+           PSV cm/sEDV cmsDescribeArm Pressure (mmHG) +----------+--------+-------+--------+-------------------+ [id number]                                         +----------+--------+-------+--------+-------------------+ +---------+--------+--+--------+-+---------+ VertebralPSV cm/s28EDV cm/s8Antegrade +---------+--------+--+--------+-+---------+  Left Carotid Findings: +----------+--------+--------+--------+------------------+--------+           PSV cm/sEDV cm/sStenosisPlaque DescriptionComments +----------+--------+--------+--------+------------------+--------+ CCA Prox  67      14              heterogenous               +----------+--------+--------+--------+------------------+--------+ CCA Distal67      18              heterogenous               +----------+--------+--------+--------+------------------+--------+ ICA Prox  93      33      1-39%   heterogenous               +----------+--------+--------+--------+------------------+--------+ ICA Distal61      20                                          +----------+--------+--------+--------+------------------+--------+ ECA       95                                                 +----------+--------+--------+--------+------------------+--------+ +----------+--------+--------+--------+-------------------+  PSV cm/sEDV cm/sDescribeArm Pressure (mmHG) +----------+--------+--------+--------+-------------------+ [id number]                                          +----------+--------+--------+--------+-------------------+ +---------+--------+--+--------+-+---------+ VertebralPSV cm/s31EDV cm/s7Antegrade +---------+--------+--+--------+-+---------+   Summary: Right Carotid: Evidence consistent with a total occlusion of the right ICA. Left Carotid: Velocities in the left ICA are consistent with a 1-39% stenosis. Vertebrals: Bilateral vertebral arteries demonstrate antegrade flow. *See table(s) above for measurements and observations.  Electronically signed by Monica Martinez MD on 03/22/2020 at 4:46:21 PM.    Final    CT Angio Abd/Pel w/ and/or w/o  Result Date: 03/22/2020 CLINICAL DATA:  Inpatient. Pre-TAVR evaluation. Severe aortic stenosis with chronic systolic heart failure. 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 EXAM: CT ANGIOGRAPHY CHEST, ABDOMEN AND PELVIS TECHNIQUE: Non-contrast CT of the chest was initially obtained. Multidetector CT imaging through the chest, abdomen and pelvis was performed using the standard protocol during bolus administration of intravenous contrast. Multiplanar reconstructed images and MIPs were obtained and reviewed to evaluate the vascular anatomy. CONTRAST:  129mL OMNIPAQUE IOHEXOL 350 MG/ML SOLN COMPARISON:  03/15/2020 chest CT. 12/11/2018 PET-CT. FINDINGS: CTA CHEST FINDINGS Cardiovascular: Mild cardiomegaly. Diffuse thickening and coarse calcification of the aortic valve. Small pericardial effusion, similar. Right internal jugular Port-A-Cath terminates in  the lower third of the SVC. Left anterior descending and right coronary atherosclerosis. Atherosclerotic nonaneurysmal thoracic aorta. Normal caliber pulmonary arteries. No central pulmonary emboli. Mediastinum/Nodes: No discrete thyroid nodules. Unremarkable esophagus. No pathologically enlarged axillary, mediastinal or hilar lymph nodes. Lungs/Pleura: No pneumothorax. Small to moderate dependent bilateral pleural effusions, unchanged. Moderate to severe centrilobular emphysema with diffuse bronchial wall thickening. Sharply marginated lower left perihilar consolidation with associated mild bronchiectasis, volume loss and distortion, unchanged, compatible with postradiation change. Moderate passive atelectasis in the dependent lower lobes bilaterally. No acute interval consolidation. No significant pulmonary nodules. Musculoskeletal: No aggressive appearing focal osseous lesions. Chronic severe T7 vertebral compression fracture. Mild thoracic spondylosis. CTA ABDOMEN AND PELVIS FINDINGS Hepatobiliary: Normal liver size. A few scattered subcentimeter hypodense liver lesions are too small to characterize and are not appreciably changed since 12/11/2018 PET-CT, considered benign. No new liver lesions. Normal gallbladder with no radiopaque cholelithiasis. No biliary ductal dilatation. Pancreas: Normal, with no mass or duct dilation. Spleen: Normal size. No mass. Adrenals/Urinary Tract: Normal adrenals. No hydronephrosis. Exophytic hyperdense 2.8 cm renal cortical lesion in the medial upper left kidney (series 9/image 105) and exophytic isodense 2.2 cm renal cortical lesion in the far upper left kidney (series 9/image 97), neither appreciably changed size since 12/11/2018 PET-CT. Numerous simple renal cortical cysts in both kidneys, largest an exophytic simple 6.3 cm renal cortical cyst in the posterior lower left kidney. Numerous subcentimeter hypodense renal cortical lesions in both kidneys are too small to  characterize. Normal bladder. Stomach/Bowel: Normal non-distended stomach. Normal caliber small bowel with no small bowel wall thickening. Appendix not discretely visualized. Marked sigmoid diverticulosis with no large bowel wall thickening or significant pericolonic fat stranding. Vascular/Lymphatic: Atherosclerotic abdominal aorta with ectatic 2.5 cm infrarenal abdominal aorta. No pathologically enlarged lymph nodes in the abdomen or pelvis. Reproductive: Mildly enlarged prostate. Other: No pneumoperitoneum, ascites or focal fluid collection. Musculoskeletal: No aggressive appearing focal osseous lesions. Mild lumbar spondylosis. VASCULAR MEASUREMENTS PERTINENT TO TAVR: AORTA: Minimal Aortic Diameter-13.2 x 10.0 mm Severity of Aortic Calcification-moderate to severe RIGHT PELVIS: Right Common Iliac Artery -  Minimal Diameter-8.0 x 4.1 mm Tortuosity-moderate Calcification-severe Right External Iliac Artery - Minimal Diameter-6.5 x 4.7 mm Tortuosity-mild Calcification-moderate Right Common Femoral Artery - Minimal Diameter-5.9 x 3.5 mm Tortuosity-mild Calcification-severe LEFT PELVIS: Left Common Iliac Artery - Minimal Diameter-6.8 x 5.7 mm Tortuosity-mild Calcification-severe Left External Iliac Artery - Minimal Diameter-5.3 x 4.2 mm Tortuosity-mild-to-moderate Calcification-severe Left Common Femoral Artery - Minimal Diameter-6.2 x 4.7 mm Tortuosity-mild Calcification-severe Review of the MIP images confirms the above findings. IMPRESSION: 1. Vascular findings and measurements pertinent to potential TAVR procedure, as detailed. 2. Diffuse thickening and coarse calcification of the aortic valve, compatible with the reported history of severe aortic stenosis. 3. Stable postradiation change in the lower left perihilar lung. No evidence of local tumor recurrence. No findings of metastatic disease. 4. Stable small to moderate dependent bilateral pleural effusions. 5. Mild cardiomegaly. Small pericardial effusion,  similar. Two-vessel coronary atherosclerosis. 6. Marked sigmoid diverticulosis. 7. Mildly enlarged prostate. 8. Aortic Atherosclerosis (ICD10-I70.0) and Emphysema (ICD10-J43.9). Electronically Signed   By: Ilona Sorrel M.D.   On: 03/22/2020 14:03    Cardiac Studies    Echo: IMPRESSIONS   1. Hypokinesis of the distal anteroseptal wall and apex; overall moderate  to severe LV dysfunction; calcified aortic valve with probable severe AS  (mean gradient 31 mmHg; AVA 1 cm2) and mild AI; mild MR; mild LAE; small  pericardial effusion with RA  inversion.  2. Left ventricular ejection fraction, by estimation, is 30 to 35%. The  left ventricle has moderate to severely decreased function. The left  ventricle has no regional wall motion abnormalities. The left ventricular  internal cavity size was mildly  dilated. There is mild left ventricular hypertrophy. Left ventricular  diastolic parameters are indeterminate. Elevated left atrial pressure.  3. Right ventricular systolic function is normal. The right ventricular  size is normal. There is mildly elevated pulmonary artery systolic  pressure.  4. Left atrial size was mildly dilated.  5. The mitral valve is normal in structure. Mild mitral valve  regurgitation. No evidence of mitral stenosis.  6. The aortic valve has an indeterminant number of cusps. Aortic valve  regurgitation is mild. No aortic stenosis is present.    Cath 1.  Calcific, nonobstructive coronary artery disease as outlined 2.  Preserved cardiac output of 7.56 L/min and cardiac index 4.16 3.  Known severe calcific aortic stenosis (stage D2 low-flow low gradient) with inability to cross the aortic valve with a wire 4.  Intracardiac hemodynamics suggestive of congestive heart failure with pulmonary capillary wedge pressure 31 mmHg, PA pressure 52/29 with a mean of 40  Patient Profile     79 y.o. male  from [place] with severe aortic stenosis, chronic systolic heart  failure, lung cancer with favorable prognosis  Assessment & Plan    AS:  Note CT completed yesterday with aorta and peripheral vessels described in results.   Other findings seem to be stable compared with prior CTs and PET scans.   Plan TAVR tomorrow.     ACUTE ON CHRONIC SYSTOLIC HF:  EF is reduced compared to previous.  Net negative 8.2 liters since admission.  PM Lasix held with low BP.  Will change to once daily PO Lasix.    PERICARDIAL EFFUSION:  Volume is small.   Improved compared with out patient studies.  No further therapy.  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  Continue with aggressive risk reduction.    Transfer to [employer].     For questions  or updates, please contact [place] Please consult [email or web address] for contact info under Cardiology/STEMI.   Signed, Minus Breeding, MD  03/23/2020, 8:00 AM

## 2020-03-23 NOTE — Progress Notes (Signed)
PROGRESS NOTE    Robert Dixon  WJX:[id number] DOB: Feb 14, 1941 DOA: 03/15/2020 PCP: [name], MD   Brief Narrative:  Robert Dixon is a 79 y.o. malewith medical history significant forlung cancer, COPD on chronic 3 L oxygen, aortic stenosis, pericardial effusion. Patient presented secondary to dyspnea with concern for a COPD exacerbation. On admission he required BiPAP and was given IV lasix for noted pleural effusions, his work-up was significant for significant drop in EF from 60% in January of this year, to 30% this admission, he had right/left cardiac cath/23, significant for severe aortic stenosis, with calcified nonobstructive CAD, to [name] 4/23 for evaluation by structural heart team regarding TAVR, plan for patient to have TAVR 4/28.  Subjective:  Reports his dyspnea at baseline, worsening with activity, reports good appetite, denies any chest pain.   Assessment & Plan:   Principal Problem:   Acute on chronic respiratory failure with hypoxia (HCC) Active Problems:   Non-small cell carcinoma of left lung, stage 3 (HCC)   Pleural effusion   Severe aortic stenosis   Pericardial effusion   COPD exacerbation (HCC)   Hypoxic respiratory failure . -At baseline patient on 3 L nasal cannula, he is hypoxemic on presentation, requiring BiPAP initially, he has no BiPAP requirement for last few days.  -This appears to be mainly due to limb overload/pleural effusion secondary to aortic valve disease, with some COPD flare in the beginning, with underlining history of lung cancer .. - He is back to baseline on 3 L nasal cannula at rest, goes up to 6 L with activity  Acute on chronic systolic CHF -Significant drop of EF 30%, with elevated BNP on presentation, with pleural effusion,. -Severe aortic stenosis contributing to his CHF, management per cardiology, currently on p.o. diuresis, this is managed by cardiology .  Severe aortic stenosis -Management per cardiology,  followed by Dr. Burt Knack, family decided to proceed with TAVR. -Per previous discussion with oncology, his lung malignancy with good response to immunotherapy, with CT scan negative for mass, so this should not preclude him from surgery if felt indicated. -Plan  for TAVR tomorrow  left side pleural effusion, transudative: Follow cytology reactive mesothelia cell.  - Underwent thoracentesis on 4/20 , cytology only significant for mesothelial cells.  stage IIIb non-small lung cancer: With Dr. Julien Nordmann -Cardiology discussion with his primary oncologist Dr. Earlie Server, patient is having a good response to immunotherapy.  CT scan was negative for mass.  He has a good short/intermediate prognosis  COPD exacerbation:  - Continue with Pulmicort Brovana and scheduled nebulizers, initially on IV steroids, now steroid has been tapered. .  Leukocytosis: Suspect related to her steroid.  Estimated body mass index is 23 kg/m as calculated from the following:   Height as of this encounter: 5\' 8"  (1.727 m).   Weight as of this encounter: 68.6 kg.   DVT prophylaxis: Heparin Code Status: DNR Family Communication: None at bedside Disposition Plan:  Patient will go for TAVR tomorrow, as discussed with Dr. Percival Spanish, patient will be transferred to heart care service as his main hospital stay currently is due to TAVR.  [facility] will sign off, please call us if any questions or concerns.  Consultants:   Cardiology  Pulmonology  Procedures:   Right/left cardiac cath 4/23  Antimicrobials:      Objective: Vitals:   03/23/20 0828 03/23/20 0829 03/23/20 0832 03/23/20 1120  BP:   102/67 97/61  Pulse:   99 85  Resp:   20 19  Temp:    98.2 F (36.8 C)  TempSrc:   Oral Oral  SpO2: 97% 97% 96% 99%  Weight:      Height:        Intake/Output Summary (Last 24 hours) at 03/23/2020 1349 Last data filed at 03/23/2020 0900 Gross per 24 hour  Intake 600 ml  Output 1825 ml  Net -1225 ml   Filed Weights    03/21/20 0448 03/22/20 0432 03/23/20 0614  Weight: 69.7 kg 68.8 kg 68.6 kg    Examination:  Awake Alert, Oriented X 3, No new F.N deficits, Normal affect Symmetrical Chest wall movement, Good air movement bilaterally, CTAB RRR,No Gallops,Rubs ,+ Murmurs, No Parasternal Heave +ve B.Sounds, Abd Soft, No tenderness, No rebound - guarding or rigidity. No Cyanosis, Clubbing or edema, No new Rash or bruise          Data Reviewed: I have personally reviewed following labs and imaging studies  CBC: Recent Labs  Lab 03/19/20 0442 03/19/20 1554 03/19/20 1556 03/20/20 0334 03/21/20 0543 03/22/20 0445 03/23/20 0309  WBC 11.0*  --   --  13.4* 12.0* 10.4 8.0  HGB 9.8*   < > 9.9* 11.2* 11.3* 10.7* 11.6*  HCT 30.3*   < > 29.0* 34.6* 34.4* 33.6* 35.6*  MCV 125.2*  --   --  124.0* 122.4* 121.7* 119.5*  PLT 200  --   --  257 242 213 207   < > = values in this interval not displayed.   Basic Metabolic Panel: Recent Labs  Lab 03/19/20 0442 03/19/20 1554 03/19/20 1556 03/20/20 0334 03/21/20 0543 03/22/20 0445 03/23/20 0309  NA 143   < > 146* 143 143 142 135  K 3.7   < > 3.6 3.8 4.1 3.7 3.1*  CL 106  --   --  105 102 100 95*  CO2 27  --   --  29 32 34* 31  GLUCOSE 206*  --   --  172* 123* 130* 179*  BUN 48*  --   --  45* 42* 41* 35*  CREATININE 1.41*  --   --  1.25* 1.21 1.24 1.31*  CALCIUM 8.4*  --   --  8.7* 8.6* 8.3* 8.3*   < > = values in this interval not displayed.   GFR: Estimated Creatinine Clearance: 45 mL/min (A) (by C-G formula based on SCr of 1.31 mg/dL (H)). Liver Function Tests: No results for input(s): AST, ALT, ALKPHOS, BILITOT, PROT, ALBUMIN in the last 168 hours. No results for input(s): LIPASE, AMYLASE in the last 168 hours. No results for input(s): AMMONIA in the last 168 hours. Coagulation Profile: No results for input(s): INR, PROTIME in the last 168 hours. Cardiac Enzymes: No results for input(s): CKTOTAL, CKMB, CKMBINDEX, TROPONINI in the last 168  hours. BNP (last 3 results) No results for input(s): PROBNP in the last 8760 hours. HbA1C: No results for input(s): HGBA1C in the last 72 hours. CBG: No results for input(s): GLUCAP in the last 168 hours. Lipid Profile: No results for input(s): CHOL, HDL, LDLCALC, TRIG, CHOLHDL, LDLDIRECT in the last 72 hours. Thyroid Function Tests: No results for input(s): TSH, T4TOTAL, FREET4, T3FREE, THYROIDAB in the last 72 hours. Anemia Panel: No results for input(s): VITAMINB12, FOLATE, FERRITIN, TIBC, IRON, RETICCTPCT in the last 72 hours. Sepsis Labs: Recent Labs  Lab 03/17/20 0525  PROCALCITON <0.10    Recent Results (from the past 240 hour(s))  Respiratory Panel by RT PCR (Flu A&B, Covid) - Nasopharyngeal Swab  Status: None   Collection Time: 03/15/20 12:13 PM   Specimen: Nasopharyngeal Swab  Result Value Ref Range Status   SARS Coronavirus 2 by RT PCR NEGATIVE NEGATIVE Final    Comment: (NOTE) SARS-CoV-2 target nucleic acids are NOT DETECTED. The SARS-CoV-2 RNA is generally detectable in upper respiratoy specimens during the acute phase of infection. The lowest concentration of SARS-CoV-2 viral copies this assay can detect is 131 copies/mL. A negative result does not preclude SARS-Cov-2 infection and should not be used as the sole basis for treatment or other patient management decisions. A negative result may occur with  improper specimen collection/handling, submission of specimen other than nasopharyngeal swab, presence of viral mutation(s) within the areas targeted by this assay, and inadequate number of viral copies (<131 copies/mL). A negative result must be combined with clinical observations, patient history, and epidemiological information. The expected result is Negative. Fact Sheet for Patients:  PinkCheek.be Fact Sheet for Healthcare Providers:  GravelBags.it This test is not yet ap proved or cleared by the  [place] FDA and  has been authorized for detection and/or diagnosis of SARS-CoV-2 by FDA under an Emergency Use Authorization (EUA). This EUA will remain  in effect (meaning this test can be used) for the duration of the COVID-19 declaration under Section 564(b)(1) of the Act, 21 U.S.C. section 360bbb-3(b)(1), unless the authorization is terminated or revoked sooner.    Influenza A by PCR NEGATIVE NEGATIVE Final   Influenza B by PCR NEGATIVE NEGATIVE Final    Comment: (NOTE) The Xpert Xpress SARS-CoV-2/FLU/RSV assay is intended as an aid in  the diagnosis of influenza from Nasopharyngeal swab specimens and  should not be used as a sole basis for treatment. Nasal washings and  aspirates are unacceptable for Xpert Xpress SARS-CoV-2/FLU/RSV  testing. Fact Sheet for Patients: PinkCheek.be Fact Sheet for Healthcare Providers: GravelBags.it This test is not yet approved or cleared by the [place] FDA and  has been authorized for detection and/or diagnosis of SARS-CoV-2 by  FDA under an Emergency Use Authorization (EUA). This EUA will remain  in effect (meaning this test can be used) for the duration of the  Covid-19 declaration under Section 564(b)(1) of the Act, 21  U.S.C. section 360bbb-3(b)(1), unless the authorization is  terminated or revoked. Performed at [facility], [place] [address], [place], [place] [address]   Culture, blood (routine x 2)     Status: None   Collection Time: 03/15/20 12:13 PM   Specimen: BLOOD  Result Value Ref Range Status   Specimen Description   Final    BLOOD SITE NOT SPECIFIED Performed at [facility] [address] [place], [place] [address]    Special Requests   Final    BOTTLES DRAWN AEROBIC AND ANAEROBIC Blood Culture adequate volume Performed at [facility] [address], [place], [place] [address]    Culture   Final     NO GROWTH 5 DAYS Performed at [facility], [place] [address], [place], [place] [address]    Report Status 03/20/2020 FINAL  Final  Culture, blood (routine x 2)     Status: None   Collection Time: 03/15/20 12:13 PM   Specimen: BLOOD  Result Value Ref Range Status   Specimen Description   Final    BLOOD SITE NOT SPECIFIED Performed at [facility] [address], [place], [place] [address]    Special Requests   Final    BOTTLES DRAWN AEROBIC  AND ANAEROBIC Blood Culture adequate volume Performed at [facility] [address] [place], [place] [address]    Culture   Final    NO GROWTH 5 DAYS Performed at [facility], [place] [address], [place], [place] [address]    Report Status 03/20/2020 FINAL  Final  MRSA PCR Screening     Status: None   Collection Time: 03/15/20  9:22 PM   Specimen: Nasal Mucosa; Nasopharyngeal  Result Value Ref Range Status   MRSA by PCR NEGATIVE NEGATIVE Final    Comment:        The GeneXpert MRSA Assay (FDA approved for NASAL specimens only), is one component of a comprehensive MRSA colonization surveillance program. It is not intended to diagnose MRSA infection nor to guide or monitor treatment for MRSA infections. Performed at [facility], [place] [address], [place], [place] [address]   Culture, body fluid-bottle     Status: None   Collection Time: 03/16/20  4:06 PM   Specimen: Fluid  Result Value Ref Range Status   Specimen Description FLUID PLEURAL  Final   Special Requests   Final    BOTTLES DRAWN AEROBIC AND ANAEROBIC Blood Culture adequate volume   Culture   Final    NO GROWTH 5 DAYS Performed at [facility], [place] [address], [place], [place] [address]    Report Status 03/21/2020 FINAL  Final  Gram stain     Status: None   Collection Time: 03/16/20  4:06 PM   Specimen: Fluid  Result Value Ref Range Status   Specimen Description FLUID PLEURAL  Final   Special  Requests SYRINGE  Final   Gram Stain   Final    MODERATE WBC PRESENT, PREDOMINANTLY MONONUCLEAR NO ORGANISMS SEEN Performed at [facility], [place] [address] [place], [place] [address]    Report Status 03/16/2020 FINAL  Final         Radiology Studies: CT CORONARY MORPH W/CTA COR W/SCORE W/CA W/CM &/OR WO/CM  Addendum Date: 03/22/2020   ADDENDUM REPORT: 03/22/2020 19:51 CLINICAL DATA:  79 year old male with severe aortic stenosis being evaluated for a TAVR procedure. EXAM: Cardiac TAVR CT TECHNIQUE: The patient was scanned on a [employer]. A 120 kV retrospective scan was triggered in the descending thoracic aorta at 111 HU's. Gantry rotation speed was 250 msecs and collimation was .6 mm. 10 mg of IV Metoprolol were given. The 3D data set was reconstructed in 5% intervals of the R-R cycle. Systolic and diastolic phases were analyzed on a dedicated work station using MPR, MIP and VRT modes. The patient received 80 cc of contrast. FINDINGS: Aortic Valve: Trileaflet aortic valve with severely thickened and calcified leaflets and no calcifications extending into the LVOT. Aorta: Normal size, no dissection, mild diffuse atherosclerotic plaque and calcifications. Sinotubular Junction: 32 x 31 mm Ascending Thoracic Aorta: 34 x 34 mm Aortic Arch: 26 x 26 mm Descending Thoracic Aorta: 25 x 25 mm Sinus of Valsalva Measurements: Non-coronary: 33 mm Right -coronary: 33 mm Left -coronary: 34 mm Coronary Artery Height above Annulus: Left Main: 14 mm Right Coronary: 18 mm Virtual Basal Annulus Measurements: Maximum/Minimum Diameter: 27.4 x 23.3 mm Mean Diameter: 24.6 mm Perimeter: 79.1 mm Area: 475 mm2 Optimum Fluoroscopic Angle for Delivery: LAO 12 CAU 13 IMPRESSION: 1. Trileaflet aortic valve with severely thickened and calcified leaflets and no calcifications extending into the LVOT. Aortic valve calcium score is 3216 consistent with severe aortic stenosis. Annular  measurements suitable for  delivery of a 26 mm Edwards-SAPIEN 3 Ultra valve. 2. Sufficient coronary to annulus distance. 3. Optimum Fluoroscopic Angle for Delivery: LAO 12 CAU 13. 4. No thrombus in the left atrial appendage. Electronically Signed   By: Ena Dawley   On: 03/22/2020 19:51   Result Date: 03/22/2020 EXAM: OVER-READ INTERPRETATION  CT CHEST The following report is an over-read performed by radiologist Dr. Salvatore Marvel of [facility], [place] on 03/22/2020. This over-read does not include interpretation of cardiac or coronary anatomy or pathology. The coronary CTA interpretation by the cardiologist is attached. COMPARISON:  03/15/2020 chest CT. FINDINGS: Please see the separate concurrent chest CT angiogram report for details. IMPRESSION: Please see the separate concurrent chest CT angiogram report for details. Electronically Signed: By: Ilona Sorrel M.D. On: 03/22/2020 11:40   CT ANGIO CHEST AORTA W/CM & OR WO/CM  Result Date: 03/22/2020 CLINICAL DATA:  Inpatient. Pre-TAVR evaluation. Severe aortic stenosis with chronic systolic heart failure. 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 EXAM: CT ANGIOGRAPHY CHEST, ABDOMEN AND PELVIS TECHNIQUE: Non-contrast CT of the chest was initially obtained. Multidetector CT imaging through the chest, abdomen and pelvis was performed using the standard protocol during bolus administration of intravenous contrast. Multiplanar reconstructed images and MIPs were obtained and reviewed to evaluate the vascular anatomy. CONTRAST:  156mL OMNIPAQUE IOHEXOL 350 MG/ML SOLN COMPARISON:  03/15/2020 chest CT. 12/11/2018 PET-CT. FINDINGS: CTA CHEST FINDINGS Cardiovascular: Mild cardiomegaly. Diffuse thickening and coarse calcification of the aortic valve. Small pericardial effusion, similar. Right internal jugular Port-A-Cath terminates in the lower third of the SVC. Left anterior descending and right coronary atherosclerosis. Atherosclerotic nonaneurysmal  thoracic aorta. Normal caliber pulmonary arteries. No central pulmonary emboli. Mediastinum/Nodes: No discrete thyroid nodules. Unremarkable esophagus. No pathologically enlarged axillary, mediastinal or hilar lymph nodes. Lungs/Pleura: No pneumothorax. Small to moderate dependent bilateral pleural effusions, unchanged. Moderate to severe centrilobular emphysema with diffuse bronchial wall thickening. Sharply marginated lower left perihilar consolidation with associated mild bronchiectasis, volume loss and distortion, unchanged, compatible with postradiation change. Moderate passive atelectasis in the dependent lower lobes bilaterally. No acute interval consolidation. No significant pulmonary nodules. Musculoskeletal: No aggressive appearing focal osseous lesions. Chronic severe T7 vertebral compression fracture. Mild thoracic spondylosis. CTA ABDOMEN AND PELVIS FINDINGS Hepatobiliary: Normal liver size. A few scattered subcentimeter hypodense liver lesions are too small to characterize and are not appreciably changed since 12/11/2018 PET-CT, considered benign. No new liver lesions. Normal gallbladder with no radiopaque cholelithiasis. No biliary ductal dilatation. Pancreas: Normal, with no mass or duct dilation. Spleen: Normal size. No mass. Adrenals/Urinary Tract: Normal adrenals. No hydronephrosis. Exophytic hyperdense 2.8 cm renal cortical lesion in the medial upper left kidney (series 9/image 105) and exophytic isodense 2.2 cm renal cortical lesion in the far upper left kidney (series 9/image 97), neither appreciably changed size since 12/11/2018 PET-CT. Numerous simple renal cortical cysts in both kidneys, largest an exophytic simple 6.3 cm renal cortical cyst in the posterior lower left kidney. Numerous subcentimeter hypodense renal cortical lesions in both kidneys are too small to characterize. Normal bladder. Stomach/Bowel: Normal non-distended stomach. Normal caliber small bowel with no small bowel wall  thickening. Appendix not discretely visualized. Marked sigmoid diverticulosis with no large bowel wall thickening or significant pericolonic fat stranding. Vascular/Lymphatic: Atherosclerotic abdominal aorta with ectatic 2.5 cm infrarenal abdominal aorta. No pathologically enlarged lymph nodes in the abdomen or pelvis. Reproductive: Mildly enlarged prostate. Other: No pneumoperitoneum, ascites or focal fluid collection. Musculoskeletal: No aggressive appearing focal  osseous lesions. Mild lumbar spondylosis. VASCULAR MEASUREMENTS PERTINENT TO TAVR: AORTA: Minimal Aortic Diameter-13.2 x 10.0 mm Severity of Aortic Calcification-moderate to severe RIGHT PELVIS: Right Common Iliac Artery - Minimal Diameter-8.0 x 4.1 mm Tortuosity-moderate Calcification-severe Right External Iliac Artery - Minimal Diameter-6.5 x 4.7 mm Tortuosity-mild Calcification-moderate Right Common Femoral Artery - Minimal Diameter-5.9 x 3.5 mm Tortuosity-mild Calcification-severe LEFT PELVIS: Left Common Iliac Artery - Minimal Diameter-6.8 x 5.7 mm Tortuosity-mild Calcification-severe Left External Iliac Artery - Minimal Diameter-5.3 x 4.2 mm Tortuosity-mild-to-moderate Calcification-severe Left Common Femoral Artery - Minimal Diameter-6.2 x 4.7 mm Tortuosity-mild Calcification-severe Review of the MIP images confirms the above findings. IMPRESSION: 1. Vascular findings and measurements pertinent to potential TAVR procedure, as detailed. 2. Diffuse thickening and coarse calcification of the aortic valve, compatible with the reported history of severe aortic stenosis. 3. Stable postradiation change in the lower left perihilar lung. No evidence of local tumor recurrence. No findings of metastatic disease. 4. Stable small to moderate dependent bilateral pleural effusions. 5. Mild cardiomegaly. Small pericardial effusion, similar. Two-vessel coronary atherosclerosis. 6. Marked sigmoid diverticulosis. 7. Mildly enlarged prostate. 8. Aortic  Atherosclerosis (ICD10-I70.0) and Emphysema (ICD10-J43.9). Electronically Signed   By: Ilona Sorrel M.D.   On: 03/22/2020 14:03   VAS US CAROTID  Result Date: 03/22/2020 Carotid Arterial Duplex Study Indications:       Pre tavr. Risk Factors:      Hypertension. Comparison Study:  no prior Performing Technologist: Abram Sander RVS  Examination Guidelines: A complete evaluation includes B-mode imaging, spectral Doppler, color Doppler, and power Doppler as needed of all accessible portions of each vessel. Bilateral testing is considered an integral part of a complete examination. Limited examinations for reoccurring indications may be performed as noted.  Right Carotid Findings: +----------+--------+--------+--------+------------------+--------+           PSV cm/sEDV cm/sStenosisPlaque DescriptionComments +----------+--------+--------+--------+------------------+--------+ CCA Prox  26                                                 +----------+--------+--------+--------+------------------+--------+ CCA Distal21      4                                          +----------+--------+--------+--------+------------------+--------+ ICA Prox                  Occluded                           +----------+--------+--------+--------+------------------+--------+ ICA Mid                   Occluded                           +----------+--------+--------+--------+------------------+--------+ ICA Distal                Occluded                           +----------+--------+--------+--------+------------------+--------+ ECA       131                                                +----------+--------+--------+--------+------------------+--------+ +----------+--------+-------+--------+-------------------+  PSV cm/sEDV cmsDescribeArm Pressure (mmHG) +----------+--------+-------+--------+-------------------+ [id number]                                          +----------+--------+-------+--------+-------------------+ +---------+--------+--+--------+-+---------+ VertebralPSV cm/s28EDV cm/s8Antegrade +---------+--------+--+--------+-+---------+  Left Carotid Findings: +----------+--------+--------+--------+------------------+--------+           PSV cm/sEDV cm/sStenosisPlaque DescriptionComments +----------+--------+--------+--------+------------------+--------+ CCA Prox  67      14              heterogenous               +----------+--------+--------+--------+------------------+--------+ CCA Distal67      18              heterogenous               +----------+--------+--------+--------+------------------+--------+ ICA Prox  93      33      1-39%   heterogenous               +----------+--------+--------+--------+------------------+--------+ ICA Distal61      20                                         +----------+--------+--------+--------+------------------+--------+ ECA       95                                                 +----------+--------+--------+--------+------------------+--------+ +----------+--------+--------+--------+-------------------+           PSV cm/sEDV cm/sDescribeArm Pressure (mmHG) +----------+--------+--------+--------+-------------------+ [id number]                                          +----------+--------+--------+--------+-------------------+ +---------+--------+--+--------+-+---------+ VertebralPSV cm/s31EDV cm/s7Antegrade +---------+--------+--+--------+-+---------+   Summary: Right Carotid: Evidence consistent with a total occlusion of the right ICA. Left Carotid: Velocities in the left ICA are consistent with a 1-39% stenosis. Vertebrals: Bilateral vertebral arteries demonstrate antegrade flow. *See table(s) above for measurements and observations.  Electronically signed by Monica Martinez MD on 03/22/2020 at 4:46:21 PM.    Final    CT Angio Abd/Pel w/ and/or  w/o  Result Date: 03/22/2020 CLINICAL DATA:  Inpatient. Pre-TAVR evaluation. Severe aortic stenosis with chronic systolic heart failure. 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 EXAM: CT ANGIOGRAPHY CHEST, ABDOMEN AND PELVIS TECHNIQUE: Non-contrast CT of the chest was initially obtained. Multidetector CT imaging through the chest, abdomen and pelvis was performed using the standard protocol during bolus administration of intravenous contrast. Multiplanar reconstructed images and MIPs were obtained and reviewed to evaluate the vascular anatomy. CONTRAST:  114mL OMNIPAQUE IOHEXOL 350 MG/ML SOLN COMPARISON:  03/15/2020 chest CT. 12/11/2018 PET-CT. FINDINGS: CTA CHEST FINDINGS Cardiovascular: Mild cardiomegaly. Diffuse thickening and coarse calcification of the aortic valve. Small pericardial effusion, similar. Right internal jugular Port-A-Cath terminates in the lower third of the SVC. Left anterior descending and right coronary atherosclerosis. Atherosclerotic nonaneurysmal thoracic aorta. Normal caliber pulmonary arteries. No central pulmonary emboli. Mediastinum/Nodes: No discrete thyroid nodules. Unremarkable esophagus. No pathologically enlarged axillary, mediastinal or hilar lymph nodes. Lungs/Pleura: No pneumothorax. Small to moderate dependent bilateral pleural effusions, unchanged. Moderate to severe centrilobular  emphysema with diffuse bronchial wall thickening. Sharply marginated lower left perihilar consolidation with associated mild bronchiectasis, volume loss and distortion, unchanged, compatible with postradiation change. Moderate passive atelectasis in the dependent lower lobes bilaterally. No acute interval consolidation. No significant pulmonary nodules. Musculoskeletal: No aggressive appearing focal osseous lesions. Chronic severe T7 vertebral compression fracture. Mild thoracic spondylosis. CTA ABDOMEN AND PELVIS FINDINGS Hepatobiliary: Normal liver size. A few  scattered subcentimeter hypodense liver lesions are too small to characterize and are not appreciably changed since 12/11/2018 PET-CT, considered benign. No new liver lesions. Normal gallbladder with no radiopaque cholelithiasis. No biliary ductal dilatation. Pancreas: Normal, with no mass or duct dilation. Spleen: Normal size. No mass. Adrenals/Urinary Tract: Normal adrenals. No hydronephrosis. Exophytic hyperdense 2.8 cm renal cortical lesion in the medial upper left kidney (series 9/image 105) and exophytic isodense 2.2 cm renal cortical lesion in the far upper left kidney (series 9/image 97), neither appreciably changed size since 12/11/2018 PET-CT. Numerous simple renal cortical cysts in both kidneys, largest an exophytic simple 6.3 cm renal cortical cyst in the posterior lower left kidney. Numerous subcentimeter hypodense renal cortical lesions in both kidneys are too small to characterize. Normal bladder. Stomach/Bowel: Normal non-distended stomach. Normal caliber small bowel with no small bowel wall thickening. Appendix not discretely visualized. Marked sigmoid diverticulosis with no large bowel wall thickening or significant pericolonic fat stranding. Vascular/Lymphatic: Atherosclerotic abdominal aorta with ectatic 2.5 cm infrarenal abdominal aorta. No pathologically enlarged lymph nodes in the abdomen or pelvis. Reproductive: Mildly enlarged prostate. Other: No pneumoperitoneum, ascites or focal fluid collection. Musculoskeletal: No aggressive appearing focal osseous lesions. Mild lumbar spondylosis. VASCULAR MEASUREMENTS PERTINENT TO TAVR: AORTA: Minimal Aortic Diameter-13.2 x 10.0 mm Severity of Aortic Calcification-moderate to severe RIGHT PELVIS: Right Common Iliac Artery - Minimal Diameter-8.0 x 4.1 mm Tortuosity-moderate Calcification-severe Right External Iliac Artery - Minimal Diameter-6.5 x 4.7 mm Tortuosity-mild Calcification-moderate Right Common Femoral Artery - Minimal Diameter-5.9 x 3.5 mm  Tortuosity-mild Calcification-severe LEFT PELVIS: Left Common Iliac Artery - Minimal Diameter-6.8 x 5.7 mm Tortuosity-mild Calcification-severe Left External Iliac Artery - Minimal Diameter-5.3 x 4.2 mm Tortuosity-mild-to-moderate Calcification-severe Left Common Femoral Artery - Minimal Diameter-6.2 x 4.7 mm Tortuosity-mild Calcification-severe Review of the MIP images confirms the above findings. IMPRESSION: 1. Vascular findings and measurements pertinent to potential TAVR procedure, as detailed. 2. Diffuse thickening and coarse calcification of the aortic valve, compatible with the reported history of severe aortic stenosis. 3. Stable postradiation change in the lower left perihilar lung. No evidence of local tumor recurrence. No findings of metastatic disease. 4. Stable small to moderate dependent bilateral pleural effusions. 5. Mild cardiomegaly. Small pericardial effusion, similar. Two-vessel coronary atherosclerosis. 6. Marked sigmoid diverticulosis. 7. Mildly enlarged prostate. 8. Aortic Atherosclerosis (ICD10-I70.0) and Emphysema (ICD10-J43.9). Electronically Signed   By: Ilona Sorrel M.D.   On: 03/22/2020 14:03        Scheduled Meds: . arformoterol  15 mcg Nebulization BID  . aspirin  81 mg Oral Daily  . budesonide (PULMICORT) nebulizer solution  0.5 mg Nebulization BID  . chlorhexidine  15 mL Mouth Rinse BID  . Chlorhexidine Gluconate Cloth  6 each Topical Daily  . docusate sodium  100 mg Oral BID  . furosemide  40 mg Oral Daily  . heparin injection (subcutaneous)  5,000 Units Subcutaneous Q8H  . mouth rinse  15 mL Mouth Rinse q12n4p  . pantoprazole  40 mg Oral BID  . polycarbophil  625 mg Oral BID  . sodium chloride flush  10-40 mL Intracatheter Q12H  .  sodium chloride flush  3 mL Intravenous Q12H  . sodium chloride flush  3 mL Intravenous Q12H  . tamsulosin  0.4 mg Oral Daily   Continuous Infusions: . sodium chloride       LOS: 8 days       Phillips Climes, MD [facility]   If 7PM-7AM, please contact night-coverage [email or web address]  03/23/2020, 1:49 PM

## 2020-03-23 NOTE — Progress Notes (Addendum)
Was called by Central Monitoring at 23:18 that patient had 12 bts V-Tach.at 23:04. Patient has Hx of E.F. 30-35% and I went into assess patient at 23:18 he was resting in bed with no complaints. Cont. To monitor pt and rhythm. Will inform R.N. of the above

## 2020-03-24 ENCOUNTER — Telehealth: Payer: Self-pay | Admitting: Pulmonary Disease

## 2020-03-24 ENCOUNTER — Inpatient Hospital Stay (HOSPITAL_COMMUNITY): Payer: Medicare HMO

## 2020-03-24 LAB — BASIC METABOLIC PANEL
Anion gap: 8 (ref 5–15)
BUN: 35 mg/dL — ABNORMAL HIGH (ref 8–23)
CO2: 34 mmol/L — ABNORMAL HIGH (ref 22–32)
Calcium: 8.1 mg/dL — ABNORMAL LOW (ref 8.9–10.3)
Chloride: 99 mmol/L (ref 98–111)
Creatinine, Ser: 1.12 mg/dL (ref 0.61–1.24)
GFR calc Af Amer: >60 mL/min (ref >60)
GFR calc non Af Amer: >60 mL/min (ref >60)
Glucose, Bld: 90 mg/dL (ref 70–99)
Potassium: 4.1 mmol/L (ref 3.5–5.1)
Sodium: 141 mmol/L (ref 135–145)

## 2020-03-24 LAB — CBC
HCT: 33.5 % — ABNORMAL LOW (ref 39.0–52.0)
Hemoglobin: 11 g/dL — ABNORMAL LOW (ref 13.0–17.0)
MCH: 40 pg — ABNORMAL HIGH (ref 26.0–34.0)
MCHC: 32.8 g/dL (ref 30.0–36.0)
MCV: 121.8 fL — ABNORMAL HIGH (ref 80.0–100.0)
Platelets: 193 10*3/uL (ref 150–400)
RBC: 2.75 MIL/uL — ABNORMAL LOW (ref 4.22–5.81)
RDW: 14.6 % (ref 11.5–15.5)
WBC: 6.9 10*3/uL (ref 4.0–10.5)
nRBC: 0 % (ref 0.0–0.2)

## 2020-03-24 LAB — URINALYSIS, ROUTINE W REFLEX MICROSCOPIC
Bilirubin Urine: NEGATIVE
Glucose, UA: NEGATIVE mg/dL
Hgb urine dipstick: NEGATIVE
Ketones, ur: NEGATIVE mg/dL
Leukocytes,Ua: NEGATIVE
Nitrite: NEGATIVE
Protein, ur: NEGATIVE mg/dL
Specific Gravity, Urine: 1.014 (ref 1.005–1.030)
pH: 6 (ref 5.0–8.0)

## 2020-03-24 MED ORDER — MAGNESIUM SULFATE 50 % IJ SOLN
40.0000 meq | INTRAMUSCULAR | Status: DC
  Filled 2020-03-24: qty 9.85

## 2020-03-24 MED ORDER — TEMAZEPAM 15 MG PO CAPS
15.0000 mg | ORAL_CAPSULE | Freq: Once | ORAL | Status: AC | PRN
  Administered 2020-03-25: 15 mg via ORAL
  Filled 2020-03-24: qty 1

## 2020-03-24 MED ORDER — NOREPINEPHRINE 4 MG/250ML-% IV SOLN
0.0000 ug/min | INTRAVENOUS | Status: AC
  Administered 2020-03-25: 1 ug/min via INTRAVENOUS
  Filled 2020-03-24: qty 250

## 2020-03-24 MED ORDER — FLUTICASONE-UMECLIDIN-VILANT 100-62.5-25 MCG/INH IN AEPB: 1.0000 | INHALATION_SPRAY | Freq: Every day | RESPIRATORY_TRACT | Status: DC

## 2020-03-24 MED ORDER — SODIUM CHLORIDE 0.9 % IV SOLN
1.5000 g | INTRAVENOUS | Status: AC
  Administered 2020-03-25: 1.5 g via INTRAVENOUS
  Filled 2020-03-24: qty 1.5

## 2020-03-24 MED ORDER — POTASSIUM CHLORIDE 2 MEQ/ML IV SOLN
80.0000 meq | INTRAVENOUS | Status: DC
  Filled 2020-03-24: qty 40

## 2020-03-24 MED ORDER — BISACODYL 5 MG PO TBEC: 5.0000 mg | DELAYED_RELEASE_TABLET | Freq: Once | ORAL | Status: DC

## 2020-03-24 MED ORDER — CHLORHEXIDINE GLUCONATE 0.12 % MT SOLN: 15.0000 mL | Freq: Once | OROMUCOSAL | Status: DC

## 2020-03-24 MED ORDER — FLUTICASONE FUROATE-VILANTEROL 100-25 MCG/INH IN AEPB
1.0000 | INHALATION_SPRAY | Freq: Every day | RESPIRATORY_TRACT | Status: DC
  Administered 2020-03-24 – 2020-03-27 (×3): 1 via RESPIRATORY_TRACT
  Filled 2020-03-24: qty 28

## 2020-03-24 MED ORDER — DEXMEDETOMIDINE HCL IN NACL 400 MCG/100ML IV SOLN
0.1000 ug/kg/h | INTRAVENOUS | Status: AC
  Administered 2020-03-25: .7 ug/kg/h via INTRAVENOUS
  Filled 2020-03-24: qty 100

## 2020-03-24 MED ORDER — UMECLIDINIUM BROMIDE 62.5 MCG/INH IN AEPB
1.0000 | INHALATION_SPRAY | Freq: Every day | RESPIRATORY_TRACT | Status: DC
  Administered 2020-03-24 – 2020-03-27 (×3): 1 via RESPIRATORY_TRACT
  Filled 2020-03-24: qty 7

## 2020-03-24 MED ORDER — VANCOMYCIN HCL 1250 MG/250ML IV SOLN
1250.0000 mg | INTRAVENOUS | Status: AC
  Administered 2020-03-25: 1250 mg via INTRAVENOUS
  Filled 2020-03-24: qty 250

## 2020-03-24 MED ORDER — SODIUM CHLORIDE 0.9 % IV SOLN
INTRAVENOUS | Status: DC
  Filled 2020-03-24: qty 30

## 2020-03-24 MED ORDER — CHLORHEXIDINE GLUCONATE 4 % EX LIQD
1.0000 "application " | Freq: Once | CUTANEOUS | Status: AC
  Administered 2020-03-24: 1 via TOPICAL
  Filled 2020-03-24: qty 15

## 2020-03-24 NOTE — Progress Notes (Signed)
Occupational Therapy Treatment Patient Details Name: Robert Dixon MRN: [id number] DOB: Nov 28, 1940 Today's Date: 03/24/2020    History of present illness 79 y/o male with advanced non-small cell lung cancer and aortic stenosis admitted 03/16/20 for acute on chronic repiratory failure with hypoxemia in the setting of a recurrent left sided pleural effusion. On Home oxygen 3 L. Pt to have TAVR 03/24/20.   OT comments  Pt ambulating with RW in room. Pt standing at sink x4 mins with rest break to conclude session at EOB. Pt seated for thorough energy conservation techniques. Pt able to recall 3 strategies to utilize at home. Pt on 3L O2 throughout  >90% O2. Pt would greatly benefit from continued OT skilled services. OT following acutely.     Follow Up Recommendations  Home health OT;Supervision - Intermittent    Equipment Recommendations  None recommended by OT    Recommendations for Other Services      Precautions / Restrictions Precautions Precautions: Fall Precaution Comments: monitor sats, HR Restrictions Weight Bearing Restrictions: Yes RUE Weight Bearing: Non weight bearing       Mobility Bed Mobility Overal bed mobility: Modified Independent Bed Mobility: Supine to Sit     Supine to sit: Modified independent (Device/Increase time);HOB elevated        Transfers Overall transfer level: Needs assistance Equipment used: Rolling walker (2 wheeled) Transfers: Sit to/from Stand Sit to Stand: Supervision         General transfer comment: assist for 1 line    Balance Overall balance assessment: Needs assistance Sitting-balance support: No upper extremity supported;Feet supported Sitting balance-Leahy Scale: Good     Standing balance support: No upper extremity supported;During functional activity Standing balance-Leahy Scale: Fair                             ADL either performed or assessed with clinical judgement   ADL Overall ADL's : Needs  assistance/impaired     Grooming: Supervision/safety;Standing Grooming Details (indicate cue type and reason): standing x4 mins before requiring rest break                             Functional mobility during ADLs: Supervision/safety;Rolling walker General ADL Comments: Pt ambulating with RW in room. Pt standing at sink x4 mins with rest break to conclude session at EOB. Pt seated for thorough energy conservation techniques. Pt able to recall 3 strategies to utilize at home.     Vision       Perception     Praxis      Cognition Arousal/Alertness: Awake/alert Behavior During Therapy: WFL for tasks assessed/performed Overall Cognitive Status: Within Functional Limits for tasks assessed                                          Exercises     Shoulder Instructions       General Comments O2 used throughout    Pertinent Vitals/ Pain       Pain Assessment: No/denies pain  Home Living                                          Prior Functioning/Environment  Frequency  Min 2X/week        Progress Toward Goals  OT Goals(current goals can now be found in the care plan section)  Progress towards OT goals: Progressing toward goals  Acute Rehab OT Goals Patient Stated Goal: to be able to go home, OT Goal Formulation: With patient Time For Goal Achievement: 03/31/20 Potential to Achieve Goals: Good ADL Goals Pt Will Perform Grooming: with modified independence;standing;sitting Pt Will Perform Lower Body Dressing: with modified independence;sit to/from stand;sitting/lateral leans Pt Will Transfer to Toilet: with modified independence;ambulating;grab bars Pt Will Perform Toileting - Clothing Manipulation and hygiene: with modified independence;sitting/lateral leans;sit to/from stand Additional ADL Goal #1: Patient will identify 3 energy conservation strategies to implement at home in order to maximize safety  and independence with self care.  Plan Discharge plan remains appropriate    Co-evaluation                 AM-PAC OT "6 Clicks" Daily Activity     Outcome Measure   Help from another person eating meals?: None Help from another person taking care of personal grooming?: A Little Help from another person toileting, which includes using toliet, bedpan, or urinal?: A Little Help from another person bathing (including washing, rinsing, drying)?: A Little Help from another person to put on and taking off regular upper body clothing?: None Help from another person to put on and taking off regular lower body clothing?: None 6 Click Score: 21    End of Session Equipment Utilized During Treatment: Gait belt;Rolling walker;Oxygen  OT Visit Diagnosis: Unsteadiness on feet (R26.81);Muscle weakness (generalized) (M62.81)   Activity Tolerance Patient tolerated treatment well   Patient Left in bed;with call bell/phone within reach   Nurse Communication Mobility status        Time: 1024-1050 OT Time Calculation (min): 26 min  Charges: OT General Charges $OT Visit: 1 Visit OT Treatments $Self Care/Home Management : 8-22 mins $Therapeutic Activity: 8-22 mins  Jefferey Pica, OTR/L Acute Rehabilitation Services Pager: [phone] Office: [phone]    Hadassah Rana C 03/24/2020, 5:25 PM

## 2020-03-24 NOTE — Progress Notes (Signed)
Progress Note  Patient Name: Robert Dixon Date of Encounter: 03/24/2020  Primary Cardiologist:   Minus Breeding, MD   Subjective   No pain.  Breathing is at baseline on 2.5 liters.   Inpatient Medications    Scheduled Meds: . arformoterol  15 mcg Nebulization BID  . aspirin  81 mg Oral Daily  . budesonide (PULMICORT) nebulizer solution  0.5 mg Nebulization BID  . chlorhexidine  15 mL Mouth Rinse BID  . Chlorhexidine Gluconate Cloth  6 each Topical Daily  . docusate sodium  100 mg Oral BID  . furosemide  40 mg Oral Daily  . heparin injection (subcutaneous)  5,000 Units Subcutaneous Q8H  . mouth rinse  15 mL Mouth Rinse q12n4p  . pantoprazole  40 mg Oral BID  . polycarbophil  625 mg Oral BID  . sodium chloride flush  10-40 mL Intracatheter Q12H  . sodium chloride flush  3 mL Intravenous Q12H  . sodium chloride flush  3 mL Intravenous Q12H  . tamsulosin  0.4 mg Oral Daily   Continuous Infusions: . sodium chloride     PRN Meds: sodium chloride, acetaminophen **OR** acetaminophen, albuterol, alum & mag hydroxide-simeth, dimenhyDRINATE, guaiFENesin-dextromethorphan, lip balm, ondansetron **OR** ondansetron (ZOFRAN) IV, polyethylene glycol, sodium chloride flush, sodium chloride flush, traMADol   Vital Signs    Vitals:   03/24/20 0321 03/24/20 0332 03/24/20 0727 03/24/20 0756  BP: (!) 95/55   (!) 90/52  Pulse: 87 86 88 85  Resp: 20  17 19   Temp:    97.7 F (36.5 C)  TempSrc:    Oral  SpO2: 95% 97% 98% 96%  Weight:  69.3 kg    Height:        Intake/Output Summary (Last 24 hours) at 03/24/2020 0801 Last data filed at 03/24/2020 0600 Gross per 24 hour  Intake 850 ml  Output 1075 ml  Net -225 ml   Filed Weights   03/22/20 0432 03/23/20 0614 03/24/20 0332  Weight: 68.8 kg 68.6 kg 69.3 kg    Telemetry    NSR - Personally Reviewed  Physical Exam   GEN: No  acute distress.   Neck: No  JVD Cardiac: RRR, no murmurs, rubs, or gallops.  Respiratory: Clear    to auscultation bilaterally. GI: Soft, nontender, non-distended, normal bowel sounds  MS:  No edema; No deformity. Neuro:   Nonfocal  Psych: Oriented and appropriate    Labs    Chemistry Recent Labs  Lab 03/22/20 0445 03/23/20 0309 03/24/20 0500  NA 142 135 141  K 3.7 3.1* 4.1  CL 100 95* 99  CO2 34* 31 34*  GLUCOSE 130* 179* 90  BUN 41* 35* 35*  CREATININE 1.24 1.31* 1.12  CALCIUM 8.3* 8.3* 8.1*  GFRNONAA 55* 52* >60  GFRAA >60 >60 >60  ANIONGAP 8 9 8      Hematology Recent Labs  Lab 03/22/20 0445 03/23/20 0309 03/24/20 0500  WBC 10.4 8.0 6.9  RBC 2.76* 2.98* 2.75*  HGB 10.7* 11.6* 11.0*  HCT 33.6* 35.6* 33.5*  MCV 121.7* 119.5* 121.8*  MCH 38.8* 38.9* 40.0*  MCHC 31.8 32.6 32.8  RDW 14.7 14.7 14.6  PLT 213 207 193    Cardiac EnzymesNo results for input(s): TROPONINI in the last 168 hours. No results for input(s): TROPIPOC in the last 168 hours.   BNP No results for input(s): BNP, PROBNP in the last 168 hours.   DDimer No results for input(s): DDIMER in the last 168 hours.  Radiology    CT CORONARY MORPH W/CTA COR W/SCORE W/CA W/CM &/OR WO/CM  Addendum Date: 03/22/2020   ADDENDUM REPORT: 03/22/2020 19:51 CLINICAL DATA:  79 year old male with severe aortic stenosis being evaluated for a TAVR procedure. EXAM: Cardiac TAVR CT TECHNIQUE: The patient was scanned on a [employer]. A 120 kV retrospective scan was triggered in the descending thoracic aorta at 111 HU's. Gantry rotation speed was 250 msecs and collimation was .6 mm. 10 mg of IV Metoprolol were given. The 3D data set was reconstructed in 5% intervals of the R-R cycle. Systolic and diastolic phases were analyzed on a dedicated work station using MPR, MIP and VRT modes. The patient received 80 cc of contrast. FINDINGS: Aortic Valve: Trileaflet aortic valve with severely thickened and calcified leaflets and no calcifications extending into the LVOT. Aorta: Normal size, no dissection, mild diffuse  atherosclerotic plaque and calcifications. Sinotubular Junction: 32 x 31 mm Ascending Thoracic Aorta: 34 x 34 mm Aortic Arch: 26 x 26 mm Descending Thoracic Aorta: 25 x 25 mm Sinus of Valsalva Measurements: Non-coronary: 33 mm Right -coronary: 33 mm Left -coronary: 34 mm Coronary Artery Height above Annulus: Left Main: 14 mm Right Coronary: 18 mm Virtual Basal Annulus Measurements: Maximum/Minimum Diameter: 27.4 x 23.3 mm Mean Diameter: 24.6 mm Perimeter: 79.1 mm Area: 475 mm2 Optimum Fluoroscopic Angle for Delivery: LAO 12 CAU 13 IMPRESSION: 1. Trileaflet aortic valve with severely thickened and calcified leaflets and no calcifications extending into the LVOT. Aortic valve calcium score is 3216 consistent with severe aortic stenosis. Annular measurements suitable for delivery of a 26 mm Edwards-SAPIEN 3 Ultra valve. 2. Sufficient coronary to annulus distance. 3. Optimum Fluoroscopic Angle for Delivery: LAO 12 CAU 13. 4. No thrombus in the left atrial appendage. Electronically Signed   By: Ena Dawley   On: 03/22/2020 19:51   Result Date: 03/22/2020 EXAM: OVER-READ INTERPRETATION  CT CHEST The following report is an over-read performed by radiologist Dr. Salvatore Marvel of [facility], [place] on 03/22/2020. This over-read does not include interpretation of cardiac or coronary anatomy or pathology. The coronary CTA interpretation by the cardiologist is attached. COMPARISON:  03/15/2020 chest CT. FINDINGS: Please see the separate concurrent chest CT angiogram report for details. IMPRESSION: Please see the separate concurrent chest CT angiogram report for details. Electronically Signed: By: Ilona Sorrel M.D. On: 03/22/2020 11:40   CT ANGIO CHEST AORTA W/CM & OR WO/CM  Result Date: 03/22/2020 CLINICAL DATA:  Inpatient. Pre-TAVR evaluation. Severe aortic stenosis with chronic systolic heart failure. 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 EXAM: CT ANGIOGRAPHY CHEST, ABDOMEN  AND PELVIS TECHNIQUE: Non-contrast CT of the chest was initially obtained. Multidetector CT imaging through the chest, abdomen and pelvis was performed using the standard protocol during bolus administration of intravenous contrast. Multiplanar reconstructed images and MIPs were obtained and reviewed to evaluate the vascular anatomy. CONTRAST:  147mL OMNIPAQUE IOHEXOL 350 MG/ML SOLN COMPARISON:  03/15/2020 chest CT. 12/11/2018 PET-CT. FINDINGS: CTA CHEST FINDINGS Cardiovascular: Mild cardiomegaly. Diffuse thickening and coarse calcification of the aortic valve. Small pericardial effusion, similar. Right internal jugular Port-A-Cath terminates in the lower third of the SVC. Left anterior descending and right coronary atherosclerosis. Atherosclerotic nonaneurysmal thoracic aorta. Normal caliber pulmonary arteries. No central pulmonary emboli. Mediastinum/Nodes: No discrete thyroid nodules. Unremarkable esophagus. No pathologically enlarged axillary, mediastinal or hilar lymph nodes. Lungs/Pleura: No pneumothorax. Small to moderate dependent bilateral pleural effusions, unchanged. Moderate to severe centrilobular emphysema with diffuse bronchial wall thickening.  Sharply marginated lower left perihilar consolidation with associated mild bronchiectasis, volume loss and distortion, unchanged, compatible with postradiation change. Moderate passive atelectasis in the dependent lower lobes bilaterally. No acute interval consolidation. No significant pulmonary nodules. Musculoskeletal: No aggressive appearing focal osseous lesions. Chronic severe T7 vertebral compression fracture. Mild thoracic spondylosis. CTA ABDOMEN AND PELVIS FINDINGS Hepatobiliary: Normal liver size. A few scattered subcentimeter hypodense liver lesions are too small to characterize and are not appreciably changed since 12/11/2018 PET-CT, considered benign. No new liver lesions. Normal gallbladder with no radiopaque cholelithiasis. No biliary ductal  dilatation. Pancreas: Normal, with no mass or duct dilation. Spleen: Normal size. No mass. Adrenals/Urinary Tract: Normal adrenals. No hydronephrosis. Exophytic hyperdense 2.8 cm renal cortical lesion in the medial upper left kidney (series 9/image 105) and exophytic isodense 2.2 cm renal cortical lesion in the far upper left kidney (series 9/image 97), neither appreciably changed size since 12/11/2018 PET-CT. Numerous simple renal cortical cysts in both kidneys, largest an exophytic simple 6.3 cm renal cortical cyst in the posterior lower left kidney. Numerous subcentimeter hypodense renal cortical lesions in both kidneys are too small to characterize. Normal bladder. Stomach/Bowel: Normal non-distended stomach. Normal caliber small bowel with no small bowel wall thickening. Appendix not discretely visualized. Marked sigmoid diverticulosis with no large bowel wall thickening or significant pericolonic fat stranding. Vascular/Lymphatic: Atherosclerotic abdominal aorta with ectatic 2.5 cm infrarenal abdominal aorta. No pathologically enlarged lymph nodes in the abdomen or pelvis. Reproductive: Mildly enlarged prostate. Other: No pneumoperitoneum, ascites or focal fluid collection. Musculoskeletal: No aggressive appearing focal osseous lesions. Mild lumbar spondylosis. VASCULAR MEASUREMENTS PERTINENT TO TAVR: AORTA: Minimal Aortic Diameter-13.2 x 10.0 mm Severity of Aortic Calcification-moderate to severe RIGHT PELVIS: Right Common Iliac Artery - Minimal Diameter-8.0 x 4.1 mm Tortuosity-moderate Calcification-severe Right External Iliac Artery - Minimal Diameter-6.5 x 4.7 mm Tortuosity-mild Calcification-moderate Right Common Femoral Artery - Minimal Diameter-5.9 x 3.5 mm Tortuosity-mild Calcification-severe LEFT PELVIS: Left Common Iliac Artery - Minimal Diameter-6.8 x 5.7 mm Tortuosity-mild Calcification-severe Left External Iliac Artery - Minimal Diameter-5.3 x 4.2 mm Tortuosity-mild-to-moderate  Calcification-severe Left Common Femoral Artery - Minimal Diameter-6.2 x 4.7 mm Tortuosity-mild Calcification-severe Review of the MIP images confirms the above findings. IMPRESSION: 1. Vascular findings and measurements pertinent to potential TAVR procedure, as detailed. 2. Diffuse thickening and coarse calcification of the aortic valve, compatible with the reported history of severe aortic stenosis. 3. Stable postradiation change in the lower left perihilar lung. No evidence of local tumor recurrence. No findings of metastatic disease. 4. Stable small to moderate dependent bilateral pleural effusions. 5. Mild cardiomegaly. Small pericardial effusion, similar. Two-vessel coronary atherosclerosis. 6. Marked sigmoid diverticulosis. 7. Mildly enlarged prostate. 8. Aortic Atherosclerosis (ICD10-I70.0) and Emphysema (ICD10-J43.9). Electronically Signed   By: Ilona Sorrel M.D.   On: 03/22/2020 14:03   VAS US CAROTID  Result Date: 03/22/2020 Carotid Arterial Duplex Study Indications:       Pre tavr. Risk Factors:      Hypertension. Comparison Study:  no prior Performing Technologist: Abram Sander RVS  Examination Guidelines: A complete evaluation includes B-mode imaging, spectral Doppler, color Doppler, and power Doppler as needed of all accessible portions of each vessel. Bilateral testing is considered an integral part of a complete examination. Limited examinations for reoccurring indications may be performed as noted.  Right Carotid Findings: +----------+--------+--------+--------+------------------+--------+           PSV cm/sEDV cm/sStenosisPlaque DescriptionComments +----------+--------+--------+--------+------------------+--------+ CCA Prox  26                                                 +----------+--------+--------+--------+------------------+--------+  CCA Distal21      4                                          +----------+--------+--------+--------+------------------+--------+ ICA  Prox                  Occluded                           +----------+--------+--------+--------+------------------+--------+ ICA Mid                   Occluded                           +----------+--------+--------+--------+------------------+--------+ ICA Distal                Occluded                           +----------+--------+--------+--------+------------------+--------+ ECA       131                                                +----------+--------+--------+--------+------------------+--------+ +----------+--------+-------+--------+-------------------+           PSV cm/sEDV cmsDescribeArm Pressure (mmHG) +----------+--------+-------+--------+-------------------+ [id number]                                         +----------+--------+-------+--------+-------------------+ +---------+--------+--+--------+-+---------+ VertebralPSV cm/s28EDV cm/s8Antegrade +---------+--------+--+--------+-+---------+  Left Carotid Findings: +----------+--------+--------+--------+------------------+--------+           PSV cm/sEDV cm/sStenosisPlaque DescriptionComments +----------+--------+--------+--------+------------------+--------+ CCA Prox  67      14              heterogenous               +----------+--------+--------+--------+------------------+--------+ CCA Distal67      18              heterogenous               +----------+--------+--------+--------+------------------+--------+ ICA Prox  93      33      1-39%   heterogenous               +----------+--------+--------+--------+------------------+--------+ ICA Distal61      20                                         +----------+--------+--------+--------+------------------+--------+ ECA       95                                                 +----------+--------+--------+--------+------------------+--------+ +----------+--------+--------+--------+-------------------+            PSV cm/sEDV cm/sDescribeArm Pressure (mmHG) +----------+--------+--------+--------+-------------------+ [id number]                                          +----------+--------+--------+--------+-------------------+ +---------+--------+--+--------+-+---------+  VertebralPSV cm/s31EDV cm/s7Antegrade +---------+--------+--+--------+-+---------+   Summary: Right Carotid: Evidence consistent with a total occlusion of the right ICA. Left Carotid: Velocities in the left ICA are consistent with a 1-39% stenosis. Vertebrals: Bilateral vertebral arteries demonstrate antegrade flow. *See table(s) above for measurements and observations.  Electronically signed by Monica Martinez MD on 03/22/2020 at 4:46:21 PM.    Final    CT Angio Abd/Pel w/ and/or w/o  Result Date: 03/22/2020 CLINICAL DATA:  Inpatient. Pre-TAVR evaluation. Severe aortic stenosis with chronic systolic heart failure. 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 EXAM: CT ANGIOGRAPHY CHEST, ABDOMEN AND PELVIS TECHNIQUE: Non-contrast CT of the chest was initially obtained. Multidetector CT imaging through the chest, abdomen and pelvis was performed using the standard protocol during bolus administration of intravenous contrast. Multiplanar reconstructed images and MIPs were obtained and reviewed to evaluate the vascular anatomy. CONTRAST:  186mL OMNIPAQUE IOHEXOL 350 MG/ML SOLN COMPARISON:  03/15/2020 chest CT. 12/11/2018 PET-CT. FINDINGS: CTA CHEST FINDINGS Cardiovascular: Mild cardiomegaly. Diffuse thickening and coarse calcification of the aortic valve. Small pericardial effusion, similar. Right internal jugular Port-A-Cath terminates in the lower third of the SVC. Left anterior descending and right coronary atherosclerosis. Atherosclerotic nonaneurysmal thoracic aorta. Normal caliber pulmonary arteries. No central pulmonary emboli. Mediastinum/Nodes: No discrete thyroid nodules. Unremarkable esophagus. No  pathologically enlarged axillary, mediastinal or hilar lymph nodes. Lungs/Pleura: No pneumothorax. Small to moderate dependent bilateral pleural effusions, unchanged. Moderate to severe centrilobular emphysema with diffuse bronchial wall thickening. Sharply marginated lower left perihilar consolidation with associated mild bronchiectasis, volume loss and distortion, unchanged, compatible with postradiation change. Moderate passive atelectasis in the dependent lower lobes bilaterally. No acute interval consolidation. No significant pulmonary nodules. Musculoskeletal: No aggressive appearing focal osseous lesions. Chronic severe T7 vertebral compression fracture. Mild thoracic spondylosis. CTA ABDOMEN AND PELVIS FINDINGS Hepatobiliary: Normal liver size. A few scattered subcentimeter hypodense liver lesions are too small to characterize and are not appreciably changed since 12/11/2018 PET-CT, considered benign. No new liver lesions. Normal gallbladder with no radiopaque cholelithiasis. No biliary ductal dilatation. Pancreas: Normal, with no mass or duct dilation. Spleen: Normal size. No mass. Adrenals/Urinary Tract: Normal adrenals. No hydronephrosis. Exophytic hyperdense 2.8 cm renal cortical lesion in the medial upper left kidney (series 9/image 105) and exophytic isodense 2.2 cm renal cortical lesion in the far upper left kidney (series 9/image 97), neither appreciably changed size since 12/11/2018 PET-CT. Numerous simple renal cortical cysts in both kidneys, largest an exophytic simple 6.3 cm renal cortical cyst in the posterior lower left kidney. Numerous subcentimeter hypodense renal cortical lesions in both kidneys are too small to characterize. Normal bladder. Stomach/Bowel: Normal non-distended stomach. Normal caliber small bowel with no small bowel wall thickening. Appendix not discretely visualized. Marked sigmoid diverticulosis with no large bowel wall thickening or significant pericolonic fat stranding.  Vascular/Lymphatic: Atherosclerotic abdominal aorta with ectatic 2.5 cm infrarenal abdominal aorta. No pathologically enlarged lymph nodes in the abdomen or pelvis. Reproductive: Mildly enlarged prostate. Other: No pneumoperitoneum, ascites or focal fluid collection. Musculoskeletal: No aggressive appearing focal osseous lesions. Mild lumbar spondylosis. VASCULAR MEASUREMENTS PERTINENT TO TAVR: AORTA: Minimal Aortic Diameter-13.2 x 10.0 mm Severity of Aortic Calcification-moderate to severe RIGHT PELVIS: Right Common Iliac Artery - Minimal Diameter-8.0 x 4.1 mm Tortuosity-moderate Calcification-severe Right External Iliac Artery - Minimal Diameter-6.5 x 4.7 mm Tortuosity-mild Calcification-moderate Right Common Femoral Artery - Minimal Diameter-5.9 x 3.5 mm Tortuosity-mild Calcification-severe LEFT PELVIS: Left Common Iliac Artery - Minimal Diameter-6.8 x 5.7 mm Tortuosity-mild Calcification-severe Left External Iliac Artery -  Minimal Diameter-5.3 x 4.2 mm Tortuosity-mild-to-moderate Calcification-severe Left Common Femoral Artery - Minimal Diameter-6.2 x 4.7 mm Tortuosity-mild Calcification-severe Review of the MIP images confirms the above findings. IMPRESSION: 1. Vascular findings and measurements pertinent to potential TAVR procedure, as detailed. 2. Diffuse thickening and coarse calcification of the aortic valve, compatible with the reported history of severe aortic stenosis. 3. Stable postradiation change in the lower left perihilar lung. No evidence of local tumor recurrence. No findings of metastatic disease. 4. Stable small to moderate dependent bilateral pleural effusions. 5. Mild cardiomegaly. Small pericardial effusion, similar. Two-vessel coronary atherosclerosis. 6. Marked sigmoid diverticulosis. 7. Mildly enlarged prostate. 8. Aortic Atherosclerosis (ICD10-I70.0) and Emphysema (ICD10-J43.9). Electronically Signed   By: Ilona Sorrel M.D.   On: 03/22/2020 14:03    Cardiac Studies     Echo: IMPRESSIONS   1. Hypokinesis of the distal anteroseptal wall and apex; overall moderate  to severe LV dysfunction; calcified aortic valve with probable severe AS  (mean gradient 31 mmHg; AVA 1 cm2) and mild AI; mild MR; mild LAE; small  pericardial effusion with RA  inversion.  2. Left ventricular ejection fraction, by estimation, is 30 to 35%. The  left ventricle has moderate to severely decreased function. The left  ventricle has no regional wall motion abnormalities. The left ventricular  internal cavity size was mildly  dilated. There is mild left ventricular hypertrophy. Left ventricular  diastolic parameters are indeterminate. Elevated left atrial pressure.  3. Right ventricular systolic function is normal. The right ventricular  size is normal. There is mildly elevated pulmonary artery systolic  pressure.  4. Left atrial size was mildly dilated.  5. The mitral valve is normal in structure. Mild mitral valve  regurgitation. No evidence of mitral stenosis.  6. The aortic valve has an indeterminant number of cusps. Aortic valve  regurgitation is mild. No aortic stenosis is present.    Cath 1.  Calcific, nonobstructive coronary artery disease as outlined 2.  Preserved cardiac output of 7.56 L/min and cardiac index 4.16 3.  Known severe calcific aortic stenosis (stage D2 low-flow low gradient) with inability to cross the aortic valve with a wire 4.  Intracardiac hemodynamics suggestive of congestive heart failure with pulmonary capillary wedge pressure 31 mmHg, PA pressure 52/29 with a mean of 40  Patient Profile     79 y.o. male  from [place] with severe aortic stenosis, chronic systolic heart failure, lung cancer with favorable prognosis  Assessment & Plan    AS:   Plan for TAVR on Thursday.   ACUTE ON CHRONIC SYSTOLIC HF:     EF is reduced compared to previous.  Net negative 8.3 liters since admission.   Even over the last 24 hours and Lasix was changed  to PO.  BP is still low but stable.  Continue current therapy.   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  No change in therapy.    For questions or updates, please contact [place] Please consult [email or web address] for contact info under Cardiology/STEMI.   Signed, Minus Breeding, MD  03/24/2020, 8:01 AM

## 2020-03-25 ENCOUNTER — Inpatient Hospital Stay (HOSPITAL_COMMUNITY): Payer: Medicare HMO

## 2020-03-25 ENCOUNTER — Encounter (HOSPITAL_COMMUNITY)
Admission: EM | Disposition: A | Discharge status: Home Health | Payer: Self-pay | Source: Home / Self Care | Attending: Cardiology

## 2020-03-25 ENCOUNTER — Other Ambulatory Visit (HOSPITAL_COMMUNITY): Payer: Medicare HMO

## 2020-03-25 ENCOUNTER — Inpatient Hospital Stay (HOSPITAL_COMMUNITY): Payer: Medicare HMO | Admitting: Certified Registered Nurse Anesthetist

## 2020-03-25 DIAGNOSIS — I350 Nonrheumatic aortic (valve) stenosis: Secondary | ICD-10-CM

## 2020-03-25 DIAGNOSIS — Z952 Presence of prosthetic heart valve: Secondary | ICD-10-CM

## 2020-03-25 DIAGNOSIS — J9621 Acute and chronic respiratory failure with hypoxia: Secondary | ICD-10-CM | POA: Diagnosis not present

## 2020-03-25 DIAGNOSIS — Z006 Encounter for examination for normal comparison and control in clinical research program: Secondary | ICD-10-CM

## 2020-03-25 HISTORY — PX: TEE WITHOUT CARDIOVERSION: SHX5443

## 2020-03-25 HISTORY — DX: Presence of prosthetic heart valve: Z95.2

## 2020-03-25 HISTORY — PX: TRANSCATHETER AORTIC VALVE REPLACEMENT, TRANSFEMORAL: SHX6400

## 2020-03-25 LAB — BASIC METABOLIC PANEL
Anion gap: 8 (ref 5–15)
BUN: 34 mg/dL — ABNORMAL HIGH (ref 8–23)
CO2: 31 mmol/L (ref 22–32)
Calcium: 7.9 mg/dL — ABNORMAL LOW (ref 8.9–10.3)
Chloride: 99 mmol/L (ref 98–111)
Creatinine, Ser: 1.17 mg/dL (ref 0.61–1.24)
GFR calc Af Amer: >60 mL/min (ref >60)
GFR calc non Af Amer: 59 mL/min — ABNORMAL LOW (ref >60)
Glucose, Bld: 141 mg/dL — ABNORMAL HIGH (ref 70–99)
Potassium: 4.1 mmol/L (ref 3.5–5.1)
Sodium: 138 mmol/L (ref 135–145)

## 2020-03-25 LAB — POCT I-STAT, CHEM 8
BUN: 27 mg/dL — ABNORMAL HIGH (ref 8–23)
BUN: 27 mg/dL — ABNORMAL HIGH (ref 8–23)
BUN: 28 mg/dL — ABNORMAL HIGH (ref 8–23)
Calcium, Ion: 1.1 mmol/L — ABNORMAL LOW (ref 1.15–1.40)
Calcium, Ion: 1.17 mmol/L (ref 1.15–1.40)
Calcium, Ion: 1.17 mmol/L (ref 1.15–1.40)
Chloride: 96 mmol/L — ABNORMAL LOW (ref 98–111)
Chloride: 97 mmol/L — ABNORMAL LOW (ref 98–111)
Chloride: 95 mmol/L — ABNORMAL LOW (ref 98–111)
Creatinine, Ser: 1 mg/dL (ref 0.61–1.24)
Creatinine, Ser: 1 mg/dL (ref 0.61–1.24)
Creatinine, Ser: 1 mg/dL (ref 0.61–1.24)
Glucose, Bld: 109 mg/dL — ABNORMAL HIGH (ref 70–99)
Glucose, Bld: 125 mg/dL — ABNORMAL HIGH (ref 70–99)
Glucose, Bld: 120 mg/dL — ABNORMAL HIGH (ref 70–99)
HCT: 28 % — ABNORMAL LOW (ref 39.0–52.0)
HCT: 34 % — ABNORMAL LOW (ref 39.0–52.0)
HCT: 28 % — ABNORMAL LOW (ref 39.0–52.0)
Hemoglobin: 11.6 g/dL — ABNORMAL LOW (ref 13.0–17.0)
Hemoglobin: 9.5 g/dL — ABNORMAL LOW (ref 13.0–17.0)
Hemoglobin: 9.5 g/dL — ABNORMAL LOW (ref 13.0–17.0)
Potassium: 4.1 mmol/L (ref 3.5–5.1)
Potassium: 4.5 mmol/L (ref 3.5–5.1)
Potassium: 4.3 mmol/L (ref 3.5–5.1)
Sodium: 137 mmol/L (ref 135–145)
Sodium: 137 mmol/L (ref 135–145)
Sodium: 136 mmol/L (ref 135–145)
TCO2: 29 mmol/L (ref 22–32)
TCO2: 32 mmol/L (ref 22–32)
TCO2: 31 mmol/L (ref 22–32)

## 2020-03-25 LAB — CBC
HCT: 31 % — ABNORMAL LOW (ref 39.0–52.0)
Hemoglobin: 10.3 g/dL — ABNORMAL LOW (ref 13.0–17.0)
MCH: 40.2 pg — ABNORMAL HIGH (ref 26.0–34.0)
MCHC: 33.2 g/dL (ref 30.0–36.0)
MCV: 121.1 fL — ABNORMAL HIGH (ref 80.0–100.0)
Platelets: 161 10*3/uL (ref 150–400)
RBC: 2.56 MIL/uL — ABNORMAL LOW (ref 4.22–5.81)
RDW: 14.5 % (ref 11.5–15.5)
WBC: 6.2 10*3/uL (ref 4.0–10.5)
nRBC: 0 % (ref 0.0–0.2)

## 2020-03-25 LAB — POCT I-STAT 7, (LYTES, BLD GAS, ICA,H+H)
Acid-Base Excess: 8 mmol/L — ABNORMAL HIGH (ref 0.0–2.0)
Bicarbonate: 32.3 mmol/L — ABNORMAL HIGH (ref 20.0–28.0)
Calcium, Ion: 1.18 mmol/L (ref 1.15–1.40)
HCT: 32 % — ABNORMAL LOW (ref 39.0–52.0)
Hemoglobin: 10.9 g/dL — ABNORMAL LOW (ref 13.0–17.0)
O2 Saturation: 100 %
Potassium: 4.4 mmol/L (ref 3.5–5.1)
Sodium: 136 mmol/L (ref 135–145)
TCO2: 34 mmol/L — ABNORMAL HIGH (ref 22–32)
pCO2 arterial: 44.1 mmHg (ref 32.0–48.0)
pH, Arterial: 7.474 — ABNORMAL HIGH (ref 7.350–7.450)
pO2, Arterial: 241 mmHg — ABNORMAL HIGH (ref 83.0–108.0)

## 2020-03-25 LAB — ECHOCARDIOGRAM LIMITED
Height: 68 in
Weight: 2486.79 oz

## 2020-03-25 LAB — POCT ACTIVATED CLOTTING TIME
Activated Clotting Time: 125 seconds
Activated Clotting Time: 252 seconds
Activated Clotting Time: 104 seconds

## 2020-03-25 LAB — TYPE AND SCREEN
ABO/RH(D): B NEG
Antibody Screen: NEGATIVE

## 2020-03-25 LAB — PROTIME-INR
INR: 1 (ref 0.8–1.2)
Prothrombin Time: 12.8 seconds (ref 11.4–15.2)

## 2020-03-25 SURGERY — IMPLANTATION, AORTIC VALVE, TRANSCATHETER, FEMORAL APPROACH
Anesthesia: Monitor Anesthesia Care

## 2020-03-25 MED ORDER — SODIUM CHLORIDE 0.9% FLUSH: 3.0000 mL | INTRAVENOUS | Status: DC | PRN

## 2020-03-25 MED ORDER — ACETAMINOPHEN 325 MG PO TABS: 650.0000 mg | ORAL_TABLET | Freq: Four times a day (QID) | ORAL | Status: DC | PRN

## 2020-03-25 MED ORDER — IOHEXOL 350 MG/ML SOLN
INTRAVENOUS | Status: DC | PRN
  Administered 2020-03-25: 50 mL

## 2020-03-25 MED ORDER — FENTANYL CITRATE (PF) 100 MCG/2ML IJ SOLN
INTRAMUSCULAR | Status: DC | PRN
  Administered 2020-03-25 (×4): 25 ug via INTRAVENOUS

## 2020-03-25 MED ORDER — SODIUM CHLORIDE 0.9 % IV SOLN: INTRAVENOUS | Status: AC

## 2020-03-25 MED ORDER — PHENYLEPHRINE HCL-NACL 10-0.9 MG/250ML-% IV SOLN
INTRAVENOUS | Status: DC | PRN
  Administered 2020-03-25: 20 ug/min via INTRAVENOUS

## 2020-03-25 MED ORDER — PHENYLEPHRINE HCL-NACL 20-0.9 MG/250ML-% IV SOLN: 0.0000 ug/min | INTRAVENOUS | Status: DC

## 2020-03-25 MED ORDER — MORPHINE SULFATE (PF) 2 MG/ML IV SOLN: 1.0000 mg | INTRAVENOUS | Status: DC | PRN

## 2020-03-25 MED ORDER — SODIUM CHLORIDE 0.9 % IV SOLN
1.5000 g | Freq: Two times a day (BID) | INTRAVENOUS | Status: AC
  Administered 2020-03-25 – 2020-03-27 (×4): 1.5 g via INTRAVENOUS
  Filled 2020-03-25 (×4): qty 1.5

## 2020-03-25 MED ORDER — FENTANYL CITRATE (PF) 100 MCG/2ML IJ SOLN
INTRAMUSCULAR | Status: AC
  Filled 2020-03-25: qty 2

## 2020-03-25 MED ORDER — LIDOCAINE HCL (PF) 1 % IJ SOLN
INTRAMUSCULAR | Status: DC | PRN
  Administered 2020-03-25 (×2): 5 mL via INTRADERMAL

## 2020-03-25 MED ORDER — VANCOMYCIN HCL IN DEXTROSE 1-5 GM/200ML-% IV SOLN
1000.0000 mg | Freq: Once | INTRAVENOUS | Status: AC
  Administered 2020-03-25: 1000 mg via INTRAVENOUS
  Filled 2020-03-25: qty 200

## 2020-03-25 MED ORDER — OXYCODONE HCL 5 MG PO TABS
5.0000 mg | ORAL_TABLET | ORAL | Status: DC | PRN
  Administered 2020-03-26 – 2020-03-27 (×2): 5 mg via ORAL
  Filled 2020-03-25 (×2): qty 1

## 2020-03-25 MED ORDER — SODIUM CHLORIDE 0.9% FLUSH
3.0000 mL | Freq: Two times a day (BID) | INTRAVENOUS | Status: DC
  Administered 2020-03-25 – 2020-03-28 (×4): 3 mL via INTRAVENOUS

## 2020-03-25 MED ORDER — TRAMADOL HCL 50 MG PO TABS: 50.0000 mg | ORAL_TABLET | ORAL | Status: DC | PRN

## 2020-03-25 MED ORDER — SODIUM CHLORIDE 0.9 % IV SOLN: 250.0000 mL | INTRAVENOUS | Status: DC | PRN

## 2020-03-25 MED ORDER — PROPOFOL 500 MG/50ML IV EMUL
INTRAVENOUS | Status: DC | PRN
  Administered 2020-03-25: 10 ug/kg/min via INTRAVENOUS

## 2020-03-25 MED ORDER — HEPARIN SODIUM (PORCINE) 1000 UNIT/ML IJ SOLN
INTRAMUSCULAR | Status: DC | PRN
  Administered 2020-03-25: 11000 [IU] via INTRAVENOUS
  Administered 2020-03-25: 3000 [IU] via INTRAVENOUS

## 2020-03-25 MED ORDER — ONDANSETRON HCL 4 MG/2ML IJ SOLN: 4.0000 mg | Freq: Four times a day (QID) | INTRAMUSCULAR | Status: DC | PRN

## 2020-03-25 MED ORDER — HEPARIN (PORCINE) IN NACL 1000-0.9 UT/500ML-% IV SOLN
INTRAVENOUS | Status: DC | PRN
  Administered 2020-03-25 (×3): 500 mL

## 2020-03-25 MED ORDER — LACTATED RINGERS IV SOLN: INTRAVENOUS | Status: DC | PRN

## 2020-03-25 MED ORDER — CLOPIDOGREL BISULFATE 75 MG PO TABS
75.0000 mg | ORAL_TABLET | Freq: Every day | ORAL | Status: DC
  Administered 2020-03-26 – 2020-03-28 (×3): 75 mg via ORAL
  Filled 2020-03-25 (×3): qty 1

## 2020-03-25 MED ORDER — DEXMEDETOMIDINE HCL 200 MCG/2ML IV SOLN
INTRAVENOUS | Status: DC | PRN
  Administered 2020-03-25: 35.25 ug via INTRAVENOUS

## 2020-03-25 MED ORDER — PROTAMINE SULFATE 10 MG/ML IV SOLN
INTRAVENOUS | Status: DC | PRN
Start: 2020-03-25 — End: 2020-03-25
  Administered 2020-03-25: 110 mg via INTRAVENOUS

## 2020-03-25 MED ORDER — ACETAMINOPHEN 650 MG RE SUPP: 650.0000 mg | Freq: Four times a day (QID) | RECTAL | Status: DC | PRN

## 2020-03-25 MED ORDER — NITROGLYCERIN IN D5W 200-5 MCG/ML-% IV SOLN: 0.0000 ug/min | INTRAVENOUS | Status: DC

## 2020-03-25 MED ORDER — PHENYLEPHRINE HCL (PRESSORS) 10 MG/ML IV SOLN
INTRAVENOUS | Status: DC | PRN
  Administered 2020-03-25: 120 ug via INTRAVENOUS

## 2020-03-25 SURGICAL SUPPLY — 34 items
BAG SNAP BAND KOVER 36X36 (MISCELLANEOUS) ×6 IMPLANT
BLANKET WARM UNDERBOD FULL ACC (MISCELLANEOUS) ×3 IMPLANT
CABLE ADAPT PACING TEMP 12FT (ADAPTER) ×3 IMPLANT
CATH 26 ULTRA DELIVERY (CATHETERS) ×3 IMPLANT
CATH DIAG 6FR PIGTAIL ANGLED (CATHETERS) ×6 IMPLANT
CATH INFINITI 6F AL2 (CATHETERS) ×3 IMPLANT
CATH INFINITI JR4 5F (CATHETERS) ×3 IMPLANT
CATH S G BIP PACING (CATHETERS) ×3 IMPLANT
CRIMPER (MISCELLANEOUS) ×3 IMPLANT
DEVICE CLOSURE PERCLS PRGLD 6F (VASCULAR PRODUCTS) ×4 IMPLANT
DEVICE INFLATION ATRION QL2530 (MISCELLANEOUS) ×3 IMPLANT
GUIDEWIRE SAFE TJ AMPLATZ EXST (WIRE) ×3 IMPLANT
KIT HEART LEFT (KITS) ×3 IMPLANT
KIT MICROPUNCTURE NIT STIFF (SHEATH) ×3 IMPLANT
PACK CARDIAC CATHETERIZATION (CUSTOM PROCEDURE TRAY) ×3 IMPLANT
PERCLOSE PROGLIDE 6F (VASCULAR PRODUCTS) ×6
SHEATH 14X36 EDWARDS (SHEATH) ×3 IMPLANT
SHEATH BRITE TIP 7FR 35CM (SHEATH) ×3 IMPLANT
SHEATH PINNACLE 6F 10CM (SHEATH) ×3 IMPLANT
SHEATH PINNACLE 8F 10CM (SHEATH) ×3 IMPLANT
SHEATH PROBE COVER 6X72 (BAG) ×3 IMPLANT
SLEEVE REPOSITIONING LENGTH 30 (MISCELLANEOUS) ×3 IMPLANT
STOPCOCK MORSE 400PSI 3WAY (MISCELLANEOUS) ×6 IMPLANT
TRANSDUCER W/STOPCOCK (MISCELLANEOUS) ×6 IMPLANT
TUBE CONN 8.8X1320 FR HP M-F (CONNECTOR) ×3 IMPLANT
TUBING ART PRESS 72  MALE/FEM (TUBING) ×3
TUBING ART PRESS 72 MALE/FEM (TUBING) ×2 IMPLANT
TUBING CIL FLEX 10 FLL-RA (TUBING) ×3 IMPLANT
VALVE 26 ULTRA SAPIEN KIT (Valve) ×3 IMPLANT
WIRE AMPLATZ SS-J .035X180CM (WIRE) ×3 IMPLANT
WIRE EMERALD 3MM-J .035X150CM (WIRE) ×3 IMPLANT
WIRE EMERALD 3MM-J .035X260CM (WIRE) ×3 IMPLANT
WIRE EMERALD ST .035X260CM (WIRE) ×3 IMPLANT
WIRE HI TORQ VERSACORE-J 145CM (WIRE) ×3 IMPLANT

## 2020-03-25 NOTE — Anesthesia Procedure Notes (Signed)
Arterial Line Insertion Start/End4/29/2021 11:30 AM, 03/25/2020 11:45 AM Performed by: Shirlyn Goltz, CRNA  Patient location: Pre-op. Preanesthetic checklist: patient identified, IV checked, site marked, risks and benefits discussed, surgical consent, monitors and equipment checked, pre-op evaluation, timeout performed and anesthesia consent Lidocaine 1% used for infiltration Right, radial was placed Catheter size: 20 G Hand hygiene performed , maximum sterile barriers used  and Seldinger technique used Allen's test indicative of satisfactory collateral circulation Attempts: 2 Procedure performed without using ultrasound guided technique. Following insertion, dressing applied and Biopatch.

## 2020-03-25 NOTE — Progress Notes (Signed)
Pt arrived back to 4e from cath lab. Bilateral groin sites level 0. Palpable pedal pulses. Pt denies pain. BP 85/56 initially. BP rechecked and is now 90/61.

## 2020-03-25 NOTE — Telephone Encounter (Signed)
FMLA paperwork signed by Dr.Icard and placed in green folder.  Green folder placed on [employer].

## 2020-03-25 NOTE — Op Note (Signed)
[facility]   MULTIDISCIPLINARY HEART VALVE TEAM   TAVR OPERATIVE NOTE   Date of Procedure:  03/25/2020  Preoperative Diagnosis: Severe Aortic Stenosis   Postoperative Diagnosis: Same   Procedure:    Transcatheter Aortic Valve Replacement - Percutaneous Left Transfemoral Approach  Edwards Sapien 3 Ultra THV (size 26 mm, model # 9750TFX, serial # [id number])   Co-Surgeons:  Gaye Pollack, MD and [name], MD    Anesthesiologist:  Laurie Panda, MD  Echocardiographer:  Jenkins Rouge, MD  Pre-operative Echo Findings:  Severe aortic stenosis  Severe left ventricular systolic dysfunction  Post-operative Echo Findings:  No paravalvular leak  Unchanged Severe left ventricular systolic dysfunction   BRIEF CLINICAL NOTE AND INDICATIONS FOR SURGERY  He is a 79 year old gentleman with oxygen dependent COPD and stage IIIb non-small cell lung cancer treated with chemotherapy and XRT as well as ongoing immunotherapy.  He says that he had chronic stable shortness of breath until the past couple weeks when he developed progressive symptoms of orthopnea and shortness of breath at rest.  He was noted to have an elevated BNP and pulmonary vascular congestion with bilateral pleural effusions on chest x-ray.  He had a left thoracentesis done on 03/16/2020 removing 1.1 L of fluid.  Cytology was negative for malignancy.  He has known aortic stenosis and a follow-up echocardiogram now showed a reduction in his ejection fraction to 30 to 35% with a mean gradient 31 mmHg and a peak gradient of 44 mmHg across aortic valve.  The valve is calcified with restricted mobility and visually he has severe aortic stenosis.  Cardiac catheterization shows nonobstructive coronary disease with an elevated pulmonary capillary wedge pressure of 31 mmHg and moderate pulmonary hypertension at 52/29.  I think his shortness of breath is multifactorial due to severe COPD, moderate to large bilateral  pleural effusions, and now severe aortic stenosis.  I agree that aortic valve replacement is indicated in this patient to improve his symptoms and prevent progressive left ventricular deterioration.  Given his history and age I think TAVR would be the best option for him.  His gated cardiac CTA shows anatomy suitable for transcatheter aortic valve replacement using a SAPIEN 3 valve.  CTA of the chest shows moderate to large bilateral pleural effusions.  He would benefit from repeat thoracentesis after TAVR.  There is no evidence of progression of his lung cancer on his CT scans and it currently appears under good control on immunotherapy with no lung masses, significant lymphadenopathy, or distant metastasis.  His abdominal and pelvic CTA shows adequate pelvic vascular anatomy to allow transfemoral insertion via the left femoral artery.  His right common iliac artery has significant calcific atherosclerotic plaque that would prevent passing the valve through that side.  The patient was counseled at length regarding treatment alternatives for management of severe symptomatic aortic stenosis. The risks and benefits of surgical intervention has been discussed in detail. Long-term prognosis with medical therapy was discussed. Alternative approaches such as conventional surgical aortic valve replacement, transcatheter aortic valve replacement, and palliative medical therapy were compared and contrasted at length. This discussion was placed in the context of the patient's own specific clinical presentation and past medical history. All of his questions have been addressed.   Following the decision to proceed with transcatheter aortic valve replacement, a discussion was held regarding what types of management strategies would be attempted intraoperatively in the event of life-threatening complications, including whether or not the patient would be considered a  candidate for the use of cardiopulmonary bypass and/or  conversion to open sternotomy for attempted surgical intervention. The patient is aware of the fact that transient use of cardiopulmonary bypass may be necessary.  I do not think he would be a candidate for emergent sternotomy to manage any intraoperative complications.  The patient has been advised of a variety of complications that might develop including but not limited to risks of death, stroke, paravalvular leak, aortic dissection or other major vascular complications, aortic annulus rupture, device embolization, cardiac rupture or perforation, mitral regurgitation, acute myocardial infarction, arrhythmia, heart block or bradycardia requiring permanent pacemaker placement, congestive heart failure, respiratory failure, renal failure, pneumonia, infection, other late complications related to structural valve deterioration or migration, or other complications that might ultimately cause a temporary or permanent loss of functional independence or other long term morbidity. The patient provides full informed consent for the procedure as described and all questions were answered.     DETAILS OF THE OPERATIVE PROCEDURE  PREPARATION:    The patient was brought to the operating room on the above mentioned date and appropriate monitoring was established by the anesthesia team. The patient was placed in the supine position on the operating table.  Intravenous antibiotics were administered. The patient was monitored closely throughout the procedure under conscious sedation.  Baseline transthoracic echocardiogram was performed. The patient's abdomen and both groins were prepped and draped in a sterile manner. A time out procedure was performed.   PERIPHERAL ACCESS:    Using the modified Seldinger technique, femoral arterial and venous access was obtained with placement of 6 Fr sheaths on the right side.  A pigtail diagnostic catheter was passed through the right arterial sheath under fluoroscopic guidance  into the aortic root.  A temporary transvenous pacemaker catheter was passed through the right femoral venous sheath under fluoroscopic guidance into the right ventricle.  The pacemaker was tested to ensure stable lead placement and pacemaker capture. Aortic root angiography was performed in order to determine the optimal angiographic angle for valve deployment.   TRANSFEMORAL ACCESS:   Percutaneous transfemoral access and sheath placement was performed using ultrasound guidance.  The left common femoral artery was cannulated using a micropuncture needle and appropriate location was verified using hand injection angiogram.  A pair of Abbott Perclose percutaneous closure devices were placed and a 6 French sheath replaced into the femoral artery.  The patient was heparinized systemically and ACT verified > 250 seconds.    A 14 Fr transfemoral E-sheath was introduced into the left common femoral artery after progressively dilating over an Amplatz superstiff wire. An AL-2 catheter was used to direct a straight-tip exchange length wire across the native aortic valve into the left ventricle. This was exchanged out for a pigtail catheter and position was confirmed in the LV apex. Simultaneous LV and Ao pressures were recorded.  The pigtail catheter was exchanged for an Amplatz Extra-stiff wire in the LV apex.      BALLOON AORTIC VALVULOPLASTY:   Not performed.    TRANSCATHETER HEART VALVE DEPLOYMENT:   An Edwards Sapien 3 Ultra transcatheter heart valve (size 26 mm, model #9750TFX, serial #[id number]) was prepared and crimped per manufacturer's guidelines, and the proper orientation of the valve is confirmed on the [employer] delivery system. The valve was advanced through the introducer sheath using normal technique until in an appropriate position in the abdominal aorta beyond the sheath tip. The balloon was then retracted and using the fine-tuning wheel was centered on the  valve. The valve was then  advanced across the aortic arch using appropriate flexion of the catheter. The valve was carefully positioned across the aortic valve annulus. The Commander catheter was retracted using normal technique. Once final position of the valve has been confirmed by angiographic assessment, the valve is deployed while temporarily holding ventilation and during rapid ventricular pacing to maintain systolic blood pressure < 50 mmHg and pulse pressure < 10 mmHg. The balloon inflation is held for >3 seconds after reaching full deployment volume. Once the balloon has fully deflated the balloon is retracted into the ascending aorta and valve function is assessed using echocardiography. There is felt to be no paravalvular leak and no central aortic insufficiency.  The mean gradient was 1 mm Hg. LVEF was unchanged at 25%. The patient's hemodynamic recovery following valve deployment is good.  The deployment balloon and guidewire are both removed.    PROCEDURE COMPLETION:   The sheath was removed and femoral artery closure performed.  Protamine was administered once femoral arterial repair was complete. The temporary pacemaker, pigtail catheters and femoral sheaths were removed with manual pressure used for hemostasis.  The patient tolerated the procedure well and is transported to the cath lab recovery area in stable condition. There were no immediate intraoperative complications. All sponge instrument and needle counts are verified correct at completion of the operation.   No blood products were administered during the operation.  The patient received a total of 50 mL of intravenous contrast during the procedure.   Gaye Pollack, MD 03/25/2020

## 2020-03-25 NOTE — Progress Notes (Signed)
Patient ID: Robert Dixon, male   DOB: [birth year], 79 y.o.   MRN: [id number] TCTS  Hemodynamically stable in sinus rhythm.  ECG ok  No complaints, awake and alert, neuro intact.  Groin sites ok.  Postop CXR still pending.  Plan bilateral thoracentesis by IR tomorrow if possible. If the effusions recur after that he will need pleurX catheters placed.

## 2020-03-25 NOTE — Progress Notes (Signed)
Rt radial arterial line d/c'ed by Barbaraann Cao, RN. Pressure held x 10 minutes. Level 0. Gauze and tegaderm dressing. 2+ Palpable radial pulse. Rt hand and fingers warm and pink

## 2020-03-25 NOTE — Op Note (Signed)
[facility]   MULTIDISCIPLINARY HEART VALVE TEAM   TAVR OPERATIVE NOTE  Date of Procedure:  03/25/2020  Preoperative Diagnosis: Severe Aortic Stenosis   Postoperative Diagnosis: Same   Procedure:   Transcatheter Aortic Valve Replacement - Percutaneous  Transfemoral Approach  Edwards Sapien 3 Ultra THV (size 26 mm, model # 9750TFX, serial # [id number])   Co-Surgeons:  Gaye Pollack, MD and [name], MD  Anesthesiologist:  Laurie Panda, MD  Echocardiographer:  Jenkins Rouge, MD  Pre-operative Echo Findings:  Severe aortic stenosis  Severe left ventricular systolic dysfunction  Post-operative Echo Findings:  No paravalvular leak  Severe left ventricular systolic dysfunction  BRIEF CLINICAL NOTE AND INDICATIONS FOR SURGERY  79 yo male is hospitalized with acute on chronic systolic heart failure, severe LV dysfunction, and found to have severe low-flow low gradient aortic stenosis.  He is treated with thoracentesis, IV diuresis, and medical therapy.  He undergoes right and left heart catheterization demonstrating no high-grade obstructive coronary artery disease.  After multidisciplinary heart team review of his case, we elected to proceed with inpatient TAVR based on his acute heart failure and risk of further decompensation.  During the course of the patient's preoperative work up they have been evaluated comprehensively by a multidisciplinary team of specialists coordinated through the [facility] in the [facility].  They have been demonstrated to suffer from symptomatic severe aortic stenosis as noted above. The patient has been counseled extensively as to the relative risks and benefits of all options for the treatment of severe aortic stenosis including long term medical therapy, conventional surgery for aortic valve replacement, and transcatheter aortic valve replacement.  The patient has been independently  evaluated in formal cardiac surgical consultation by Dr Cyndia Bent, who deemed the patient appropriate for TAVR. Based upon review of all of the patient's preoperative diagnostic tests they are felt to be candidate for transcatheter aortic valve replacement using the transfemoral approach as an alternative to conventional surgery.    Following the decision to proceed with transcatheter aortic valve replacement, a discussion has been held regarding what types of management strategies would be attempted intraoperatively in the event of life-threatening complications, including whether or not the patient would be considered a candidate for the use of cardiopulmonary bypass and/or conversion to open sternotomy for attempted surgical intervention.  The patient has been advised of a variety of complications that might develop peculiar to this approach including but not limited to risks of death, stroke, paravalvular leak, aortic dissection or other major vascular complications, aortic annulus rupture, device embolization, cardiac rupture or perforation, acute myocardial infarction, arrhythmia, heart block or bradycardia requiring permanent pacemaker placement, congestive heart failure, respiratory failure, renal failure, pneumonia, infection, other late complications related to structural valve deterioration or migration, or other complications that might ultimately cause a temporary or permanent loss of functional independence or other long term morbidity.  The patient provides full informed consent for the procedure as described and all questions were answered preoperatively.  DETAILS OF THE OPERATIVE PROCEDURE  PREPARATION:   The patient is brought to the operating room on the above mentioned date and central monitoring was established by the anesthesia team including placement of a central venous catheter and radial arterial line. The patient is placed in the supine position on the operating table.  Intravenous  antibiotics are administered. The patient is monitored closely throughout the procedure under conscious sedation.  Baseline transthoracic echocardiogram is performed. The patient's chest,  abdomen, both groins, and both lower extremities are prepared and draped in a sterile manner. A time out procedure is performed.   PERIPHERAL ACCESS:   Using ultrasound guidance, femoral arterial and venous access is obtained with placement of 6 Fr sheaths on the right side.  A pigtail diagnostic catheter was passed through the femoral arterial sheath under fluoroscopic guidance into the aortic root.  A temporary transvenous pacemaker catheter was passed through the femoral venous sheath under fluoroscopic guidance into the right ventricle.  The pacemaker was tested to ensure stable lead placement and pacemaker capture. Aortic root angiography was performed in order to determine the optimal angiographic angle for valve deployment.  TRANSFEMORAL ACCESS:  A micropuncture technique is used to access the left femoral artery under fluoroscopic and ultrasound guidance.  2 Perclose devices are deployed at 10' and 2' positions to 'PreClose' the femoral artery. An 8 French sheath is placed and then an Amplatz Superstiff wire is advanced through the sheath. This is changed out for a 14 French transfemoral E-Sheath after progressively dilating over the Superstiff wire.  An Al-2 catheter was used to direct a straight-tip exchange length wire across the native aortic valve into the left ventricle. This was exchanged out for a pigtail catheter and position was confirmed in the LV apex. Simultaneous LV and Ao pressures were recorded.  The pigtail catheter was exchanged for an Amplatz Extra-stiff wire in the LV apex.    BALLOON AORTIC VALVULOPLASTY:  Not performed  TRANSCATHETER HEART VALVE DEPLOYMENT:  An Edwards Sapien 3 Ultra transcatheter heart valve (size 26 mm) was prepared and crimped per manufacturer's guidelines, and the  proper orientation of the valve is confirmed on the [employer] delivery system. The valve was advanced through the introducer sheath using normal technique until in an appropriate position in the abdominal aorta beyond the sheath tip. The balloon was then retracted and using the fine-tuning wheel was centered on the valve. The valve was then advanced across the aortic arch using appropriate flexion of the catheter. The valve was carefully positioned across the aortic valve annulus. The Commander catheter was retracted using normal technique. Once final position of the valve has been confirmed by angiographic assessment, the valve is deployed while temporarily holding ventilation and during rapid ventricular pacing to maintain systolic blood pressure < 50 mmHg and pulse pressure < 10 mmHg. The balloon inflation is held for >3 seconds after reaching full deployment volume. Once the balloon has fully deflated the balloon is retracted into the ascending aorta and valve function is assessed using echocardiography. The patient's hemodynamic recovery following valve deployment is good.  The deployment balloon and guidewire are both removed. Echo demostrated acceptable post-procedural gradients, stable mitral valve function, and no aortic insufficiency.    PROCEDURE COMPLETION:  The sheath was removed and femoral artery closure is performed using the 2 previously deployed Perclose devices.  Protamine is administered once femoral arterial repair was complete. The site is clear with no evidence of bleeding or hematoma after the sutures are tightened. The temporary pacemaker and pigtail catheters are removed. Manual pressure is used for contralateral femoral arterial hemostasis for the 6 Fr sheath.  The patient tolerated the procedure well and is transported to the surgical intensive care in stable condition. There were no immediate intraoperative complications. All sponge instrument and needle counts are verified  correct at completion of the operation.   The patient received a total of 50 mL of intravenous contrast during the procedure.   [name],  MD 03/25/2020 2:57 PM

## 2020-03-25 NOTE — Progress Notes (Signed)
  Echocardiogram 2D Echocardiogram limited TAVR has been performed.  Darlina Sicilian M 03/25/2020, 1:45 PM

## 2020-03-25 NOTE — Progress Notes (Signed)
Progress Note  Patient Name: Robert Dixon Date of Encounter: 03/25/2020  Primary Cardiologist:   Minus Breeding, MD   Subjective   Breathing OK.  No complaints.    Inpatient Medications    Scheduled Meds: . aspirin  81 mg Oral Daily  . bisacodyl  5 mg Oral Once  . chlorhexidine  15 mL Mouth Rinse BID  . chlorhexidine  15 mL Mouth/Throat Once  . Chlorhexidine Gluconate Cloth  6 each Topical Daily  . docusate sodium  100 mg Oral BID  . fluticasone furoate-vilanterol  1 puff Inhalation Daily   And  . umeclidinium bromide  1 puff Inhalation Daily  . furosemide  40 mg Oral Daily  . heparin injection (subcutaneous)  5,000 Units Subcutaneous Q8H  . magnesium sulfate  40 mEq Other To OR  . mouth rinse  15 mL Mouth Rinse q12n4p  . pantoprazole  40 mg Oral BID  . polycarbophil  625 mg Oral BID  . potassium chloride  80 mEq Other To OR  . sodium chloride flush  10-40 mL Intracatheter Q12H  . sodium chloride flush  3 mL Intravenous Q12H  . sodium chloride flush  3 mL Intravenous Q12H  . tamsulosin  0.4 mg Oral Daily   Continuous Infusions: . sodium chloride    . cefUROXime (ZINACEF)  IV    . dexmedetomidine    . heparin 30,000 units/NS 1000 mL solution for CELLSAVER    . norepinephrine (LEVOPHED) Adult infusion    . vancomycin     PRN Meds: sodium chloride, acetaminophen **OR** acetaminophen, albuterol, alum & mag hydroxide-simeth, dimenhyDRINATE, guaiFENesin-dextromethorphan, lip balm, ondansetron **OR** ondansetron (ZOFRAN) IV, polyethylene glycol, sodium chloride flush, sodium chloride flush, traMADol   Vital Signs    Vitals:   03/24/20 2101 03/25/20 0105 03/25/20 0300 03/25/20 0433  BP:  (!) 107/49 (!) 102/59 107/61  Pulse: 99 100 98 100  Resp: 20 17 20 20   Temp:  97.9 F (36.6 C) 97.6 F (36.4 C) 97.6 F (36.4 C)  TempSrc:  Oral Oral Oral  SpO2: 97% 95% 94% 95%  Weight:   70.5 kg   Height:        Intake/Output Summary (Last 24 hours) at 03/25/2020  0756 Last data filed at 03/25/2020 0200 Gross per 24 hour  Intake 620 ml  Output 2215 ml  Net -1595 ml   Filed Weights   03/23/20 0614 03/24/20 0332 03/25/20 0300  Weight: 68.6 kg 69.3 kg 70.5 kg    Telemetry    NSR - Personally Reviewed  Physical Exam   GEN: No  acute distress.   Neck: No  JVD Cardiac: RRR, 3/6 apical systolic murmur, no diastolic murmurs, rubs, or gallops.  Respiratory: Clear   to auscultation bilaterally. GI: Soft, nontender, non-distended, normal bowel sounds  MS:  No edema; No deformity. Neuro:   Nonfocal  Psych: Oriented and appropriate    Labs    Chemistry Recent Labs  Lab 03/23/20 0309 03/24/20 0500 03/25/20 0231  NA 135 141 138  K 3.1* 4.1 4.1  CL 95* 99 99  CO2 31 34* 31  GLUCOSE 179* 90 141*  BUN 35* 35* 34*  CREATININE 1.31* 1.12 1.17  CALCIUM 8.3* 8.1* 7.9*  GFRNONAA 52* >60 59*  GFRAA >60 >60 >60  ANIONGAP 9 8 8      Hematology Recent Labs  Lab 03/23/20 0309 03/24/20 0500 03/25/20 0231  WBC 8.0 6.9 6.2  RBC 2.98* 2.75* 2.56*  HGB 11.6* 11.0* 10.3*  HCT 35.6* 33.5* 31.0*  MCV 119.5* 121.8* 121.1*  MCH 38.9* 40.0* 40.2*  MCHC 32.6 32.8 33.2  RDW 14.7 14.6 14.5  PLT 207 193 161    Cardiac EnzymesNo results for input(s): TROPONINI in the last 168 hours. No results for input(s): TROPIPOC in the last 168 hours.   BNP No results for input(s): BNP, PROBNP in the last 168 hours.   DDimer No results for input(s): DDIMER in the last 168 hours.   Radiology    DG Chest 2 View  Result Date: 03/24/2020 CLINICAL DATA:  Preop evaluation EXAM: CHEST - 2 VIEW COMPARISON:  03/23/2011 FINDINGS: Cardiac shadow remains mildly enlarged. Small right-sided pleural effusion remains posteriorly decreased from the prior exam. Moderate-sized left pleural effusion is noted with left basilar consolidation similar to that seen on recent CT examination. No new focal infiltrate is seen. Right chest wall port is stable. No acute bony abnormality  is noted. Chronic compression deformity in the midthoracic spine is again noted. IMPRESSION: Bilateral pleural effusions left greater than right but improved when compared with the prior CT examination. Left basilar consolidation stable from the prior CT. Electronically Signed   By: Inez Catalina M.D.   On: 03/24/2020 22:03    Cardiac Studies    Echo: IMPRESSIONS   1. Hypokinesis of the distal anteroseptal wall and apex; overall moderate  to severe LV dysfunction; calcified aortic valve with probable severe AS  (mean gradient 31 mmHg; AVA 1 cm2) and mild AI; mild MR; mild LAE; small  pericardial effusion with RA  inversion.  2. Left ventricular ejection fraction, by estimation, is 30 to 35%. The  left ventricle has moderate to severely decreased function. The left  ventricle has no regional wall motion abnormalities. The left ventricular  internal cavity size was mildly  dilated. There is mild left ventricular hypertrophy. Left ventricular  diastolic parameters are indeterminate. Elevated left atrial pressure.  3. Right ventricular systolic function is normal. The right ventricular  size is normal. There is mildly elevated pulmonary artery systolic  pressure.  4. Left atrial size was mildly dilated.  5. The mitral valve is normal in structure. Mild mitral valve  regurgitation. No evidence of mitral stenosis.  6. The aortic valve has an indeterminant number of cusps. Aortic valve  regurgitation is mild. No aortic stenosis is present.    Cath 1.  Calcific, nonobstructive coronary artery disease as outlined 2.  Preserved cardiac output of 7.56 L/min and cardiac index 4.16 3.  Known severe calcific aortic stenosis (stage D2 low-flow low gradient) with inability to cross the aortic valve with a wire 4.  Intracardiac hemodynamics suggestive of congestive heart failure with pulmonary capillary wedge pressure 31 mmHg, PA pressure 52/29 with a mean of 40  Patient Profile     79 y.o.  male  from [place] with severe aortic stenosis, chronic systolic heart failure, lung cancer with favorable prognosis  Assessment & Plan    AS:   Plan for TAVR today.   ACUTE ON CHRONIC SYSTOLIC HF:     EF is reduced compared to previous.  Net negative 9.9 liters since admission.   He dose have still evidence of volume overload with few crackles and pleural effusions.  Continue PO diuresis.       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  No change in therapy. Continue with risk reduction.     For questions or updates, please contact [place] Please consult [email or web address] for contact  info under Cardiology/STEMI.   Signed, Minus Breeding, MD  03/25/2020, 7:56 AM

## 2020-03-25 NOTE — Transfer of Care (Signed)
Immediate Anesthesia Transfer of Care Note  Patient: Robert Dixon  Procedure(s) Performed: TRANSCATHETER AORTIC VALVE REPLACEMENT, LEFT TRANSFEMORAL (Left ) TRANSESOPHAGEAL ECHOCARDIOGRAM (TEE) (N/A )  Patient Location: Cath Lab  Anesthesia Type:MAC  Level of Consciousness: awake, oriented and patient cooperative  Airway & Oxygen Therapy: Patient Spontanous Breathing and Patient connected to face mask oxygen  Post-op Assessment: Report given to RN and Post -op Vital signs reviewed and stable  BP 105/51 aline; spo2 96% Post vital signs: Reviewed and stable  Last Vitals:  Vitals Value Taken Time  BP 83/55 03/25/20 1412  Temp 36.6 C 03/25/20 1406  Pulse 82 03/25/20 1413  Resp 13 03/25/20 1413  SpO2 94 % 03/25/20 1413  Vitals shown include unvalidated device data.  Last Pain:  Vitals:   03/25/20 1406  TempSrc: Temporal  PainSc: Asleep      Patients Stated Pain Goal: 3 (03/75/43 6067)  Complications: No apparent anesthesia complications

## 2020-03-25 NOTE — Progress Notes (Signed)
PT Cancellation Note  Patient Details Name: Robert Dixon MRN: [id number] DOB: 11/20/1941   Cancelled Treatment:    Reason Eval/Treat Not Completed: Patient at procedure or test/unavailable   Shary Decamp [facility] 03/25/2020, 12:53 PM  [place] Pager [phone] Office [phone]

## 2020-03-25 NOTE — Progress Notes (Signed)
Pt transporting to cath lab for TAVR. Pt clipped and CHG bath completed. Pt's son at bedside. Pt's belongings left in room.

## 2020-03-25 NOTE — Progress Notes (Signed)
Site area:  Rt groin fa and fv sheaths pulled by Clarene Critchley Site Prior to Removal:  Level 0 Pressure Applied For: 20 minutes Manual:   yes Patient Status During Pull:  stable Post Pull Site:  Level 0 Post Pull Instructions Given:   yes Post Pull Pulses Present: rt dp palpable Dressing Applied:  Gauze and tegaderm Bedrest begins @ 6924 Comments:

## 2020-03-25 NOTE — Anesthesia Preprocedure Evaluation (Signed)
Anesthesia Evaluation  Patient identified by MRN, date of birth, ID band Patient awake    Reviewed: Allergy & Precautions, NPO status , Patient's Chart, lab work & pertinent test results  History of Anesthesia Complications (+) PONV and history of anesthetic complications  Airway Mallampati: III  TM Distance: >3 FB Neck ROM: Full    Dental  (+) Dental Advisory Given, Teeth Intact   Pulmonary shortness of breath, neg sleep apnea, COPD,  oxygen dependent, neg recent URI, former smoker,  stage IIIb NSCLC s/p chemo/XRT    breath sounds clear to auscultation       Cardiovascular hypertension, (-) angina+ CAD and +CHF  + Valvular Problems/Murmurs AS  Rhythm:Regular + Systolic murmurs 1. Hypokinesis of the distal anteroseptal wall and apex; overall moderate  to severe LV dysfunction; calcified aortic valve with probable severe AS  (mean gradient 31 mmHg; AVA 1 cm2) and mild AI; mild MR; mild LAE; small  pericardial effusion with RA  inversion.  2. Left ventricular ejection fraction, by estimation, is 30 to 35%. The  left ventricle has moderate to severely decreased function. The left  ventricle has no regional wall motion abnormalities. The left ventricular  internal cavity size was mildly  dilated. There is mild left ventricular hypertrophy. Left ventricular  diastolic parameters are indeterminate. Elevated left atrial pressure.  3. Right ventricular systolic function is normal. The right ventricular  size is normal. There is mildly elevated pulmonary artery systolic  pressure.  4. Left atrial size was mildly dilated.  5. The mitral valve is normal in structure. Mild mitral valve  regurgitation. No evidence of mitral stenosis.  6. The aortic valve has an indeterminant number of cusps. Aortic valve  regurgitation is mild. No aortic stenosis is present.    1.  Calcific, nonobstructive coronary artery disease as outlined 2.   Preserved cardiac output of 7.56 L/min and cardiac index 4.16 3.  Known severe calcific aortic stenosis (stage D2 low-flow low gradient) with inability to cross the aortic valve with a wire 4.  Intracardiac hemodynamics suggestive of congestive heart failure with pulmonary capillary wedge pressure 31 mmHg, PA pressure 52/29 with a mean of 40   Neuro/Psych  Headaches, negative psych ROS   GI/Hepatic Neg liver ROS, hiatal hernia, GERD  Medicated and Controlled,  Endo/Other  negative endocrine ROS  Renal/GU Renal disease     Musculoskeletal negative musculoskeletal ROS (+)   Abdominal   Peds  Hematology  (+) Blood dyscrasia, anemia ,   Anesthesia Other Findings   Reproductive/Obstetrics                             Anesthesia Physical Anesthesia Plan  ASA: IV  Anesthesia Plan: MAC   Post-op Pain Management:    Induction: Intravenous  PONV Risk Score and Plan: 2 and Treatment may vary due to age or medical condition  Airway Management Planned: Mask  Additional Equipment: Arterial line  Intra-op Plan:   Post-operative Plan:   Informed Consent: I have reviewed the patients History and Physical, chart, labs and discussed the procedure including the risks, benefits and alternatives for the proposed anesthesia with the patient or authorized representative who has indicated his/her understanding and acceptance.     Dental advisory given  Plan Discussed with: CRNA and Surgeon  Anesthesia Plan Comments:         Anesthesia Quick Evaluation

## 2020-03-26 ENCOUNTER — Telehealth: Payer: Self-pay | Admitting: Medical Oncology

## 2020-03-26 ENCOUNTER — Telehealth: Payer: Self-pay | Admitting: *Deleted

## 2020-03-26 ENCOUNTER — Inpatient Hospital Stay (HOSPITAL_COMMUNITY): Payer: Medicare HMO

## 2020-03-26 ENCOUNTER — Telehealth: Payer: Self-pay | Admitting: Internal Medicine

## 2020-03-26 DIAGNOSIS — Z952 Presence of prosthetic heart valve: Secondary | ICD-10-CM

## 2020-03-26 HISTORY — PX: IR THORACENTESIS ASP PLEURAL SPACE W/IMG GUIDE: IMG5380

## 2020-03-26 LAB — BASIC METABOLIC PANEL
Anion gap: 9 (ref 5–15)
BUN: 30 mg/dL — ABNORMAL HIGH (ref 8–23)
CO2: 29 mmol/L (ref 22–32)
Calcium: 8.4 mg/dL — ABNORMAL LOW (ref 8.9–10.3)
Chloride: 99 mmol/L (ref 98–111)
Creatinine, Ser: 1.17 mg/dL (ref 0.61–1.24)
GFR calc Af Amer: >60 mL/min (ref >60)
GFR calc non Af Amer: 59 mL/min — ABNORMAL LOW (ref >60)
Glucose, Bld: 137 mg/dL — ABNORMAL HIGH (ref 70–99)
Potassium: 4.2 mmol/L (ref 3.5–5.1)
Sodium: 137 mmol/L (ref 135–145)

## 2020-03-26 LAB — CBC
HCT: 31.8 % — ABNORMAL LOW (ref 39.0–52.0)
Hemoglobin: 10.4 g/dL — ABNORMAL LOW (ref 13.0–17.0)
MCH: 38.8 pg — ABNORMAL HIGH (ref 26.0–34.0)
MCHC: 32.7 g/dL (ref 30.0–36.0)
MCV: 118.7 fL — ABNORMAL HIGH (ref 80.0–100.0)
Platelets: 149 10*3/uL — ABNORMAL LOW (ref 150–400)
RBC: 2.68 MIL/uL — ABNORMAL LOW (ref 4.22–5.81)
RDW: 14.4 % (ref 11.5–15.5)
WBC: 11.1 10*3/uL — ABNORMAL HIGH (ref 4.0–10.5)
nRBC: 0 % (ref 0.0–0.2)

## 2020-03-26 LAB — BODY FLUID CELL COUNT WITH DIFFERENTIAL
Lymphs, Fluid: 68 %
Monocyte-Macrophage-Serous Fluid: 10 % — ABNORMAL LOW (ref 50–90)
Neutrophil Count, Fluid: 22 % (ref 0–25)
Total Nucleated Cell Count, Fluid: 233 cu mm (ref 0–1000)

## 2020-03-26 LAB — GRAM STAIN

## 2020-03-26 LAB — MAGNESIUM: Magnesium: 2.3 mg/dL (ref 1.7–2.4)

## 2020-03-26 LAB — ECHOCARDIOGRAM COMPLETE
Height: 68 in
Weight: 2476.21 oz

## 2020-03-26 LAB — ABO/RH: ABO/RH(D): B NEG

## 2020-03-26 MED ORDER — SODIUM CHLORIDE 0.9% FLUSH: 10.0000 mL | INTRAVENOUS | Status: DC | PRN

## 2020-03-26 MED ORDER — ATORVASTATIN CALCIUM 40 MG PO TABS
40.0000 mg | ORAL_TABLET | Freq: Every day | ORAL | Status: DC
  Administered 2020-03-26 – 2020-03-28 (×3): 40 mg via ORAL
  Filled 2020-03-26 (×3): qty 1

## 2020-03-26 MED ORDER — METOPROLOL SUCCINATE ER 25 MG PO TB24
12.5000 mg | ORAL_TABLET | Freq: Every day | ORAL | Status: DC
  Administered 2020-03-26 – 2020-03-28 (×3): 12.5 mg via ORAL
  Filled 2020-03-26 (×3): qty 1

## 2020-03-26 MED ORDER — LIDOCAINE HCL (PF) 1 % IJ SOLN
INTRAMUSCULAR | Status: DC | PRN
  Administered 2020-03-26: 10 mL

## 2020-03-26 MED ORDER — LIDOCAINE HCL 1 % IJ SOLN
INTRAMUSCULAR | Status: AC
Start: 2020-03-26 — End: 2020-03-26
  Filled 2020-03-26: qty 20

## 2020-03-26 MED ORDER — SODIUM CHLORIDE 0.9% FLUSH
10.0000 mL | Freq: Two times a day (BID) | INTRAVENOUS | Status: DC
  Administered 2020-03-26 – 2020-03-28 (×2): 10 mL

## 2020-03-26 NOTE — Telephone Encounter (Signed)
Rec'd completed paperwork - fwd to [place] via interoffice mail -pr

## 2020-03-26 NOTE — Progress Notes (Signed)
Occupational Therapy Treatment Note  Pt making good progress although fatigued this pm after sitting in chair for an hour. Educated on energy conservation and strategies to reduce risk of falls. Pt complained f dizziness initially upon standing but improved as activity increased. VSS during session on 3L. Will continue to follow acutely and recommend HHOT after DC.     03/26/20 1553  OT Visit Information  Last OT Received On 03/26/20  Assistance Needed +1  History of Present Illness 79 y/o male with advanced non-small cell lung cancer and aortic stenosis admitted 03/16/20 for acute on chronic repiratory failure with hypoxemia in the setting of a recurrent left sided pleural effusion. On Home oxygen 3 L. Pt with TAVR 03/25/20. Thoracentesis on 03/26/20.   Precautions  Precautions Fall  Precaution Comments monitor sats, HR  Pain Assessment  Pain Assessment No/denies pain  Cognition  Arousal/Alertness Awake/alert  Behavior During Therapy Anxious  Overall Cognitive Status Within Functional Limits for tasks assessed  Upper Extremity Assessment  Upper Extremity Assessment Generalized weakness  Lower Extremity Assessment  Lower Extremity Assessment Defer to PT evaluation  ADL  Lower Body Bathing Min guard;Sit to/from stand  Lower Body Dressing Min guard;Sitting/lateral leans;Sit to/from stand  Toileting- Music therapist guard;Sit to/from stand  Functional mobility during ADLs Min guard (without use of RW)  General ADL Comments Educated on energy conservation and need to use shower seat wthen bathing and have S from family initially when bathing. Pt verbalized understanding. Pt has a rollator at home and he states that he does not usually use it. REcommend pt use his rollator to help with funcitonal tasks. Pt verbalized understanding. Also discussed risk of falls and possibility of looking into a fall alert eyetem.   Bed Mobility  Overal bed mobility Needs Assistance  Sit  to supine Supervision  Balance  Overall balance assessment Needs assistance  Sitting balance-Leahy Scale Good  Standing balance-Leahy Scale Fair  Transfers  Overall transfer level Needs assistance  Transfers Sit to/from Stand;Stand Pivot Transfers  Sit to Stand Min guard  Stand pivot transfers Min guard  General Comments  General comments (skin integrity, edema, etc.) encouraged chair level exercises - chair marching; SAQ; toe lifts; pt has theraband at home - plan to complete with [facility] therapists  OT - End of Session  Equipment Utilized During Treatment Oxygen (3L)  Activity Tolerance Patient tolerated treatment well  Patient left in bed;with call bell/phone within reach;with bed alarm set  Nurse Communication Mobility status  OT Assessment/Plan  OT Plan Discharge plan remains appropriate  OT Visit Diagnosis Unsteadiness on feet (R26.81);Muscle weakness (generalized) (M62.81)  OT Frequency (ACUTE ONLY) Min 2X/week  Follow Up Recommendations Home health OT;Supervision - Intermittent  OT Equipment None recommended by OT  AM-PAC OT "6 Clicks" Daily Activity Outcome Measure (Version 2)  Help from another person eating meals? 4  Help from another person taking care of personal grooming? 3  Help from another person toileting, which includes using toliet, bedpan, or urinal? 3  Help from another person bathing (including washing, rinsing, drying)? 3  Help from another person to put on and taking off regular upper body clothing? 3  Help from another person to put on and taking off regular lower body clothing? 3  6 Click Score 19  OT Goal Progression  Progress towards OT goals Progressing toward goals  Acute Rehab OT Goals  Patient Stated Goal to be able to go home,  OT Goal Formulation With patient  Time  For Goal Achievement 03/31/20  Potential to Achieve Goals Good  ADL Goals  Pt Will Perform Grooming with modified independence;standing;sitting  Pt Will Perform Lower Body Dressing  with modified independence;sit to/from stand;sitting/lateral leans  Pt Will Transfer to Toilet with modified independence;ambulating;grab bars  Pt Will Perform Toileting - Clothing Manipulation and hygiene with modified independence;sitting/lateral leans;sit to/from stand  Additional ADL Goal #1 Patient will identify 3 energy conservation strategies to implement at home in order to maximize safety and independence with self care.  OT Time Calculation  OT Start Time (ACUTE ONLY) 1530  OT Stop Time (ACUTE ONLY) 1551  OT Time Calculation (min) 21 min  OT General Charges  $OT Visit 1 Visit  OT Treatments  $Self Care/Home Management  8-22 mins  Maurie Boettcher, OT/L   Acute OT Clinical Specialist [place] Pager [phone] Office [phone]

## 2020-03-26 NOTE — Telephone Encounter (Signed)
Scheduled appt per 4/30 sch message - pt aware of appts added / still in the hospital will look at his discharge papers for appts

## 2020-03-26 NOTE — Progress Notes (Signed)
Echocardiogram 2D Echocardiogram has been performed.  Robert Dixon 03/26/2020, 8:41 AM

## 2020-03-26 NOTE — Telephone Encounter (Signed)
Pt aware appts cancelled.  He will call back for new appts  after he is discharged from the hospital.

## 2020-03-26 NOTE — Progress Notes (Signed)
Have checked with pt x2 today and now PT seeing. Will f/u tomorrow. Yves Dill CES, ACSM 2:48 PM 03/26/2020

## 2020-03-26 NOTE — Procedures (Signed)
PROCEDURE SUMMARY:  Successful US guided left thoracentesis. Yielded 750 ml of clear yellow fluid. Pt tolerated procedure well. No immediate complications.  Specimen sent for labs. CXR ordered.  EBL < 5 mL  Theresa Duty, NP 03/26/2020 12:37 PM

## 2020-03-26 NOTE — Anesthesia Postprocedure Evaluation (Signed)
Anesthesia Post Note  Patient: Robert Dixon  Procedure(s) Performed: TRANSCATHETER AORTIC VALVE REPLACEMENT, LEFT TRANSFEMORAL (Left ) TRANSESOPHAGEAL ECHOCARDIOGRAM (TEE) (N/A )     Patient location during evaluation: Cath Lab Anesthesia Type: MAC Level of consciousness: awake and alert Pain management: pain level controlled Vital Signs Assessment: post-procedure vital signs reviewed and stable Respiratory status: spontaneous breathing, nonlabored ventilation, respiratory function stable and patient connected to nasal cannula oxygen Cardiovascular status: stable Postop Assessment: no apparent nausea or vomiting Anesthetic complications: no    Last Vitals:  Vitals:   03/26/20 0312 03/26/20 0811  BP: (!) 113/99 102/68  Pulse: (!) 108 (!) 104  Resp: 20 17  Temp: 36.7 C 36.6 C  SpO2: 95% 96%    Last Pain:  Vitals:   03/26/20 0811  TempSrc: Oral  PainSc: 0-No pain                 [name]

## 2020-03-26 NOTE — Discharge Instructions (Signed)
ACTIVITY AND EXERCISE °• Daily activity and exercise are an important part of your recovery. People recover at different rates depending on their general health and type of valve procedure. °• Most people recovering from TAVR feel better relatively quickly  °• No lifting, pushing, pulling more than 10 pounds (examples to avoid: groceries, vacuuming, gardening, golfing): °            - For one week with a procedure through the groin. °            - For six weeks for procedures through the chest wall or neck. °NOTE: You will typically see one of our providers 7-14 days after your procedure to discuss WHEN TO RESUME the above activities.  °  °  °DRIVING °• Do not drive until you are seen for follow up and cleared by a provider. Generally, we ask patient to not drive for 1 week after their procedure. °• If you have been told by your doctor in the past that you may not drive, you must talk with him/her before you begin driving again. °  °DRESSING °• Groin site: you may leave the clear dressing over the site for up to one week or until it falls off. °  °HYGIENE °• If you had a femoral (leg) procedure, you may take a shower when you return home. After the shower, pat the site dry. Do NOT use powder, oils or lotions in your groin area until the site has completely healed. °• If you had a chest procedure, you may shower when you return home unless specifically instructed not to by your discharging practitioner. °            - DO NOT scrub incision; pat dry with a towel. °            - DO NOT apply any lotions, oils, powders to the incision. °            - No tub baths / swimming for at least 2 weeks. °• If you notice any fevers, chills, increased pain, swelling, bleeding or pus, please contact your doctor. °  °ADDITIONAL INFORMATION °• If you are going to have an upcoming dental procedure, please contact our office as you will require antibiotics ahead of time to prevent infection on your heart valve.  ° ° °If you have any  questions or concerns you can call the structural heart phone during normal business hours 8am-4pm. If you have an urgent need after hours or weekends please call [phone] to talk to the on call provider for general cardiology. If you have an emergency that requires immediate attention, please call 911.  ° ° °After TAVR Checklist ° °Check  Test Description  ° Follow up appointment in 1-2 weeks  You will see our structural heart physician assistant, Katie Kalany Diekmann. Your incision sites will be checked and you will be cleared to drive and resume all normal activities if you are doing well.    ° 1 month echo and follow up  You will have an echo to check on your new heart valve and be seen back in the office by Katie Kati Riggenbach. Many times the echo is not read by your appointment time, but Katie will call you later that day or the following day to report your results.  ° Follow up with your primary cardiologist You will need to be seen by your primary cardiologist in the following 3-6 months after your 1 month appointment in the valve   clinic. Often times your Plavix or Aspirin will be discontinued during this time, but this is decided on a case by case basis.   ° 1 year echo and follow up You will have another echo to check on your heart valve after 1 year and be seen back in the office by Katie Zayonna Ayuso. This your last structural heart visit.  ° Bacterial endocarditis prophylaxis  You will have to take antibiotics for the rest of your life before all dental procedures (even teeth cleanings) to protect your heart valve. Antibiotics are also required before some surgeries. Please check with your cardiologist before scheduling any surgeries. Also, please make sure to tell us if you have a penicillin allergy as you will require an alternative antibiotic.   ° ° °

## 2020-03-26 NOTE — Telephone Encounter (Signed)
Oncology Nurse Navigator Documentation  Oncology Nurse Navigator Flowsheets 03/26/2020  Abnormal Finding Date -  Confirmed Diagnosis Date -  Navigation Complete Date: -  Reason Not Navigating Patient: -  Navigator Location [facility]  Referral Date to RadOnc/MedOnc -  Navigator Encounter Type Telephone  Telephone Outgoing Call/Cassie updated me that patient is in the hospital at this time and needs to be rescheduled for a follow up appt.  I called and spoke with Ms. Feltus and updated her that appt on 5/3 is cancelled and scheduling will call with a follow up appt.  She verbalized understanding.    Patient Visit Type Inpatient  Treatment Phase Treatment  Barriers/Navigation Needs Coordination of Care;Education  Education Other  Interventions Coordination of Care;Education  Acuity Level 2-Minimal Needs (1-2 Barriers Identified)  Coordination of Care Other  Education Method Verbal  Time Spent with Patient 30

## 2020-03-26 NOTE — Progress Notes (Addendum)
[place] VALVE TEAM  Patient Name: Robert Dixon Date of Encounter: 03/26/2020  Primary Cardiologist: Dr. [facility] Problem List     Principal Problem:   Acute on chronic respiratory failure with hypoxia [name]) Active Problems:   Non-small cell carcinoma of left lung, stage 3 (HCC)   Pleural effusion   Severe aortic stenosis   Pericardial effusion   COPD exacerbation (HCC)     Subjective   Sitting up on bed. Says he feels a bit anxious. Breathing okay.  Inpatient Medications    Scheduled Meds: . aspirin  81 mg Oral Daily  . Chlorhexidine Gluconate Cloth  6 each Topical Daily  . clopidogrel  75 mg Oral Q breakfast  . docusate sodium  100 mg Oral BID  . fluticasone furoate-vilanterol  1 puff Inhalation Daily   And  . umeclidinium bromide  1 puff Inhalation Daily  . furosemide  40 mg Oral Daily  . heparin injection (subcutaneous)  5,000 Units Subcutaneous Q8H  . pantoprazole  40 mg Oral BID  . polycarbophil  625 mg Oral BID  . sodium chloride flush  3 mL Intravenous Q12H  . tamsulosin  0.4 mg Oral Daily   Continuous Infusions: . sodium chloride    . sodium chloride    . cefUROXime (ZINACEF)  IV 1.5 g (03/26/20 0810)  . nitroGLYCERIN    . phenylephrine (NEO-SYNEPHRINE) Adult infusion     PRN Meds: sodium chloride, sodium chloride, acetaminophen **OR** acetaminophen, albuterol, alum & mag hydroxide-simeth, dimenhyDRINATE, guaiFENesin-dextromethorphan, lip balm, morphine injection, ondansetron (ZOFRAN) IV, ondansetron **OR** [DISCONTINUED] ondansetron (ZOFRAN) IV, oxyCODONE, polyethylene glycol, sodium chloride flush, sodium chloride flush, traMADol   Vital Signs    Vitals:   03/25/20 2011 03/26/20 0000 03/26/20 0312 03/26/20 0811  BP: 100/74 (!) 109/59 (!) 113/99 102/68  Pulse: 100 100 (!) 108 (!) 104  Resp: 14 19 20 17   Temp: 98.1 F (36.7 C) 98.2 F (36.8 C) 98.1 F (36.7 C) 97.8 F (36.6 C)  TempSrc:  Oral Oral Oral Oral  SpO2: 98% 95% 95% 96%  Weight:   70.2 kg   Height:        Intake/Output Summary (Last 24 hours) at 03/26/2020 0857 Last data filed at 03/26/2020 0813 Gross per 24 hour  Intake 1142.17 ml  Output 1045 ml  Net 97.17 ml   Filed Weights   03/24/20 0332 03/25/20 0300 03/26/20 0312  Weight: 69.3 kg 70.5 kg 70.2 kg    Physical Exam   GEN: Well nourished, well developed, in no acute distress.  HEENT: Grossly normal.  Neck: Supple, no JVD, carotid bruits, or masses. Cardiac: tachy, no murmurs, rubs, or gallops. No clubbing, cyanosis, edema.   Respiratory:  Decreased breath sounds at lateral bases GI: Soft, nontender, nondistended, BS + x 4. MS: no deformity or atrophy. Skin: warm and dry, no rash.  Groin sites clear without hematoma or ecchymosis  Neuro:  Strength and sensation are intact. Psych: AAOx3.  Normal affect.  Labs    CBC Recent Labs    03/25/20 0231 03/25/20 1251 03/25/20 1441 03/26/20 0607  WBC 6.2  --   --  11.1*  HGB 10.3*   < > 9.5* 10.4*  HCT 31.0*   < > 28.0* 31.8*  MCV 121.1*  --   --  118.7*  PLT 161  --   --  149*   < > = values in this interval not displayed.   Basic Metabolic  Panel Recent Labs    03/25/20 0231 03/25/20 1251 03/25/20 1441 03/26/20 0607  NA 138   < > 136 137  K 4.1   < > 4.3 4.2  CL 99   < > 95* 99  CO2 31  --   --  29  GLUCOSE 141*   < > 120* 137*  BUN 34*   < > 28* 30*  CREATININE 1.17   < > 1.00 1.17  CALCIUM 7.9*  --   --  8.4*  MG  --   --   --  2.3   < > = values in this interval not displayed.   Liver Function Tests No results for input(s): AST, ALT, ALKPHOS, BILITOT, PROT, ALBUMIN in the last 72 hours. No results for input(s): LIPASE, AMYLASE in the last 72 hours. Cardiac Enzymes No results for input(s): CKTOTAL, CKMB, CKMBINDEX, TROPONINI in the last 72 hours. BNP Invalid input(s): POCBNP D-Dimer No results for input(s): DDIMER in the last 72 hours. Hemoglobin A1C No results for  input(s): HGBA1C in the last 72 hours. Fasting Lipid Panel No results for input(s): CHOL, HDL, LDLCALC, TRIG, CHOLHDL, LDLDIRECT in the last 72 hours. Thyroid Function Tests No results for input(s): TSH, T4TOTAL, T3FREE, THYROIDAB in the last 72 hours.  Invalid input(s): FREET3  Telemetry    Sinus tachy at rest - Personally Reviewed  ECG    Sinus tach with 1st deg AV block, non specific TW abnormalities, HR 101 - Personally Reviewed  Radiology    DG Chest 2 View  Result Date: 03/24/2020 CLINICAL DATA:  Preop evaluation EXAM: CHEST - 2 VIEW COMPARISON:  03/23/2011 FINDINGS: Cardiac shadow remains mildly enlarged. Small right-sided pleural effusion remains posteriorly decreased from the prior exam. Moderate-sized left pleural effusion is noted with left basilar consolidation similar to that seen on recent CT examination. No new focal infiltrate is seen. Right chest wall port is stable. No acute bony abnormality is noted. Chronic compression deformity in the midthoracic spine is again noted. IMPRESSION: Bilateral pleural effusions left greater than right but improved when compared with the prior CT examination. Left basilar consolidation stable from the prior CT. Electronically Signed   By: Inez Catalina M.D.   On: 03/24/2020 22:03   DG Chest Port 1 View  Result Date: 03/25/2020 CLINICAL DATA:  Encounter for status post TAVR. EXAM: PORTABLE CHEST 1 VIEW COMPARISON:  Chest radiograph yesterday. Chest CT 03/22/2020 FINDINGS: Post TAVR. Stable mild cardiomegaly. Unchanged mediastinal contours. Aortic atherosclerosis. Right chest port remains in place with tip in the SVC. Moderate left pleural effusion which is partially loculated, similar to slightly improved from yesterday. Small right pleural effusion which was better appreciated on lateral view yesterday. No pneumothorax or pulmonary edema. Apical predominant emphysema. IMPRESSION: 1. Post TAVR. Stable mild cardiomegaly. 2. Moderate left pleural  effusion which is similar to slightly improved from yesterday. 3. Small right pleural effusion, better appreciated on prior imaging, but grossly similar. Aortic Atherosclerosis (ICD10-I70.0) and Emphysema (ICD10-J43.9). Electronically Signed   By: Keith Rake M.D.   On: 03/25/2020 16:38   ECHOCARDIOGRAM LIMITED  Result Date: 03/25/2020    ECHOCARDIOGRAM LIMITED REPORT   Patient Name:   CHIDUBEM CHAIRES Date of Exam: 03/25/2020 Medical Rec #:  [id number]      Height:       68.0 in Accession #:    [id number]     Weight:       155.4 lb Date of Birth:  [birth year]  BSA:          1.836 m Patient Age:    79 years       BP:           99/62 mmHg Patient Gender: M              HR:           92 bpm. Exam Location:  Inpatient Procedure: Limited Echo and Limited Color Doppler Indications:     Aortic Stenosis 424.1 / 132.0  History:         Patient has prior history of Echocardiogram examinations, most                  recent 03/16/2020. CHF, COPD, Signs/Symptoms:Shortness of                  Breath; Risk Factors:Hypertension and Former Smoker. GERD. Lung                  Cancer.                  Aortic Valve: 26 mm Edwards Sapien prosthetic, stented (TAVR)                  valve is present in the aortic position. Procedure Date:                  03/25/2020.  Sonographer:     Darlina Sicilian RDCS Referring Phys:  [id number] Woodfin Ganja THOMPSON Diagnosing Phys: Jenkins Rouge MD IMPRESSIONS  1. Septal and apical akinesis with generalized global hypokinesis Post implant and long pacing run no change in EF . Left ventricular ejection fraction, by estimation, is 25 to 30%. The left ventricle has severely decreased function. The left ventricle demonstrates regional wall motion abnormalities (see scoring diagram/findings for description).  2. No change in pericardial effusion post implant . The pericardial effusion is anterior to the right ventricle and posterior and lateral to the left ventricle.  3. The mitral valve is degenerative.  Trivial mitral valve regurgitation.  4. Pre TAVR: Tri leaflet AV with severe calcification and restricted leaflet motion Mild AR Severe low flow low gradient AS. AVA 1.1 cm2, peak gradient 28 mmHg mean 17 mmHg.         Post TAVR: well positioned 26 mm Sapien 3 Ultra valve No PVL. Peak gradient 2 mmHg mean gradient 1 mmHg AVA 3.3 cm2. The aortic valve has been repaired/replaced. Aortic valve regurgitation is not visualized. No aortic stenosis is present. There is a 26 mm Edwards Sapien prosthetic (TAVR) valve present in the aortic position. Procedure Date: 03/25/2020. FINDINGS  Left Ventricle: Septal and apical akinesis with generalized global hypokinesis Post implant and long pacing run no change in EF. Left ventricular ejection fraction, by estimation, is 25 to 30%. The left ventricle has severely decreased function. The left ventricle demonstrates regional wall motion abnormalities. Left Atrium: Left atrial size was not assessed. Right Atrium: Right atrial size was not assessed. Pericardium: No change in pericardial effusion post implant. Trivial pericardial effusion is present. The pericardial effusion is anterior to the right ventricle and posterior and lateral to the left ventricle. Mitral Valve: The mitral valve is degenerative in appearance. There is mild thickening of the mitral valve leaflet(s). There is mild calcification of the mitral valve leaflet(s). Moderate mitral annular calcification. Trivial mitral valve regurgitation. Tricuspid Valve: The tricuspid valve is not assessed. Aortic Valve: Pre TAVR: Tri leaflet AV with severe calcification and restricted  leaflet motion Mild AR Severe low flow low gradient AS. AVA 1.1 cm2, peak gradient 28 mmHg mean 17 mmHg. Post TAVR: well positioned 26 mm Sapien 3 Ultra valve No PVL. Peak gradient 2 mmHg mean gradient 1 mmHg AVA 3.3 cm2. The aortic valve has been repaired/replaced. Aortic valve regurgitation is not visualized. No aortic stenosis is present. Aortic  valve mean gradient measures 17.0 mmHg. Aortic valve peak gradient measures 27.8 mmHg. Aortic valve area, by VTI measures 1.15 cm. There is a 26 mm Edwards Sapien prosthetic, stented (TAVR) valve present in the aortic position. Procedure Date: 03/25/2020. Pulmonic Valve: The pulmonic valve was not assessed. Aorta: The aortic root is normal in size and structure. IAS/Shunts: The interatrial septum was not assessed.  LEFT VENTRICLE PLAX 2D LVOT diam:     2.20 cm LV SV:         61 LV SV Index:   33 LVOT Area:     3.80 cm  LV Volumes (MOD) LV vol d, MOD A2C: 102.0 ml LV vol d, MOD A4C: 151.0 ml LV vol s, MOD A2C: 83.9 ml LV vol s, MOD A4C: 89.4 ml LV SV MOD A2C:     18.1 ml LV SV MOD A4C:     151.0 ml LV SV MOD BP:      39.6 ml AORTIC VALVE AV Area (Vmax):    1.14 cm AV Area (Vmean):   1.02 cm AV Area (VTI):     1.15 cm AV Vmax:           263.50 cm/s AV Vmean:          192.500 cm/s AV VTI:            0.534 m AV Peak Grad:      27.8 mmHg AV Mean Grad:      17.0 mmHg LVOT Vmax:         79.10 cm/s LVOT Vmean:        51.700 cm/s LVOT VTI:          0.161 m LVOT/AV VTI ratio: 0.30  SHUNTS Systemic VTI:  0.16 m Systemic Diam: 2.20 cm Jenkins Rouge MD Electronically signed by Jenkins Rouge MD Signature Date/Time: 03/25/2020/2:01:10 PM    Final    Structural Heart Procedure  Result Date: 03/25/2020 See surgical note for result.   Cardiac Studies   TAVR OPERATIVE NOTE   Date of Procedure:                03/25/2020  Preoperative Diagnosis:      Severe Aortic Stenosis   Postoperative Diagnosis:    Same   Procedure:        Transcatheter Aortic Valve Replacement - Percutaneous Left Transfemoral Approach             Edwards Sapien 3 Ultra THV (size 26 mm, model # [id number], serial # [id number])              Co-Surgeons:                        Gaye Pollack, MD and [name], MD    Anesthesiologist:                  Laurie Panda, MD  Echocardiographer:              Jenkins Rouge,  MD  Pre-operative Echo Findings: ? Severe aortic stenosis ? Severe left ventricular systolic dysfunction  Post-operative Echo Findings: ?  No paravalvular leak ? Unchanged Severe left ventricular systolic dysfunction  __________________  Echo 03/26/20: complete but pending formal read  Patient Profile     Robert Dixon is a 79 y.o. male with a hx of COPD on home 2-3 home 02, HTN, stage IIIb NSCLC s/p chemo/XRT and currently on immunotherapy, GERD, 1st deg AV block, pericardial effusion and severe AS who presented to [facility] on [facility]20 with increasing dyspnea. He was found to acute acute systolic CHF with  new LV dysfunction and severe LF LG AS. The structural heart team was consulted and he underwent TAVR on 03/25/20.  Assessment & Plan    Severe LF LG AS: s/p successful TAVR with a  mm Edwards Sapien 3 THV via the TF approach on 03/25/20. Post operative echo pending. Groin sites are stable. ECG with sinus tachy and old first deg block but no high grade heart block. Continue Asprin and plavix. Likely home over the weekend after thoracenteses. I will see him next week in the office for close follow up.  Bilateral pleural effusions: he underwent left thoracentesis on 4/24 yielding 1.1L of transudate fluid. I have consulted IR to re-tap both sides. They can only do one side at a time. They will do the opposite side tomorrow. If pleural effusions re accumulate, he may need a pleur-X placed  Acute on chronic systolic CHF: net neg 06T since admission. Weights trending upwards. 151--> 154 lbs. ? accuracy. Now on Po lasix 40 mg daily. Renal function has remained stable. Appears evolemic today. 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 0-16% LICA stenosis. Will start a statin  Sinus tachy: HRs 110 bpm, just sitting on bed. Pt says he is feeling a bit anxious. Labs are stable, mildly  elevated white count. CXR yesterday with pleural effusions but no evidence of infection, UA yesterday negative. 02 sats 96%. Will continue to monitor. Will try starting him on Toprol XL at lowest dose given cardiomyopathy and soft BP  Signed, Angelena Form, PA-C  03/26/2020, 8:57 AM  Pager [phone]  Patient seen, examined. Available data reviewed. Agree with findings, assessment, and plan as outlined by Nell Range, PA-C.  Patient is independently interviewed and examined.  He is alert, oriented, elderly male in no distress.  Lungs are diminished in the bases but otherwise clear.  Heart is tachycardic and regular with no murmur or gallop.  Bilateral groin sites are clear, there is no pretibial edema.  I agree with the plan as outlined above.  The patient will undergo thoracentesis today and tomorrow and possibly be discharged home over the weekend.  He has done very well with respect to TAVR.  We will check a postoperative day #1 echocardiogram today.  I am hopeful that his LV function will slowly improve over time.  He will be started on a low-dose beta-blocker, with cautious initiation as his blood pressure is somewhat soft.  [name], M.D. 03/26/2020 10:55 AM

## 2020-03-26 NOTE — Progress Notes (Addendum)
Physical Therapy Treatment Patient Details Name: Robert Dixon MRN: [id number] DOB: Aug 11, 1941 Today's Date: 03/26/2020    History of Present Illness 79 y/o male with advanced non-small cell lung cancer and aortic stenosis admitted 03/16/20 for acute on chronic repiratory failure with hypoxemia in the setting of a recurrent left sided pleural effusion. On Home oxygen 3 L. Pt with TAVR 03/25/20. Thoracentesis on 03/26/20.     PT Comments    Pt mobilizing fairly well after TAVR. Needs encouragement to mobilize. Seems a little anxious.  RHR - 102  EHR - 112, resting BP 100/55, BP after amb 113/55. Resting SpO2 99% on 3L, SpO2 after amb 98% on 3L.   Follow Up Recommendations  Home health PT;Supervision - Intermittent     Equipment Recommendations  None recommended by PT    Recommendations for Other Services       Precautions / Restrictions Precautions Precautions: Fall    Mobility  Bed Mobility Overal bed mobility: Needs Assistance Bed Mobility: Supine to Sit     Supine to sit: HOB elevated;Supervision     General bed mobility comments: supervision for lines  Transfers Overall transfer level: Needs assistance Equipment used: 4-wheeled walker Transfers: Sit to/from Stand Sit to Stand: Min guard         General transfer comment: Assist for lines  Ambulation/Gait Ambulation/Gait assistance: Min guard Gait Distance (Feet): 140 Feet Assistive device: 4-wheeled walker Gait Pattern/deviations: Step-through pattern;Decreased stride length Gait velocity: decr Gait velocity interpretation: 1.31 - 2.62 ft/sec, indicative of limited community ambulator General Gait Details: Assist for lines/safety. Pt amb on 3L with SpO2 >98%   Stairs             Wheelchair Mobility    Modified Rankin (Stroke Patients Only)       Balance Overall balance assessment: Needs assistance Sitting-balance support: No upper extremity supported;Feet supported Sitting balance-Leahy  Scale: Good     Standing balance support: No upper extremity supported;During functional activity Standing balance-Leahy Scale: Fair                              Cognition Arousal/Alertness: Awake/alert Behavior During Therapy: WFL for tasks assessed/performed Overall Cognitive Status: Within Functional Limits for tasks assessed                                        Exercises      General Comments        Pertinent Vitals/Pain Pain Assessment: No/denies pain    Home Living                      Prior Function            PT Goals (current goals can now be found in the care plan section) Acute Rehab PT Goals Patient Stated Goal: to be able to go home, Progress towards PT goals: Progressing toward goals    Frequency    Min 3X/week      PT Plan Current plan remains appropriate    Co-evaluation              AM-PAC PT "6 Clicks" Mobility   Outcome Measure  Help needed turning from your back to your side while in a flat bed without using bedrails?: None Help needed moving from lying on your back to sitting on the  side of a flat bed without using bedrails?: None Help needed moving to and from a bed to a chair (including a wheelchair)?: A Little Help needed standing up from a chair using your arms (e.g., wheelchair or bedside chair)?: A Little Help needed to walk in hospital room?: A Little Help needed climbing 3-5 steps with a railing? : A Little 6 Click Score: 20    End of Session Equipment Utilized During Treatment: Oxygen;Gait belt Activity Tolerance: Patient tolerated treatment well Patient left: with call bell/phone within reach;in chair;with chair alarm set(sitting EOB) Nurse Communication: Mobility status PT Visit Diagnosis: Unsteadiness on feet (R26.81);Difficulty in walking, not elsewhere classified (R26.2)     Time: 4718-5501 PT Time Calculation (min) (ACUTE ONLY): 29 min  Charges:  $Gait Training: 23-37  mins                     [place] Pager [phone] Office [place] 03/26/2020, 3:16 PM

## 2020-03-27 LAB — CBC
HCT: 30.9 % — ABNORMAL LOW (ref 39.0–52.0)
Hemoglobin: 10 g/dL — ABNORMAL LOW (ref 13.0–17.0)
MCH: 38.3 pg — ABNORMAL HIGH (ref 26.0–34.0)
MCHC: 32.4 g/dL (ref 30.0–36.0)
MCV: 118.4 fL — ABNORMAL HIGH (ref 80.0–100.0)
Platelets: 139 10*3/uL — ABNORMAL LOW (ref 150–400)
RBC: 2.61 MIL/uL — ABNORMAL LOW (ref 4.22–5.81)
RDW: 14.5 % (ref 11.5–15.5)
WBC: 10.2 10*3/uL (ref 4.0–10.5)
nRBC: 0 % (ref 0.0–0.2)

## 2020-03-27 LAB — BASIC METABOLIC PANEL
Anion gap: 8 (ref 5–15)
BUN: 29 mg/dL — ABNORMAL HIGH (ref 8–23)
CO2: 28 mmol/L (ref 22–32)
Calcium: 8.1 mg/dL — ABNORMAL LOW (ref 8.9–10.3)
Chloride: 103 mmol/L (ref 98–111)
Creatinine, Ser: 1.42 mg/dL — ABNORMAL HIGH (ref 0.61–1.24)
GFR calc Af Amer: 54 mL/min — ABNORMAL LOW (ref >60)
GFR calc non Af Amer: 47 mL/min — ABNORMAL LOW (ref >60)
Glucose, Bld: 110 mg/dL — ABNORMAL HIGH (ref 70–99)
Potassium: 3.9 mmol/L (ref 3.5–5.1)
Sodium: 139 mmol/L (ref 135–145)

## 2020-03-27 MED ORDER — FUROSEMIDE 20 MG PO TABS
20.0000 mg | ORAL_TABLET | Freq: Every day | ORAL | Status: DC
  Administered 2020-03-28: 20 mg via ORAL
  Filled 2020-03-27: qty 1

## 2020-03-27 NOTE — Progress Notes (Signed)
Progress Note  Patient Name: Robert Dixon Date of Encounter: 03/27/2020  Primary Cardiologist:   Minus Breeding, MD   Subjective   He is weak.  Mild groin pain.    Breathing is at baseline.   Inpatient Medications    Scheduled Meds: . aspirin  81 mg Oral Daily  . atorvastatin  40 mg Oral Daily  . Chlorhexidine Gluconate Cloth  6 each Topical Daily  . clopidogrel  75 mg Oral Q breakfast  . docusate sodium  100 mg Oral BID  . fluticasone furoate-vilanterol  1 puff Inhalation Daily   And  . umeclidinium bromide  1 puff Inhalation Daily  . furosemide  40 mg Oral Daily  . heparin injection (subcutaneous)  5,000 Units Subcutaneous Q8H  . metoprolol succinate  12.5 mg Oral Daily  . pantoprazole  40 mg Oral BID  . polycarbophil  625 mg Oral BID  . sodium chloride flush  10-40 mL Intracatheter Q12H  . sodium chloride flush  3 mL Intravenous Q12H  . tamsulosin  0.4 mg Oral Daily   Continuous Infusions: . sodium chloride    . sodium chloride    . nitroGLYCERIN    . phenylephrine (NEO-SYNEPHRINE) Adult infusion     PRN Meds: sodium chloride, sodium chloride, acetaminophen **OR** acetaminophen, albuterol, alum & mag hydroxide-simeth, dimenhyDRINATE, guaiFENesin-dextromethorphan, lidocaine (PF), lip balm, morphine injection, ondansetron (ZOFRAN) IV, ondansetron **OR** [DISCONTINUED] ondansetron (ZOFRAN) IV, oxyCODONE, polyethylene glycol, sodium chloride flush, sodium chloride flush, sodium chloride flush, traMADol   Vital Signs    Vitals:   03/26/20 2100 03/27/20 0025 03/27/20 0248 03/27/20 0822  BP:  (!) 95/56 (!) 117/98 108/62  Pulse:  (!) 103 (!) 107   Resp: 20 18 20 18   Temp:  98.5 F (36.9 C) 98.6 F (37 C) 98.2 F (36.8 C)  TempSrc:  Oral Oral Oral  SpO2:  90% 98%   Weight:   68.6 kg   Height:        Intake/Output Summary (Last 24 hours) at 03/27/2020 1024 Last data filed at 03/27/2020 0843 Gross per 24 hour  Intake 610 ml  Output 1725 ml  Net -1115 ml    Filed Weights   03/25/20 0300 03/26/20 0312 03/27/20 0248  Weight: 70.5 kg 70.2 kg 68.6 kg    Telemetry    NSR - Personally Reviewed  Physical Exam   GEN: No  acute distress.   Neck: No  JVD Cardiac: RRR, soft apical systolic murmur, no diastolic murmurs, rubs, or gallops.  Respiratory:    Few coarse crackles at the left base.  GI: Soft, nontender, non-distended, normal bowel sounds  MS:  No edema; No deformity. Neuro:   Nonfocal  Psych: Oriented and appropriate    Labs    Chemistry Recent Labs  Lab 03/25/20 0231 03/25/20 1251 03/25/20 1441 03/26/20 0607 03/27/20 0319  NA 138   < > 136 137 139  K 4.1   < > 4.3 4.2 3.9  CL 99   < > 95* 99 103  CO2 31  --   --  29 28  GLUCOSE 141*   < > 120* 137* 110*  BUN 34*   < > 28* 30* 29*  CREATININE 1.17   < > 1.00 1.17 1.42*  CALCIUM 7.9*  --   --  8.4* 8.1*  GFRNONAA 59*  --   --  59* 47*  GFRAA >60  --   --  >60 54*  ANIONGAP 8  --   --  9 8   < > = values in this interval not displayed.     Hematology Recent Labs  Lab 03/25/20 0231 03/25/20 1251 03/25/20 1441 03/26/20 0607 03/27/20 0319  WBC 6.2  --   --  11.1* 10.2  RBC 2.56*  --   --  2.68* 2.61*  HGB 10.3*   < > 9.5* 10.4* 10.0*  HCT 31.0*   < > 28.0* 31.8* 30.9*  MCV 121.1*  --   --  118.7* 118.4*  MCH 40.2*  --   --  38.8* 38.3*  MCHC 33.2  --   --  32.7 32.4  RDW 14.5  --   --  14.4 14.5  PLT 161  --   --  149* 139*   < > = values in this interval not displayed.    Cardiac EnzymesNo results for input(s): TROPONINI in the last 168 hours. No results for input(s): TROPIPOC in the last 168 hours.   BNP No results for input(s): BNP, PROBNP in the last 168 hours.   DDimer No results for input(s): DDIMER in the last 168 hours.   Radiology    DG Chest 1 View  Result Date: 03/26/2020 CLINICAL DATA:  Status post left thoracentesis. EXAM: CHEST  1 VIEW COMPARISON:  March 25, 2020. FINDINGS: Stable cardiomediastinal silhouette. No pneumothorax is  noted. Minimal right basilar subsegmental atelectasis is noted. Left pleural effusion is significantly smaller status post thoracentesis. Left basilar atelectasis or infiltrate is noted. Right internal jugular Port-A-Cath is unchanged. Bony thorax is unremarkable. IMPRESSION: 1. Left pleural effusion is significantly smaller status post thoracentesis. Left basilar atelectasis or infiltrate is noted. 2. No pneumothorax is noted. Electronically Signed   By: Marijo Conception M.D.   On: 03/26/2020 12:26   DG Chest Port 1 View  Result Date: 03/25/2020 CLINICAL DATA:  Encounter for status post TAVR. EXAM: PORTABLE CHEST 1 VIEW COMPARISON:  Chest radiograph yesterday. Chest CT 03/22/2020 FINDINGS: Post TAVR. Stable mild cardiomegaly. Unchanged mediastinal contours. Aortic atherosclerosis. Right chest port remains in place with tip in the SVC. Moderate left pleural effusion which is partially loculated, similar to slightly improved from yesterday. Small right pleural effusion which was better appreciated on lateral view yesterday. No pneumothorax or pulmonary edema. Apical predominant emphysema. IMPRESSION: 1. Post TAVR. Stable mild cardiomegaly. 2. Moderate left pleural effusion which is similar to slightly improved from yesterday. 3. Small right pleural effusion, better appreciated on prior imaging, but grossly similar. Aortic Atherosclerosis (ICD10-I70.0) and Emphysema (ICD10-J43.9). Electronically Signed   By: Keith Rake M.D.   On: 03/25/2020 16:38   ECHOCARDIOGRAM COMPLETE  Result Date: 03/26/2020    ECHOCARDIOGRAM REPORT   Patient Name:   Robert Dixon Date of Exam: 03/26/2020 Medical Rec #:  [id number]      Height:       68.0 in Accession #:    [id number]     Weight:       154.8 lb Date of Birth:  12-Jun-1941      BSA:          1.833 m Patient Age:    79 years       BP:           113/99 mmHg Patient Gender: M              HR:           103 bpm. Exam Location:  Inpatient Procedure: 2D Echo, Color Doppler  and Cardiac Doppler Indications:  Post-TAVR Evaluation  History:        Patient has prior history of Echocardiogram examinations, most                 recent 03/25/2020. COPD; Risk Factors:Hypertension. 19mm Edward                 Sapien 3 Ultra TAVR placed 03/25/20.  Sonographer:    Raquel Sarna Senior RDCS Referring Phys: [id number] [place]  1. Diffuse hypokinesis with septal and apical akinesis. Left ventricular ejection fraction, by estimation, is 30 to 35%. The left ventricle has moderately decreased function. The left ventricle demonstrates regional wall motion abnormalities (see scoring diagram/findings for description). The left ventricular internal cavity size was mildly dilated. There is mild left ventricular hypertrophy. Left ventricular diastolic parameters are indeterminate.  2. Right ventricular systolic function is normal. The right ventricular size is normal.  3. Small pericardial effusion seen most prominantly behind RA no change from pre implant.  4. The mitral valve is degenerative. Trivial mitral valve regurgitation.  5. Post TAVR with 26 mm Sapien 3 Ultra with no PVL. Meand gradient 7 peak 13 mmHg and AVA 2.6 cm2. Although gradients higher than implant I suspect implant gradients too low. Valve looks excellent. The aortic valve is tricuspid. Aortic valve regurgitation is not visualized. FINDINGS  Left Ventricle: Diffuse hypokinesis with septal and apical akinesis. Left ventricular ejection fraction, by estimation, is 30 to 35%. The left ventricle has moderately decreased function. The left ventricle demonstrates regional wall motion abnormalities. The left ventricular internal cavity size was mildly dilated. There is mild left ventricular hypertrophy. Left ventricular diastolic parameters are indeterminate. Right Ventricle: The right ventricular size is normal. Right vetricular wall thickness was not assessed. Right ventricular systolic function is normal. Left Atrium: Left atrial  size was normal in size. Right Atrium: Right atrial size was normal in size. Pericardium: Small pericardial effusion seen most prominantly behind RA no change from pre implant. A small pericardial effusion is present. Mitral Valve: The mitral valve is degenerative in appearance. There is moderate thickening of the mitral valve leaflet(s). There is moderate calcification of the mitral valve leaflet(s). Severe mitral annular calcification. Trivial mitral valve regurgitation. MV peak gradient, 12.7 mmHg. The mean mitral valve gradient is 7.0 mmHg. Tricuspid Valve: The tricuspid valve is normal in structure. Tricuspid valve regurgitation is mild. Aortic Valve: Post TAVR with 26 mm Sapien 3 Ultra with no PVL. Meand gradient 7 peak 13 mmHg and AVA 2.6 cm2. Although gradients higher than implant I suspect implant gradients too low. Valve looks excellent. The aortic valve is tricuspid. Aortic valve regurgitation is not visualized. Aortic valve mean gradient measures 7.0 mmHg. Aortic valve peak gradient measures 13.1 mmHg. Aortic valve area, by VTI measures 2.56 cm. Pulmonic Valve: The pulmonic valve was normal in structure. Pulmonic valve regurgitation is mild. Aorta: The aortic root is normal in size and structure. IAS/Shunts: No atrial level shunt detected by color flow Doppler.  LEFT VENTRICLE PLAX 2D LVIDd:         4.20 cm      Diastology LVIDs:         3.70 cm      LV e' lateral: 2.72 cm/s LV PW:         1.40 cm      LV e' medial:  2.72 cm/s LV IVS:        1.50 cm LVOT diam:     2.20 cm LV SV:  73 LV SV Index:   40 LVOT Area:     3.80 cm  LV Volumes (MOD) LV vol d, MOD A2C: 99.5 ml LV vol d, MOD A4C: 122.0 ml LV vol s, MOD A2C: 64.5 ml LV vol s, MOD A4C: 81.7 ml LV SV MOD A2C:     35.0 ml LV SV MOD A4C:     122.0 ml LV SV MOD BP:      42.7 ml RIGHT VENTRICLE RV S prime:     12.40 cm/s TAPSE (M-mode): 1.8 cm LEFT ATRIUM             Index       RIGHT ATRIUM           Index LA diam:        3.70 cm 2.02 cm/m  RA  Area:     17.40 cm LA Vol (A2C):   66.6 ml 36.34 ml/m RA Volume:   47.90 ml  26.14 ml/m LA Vol (A4C):   45.3 ml 24.72 ml/m LA Biplane Vol: 57.3 ml 31.26 ml/m  AORTIC VALVE AV Area (Vmax):    2.63 cm AV Area (Vmean):   2.80 cm AV Area (VTI):     2.56 cm AV Vmax:           181.00 cm/s AV Vmean:          124.000 cm/s AV VTI:            0.285 m AV Peak Grad:      13.1 mmHg AV Mean Grad:      7.0 mmHg LVOT Vmax:         125.00 cm/s LVOT Vmean:        91.200 cm/s LVOT VTI:          0.192 m LVOT/AV VTI ratio: 0.67  AORTA Ao Asc diam: 3.50 cm MITRAL VALVE MV Peak grad: 12.7 mmHg  SHUNTS MV Mean grad: 7.0 mmHg   Systemic VTI:  0.19 m MV Vmax:      1.78 m/s   Systemic Diam: 2.20 cm MV Vmean:     129.0 cm/s Jenkins Rouge MD Electronically signed by Jenkins Rouge MD Signature Date/Time: 03/26/2020/4:40:43 PM    Final    ECHOCARDIOGRAM LIMITED  Result Date: 03/25/2020    ECHOCARDIOGRAM LIMITED REPORT   Patient Name:   REGINOLD BEALE Date of Exam: 03/25/2020 Medical Rec #:  [id number]      Height:       68.0 in Accession #:    [id number]     Weight:       155.4 lb Date of Birth:  1940-12-10      BSA:          1.836 m Patient Age:    89 years       BP:           99/62 mmHg Patient Gender: M              HR:           92 bpm. Exam Location:  Inpatient Procedure: Limited Echo and Limited Color Doppler Indications:     Aortic Stenosis 424.1 / 132.0  History:         Patient has prior history of Echocardiogram examinations, most                  recent 03/16/2020. CHF, COPD, Signs/Symptoms:Shortness of  Breath; Risk Factors:Hypertension and Former Smoker. GERD. Lung                  Cancer.                  Aortic Valve: 26 mm Edwards Sapien prosthetic, stented (TAVR)                  valve is present in the aortic position. Procedure Date:                  03/25/2020.  Sonographer:     Darlina Sicilian RDCS Referring Phys:  [id number] Woodfin Ganja THOMPSON Diagnosing Phys: Jenkins Rouge MD IMPRESSIONS  1. Septal and  apical akinesis with generalized global hypokinesis Post implant and long pacing run no change in EF . Left ventricular ejection fraction, by estimation, is 25 to 30%. The left ventricle has severely decreased function. The left ventricle demonstrates regional wall motion abnormalities (see scoring diagram/findings for description).  2. No change in pericardial effusion post implant . The pericardial effusion is anterior to the right ventricle and posterior and lateral to the left ventricle.  3. The mitral valve is degenerative. Trivial mitral valve regurgitation.  4. Pre TAVR: Tri leaflet AV with severe calcification and restricted leaflet motion Mild AR Severe low flow low gradient AS. AVA 1.1 cm2, peak gradient 28 mmHg mean 17 mmHg.         Post TAVR: well positioned 26 mm Sapien 3 Ultra valve No PVL. Peak gradient 2 mmHg mean gradient 1 mmHg AVA 3.3 cm2. The aortic valve has been repaired/replaced. Aortic valve regurgitation is not visualized. No aortic stenosis is present. There is a 26 mm Edwards Sapien prosthetic (TAVR) valve present in the aortic position. Procedure Date: 03/25/2020. FINDINGS  Left Ventricle: Septal and apical akinesis with generalized global hypokinesis Post implant and long pacing run no change in EF. Left ventricular ejection fraction, by estimation, is 25 to 30%. The left ventricle has severely decreased function. The left ventricle demonstrates regional wall motion abnormalities. Left Atrium: Left atrial size was not assessed. Right Atrium: Right atrial size was not assessed. Pericardium: No change in pericardial effusion post implant. Trivial pericardial effusion is present. The pericardial effusion is anterior to the right ventricle and posterior and lateral to the left ventricle. Mitral Valve: The mitral valve is degenerative in appearance. There is mild thickening of the mitral valve leaflet(s). There is mild calcification of the mitral valve leaflet(s). Moderate mitral annular  calcification. Trivial mitral valve regurgitation. Tricuspid Valve: The tricuspid valve is not assessed. Aortic Valve: Pre TAVR: Tri leaflet AV with severe calcification and restricted leaflet motion Mild AR Severe low flow low gradient AS. AVA 1.1 cm2, peak gradient 28 mmHg mean 17 mmHg. Post TAVR: well positioned 26 mm Sapien 3 Ultra valve No PVL. Peak gradient 2 mmHg mean gradient 1 mmHg AVA 3.3 cm2. The aortic valve has been repaired/replaced. Aortic valve regurgitation is not visualized. No aortic stenosis is present. Aortic valve mean gradient measures 17.0 mmHg. Aortic valve peak gradient measures 27.8 mmHg. Aortic valve area, by VTI measures 1.15 cm. There is a 26 mm Edwards Sapien prosthetic, stented (TAVR) valve present in the aortic position. Procedure Date: 03/25/2020. Pulmonic Valve: The pulmonic valve was not assessed. Aorta: The aortic root is normal in size and structure. IAS/Shunts: The interatrial septum was not assessed.  LEFT VENTRICLE PLAX 2D LVOT diam:     2.20 cm LV SV:  61 LV SV Index:   33 LVOT Area:     3.80 cm  LV Volumes (MOD) LV vol d, MOD A2C: 102.0 ml LV vol d, MOD A4C: 151.0 ml LV vol s, MOD A2C: 83.9 ml LV vol s, MOD A4C: 89.4 ml LV SV MOD A2C:     18.1 ml LV SV MOD A4C:     151.0 ml LV SV MOD BP:      39.6 ml AORTIC VALVE AV Area (Vmax):    1.14 cm AV Area (Vmean):   1.02 cm AV Area (VTI):     1.15 cm AV Vmax:           263.50 cm/s AV Vmean:          192.500 cm/s AV VTI:            0.534 m AV Peak Grad:      27.8 mmHg AV Mean Grad:      17.0 mmHg LVOT Vmax:         79.10 cm/s LVOT Vmean:        51.700 cm/s LVOT VTI:          0.161 m LVOT/AV VTI ratio: 0.30  SHUNTS Systemic VTI:  0.16 m Systemic Diam: 2.20 cm Jenkins Rouge MD Electronically signed by Jenkins Rouge MD Signature Date/Time: 03/25/2020/2:01:10 PM    Final    Structural Heart Procedure  Result Date: 03/25/2020 See surgical note for result.  IR THORACENTESIS ASP PLEURAL SPACE W/IMG GUIDE  Result Date:  03/26/2020 INDICATION: Patient with a history of stage IIIb non-small cell lung cancer and recent TAVR presents to IR with left pleural effusion. IR asked to perform a therapeutic thoracentesis and send specimens to the lab. EXAM: ULTRASOUND GUIDED LEFT THORACENTESIS MEDICATIONS: 1% lidocaine, 10 ml COMPLICATIONS: None immediate. PROCEDURE: An ultrasound guided thoracentesis was thoroughly discussed with the patient and questions answered. The benefits, risks, alternatives and complications were also discussed. The patient understands and wishes to proceed with the procedure. Written consent was obtained. Ultrasound was performed to localize and mark an adequate pocket of fluid in the left chest. The area was then prepped and draped in the normal sterile fashion. 1% Lidocaine was used for local anesthesia. Under ultrasound guidance a 6 Fr Safe-T-Centesis catheter was introduced. Thoracentesis was performed. The catheter was removed and a dressing applied. FINDINGS: A total of approximately 750 ml of clear yellow fluid was removed. Samples were sent to the laboratory as requested by the clinical team. IMPRESSION: Successful ultrasound guided left thoracentesis yielding 750 ml of pleural fluid. Read by: Soyla Dryer, NP Electronically Signed   By: Aletta Edouard M.D.   On: 03/26/2020 12:40    Cardiac Studies    Echo: 4/20 IMPRESSIONS   1. Hypokinesis of the distal anteroseptal wall and apex; overall moderate  to severe LV dysfunction; calcified aortic valve with probable severe AS  (mean gradient 31 mmHg; AVA 1 cm2) and mild AI; mild MR; mild LAE; small  pericardial effusion with RA  inversion.  2. Left ventricular ejection fraction, by estimation, is 30 to 35%. The  left ventricle has moderate to severely decreased function. The left  ventricle has no regional wall motion abnormalities. The left ventricular  internal cavity size was mildly  dilated. There is mild left ventricular hypertrophy.  Left ventricular  diastolic parameters are indeterminate. Elevated left atrial pressure.  3. Right ventricular systolic function is normal. The right ventricular  size is normal. There is mildly elevated pulmonary artery  systolic  pressure.  4. Left atrial size was mildly dilated.  5. The mitral valve is normal in structure. Mild mitral valve  regurgitation. No evidence of mitral stenosis.  6. The aortic valve has an indeterminant number of cusps. Aortic valve  regurgitation is mild. No aortic stenosis is present.    Echo 4/29 1. Diffuse hypokinesis with septal and apical akinesis. Left ventricular  ejection fraction, by estimation, is 30 to 35%. The left ventricle has  moderately decreased function. The left ventricle demonstrates regional  wall motion abnormalities (see  scoring diagram/findings for description). The left ventricular internal  cavity size was mildly dilated. There is mild left ventricular  hypertrophy. Left ventricular diastolic parameters are indeterminate.  2. Right ventricular systolic function is normal. The right ventricular  size is normal.  3. Small pericardial effusion seen most prominantly behind RA no change  from pre implant.  4. The mitral valve is degenerative. Trivial mitral valve regurgitation.  5. Post TAVR with 26 mm Sapien 3 Ultra with no PVL. Meand gradient 7 peak  13 mmHg and AVA 2.6 cm2. Although gradients higher than implant I suspect  implant gradients too low. Valve looks excellent. The aortic valve is  tricuspid. Aortic valve regurgitation is not visualized.    Cath 1.  Calcific, nonobstructive coronary artery disease as outlined 2.  Preserved cardiac output of 7.56 L/min and cardiac index 4.16 3.  Known severe calcific aortic stenosis (stage D2 low-flow low gradient) with inability to cross the aortic valve with a wire 4.  Intracardiac hemodynamics suggestive of congestive heart failure with pulmonary capillary wedge pressure 31  mmHg, PA pressure 52/29 with a mean of 40  Patient Profile     79 y.o. male  from [place] with severe aortic stenosis, chronic systolic heart failure, lung cancer with favorable prognosis  Assessment & Plan    AS:   Status post TAVR.   Valve looked great on echo yesterday.    He will be seen next week in the office by Dr. Burt Knack.  Continue ASA and Plavix. Very weak.  Ambulate in the hallway today and probable discharge in the AM.    ACUTE ON CHRONIC SYSTOLIC HF:  Added low dose beta blocker yesterday.   Status post thoracentesis x on the left yesterday.  I do not think that he has significant effusion on the right to tap by exam or CXR yesterday.  Post thoracentesis CXR with improved left effusion.  He had 40 mg of Lasix today and I will reduce the dose in the AM and check creat. .      AKI:  Creat is up slightly today.  Follow with BMET in the AM.    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  No change in therapy. Continue with risk reduction.     For questions or updates, please contact [place] Please consult [email or web address] for contact info under Cardiology/STEMI.   Signed, Minus Breeding, MD  03/27/2020, 10:24 AM

## 2020-03-27 NOTE — Progress Notes (Signed)
CARDIAC REHAB PHASE I   PRE:  Rate/Rhythm: 106 ST    BP: sitting 104/71    SaO2: 94 3L  MODE:  Ambulation: 140 ft   POST:  Rate/Rhythm: 111 ST    BP: sitting 88/65     SaO2: 90 3L  Pt tolerated fairly well. Able to get to EOB and stand. Min assist with gait belt for security but pt steady. Used RW and 2L O2 initially. Turned up to 3L during walk as SaO2 89 2L. To recliner. BP lower but asx. Discussed walking at home and restrictions. Gave brochure for CRPII however he is not interested right now. [place], ACSM 03/27/2020 1:46 PM

## 2020-03-28 ENCOUNTER — Encounter (HOSPITAL_COMMUNITY): Payer: Self-pay | Admitting: Internal Medicine

## 2020-03-28 DIAGNOSIS — I5023 Acute on chronic systolic (congestive) heart failure: Secondary | ICD-10-CM

## 2020-03-28 DIAGNOSIS — I779 Disorder of arteries and arterioles, unspecified: Secondary | ICD-10-CM

## 2020-03-28 DIAGNOSIS — Z952 Presence of prosthetic heart valve: Secondary | ICD-10-CM

## 2020-03-28 DIAGNOSIS — I5022 Chronic systolic (congestive) heart failure: Secondary | ICD-10-CM

## 2020-03-28 HISTORY — DX: Disorder of arteries and arterioles, unspecified: I77.9

## 2020-03-28 HISTORY — DX: Chronic systolic (congestive) heart failure: I50.22

## 2020-03-28 LAB — BASIC METABOLIC PANEL
Anion gap: 7 (ref 5–15)
BUN: 25 mg/dL — ABNORMAL HIGH (ref 8–23)
CO2: 27 mmol/L (ref 22–32)
Calcium: 8 mg/dL — ABNORMAL LOW (ref 8.9–10.3)
Chloride: 105 mmol/L (ref 98–111)
Creatinine, Ser: 1.28 mg/dL — ABNORMAL HIGH (ref 0.61–1.24)
GFR calc Af Amer: >60 mL/min (ref >60)
GFR calc non Af Amer: 53 mL/min — ABNORMAL LOW (ref >60)
Glucose, Bld: 96 mg/dL (ref 70–99)
Potassium: 4 mmol/L (ref 3.5–5.1)
Sodium: 139 mmol/L (ref 135–145)

## 2020-03-28 LAB — CBC
HCT: 27.4 % — ABNORMAL LOW (ref 39.0–52.0)
Hemoglobin: 9 g/dL — ABNORMAL LOW (ref 13.0–17.0)
MCH: 39.3 pg — ABNORMAL HIGH (ref 26.0–34.0)
MCHC: 32.8 g/dL (ref 30.0–36.0)
MCV: 119.7 fL — ABNORMAL HIGH (ref 80.0–100.0)
Platelets: 134 10*3/uL — ABNORMAL LOW (ref 150–400)
RBC: 2.29 MIL/uL — ABNORMAL LOW (ref 4.22–5.81)
RDW: 14.8 % (ref 11.5–15.5)
WBC: 8.1 10*3/uL (ref 4.0–10.5)
nRBC: 0 % (ref 0.0–0.2)

## 2020-03-28 MED ORDER — FUROSEMIDE 20 MG PO TABS: 20.0000 mg | ORAL_TABLET | Freq: Every day | ORAL | 3 refills | Status: DC

## 2020-03-28 MED ORDER — ACETAMINOPHEN 325 MG PO TABS: 650.0000 mg | ORAL_TABLET | Freq: Four times a day (QID) | ORAL | Status: AC | PRN

## 2020-03-28 MED ORDER — ASPIRIN 81 MG PO CHEW: 81.0000 mg | CHEWABLE_TABLET | Freq: Every day | ORAL | Status: AC

## 2020-03-28 MED ORDER — ATORVASTATIN CALCIUM 40 MG PO TABS: 40.0000 mg | ORAL_TABLET | Freq: Every day | ORAL | 3 refills | Status: DC

## 2020-03-28 MED ORDER — ALUM & MAG HYDROXIDE-SIMETH 200-200-20 MG/5ML PO SUSP: 15.0000 mL | Freq: Four times a day (QID) | ORAL | 0 refills | Status: AC | PRN

## 2020-03-28 MED ORDER — CLOPIDOGREL BISULFATE 75 MG PO TABS: 75.0000 mg | ORAL_TABLET | Freq: Every day | ORAL | 3 refills | Status: DC

## 2020-03-28 MED ORDER — METOPROLOL SUCCINATE ER 25 MG PO TB24: 12.5000 mg | ORAL_TABLET | Freq: Every day | ORAL | 3 refills | Status: DC

## 2020-03-28 MED ORDER — HEPARIN SOD (PORK) LOCK FLUSH 100 UNIT/ML IV SOLN
500.0000 [IU] | INTRAVENOUS | Status: DC | PRN
  Filled 2020-03-28: qty 5

## 2020-03-28 MED ORDER — GUAIFENESIN-DM 100-10 MG/5ML PO SYRP: 5.0000 mL | ORAL_SOLUTION | ORAL | 0 refills | Status: DC | PRN

## 2020-03-28 NOTE — TOC Transition Note (Signed)
Transition of Care [facility]) - CM/SW Discharge Note   Patient Details  Name: Robert Dixon MRN: [id number] Date of Birth: [birth year]  [facility] CM/SW Contact:  Claudie Leach, RN 03/28/2020, 2:34 PM   Clinical Narrative:    Patient to dc home with [facility] PT/RN.  Discussed with patient's son, who was at bedside.  They have no preference of [place] agency.  They are agreeable to [facility].   Referral accepted by [facility] with Alvis Lemmings.   Final next level of care: [place] Barriers to Discharge: No Barriers Identified   Patient Goals and CMS Choice Patient states their goals for this hospitalization and ongoing recovery are:: to get well CMS Medicare.gov Compare Post Acute Care list provided to:: Patient Represenative (must comment)(son) Choice offered to / list presented to : Adult Children  Discharge Placement                       Discharge Plan and Services   Discharge Planning Services: CM Consult Post Acute Care Choice: Skilled Nursing Facility                    HH Arranged: PT, RN [facility] Agency: [place] Date [facility] Agency Contacted: 03/28/20 Time [place]: [place] Representative spoke with at [place]: [place] ([place]) Interventions     Readmission Risk Interventions Readmission Risk Prevention Plan 03/18/2020  Transportation Screening Complete  PCP or Specialist Appt within 3-5 Days Complete  HRI or [place] Complete  Social Work Consult for [place] Planning/Counseling Complete  Palliative Care Screening Not Applicable  Medication Review Press photographer) Complete  Some recent data might be hidden

## 2020-03-28 NOTE — Progress Notes (Signed)
Discharge instructions given to patient. IV removed, clean and intact. Portacath deaccessed by IV team. Medications and wound care reviewed. All questions answered. Pt escorted home by son.  Arletta Bale, RN

## 2020-03-28 NOTE — Hospital Course (Signed)
Echocardiogram 03/16/2020 Anteroseptal and apical HK, EF 30-35, probable severe aortic stenosis (mean gradient 31 mmHg; AVA 1 cm), mild AI, mild MR, mild LAE, small pericardial effusion, RVSP 32.8  Cardiac catheterization 03/19/2020 LAD mild diffuse disease RI mild diffuse disease LCx mild diffuse disease RCA mild diffuse disease 1.  Calcific, nonobstructive coronary artery disease as outlined 2.  Preserved cardiac output of 7.56 L/min and cardiac index 4.16 3.  Known severe calcific aortic stenosis (stage D2 low-flow low gradient) with inability to cross the aortic valve with a wire 4.  Intracardiac hemodynamics suggestive of congestive heart failure with pulmonary capillary wedge pressure 31 mmHg, PA pressure 52/29 with a mean of 40  Carotid US 03/22/2020 R 100%; L 1-39%  Limited echocardiogram 03/25/2020  1. Septal and apical akinesis with generalized global hypokinesis Post  implant and long pacing run no change in EF . Left ventricular ejection  fraction, by estimation, is 25 to 30%. The left ventricle has severely  decreased function. The left ventricle  demonstrates regional wall motion abnormalities (see scoring  diagram/findings for description).   2. No change in pericardial effusion post implant . The pericardial  effusion is anterior to the right ventricle and posterior and lateral to  the left ventricle.   3. The mitral valve is degenerative. Trivial mitral valve regurgitation.   4. Pre TAVR: Tri leaflet AV with severe calcification and restricted  leaflet motion Mild AR Severe low flow low gradient AS. AVA 1.1 cm2, peak  gradient 28 mmHg mean 17 mmHg.      Post TAVR: well positioned 26 mm Sapien 3 Ultra valve No PVL. Peak  gradient 2 mmHg mean gradient 1 mmHg AVA 3.3 cm2. The aortic valve has  been repaired/replaced. Aortic valve regurgitation is not visualized. No  aortic stenosis is present. There is a  26 mm Edwards Sapien prosthetic (TAVR) valve present in the aortic   position. Procedure Date: 03/25/2020.   Echocardiogram 03/26/2020  1. Diffuse hypokinesis with septal and apical akinesis. Left ventricular  ejection fraction, by estimation, is 30 to 35%. The left ventricle has  moderately decreased function. The left ventricle demonstrates regional  wall motion abnormalities (see  scoring diagram/findings for description). The left ventricular internal  cavity size was mildly dilated. There is mild left ventricular  hypertrophy. Left ventricular diastolic parameters are indeterminate.   2. Right ventricular systolic function is normal. The right ventricular  size is normal.   3. Small pericardial effusion seen most prominantly behind RA no change  from pre implant.   4. The mitral valve is degenerative. Trivial mitral valve regurgitation.   5. Post TAVR with 26 mm Sapien 3 Ultra with no PVL. Meand gradient 7 peak  13 mmHg and AVA 2.6 cm2. Although gradients higher than implant I suspect  implant gradients too low. Valve looks excellent. The aortic valve is  tricuspid. Aortic valve  regurgitation is not visualized.   Transcatheter Aortic Valve Replacement 03/25/2020 Procedure:      Transcatheter Aortic Valve Replacement - Percutaneous  Transfemoral Approach             Edwards Sapien 3 Ultra THV (size 26 mm, model # [id number], serial # [id number])              Co-Surgeons:                        Gaye Pollack, MD and [name], MD  Consultants: Internal Medicine Hematology-Oncology [facility] Fanny Bien,  MD) Critical Care Medicine/Pulmonology  Structural Heart Program [name], MD; Gilford Raid, MD)  Acute hypoxic respiratory failure The patient initially presented with O2 sats in the 100s.  He required BiPap.  His respiratory decline was felt to be related to congestive heart failure in the setting of severe aortic stenosis and acute exacerbation of COPD.  He was followed by CCM/pulmonology as well as internal medicine.  CCM/pulmonology  signed off once his respiratory status was stable.  Severe aortic stenosis The patient's aortic stenosis stenosis was felt to be severe in the setting of LV dysfunction.  Discussion was held with the CCM and oncology teams regarding his pulmonary status and his cancer.  His pulmonary disease was felt to be stable and there was no evidence of recurrent CA (cytology neg for malignant cells and CT in January and April without masses).  Therefore, it was felt to be reasonable to proceed with evaluation for aortic valve intervention.  He was evaluated by Dr. Burt Knack for his aortic stenosis.  Cardiac catheterization demonstrated mild, diffuse non-obstructive coronary artery disease.  Dr. Burt Knack felt the patient had severe, stage D2, low-flow low gradient aortic stenosis and recommended proceeding with TAVR.  He was also evaluated by Dr. Cyndia Bent with [facility] who also felt that TAVR was his best option for management.  The patient underwent transcatheter aortic valve replacement on 03/25/2020 with Dr. Burt Knack and Dr. Cyndia Bent via transfemoral approach.  He did well postoperatively.  He did have some tachycardia and was placed on beta-blocker therapy.  Postop echocardiogram demonstrated EF 30-35.  TAVR was stable with no perivalvular leak and mean gradient 7 mmHg.  Acute systolic CHF The patient was diuresed with IV Lasix and was ultimately transitioned to oral diuretics.  His echocardiogram demonstrated newly depressed LVF with EF 30-35 and probable severe AS with mean gradient 31 mmHg.  The patient's blood pressure was somewhat low which precluded titration of CHF medications.  After undergoing TAVR, he did have evidence of tachycardia and was ultimately placed on metoprolol succinate 12.5 mg daily.  Acute kidney injury The patient did develop worsening creatinine with diuresis.  His oral diuretic dose was adjusted.  Creatinine at discharge***  Left pleural effusion He was noted to have significant left-sided pleural  effusion on admission.  He underwent thoracentesis on 03/16/2020 with removal of 1170 mL.  His effusion was noted to be a transudate based upon pleural fluid to serum albumin ratio  Cytology demonstrated reactive mesothelial cells and no malignant cells.   The patient developed recurrent bilateral pleural effusions.  After undergoing TAVR, he was sent back to interventional radiology for repeat thoracentesis.  He had successful thoracentesis on the left with removal of 750 mL.  The effusion on the right was not significant enough to proceed with thoracentesis.  Non-small cell Lung CA, stage 3b The patient has been previously treated with chemotherapy (carboplatin and paclitaxel) without recurrence.  As noted, cytology on his pleural fluid demonstrated no malignant cells.  Hematology/Oncology saw the patient and felt that his presentation was related to acute congestive heart failure in the setting of valvular heart disease and agreed with intervention on his aortic valve.   Carotid artery disease Carotid Dopplers demonstrated a totally occluded right carotid artery and mild (1-39%) disease in the left internal carotid artery.  Aggressive risk factor modification was continued.  Disposition The patient was evaluated by PT and OT.  Recommendation was to discharge to home with home health physical therapy and intermittent supervision.  He  has scheduled follow-up in 1 week with Dr. Percival Spanish on 04/01/2020.  He also has planned follow-up with Nell Range, PA-C in the structural heart clinic on 04/21/2020.  He will have his follow-up echocardiogram at that visit as well.

## 2020-03-28 NOTE — Discharge Summary (Signed)
Discharge Summary    Patient ID: Robert Dixon MRN: [id number]; DOB: Jan 06, 1941  Admit date: 03/15/2020 Discharge date: 03/28/2020  Primary Care Provider: [name], MD  Primary Cardiologist: Minus Breeding, MD   Primary Electrophysiologist:  None   Structural Heart Clinic:  [name], MD     Discharge Diagnoses    Principal Problem:   Acute on chronic respiratory failure with hypoxia [facility]) Active Problems:   Severe aortic stenosis   Acute on chronic systolic CHF (congestive heart failure) ([place])   Non-small cell carcinoma of left lung, stage 3 (HCC)   Pleural effusion   Pericardial effusion   COPD exacerbation (HCC)   S/P TAVR (transcatheter aortic valve replacement)   Carotid artery disease ([place])   AKI (acute kidney injury) ([place])   Diagnostic Studies/Procedures    Echocardiogram 03/16/2020 Anteroseptal and apical HK, EF 30-35, probable severe aortic stenosis (mean gradient 31 mmHg; AVA 1 cm), mild AI, mild MR, mild LAE, small pericardial effusion, RVSP 32.8  Cardiac catheterization 03/19/2020 LAD mild diffuse disease RI mild diffuse disease LCx mild diffuse disease RCA mild diffuse disease 1.  Calcific, nonobstructive coronary artery disease as outlined 2.  Preserved cardiac output of 7.56 L/min and cardiac index 4.16 3.  Known severe calcific aortic stenosis (stage D2 low-flow low gradient) with inability to cross the aortic valve with a wire 4.  Intracardiac hemodynamics suggestive of congestive heart failure with pulmonary capillary wedge pressure 31 mmHg, PA pressure 52/29 with a mean of 40  Carotid US 03/22/2020 R 100%; L 1-39%  Limited echocardiogram 03/25/2020  1. Septal and apical akinesis with generalized global hypokinesis Post  implant and long pacing run no change in EF . Left ventricular ejection  fraction, by estimation, is 25 to 30%. The left ventricle has severely  decreased function. The left ventricle  demonstrates regional wall motion  abnormalities (see scoring  diagram/findings for description).   2. No change in pericardial effusion post implant . The pericardial  effusion is anterior to the right ventricle and posterior and lateral to  the left ventricle.   3. The mitral valve is degenerative. Trivial mitral valve regurgitation.   4. Pre TAVR: Tri leaflet AV with severe calcification and restricted  leaflet motion Mild AR Severe low flow low gradient AS. AVA 1.1 cm2, peak  gradient 28 mmHg mean 17 mmHg.      Post TAVR: well positioned 26 mm Sapien 3 Ultra valve No PVL. Peak  gradient 2 mmHg mean gradient 1 mmHg AVA 3.3 cm2. The aortic valve has  been repaired/replaced. Aortic valve regurgitation is not visualized. No  aortic stenosis is present. There is a  26 mm Edwards Sapien prosthetic (TAVR) valve present in the aortic  position. Procedure Date: 03/25/2020.   Echocardiogram 03/26/2020  1. Diffuse hypokinesis with septal and apical akinesis. Left ventricular  ejection fraction, by estimation, is 30 to 35%. The left ventricle has  moderately decreased function. The left ventricle demonstrates regional  wall motion abnormalities (see  scoring diagram/findings for description). The left ventricular internal  cavity size was mildly dilated. There is mild left ventricular  hypertrophy. Left ventricular diastolic parameters are indeterminate.   2. Right ventricular systolic function is normal. The right ventricular  size is normal.   3. Small pericardial effusion seen most prominantly behind RA no change  from pre implant.   4. The mitral valve is degenerative. Trivial mitral valve regurgitation.   5. Post TAVR with 26 mm Sapien 3 Ultra with no PVL.  Meand gradient 7 peak  13 mmHg and AVA 2.6 cm2. Although gradients higher than implant I suspect  implant gradients too low. Valve looks excellent. The aortic valve is  tricuspid. Aortic valve  regurgitation is not visualized.   Transcatheter Aortic Valve Replacement  03/25/2020 Procedure:       Transcatheter Aortic Valve Replacement - Percutaneous  Transfemoral Approach             Edwards Sapien 3 Ultra THV (size 26 mm, model # [id number], serial # [id number])              Co-Surgeons:                        Gaye Pollack, MD and [name], MD _____________   History of Present Illness     Robert Dixon is a 79 y.o. male with aortic stenosis, pericardial effusion, non-small cell lung cancer status post chemotherapy, COPD/emphysema (oxygen dependent), prior pleural effusion and hypertension who presented to the hospital with worsening shortness of breath and hypoxia with oxygen saturation in the 70% range.  He was initially treated with steroids, antibiotics and IV Lasix as well as BiPAP and admitted for further evaluation and management.  Hospital Course     Consultants: Internal Medicine Hematology-Oncology [facility] Fanny Bien, MD) Critical Care Medicine/Pulmonology  Structural Heart Program [name], MD; Gilford Raid, MD)   Acute hypoxic respiratory failure The patient initially presented with O2 sats in the 9s.  He required BiPap.  His respiratory decline was felt to be related to congestive heart failure in the setting of severe aortic stenosis and acute exacerbation of COPD.  He was followed by CCM/pulmonology as well as internal medicine.  CCM/pulmonology signed off once his respiratory status was stable.  Severe aortic stenosis The patient's aortic stenosis stenosis was felt to be severe in the setting of LV dysfunction.  Discussion was held with the CCM and oncology teams regarding his pulmonary status and his cancer.  His pulmonary disease was felt to be stable and there was no evidence of recurrent CA (cytology neg for malignant cells and CT in January and April without masses).  Therefore, it was felt to be reasonable to proceed with evaluation for aortic valve intervention.  He was evaluated by Dr. Burt Knack for his aortic stenosis.   Cardiac catheterization demonstrated mild, diffuse non-obstructive coronary artery disease.  Dr. Burt Knack felt the patient had severe, stage D2, low-flow low gradient aortic stenosis and recommended proceeding with TAVR.  He was also evaluated by Dr. Cyndia Bent with [facility] who also felt that TAVR was his best option for management.  The patient underwent transcatheter aortic valve replacement on 03/25/2020 with Dr. Burt Knack and Dr. Cyndia Bent via transfemoral approach.  He did well postoperatively.  He did have some tachycardia and was placed on beta-blocker therapy.  Postop echocardiogram demonstrated EF 30-35.  TAVR was stable with no perivalvular leak and mean gradient 7 mmHg.  Acute systolic CHF The patient was diuresed with IV Lasix and was ultimately transitioned to oral diuretics.  His echocardiogram demonstrated newly depressed LVF with EF 30-35 and probable severe AS with mean gradient 31 mmHg.  The patient's blood pressure was somewhat low which precluded titration of CHF medications.  After undergoing TAVR, he did have evidence of tachycardia and was ultimately placed on metoprolol succinate 12.5 mg daily.  Acute kidney injury The patient did develop worsening creatinine with diuresis.  His oral diuretic dose was adjusted.  Creatinine at discharge 1.28.   Left pleural effusion He was noted to have significant left-sided pleural effusion on admission.  He underwent thoracentesis on 03/16/2020 with removal of 1170 mL.  His effusion was noted to be a transudate based upon pleural fluid to serum albumin ratio  Cytology demonstrated reactive mesothelial cells and no malignant cells.   The patient developed recurrent bilateral pleural effusions.  After undergoing TAVR, he was sent back to interventional radiology for repeat thoracentesis.  He had successful thoracentesis on the left with removal of 750 mL.  The effusion on the right was not significant enough to proceed with thoracentesis.  Non-small cell Lung CA,  stage 3b The patient has been previously treated with chemotherapy (carboplatin and paclitaxel) without recurrence.  As noted, cytology on his pleural fluid demonstrated no malignant cells.  Hematology/Oncology saw the patient and felt that his presentation was related to acute congestive heart failure in the setting of valvular heart disease and agreed with intervention on his aortic valve.   Carotid artery disease Carotid Dopplers demonstrated a totally occluded right carotid artery and mild (1-39%) disease in the left internal carotid artery.  Aggressive risk factor modification was continued.  Disposition The patient was evaluated by Dr. Percival Spanish this AM.  He is felt to be stable and improved condition and ready for discharge to home.  The patient was evaluated by PT and OT.  Recommendation was to discharge to home with home health physical therapy and intermittent supervision.  He has scheduled follow-up in 1 week with Dr. Percival Spanish on 04/01/2020.  He also has planned follow-up with Nell Range, PA-C in the structural heart clinic on 04/21/2020.  He will have his follow-up echocardiogram at that visit as well.    Did the patient have an acute coronary syndrome (MI, NSTEMI, STEMI, etc) this admission?:  No                               Did the patient have a percutaneous coronary intervention (stent / angioplasty)?:  No.   _____________  Discharge Vitals Blood pressure 100/61, pulse 98, temperature 98.1 F (36.7 C), temperature source Oral, resp. rate 19, height 5\' 8"  (1.727 m), weight 68.4 kg, SpO2 98 %.  Filed Weights   03/26/20 0312 03/27/20 0248 03/28/20 0423  Weight: 70.2 kg 68.6 kg 68.4 kg    Labs & Radiologic Studies    CBC Recent Labs    03/27/20 0319 03/28/20 0840  WBC 10.2 8.1  HGB 10.0* 9.0*  HCT 30.9* 27.4*  MCV 118.4* 119.7*  PLT 139* 416*   Basic Metabolic Panel Recent Labs    03/26/20 0607 03/26/20 0607 03/27/20 0319 03/28/20 0840  NA 137   < > 139 139  K  4.2   < > 3.9 4.0  CL 99   < > 103 105  CO2 29   < > 28 27  GLUCOSE 137*   < > 110* 96  BUN 30*   < > 29* 25*  CREATININE 1.17   < > 1.42* 1.28*  CALCIUM 8.4*   < > 8.1* 8.0*  MG 2.3  --   --   --    < > = values in this interval not displayed.   High Sensitivity Troponin:   Recent Labs  Lab 03/15/20 1213 03/15/20 1537 03/15/20 1949  TROPONINIHS 85* 127* 134*    _____________  DG Chest 1 View Result Date: 03/26/2020 IMPRESSION:  1. Left pleural effusion is significantly smaller status post thoracentesis. Left basilar atelectasis or infiltrate is noted. 2. No pneumothorax is noted. Electronically Signed   By: Marijo Conception M.D.   On: 03/26/2020 12:26   DG Chest 2 View Result Date: 03/24/2020 IMPRESSION: Bilateral pleural effusions left greater than right but improved when compared with the prior CT examination. Left basilar consolidation stable from the prior CT. Electronically Signed   By: Inez Catalina M.D.   On: 03/24/2020 22:03    CT Chest High Resolution Result Date: 03/15/2020 IMPRESSION: 1. There are bilateral, left greater than right moderate pleural effusions and associated atelectasis or consolidation, significantly increased compared to prior examination. 2. Post treatment consolidation and fibrosis of the perihilar and infrahilar left lung, generally similar to prior examination although significantly obscured by enlarged left pleural effusion. 3.  Severe emphysema.  Emphysema (ICD10-J43.9). 4. No evidence of fibrotic interstitial lung disease; the dependent bilateral lungs are not adequately assessed given the presence of pleural effusions. 5.  Coronary artery disease. 6. Dense aortic valve calcifications. Correlate with echocardiographic findings of valve function. 7.  Aortic Atherosclerosis (ICD10-I70.0). 8. Small pericardial effusion, unchanged compared to prior examination. Electronically Signed   By: Eddie Candle M.D.   On: 03/15/2020 18:56   CT CORONARY MORPH W/CTA COR  W/SCORE W/CA W/CM &/OR WO/CM Addendum Date: 03/22/2020   IMPRESSION: 1. Trileaflet aortic valve with severely thickened and calcified leaflets and no calcifications extending into the LVOT. Aortic valve calcium score is 3216 consistent with severe aortic stenosis. Annular measurements suitable for delivery of a 26 mm Edwards-SAPIEN 3 Ultra valve. 2. Sufficient coronary to annulus distance. 3. Optimum Fluoroscopic Angle for Delivery: LAO 12 CAU 13. 4. No thrombus in the left atrial appendage. Electronically Signed   By: Ena Dawley   On: 03/22/2020 19:51  Result Date: 03/22/2020 EXAM: OVER-READ INTERPRETATION  CT CHEST  IMPRESSION: Please see the separate concurrent chest CT angiogram report for details. Electronically Signed: By: Ilona Sorrel M.D. On: 03/22/2020 11:40   DG Chest Port 1 View Result Date: 03/25/2020 IMPRESSION: 1. Post TAVR. Stable mild cardiomegaly. 2. Moderate left pleural effusion which is similar to slightly improved from yesterday. 3. Small right pleural effusion, better appreciated on prior imaging, but grossly similar. Aortic Atherosclerosis (ICD10-I70.0) and Emphysema (ICD10-J43.9). Electronically Signed   By: Keith Rake M.D.   On: 03/25/2020 16:38   DG Chest Port 1 View Result Date: 03/16/2020 IMPRESSION: 1.  No evidence of pneumothorax post thoracentesis. 2. Stable bilateral interstitial prominence and bibasilar atelectasis. Stable small right pleural effusion. Electronically Signed   By: Marcello Moores  Register   On: 03/16/2020 16:13   DG Chest Port 1 View Result Date: 03/15/2020 IMPRESSION: New interstitial pulmonary edema with increased moderate left and new small right pleural effusions. Electronically Signed   By: Titus Dubin M.D.   On: 03/15/2020 12:51   CT ANGIO CHEST AORTA W/CM & OR WO/CM Result Date: 03/22/2020  IMPRESSION: 1. Vascular findings and measurements pertinent to potential TAVR procedure, as detailed. 2. Diffuse thickening and coarse calcification  of the aortic valve, compatible with the reported history of severe aortic stenosis. 3. Stable postradiation change in the lower left perihilar lung. No evidence of local tumor recurrence. No findings of metastatic disease. 4. Stable small to moderate dependent bilateral pleural effusions. 5. Mild cardiomegaly. Small pericardial effusion, similar. Two-vessel coronary atherosclerosis. 6. Marked sigmoid diverticulosis. 7. Mildly enlarged prostate. 8. Aortic Atherosclerosis (ICD10-I70.0) and Emphysema (ICD10-J43.9). Electronically Signed  By: Ilona Sorrel M.D.   On: 03/22/2020 14:03    Structural Heart Procedure Result Date: 03/25/2020 See surgical note for result.  CT Angio Abd/Pel w/ and/or w/o Result Date: 03/22/2020 IMPRESSION: 1. Vascular findings and measurements pertinent to potential TAVR procedure, as detailed. 2. Diffuse thickening and coarse calcification of the aortic valve, compatible with the reported history of severe aortic stenosis. 3. Stable postradiation change in the lower left perihilar lung. No evidence of local tumor recurrence. No findings of metastatic disease. 4. Stable small to moderate dependent bilateral pleural effusions. 5. Mild cardiomegaly. Small pericardial effusion, similar. Two-vessel coronary atherosclerosis. 6. Marked sigmoid diverticulosis. 7. Mildly enlarged prostate. 8. Aortic Atherosclerosis (ICD10-I70.0) and Emphysema (ICD10-J43.9). Electronically Signed   By: Ilona Sorrel M.D.   On: 03/22/2020 14:03   IR THORACENTESIS ASP PLEURAL SPACE W/IMG GUIDE Result Date: 03/26/2020 IMPRESSION: Successful ultrasound guided left thoracentesis yielding 750 ml of pleural fluid. Read by: Soyla Dryer, NP Electronically Signed   By: Aletta Edouard M.D.   On: 03/26/2020 12:40   US THORACENTESIS ASP PLEURAL SPACE W/IMG GUIDE Result Date: 03/16/2020 IMPRESSION: Successful ultrasound guided therapeutic and diagnostic left-sided thoracentesis yielding 1170 mL of pleural fluid.  Read by Rushie Nyhan NP Electronically Signed   By: Jacqulynn Cadet M.D.   On: 03/16/2020 16:27   Disposition   Pt is being discharged home today in good condition.  Follow-up Plans & Appointments    Follow-up Information    Minus Breeding, MD Follow up on 04/01/2020.   Specialty: Cardiology Why: Please arrive 15 minutes early for your 9am post-hospital cardiology follow-up appointment Contact information: [address] [place] [address] [phone]          Discharge Instructions    ([address]) Call MD:  Anytime you have any of the following symptoms: 1) 3 pound weight gain in 24 hours or 5 pounds in 1 week 2) shortness of breath, with or without a dry hacking cough 3) swelling in the hands, feet or stomach 4) if you have to sleep on extra pillows at night in order to breathe.   Complete by: As directed    Diet - low sodium heart healthy   Complete by: As directed    Discharge wound care:   Complete by: As directed    Call for any swelling, bleeding, bruising or fever.   Driving Restrictions   Complete by: As directed    No driving until cleared by your doctor at follow up.   Increase activity slowly   Complete by: As directed    Lifting restrictions   Complete by: As directed    No lifting over 10 lbs until cleared by your doctor at follow up      Discharge Medications   Allergies as of 03/28/2020   No Known Allergies     Medication List    TAKE these medications   acetaminophen 325 MG tablet Commonly known as: TYLENOL Take 2 tablets (650 mg total) by mouth every 6 (six) hours as needed for mild pain (or Fever >/= 101).   albuterol 108 (90 Base) MCG/ACT inhaler Commonly known as: VENTOLIN HFA Inhale 2 puffs into the lungs every 6 (six) hours as needed for wheezing or shortness of breath.   albuterol (2.5 MG/3ML) 0.083% nebulizer solution Commonly known as: PROVENTIL Take 3 mLs (2.5 mg total) by nebulization every 4  (four) hours.   alum & mag hydroxide-simeth 200-200-20 MG/5ML suspension Commonly known as: MAALOX/MYLANTA Take 15  mLs by mouth every 6 (six) hours as needed for indigestion or heartburn.   ascorbic acid 500 MG tablet Commonly known as: VITAMIN C Take 500 mg by mouth daily.   aspirin 81 MG chewable tablet Chew 1 tablet (81 mg total) by mouth daily. Start taking on: Mar 29, 2020   atorvastatin 40 MG tablet Commonly known as: LIPITOR Take 1 tablet (40 mg total) by mouth daily. Start taking on: Mar 29, 2020   clopidogrel 75 MG tablet Commonly known as: PLAVIX Take 1 tablet (75 mg total) by mouth daily with breakfast. Start taking on: Mar 29, 2020   dimenhyDRINATE 50 MG tablet Commonly known as: DRAMAMINE Take 50 mg by mouth every 8 (eight) hours as needed for dizziness.   docusate sodium 100 MG capsule Commonly known as: COLACE Take 100 mg by mouth daily as needed for mild constipation or moderate constipation.   furosemide 20 MG tablet Commonly known as: LASIX Take 1 tablet (20 mg total) by mouth daily. Start taking on: Mar 29, 2020   guaiFENesin-dextromethorphan 100-10 MG/5ML syrup Commonly known as: ROBITUSSIN DM Take 5 mLs by mouth every 4 (four) hours as needed for cough.   IMFINZI IV Inject into the vein every 14 (fourteen) days.   metoprolol succinate 25 MG 24 hr tablet Commonly known as: TOPROL-XL Take 0.5 tablets (12.5 mg total) by mouth daily. Start taking on: Mar 29, 2020   multivitamin tablet Take 1 tablet by mouth daily.   pantoprazole 40 MG tablet Commonly known as: PROTONIX Take 40 mg by mouth 2 (two) times daily.   polycarbophil 625 MG tablet Commonly known as: FIBERCON Take 625 mg by mouth 2 (two) times daily.   prochlorperazine 10 MG tablet Commonly known as: COMPAZINE TAKE ONE TABLET BY MOUTH EVERY 6 HOURS AS NEEDED FOR NAUSEA OR VOMITING   tamsulosin 0.4 MG Caps capsule Commonly known as: FLOMAX Take 0.4 mg by mouth daily.   traMADol 50  MG tablet Commonly known as: ULTRAM Take 50 mg by mouth 2 (two) times daily as needed.   Trelegy Ellipta 100-62.5-25 MCG/INH Aepb Generic drug: Fluticasone-Umeclidin-Vilant Inhale 1 puff into the lungs daily.   VITAMIN B 12 PO Take 1 capsule by mouth daily. 2500 mcg            Discharge Care Instructions  (From admission, onward)         Start     Ordered   03/28/20 0000  Discharge wound care:    Comments: Call for any swelling, bleeding, bruising or fever.   03/28/20 1054             Outstanding Labs/Studies   Patient will need BMET at follow up on 04/01/2020   Duration of Discharge Encounter   Greater than 60 minutes including physician time.  Signed, Richardson Dopp, PA-C 03/28/2020, 10:55 AM   History and all data above reviewed.  Patient examined.  No acute complaints overnight.  Breathing is at baseline.  Mild pain at the puncture site for the thoracentesis.   I agree with the findings as above.  The patient exam reveals COR:RRR, no murmur  ,  Lungs: Coarse crackles at the left base  ,  Abd: Positive bowel sounds, no rebound no guarding, Ext  Bilateral femoral puncture sites without bleeding or bruising  .  All available labs, radiology testing, previous records reviewed. Agree with documented assessment and plan.   OK to discharge.  Creat is stable.  Follow volume as an out patient.  BP is soft so no titration of meds.  Meds at discharge as on MAR.  Follow up is per the Structural Heart Team   [facility]  10:07 AM  03/28/2020

## 2020-03-29 ENCOUNTER — Inpatient Hospital Stay: Payer: Medicare HMO

## 2020-03-29 ENCOUNTER — Inpatient Hospital Stay: Payer: Medicare HMO | Admitting: Physician Assistant

## 2020-03-29 LAB — CYTOLOGY - NON PAP

## 2020-03-30 DIAGNOSIS — J439 Emphysema, unspecified: Secondary | ICD-10-CM | POA: Diagnosis not present

## 2020-03-30 DIAGNOSIS — I5023 Acute on chronic systolic (congestive) heart failure: Secondary | ICD-10-CM | POA: Diagnosis not present

## 2020-03-30 DIAGNOSIS — I0981 Rheumatic heart failure: Secondary | ICD-10-CM | POA: Diagnosis not present

## 2020-03-30 DIAGNOSIS — J9621 Acute and chronic respiratory failure with hypoxia: Secondary | ICD-10-CM | POA: Diagnosis not present

## 2020-03-30 DIAGNOSIS — I110 Hypertensive heart disease with heart failure: Secondary | ICD-10-CM | POA: Diagnosis not present

## 2020-03-30 DIAGNOSIS — I2510 Atherosclerotic heart disease of native coronary artery without angina pectoris: Secondary | ICD-10-CM | POA: Diagnosis not present

## 2020-03-30 DIAGNOSIS — N179 Acute kidney failure, unspecified: Secondary | ICD-10-CM | POA: Diagnosis not present

## 2020-03-30 DIAGNOSIS — I509 Heart failure, unspecified: Secondary | ICD-10-CM | POA: Diagnosis not present

## 2020-03-30 DIAGNOSIS — Z48812 Encounter for surgical aftercare following surgery on the circulatory system: Secondary | ICD-10-CM | POA: Diagnosis not present

## 2020-03-30 DIAGNOSIS — J8410 Pulmonary fibrosis, unspecified: Secondary | ICD-10-CM | POA: Diagnosis not present

## 2020-03-31 ENCOUNTER — Other Ambulatory Visit: Payer: Self-pay

## 2020-03-31 DIAGNOSIS — N179 Acute kidney failure, unspecified: Secondary | ICD-10-CM | POA: Diagnosis not present

## 2020-03-31 DIAGNOSIS — C3412 Malignant neoplasm of upper lobe, left bronchus or lung: Secondary | ICD-10-CM | POA: Diagnosis not present

## 2020-03-31 DIAGNOSIS — J9611 Chronic respiratory failure with hypoxia: Secondary | ICD-10-CM | POA: Diagnosis not present

## 2020-03-31 DIAGNOSIS — C3492 Malignant neoplasm of unspecified part of left bronchus or lung: Secondary | ICD-10-CM | POA: Diagnosis not present

## 2020-03-31 DIAGNOSIS — J439 Emphysema, unspecified: Secondary | ICD-10-CM | POA: Diagnosis not present

## 2020-03-31 DIAGNOSIS — I0981 Rheumatic heart failure: Secondary | ICD-10-CM | POA: Diagnosis not present

## 2020-03-31 DIAGNOSIS — J9621 Acute and chronic respiratory failure with hypoxia: Secondary | ICD-10-CM | POA: Diagnosis not present

## 2020-03-31 DIAGNOSIS — I5023 Acute on chronic systolic (congestive) heart failure: Secondary | ICD-10-CM | POA: Diagnosis not present

## 2020-03-31 DIAGNOSIS — I110 Hypertensive heart disease with heart failure: Secondary | ICD-10-CM | POA: Diagnosis not present

## 2020-03-31 DIAGNOSIS — J432 Centrilobular emphysema: Secondary | ICD-10-CM | POA: Diagnosis not present

## 2020-03-31 DIAGNOSIS — J8410 Pulmonary fibrosis, unspecified: Secondary | ICD-10-CM | POA: Diagnosis not present

## 2020-03-31 DIAGNOSIS — J90 Pleural effusion, not elsewhere classified: Secondary | ICD-10-CM | POA: Diagnosis not present

## 2020-03-31 DIAGNOSIS — I2510 Atherosclerotic heart disease of native coronary artery without angina pectoris: Secondary | ICD-10-CM | POA: Diagnosis not present

## 2020-03-31 DIAGNOSIS — Z48812 Encounter for surgical aftercare following surgery on the circulatory system: Secondary | ICD-10-CM | POA: Diagnosis not present

## 2020-03-31 DIAGNOSIS — J9600 Acute respiratory failure, unspecified whether with hypoxia or hypercapnia: Secondary | ICD-10-CM | POA: Diagnosis not present

## 2020-03-31 DIAGNOSIS — I6522 Occlusion and stenosis of left carotid artery: Secondary | ICD-10-CM | POA: Insufficient documentation

## 2020-03-31 MED ORDER — ALBUTEROL SULFATE HFA 108 (90 BASE) MCG/ACT IN AERS: 2.0000 | INHALATION_SPRAY | Freq: Four times a day (QID) | RESPIRATORY_TRACT | 5 refills | Status: DC | PRN

## 2020-03-31 NOTE — Progress Notes (Signed)
Cardiology Office Note   Date:  04/01/2020   ID:  Robert Dixon, DOB [birth year], MRN [id number]  PCP:  [name], MD  Cardiologist:   Minus Breeding, MD Referring:  [name], MD    Chief Complaint  Patient presents with  . Aortic Stenosis      History of Present Illness: Robert Dixon is a 79 y.o. male who is referred by [name], MD for evaluation of aortic stenosis.  He has non-small cell lung cancer.  He has been treated with carboplatin and paclitaxel.  He is on Imfinzi.  He has O2 dependent lung disease and emphysema reported.  He has had a thoracentesis that looked to be inflammatory.  I saw him last time because was found to have pericardial effusion and aortic stenosis on an echocardiogram.  He has well-preserved ejection fraction.  The effusion was moderate.  His aortic stenosis was severe.  We were deciding how to mange this and waiting to have more resolution of his cancer.  However, he presented with acute HF and was noted to have a reduced EF.  He had pleural effusions and he underwent thoracentesis.  He had TAVR.   Of note his pericardial effusion was now small.    Since going home he has been weak but has had no acute complaints.  He said no fevers or chills.  He said no cough.  He thinks his breathing is about the same on 2 L of oxygen.  He denies any PND or orthopnea.  He has had a little leg swelling.  He has had no chest pressure, neck or arm discomfort.  He has a little back pain.   Past Medical History:  Diagnosis Date  . Carotid artery disease ([place]) 03/28/2020   Carotid US 02/2020:  R 100%; L 1-39%  . Chronic systolic CHF 03/00/9233   Echo 02/2020: EF 30-35, small pericardial effusion, trivial MR, status post TAVR with no PVL, mean gradient 7 mmHg  . Complication of anesthesia   . COPD (chronic obstructive pulmonary disease) ([place])   . GERD (gastroesophageal reflux disease)   . Hemoptysis 11/20/2018  . History of hiatal hernia   .  Hypertension   . 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 Migraine with visual aura   . Non-small cell lung cancer ([place]) dx'd 11/20/18  . Pneumonia 11/20/2018  . PONV (postoperative nausea and vomiting)   . Pulmonary nodule 11/20/2018  . 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 Skin cancer     Past Surgical History:  Procedure Laterality Date  . COLONOSCOPY W/ POLYPECTOMY    . IR IMAGING GUIDED PORT INSERTION  03/14/2019  . IR THORACENTESIS ASP PLEURAL SPACE W/IMG GUIDE  03/26/2020  . RIGHT HEART CATH AND CORONARY ANGIOGRAPHY N/A 03/19/2020   Procedure: RIGHT HEART CATH AND CORONARY ANGIOGRAPHY;  Surgeon: [name], MD;  Location: [facility];  Service: Cardiovascular;  Laterality: N/A;  . TEE WITHOUT CARDIOVERSION N/A 03/25/2020   Procedure: TRANSESOPHAGEAL ECHOCARDIOGRAM (TEE);  Surgeon: [name], MD;  Location: [facility];  Service: Open Heart Surgery;  Laterality: N/A;  . TONSILLECTOMY    . TRANSCATHETER AORTIC VALVE REPLACEMENT, TRANSFEMORAL Left 03/25/2020   Procedure: TRANSCATHETER AORTIC VALVE REPLACEMENT, LEFT TRANSFEMORAL;  Surgeon: [name], MD;  Location: [facility];  Service: Open Heart Surgery;  Laterality: Left;  Marland Kitchen VASECTOMY    . VIDEO BRONCHOSCOPY WITH ENDOBRONCHIAL ULTRASOUND N/A 12/06/2018   Procedure: VIDEO BRONCHOSCOPY WITH ENDOBRONCHIAL  ULTRASOUND;  Surgeon: Garner Nash, DO;  Location: [facility];  Service: Thoracic;  Laterality: N/A;     Current Outpatient Medications  Medication Sig Dispense Refill  . acetaminophen (TYLENOL) 325 MG tablet Take 2 tablets (650 mg total) by mouth every 6 (six) hours as needed for mild pain (or Fever >/= 101).    Marland Kitchen albuterol (PROVENTIL) (2.5 MG/3ML) 0.083% nebulizer solution Take 3 mLs (2.5 mg total) by nebulization every 4 (four) hours. 360 mL 1  . albuterol (VENTOLIN HFA) 108 (90 Base) MCG/ACT inhaler Inhale 2 puffs into the lungs every 6 (six) hours as needed for wheezing or  shortness of breath. 18 g 5  . alum & mag hydroxide-simeth (MAALOX/MYLANTA) 200-200-20 MG/5ML suspension Take 15 mLs by mouth every 6 (six) hours as needed for indigestion or heartburn. 355 mL 0  . ascorbic acid (VITAMIN C) 500 MG tablet Take 500 mg by mouth daily.    Marland Kitchen aspirin 81 MG chewable tablet Chew 1 tablet (81 mg total) by mouth daily.    Marland Kitchen atorvastatin (LIPITOR) 40 MG tablet Take 1 tablet (40 mg total) by mouth daily. 90 tablet 3  . clopidogrel (PLAVIX) 75 MG tablet Take 1 tablet (75 mg total) by mouth daily with breakfast. 90 tablet 3  . Cyanocobalamin (VITAMIN B 12 PO) Take 1 capsule by mouth daily. 2500 mcg    . dimenhyDRINATE (DRAMAMINE) 50 MG tablet Take 50 mg by mouth every 8 (eight) hours as needed for dizziness.     . docusate sodium (COLACE) 100 MG capsule Take 100 mg by mouth daily as needed for mild constipation or moderate constipation.    [place] (IMFINZI IV) Inject into the vein every 14 (fourteen) days.    . furosemide (LASIX) 20 MG tablet Take 1 tablet (20 mg total) by mouth daily. 90 tablet 3  . guaiFENesin-dextromethorphan (ROBITUSSIN DM) 100-10 MG/5ML syrup Take 5 mLs by mouth every 4 (four) hours as needed for cough. 118 mL 0  . metoprolol succinate (TOPROL-XL) 25 MG 24 hr tablet Take 0.5 tablets (12.5 mg total) by mouth daily. 45 tablet 3  . Multiple Vitamin (MULTIVITAMIN) tablet Take 1 tablet by mouth daily.    . pantoprazole (PROTONIX) 40 MG tablet Take 40 mg by mouth 2 (two) times daily.    . polycarbophil (FIBERCON) 625 MG tablet Take 625 mg by mouth 2 (two) times daily.     . prochlorperazine (COMPAZINE) 10 MG tablet TAKE ONE TABLET BY MOUTH EVERY 6 HOURS AS NEEDED FOR NAUSEA OR VOMITING 30 tablet 0  . tamsulosin (FLOMAX) 0.4 MG CAPS capsule Take 0.4 mg by mouth daily.     . traMADol (ULTRAM) 50 MG tablet Take 50 mg by mouth 2 (two) times daily as needed.    . TRELEGY ELLIPTA 100-62.5-25 MCG/INH AEPB Inhale 1 puff into the lungs daily.     No current  facility-administered medications for this visit.   Facility-Administered Medications Ordered in Other Visits  Medication Dose Route Frequency Provider Last Rate Last Admin  . sodium chloride flush (NS) 0.9 % injection 10 mL  10 mL Intracatheter PRN [name], MD      . sodium chloride flush (NS) 0.9 % injection 10 mL  10 mL Intracatheter PRN [name], MD   10 mL at 05/20/19 1232    Allergies:   Patient has no known allergies.    ROS:  Please see the history of present illness.   Otherwise, review of systems are positive for none.  All other systems are reviewed and negative.    PHYSICAL EXAM: VS:  BP (!) 96/56   Pulse 94   Temp (!) 97.2 F (36.2 C)   Ht 5\' 8"  (1.727 m)   Wt 159 lb 9.6 oz (72.4 kg)   SpO2 98%   BMI 24.27 kg/m  , BMI Body mass index is 24.27 kg/m. GEN:  No distress, frail appearing NECK:  No jugular venous distention at 90 degrees, waveform within normal limits, carotid upstroke brisk and symmetric, no bruits, no thyromegaly LYMPHATICS:  No cervical adenopathy LUNGS:  Clear to auscultation bilaterally BACK:  No CVA tenderness CHEST: Decreased breath sounds with dullness to percussion of the left base about one third up HEART:  S1 and S2 within normal limits, no S3, no S4, no clicks, no rubs, no murmurs ABD:  Positive bowel sounds normal in frequency in pitch, no bruits, no rebound, no guarding, unable to assess midline mass or bruit with the patient seated. EXT:  2 plus pulses throughout, moderate edema, no cyanosis no clubbing SKIN:  No rashes no nodules NEURO:  Cranial nerves II through XII grossly intact, motor grossly intact throughout PSYCH:  Cognitively intact, oriented to person place and time   EKG:  EKG is  ordered today. The ekg ordered is demonstrates sinus rhythm, rate 94axis within normal limits, intervals within normal limits, poor anterior R wave progression.   Recent Labs: 03/09/2020: ALT 12; TSH 2.432 03/16/2020: B  Natriuretic Peptide 910.9 03/26/2020: Magnesium 2.3 03/28/2020: BUN 25; Creatinine, Ser 1.28; Hemoglobin 9.0; Platelets 134; Potassium 4.0; Sodium 139    Lipid Panel    Component Value Date/Time   CHOL 116 03/17/2020 0526   TRIG 70 03/17/2020 0526   HDL 38 (L) 03/17/2020 0526   CHOLHDL 3.1 03/17/2020 0526   VLDL 14 03/17/2020 0526   LDLCALC 64 03/17/2020 0526      Wt Readings from Last 3 Encounters:  04/01/20 159 lb 9.6 oz (72.4 kg)  03/28/20 150 lb 12.7 oz (68.4 kg)  03/09/20 163 lb 4.8 oz (74.1 kg)      Other studies Reviewed: Additional studies/ records that were reviewed today include: Hospital records Review of the above records demonstrates: See elsewhere   ASSESSMENT AND PLAN:  Status post TAVR:  He has an appt on 04/21/2020.  He has an echo planned for that time as well.  No change in therapy at this point.  PERICARDIAL EFFUSION:   This is now small.  We will follow this up over time with repeat echocardiograms.   ACUTE SYSTOLIC HF: We are unable to titrate his meds with his low blood pressure.  He does have continued left-sided pleural effusion by exam and this can be followed with chest x-rays.  He might need further thoracentesis on this side in the future.  HTN: Blood pressures are running low.  However I do think he is tolerating current medications.   CAROTID STENOSIS:  He has an occluded left internal carotid artery and mild disease on the right.  We will follow this with a repeat study in one year.     Current medicines are reviewed at length with the patient today.  The patient does not have concerns regarding medicines.  The following changes have been made:  None  Labs/ tests ordered today include: None  Orders Placed This Encounter  Procedures  . EKG 12-Lead     Disposition:   FU with me in two months.   Signed, Minus Breeding, MD  04/01/2020 9:31 AM    [place]

## 2020-04-01 ENCOUNTER — Other Ambulatory Visit: Payer: Self-pay

## 2020-04-01 ENCOUNTER — Ambulatory Visit (INDEPENDENT_AMBULATORY_CARE_PROVIDER_SITE_OTHER): Payer: Medicare HMO | Admitting: Cardiology

## 2020-04-01 ENCOUNTER — Encounter: Payer: Self-pay | Admitting: Cardiology

## 2020-04-01 VITALS — BP 96/56 | HR 94 | Temp 97.2°F | Ht 68.0 in | Wt 159.6 lb

## 2020-04-01 DIAGNOSIS — Z952 Presence of prosthetic heart valve: Secondary | ICD-10-CM | POA: Diagnosis not present

## 2020-04-01 DIAGNOSIS — I313 Pericardial effusion (noninflammatory): Secondary | ICD-10-CM | POA: Diagnosis not present

## 2020-04-01 DIAGNOSIS — I6522 Occlusion and stenosis of left carotid artery: Secondary | ICD-10-CM | POA: Diagnosis not present

## 2020-04-01 DIAGNOSIS — I10 Essential (primary) hypertension: Secondary | ICD-10-CM

## 2020-04-01 DIAGNOSIS — I3139 Other pericardial effusion (noninflammatory): Secondary | ICD-10-CM

## 2020-04-01 DIAGNOSIS — I5021 Acute systolic (congestive) heart failure: Secondary | ICD-10-CM

## 2020-04-01 NOTE — Patient Instructions (Signed)
Medication Instructions:  NO CHANGES *If you need a refill on your cardiac medications before your next appointment, please call your pharmacy*  Lab Work: NONE ORDERED THIS VISIT  Testing/Procedures: NONE ORDERED THIS VISIT  Follow-Up: At [facility], you and your health needs are our priority.  As part of our continuing mission to provide you with exceptional heart care, we have created designated Provider Care Teams.  These Care Teams include your primary Cardiologist (physician) and Advanced Practice Providers (APPs -  Physician Assistants and Nurse Practitioners) who all work together to provide you with the care you need, when you need it.  Your next appointment:   2 month(s)  The format for your next appointment:   In Person  Provider:   Minus Breeding, MD

## 2020-04-02 DIAGNOSIS — J439 Emphysema, unspecified: Secondary | ICD-10-CM | POA: Diagnosis not present

## 2020-04-02 DIAGNOSIS — I110 Hypertensive heart disease with heart failure: Secondary | ICD-10-CM | POA: Diagnosis not present

## 2020-04-02 DIAGNOSIS — I0981 Rheumatic heart failure: Secondary | ICD-10-CM | POA: Diagnosis not present

## 2020-04-02 DIAGNOSIS — J8410 Pulmonary fibrosis, unspecified: Secondary | ICD-10-CM | POA: Diagnosis not present

## 2020-04-02 DIAGNOSIS — I5023 Acute on chronic systolic (congestive) heart failure: Secondary | ICD-10-CM | POA: Diagnosis not present

## 2020-04-02 DIAGNOSIS — N179 Acute kidney failure, unspecified: Secondary | ICD-10-CM | POA: Diagnosis not present

## 2020-04-02 DIAGNOSIS — Z48812 Encounter for surgical aftercare following surgery on the circulatory system: Secondary | ICD-10-CM | POA: Diagnosis not present

## 2020-04-02 DIAGNOSIS — J9621 Acute and chronic respiratory failure with hypoxia: Secondary | ICD-10-CM | POA: Diagnosis not present

## 2020-04-02 DIAGNOSIS — I2510 Atherosclerotic heart disease of native coronary artery without angina pectoris: Secondary | ICD-10-CM | POA: Diagnosis not present

## 2020-04-07 ENCOUNTER — Telehealth: Payer: Self-pay | Admitting: Cardiology

## 2020-04-07 NOTE — Telephone Encounter (Signed)
Patient was previously scheduled for echo and f/u

## 2020-04-07 NOTE — Progress Notes (Signed)
[place] OFFICE PROGRESS NOTE  [name], MD [place] [address] [place] [address]  DIAGNOSIS: Stage IIIB(T1b, N3, M0) non-small cell lung cancer favoring adenocarcinoma diagnosed in January 2020 and presented with left lower lobe pulmonary nodule in addition to bilateral hilar and subcarinal lymphadenopathy  PRIOR THERAPY:  1) Concurrent chemoradiation with weekly carboplatin for an AUC of 2 and paclitaxel 45 mg/m2. Status post 5 cycles. 2) Consolidation immunotherapy with Imfinzi 10 mg/kg IV every 2 weeks. Last dose 03/09/20.Status post26cycles.  CURRENT THERAPY: Observation  INTERVAL HISTORY: Robert Dixon 79 y.o. male returns to the clinic today for a follow-up visit.  The patient is feeling well today without any concerning complaints. The patient recently completed consolidation immunotherapy with Imfinzi.  He is status post 26 cycles.  He tolerated his treatment well without any concerning adverse side effects.  The patient was recently seen in the emergency room/hospital for heart failure with reduced ejection fraction secondary to his severe aortic stenosis and pleural effusions.  He had a thoracentesis performed which appeared inflammatory.  The patient is status post a TAVR and he is followed by cardiology for this concern.   Today, the patient denies any fever, chills, or night sweats. He lost about 7 lbs since he appointment last month. He is oxygen dependent and reports his baseline shortness of breath with exertion; however, he notes that it is improved from his recently hospitalization.  He denies any chest pain or hemoptysis.  He denies any nausea, vomiting, diarrhea, or constipation.  He denies any headache or visual changes.  He denies any rashes or skin changes.  The patient is recently had a CT scan performed on 4/26.  He is here today for evaluation and to review his scan results.   MEDICAL HISTORY: Past Medical History:  Diagnosis  Date  . Carotid artery disease ([facility]) 03/28/2020   Carotid US 02/2020:  R 100%; L 1-39%  . Chronic systolic CHF 86/57/8469   Echo 02/2020: EF 30-35, small pericardial effusion, trivial MR, status post TAVR with no PVL, mean gradient 7 mmHg  . Complication of anesthesia   . COPD (chronic obstructive pulmonary disease) ([place] [facility])   . GERD (gastroesophageal reflux disease)   . Hemoptysis 11/20/2018  . History of hiatal hernia   . Hypertension   . Legionnaire's disease ([facility]) 2014  . Migraine with visual aura   . Non-small cell lung cancer ([facility]) dx'd 11/20/18  . Pneumonia 11/20/2018  . PONV (postoperative nausea and vomiting)   . Pulmonary nodule 11/20/2018  . 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 Skin cancer     ALLERGIES:  has No Known Allergies.  MEDICATIONS:  Current Outpatient Medications  Medication Sig Dispense Refill  . acetaminophen (TYLENOL) 325 MG tablet Take 2 tablets (650 mg total) by mouth every 6 (six) hours as needed for mild pain (or Fever >/= 101).    Marland Kitchen albuterol (PROVENTIL) (2.5 MG/3ML) 0.083% nebulizer solution Take 3 mLs (2.5 mg total) by nebulization every 4 (four) hours. 360 mL 1  . albuterol (VENTOLIN HFA) 108 (90 Base) MCG/ACT inhaler Inhale 2 puffs into the lungs every 6 (six) hours as needed for wheezing or shortness of breath. 18 g 5  . alum & mag hydroxide-simeth (MAALOX/MYLANTA) 200-200-20 MG/5ML suspension Take 15 mLs by mouth every 6 (six) hours as needed for indigestion or heartburn. 355 mL 0  . ascorbic acid (VITAMIN C) 500 MG tablet Take 500 mg by mouth daily.    Marland Kitchen  aspirin 81 MG chewable tablet Chew 1 tablet (81 mg total) by mouth daily.    Marland Kitchen atorvastatin (LIPITOR) 40 MG tablet Take 1 tablet (40 mg total) by mouth daily. 90 tablet 3  . clopidogrel (PLAVIX) 75 MG tablet Take 1 tablet (75 mg total) by mouth daily with breakfast. 90 tablet 3  . Cyanocobalamin (VITAMIN B 12 PO) Take 1 capsule by mouth daily. 2500 mcg    . dimenhyDRINATE  (DRAMAMINE) 50 MG tablet Take 50 mg by mouth every 8 (eight) hours as needed for dizziness.     . docusate sodium (COLACE) 100 MG capsule Take 100 mg by mouth daily as needed for mild constipation or moderate constipation.    Hunt Oris (IMFINZI IV) Inject into the vein every 14 (fourteen) days.    . furosemide (LASIX) 20 MG tablet Take 1 tablet (20 mg total) by mouth daily. 90 tablet 3  . guaiFENesin-dextromethorphan (ROBITUSSIN DM) 100-10 MG/5ML syrup Take 5 mLs by mouth every 4 (four) hours as needed for cough. 118 mL 0  . metoprolol succinate (TOPROL-XL) 25 MG 24 hr tablet Take 0.5 tablets (12.5 mg total) by mouth daily. 45 tablet 3  . Multiple Vitamin (MULTIVITAMIN) tablet Take 1 tablet by mouth daily.    . pantoprazole (PROTONIX) 40 MG tablet Take 40 mg by mouth 2 (two) times daily.    . polycarbophil (FIBERCON) 625 MG tablet Take 625 mg by mouth 2 (two) times daily.     . prochlorperazine (COMPAZINE) 10 MG tablet TAKE ONE TABLET BY MOUTH EVERY 6 HOURS AS NEEDED FOR NAUSEA OR VOMITING 30 tablet 0  . tamsulosin (FLOMAX) 0.4 MG CAPS capsule Take 0.4 mg by mouth daily.     . traMADol (ULTRAM) 50 MG tablet Take 50 mg by mouth 2 (two) times daily as needed.    . TRELEGY ELLIPTA 100-62.5-25 MCG/INH AEPB Inhale 1 puff into the lungs daily.     No current facility-administered medications for this visit.   Facility-Administered Medications Ordered in Other Visits  Medication Dose Route Frequency Provider Last Rate Last Admin  . sodium chloride flush (NS) 0.9 % injection 10 mL  10 mL Intracatheter PRN [name], MD      . sodium chloride flush (NS) 0.9 % injection 10 mL  10 mL Intracatheter PRN [name], MD   10 mL at 05/20/19 1232  . sodium chloride flush (NS) 0.9 % injection 10 mL  10 mL Intracatheter PRN [name], MD   10 mL at 04/08/20 1120    SURGICAL HISTORY:  Past Surgical History:  Procedure Laterality Date  . COLONOSCOPY W/ POLYPECTOMY    . IR IMAGING GUIDED  PORT INSERTION  03/14/2019  . IR THORACENTESIS ASP PLEURAL SPACE W/IMG GUIDE  03/26/2020  . RIGHT HEART CATH AND CORONARY ANGIOGRAPHY N/A 03/19/2020   Procedure: RIGHT HEART CATH AND CORONARY ANGIOGRAPHY;  Surgeon: [name], MD;  Location: [facility];  Service: Cardiovascular;  Laterality: N/A;  . TEE WITHOUT CARDIOVERSION N/A 03/25/2020   Procedure: TRANSESOPHAGEAL ECHOCARDIOGRAM (TEE);  Surgeon: [name], MD;  Location: [facility];  Service: Open Heart Surgery;  Laterality: N/A;  . TONSILLECTOMY    . TRANSCATHETER AORTIC VALVE REPLACEMENT, TRANSFEMORAL Left 03/25/2020   Procedure: TRANSCATHETER AORTIC VALVE REPLACEMENT, LEFT TRANSFEMORAL;  Surgeon: [name], MD;  Location: [facility];  Service: Open Heart Surgery;  Laterality: Left;  Marland Kitchen VASECTOMY    . VIDEO BRONCHOSCOPY WITH ENDOBRONCHIAL ULTRASOUND N/A 12/06/2018   Procedure: VIDEO  BRONCHOSCOPY WITH ENDOBRONCHIAL ULTRASOUND;  Surgeon: Garner Nash, DO;  Location: [facility];  Service: Thoracic;  Laterality: N/A;    REVIEW OF SYSTEMS:   Review of Systems  Constitutional: Positive for fatigue and weight loss. Negative for appetite change, chills, fever and unexpected weight change.  HENT: Negative for mouth sores, nosebleeds, sore throat and trouble swallowing.   Eyes: Negative for eye problems and icterus.  Respiratory: Positive for shortness of breath with exertion. Negative for cough, hemoptysis, and wheezing.  Cardiovascular: Negative for chest pain and leg swelling.  Gastrointestinal: Negative for abdominal pain, constipation, diarrhea, nausea and vomiting.  Genitourinary: Negative for bladder incontinence, difficulty urinating, dysuria, frequency and hematuria.   Musculoskeletal: Negative for back pain, gait problem, neck pain and neck stiffness.  Skin: Negative for itching and rash.  Neurological: Negative for dizziness, extremity weakness, gait problem, headaches, light-headedness and seizures.   Hematological: Negative for adenopathy. Does not bruise/bleed easily.  Psychiatric/Behavioral: Negative for confusion, depression and sleep disturbance. The patient is not nervous/anxious.     PHYSICAL EXAMINATION:  Blood pressure 103/60, pulse 98, temperature 98.9 F (37.2 C), temperature source Temporal, resp. rate 17, height 5\' 8"  (1.727 m), weight 156 lb 12.8 oz (71.1 kg), SpO2 100 %.  ECOG PERFORMANCE STATUS: 1 - Symptomatic but completely ambulatory  Physical Exam  Constitutional: Oriented to person, place, and time and elderly maleand in no distress. On 3 L of oxygen.  HENT:  Head: Normocephalic and atraumatic.  Mouth/Throat: Oropharynx is clear and moist. No oropharyngeal exudate.  Eyes: Conjunctivae are normal. Right eye exhibits no discharge. Left eye exhibits no discharge. No scleral icterus.  Neck: Normal range of motion. Neck supple.  Cardiovascular: Normal rate, regular rhythm, normal heart sounds and intact distal pulses.   Quiet breath sounds in all lung fields but clear. No respiratory distress. No wheezes. No rales. On 3 L of oxygen Abdominal: Soft. Bowel sounds are normal. Exhibits no distension and no mass. There is no tenderness.  Musculoskeletal: Normal range of motion. Exhibits no edema.  Lymphadenopathy:    No cervical adenopathy.  Neurological: Alert and oriented to person, place, and time. Exhibits normal muscle tone. Examined in the wheelchair.  Skin: Skin is warm and dry. No rash noted. Not diaphoretic. No erythema. No pallor.  Psychiatric: Mood, memory and judgment normal.  Vitals reviewed.  LABORATORY DATA: Lab Results  Component Value Date   WBC 5.6 04/08/2020   HGB 9.4 (L) 04/08/2020   HCT 28.7 (L) 04/08/2020   MCV 118.6 (H) 04/08/2020   PLT 297 04/08/2020      Chemistry      Component Value Date/Time   NA 140 04/08/2020 1120   K 4.4 04/08/2020 1120   CL 107 04/08/2020 1120   CO2 24 04/08/2020 1120   BUN 24 (H) 04/08/2020 1120    CREATININE 1.17 04/08/2020 1120      Component Value Date/Time   CALCIUM 8.5 (L) 04/08/2020 1120   ALKPHOS 91 04/08/2020 1120   AST 15 04/08/2020 1120   ALT 17 04/08/2020 1120   BILITOT 0.5 04/08/2020 1120       RADIOGRAPHIC STUDIES:  DG Chest 1 View  Result Date: 03/26/2020 CLINICAL DATA:  Status post left thoracentesis. EXAM: CHEST  1 VIEW COMPARISON:  March 25, 2020. FINDINGS: Stable cardiomediastinal silhouette. No pneumothorax is noted. Minimal right basilar subsegmental atelectasis is noted. Left pleural effusion is significantly smaller status post thoracentesis. Left basilar atelectasis or infiltrate is noted. Right internal jugular Port-A-Cath is unchanged.  Bony thorax is unremarkable. IMPRESSION: 1. Left pleural effusion is significantly smaller status post thoracentesis. Left basilar atelectasis or infiltrate is noted. 2. No pneumothorax is noted. Electronically Signed   By: Marijo Conception M.D.   On: 03/26/2020 12:26   DG Chest 2 View  Result Date: 03/24/2020 CLINICAL DATA:  Preop evaluation EXAM: CHEST - 2 VIEW COMPARISON:  03/23/2011 FINDINGS: Cardiac shadow remains mildly enlarged. Small right-sided pleural effusion remains posteriorly decreased from the prior exam. Moderate-sized left pleural effusion is noted with left basilar consolidation similar to that seen on recent CT examination. No new focal infiltrate is seen. Right chest wall port is stable. No acute bony abnormality is noted. Chronic compression deformity in the midthoracic spine is again noted. IMPRESSION: Bilateral pleural effusions left greater than right but improved when compared with the prior CT examination. Left basilar consolidation stable from the prior CT. Electronically Signed   By: Inez Catalina M.D.   On: 03/24/2020 22:03   CARDIAC CATHETERIZATION  Result Date: 03/19/2020 1.  Calcific, nonobstructive coronary artery disease as outlined 2.  Preserved cardiac output of 7.56 L/min and cardiac index 4.16  3.  Known severe calcific aortic stenosis (stage D2 low-flow low gradient) with inability to cross the aortic valve with a wire 4.  Intracardiac hemodynamics suggestive of congestive heart failure with pulmonary capillary wedge pressure 31 mmHg, PA pressure 52/29 with a mean of 40 Recommend: IV diuresis and continued treatment of congestive heart failure, formal structural heart consultation for consideration of TAVR.  This will have to be weighed in the context of the patient's known comorbidities.  CT Chest High Resolution  Result Date: 03/15/2020 CLINICAL DATA:  Interstitial lung disease, shortness of breath EXAM: CT CHEST WITHOUT CONTRAST TECHNIQUE: Multidetector CT imaging of the chest was performed following the standard protocol without intravenous contrast. High resolution imaging of the lungs, as well as inspiratory and expiratory imaging, was performed. COMPARISON:  12/01/2019 FINDINGS: Cardiovascular: Aortic atherosclerosis. Dense aortic valve calcifications. Normal heart size. Extensive coronary artery calcifications and/or stents. Small pericardial effusion, unchanged compared to prior examination. Mediastinum/Nodes: No enlarged mediastinal, hilar, or axillary lymph nodes. Thyroid gland, trachea, and esophagus demonstrate no significant findings. Lungs/Pleura: There are bilateral, left greater than right moderate pleural effusions and associated atelectasis or consolidation, significantly increased compared to prior examination. Severe centrilobular emphysema. Post treatment consolidation and fibrosis of the perihilar and infrahilar left lung, generally similar to prior examination although significantly obscured by enlarged left pleural effusion. Upper Abdomen: No acute abnormality. Musculoskeletal: No chest wall mass or suspicious bone lesions identified. Unchanged wedge deformity of the T7 vertebral body. IMPRESSION: 1. There are bilateral, left greater than right moderate pleural effusions and  associated atelectasis or consolidation, significantly increased compared to prior examination. 2. Post treatment consolidation and fibrosis of the perihilar and infrahilar left lung, generally similar to prior examination although significantly obscured by enlarged left pleural effusion. 3.  Severe emphysema.  Emphysema (ICD10-J43.9). 4. No evidence of fibrotic interstitial lung disease; the dependent bilateral lungs are not adequately assessed given the presence of pleural effusions. 5.  Coronary artery disease. 6. Dense aortic valve calcifications. Correlate with echocardiographic findings of valve function. 7.  Aortic Atherosclerosis (ICD10-I70.0). 8. Small pericardial effusion, unchanged compared to prior examination. Electronically Signed   By: Eddie Candle M.D.   On: 03/15/2020 18:56   CT CORONARY MORPH W/CTA COR W/SCORE W/CA W/CM &/OR WO/CM  Addendum Date: 03/22/2020   ADDENDUM REPORT: 03/22/2020 19:51 CLINICAL DATA:  79 year old male  with severe aortic stenosis being evaluated for a TAVR procedure. EXAM: Cardiac TAVR CT TECHNIQUE: The patient was scanned on a [employer]. A 120 kV retrospective scan was triggered in the descending thoracic aorta at 111 HU's. Gantry rotation speed was 250 msecs and collimation was .6 mm. 10 mg of IV Metoprolol were given. The 3D data set was reconstructed in 5% intervals of the R-R cycle. Systolic and diastolic phases were analyzed on a dedicated work station using MPR, MIP and VRT modes. The patient received 80 cc of contrast. FINDINGS: Aortic Valve: Trileaflet aortic valve with severely thickened and calcified leaflets and no calcifications extending into the LVOT. Aorta: Normal size, no dissection, mild diffuse atherosclerotic plaque and calcifications. Sinotubular Junction: 32 x 31 mm Ascending Thoracic Aorta: 34 x 34 mm Aortic Arch: 26 x 26 mm Descending Thoracic Aorta: 25 x 25 mm Sinus of Valsalva Measurements: Non-coronary: 33 mm Right -coronary: 33 mm  Left -coronary: 34 mm Coronary Artery Height above Annulus: Left Main: 14 mm Right Coronary: 18 mm Virtual Basal Annulus Measurements: Maximum/Minimum Diameter: 27.4 x 23.3 mm Mean Diameter: 24.6 mm Perimeter: 79.1 mm Area: 475 mm2 Optimum Fluoroscopic Angle for Delivery: LAO 12 CAU 13 IMPRESSION: 1. Trileaflet aortic valve with severely thickened and calcified leaflets and no calcifications extending into the LVOT. Aortic valve calcium score is 3216 consistent with severe aortic stenosis. Annular measurements suitable for delivery of a 26 mm Edwards-SAPIEN 3 Ultra valve. 2. Sufficient coronary to annulus distance. 3. Optimum Fluoroscopic Angle for Delivery: LAO 12 CAU 13. 4. No thrombus in the left atrial appendage. Electronically Signed   By: Ena Dawley   On: 03/22/2020 19:51   Result Date: 03/22/2020 EXAM: OVER-READ INTERPRETATION  CT CHEST The following report is an over-read performed by radiologist Dr. Salvatore Marvel of [facility], [place] on 03/22/2020. This over-read does not include interpretation of cardiac or coronary anatomy or pathology. The coronary CTA interpretation by the cardiologist is attached. COMPARISON:  03/15/2020 chest CT. FINDINGS: Please see the separate concurrent chest CT angiogram report for details. IMPRESSION: Please see the separate concurrent chest CT angiogram report for details. Electronically Signed: By: Ilona Sorrel M.D. On: 03/22/2020 11:40   DG Chest Port 1 View  Result Date: 03/25/2020 CLINICAL DATA:  Encounter for status post TAVR. EXAM: PORTABLE CHEST 1 VIEW COMPARISON:  Chest radiograph yesterday. Chest CT 03/22/2020 FINDINGS: Post TAVR. Stable mild cardiomegaly. Unchanged mediastinal contours. Aortic atherosclerosis. Right chest port remains in place with tip in the SVC. Moderate left pleural effusion which is partially loculated, similar to slightly improved from yesterday. Small right pleural effusion which was better appreciated on lateral view yesterday.  No pneumothorax or pulmonary edema. Apical predominant emphysema. IMPRESSION: 1. Post TAVR. Stable mild cardiomegaly. 2. Moderate left pleural effusion which is similar to slightly improved from yesterday. 3. Small right pleural effusion, better appreciated on prior imaging, but grossly similar. Aortic Atherosclerosis (ICD10-I70.0) and Emphysema (ICD10-J43.9). Electronically Signed   By: Keith Rake M.D.   On: 03/25/2020 16:38   DG Chest Port 1 View  Result Date: 03/16/2020 CLINICAL DATA:  Left thoracentesis. EXAM: PORTABLE CHEST 1 VIEW COMPARISON:  Ultrasound 03/16/2020.  Chest x-ray 03/15/2020. FINDINGS: Post thoracentesis chest x-ray reveals no evidence of pneumothorax. Interim reduction in size of left pleural effusion. Stable cardiomegaly. Stable bilateral interstitial prominence and bibasilar atelectasis. Stable small right pleural effusion. PowerPort in stable position. IMPRESSION: 1.  No evidence of pneumothorax post thoracentesis. 2. Stable bilateral interstitial prominence and bibasilar  atelectasis. Stable small right pleural effusion. Electronically Signed   By: Marcello Moores  Register   On: 03/16/2020 16:13   DG Chest Port 1 View  Result Date: 03/15/2020 CLINICAL DATA:  Increasing shortness of breath for the past week. History of lung cancer and COPD. EXAM: PORTABLE CHEST 1 VIEW COMPARISON:  CT chest dated December 01, 2019. Chest x-ray dated October 08, 2019. FINDINGS: Unchanged right chest wall port catheter. The cardiac silhouette is increasingly obscured. Normal mediastinal contours. New diffuse interstitial thickening with increasing now moderate left and new small right pleural effusions. Left greater than right basilar opacities, favoring atelectasis. No pneumothorax. No acute osseous abnormality. IMPRESSION: New interstitial pulmonary edema with increased moderate left and new small right pleural effusions. Electronically Signed   By: Titus Dubin M.D.   On: 03/15/2020 12:51   CT ANGIO  CHEST AORTA W/CM & OR WO/CM  Result Date: 03/22/2020 CLINICAL DATA:  Inpatient. Pre-TAVR evaluation. Severe aortic stenosis with chronic systolic heart failure. 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 EXAM: CT ANGIOGRAPHY CHEST, ABDOMEN AND PELVIS TECHNIQUE: Non-contrast CT of the chest was initially obtained. Multidetector CT imaging through the chest, abdomen and pelvis was performed using the standard protocol during bolus administration of intravenous contrast. Multiplanar reconstructed images and MIPs were obtained and reviewed to evaluate the vascular anatomy. CONTRAST:  155mL OMNIPAQUE IOHEXOL 350 MG/ML SOLN COMPARISON:  03/15/2020 chest CT. 12/11/2018 PET-CT. FINDINGS: CTA CHEST FINDINGS Cardiovascular: Mild cardiomegaly. Diffuse thickening and coarse calcification of the aortic valve. Small pericardial effusion, similar. Right internal jugular Port-A-Cath terminates in the lower third of the SVC. Left anterior descending and right coronary atherosclerosis. Atherosclerotic nonaneurysmal thoracic aorta. Normal caliber pulmonary arteries. No central pulmonary emboli. Mediastinum/Nodes: No discrete thyroid nodules. Unremarkable esophagus. No pathologically enlarged axillary, mediastinal or hilar lymph nodes. Lungs/Pleura: No pneumothorax. Small to moderate dependent bilateral pleural effusions, unchanged. Moderate to severe centrilobular emphysema with diffuse bronchial wall thickening. Sharply marginated lower left perihilar consolidation with associated mild bronchiectasis, volume loss and distortion, unchanged, compatible with postradiation change. Moderate passive atelectasis in the dependent lower lobes bilaterally. No acute interval consolidation. No significant pulmonary nodules. Musculoskeletal: No aggressive appearing focal osseous lesions. Chronic severe T7 vertebral compression fracture. Mild thoracic spondylosis. CTA ABDOMEN AND PELVIS FINDINGS Hepatobiliary:  Normal liver size. A few scattered subcentimeter hypodense liver lesions are too small to characterize and are not appreciably changed since 12/11/2018 PET-CT, considered benign. No new liver lesions. Normal gallbladder with no radiopaque cholelithiasis. No biliary ductal dilatation. Pancreas: Normal, with no mass or duct dilation. Spleen: Normal size. No mass. Adrenals/Urinary Tract: Normal adrenals. No hydronephrosis. Exophytic hyperdense 2.8 cm renal cortical lesion in the medial upper left kidney (series 9/image 105) and exophytic isodense 2.2 cm renal cortical lesion in the far upper left kidney (series 9/image 97), neither appreciably changed size since 12/11/2018 PET-CT. Numerous simple renal cortical cysts in both kidneys, largest an exophytic simple 6.3 cm renal cortical cyst in the posterior lower left kidney. Numerous subcentimeter hypodense renal cortical lesions in both kidneys are too small to characterize. Normal bladder. Stomach/Bowel: Normal non-distended stomach. Normal caliber small bowel with no small bowel wall thickening. Appendix not discretely visualized. Marked sigmoid diverticulosis with no large bowel wall thickening or significant pericolonic fat stranding. Vascular/Lymphatic: Atherosclerotic abdominal aorta with ectatic 2.5 cm infrarenal abdominal aorta. No pathologically enlarged lymph nodes in the abdomen or pelvis. Reproductive: Mildly enlarged prostate. Other: No pneumoperitoneum, ascites or focal fluid collection. Musculoskeletal: No aggressive appearing focal  osseous lesions. Mild lumbar spondylosis. VASCULAR MEASUREMENTS PERTINENT TO TAVR: AORTA: Minimal Aortic Diameter-13.2 x 10.0 mm Severity of Aortic Calcification-moderate to severe RIGHT PELVIS: Right Common Iliac Artery - Minimal Diameter-8.0 x 4.1 mm Tortuosity-moderate Calcification-severe Right External Iliac Artery - Minimal Diameter-6.5 x 4.7 mm Tortuosity-mild Calcification-moderate Right Common Femoral Artery - Minimal  Diameter-5.9 x 3.5 mm Tortuosity-mild Calcification-severe LEFT PELVIS: Left Common Iliac Artery - Minimal Diameter-6.8 x 5.7 mm Tortuosity-mild Calcification-severe Left External Iliac Artery - Minimal Diameter-5.3 x 4.2 mm Tortuosity-mild-to-moderate Calcification-severe Left Common Femoral Artery - Minimal Diameter-6.2 x 4.7 mm Tortuosity-mild Calcification-severe Review of the MIP images confirms the above findings. IMPRESSION: 1. Vascular findings and measurements pertinent to potential TAVR procedure, as detailed. 2. Diffuse thickening and coarse calcification of the aortic valve, compatible with the reported history of severe aortic stenosis. 3. Stable postradiation change in the lower left perihilar lung. No evidence of local tumor recurrence. No findings of metastatic disease. 4. Stable small to moderate dependent bilateral pleural effusions. 5. Mild cardiomegaly. Small pericardial effusion, similar. Two-vessel coronary atherosclerosis. 6. Marked sigmoid diverticulosis. 7. Mildly enlarged prostate. 8. Aortic Atherosclerosis (ICD10-I70.0) and Emphysema (ICD10-J43.9). Electronically Signed   By: Ilona Sorrel M.D.   On: 03/22/2020 14:03   ECHOCARDIOGRAM COMPLETE  Result Date: 03/26/2020    ECHOCARDIOGRAM REPORT   Patient Name:   ENRICO EADDY Date of Exam: 03/26/2020 Medical Rec #:  [id number]      Height:       68.0 in Accession #:    [id number]     Weight:       154.8 lb Date of Birth:  [birth year]      BSA:          1.833 m Patient Age:    27 years       BP:           113/99 mmHg Patient Gender: M              HR:           103 bpm. Exam Location:  Inpatient Procedure: 2D Echo, Color Doppler and Cardiac Doppler Indications:    Post-TAVR Evaluation  History:        Patient has prior history of Echocardiogram examinations, most                 recent 03/25/2020. COPD; Risk Factors:Hypertension. 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  Sonographer:    Raquel Sarna Senior RDCS Referring Phys:  [id number] [place]  1. Diffuse hypokinesis with septal and apical akinesis. Left ventricular ejection fraction, by estimation, is 30 to 35%. The left ventricle has moderately decreased function. The left ventricle demonstrates regional wall motion abnormalities (see scoring diagram/findings for description). The left ventricular internal cavity size was mildly dilated. There is mild left ventricular hypertrophy. Left ventricular diastolic parameters are indeterminate.  2. Right ventricular systolic function is normal. The right ventricular size is normal.  3. Small pericardial effusion seen most prominantly behind RA no change from pre implant.  4. The mitral valve is degenerative. Trivial mitral valve regurgitation.  5. Post TAVR with 26 mm Sapien 3 Ultra with no PVL. Meand gradient 7 peak 13 mmHg and AVA 2.6 cm2. Although gradients higher than implant I suspect implant gradients too low. Valve looks excellent. The aortic valve is tricuspid. Aortic valve regurgitation is not visualized. FINDINGS  Left Ventricle: Diffuse  hypokinesis with septal and apical akinesis. Left ventricular ejection fraction, by estimation, is 30 to 35%. The left ventricle has moderately decreased function. The left ventricle demonstrates regional wall motion abnormalities. The left ventricular internal cavity size was mildly dilated. There is mild left ventricular hypertrophy. Left ventricular diastolic parameters are indeterminate. Right Ventricle: The right ventricular size is normal. Right vetricular wall thickness was not assessed. Right ventricular systolic function is normal. Left Atrium: Left atrial size was normal in size. Right Atrium: Right atrial size was normal in size. Pericardium: Small pericardial effusion seen most prominantly behind RA no change from pre implant. A small pericardial effusion is present. Mitral Valve: The mitral valve is degenerative in appearance. There is moderate thickening of the  mitral valve leaflet(s). There is moderate calcification of the mitral valve leaflet(s). Severe mitral annular calcification. Trivial mitral valve regurgitation. MV peak gradient, 12.7 mmHg. The mean mitral valve gradient is 7.0 mmHg. Tricuspid Valve: The tricuspid valve is normal in structure. Tricuspid valve regurgitation is mild. Aortic Valve: Post TAVR with 26 mm Sapien 3 Ultra with no PVL. Meand gradient 7 peak 13 mmHg and AVA 2.6 cm2. Although gradients higher than implant I suspect implant gradients too low. Valve looks excellent. The aortic valve is tricuspid. Aortic valve regurgitation is not visualized. Aortic valve mean gradient measures 7.0 mmHg. Aortic valve peak gradient measures 13.1 mmHg. Aortic valve area, by VTI measures 2.56 cm. Pulmonic Valve: The pulmonic valve was normal in structure. Pulmonic valve regurgitation is mild. Aorta: The aortic root is normal in size and structure. IAS/Shunts: No atrial level shunt detected by color flow Doppler.  LEFT VENTRICLE PLAX 2D LVIDd:         4.20 cm      Diastology LVIDs:         3.70 cm      LV e' lateral: 2.72 cm/s LV PW:         1.40 cm      LV e' medial:  2.72 cm/s LV IVS:        1.50 cm LVOT diam:     2.20 cm LV SV:         73 LV SV Index:   40 LVOT Area:     3.80 cm  LV Volumes (MOD) LV vol d, MOD A2C: 99.5 ml LV vol d, MOD A4C: 122.0 ml LV vol s, MOD A2C: 64.5 ml LV vol s, MOD A4C: 81.7 ml LV SV MOD A2C:     35.0 ml LV SV MOD A4C:     122.0 ml LV SV MOD BP:      42.7 ml RIGHT VENTRICLE RV S prime:     12.40 cm/s TAPSE (M-mode): 1.8 cm LEFT ATRIUM             Index       RIGHT ATRIUM           Index LA diam:        3.70 cm 2.02 cm/m  RA Area:     17.40 cm LA Vol (A2C):   66.6 ml 36.34 ml/m RA Volume:   47.90 ml  26.14 ml/m LA Vol (A4C):   45.3 ml 24.72 ml/m LA Biplane Vol: 57.3 ml 31.26 ml/m  AORTIC VALVE AV Area (Vmax):    2.63 cm AV Area (Vmean):   2.80 cm AV Area (VTI):     2.56 cm AV Vmax:           181.00 cm/s AV Vmean:  124.000 cm/s AV VTI:            0.285 m AV Peak Grad:      13.1 mmHg AV Mean Grad:      7.0 mmHg LVOT Vmax:         125.00 cm/s LVOT Vmean:        91.200 cm/s LVOT VTI:          0.192 m LVOT/AV VTI ratio: 0.67  AORTA Ao Asc diam: 3.50 cm MITRAL VALVE MV Peak grad: 12.7 mmHg  SHUNTS MV Mean grad: 7.0 mmHg   Systemic VTI:  0.19 m MV Vmax:      1.78 m/s   Systemic Diam: 2.20 cm MV Vmean:     129.0 cm/s Jenkins Rouge MD Electronically signed by Jenkins Rouge MD Signature Date/Time: 03/26/2020/4:40:43 PM    Final    ECHOCARDIOGRAM COMPLETE  Result Date: 03/16/2020    ECHOCARDIOGRAM REPORT   Patient Name:   ANDREE GOLPHIN Date of Exam: 03/16/2020 Medical Rec #:  [id number]      Height:       68.0 in Accession #:    [id number]     Weight:       163.3 lb Date of Birth:  1941-02-17      BSA:          1.875 m Patient Age:    40 years       BP:           103/57 mmHg Patient Gender: M              HR:           112 bpm. Exam Location:  Inpatient Procedure: 2D Echo, Cardiac Doppler and Color Doppler Indications:    Dyspnea  History:        Patient has prior history of Echocardiogram examinations, most                 recent 12/11/2019. CHF, COPD, Aortic Valve Disease and Mitral                 Valve Disease, Signs/Symptoms:Dyspnea and Shortness of Breath;                 Risk Factors:Hypertension. Lung cancer, chemo, radiation.  Sonographer:    Dustin Flock Referring Phys: [phone] [place]  1. Hypokinesis of the distal anteroseptal wall and apex; overall moderate to severe LV dysfunction; calcified aortic valve with probable severe AS (mean gradient 31 mmHg; AVA 1 cm2) and mild AI; mild MR; mild LAE; small pericardial effusion with RA inversion.  2. Left ventricular ejection fraction, by estimation, is 30 to 35%. The left ventricle has moderate to severely decreased function. The left ventricle has no regional wall motion abnormalities. The left ventricular internal cavity size was mildly dilated. There is  mild left ventricular hypertrophy. Left ventricular diastolic parameters are indeterminate. Elevated left atrial pressure.  3. Right ventricular systolic function is normal. The right ventricular size is normal. There is mildly elevated pulmonary artery systolic pressure.  4. Left atrial size was mildly dilated.  5. The mitral valve is normal in structure. Mild mitral valve regurgitation. No evidence of mitral stenosis.  6. The aortic valve has an indeterminant number of cusps. Aortic valve regurgitation is mild. No aortic stenosis is present. FINDINGS  Left Ventricle: Left ventricular ejection fraction, by estimation, is 30 to 35%. The left ventricle has moderate to severely decreased function. The left ventricle has  no regional wall motion abnormalities. The left ventricular internal cavity size was mildly dilated. There is mild left ventricular hypertrophy. Left ventricular diastolic parameters are indeterminate. Elevated left atrial pressure. Right Ventricle: The right ventricular size is normal. Right ventricular systolic function is normal. There is mildly elevated pulmonary artery systolic pressure. The tricuspid regurgitant velocity is 2.49 m/s, and with an assumed right atrial pressure of 8 mmHg, the estimated right ventricular systolic pressure is 81.1 mmHg. Left Atrium: Left atrial size was mildly dilated. Right Atrium: Right atrial size was normal in size. Pericardium: A small pericardial effusion is present. Mitral Valve: The mitral valve is normal in structure. Normal mobility of the mitral valve leaflets. Severe mitral annular calcification. Mild mitral valve regurgitation. No evidence of mitral valve stenosis. MV peak gradient, 13.4 mmHg. The mean mitral valve gradient is 6.0 mmHg. Tricuspid Valve: The tricuspid valve is normal in structure. Tricuspid valve regurgitation is trivial. No evidence of tricuspid stenosis. Aortic Valve: The aortic valve has an indeterminant number of cusps. Aortic valve  regurgitation is mild. Aortic regurgitation PHT measures 210 msec. No aortic stenosis is present. Aortic valve mean gradient measures 31.0 mmHg. Aortic valve peak gradient measures 44.6 mmHg. Aortic valve area, by VTI measures 1.06 cm. Pulmonic Valve: The pulmonic valve was normal in structure. Pulmonic valve regurgitation is not visualized. No evidence of pulmonic stenosis. Aorta: The aortic root is normal in size and structure. Venous: The inferior vena cava was not well visualized.  Additional Comments: Hypokinesis of the distal anteroseptal wall and apex; overall moderate to severe LV dysfunction; calcified aortic valve with probable severe AS (mean gradient 31 mmHg; AVA 1 cm2) and mild AI; mild MR; mild LAE; small pericardial effusion with RA inversion.  LEFT VENTRICLE PLAX 2D LVIDd:         5.20 cm  Diastology LVIDs:         4.60 cm  LV e' lateral:   11.00 cm/s LV PW:         1.20 cm  LV E/e' lateral: 15.5 LV IVS:        1.20 cm  LV e' medial:    7.29 cm/s LVOT diam:     2.10 cm  LV E/e' medial:  23.5 LV SV:         77 LV SV Index:   41 LVOT Area:     3.46 cm  RIGHT VENTRICLE RV Basal diam:  2.50 cm RV S prime:     5.11 cm/s TAPSE (M-mode): 2.1 cm LEFT ATRIUM             Index       RIGHT ATRIUM           Index LA diam:        4.30 cm 2.29 cm/m  RA Area:     16.40 cm LA Vol (A2C):   68.8 ml 36.69 ml/m RA Volume:   47.10 ml  25.12 ml/m LA Vol (A4C):   65.0 ml 34.67 ml/m LA Biplane Vol: 67.5 ml 36.00 ml/m  AORTIC VALVE AV Area (Vmax):    1.33 cm AV Area (Vmean):   1.05 cm AV Area (VTI):     1.06 cm AV Vmax:           334.00 cm/s AV Vmean:          235.500 cm/s AV VTI:            0.725 m AV Peak Grad:      44.6  mmHg AV Mean Grad:      31.0 mmHg LVOT Vmax:         128.00 cm/s LVOT Vmean:        71.200 cm/s LVOT VTI:          0.222 m LVOT/AV VTI ratio: 0.31 AI PHT:            210 msec  AORTA Ao Root diam: 3.00 cm MITRAL VALVE                TRICUSPID VALVE MV Area (PHT): 3.37 cm     TR Peak grad:   24.8  mmHg MV Peak grad:  13.4 mmHg    TR Vmax:        249.00 cm/s MV Mean grad:  6.0 mmHg MV Vmax:       1.83 m/s     SHUNTS MV Vmean:      113.0 cm/s   Systemic VTI:  0.22 m MV Decel Time: 225 msec     Systemic Diam: 2.10 cm MV E velocity: 171.00 cm/s MV A velocity: 27.70 cm/s MV E/A ratio:  6.17 Kirk Ruths MD Electronically signed by Kirk Ruths MD Signature Date/Time: 03/16/2020/2:31:43 PM    Final    VAS US CAROTID  Result Date: 03/22/2020 Carotid Arterial Duplex Study Indications:       Pre tavr. Risk Factors:      Hypertension. Comparison Study:  no prior Performing Technologist: Abram Sander RVS  Examination Guidelines: A complete evaluation includes B-mode imaging, spectral Doppler, color Doppler, and power Doppler as needed of all accessible portions of each vessel. Bilateral testing is considered an integral part of a complete examination. Limited examinations for reoccurring indications may be performed as noted.  Right Carotid Findings: +----------+--------+--------+--------+------------------+--------+           PSV cm/sEDV cm/sStenosisPlaque DescriptionComments +----------+--------+--------+--------+------------------+--------+ CCA Prox  26                                                 +----------+--------+--------+--------+------------------+--------+ CCA Distal21      4                                          +----------+--------+--------+--------+------------------+--------+ ICA Prox                  Occluded                           +----------+--------+--------+--------+------------------+--------+ ICA Mid                   Occluded                           +----------+--------+--------+--------+------------------+--------+ ICA Distal                Occluded                           +----------+--------+--------+--------+------------------+--------+ ECA       131                                                 +----------+--------+--------+--------+------------------+--------+ +----------+--------+-------+--------+-------------------+  PSV cm/sEDV cmsDescribeArm Pressure (mmHG) +----------+--------+-------+--------+-------------------+ [id number]                                         +----------+--------+-------+--------+-------------------+ +---------+--------+--+--------+-+---------+ VertebralPSV cm/s28EDV cm/s8Antegrade +---------+--------+--+--------+-+---------+  Left Carotid Findings: +----------+--------+--------+--------+------------------+--------+           PSV cm/sEDV cm/sStenosisPlaque DescriptionComments +----------+--------+--------+--------+------------------+--------+ CCA Prox  67      14              heterogenous               +----------+--------+--------+--------+------------------+--------+ CCA Distal67      18              heterogenous               +----------+--------+--------+--------+------------------+--------+ ICA Prox  93      33      1-39%   heterogenous               +----------+--------+--------+--------+------------------+--------+ ICA Distal61      20                                         +----------+--------+--------+--------+------------------+--------+ ECA       95                                                 +----------+--------+--------+--------+------------------+--------+ +----------+--------+--------+--------+-------------------+           PSV cm/sEDV cm/sDescribeArm Pressure (mmHG) +----------+--------+--------+--------+-------------------+ [id number]                                          +----------+--------+--------+--------+-------------------+ +---------+--------+--+--------+-+---------+ VertebralPSV cm/s31EDV cm/s7Antegrade +---------+--------+--+--------+-+---------+   Summary: Right Carotid: Evidence consistent with a total occlusion of the right ICA. Left Carotid:  Velocities in the left ICA are consistent with a 1-39% stenosis. Vertebrals: Bilateral vertebral arteries demonstrate antegrade flow. *See table(s) above for measurements and observations.  Electronically signed by Monica Martinez MD on 03/22/2020 at 4:46:21 PM.    Final    ECHOCARDIOGRAM LIMITED  Result Date: 03/25/2020    ECHOCARDIOGRAM LIMITED REPORT   Patient Name:   CLARK CUFF Date of Exam: 03/25/2020 Medical Rec #:  [id number]      Height:       68.0 in Accession #:    [id number]     Weight:       155.4 lb Date of Birth:  09/07/1941      BSA:          1.836 m Patient Age:    75 years       BP:           99/62 mmHg Patient Gender: M              HR:           92 bpm. Exam Location:  Inpatient Procedure: Limited Echo and Limited Color Doppler Indications:     Aortic Stenosis 424.1 / 132.0  History:         Patient has prior history of Echocardiogram examinations, most  recent 03/16/2020. CHF, COPD, Signs/Symptoms:Shortness of                  Breath; Risk Factors:Hypertension and Former Smoker. GERD. Lung                  Cancer.                  Aortic Valve: 26 mm Edwards Sapien prosthetic, stented (TAVR)                  valve is present in the aortic position. Procedure Date:                  03/25/2020.  Sonographer:     Darlina Sicilian RDCS Referring Phys:  [id number] [place] Ganja THOMPS[place] Diagnosing Phys: Jenkins Rouge MD IMPRESSIONS  1. Septal and apical akinesis with generalized global hypokinesis Post implant and long pacing run no change in EF . Left ventricular ejection fraction, by estimation, is 25 to 30%. The left ventricle has severely decreased function. The left ventricle demonstrates regional wall motion abnormalities (see scoring diagram/findings for description).  2. No change in pericardial effusion post implant . The pericardial effusion is anterior to the right ventricle and posterior and lateral to the left ventricle.  3. The mitral valve is degenerative. Trivial mitral  valve regurgitation.  4. Pre TAVR: Tri leaflet AV with severe calcification and restricted leaflet motion Mild AR Severe low flow low gradient AS. AVA 1.1 cm2, peak gradient 28 mmHg mean 17 mmHg.         Post TAVR: well positioned 26 mm Sapien 3 Ultra valve No PVL. Peak gradient 2 mmHg mean gradient 1 mmHg AVA 3.3 cm2. The aortic valve has been repaired/replaced. Aortic valve regurgitation is not visualized. No aortic stenosis is present. There is a 26 mm Edwards Sapien prosthetic (TAVR) valve present in the aortic position. Procedure Date: 03/25/2020. FINDINGS  Left Ventricle: Septal and apical akinesis with generalized global hypokinesis Post implant and long pacing run no change in EF. Left ventricular ejection fraction, by estimation, is 25 to 30%. The left ventricle has severely decreased function. The left ventricle demonstrates regional wall motion abnormalities. Left Atrium: Left atrial size was not assessed. Right Atrium: Right atrial size was not assessed. Pericardium: No change in pericardial effusion post implant. Trivial pericardial effusion is present. The pericardial effusion is anterior to the right ventricle and posterior and lateral to the left ventricle. Mitral Valve: The mitral valve is degenerative in appearance. There is mild thickening of the mitral valve leaflet(s). There is mild calcification of the mitral valve leaflet(s). Moderate mitral annular calcification. Trivial mitral valve regurgitation. Tricuspid Valve: The tricuspid valve is not assessed. Aortic Valve: Pre TAVR: Tri leaflet AV with severe calcification and restricted leaflet motion Mild AR Severe low flow low gradient AS. AVA 1.1 cm2, peak gradient 28 mmHg mean 17 mmHg. Post TAVR: well positioned 26 mm Sapien 3 Ultra valve No PVL. Peak gradient 2 mmHg mean gradient 1 mmHg AVA 3.3 cm2. The aortic valve has been repaired/replaced. Aortic valve regurgitation is not visualized. No aortic stenosis is present. Aortic valve mean  gradient measures 17.0 mmHg. Aortic valve peak gradient measures 27.8 mmHg. Aortic valve area, by VTI measures 1.15 cm. There is a 26 mm Edwards Sapien prosthetic, stented (TAVR) valve present in the aortic position. Procedure Date: 03/25/2020. Pulmonic Valve: The pulmonic valve was not assessed. Aorta: The aortic root is normal in size and structure. IAS/Shunts: The interatrial  septum was not assessed.  LEFT VENTRICLE PLAX 2D LVOT diam:     2.20 cm LV SV:         61 LV SV Index:   33 LVOT Area:     3.80 cm  LV Volumes (MOD) LV vol d, MOD A2C: 102.0 ml LV vol d, MOD A4C: 151.0 ml LV vol s, MOD A2C: 83.9 ml LV vol s, MOD A4C: 89.4 ml LV SV MOD A2C:     18.1 ml LV SV MOD A4C:     151.0 ml LV SV MOD BP:      39.6 ml AORTIC VALVE AV Area (Vmax):    1.14 cm AV Area (Vmean):   1.02 cm AV Area (VTI):     1.15 cm AV Vmax:           263.50 cm/s AV Vmean:          192.500 cm/s AV VTI:            0.534 m AV Peak Grad:      27.8 mmHg AV Mean Grad:      17.0 mmHg LVOT Vmax:         79.10 cm/s LVOT Vmean:        51.700 cm/s LVOT VTI:          0.161 m LVOT/AV VTI ratio: 0.30  SHUNTS Systemic VTI:  0.16 m Systemic Diam: 2.20 cm Jenkins Rouge MD Electronically signed by Jenkins Rouge MD Signature Date/Time: 03/25/2020/2:01:10 PM    Final    Structural Heart Procedure  Result Date: 03/25/2020 See surgical note for result.  CT Angio Abd/Pel w/ and/or w/o  Result Date: 03/22/2020 CLINICAL DATA:  Inpatient. Pre-TAVR evaluation. Severe aortic stenosis with chronic systolic heart failure. 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 EXAM: CT ANGIOGRAPHY CHEST, ABDOMEN AND PELVIS TECHNIQUE: Non-contrast CT of the chest was initially obtained. Multidetector CT imaging through the chest, abdomen and pelvis was performed using the standard protocol during bolus administration of intravenous contrast. Multiplanar reconstructed images and MIPs were obtained and reviewed to evaluate the vascular anatomy.  CONTRAST:  191mL OMNIPAQUE IOHEXOL 350 MG/ML SOLN COMPARISON:  03/15/2020 chest CT. 12/11/2018 PET-CT. FINDINGS: CTA CHEST FINDINGS Cardiovascular: Mild cardiomegaly. Diffuse thickening and coarse calcification of the aortic valve. Small pericardial effusion, similar. Right internal jugular Port-A-Cath terminates in the lower third of the SVC. Left anterior descending and right coronary atherosclerosis. Atherosclerotic nonaneurysmal thoracic aorta. Normal caliber pulmonary arteries. No central pulmonary emboli. Mediastinum/Nodes: No discrete thyroid nodules. Unremarkable esophagus. No pathologically enlarged axillary, mediastinal or hilar lymph nodes. Lungs/Pleura: No pneumothorax. Small to moderate dependent bilateral pleural effusions, unchanged. Moderate to severe centrilobular emphysema with diffuse bronchial wall thickening. Sharply marginated lower left perihilar consolidation with associated mild bronchiectasis, volume loss and distortion, unchanged, compatible with postradiation change. Moderate passive atelectasis in the dependent lower lobes bilaterally. No acute interval consolidation. No significant pulmonary nodules. Musculoskeletal: No aggressive appearing focal osseous lesions. Chronic severe T7 vertebral compression fracture. Mild thoracic spondylosis. CTA ABDOMEN AND PELVIS FINDINGS Hepatobiliary: Normal liver size. A few scattered subcentimeter hypodense liver lesions are too small to characterize and are not appreciably changed since 12/11/2018 PET-CT, considered benign. No new liver lesions. Normal gallbladder with no radiopaque cholelithiasis. No biliary ductal dilatation. Pancreas: Normal, with no mass or duct dilation. Spleen: Normal size. No mass. Adrenals/Urinary Tract: Normal adrenals. No hydronephrosis. Exophytic hyperdense 2.8 cm renal cortical lesion in the medial upper left kidney (series 9/image 105) and  exophytic isodense 2.2 cm renal cortical lesion in the far upper left kidney  (series 9/image 97), neither appreciably changed size since 12/11/2018 PET-CT. Numerous simple renal cortical cysts in both kidneys, largest an exophytic simple 6.3 cm renal cortical cyst in the posterior lower left kidney. Numerous subcentimeter hypodense renal cortical lesions in both kidneys are too small to characterize. Normal bladder. Stomach/Bowel: Normal non-distended stomach. Normal caliber small bowel with no small bowel wall thickening. Appendix not discretely visualized. Marked sigmoid diverticulosis with no large bowel wall thickening or significant pericolonic fat stranding. Vascular/Lymphatic: Atherosclerotic abdominal aorta with ectatic 2.5 cm infrarenal abdominal aorta. No pathologically enlarged lymph nodes in the abdomen or pelvis. Reproductive: Mildly enlarged prostate. Other: No pneumoperitoneum, ascites or focal fluid collection. Musculoskeletal: No aggressive appearing focal osseous lesions. Mild lumbar spondylosis. VASCULAR MEASUREMENTS PERTINENT TO TAVR: AORTA: Minimal Aortic Diameter-13.2 x 10.0 mm Severity of Aortic Calcification-moderate to severe RIGHT PELVIS: Right Common Iliac Artery - Minimal Diameter-8.0 x 4.1 mm Tortuosity-moderate Calcification-severe Right External Iliac Artery - Minimal Diameter-6.5 x 4.7 mm Tortuosity-mild Calcification-moderate Right Common Femoral Artery - Minimal Diameter-5.9 x 3.5 mm Tortuosity-mild Calcification-severe LEFT PELVIS: Left Common Iliac Artery - Minimal Diameter-6.8 x 5.7 mm Tortuosity-mild Calcification-severe Left External Iliac Artery - Minimal Diameter-5.3 x 4.2 mm Tortuosity-mild-to-moderate Calcification-severe Left Common Femoral Artery - Minimal Diameter-6.2 x 4.7 mm Tortuosity-mild Calcification-severe Review of the MIP images confirms the above findings. IMPRESSION: 1. Vascular findings and measurements pertinent to potential TAVR procedure, as detailed. 2. Diffuse thickening and coarse calcification of the aortic valve, compatible  with the reported history of severe aortic stenosis. 3. Stable postradiation change in the lower left perihilar lung. No evidence of local tumor recurrence. No findings of metastatic disease. 4. Stable small to moderate dependent bilateral pleural effusions. 5. Mild cardiomegaly. Small pericardial effusion, similar. Two-vessel coronary atherosclerosis. 6. Marked sigmoid diverticulosis. 7. Mildly enlarged prostate. 8. Aortic Atherosclerosis (ICD10-I70.0) and Emphysema (ICD10-J43.9). Electronically Signed   By: Ilona Sorrel M.D.   On: 03/22/2020 14:03   IR THORACENTESIS ASP PLEURAL SPACE W/IMG GUIDE  Result Date: 03/26/2020 INDICATION: Patient with a history of stage IIIb non-small cell lung cancer and recent TAVR presents to IR with left pleural effusion. IR asked to perform a therapeutic thoracentesis and send specimens to the lab. EXAM: ULTRASOUND GUIDED LEFT THORACENTESIS MEDICATIONS: 1% lidocaine, 10 ml COMPLICATIONS: None immediate. PROCEDURE: An ultrasound guided thoracentesis was thoroughly discussed with the patient and questions answered. The benefits, risks, alternatives and complications were also discussed. The patient understands and wishes to proceed with the procedure. Written consent was obtained. Ultrasound was performed to localize and mark an adequate pocket of fluid in the left chest. The area was then prepped and draped in the normal sterile fashion. 1% Lidocaine was used for local anesthesia. Under ultrasound guidance a 6 Fr Safe-T-Centesis catheter was introduced. Thoracentesis was performed. The catheter was removed and a dressing applied. FINDINGS: A total of approximately 750 ml of clear yellow fluid was removed. Samples were sent to the laboratory as requested by the clinical team. IMPRESSION: Successful ultrasound guided left thoracentesis yielding 750 ml of pleural fluid. Read by: Soyla Dryer, NP Electronically Signed   By: Aletta Edouard M.D.   On: 03/26/2020 12:40   US  THORACENTESIS ASP PLEURAL SPACE W/IMG GUIDE  Result Date: 03/16/2020 INDICATION: Patient history of lung cancer, COPD admitted for COPD revision on her bilateral pleural effusions presents for therapeutic and diagnostic thoracentesis EXAM: ULTRASOUND GUIDED THERAPEUTIC AND DIAGNOSTIC THORACENTESIS MEDICATIONS: Lidocaine 1% 10  mL COMPLICATIONS: None immediate. PROCEDURE: An ultrasound guided thoracentesis was thoroughly discussed with the patient and questions answered. The benefits, risks, alternatives and complications were also discussed. The patient understands and wishes to proceed with the procedure. Written consent was obtained. Ultrasound was performed to localize and mark an adequate pocket of fluid in the left-sided chest. The area was then prepped and draped in the normal sterile fashion. 1% Lidocaine was used for local anesthesia. Under ultrasound guidance a 6 Fr Safe-T-Centesis catheter was introduced. Thoracentesis was performed. The catheter was removed and a dressing applied. FINDINGS: A total of approximately 1170 mL of straw colored fluid was removed. Samples were sent to the laboratory as requested by the clinical team. IMPRESSION: Successful ultrasound guided therapeutic and diagnostic left-sided thoracentesis yielding 1170 mL of pleural fluid. Read by Rushie Nyhan NP Electronically Signed   By: Jacqulynn Cadet M.D.   On: 03/16/2020 16:27     ASSESSMENT/PLAN:  This is a very pleasant 79 year old Caucasian male diagnosed with stage IIIb non-small cell lung cancer, adenocarcinoma of the left lower lobe. He was diagnosed in January 2020. He has no actionable mutations.  The patient underwent concurrent chemoradiation with carboplatin for an AUC of 2 and paclitaxel 45 mg/m. He is status post 5 cycles. He was unable to proceed with cycle #6 secondary to thrombocytopenia.   The patient complete 26 cycles of consolidation immunotherapy with Imfinzi 10 mg/kg IV every 2 weeks.    The patient recently had a restaging CT scan performed.  Dr. Julien Nordmann personally and independently reviewed the scan and discussed the results with the patient today.  The scan did not show any evidence of disease progression.  Dr. Julien Nordmann recommends that the patient continue on observation.  I will arrange for the patient to have a restaging CT scan in 3 months.  We will see the patient back for follow-up visit in 3 months for evaluation and to review his scan results.  The patient inquired about the pneumonia vaccine. Directed the patient to his PCP to make sure he is up to date on his vaccines, if not already done so.   He will continue to follow with pulmonology for his chronic hypoxic respiratory failure/COPD and cardiology for his recent TAVR.   The patient was advised to call immediately if he has any concerning symptoms in the interval. The patient voices understanding of current disease status and treatment options and is in agreement with the current care plan. All questions were answered. The patient knows to call the clinic with any problems, questions or concerns. We can certainly see the patient much sooner if necessary.       Orders Placed This Encounter  Procedures  . CT Chest W Contrast    Standing Status:   Future    Standing Expiration Date:   04/08/2021    Order Specific Question:   ** REASON FOR EXAM (FREE TEXT)    Answer:   Restaging Lung Cancer    Order Specific Question:   If indicated for the ordered procedure, I authorize the administration of contrast media per Radiology protocol    Answer:   Yes    Order Specific Question:   Preferred imaging location?    Answer:   [facility]    Order Specific Question:   Radiology Contrast Protocol - do NOT remove file path    Answer:   [email or web address]  . CBC with Differential ([place] Only)    Standing Status:   Future  Standing Expiration Date:   04/08/2021  . CMP  ([place] only)    Standing Status:   Future    Standing Expiration Date:   04/08/2021     Tobe Sos Carel Carrier, PA-C 04/08/20   ADDENDUM: Hematology/Oncology Attending: I had a face-to-face encounter with the patient today.  I recommended his care plan.  This is a very pleasant 79 years old white male with stage IIIb non-small cell lung cancer, adenocarcinoma status post a course of concurrent chemoradiation followed by consolidation treatment with immunotherapy for 1 year.  The patient is currently on observation and he is feeling fine.  He recently underwent TAVR procedure for severe aortic stenosis.  The patient is feeling slightly better than before.  He continues to have the shortness of breath at baseline and currently on home oxygen. He had repeat CT scan of the chest during his hospitalization that showed no clear evidence for disease progression. I recommended for the patient to continue on observation with repeat CT scan of the chest in 3 months for restaging of his disease. He was advised to call immediately if he has any concerning symptoms in the interval.  Disclaimer: This note was dictated with voice recognition software. Similar sounding words can inadvertently be transcribed and may be missed upon review. Eilleen Kempf, MD 04/08/20

## 2020-04-08 ENCOUNTER — Inpatient Hospital Stay: Payer: Medicare HMO

## 2020-04-08 ENCOUNTER — Encounter: Payer: Self-pay | Admitting: Physician Assistant

## 2020-04-08 ENCOUNTER — Other Ambulatory Visit: Payer: Self-pay

## 2020-04-08 ENCOUNTER — Inpatient Hospital Stay: Payer: Medicare HMO | Attending: Internal Medicine | Admitting: Physician Assistant

## 2020-04-08 VITALS — BP 103/60 | HR 98 | Temp 98.9°F | Resp 17 | Ht 68.0 in | Wt 156.8 lb

## 2020-04-08 DIAGNOSIS — C3492 Malignant neoplasm of unspecified part of left bronchus or lung: Secondary | ICD-10-CM

## 2020-04-08 DIAGNOSIS — I2510 Atherosclerotic heart disease of native coronary artery without angina pectoris: Secondary | ICD-10-CM | POA: Diagnosis not present

## 2020-04-08 DIAGNOSIS — I5022 Chronic systolic (congestive) heart failure: Secondary | ICD-10-CM | POA: Diagnosis not present

## 2020-04-08 DIAGNOSIS — Z7982 Long term (current) use of aspirin: Secondary | ICD-10-CM | POA: Diagnosis not present

## 2020-04-08 DIAGNOSIS — J449 Chronic obstructive pulmonary disease, unspecified: Secondary | ICD-10-CM | POA: Diagnosis not present

## 2020-04-08 DIAGNOSIS — Z95828 Presence of other vascular implants and grafts: Secondary | ICD-10-CM

## 2020-04-08 DIAGNOSIS — Z79899 Other long term (current) drug therapy: Secondary | ICD-10-CM | POA: Insufficient documentation

## 2020-04-08 DIAGNOSIS — J8410 Pulmonary fibrosis, unspecified: Secondary | ICD-10-CM | POA: Diagnosis not present

## 2020-04-08 DIAGNOSIS — J9621 Acute and chronic respiratory failure with hypoxia: Secondary | ICD-10-CM | POA: Diagnosis not present

## 2020-04-08 DIAGNOSIS — I083 Combined rheumatic disorders of mitral, aortic and tricuspid valves: Secondary | ICD-10-CM | POA: Diagnosis not present

## 2020-04-08 DIAGNOSIS — I110 Hypertensive heart disease with heart failure: Secondary | ICD-10-CM | POA: Diagnosis not present

## 2020-04-08 DIAGNOSIS — K219 Gastro-esophageal reflux disease without esophagitis: Secondary | ICD-10-CM | POA: Diagnosis not present

## 2020-04-08 DIAGNOSIS — Z7902 Long term (current) use of antithrombotics/antiplatelets: Secondary | ICD-10-CM | POA: Insufficient documentation

## 2020-04-08 DIAGNOSIS — J9611 Chronic respiratory failure with hypoxia: Secondary | ICD-10-CM | POA: Diagnosis not present

## 2020-04-08 DIAGNOSIS — J439 Emphysema, unspecified: Secondary | ICD-10-CM | POA: Diagnosis not present

## 2020-04-08 DIAGNOSIS — Z48812 Encounter for surgical aftercare following surgery on the circulatory system: Secondary | ICD-10-CM | POA: Diagnosis not present

## 2020-04-08 DIAGNOSIS — J90 Pleural effusion, not elsewhere classified: Secondary | ICD-10-CM | POA: Insufficient documentation

## 2020-04-08 DIAGNOSIS — I0981 Rheumatic heart failure: Secondary | ICD-10-CM | POA: Diagnosis not present

## 2020-04-08 DIAGNOSIS — C3432 Malignant neoplasm of lower lobe, left bronchus or lung: Secondary | ICD-10-CM | POA: Diagnosis not present

## 2020-04-08 DIAGNOSIS — I5023 Acute on chronic systolic (congestive) heart failure: Secondary | ICD-10-CM | POA: Diagnosis not present

## 2020-04-08 DIAGNOSIS — R5383 Other fatigue: Secondary | ICD-10-CM | POA: Diagnosis not present

## 2020-04-08 DIAGNOSIS — Z452 Encounter for adjustment and management of vascular access device: Secondary | ICD-10-CM | POA: Insufficient documentation

## 2020-04-08 DIAGNOSIS — N179 Acute kidney failure, unspecified: Secondary | ICD-10-CM | POA: Diagnosis not present

## 2020-04-08 DIAGNOSIS — Z9981 Dependence on supplemental oxygen: Secondary | ICD-10-CM | POA: Insufficient documentation

## 2020-04-08 DIAGNOSIS — R634 Abnormal weight loss: Secondary | ICD-10-CM | POA: Insufficient documentation

## 2020-04-08 LAB — CBC WITH DIFFERENTIAL (CANCER CENTER ONLY)
Abs Immature Granulocytes: 0.03 10*3/uL (ref 0.00–0.07)
Basophils Absolute: 0.1 10*3/uL (ref 0.0–0.1)
Basophils Relative: 2 %
Eosinophils Absolute: 0.2 10*3/uL (ref 0.0–0.5)
Eosinophils Relative: 3 %
HCT: 28.7 % — ABNORMAL LOW (ref 39.0–52.0)
Hemoglobin: 9.4 g/dL — ABNORMAL LOW (ref 13.0–17.0)
Immature Granulocytes: 1 %
Lymphocytes Relative: 8 %
Lymphs Abs: 0.5 10*3/uL — ABNORMAL LOW (ref 0.7–4.0)
MCH: 38.8 pg — ABNORMAL HIGH (ref 26.0–34.0)
MCHC: 32.8 g/dL (ref 30.0–36.0)
MCV: 118.6 fL — ABNORMAL HIGH (ref 80.0–100.0)
Monocytes Absolute: 0.5 10*3/uL (ref 0.1–1.0)
Monocytes Relative: 9 %
Neutro Abs: 4.4 10*3/uL (ref 1.7–7.7)
Neutrophils Relative %: 77 %
Platelet Count: 297 10*3/uL (ref 150–400)
RBC: 2.42 MIL/uL — ABNORMAL LOW (ref 4.22–5.81)
RDW: 15.3 % (ref 11.5–15.5)
WBC Count: 5.6 10*3/uL (ref 4.0–10.5)
nRBC: 0 % (ref 0.0–0.2)

## 2020-04-08 LAB — CMP (CANCER CENTER ONLY)
ALT: 17 U/L (ref 0–44)
AST: 15 U/L (ref 15–41)
Albumin: 3.1 g/dL — ABNORMAL LOW (ref 3.5–5.0)
Alkaline Phosphatase: 91 U/L (ref 38–126)
Anion gap: 9 (ref 5–15)
BUN: 24 mg/dL — ABNORMAL HIGH (ref 8–23)
CO2: 24 mmol/L (ref 22–32)
Calcium: 8.5 mg/dL — ABNORMAL LOW (ref 8.9–10.3)
Chloride: 107 mmol/L (ref 98–111)
Creatinine: 1.17 mg/dL (ref 0.61–1.24)
GFR, Est AFR Am: >60 mL/min (ref >60)
GFR, Estimated: 59 mL/min — ABNORMAL LOW (ref >60)
Glucose, Bld: 105 mg/dL — ABNORMAL HIGH (ref 70–99)
Potassium: 4.4 mmol/L (ref 3.5–5.1)
Sodium: 140 mmol/L (ref 135–145)
Total Bilirubin: 0.5 mg/dL (ref 0.3–1.2)
Total Protein: 5.9 g/dL — ABNORMAL LOW (ref 6.5–8.1)

## 2020-04-08 MED ORDER — HEPARIN SOD (PORK) LOCK FLUSH 100 UNIT/ML IV SOLN
500.0000 [IU] | Freq: Once | INTRAVENOUS | Status: AC | PRN
  Administered 2020-04-08: 500 [IU]
  Filled 2020-04-08: qty 5

## 2020-04-08 MED ORDER — SODIUM CHLORIDE 0.9% FLUSH
10.0000 mL | INTRAVENOUS | Status: DC | PRN
  Administered 2020-04-08: 10 mL
  Filled 2020-04-08: qty 10

## 2020-04-09 ENCOUNTER — Telehealth: Payer: Self-pay | Admitting: Physician Assistant

## 2020-04-09 DIAGNOSIS — Z48812 Encounter for surgical aftercare following surgery on the circulatory system: Secondary | ICD-10-CM | POA: Diagnosis not present

## 2020-04-09 DIAGNOSIS — J8410 Pulmonary fibrosis, unspecified: Secondary | ICD-10-CM | POA: Diagnosis not present

## 2020-04-09 DIAGNOSIS — I5023 Acute on chronic systolic (congestive) heart failure: Secondary | ICD-10-CM | POA: Diagnosis not present

## 2020-04-09 DIAGNOSIS — J439 Emphysema, unspecified: Secondary | ICD-10-CM | POA: Diagnosis not present

## 2020-04-09 DIAGNOSIS — Z23 Encounter for immunization: Secondary | ICD-10-CM | POA: Diagnosis not present

## 2020-04-09 DIAGNOSIS — I2510 Atherosclerotic heart disease of native coronary artery without angina pectoris: Secondary | ICD-10-CM | POA: Diagnosis not present

## 2020-04-09 DIAGNOSIS — N179 Acute kidney failure, unspecified: Secondary | ICD-10-CM | POA: Diagnosis not present

## 2020-04-09 DIAGNOSIS — I110 Hypertensive heart disease with heart failure: Secondary | ICD-10-CM | POA: Diagnosis not present

## 2020-04-09 DIAGNOSIS — J9621 Acute and chronic respiratory failure with hypoxia: Secondary | ICD-10-CM | POA: Diagnosis not present

## 2020-04-09 DIAGNOSIS — I0981 Rheumatic heart failure: Secondary | ICD-10-CM | POA: Diagnosis not present

## 2020-04-09 NOTE — Telephone Encounter (Signed)
Scheduled per los. Called and left msg. Mailed printout  °

## 2020-04-13 DIAGNOSIS — I110 Hypertensive heart disease with heart failure: Secondary | ICD-10-CM | POA: Diagnosis not present

## 2020-04-13 DIAGNOSIS — Z48812 Encounter for surgical aftercare following surgery on the circulatory system: Secondary | ICD-10-CM | POA: Diagnosis not present

## 2020-04-13 DIAGNOSIS — I0981 Rheumatic heart failure: Secondary | ICD-10-CM | POA: Diagnosis not present

## 2020-04-13 DIAGNOSIS — N179 Acute kidney failure, unspecified: Secondary | ICD-10-CM | POA: Diagnosis not present

## 2020-04-13 DIAGNOSIS — I2510 Atherosclerotic heart disease of native coronary artery without angina pectoris: Secondary | ICD-10-CM | POA: Diagnosis not present

## 2020-04-13 DIAGNOSIS — J8410 Pulmonary fibrosis, unspecified: Secondary | ICD-10-CM | POA: Diagnosis not present

## 2020-04-13 DIAGNOSIS — J439 Emphysema, unspecified: Secondary | ICD-10-CM | POA: Diagnosis not present

## 2020-04-13 DIAGNOSIS — J9621 Acute and chronic respiratory failure with hypoxia: Secondary | ICD-10-CM | POA: Diagnosis not present

## 2020-04-13 DIAGNOSIS — I5023 Acute on chronic systolic (congestive) heart failure: Secondary | ICD-10-CM | POA: Diagnosis not present

## 2020-04-15 ENCOUNTER — Other Ambulatory Visit: Payer: Self-pay

## 2020-04-15 ENCOUNTER — Encounter: Payer: Self-pay | Admitting: Internal Medicine

## 2020-04-15 ENCOUNTER — Other Ambulatory Visit: Payer: Medicare HMO | Admitting: Internal Medicine

## 2020-04-15 DIAGNOSIS — I2510 Atherosclerotic heart disease of native coronary artery without angina pectoris: Secondary | ICD-10-CM | POA: Diagnosis not present

## 2020-04-15 DIAGNOSIS — Z48812 Encounter for surgical aftercare following surgery on the circulatory system: Secondary | ICD-10-CM | POA: Diagnosis not present

## 2020-04-15 DIAGNOSIS — Z515 Encounter for palliative care: Secondary | ICD-10-CM

## 2020-04-15 DIAGNOSIS — I5023 Acute on chronic systolic (congestive) heart failure: Secondary | ICD-10-CM | POA: Diagnosis not present

## 2020-04-15 DIAGNOSIS — I110 Hypertensive heart disease with heart failure: Secondary | ICD-10-CM | POA: Diagnosis not present

## 2020-04-15 DIAGNOSIS — N179 Acute kidney failure, unspecified: Secondary | ICD-10-CM | POA: Diagnosis not present

## 2020-04-15 DIAGNOSIS — J8410 Pulmonary fibrosis, unspecified: Secondary | ICD-10-CM | POA: Diagnosis not present

## 2020-04-15 DIAGNOSIS — I0981 Rheumatic heart failure: Secondary | ICD-10-CM | POA: Diagnosis not present

## 2020-04-15 DIAGNOSIS — Z7189 Other specified counseling: Secondary | ICD-10-CM

## 2020-04-15 DIAGNOSIS — J9621 Acute and chronic respiratory failure with hypoxia: Secondary | ICD-10-CM | POA: Diagnosis not present

## 2020-04-15 DIAGNOSIS — J439 Emphysema, unspecified: Secondary | ICD-10-CM | POA: Diagnosis not present

## 2020-04-15 NOTE — Progress Notes (Signed)
May 20th, 2021 [facility] Palliative Care Consult Note Telephone: [phone]  Fax: [phone]   PATIENT NAME: Robert Dixon DOB: 08-16-1941 MRN: [id number] [address] [place] [phone]   PRIMARY CARE PROVIDER:   [name], MD [place] [employer] [address] [place] [address]   Dr.Mohamad Robert (Oncology) Dr. Minus Dixon (Cardiology)   REFERRING PROVIDER:  Percell Dixon [facility])   RESPONSIBLE PARTY:   Robert Dixon, Robert Dixon (D-I-L) [phone] ([place]), wife of Robert Dixon [phone]. [place] [phone], (PM) [phone].  ASSESSMENT / RECOMMENDATIONS:  1. Advance Care Planning: A. Directives: DNR form in home. I had reviewed and left a copy of a MOST form previous visit; pt wishes to defer completion of this, at this time.  B. Goals of Care:  -Hoping to continue to increase in strength/endurance/decreased work of breathing.    -Patient feels prognosis is improved with recent trans venous aortic valve replacement, and stable lung cancer (fluid analysis (form thoracentesis) neg for malignant cells. Recent (03/22/20) chest CT without evidence disease progression. Completed course chemo/immunotherapy. Plan surveillance CTs q 3 months, with P-A-C flushes/labs q 6 wks. -   2. Cognitive/Functional/Symptom management:  Patient of A & O x 3. Pleasantly conversant/knowledgeable regarding health issues. Managing his medications (D-I-L has mapped out) very well.   Working with [facility] home [facility]. Feels some improvement as ambulating about fairly well without use of assistive devices. Uses furniture/walls on occasion for stability.  Decreased appetite (hates the low Na diet) but supplements with protein drinks. We discussed liberalizing his low Na diet a bit but family resistant; they don't want to take a chance on backsliding with his CHF control. His weight this am was 155lbs. At a height of 5'8" his BMI is about  24kg/m2. Receiving close monitoring for signs CHF by Robert Dixon [phone]) and family. Monitoring daily weights and following a strict low sodium diet.  He is independent in ADLs with stand by assist for shower. Chronic urinary urgency/nocturia (1-3x/night) d/t prostate ([place]).   Continued dyspnea with decreased saturations to high 70's with minimal activity such as toileting with BM, dressing, or showering. Returns to baseline within 2-3 min. Discussed increasing his oxygen from 3 LPM to 4-5 LPM during these activities. Resting sats low to mid 90's.     3. Family Supports: Widowed; spouse passed 4 yrs ago. He lives alone in the home in which he raised his family. He is very close to his 2 sons Robert Dixon and Robert Dixon. 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 Patient's sister in [place] sister. Robert Dixon) coming to the home 5 days week initially, now 2 days/week. sons visit frequently/provide meal prep.   4. Follow up Palliative Care Visit: Patient wishes to decrease the flow of health care provider/appointments for now. I left my card with my contact information; he will call me on a prn basis.   I spent 60 minutes providing this consultation from 11am to noon. More than 50% of the time in this consultation was spent coordinating communication.    Robert Dixon is a 79 y.o. male with h/o non-small cell carcinoma of L Lung stage 3. (dx Jan 2020. Chemoradiation, Immunotherapy), L lung radiation fibrosis, loculated L pleural effusion (CT Nov 2020) continuous oxygen therapy, severe aortic stenosis with AVA 0.78 and gradient 39. Moderate aortic regurgitation. Moderate pericardial effusion. Preserved ejection  fraction (echo Nov 2020; f/u echo scheduled for July 2021). COPD (centrilobular emphysema), GERD, HTN, Legionnaire's disease, migraines.  -4/19-5/2/201 hospitalized: transcatheter aortic valve replacement (post op  tachycardia/beta -blocker). Post op ECHO EF 30-35%. Cardiac Cath: diffuse non-obst CAD. AKI thought d/t diuresis. Creatinine 1.28 at discharge. L thoracentesis for pleural effusion 1157ml transudate without malignant cells. Repeat thoracentesis post TAVR, for recurrent bilateral pleural effusions (L 756ml/R not enough fluid for thoracentesis). Found with 100% R carotid artery and mid L internal carotid artery (1-39%). This is a f/u Palliative Care visit from 02/23/2020.   CODE STATUS: DNR   PPS: 50%   HOSPICE ELIGIBILITY/DIAGNOSIS: TBD  PAST MEDICAL Robert:  Past Medical Robert:  Diagnosis Date  . Acute on chronic respiratory failure with hypoxia ([place]) 03/16/2020  . Acute respiratory failure ([place]) 04/09/2013  . Carotid artery disease ([place]) 03/28/2020   Carotid US 02/2020:  R 100%; L 1-39%  . Chronic systolic CHF 90/24/0973   Echo 02/2020: EF 30-35, small pericardial effusion, trivial MR, status post TAVR with no PVL, mean gradient 7 mmHg  . Complication of anesthesia   . COPD (chronic obstructive pulmonary disease) ([place])   . GERD (gastroesophageal reflux disease)   . Hemoptysis 11/20/2018  . Robert of hiatal hernia   . Hypertension   . 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 Mediastinal adenopathy 12/06/2018  . Migraine with visual aura   . Non-small cell carcinoma of left lung, stage 3 ([place]) 12/19/2018  . Non-small cell lung cancer ([place]) dx'd 11/20/18  . Pericardial effusion 10/14/2019  . Pneumonia 11/20/2018  . PONV (postoperative nausea and vomiting)   . Primary malignant neoplasm of left upper lobe of lung ([place]) 12/12/2018  . Pulmonary nodule 11/20/2018  . 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 Severe aortic stenosis 10/14/2019  . Skin cancer     SOCIAL HX:  Social Robert   Tobacco Use  . Smoking status: Former Smoker    Years: 30.00    Types: Cigarettes    Quit date: 2000    Years since quitting: 21.4  . Smokeless tobacco: Never Used  Substance Use  Topics  . Alcohol use: No    ALLERGIES: No Known Allergies   PERTINENT MEDICATIONS:  Outpatient Encounter Medications as of 04/15/2020  Medication Sig  . acetaminophen (TYLENOL) 325 MG tablet Take 2 tablets (650 mg total) by mouth every 6 (six) hours as needed for mild pain (or Fever >/= 101).  Marland Kitchen albuterol (PROVENTIL) (2.5 MG/3ML) 0.083% nebulizer solution Take 3 mLs (2.5 mg total) by nebulization every 4 (four) hours.  Marland Kitchen albuterol (VENTOLIN HFA) 108 (90 Base) MCG/ACT inhaler Inhale 2 puffs into the lungs every 6 (six) hours as needed for wheezing or shortness of breath.  Marland Kitchen alum & mag hydroxide-simeth (MAALOX/MYLANTA) 200-200-20 MG/5ML suspension Take 15 mLs by mouth every 6 (six) hours as needed for indigestion or heartburn.  Marland Kitchen ascorbic acid (VITAMIN C) 500 MG tablet Take 500 mg by mouth daily.  Marland Kitchen aspirin 81 MG chewable tablet Chew 1 tablet (81 mg total) by mouth daily.  Marland Kitchen atorvastatin (LIPITOR) 40 MG tablet Take 1 tablet (40 mg total) by mouth daily.  . clopidogrel (PLAVIX) 75 MG tablet Take 1 tablet (75 mg total) by mouth daily with breakfast.  . Cyanocobalamin (VITAMIN B 12 PO) Take 1 capsule by mouth daily. 2500 mcg  . dimenhyDRINATE (DRAMAMINE) 50 MG tablet Take 50 mg by mouth every 8 (eight) hours as needed for dizziness.   . docusate sodium (COLACE)  100 MG capsule Take 100 mg by mouth daily as needed for mild constipation or moderate constipation.  . furosemide (LASIX) 20 MG tablet Take 1 tablet (20 mg total) by mouth daily.  . metoprolol succinate (TOPROL-XL) 25 MG 24 hr tablet Take 0.5 tablets (12.5 mg total) by mouth daily.  . Multiple Vitamin (MULTIVITAMIN) tablet Take 1 tablet by mouth daily.  . pantoprazole (PROTONIX) 40 MG tablet Take 40 mg by mouth 2 (two) times daily.  . polycarbophil (FIBERCON) 625 MG tablet Take 625 mg by mouth 2 (two) times daily.   . tamsulosin (FLOMAX) 0.4 MG CAPS capsule Take 0.4 mg by mouth daily.   . TRELEGY ELLIPTA 100-62.5-25 MCG/INH AEPB Inhale 1  puff into the lungs daily.  Marland Kitchen guaiFENesin-dextromethorphan (ROBITUSSIN DM) 100-10 MG/5ML syrup Take 5 mLs by mouth every 4 (four) hours as needed for cough. (Patient not taking: Reported on 04/15/2020)  . prochlorperazine (COMPAZINE) 10 MG tablet TAKE ONE TABLET BY MOUTH EVERY 6 HOURS AS NEEDED FOR NAUSEA OR VOMITING (Patient not taking: Reported on 04/15/2020)  . traMADol (ULTRAM) 50 MG tablet Take 50 mg by mouth 2 (two) times daily as needed.  . [DISCONTINUED] Durvalumab (IMFINZI IV) Inject into the vein every 14 (fourteen) days.   Facility-Administered Encounter Medications as of 04/15/2020  Medication  . sodium chloride flush (NS) 0.9 % injection 10 mL  . sodium chloride flush (NS) 0.9 % injection 10 mL    PHYSICAL EXAM:   Well nourished, pleasantly conversant, [place] in place. Independent in ambulation/transfers. Sister of D-I-L in the home  Sat 96% 3 LPM HR 101 Chest: clear, positive egophany LLL Ext: no LE edema, mild swelling LL to mid shin  Cardiac: RRR without MRG  Julianne Handler, NP

## 2020-04-20 DIAGNOSIS — J9621 Acute and chronic respiratory failure with hypoxia: Secondary | ICD-10-CM | POA: Diagnosis not present

## 2020-04-20 DIAGNOSIS — J439 Emphysema, unspecified: Secondary | ICD-10-CM | POA: Diagnosis not present

## 2020-04-20 DIAGNOSIS — Z48812 Encounter for surgical aftercare following surgery on the circulatory system: Secondary | ICD-10-CM | POA: Diagnosis not present

## 2020-04-20 DIAGNOSIS — I5023 Acute on chronic systolic (congestive) heart failure: Secondary | ICD-10-CM | POA: Diagnosis not present

## 2020-04-20 DIAGNOSIS — I110 Hypertensive heart disease with heart failure: Secondary | ICD-10-CM | POA: Diagnosis not present

## 2020-04-20 DIAGNOSIS — N179 Acute kidney failure, unspecified: Secondary | ICD-10-CM | POA: Diagnosis not present

## 2020-04-20 DIAGNOSIS — I2510 Atherosclerotic heart disease of native coronary artery without angina pectoris: Secondary | ICD-10-CM | POA: Diagnosis not present

## 2020-04-20 DIAGNOSIS — J8410 Pulmonary fibrosis, unspecified: Secondary | ICD-10-CM | POA: Diagnosis not present

## 2020-04-20 DIAGNOSIS — I0981 Rheumatic heart failure: Secondary | ICD-10-CM | POA: Diagnosis not present

## 2020-04-20 NOTE — Progress Notes (Signed)
[facility] [place]                                       Cardiology Office Note    Date:  04/22/2020   ID:  NALIN MAZZOCCO, DOB 01/24/1941, MRN [id number]  PCP:  [name], MD  Cardiologist: Minus Breeding, MD / Dr. Burt Knack & Dr. Cyndia Bent (TAVR)  CC: 1 month s/p TAVR  History of Present Illness:  Robert Dixon is a 79 y.o. male with a history of COPD on home 2-3 home 02, HTN, stage IIIb NSCLC s/p chemo/XRT and currently on immunotherapy, carotid artery disease (100% occluded RICA stenosis), GERD,1st deg AV block,pericardial effusion and severe AS s/p TAVR 03/25/20 who presents to clinic for follow up.   He was admitted 4/19-03/28/20 for acute respiratory failure and acute CHF. He was found to have severe AS. Cardiac catheterization demonstrated mild, diffuse non-obstructive coronary artery disease.  He underwent successful TAVR with a 26 mm Edwards Sapien 3 THV via the TF approach on 03/25/20. Post operative echo showed EF 30-35%, small stable pericardial effusion, normally functioning TAVR with a mean gradient of 7 mm Hg and no PVL. He was diuresed with IV lasix and placed on a BB due to cardiomyopathy and sinus tachycardia. He was noted to have significant left-sided pleural effusion on admission.  He underwent thoracentesis on 03/16/2020 with removal of 1170 mL. His effusion was noted to be a transudate based upon pleural fluid to serum albumin ratio. Cytology demonstrated reactive mesothelial cells and no malignant cells.  The patient developed recurrent bilateral pleural effusions. After undergoing TAVR, he was sent back to interventional radiology for repeat thoracentesis.  He had successful thoracentesis on the left with removal of 750 mL. The effusion on the right was not significant enough to proceed with thoracentesis. He was discharged home on aspirin and plavix.  He was seen by Dr. Percival Spanish on 04/01/20 and doing well. He was felt to have a  recurrent pleural effusion that needed to be monitored. No changes made to medications.   Today he presents to clinic for follow up. No CP. Has chronic SOB that's improved since his admission. Has chronic left LE edema, orthopnea or PND. No dizziness or syncope. No blood in stool or urine. No palpitations.     Past Medical History:  Diagnosis Date  . Acute on chronic respiratory failure with hypoxia ([place]) 03/16/2020  . Acute respiratory failure ([place]) 04/09/2013  . Carotid artery disease ([place]) 03/28/2020   Carotid US 02/2020:  R 100%; L 1-39%  . Chronic systolic CHF 25/42/7062   Echo 02/2020: EF 30-35, small pericardial effusion, trivial MR, status post TAVR with no PVL, mean gradient 7 mmHg  . Complication of anesthesia   . COPD (chronic obstructive pulmonary disease) ([place])   . GERD (gastroesophageal reflux disease)   . Hemoptysis 11/20/2018  . History of hiatal hernia   . Hypertension   . 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 Mediastinal adenopathy 12/06/2018  . Migraine with visual aura   . Non-small cell carcinoma of left lung, stage 3 ([place]) 12/19/2018  . Non-small cell lung cancer ([place]) dx'd 11/20/18  . Pericardial effusion 10/14/2019  . Pneumonia 11/20/2018  . PONV (postoperative nausea and vomiting)   . Primary malignant neoplasm of left upper lobe of lung ([place]) 12/12/2018  . Pulmonary nodule 11/20/2018  . S/P  TAVR (transcatheter aortic valve replacement) 03/25/2020  . Severe aortic stenosis 10/14/2019  . Skin cancer     Past Surgical History:  Procedure Laterality Date  . COLONOSCOPY W/ POLYPECTOMY    . IR IMAGING GUIDED PORT INSERTION  03/14/2019  . IR THORACENTESIS ASP PLEURAL SPACE W/IMG GUIDE  03/26/2020  . RIGHT HEART CATH AND CORONARY ANGIOGRAPHY N/A 03/19/2020   Procedure: RIGHT HEART CATH AND CORONARY ANGIOGRAPHY;  Surgeon: [name], MD;  Location: [facility];  Service: Cardiovascular;  Laterality: N/A;  . TEE WITHOUT CARDIOVERSION N/A 03/25/2020   Procedure:  TRANSESOPHAGEAL ECHOCARDIOGRAM (TEE);  Surgeon: [name], MD;  Location: [facility];  Service: Open Heart Surgery;  Laterality: N/A;  . TONSILLECTOMY    . TRANSCATHETER AORTIC VALVE REPLACEMENT, TRANSFEMORAL Left 03/25/2020   Procedure: TRANSCATHETER AORTIC VALVE REPLACEMENT, LEFT TRANSFEMORAL;  Surgeon: [name], MD;  Location: [facility];  Service: Open Heart Surgery;  Laterality: Left;  Marland Kitchen VASECTOMY    . VIDEO BRONCHOSCOPY WITH ENDOBRONCHIAL ULTRASOUND N/A 12/06/2018   Procedure: VIDEO BRONCHOSCOPY WITH ENDOBRONCHIAL ULTRASOUND;  Surgeon: Garner Nash, DO;  Location: [facility];  Service: Thoracic;  Laterality: N/A;    Current Medications: Outpatient Medications Prior to Visit  Medication Sig Dispense Refill  . acetaminophen (TYLENOL) 325 MG tablet Take 2 tablets (650 mg total) by mouth every 6 (six) hours as needed for mild pain (or Fever >/= 101).    Marland Kitchen albuterol (PROVENTIL) (2.5 MG/3ML) 0.083% nebulizer solution Take 3 mLs (2.5 mg total) by nebulization every 4 (four) hours. 360 mL 1  . albuterol (VENTOLIN HFA) 108 (90 Base) MCG/ACT inhaler Inhale 2 puffs into the lungs every 6 (six) hours as needed for wheezing or shortness of breath. 18 g 5  . alum & mag hydroxide-simeth (MAALOX/MYLANTA) 200-200-20 MG/5ML suspension Take 15 mLs by mouth every 6 (six) hours as needed for indigestion or heartburn. 355 mL 0  . ascorbic acid (VITAMIN C) 500 MG tablet Take 500 mg by mouth daily.    Marland Kitchen aspirin 81 MG chewable tablet Chew 1 tablet (81 mg total) by mouth daily.    Marland Kitchen atorvastatin (LIPITOR) 40 MG tablet Take 1 tablet (40 mg total) by mouth daily. 90 tablet 3  . clopidogrel (PLAVIX) 75 MG tablet Take 1 tablet (75 mg total) by mouth daily with breakfast. 90 tablet 3  . Cyanocobalamin (VITAMIN B 12 PO) Take 1 capsule by mouth daily. 2500 mcg    . dimenhyDRINATE (DRAMAMINE) 50 MG tablet Take 50 mg by mouth every 8 (eight) hours as needed for dizziness.     . docusate sodium  (COLACE) 100 MG capsule Take 100 mg by mouth daily as needed for mild constipation or moderate constipation.    . furosemide (LASIX) 20 MG tablet Take 1 tablet (20 mg total) by mouth daily. 90 tablet 3  . guaiFENesin-dextromethorphan (ROBITUSSIN DM) 100-10 MG/5ML syrup Take 5 mLs by mouth every 4 (four) hours as needed for cough. 118 mL 0  . metoprolol succinate (TOPROL-XL) 25 MG 24 hr tablet Take 0.5 tablets (12.5 mg total) by mouth daily. 45 tablet 3  . Multiple Vitamin (MULTIVITAMIN) tablet Take 1 tablet by mouth daily.    . pantoprazole (PROTONIX) 40 MG tablet Take 40 mg by mouth 2 (two) times daily.    . polycarbophil (FIBERCON) 625 MG tablet Take 625 mg by mouth 2 (two) times daily.     . prochlorperazine (COMPAZINE) 10 MG tablet TAKE ONE TABLET BY MOUTH EVERY  6 HOURS AS NEEDED FOR NAUSEA OR VOMITING 30 tablet 0  . tamsulosin (FLOMAX) 0.4 MG CAPS capsule Take 0.4 mg by mouth daily.     . traMADol (ULTRAM) 50 MG tablet Take 50 mg by mouth 2 (two) times daily as needed.    . TRELEGY ELLIPTA 100-62.5-25 MCG/INH AEPB Inhale 1 puff into the lungs daily.     Facility-Administered Medications Prior to Visit  Medication Dose Route Frequency Provider Last Rate Last Admin  . sodium chloride flush (NS) 0.9 % injection 10 mL  10 mL Intracatheter PRN [name], MD      . sodium chloride flush (NS) 0.9 % injection 10 mL  10 mL Intracatheter PRN [name], MD   10 mL at 05/20/19 1232     Allergies:   Patient has no known allergies.   Social History   Socioeconomic History  . Marital status: Widowed    Spouse name: Not on file  . Number of children: Not on file  . Years of education: Not on file  . Highest education level: Not on file  Occupational History  . Not on file  Tobacco Use  . Smoking status: Former Smoker    Years: 30.00    Types: Cigarettes    Quit date: 2000    Years since quitting: 21.4  . Smokeless tobacco: Never Used  Substance and Sexual Activity  . Alcohol  use: No  . Drug use: No  . Sexual activity: Not on file  Other Topics Concern  . Not on file  Social History Narrative   Lives alone.  Two sons.  Widower.     Social Determinants of Health   Financial Resource Strain:   . Difficulty of Paying Living Expenses:   Food Insecurity:   . Worried About Charity fundraiser in the Last Year:   . Arboriculturist in the Last Year:   Transportation Needs:   . Film/video editor (Medical):   Marland Kitchen Lack of Transportation (Non-Medical):   Physical Activity:   . Days of Exercise per Week:   . Minutes of Exercise per Session:   Stress:   . Feeling of Stress :   Social Connections:   . Frequency of Communication with Friends and Family:   . Frequency of Social Gatherings with Friends and Family:   . Attends Religious Services:   . Active Member of Clubs or Organizations:   . Attends Archivist Meetings:   Marland Kitchen Marital Status:      Family History:  The patient's family history includes CVA in his father; Cancer - Cervical in his sister; Cancer - Lung in his mother; Hypertension in his father.     ROS:   Please see the history of present illness.    ROS All other systems reviewed and are negative.   PHYSICAL EXAM:   VS:  BP (!) 110/58   Pulse (!) 101   Ht 5\' 8"  (1.727 m)   Wt 158 lb (71.7 kg)   SpO2 95%   BMI 24.02 kg/m    GEN: Well nourished, well developed, in no acute distress HEENT: normal Neck: no JVD or masses Cardiac: RRR; no murmurs, rubs, or gallops,no edema  Respiratory:  Diminished breath sounds on left otherwise CTA otherwise GI: soft, nontender, nondistended, + BS MS: no deformity or atrophy Skin: warm and dry, no rash Neuro:  Alert and Oriented x 3, Strength and sensation are intact Psych: euthymic mood, full affect   Wt  Readings from Last 3 Encounters:  04/21/20 158 lb (71.7 kg)  04/08/20 156 lb 12.8 oz (71.1 kg)  04/01/20 159 lb 9.6 oz (72.4 kg)      Studies/Labs Reviewed:   EKG:  EKG is NOT  ordered today.   Recent Labs: 03/09/2020: TSH 2.432 03/16/2020: B Natriuretic Peptide 910.9 03/26/2020: Magnesium 2.3 04/08/2020: ALT 17; BUN 24; Creatinine 1.17; Hemoglobin 9.4; Platelet Count 297; Potassium 4.4; Sodium 140   Lipid Panel    Component Value Date/Time   CHOL 116 03/17/2020 0526   TRIG 70 03/17/2020 0526   HDL 38 (L) 03/17/2020 0526   CHOLHDL 3.1 03/17/2020 0526   VLDL 14 03/17/2020 0526   LDLCALC 64 03/17/2020 0526    Additional studies/ records that were reviewed today include:  TAVR OPERATIVE NOTE   Date of Procedure:03/25/2020  Preoperative Diagnosis:Severe Aortic Stenosis   Postoperative Diagnosis:Same   Procedure:   Transcatheter Aortic Valve Replacement - PercutaneousLeftTransfemoral Approach Edwards Sapien 3 Ultra THV (size 5mm, model # 9750TFX, serial # [id number])  Co-Surgeons:Bryan Alveria Apley, MD and [name], MD    Anesthesiologist:Chris Ermalene Postin, MD  Echocardiographer:Peter Johnsie Cancel, MD  Pre-operative Echo Findings: ? Severe aortic stenosis ? Severeleft ventricular systolic dysfunction  Post-operative Echo Findings: ? Noparavalvular leak ? Unchanged Severeleft ventricular systolic dysfunction  __________________  Echo 03/26/20: IMPRESSIONS 1. Diffuse hypokinesis with septal and apical akinesis. Left ventricular  ejection fraction, by estimation, is 30 to 35%. The left ventricle has  moderately decreased function. The left ventricle demonstrates regional  wall motion abnormalities (see  scoring diagram/findings for description). The left ventricular internal  cavity size was mildly dilated. There is mild left ventricular  hypertrophy. Left ventricular diastolic parameters are indeterminate.  2. Right ventricular systolic function is normal. The right ventricular  size is normal.  3. Small pericardial  effusion seen most prominantly behind RA no change  from pre implant.  4. The mitral valve is degenerative. Trivial mitral valve regurgitation.  5. Post TAVR with 26 mm Sapien 3 Ultra with no PVL. Meand gradient 7 peak  13 mmHg and AVA 2.6 cm2. Although gradients higher than implant I suspect  implant gradients too low. Valve looks excellent. The aortic valve is  tricuspid. Aortic valve  regurgitation is not visualized.   ______________________   04/21/20 IMPRESSIONS  1. Left ventricular ejection fraction, by estimation, is 30 to 35%. The  left ventricle has moderately decreased function. Left ventricular  diastolic parameters are indeterminate.  2. Right ventricular systolic function is normal. The right ventricular  size is normal. There is normal pulmonary artery systolic pressure. The  estimated right ventricular systolic pressure is 16.1 mmHg.  3. Left atrial size was mildly dilated.  4. The mitral valve is normal in structure. Trivial mitral valve  regurgitation.  5. The inferior vena cava is normal in size with greater than 50%  respiratory variability, suggesting right atrial pressure of 3 mmHg.  6. The aortic valve has been repaired/replaced. There is a 26 mm Edwards  Sapien prosthetic, stented (TAVR) valve present in the aortic position,  Aortic valve regurgitation is not visualized. Mean gradient 8 mmHg. Echo  findings are consistent with normal  structure and function of the aortic valve prosthesis.  7. Moderate pericardial effusion. Measures up to 1.3 cm adjacent to RV  free wall. Similar to prior echoes, RA inversion is seen. There is also  significant tricuspid inflow respiratory variation. However, no RV  diastolic collapse is seen, there is not significant mitral inflow respiratory variation,  and IVC is small/collapsible, which suggests no tamponade.   _____________________   CXR 04/22/20 IMPRESSION: 1.  PowerPort catheter stable position.  2. Prior  cardiac valve replacement. Stable cardiomegaly. No pulmonary venous congestion.  3. Persistent left base atelectasis. Slight increase in left-sided pleural effusion. No pneumothorax.   ASSESSMENT & PLAN:   Severe LFLG AS s/p TAVR:doing well 1 month out from TAVR. Echo today shows EF 30-35%, normally functioning TAVR with a mean gradient of 8 mmHg and no PVL. There is a small pericardial effusion but no sign of tamponade (similar to previous studies). Also left pleural effusion. He has NYHA class II symptoms, with dyspnea mostly related to lung disease. SBE prophylaxis discussed; I Rx'd amoxicillin. Continue on ASA and Plavix. Plavix can be discontinued after 6 months of therapy (08/2020).  Left pleural effusion:  diminished breath sounds on left. Echo shows recurrent left pleural effusion. I ordered a CXR which showed slightly enlarging left pleural effusion. I discussed with Wyn Quaker NP at [facility] who thinks it may require repeat thoracentesis. He will talk with Dr. Tamala Julian and [name] and arrange through their office.   Chronic systolic CHF: appears euvolemic. Continue on lasix 20mg  daily.   Carotid artery disease: 100% occluded RICA. 5-52% LICA stenosis. Continue medical therapy.   Medication Adjustments/Labs and Tests Ordered: Current medicines are reviewed at length with the patient today.  Concerns regarding medicines are outlined above.  Medication changes, Labs and Tests ordered today are listed in the Patient Instructions below. Patient Instructions  Medication Instructions:  1) you may STOP PLAVIX 09/24/2020 2) Your provider discussed the importance of taking an antibiotic prior to all dental visits to prevent damage to the heart valves from infection. You were given a prescription for AMOXIL 2,000 mg to take one hour prior to any dental appointment.  *If you need a refill on your cardiac medications before your next appointment, please call your  pharmacy*  Testing/Procedures: Joellen Jersey recommends you have a CHEST XRAY.  Please proceed to [facility] [address] [facility] [address], [place], [place] [address] You do not need an appointment. Walk-in hours are 8AM to 5PM.   Follow-Up: We will call you to arrange your 1 year TAVR echo and office visit.    Mable Fill, PA-C  04/22/2020 11:00 AM    [facility] [address], [place], [place]  [address] Phone: [phone]; Fax: [phone]

## 2020-04-21 ENCOUNTER — Ambulatory Visit
Admission: RE | Admit: 2020-04-21 | Discharge: 2020-04-21 | Disposition: A | Discharge status: Home / Self Care | Payer: Medicare HMO | Source: Ambulatory Visit | Attending: Physician Assistant | Admitting: Physician Assistant

## 2020-04-21 ENCOUNTER — Other Ambulatory Visit: Payer: Self-pay

## 2020-04-21 ENCOUNTER — Ambulatory Visit (HOSPITAL_COMMUNITY): Payer: Medicare HMO | Attending: Cardiology

## 2020-04-21 ENCOUNTER — Ambulatory Visit (INDEPENDENT_AMBULATORY_CARE_PROVIDER_SITE_OTHER): Payer: Medicare HMO | Admitting: Physician Assistant

## 2020-04-21 VITALS — BP 110/58 | HR 101 | Ht 68.0 in | Wt 158.0 lb

## 2020-04-21 DIAGNOSIS — I5022 Chronic systolic (congestive) heart failure: Secondary | ICD-10-CM | POA: Diagnosis not present

## 2020-04-21 DIAGNOSIS — J90 Pleural effusion, not elsewhere classified: Secondary | ICD-10-CM | POA: Diagnosis not present

## 2020-04-21 DIAGNOSIS — I350 Nonrheumatic aortic (valve) stenosis: Secondary | ICD-10-CM

## 2020-04-21 DIAGNOSIS — J9811 Atelectasis: Secondary | ICD-10-CM | POA: Diagnosis not present

## 2020-04-21 DIAGNOSIS — Z952 Presence of prosthetic heart valve: Secondary | ICD-10-CM

## 2020-04-21 DIAGNOSIS — I6529 Occlusion and stenosis of unspecified carotid artery: Secondary | ICD-10-CM

## 2020-04-21 MED ORDER — AMOXICILLIN 500 MG PO TABS
ORAL_TABLET | ORAL | 12 refills | Status: DC
Start: 2020-04-21 — End: 2020-06-21

## 2020-04-21 NOTE — Patient Instructions (Signed)
Medication Instructions:  1) you may STOP PLAVIX 09/24/2020 2) Your provider discussed the importance of taking an antibiotic prior to all dental visits to prevent damage to the heart valves from infection. You were given a prescription for AMOXIL 2,000 mg to take one hour prior to any dental appointment.  *If you need a refill on your cardiac medications before your next appointment, please call your pharmacy*  Testing/Procedures: Joellen Jersey recommends you have a CHEST XRAY.  Please proceed to [facility] [address], [place], [place] [address] You do not need an appointment. Walk-in hours are 8AM to 5PM.   Follow-Up: We will call you to arrange your 1 year TAVR echo and office visit.

## 2020-04-23 ENCOUNTER — Telehealth: Payer: Self-pay | Admitting: Physician Assistant

## 2020-04-23 NOTE — Telephone Encounter (Signed)
Noted. Thanks for working with the patient.  Aaron Edelman

## 2020-04-23 NOTE — Telephone Encounter (Signed)
  [facility]   MULTIDISCIPLINARY HEART VALVE TEAM  Recent CXR showed recurrent left pleural effusion. I spoke with Dr. Tamala Julian and Wyn Quaker NP. Per Dr. Tamala Julian,   "If his breathing is improved with drainage we can tap.  If no difference in breathing after tap do not see utility in draining.  He has multiple negative cytologies.  Okay to schedule in endo or office depending on availabilities."   I spoke with patient who feels his breathing is stable and he did not feel significantly improved after previous taps. We will continue with a watch and wait approach. He will contact the pulm office if he feels like his breathing is worsening and thinks he may need repeat thoracentesis. Otherwise, he will keep his regularly scheduled follow up with Wyn Quaker NP in July.   Angelena Form PA-C  [facility]

## 2020-04-27 DIAGNOSIS — I2510 Atherosclerotic heart disease of native coronary artery without angina pectoris: Secondary | ICD-10-CM | POA: Diagnosis not present

## 2020-04-27 DIAGNOSIS — J8410 Pulmonary fibrosis, unspecified: Secondary | ICD-10-CM | POA: Diagnosis not present

## 2020-04-27 DIAGNOSIS — Z48812 Encounter for surgical aftercare following surgery on the circulatory system: Secondary | ICD-10-CM | POA: Diagnosis not present

## 2020-04-27 DIAGNOSIS — J9621 Acute and chronic respiratory failure with hypoxia: Secondary | ICD-10-CM | POA: Diagnosis not present

## 2020-04-27 DIAGNOSIS — I5023 Acute on chronic systolic (congestive) heart failure: Secondary | ICD-10-CM | POA: Diagnosis not present

## 2020-04-27 DIAGNOSIS — I110 Hypertensive heart disease with heart failure: Secondary | ICD-10-CM | POA: Diagnosis not present

## 2020-04-27 DIAGNOSIS — J439 Emphysema, unspecified: Secondary | ICD-10-CM | POA: Diagnosis not present

## 2020-04-27 DIAGNOSIS — I0981 Rheumatic heart failure: Secondary | ICD-10-CM | POA: Diagnosis not present

## 2020-04-27 DIAGNOSIS — N179 Acute kidney failure, unspecified: Secondary | ICD-10-CM | POA: Diagnosis not present

## 2020-04-28 DIAGNOSIS — J439 Emphysema, unspecified: Secondary | ICD-10-CM | POA: Diagnosis not present

## 2020-05-01 DIAGNOSIS — J432 Centrilobular emphysema: Secondary | ICD-10-CM | POA: Diagnosis not present

## 2020-05-01 DIAGNOSIS — J9600 Acute respiratory failure, unspecified whether with hypoxia or hypercapnia: Secondary | ICD-10-CM | POA: Diagnosis not present

## 2020-05-01 DIAGNOSIS — J90 Pleural effusion, not elsewhere classified: Secondary | ICD-10-CM | POA: Diagnosis not present

## 2020-05-01 DIAGNOSIS — C3412 Malignant neoplasm of upper lobe, left bronchus or lung: Secondary | ICD-10-CM | POA: Diagnosis not present

## 2020-05-01 DIAGNOSIS — J9611 Chronic respiratory failure with hypoxia: Secondary | ICD-10-CM | POA: Diagnosis not present

## 2020-05-01 DIAGNOSIS — C3492 Malignant neoplasm of unspecified part of left bronchus or lung: Secondary | ICD-10-CM | POA: Diagnosis not present

## 2020-05-01 DIAGNOSIS — J439 Emphysema, unspecified: Secondary | ICD-10-CM | POA: Diagnosis not present

## 2020-05-10 ENCOUNTER — Encounter: Payer: Self-pay | Admitting: Internal Medicine

## 2020-05-20 ENCOUNTER — Inpatient Hospital Stay: Payer: Medicare HMO | Attending: Internal Medicine

## 2020-05-20 ENCOUNTER — Other Ambulatory Visit: Payer: Self-pay

## 2020-05-20 DIAGNOSIS — Z95828 Presence of other vascular implants and grafts: Secondary | ICD-10-CM

## 2020-05-20 DIAGNOSIS — C3432 Malignant neoplasm of lower lobe, left bronchus or lung: Secondary | ICD-10-CM | POA: Insufficient documentation

## 2020-05-20 DIAGNOSIS — Z452 Encounter for adjustment and management of vascular access device: Secondary | ICD-10-CM | POA: Insufficient documentation

## 2020-05-20 MED ORDER — SODIUM CHLORIDE 0.9% FLUSH
10.0000 mL | INTRAVENOUS | Status: DC | PRN
  Administered 2020-05-20: 10 mL
  Filled 2020-05-20: qty 10

## 2020-05-20 MED ORDER — HEPARIN SOD (PORK) LOCK FLUSH 100 UNIT/ML IV SOLN
500.0000 [IU] | Freq: Once | INTRAVENOUS | Status: AC | PRN
  Administered 2020-05-20: 500 [IU]
  Filled 2020-05-20: qty 5

## 2020-05-24 ENCOUNTER — Other Ambulatory Visit: Payer: Self-pay | Admitting: Family Medicine

## 2020-05-24 ENCOUNTER — Ambulatory Visit
Admission: RE | Admit: 2020-05-24 | Discharge: 2020-05-24 | Disposition: A | Discharge status: Home / Self Care | Payer: Medicare HMO | Source: Ambulatory Visit | Attending: Family Medicine | Admitting: Family Medicine

## 2020-05-24 DIAGNOSIS — Z8709 Personal history of other diseases of the respiratory system: Secondary | ICD-10-CM

## 2020-05-24 DIAGNOSIS — R0600 Dyspnea, unspecified: Secondary | ICD-10-CM

## 2020-05-24 DIAGNOSIS — J181 Lobar pneumonia, unspecified organism: Secondary | ICD-10-CM | POA: Diagnosis not present

## 2020-05-24 DIAGNOSIS — J90 Pleural effusion, not elsewhere classified: Secondary | ICD-10-CM | POA: Diagnosis not present

## 2020-05-24 DIAGNOSIS — D692 Other nonthrombocytopenic purpura: Secondary | ICD-10-CM | POA: Diagnosis not present

## 2020-05-24 DIAGNOSIS — N400 Enlarged prostate without lower urinary tract symptoms: Secondary | ICD-10-CM | POA: Diagnosis not present

## 2020-05-24 DIAGNOSIS — R454 Irritability and anger: Secondary | ICD-10-CM | POA: Diagnosis not present

## 2020-05-25 ENCOUNTER — Telehealth: Payer: Self-pay | Admitting: Internal Medicine

## 2020-05-25 ENCOUNTER — Telehealth: Payer: Self-pay

## 2020-05-25 ENCOUNTER — Encounter: Payer: Self-pay | Admitting: Internal Medicine

## 2020-05-25 NOTE — Telephone Encounter (Signed)
PROGRESS NOTE: Per patient request sent over schedule message to reschedule patients LAB and FLUSH appointment to be on 8/2 before his scheduled CT.asked to keep doctors appointment on 8/5. Schedule message sent.

## 2020-05-25 NOTE — Telephone Encounter (Signed)
Called pt per 6/29 sch message - pt is aware of lab and flush change.

## 2020-05-28 ENCOUNTER — Other Ambulatory Visit: Payer: Self-pay | Admitting: *Deleted

## 2020-05-28 DIAGNOSIS — J439 Emphysema, unspecified: Secondary | ICD-10-CM | POA: Diagnosis not present

## 2020-05-28 MED ORDER — ALBUTEROL SULFATE (2.5 MG/3ML) 0.083% IN NEBU: 2.5000 mg | INHALATION_SOLUTION | RESPIRATORY_TRACT | 1 refills | Status: DC

## 2020-05-31 DIAGNOSIS — J9600 Acute respiratory failure, unspecified whether with hypoxia or hypercapnia: Secondary | ICD-10-CM | POA: Diagnosis not present

## 2020-05-31 DIAGNOSIS — J432 Centrilobular emphysema: Secondary | ICD-10-CM | POA: Diagnosis not present

## 2020-05-31 DIAGNOSIS — J90 Pleural effusion, not elsewhere classified: Secondary | ICD-10-CM | POA: Diagnosis not present

## 2020-05-31 DIAGNOSIS — C3412 Malignant neoplasm of upper lobe, left bronchus or lung: Secondary | ICD-10-CM | POA: Diagnosis not present

## 2020-05-31 DIAGNOSIS — J439 Emphysema, unspecified: Secondary | ICD-10-CM | POA: Diagnosis not present

## 2020-05-31 DIAGNOSIS — J9611 Chronic respiratory failure with hypoxia: Secondary | ICD-10-CM | POA: Diagnosis not present

## 2020-05-31 DIAGNOSIS — C3492 Malignant neoplasm of unspecified part of left bronchus or lung: Secondary | ICD-10-CM | POA: Diagnosis not present

## 2020-06-02 ENCOUNTER — Other Ambulatory Visit: Payer: Self-pay | Admitting: *Deleted

## 2020-06-02 ENCOUNTER — Other Ambulatory Visit: Payer: Self-pay

## 2020-06-02 MED ORDER — METOPROLOL SUCCINATE ER 25 MG PO TB24: 12.5000 mg | ORAL_TABLET | Freq: Every day | ORAL | 3 refills | Status: DC

## 2020-06-02 MED ORDER — TRELEGY ELLIPTA 100-62.5-25 MCG/INH IN AEPB: 1.0000 | INHALATION_SPRAY | Freq: Every day | RESPIRATORY_TRACT | 2 refills | Status: DC

## 2020-06-02 MED ORDER — FUROSEMIDE 20 MG PO TABS: 20.0000 mg | ORAL_TABLET | Freq: Every day | ORAL | 3 refills | Status: DC

## 2020-06-02 MED ORDER — ATORVASTATIN CALCIUM 40 MG PO TABS: 40.0000 mg | ORAL_TABLET | Freq: Every day | ORAL | 3 refills | Status: DC

## 2020-06-02 MED ORDER — CLOPIDOGREL BISULFATE 75 MG PO TABS: 75.0000 mg | ORAL_TABLET | Freq: Every day | ORAL | 3 refills | Status: DC

## 2020-06-03 NOTE — Progress Notes (Signed)
Cardiology Office Note   Date:  06/04/2020   ID:  Robert Dixon, DOB 1941-09-28, MRN [id number]  PCP:  [name], MD  Cardiologist:   Minus Breeding, MD Referring:  [name], MD    No chief complaint on file.     History of Present Illness: Robert Dixon is a 79 y.o. male who is referred by [name], MD for evaluation of aortic stenosis.  He has non-small cell lung cancer.  He has been treated with carboplatin and paclitaxel.  He is on Imfinzi.  He has O2 dependent lung disease and emphysema reported.  He has had a thoracentesis that looked to be inflammatory.  I saw him last time because was found to have pericardial effusion and aortic stenosis on an echocardiogram.  He has well-preserved ejection fraction.  The effusion was moderate.  His aortic stenosis was severe.  We were deciding how to mange this and waiting to have more resolution of his cancer.  However, he presented with acute HF and was noted to have a reduced EF.  He had pleural effusions and he underwent thoracentesis.  He had TAVR.   Of note his pericardial effusion was now small.    Since I last saw him he has had no new complaints.  Is very fatigued.  He says I feel "mediocre."  He is on 3 L chronically.  His weight has been stable.  He is not having any new chest pressure, neck or arm discomfort.  Is not having any new shortness of breath, PND or orthopnea.  His biggest complaint is he has a metallic sensation in his stomach and a vague discomfort.  He is forcing himself to take however he is in the middle.  He does have some leg cramping periodically.  Past Medical History:  Diagnosis Date  . Acute on chronic respiratory failure with hypoxia ([place]) 03/16/2020  . Acute respiratory failure ([place]) 04/09/2013  . Carotid artery disease ([place]) 03/28/2020   Carotid US 02/2020:  R 100%; L 1-39%  . Chronic systolic CHF 30/16/0109   Echo 02/2020: EF 30-35, small pericardial effusion, trivial MR, status post TAVR  with no PVL, mean gradient 7 mmHg  . Complication of anesthesia   . COPD (chronic obstructive pulmonary disease) ([place])   . GERD (gastroesophageal reflux disease)   . Hemoptysis 11/20/2018  . History of hiatal hernia   . Hypertension   . Legionnaire's disease ([place]) 2014  . Mediastinal adenopathy 12/06/2018  . Migraine with visual aura   . Non-small cell carcinoma of left lung, stage 3 ([place]) 12/19/2018  . Non-small cell lung cancer ([place]) dx'd 11/20/18  . Pericardial effusion 10/14/2019  . Pneumonia 11/20/2018  . PONV (postoperative nausea and vomiting)   . Primary malignant neoplasm of left upper lobe of lung ([place]) 12/12/2018  . Pulmonary nodule 11/20/2018  . 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 Severe aortic stenosis 10/14/2019  . Skin cancer     Past Surgical History:  Procedure Laterality Date  . COLONOSCOPY W/ POLYPECTOMY    . IR IMAGING GUIDED PORT INSERTION  03/14/2019  . IR THORACENTESIS ASP PLEURAL SPACE W/IMG GUIDE  03/26/2020  . RIGHT HEART CATH AND CORONARY ANGIOGRAPHY N/A 03/19/2020   Procedure: RIGHT HEART CATH AND CORONARY ANGIOGRAPHY;  Surgeon: [name], MD;  Location: [facility];  Service: Cardiovascular;  Laterality: N/A;  . TEE WITHOUT CARDIOVERSION N/A 03/25/2020   Procedure: TRANSESOPHAGEAL ECHOCARDIOGRAM (TEE);  Surgeon: [name], MD;  Location: [facility];  Service: Open Heart Surgery;  Laterality: N/A;  . TONSILLECTOMY    . TRANSCATHETER AORTIC VALVE REPLACEMENT, TRANSFEMORAL Left 03/25/2020   Procedure: TRANSCATHETER AORTIC VALVE REPLACEMENT, LEFT TRANSFEMORAL;  Surgeon: [name], MD;  Location: [facility];  Service: Open Heart Surgery;  Laterality: Left;  Marland Kitchen VASECTOMY    . VIDEO BRONCHOSCOPY WITH ENDOBRONCHIAL ULTRASOUND N/A 12/06/2018   Procedure: VIDEO BRONCHOSCOPY WITH ENDOBRONCHIAL ULTRASOUND;  Surgeon: Garner Nash, DO;  Location: [facility];  Service: Thoracic;  Laterality: N/A;      Current Outpatient Medications  Medication Sig Dispense Refill  . acetaminophen (TYLENOL) 325 MG tablet Take 2 tablets (650 mg total) by mouth every 6 (six) hours as needed for mild pain (or Fever >/= 101).    Marland Kitchen albuterol (PROVENTIL) (2.5 MG/3ML) 0.083% nebulizer solution Take 3 mLs (2.5 mg total) by nebulization every 4 (four) hours. 360 mL 1  . albuterol (VENTOLIN HFA) 108 (90 Base) MCG/ACT inhaler Inhale 2 puffs into the lungs every 6 (six) hours as needed for wheezing or shortness of breath. 18 g 5  . alum & mag hydroxide-simeth (MAALOX/MYLANTA) 200-200-20 MG/5ML suspension Take 15 mLs by mouth every 6 (six) hours as needed for indigestion or heartburn. 355 mL 0  . amoxicillin (AMOXIL) 500 MG tablet Take 4 capsules (2,000 mg) one hour prior to all dental visits. 8 tablet 12  . ascorbic acid (VITAMIN C) 500 MG tablet Take 500 mg by mouth daily.    Marland Kitchen aspirin 81 MG chewable tablet Chew 1 tablet (81 mg total) by mouth daily.    Marland Kitchen atorvastatin (LIPITOR) 40 MG tablet Take 1 tablet (40 mg total) by mouth daily. 90 tablet 3  . clopidogrel (PLAVIX) 75 MG tablet Take 1 tablet (75 mg total) by mouth daily with breakfast. 90 tablet 3  . Cyanocobalamin (VITAMIN B 12 PO) Take 1 capsule by mouth daily. 2500 mcg    . dimenhyDRINATE (DRAMAMINE) 50 MG tablet Take 50 mg by mouth every 8 (eight) hours as needed for dizziness.     . docusate sodium (COLACE) 100 MG capsule Take 100 mg by mouth daily as needed for mild constipation or moderate constipation.    . furosemide (LASIX) 20 MG tablet Take 1 tablet (20 mg total) by mouth daily. 90 tablet 3  . guaiFENesin-dextromethorphan (ROBITUSSIN DM) 100-10 MG/5ML syrup Take 5 mLs by mouth every 4 (four) hours as needed for cough. 118 mL 0  . metoprolol succinate (TOPROL-XL) 25 MG 24 hr tablet Take 0.5 tablets (12.5 mg total) by mouth daily. 45 tablet 3  . Multiple Vitamin (MULTIVITAMIN) tablet Take 1 tablet by mouth daily.    . pantoprazole (PROTONIX) 40 MG  tablet Take 40 mg by mouth 2 (two) times daily.    . polycarbophil (FIBERCON) 625 MG tablet Take 625 mg by mouth 2 (two) times daily.     . prochlorperazine (COMPAZINE) 10 MG tablet TAKE ONE TABLET BY MOUTH EVERY 6 HOURS AS NEEDED FOR NAUSEA OR VOMITING 30 tablet 0  . tamsulosin (FLOMAX) 0.4 MG CAPS capsule Take 0.4 mg by mouth daily.     . traMADol (ULTRAM) 50 MG tablet Take 50 mg by mouth 2 (two) times daily as needed.    . TRELEGY ELLIPTA 100-62.5-25 MCG/INH AEPB Inhale 1 puff into the lungs daily. 60 each 2   No current facility-administered medications for this visit.   Facility-Administered Medications Ordered in Other Visits  Medication Dose Route Frequency Provider Last Rate  Last Admin  . sodium chloride flush (NS) 0.9 % injection 10 mL  10 mL Intracatheter PRN [name], MD      . sodium chloride flush (NS) 0.9 % injection 10 mL  10 mL Intracatheter PRN [name], MD   10 mL at 05/20/19 1232    Allergies:   Patient has no known allergies.    ROS:  Please see the history of present illness.   Otherwise, review of systems are positive for none.   All other systems are reviewed and negative.    PHYSICAL EXAM: VS:  BP 98/60   Pulse 89   Temp 97.7 F (36.5 C)   Ht 5\' 8"  (1.727 m)   Wt 156 lb 6.4 oz (70.9 kg)   SpO2 100%   BMI 23.78 kg/m  , BMI Body mass index is 23.78 kg/m. GEN:  No distress, frail appearing NECK:  No jugular venous distention at 90 degrees, waveform within normal limits, carotid upstroke brisk and symmetric, no bruits, no thyromegaly LYMPHATICS:  No cervical adenopathy LUNGS:  Clear to auscultation bilaterally BACK:  No CVA tenderness CHEST: Decreased breath sounds with dullness to percussion of the left base about one third up HEART:  S1 and S2 within normal limits, no S3, no S4, no clicks, no rubs, no murmurs ABD:  Positive bowel sounds normal in frequency in pitch, no bruits, no rebound, no guarding, unable to assess midline mass or bruit  with the patient seated. EXT:  2 plus pulses throughout, moderate edema, no cyanosis no clubbing SKIN:  No rashes no nodules NEURO:  Cranial nerves II through XII grossly intact, motor grossly intact throughout PSYCH:  Cognitively intact, oriented to person place and time   EKG:  EKG is not ordered today.   Recent Labs: 03/09/2020: TSH 2.432 03/16/2020: B Natriuretic Peptide 910.9 03/26/2020: Magnesium 2.3 04/08/2020: ALT 17; BUN 24; Creatinine 1.17; Hemoglobin 9.4; Platelet Count 297; Potassium 4.4; Sodium 140    Lipid Panel    Component Value Date/Time   CHOL 116 03/17/2020 0526   TRIG 70 03/17/2020 0526   HDL 38 (L) 03/17/2020 0526   CHOLHDL 3.1 03/17/2020 0526   VLDL 14 03/17/2020 0526   LDLCALC 64 03/17/2020 0526      Wt Readings from Last 3 Encounters:  06/04/20 156 lb 6.4 oz (70.9 kg)  04/21/20 158 lb (71.7 kg)  04/08/20 156 lb 12.8 oz (71.1 kg)      Other studies Reviewed: Additional studies/ records that were reviewed today include: None Review of the above records demonstrates: See elsewhere   ASSESSMENT AND PLAN:  Status post TAVR:  He will stop Plavix in October.  He also has follow-up echo and we will check on the scheduling of this.   PERICARDIAL EFFUSION:   This is small and will recheck again when he has his echo and follow-up.   CHRONIC SYSTOLIC HF: We are unable to titrate his meds with his low blood pressure.  He seems to be relatively euvolemic.   PLEURAL EFFUSION:   This was stable on CXR in late June.    HTN: Blood pressures are running low.  No change in therapy however.  He seems to tolerate this.   CAROTID STENOSIS:  He has an occluded left internal carotid artery and mild disease on the right.  This will be checked in April.    Current medicines are reviewed at length with the patient today.  The patient does not have concerns regarding medicines.  The  following changes have been made:  None  Labs/ tests ordered today include:  None  No orders of the defined types were placed in this encounter.    Disposition:   FU with me in six months.   Signed, Minus Breeding, MD  06/04/2020 10:01 AM    [facility]

## 2020-06-04 ENCOUNTER — Encounter: Payer: Self-pay | Admitting: Cardiology

## 2020-06-04 ENCOUNTER — Other Ambulatory Visit: Payer: Self-pay

## 2020-06-04 ENCOUNTER — Ambulatory Visit (INDEPENDENT_AMBULATORY_CARE_PROVIDER_SITE_OTHER): Payer: Medicare HMO | Admitting: Cardiology

## 2020-06-04 VITALS — BP 98/60 | HR 89 | Temp 97.7°F | Ht 68.0 in | Wt 156.4 lb

## 2020-06-04 DIAGNOSIS — I5022 Chronic systolic (congestive) heart failure: Secondary | ICD-10-CM

## 2020-06-04 DIAGNOSIS — I10 Essential (primary) hypertension: Secondary | ICD-10-CM

## 2020-06-04 DIAGNOSIS — I313 Pericardial effusion (noninflammatory): Secondary | ICD-10-CM | POA: Diagnosis not present

## 2020-06-04 DIAGNOSIS — Z952 Presence of prosthetic heart valve: Secondary | ICD-10-CM

## 2020-06-04 DIAGNOSIS — I3139 Other pericardial effusion (noninflammatory): Secondary | ICD-10-CM

## 2020-06-04 MED ORDER — ALBUTEROL SULFATE (2.5 MG/3ML) 0.083% IN NEBU: 2.5000 mg | INHALATION_SOLUTION | RESPIRATORY_TRACT | 1 refills | Status: DC

## 2020-06-04 MED ORDER — TRELEGY ELLIPTA 100-62.5-25 MCG/INH IN AEPB: 1.0000 | INHALATION_SPRAY | Freq: Every day | RESPIRATORY_TRACT | 1 refills | Status: DC

## 2020-06-04 NOTE — Patient Instructions (Signed)
Medication Instructions:  No changes *If you need a refill on your cardiac medications before your next appointment, please call your pharmacy*   Lab Work: None ordered If you have labs (blood work) drawn today and your tests are completely normal, you will receive your results only by: Marland Kitchen MyChart Message (if you have MyChart) OR . A paper copy in the mail If you have any lab test that is abnormal or we need to change your treatment, we will call you to review the results.   Testing/Procedures: None ordered   Follow-Up: At [facility], you and your health needs are our priority.  As part of our continuing mission to provide you with exceptional heart care, we have created designated Provider Care Teams.  These Care Teams include your primary Cardiologist (physician) and Advanced Practice Providers (APPs -  Physician Assistants and Nurse Practitioners) who all work together to provide you with the care you need, when you need it.  We recommend signing up for the patient portal called "MyChart".  Sign up information is provided on this After Visit Summary.  MyChart is used to connect with patients for Virtual Visits (Telemedicine).  Patients are able to view lab/test results, encounter notes, upcoming appointments, etc.  Non-urgent messages can be sent to your provider as well.   To learn more about what you can do with MyChart, go to [email or web address].    Your next appointment:   6 month(s)  The format for your next appointment:   In Person  Provider:   You may see Minus Breeding, MD or one of the following Advanced Practice Providers on your designated Care Team:    Rosaria Ferries, PA-C  Jory Sims, DNP, ANP  Cadence Kathlen Mody, [place]

## 2020-06-07 ENCOUNTER — Other Ambulatory Visit: Payer: Self-pay

## 2020-06-07 MED ORDER — ATORVASTATIN CALCIUM 40 MG PO TABS: 40.0000 mg | ORAL_TABLET | Freq: Every day | ORAL | 3 refills | Status: DC

## 2020-06-07 MED ORDER — METOPROLOL SUCCINATE ER 25 MG PO TB24: 12.5000 mg | ORAL_TABLET | Freq: Every day | ORAL | 3 refills | Status: DC

## 2020-06-09 ENCOUNTER — Other Ambulatory Visit (HOSPITAL_COMMUNITY): Payer: Medicare HMO

## 2020-06-10 ENCOUNTER — Other Ambulatory Visit: Payer: Self-pay | Admitting: Pulmonary Disease

## 2020-06-10 MED ORDER — TRELEGY ELLIPTA 100-62.5-25 MCG/INH IN AEPB: 1.0000 | INHALATION_SPRAY | Freq: Every day | RESPIRATORY_TRACT | 1 refills | Status: DC

## 2020-06-10 MED ORDER — ALBUTEROL SULFATE (2.5 MG/3ML) 0.083% IN NEBU: 2.5000 mg | INHALATION_SOLUTION | RESPIRATORY_TRACT | 5 refills | Status: AC

## 2020-06-17 ENCOUNTER — Ambulatory Visit: Payer: Medicare HMO | Admitting: Pulmonary Disease

## 2020-06-18 ENCOUNTER — Ambulatory Visit: Payer: Medicare HMO | Admitting: Primary Care

## 2020-06-18 ENCOUNTER — Other Ambulatory Visit: Payer: Self-pay | Admitting: Pulmonary Disease

## 2020-06-18 DIAGNOSIS — C3492 Malignant neoplasm of unspecified part of left bronchus or lung: Secondary | ICD-10-CM

## 2020-06-21 ENCOUNTER — Encounter: Payer: Self-pay | Admitting: Pulmonary Disease

## 2020-06-21 ENCOUNTER — Ambulatory Visit (INDEPENDENT_AMBULATORY_CARE_PROVIDER_SITE_OTHER): Payer: Medicare HMO | Admitting: Pulmonary Disease

## 2020-06-21 ENCOUNTER — Other Ambulatory Visit: Payer: Self-pay

## 2020-06-21 ENCOUNTER — Encounter: Payer: Self-pay | Admitting: Gastroenterology

## 2020-06-21 VITALS — BP 110/64 | HR 94 | Temp 98.0°F | Ht 66.0 in | Wt 156.0 lb

## 2020-06-21 DIAGNOSIS — J9611 Chronic respiratory failure with hypoxia: Secondary | ICD-10-CM | POA: Diagnosis not present

## 2020-06-21 DIAGNOSIS — R630 Anorexia: Secondary | ICD-10-CM

## 2020-06-21 DIAGNOSIS — Z79899 Other long term (current) drug therapy: Secondary | ICD-10-CM | POA: Diagnosis not present

## 2020-06-21 DIAGNOSIS — C3492 Malignant neoplasm of unspecified part of left bronchus or lung: Secondary | ICD-10-CM

## 2020-06-21 DIAGNOSIS — D6481 Anemia due to antineoplastic chemotherapy: Secondary | ICD-10-CM

## 2020-06-21 DIAGNOSIS — Z7189 Other specified counseling: Secondary | ICD-10-CM

## 2020-06-21 DIAGNOSIS — T451X5A Adverse effect of antineoplastic and immunosuppressive drugs, initial encounter: Secondary | ICD-10-CM

## 2020-06-21 DIAGNOSIS — J432 Centrilobular emphysema: Secondary | ICD-10-CM

## 2020-06-21 LAB — PULMONARY FUNCTION TEST
DL/VA % pred: 29 %
DL/VA: 1.16 ml/min/mmHg/L
DLCO cor % pred: 21 %
DLCO cor: 4.58 ml/min/mmHg
DLCO unc % pred: 21 %
DLCO unc: 4.58 ml/min/mmHg
FEF 25-75 Post: 0.76 L/sec
FEF 25-75 Pre: 0.74 L/sec
FEF2575-%Change-Post: 3 %
FEF2575-%Pred-Post: 45 %
FEF2575-%Pred-Pre: 44 %
FEV1-%Change-Post: -3 %
FEV1-%Pred-Post: 59 %
FEV1-%Pred-Pre: 61 %
FEV1-Post: 1.44 L
FEV1-Pre: 1.49 L
FEV1FVC-%Change-Post: -6 %
FEV1FVC-%Pred-Pre: 75 %
FEV6-%Change-Post: 3 %
FEV6-%Pred-Post: 86 %
FEV6-%Pred-Pre: 83 %
FEV6-Post: 2.74 L
FEV6-Pre: 2.66 L
FEV6FVC-%Change-Post: 0 %
FEV6FVC-%Pred-Post: 103 %
FEV6FVC-%Pred-Pre: 104 %
FVC-%Change-Post: 3 %
FVC-%Pred-Post: 83 %
FVC-%Pred-Pre: 80 %
FVC-Post: 2.86 L
FVC-Pre: 2.75 L
Post FEV1/FVC ratio: 50 %
Post FEV6/FVC ratio: 96 %
Pre FEV1/FVC ratio: 54 %
Pre FEV6/FVC Ratio: 96 %
RV % pred: 110 %
RV: 2.68 L
TLC % pred: 85 %
TLC: 5.37 L

## 2020-06-21 MED ORDER — TRELEGY ELLIPTA 100-62.5-25 MCG/INH IN AEPB
1.0000 | INHALATION_SPRAY | Freq: Every day | RESPIRATORY_TRACT | 0 refills | Status: DC
Start: 2020-06-21 — End: 2021-01-19

## 2020-06-21 NOTE — Assessment & Plan Note (Signed)
Unfortunately patient received palliative care referral at last office visit.  He felt that this meant that he was "going to die".  We have discussed this at length.  I explained the palliative care is intended for chronic disease management.  He does have severe emphysema.  This is a significant barrier I can complicate the management of his health moving forward especially given the fact that he is dependent on oxygen.  He would like to remain physically active.  I agree with that.  He is willing to establish with pulmonary rehab to increase his overall physical mobility.  We we will refer him today.  If he becomes interested in reestablishing with palliative care he can let us know we can place referral.

## 2020-06-21 NOTE — Progress Notes (Signed)
PFT done today. 

## 2020-06-21 NOTE — Assessment & Plan Note (Signed)
Reviewed with patient that anemia can contribute to shortness of breath.  I do believe the patient's shortness of breath is directly related to his severe emphysema and severe diffusion defect.  I have encouraged him to follow-up with primary care to see if they would like to have any further evaluation of his anemia.

## 2020-06-21 NOTE — Assessment & Plan Note (Signed)
Reviewed pulmonary function testing today with patient as well as family member Shows COPD Gold stage II, severe diffusion defect  Plan: Continue Trelegy Ellipta, samples provided today Will encourage patient to apply for [place] for you for patient assistance given the fact that he is now in the donut hole/coverage Could consider [facility] if patient continues to struggle with medication access Would recommend referral to pulmonary rehab to increase physical mobility We will have patient follow-up with our office in 3 months

## 2020-06-21 NOTE — Progress Notes (Signed)
@Patient  ID: Robert Dixon, male    DOB: 11/10/1941, 79 y.o.   MRN: [id number]  Chief Complaint  Patient presents with  . Follow-up    No complaints    Referring provider: [name], MD  HPI:  79 year old male former smoker followed in our office for non-small cell carcinoma of left lung, severe emphysema, chronic hypoxemic respiratory failure  PMH: None hypertension, acute kidney injury, hypokalemia, DNR status, status post TAVR, pericardial effusion, severe aortic stenosis, CAD Smoker/ Smoking History: Former smoker.  Quit 2000 Maintenance: Trelegy Ellipta 100 Pt of: [name]  06/21/2020  - Visit   79 year old male former smoker initially referred to our office.  Followed in our office by [name].  He was last seen in March/2021.  Plan of care at that office visit was as follows: Continue immunotherapy for stage III non-small cell lung cancer.  Continue follow-up with our office for significant centrilobular emphysema as well as chronic hypoxemic respiratory failure.  Continue follow-up with oncology.  Switch back to Trelegy Ellipta referral to palliative care.  Patient presenting to our office after completing pulmonary function testing.  Those results are listed below:   06/21/2020-pulmonary function test-FVC 2.75 (80% predicted), postbronchodilator ratio 50, postbronchodilator FEV1 1.44 (59% predicted), no bronchodilator response, DLCO 4.58 (21% predicted)  Patient and family member presenting to our office today.  They have no current acute respiratory complaints.  They are concerned regarding the patient's lack of mobility.  Per patient's family member patient has not been as active since being referred to palliative care at last office visit.  He is unsure what can be done to help with management of some of his symptoms.  Patient remains adherent to Trelegy Ellipta 100.  Unfortunately he was just notified that he is now in the donut hole.  We will discuss this today.   Patient has finished immunotherapy with oncology.  And also is status post a TAVR procedure with cardiology.  Patient has no acute worsened respiratory complaints today.  He would like to go over his pulmonary function test.  He reports that he was told by palliative care there is nothing more for them to do and that he can contact them if he would like to be reevaluated.  We will discuss this today.  Patient has not been as active as of late.  He admits that one of his goals is that he would like to build to walk to his mailbox without becoming progressively short of breath.  We will review and discuss this today.  I walk today in office he was able to complete 1 lap without any oxygen desaturations but had to stop due to fatigue.  Patient also has been battling anemia.  He is concerned that this may be affecting his breathing.  He has not followed up with primary care regarding this.  We will discuss this today.  Patient is also struggling with his appetite.  He reports that he has to "force feed himself".  He has not been as hungry as of late.  He is hoping to establish with a gastroenterologist.  He would like a referral today.  We will discuss this as well.  Questionaires / Pulmonary Flowsheets:   ACT:  No flowsheet data found.  MMRC: mMRC Dyspnea Scale mMRC Score  06/21/2020 3    Epworth:  No flowsheet data found.  Tests:   FENO:  No results found for: NITRICOXIDE  PFT: PFT Results Latest Ref Rng & Units 06/21/2020  FVC-Pre L 2.75  FVC-Predicted Pre % 80  FVC-Post L 2.86  FVC-Predicted Post % 83  Pre FEV1/FVC % % 54  Post FEV1/FCV % % 50  FEV1-Pre L 1.49  FEV1-Predicted Pre % 61  FEV1-Post L 1.44  DLCO UNC% % 21  DLCO COR %Predicted % 29  TLC L 5.37  TLC % Predicted % 85  RV % Predicted % 110    WALK:  SIX MIN WALK 06/21/2020 04/15/2019  Supplimental Oxygen during Test? (L/min) Yes No  O2 Flow Rate 3 -  Type Pulse -  Tech Comments: moderate paced walk for 1 lap  and maintained O2 sat above 90% on current settings pt walked average pace one lap had to stop due to increase SOB and O2 sats dropped to 84%.kmw    Imaging: DG Chest 2 View  Result Date: 05/24/2020 CLINICAL DATA:  Dyspnea, pleural effusion, previous stage III B non-small cell lung cancer EXAM: CHEST - 2 VIEW COMPARISON:  04/21/2020, 03/22/2020 FINDINGS: Frontal and lateral views of the chest demonstrates stable right chest wall port and aortic valve prosthesis. Cardiac silhouette is stable. No change in the left basilar consolidation and left pleural effusion. Stable emphysema. No pneumothorax. No acute bony abnormalities. Chronic midthoracic compression deformity unchanged. IMPRESSION: 1. Stable left basilar consolidation and effusion. 2. Stable emphysema. Electronically Signed   By: Randa Ngo M.D.   On: 05/24/2020 22:19    Lab Results:  CBC    Component Value Date/Time   WBC 5.6 04/08/2020 1120   WBC 8.1 03/28/2020 0840   RBC 2.42 (L) 04/08/2020 1120   HGB 9.4 (L) 04/08/2020 1120   HCT 28.7 (L) 04/08/2020 1120   PLT 297 04/08/2020 1120   MCV 118.6 (H) 04/08/2020 1120   MCH 38.8 (H) 04/08/2020 1120   MCHC 32.8 04/08/2020 1120   RDW 15.3 04/08/2020 1120   LYMPHSABS 0.5 (L) 04/08/2020 1120   MONOABS 0.5 04/08/2020 1120   EOSABS 0.2 04/08/2020 1120   BASOSABS 0.1 04/08/2020 1120    BMET    Component Value Date/Time   NA 140 04/08/2020 1120   K 4.4 04/08/2020 1120   CL 107 04/08/2020 1120   CO2 24 04/08/2020 1120   GLUCOSE 105 (H) 04/08/2020 1120   BUN 24 (H) 04/08/2020 1120   CREATININE 1.17 04/08/2020 1120   CALCIUM 8.5 (L) 04/08/2020 1120   GFRNONAA 59 (L) 04/08/2020 1120   GFRAA >60 04/08/2020 1120    BNP    Component Value Date/Time   BNP 910.9 (H) 03/16/2020 0236    ProBNP No results found for: PROBNP  Specialty Problems      Pulmonary Problems   CAP (community acquired pneumonia)   Acute respiratory failure ([place])   Hilar mass   Primary malignant  neoplasm of left upper lobe of lung (HCC)   Non-small cell carcinoma of left lung, stage 3 (HCC)   Centrilobular emphysema (HCC)   Chronic respiratory failure with hypoxia (HCC)   Pleural effusion   COPD exacerbation (HCC)   Acute on chronic respiratory failure with hypoxia (HCC)      No Known Allergies  Immunization History  Administered Date(s) Administered  . Fluad Quad(high Dose 65+) 08/12/2019  . Influenza, High Dose Seasonal PF 09/04/2018  . PFIZER SARS-COV-2 Vaccination 01/18/2020, 02/11/2020  . Pneumococcal Polysaccharide-23 01/30/2011, 05/03/2020  . Tdap 02/21/2010    Past Medical History:  Diagnosis Date  . Acute on chronic respiratory failure with hypoxia ([place]) 03/16/2020  . Acute respiratory failure ([place]) 04/09/2013  .  Carotid artery disease ([place]) 03/28/2020   Carotid US 02/2020:  R 100%; L 1-39%  . Chronic systolic CHF 71/24/5809   Echo 02/2020: EF 30-35, small pericardial effusion, trivial MR, status post TAVR with no PVL, mean gradient 7 mmHg  . Complication of anesthesia   . COPD (chronic obstructive pulmonary disease) ([place])   . GERD (gastroesophageal reflux disease)   . Hemoptysis 11/20/2018  . History of hiatal hernia   . Hypertension   . 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 Mediastinal adenopathy 12/06/2018  . Migraine with visual aura   . Non-small cell carcinoma of left lung, stage 3 ([place]) 12/19/2018  . Non-small cell lung cancer ([place]) dx'd 11/20/18  . Pericardial effusion 10/14/2019  . Pneumonia 11/20/2018  . PONV (postoperative nausea and vomiting)   . Primary malignant neoplasm of left upper lobe of lung ([place]) 12/12/2018  . Pulmonary nodule 11/20/2018  . 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 Severe aortic stenosis 10/14/2019  . Skin cancer     Tobacco History: Social History   Tobacco Use  Smoking Status Former Smoker  . Years: 30.00  . Types: Cigarettes  . Quit date: 2000  . Years since quitting: 21.5  Smokeless  Tobacco Never Used   Counseling given: Not Answered   Continue to not smoke  Outpatient Encounter Medications as of 06/21/2020  Medication Sig  . acetaminophen (TYLENOL) 325 MG tablet Take 2 tablets (650 mg total) by mouth every 6 (six) hours as needed for mild pain (or Fever >/= 101).  Marland Kitchen albuterol (PROVENTIL) (2.5 MG/3ML) 0.083% nebulizer solution Take 3 mLs (2.5 mg total) by nebulization every 4 (four) hours.  Marland Kitchen albuterol (VENTOLIN HFA) 108 (90 Base) MCG/ACT inhaler Inhale 2 puffs into the lungs every 6 (six) hours as needed for wheezing or shortness of breath.  Marland Kitchen alum & mag hydroxide-simeth (MAALOX/MYLANTA) 200-200-20 MG/5ML suspension Take 15 mLs by mouth every 6 (six) hours as needed for indigestion or heartburn.  Marland Kitchen ascorbic acid (VITAMIN C) 500 MG tablet Take 500 mg by mouth daily.  Marland Kitchen aspirin 81 MG chewable tablet Chew 1 tablet (81 mg total) by mouth daily.  Marland Kitchen atorvastatin (LIPITOR) 40 MG tablet Take 1 tablet (40 mg total) by mouth daily.  . citalopram (CELEXA) 10 MG tablet Take 10 mg by mouth daily.  . clopidogrel (PLAVIX) 75 MG tablet Take 1 tablet (75 mg total) by mouth daily with breakfast.  . Cyanocobalamin (VITAMIN B 12 PO) Take 1 capsule by mouth daily. 2500 mcg  . furosemide (LASIX) 20 MG tablet Take 1 tablet (20 mg total) by mouth daily.  . metoprolol succinate (TOPROL-XL) 25 MG 24 hr tablet Take 0.5 tablets (12.5 mg total) by mouth daily.  . Multiple Vitamin (MULTIVITAMIN) tablet Take 1 tablet by mouth daily.  . pantoprazole (PROTONIX) 40 MG tablet Take 40 mg by mouth 2 (two) times daily.  . polycarbophil (FIBERCON) 625 MG tablet Take 625 mg by mouth 2 (two) times daily.   . tamsulosin (FLOMAX) 0.4 MG CAPS capsule Take 0.4 mg by mouth daily.   . TRELEGY ELLIPTA 100-62.5-25 MCG/INH AEPB Inhale 1 puff into the lungs daily.  . [DISCONTINUED] amoxicillin (AMOXIL) 500 MG tablet Take 4 capsules (2,000 mg) one hour prior to all dental visits.  . [DISCONTINUED] dimenhyDRINATE  (DRAMAMINE) 50 MG tablet Take 50 mg by mouth every 8 (eight) hours as needed for dizziness.   . [DISCONTINUED] docusate sodium (COLACE) 100 MG capsule Take 100 mg by mouth daily as needed  for mild constipation or moderate constipation.  . [DISCONTINUED] guaiFENesin-dextromethorphan (ROBITUSSIN DM) 100-10 MG/5ML syrup Take 5 mLs by mouth every 4 (four) hours as needed for cough.  . Fluticasone-Umeclidin-Vilant (TRELEGY ELLIPTA) 100-62.5-25 MCG/INH AEPB Inhale 1 puff into the lungs daily.  . [DISCONTINUED] prochlorperazine (COMPAZINE) 10 MG tablet TAKE ONE TABLET BY MOUTH EVERY 6 HOURS AS NEEDED FOR NAUSEA OR VOMITING (Patient not taking: Reported on 06/21/2020)  . [DISCONTINUED] traMADol (ULTRAM) 50 MG tablet Take 50 mg by mouth 2 (two) times daily as needed. (Patient not taking: Reported on 06/21/2020)   Facility-Administered Encounter Medications as of 06/21/2020  Medication  . sodium chloride flush (NS) 0.9 % injection 10 mL  . sodium chloride flush (NS) 0.9 % injection 10 mL     Review of Systems  Review of Systems  Constitutional: Positive for appetite change and fatigue. Negative for activity change, chills, fever and unexpected weight change.  HENT: Positive for congestion. Negative for postnasal drip, rhinorrhea, sinus pressure, sinus pain and sore throat.   Eyes: Negative.   Respiratory: Positive for shortness of breath. Negative for cough and wheezing.   Cardiovascular: Negative for chest pain, palpitations and leg swelling.  Gastrointestinal: Negative for constipation, diarrhea, nausea and vomiting.  Endocrine: Negative.   Genitourinary: Negative.   Musculoskeletal: Negative.   Skin: Negative.   Neurological: Negative for dizziness and headaches.  Psychiatric/Behavioral: Negative.  Negative for dysphoric mood. The patient is not nervous/anxious.   All other systems reviewed and are negative.    Physical Exam  BP (!) 110/64 (BP Location: Left Arm, Cuff Size: Normal)   Pulse  94   Temp 98 F (36.7 C) (Oral)   Ht 5\' 6"  (1.676 m)   Wt 156 lb (70.8 kg)   SpO2 98% Comment: 3L pulse O2  BMI 25.18 kg/m   Wt Readings from Last 5 Encounters:  06/21/20 156 lb (70.8 kg)  06/04/20 156 lb 6.4 oz (70.9 kg)  04/21/20 158 lb (71.7 kg)  04/08/20 156 lb 12.8 oz (71.1 kg)  04/01/20 159 lb 9.6 oz (72.4 kg)    BMI Readings from Last 5 Encounters:  06/21/20 25.18 kg/m  06/04/20 23.78 kg/m  04/21/20 24.02 kg/m  04/08/20 23.84 kg/m  04/01/20 24.27 kg/m     Physical Exam Vitals and nursing note reviewed.  Constitutional:      General: He is not in acute distress.    Appearance: Normal appearance. He is normal weight.  HENT:     Head: Normocephalic and atraumatic.     Right Ear: Hearing and external ear normal.     Left Ear: Hearing and external ear normal.     Nose: No mucosal edema.     Right Turbinates: Not enlarged.     Left Turbinates: Not enlarged.  Eyes:     Pupils: Pupils are equal, round, and reactive to light.  Cardiovascular:     Rate and Rhythm: Normal rate and regular rhythm.     Pulses: Normal pulses.     Heart sounds: Normal heart sounds. No murmur heard.   Pulmonary:     Effort: Pulmonary effort is normal.     Breath sounds: No decreased breath sounds, wheezing or rales.     Comments: Diminished breath sounds throughout exam Musculoskeletal:     Cervical back: Normal range of motion.     Right lower leg: No edema.     Left lower leg: No edema.  Lymphadenopathy:     Cervical: No cervical adenopathy.  Skin:  General: Skin is warm and dry.     Capillary Refill: Capillary refill takes less than 2 seconds.     Findings: No erythema or rash.  Neurological:     General: No focal deficit present.     Mental Status: He is alert and oriented to person, place, and time.     Motor: No weakness.     Coordination: Coordination normal.     Gait: Gait is intact. Gait normal.  Psychiatric:        Mood and Affect: Mood normal.         Behavior: Behavior normal. Behavior is cooperative.        Thought Content: Thought content normal.        Judgment: Judgment normal.       Assessment & Plan:   Centrilobular emphysema ([place]) Reviewed pulmonary function testing today with patient as well as family member Shows COPD Gold stage II, severe diffusion defect  Plan: Continue Trelegy Ellipta, samples provided today Will encourage patient to apply for [place] for you for patient assistance given the fact that he is now in the donut hole/coverage Could [facility] if patient continues to struggle with medication access Would recommend referral to pulmonary rehab to increase physical mobility We will have patient follow-up with our office in 3 months   Chronic respiratory failure with hypoxia ([place]) Plan: Continue oxygen therapy Walk today in office, oxygen stable without any oxygen desaturations on 3 L pulsed  Non-small cell carcinoma of left lung, stage 3 ([place]) Plan: Keep follow-up with oncology  Antineoplastic chemotherapy induced anemia Reviewed with patient that anemia can contribute to shortness of breath.  I do believe the patient's shortness of breath is directly related to his severe emphysema and severe diffusion defect.  I have encouraged him to follow-up with primary care to see if they would like to have any further evaluation of his anemia.  Goals of care, counseling/discussion Unfortunately patient received palliative care referral at last office visit.  He felt that this meant that he was "going to die".  We have discussed this at length.  I explained the palliative care is intended for chronic disease management.  He does have severe emphysema.  This is a significant barrier I can complicate the management of his health moving forward especially given the fact that he is dependent on oxygen.  He would like to remain physically active.  I agree with that.  He is willing to  establish with pulmonary rehab to increase his overall physical mobility.  We we will refer him today.  If he becomes interested in reestablishing with palliative care he can let us know we can place referral.  Loss of appetite Patient and family requesting gastroenterology referral  Plan: We will refer to [facility] gastroenterology Encourage patient to continue to have high-protein boost or Ensure in between meals  Medication management Patient currently in coverage For Trelegy Ellipta  Plan: Samples provided today [place] for you paperwork provided for patient's family member If patient continues to struggle with accessing medications okay to [facility] for COPD management and medication assistance    Return in about 3 months (around 09/21/2020), or if symptoms worsen or fail to improve, for Follow up with [name], Follow up with Wyn Quaker FNP-C.   Lauraine Rinne, NP 06/21/2020   This appointment required 44 minutes of patient care (this includes precharting, chart review, review of results, face-to-face care, etc.).

## 2020-06-21 NOTE — Assessment & Plan Note (Signed)
Plan: Continue oxygen therapy Walk today in office, oxygen stable without any oxygen desaturations on 3 L pulsed

## 2020-06-21 NOTE — Assessment & Plan Note (Signed)
Patient currently in coverage For Trelegy Ellipta  Plan: Samples provided today [employer] for you paperwork provided for patient's family member If patient continues to struggle with accessing medications okay to [facility] for COPD management and medication assistance

## 2020-06-21 NOTE — Assessment & Plan Note (Signed)
Plan:  Keep follow up with oncology 

## 2020-06-21 NOTE — Assessment & Plan Note (Signed)
Patient and family requesting gastroenterology referral  Plan: We will refer to [facility] gastroenterology Encourage patient to continue to have high-protein boost or Ensure in between meals

## 2020-06-21 NOTE — Patient Instructions (Addendum)
You were seen today by Lauraine Rinne, NP  for:   1. Centrilobular emphysema (HCC)  Trelegy Ellipta 100 >>> 1 puff daily in the morning >>>rinse mouth out after use  >>> This inhaler contains 3 medications that help manage her respiratory status, contact our office if you cannot afford this medication or unable to remain on this medication  Only use your albuterol as a rescue medication to be used if you can't catch your breath by resting or doing a relaxed purse lip breathing pattern.  - The less you use it, the better it will work when you need it. - Ok to use up to 2 puffs  every 4 hours if you must but call for immediate appointment if use goes up over your usual need - Don't leave home without it !!  (think of it like the spare tire for your car)   Note your daily symptoms > remember "red flags" for COPD:   >>>Increase in cough >>>increase in sputum production >>>increase in shortness of breath or activity  intolerance.   If you notice these symptoms, please call the office to be seen.    We will refer you today to pulmonary rehab   2. Chronic respiratory failure with hypoxia (HCC)  Walk today in office   Continue oxygen therapy as prescribed  >>>maintain oxygen saturations greater than 88 percent  >>>if unable to maintain oxygen saturations please contact the office  >>>do not smoke with oxygen  >>>can use nasal saline gel or nasal saline rinses to moisturize nose if oxygen causes dryness   3. Non-small cell carcinoma of left lung, stage 3 ([place])  Continue follow up with Oncology   4. Goals of care, counseling/discussion  Continue to monitor symptoms   5. Medication management  We will provide you Trelegy Ellipta samples today  Please also fill out the paperwork provided today for [place] for you, this is a patient assistance program through the pharmaceutical company.  After you have completed the paperwork as well as provided documentation that you have spent over $600  out-of-pocket at your pharmacy please return this to our office and we can work on getting this submitted   6.  Anemia  Please follow-up with primary care and discuss this with him  7.  Loss of appetite  We will refer you to gastroenterology today as discussed.   We recommend today:  Orders Placed This Encounter  Procedures  . AMB referral to pulmonary rehabilitation    Referral Priority:   Routine    Referral Type:   Consultation    Number of Visits Requested:   1  . Ambulatory referral to Gastroenterology    Referral Priority:   Routine    Referral Type:   Consultation    Referral Reason:   Specialty Services Required    Number of Visits Requested:   1   Orders Placed This Encounter  Procedures  . AMB referral to pulmonary rehabilitation  . Ambulatory referral to Gastroenterology   No orders of the defined types were placed in this encounter.   Follow Up:    Return in about 3 months (around 09/21/2020), or if symptoms worsen or fail to improve, for Follow up with [name], Follow up with Wyn Quaker FNP-C.   Please do your part to reduce the spread of COVID-19:      Reduce your risk of any infection  and COVID19 by using the similar precautions used for avoiding the common cold or flu:  .  Wash your hands often with soap and warm water for at least 20 seconds.  If soap and water are not readily available, use an alcohol-based hand sanitizer with at least 60% alcohol.  . If coughing or sneezing, cover your mouth and nose by coughing or sneezing into the elbow areas of your shirt or coat, into a tissue or into your sleeve (not your hands). Langley Gauss A MASK when in public  . Avoid shaking hands with others and consider head nods or verbal greetings only. . Avoid touching your eyes, nose, or mouth with unwashed hands.  . Avoid close contact with people who are sick. . Avoid places or events with large numbers of people in one location, like concerts or sporting events. .  If you have some symptoms but not all symptoms, continue to monitor at home and seek medical attention if your symptoms worsen. . If you are having a medical emergency, call 911.   [place] / e-Visit: [email or web address]         [facility]: [facility]: [phone]                   [facility]: [phone]     It is flu season:   >>> Best ways to protect herself from the flu: Receive the yearly flu vaccine, practice good hand hygiene washing with soap and also using hand sanitizer when available, eat a nutritious meals, get adequate rest, hydrate appropriately   Please contact the office if your symptoms worsen or you have concerns that you are not improving.   Thank you for choosing [facility] for your healthcare, and for allowing [place] to partner with you on your healthcare journey. I am thankful to be able to provide care to you today.   Wyn Quaker FNP-C

## 2020-06-24 ENCOUNTER — Telehealth (HOSPITAL_COMMUNITY): Payer: Self-pay

## 2020-06-24 NOTE — Telephone Encounter (Signed)
Pt insurance is active and benefits verified through [place] $10, DED 0/0 met, out of pocket $3,900/$3,900 met, co-insurance 0%. no pre-authorization required. Passport, [place]/[employer] 06/24/2020'@1' :30pm, REF# [id number]

## 2020-06-25 ENCOUNTER — Encounter (HOSPITAL_COMMUNITY): Payer: Self-pay | Admitting: *Deleted

## 2020-06-25 NOTE — Progress Notes (Signed)
Received referral from Dr. Elpidio Anis for this pt to participate in pulmonary rehab with the the diagnosis of Centriobular Emphsema. Clinical review of pt follow up appt on 7/26 Pulmonary office note. Also called to assess pt ability to participate in a group setting.  Pt verbalized he will be dropped off initially and felt he could ambulate the distance needed to enter the department unassisted.  Pt with Covid Risk Score - 7. Pt appropriate for scheduling for Pulmonary rehab.  Will forward to pulmonary rehab staff for scheduling. Cherre Huger, BSN Cardiac and Training and development officer

## 2020-06-28 ENCOUNTER — Inpatient Hospital Stay: Payer: Medicare HMO | Attending: Internal Medicine

## 2020-06-28 ENCOUNTER — Inpatient Hospital Stay: Payer: Medicare HMO

## 2020-06-28 ENCOUNTER — Encounter (HOSPITAL_COMMUNITY): Payer: Self-pay

## 2020-06-28 ENCOUNTER — Ambulatory Visit (HOSPITAL_COMMUNITY)
Admission: RE | Admit: 2020-06-28 | Discharge: 2020-06-28 | Disposition: A | Discharge status: Home / Self Care | Payer: Medicare HMO | Source: Ambulatory Visit | Attending: Physician Assistant | Admitting: Physician Assistant

## 2020-06-28 ENCOUNTER — Other Ambulatory Visit: Payer: Self-pay

## 2020-06-28 DIAGNOSIS — C3492 Malignant neoplasm of unspecified part of left bronchus or lung: Secondary | ICD-10-CM

## 2020-06-28 DIAGNOSIS — C3432 Malignant neoplasm of lower lobe, left bronchus or lung: Secondary | ICD-10-CM | POA: Insufficient documentation

## 2020-06-28 DIAGNOSIS — Z85828 Personal history of other malignant neoplasm of skin: Secondary | ICD-10-CM | POA: Insufficient documentation

## 2020-06-28 DIAGNOSIS — I110 Hypertensive heart disease with heart failure: Secondary | ICD-10-CM | POA: Diagnosis not present

## 2020-06-28 DIAGNOSIS — Z452 Encounter for adjustment and management of vascular access device: Secondary | ICD-10-CM | POA: Insufficient documentation

## 2020-06-28 DIAGNOSIS — J449 Chronic obstructive pulmonary disease, unspecified: Secondary | ICD-10-CM | POA: Insufficient documentation

## 2020-06-28 DIAGNOSIS — K219 Gastro-esophageal reflux disease without esophagitis: Secondary | ICD-10-CM | POA: Insufficient documentation

## 2020-06-28 DIAGNOSIS — Z9221 Personal history of antineoplastic chemotherapy: Secondary | ICD-10-CM | POA: Diagnosis not present

## 2020-06-28 DIAGNOSIS — Z7982 Long term (current) use of aspirin: Secondary | ICD-10-CM | POA: Insufficient documentation

## 2020-06-28 DIAGNOSIS — Z79899 Other long term (current) drug therapy: Secondary | ICD-10-CM | POA: Insufficient documentation

## 2020-06-28 DIAGNOSIS — I5022 Chronic systolic (congestive) heart failure: Secondary | ICD-10-CM | POA: Diagnosis not present

## 2020-06-28 DIAGNOSIS — I313 Pericardial effusion (noninflammatory): Secondary | ICD-10-CM | POA: Diagnosis not present

## 2020-06-28 DIAGNOSIS — Z952 Presence of prosthetic heart valve: Secondary | ICD-10-CM | POA: Insufficient documentation

## 2020-06-28 DIAGNOSIS — Z923 Personal history of irradiation: Secondary | ICD-10-CM | POA: Diagnosis not present

## 2020-06-28 DIAGNOSIS — I700 Atherosclerosis of aorta: Secondary | ICD-10-CM | POA: Diagnosis not present

## 2020-06-28 DIAGNOSIS — J439 Emphysema, unspecified: Secondary | ICD-10-CM | POA: Diagnosis not present

## 2020-06-28 DIAGNOSIS — Z5111 Encounter for antineoplastic chemotherapy: Secondary | ICD-10-CM | POA: Diagnosis not present

## 2020-06-28 DIAGNOSIS — Z95828 Presence of other vascular implants and grafts: Secondary | ICD-10-CM

## 2020-06-28 LAB — CMP (CANCER CENTER ONLY)
ALT: 25 U/L (ref 0–44)
AST: 22 U/L (ref 15–41)
Albumin: 3.5 g/dL (ref 3.5–5.0)
Alkaline Phosphatase: 106 U/L (ref 38–126)
Anion gap: 7 (ref 5–15)
BUN: 15 mg/dL (ref 8–23)
CO2: 24 mmol/L (ref 22–32)
Calcium: 9.3 mg/dL (ref 8.9–10.3)
Chloride: 110 mmol/L (ref 98–111)
Creatinine: 1.03 mg/dL (ref 0.61–1.24)
GFR, Est AFR Am: >60 mL/min (ref >60)
GFR, Estimated: >60 mL/min (ref >60)
Glucose, Bld: 84 mg/dL (ref 70–99)
Potassium: 4.2 mmol/L (ref 3.5–5.1)
Sodium: 141 mmol/L (ref 135–145)
Total Bilirubin: 0.7 mg/dL (ref 0.3–1.2)
Total Protein: 6.2 g/dL — ABNORMAL LOW (ref 6.5–8.1)

## 2020-06-28 LAB — CBC WITH DIFFERENTIAL (CANCER CENTER ONLY)
Abs Immature Granulocytes: 0.02 10*3/uL (ref 0.00–0.07)
Basophils Absolute: 0.1 10*3/uL (ref 0.0–0.1)
Basophils Relative: 3 %
Eosinophils Absolute: 0.1 10*3/uL (ref 0.0–0.5)
Eosinophils Relative: 2 %
HCT: 30.7 % — ABNORMAL LOW (ref 39.0–52.0)
Hemoglobin: 10 g/dL — ABNORMAL LOW (ref 13.0–17.0)
Immature Granulocytes: 0 %
Lymphocytes Relative: 12 %
Lymphs Abs: 0.6 10*3/uL — ABNORMAL LOW (ref 0.7–4.0)
MCH: 36.9 pg — ABNORMAL HIGH (ref 26.0–34.0)
MCHC: 32.6 g/dL (ref 30.0–36.0)
MCV: 113.3 fL — ABNORMAL HIGH (ref 80.0–100.0)
Monocytes Absolute: 0.6 10*3/uL (ref 0.1–1.0)
Monocytes Relative: 10 %
Neutro Abs: 4 10*3/uL (ref 1.7–7.7)
Neutrophils Relative %: 73 %
Platelet Count: 196 10*3/uL (ref 150–400)
RBC: 2.71 MIL/uL — ABNORMAL LOW (ref 4.22–5.81)
RDW: 14.8 % (ref 11.5–15.5)
WBC Count: 5.5 10*3/uL (ref 4.0–10.5)
nRBC: 0 % (ref 0.0–0.2)

## 2020-06-28 MED ORDER — HEPARIN SOD (PORK) LOCK FLUSH 100 UNIT/ML IV SOLN
INTRAVENOUS | Status: AC
  Filled 2020-06-28: qty 5

## 2020-06-28 MED ORDER — IOHEXOL 300 MG/ML  SOLN
75.0000 mL | Freq: Once | INTRAMUSCULAR | Status: AC | PRN
  Administered 2020-06-28: 75 mL via INTRAVENOUS

## 2020-06-28 MED ORDER — HEPARIN SOD (PORK) LOCK FLUSH 100 UNIT/ML IV SOLN
500.0000 [IU] | Freq: Once | INTRAVENOUS | Status: AC | PRN
  Administered 2020-06-28: 500 [IU]
  Filled 2020-06-28: qty 5

## 2020-06-28 MED ORDER — HEPARIN SOD (PORK) LOCK FLUSH 100 UNIT/ML IV SOLN: 500.0000 [IU] | Freq: Once | INTRAVENOUS | Status: DC

## 2020-06-28 MED ORDER — SODIUM CHLORIDE 0.9% FLUSH
10.0000 mL | INTRAVENOUS | Status: DC | PRN
  Administered 2020-06-28: 10 mL
  Filled 2020-06-28: qty 10

## 2020-06-28 MED ORDER — SODIUM CHLORIDE (PF) 0.9 % IJ SOLN
INTRAMUSCULAR | Status: AC
  Filled 2020-06-28: qty 50

## 2020-07-01 ENCOUNTER — Other Ambulatory Visit: Payer: Self-pay

## 2020-07-01 ENCOUNTER — Other Ambulatory Visit: Payer: Medicare HMO

## 2020-07-01 ENCOUNTER — Encounter: Payer: Self-pay | Admitting: Internal Medicine

## 2020-07-01 ENCOUNTER — Inpatient Hospital Stay (HOSPITAL_BASED_OUTPATIENT_CLINIC_OR_DEPARTMENT_OTHER): Payer: Medicare HMO | Admitting: Internal Medicine

## 2020-07-01 VITALS — BP 109/60 | HR 86 | Temp 97.7°F | Resp 16 | Ht 66.0 in | Wt 157.5 lb

## 2020-07-01 DIAGNOSIS — J439 Emphysema, unspecified: Secondary | ICD-10-CM | POA: Diagnosis not present

## 2020-07-01 DIAGNOSIS — J9611 Chronic respiratory failure with hypoxia: Secondary | ICD-10-CM | POA: Diagnosis not present

## 2020-07-01 DIAGNOSIS — C3432 Malignant neoplasm of lower lobe, left bronchus or lung: Secondary | ICD-10-CM | POA: Diagnosis not present

## 2020-07-01 DIAGNOSIS — C3490 Malignant neoplasm of unspecified part of unspecified bronchus or lung: Secondary | ICD-10-CM | POA: Diagnosis not present

## 2020-07-01 DIAGNOSIS — K219 Gastro-esophageal reflux disease without esophagitis: Secondary | ICD-10-CM | POA: Diagnosis not present

## 2020-07-01 DIAGNOSIS — J9600 Acute respiratory failure, unspecified whether with hypoxia or hypercapnia: Secondary | ICD-10-CM | POA: Diagnosis not present

## 2020-07-01 DIAGNOSIS — Z9221 Personal history of antineoplastic chemotherapy: Secondary | ICD-10-CM | POA: Diagnosis not present

## 2020-07-01 DIAGNOSIS — J449 Chronic obstructive pulmonary disease, unspecified: Secondary | ICD-10-CM | POA: Diagnosis not present

## 2020-07-01 DIAGNOSIS — I110 Hypertensive heart disease with heart failure: Secondary | ICD-10-CM | POA: Diagnosis not present

## 2020-07-01 DIAGNOSIS — J90 Pleural effusion, not elsewhere classified: Secondary | ICD-10-CM | POA: Diagnosis not present

## 2020-07-01 DIAGNOSIS — C3492 Malignant neoplasm of unspecified part of left bronchus or lung: Secondary | ICD-10-CM | POA: Diagnosis not present

## 2020-07-01 DIAGNOSIS — I5022 Chronic systolic (congestive) heart failure: Secondary | ICD-10-CM | POA: Diagnosis not present

## 2020-07-01 DIAGNOSIS — J432 Centrilobular emphysema: Secondary | ICD-10-CM | POA: Diagnosis not present

## 2020-07-01 DIAGNOSIS — Z923 Personal history of irradiation: Secondary | ICD-10-CM | POA: Diagnosis not present

## 2020-07-01 DIAGNOSIS — Z452 Encounter for adjustment and management of vascular access device: Secondary | ICD-10-CM | POA: Diagnosis not present

## 2020-07-01 DIAGNOSIS — C3412 Malignant neoplasm of upper lobe, left bronchus or lung: Secondary | ICD-10-CM | POA: Diagnosis not present

## 2020-07-01 DIAGNOSIS — Z952 Presence of prosthetic heart valve: Secondary | ICD-10-CM | POA: Diagnosis not present

## 2020-07-01 NOTE — Progress Notes (Signed)
[name] [phone]   [phone]  OFFICE PROGRESS NOTE  Robert Arabian, MD [address] [place] [address]  DIAGNOSIS: Stage IIIB (T1b, N3, M0) non-small cell lung cancer favoring adenocarcinoma diagnosed in January 2020 and presented with left lower lobe pulmonary nodule in addition to bilateral hilar and subcarinal lymphadenopathy.  Molecular studies by guardant 360 showed no actionable mutations.  PRIOR THERAPY:  1) Concurrent chemoradiation with weekly carboplatin for AUC of 2 and paclitaxel 45 mg/M2. First dose February 4th, 2020. Status post 5 cycles.  2)  Consolidation immunotherapy with Imfinzi 10 mg/KG every 2 weeks, status post 26 cycles.  CURRENT THERAPY:  Observation.  INTERVAL HISTORY: Robert Dixon 79 y.o. male returns to the clinic today for follow-up visit accompanied by his daughter-in-law.  The patient is feeling fine today with no concerning complaints set for the baseline shortness of breath and he is currently on home oxygen.  He denied having any current chest pain, cough or hemoptysis.  He denied having any fever or chills.  He has no nausea, vomiting, diarrhea or constipation.  He has no headache or visual changes.  He is here today for evaluation with repeat CT scan of the chest for restaging of his disease.   MEDICAL HISTORY: Past Medical History:  Diagnosis Date  . Acute on chronic respiratory failure with hypoxia ([facility]) 03/16/2020  . Acute respiratory failure ([place]) 04/09/2013  . Carotid artery disease ([facility]) 03/28/2020   Carotid US 02/2020:  R 100%; L 1-39%  . Chronic systolic CHF 66/59/9357   Echo 02/2020: EF 30-35, small pericardial effusion, trivial MR, status post TAVR with no PVL, mean gradient 7 mmHg  . Complication of anesthesia   . COPD (chronic obstructive pulmonary disease) ([facility])   . GERD (gastroesophageal reflux disease)   . Hemoptysis 11/20/2018  . History of hiatal hernia   .  Hypertension   . Legionnaire's disease ([facility] [place]) 2014  . Mediastinal adenopathy 12/06/2018  . Migraine with visual aura   . Non-small cell carcinoma of left lung, stage 3 ([facility]) 12/19/2018  . Non-small cell lung cancer ([facility]) dx'd 11/20/18  . Pericardial effusion 10/14/2019  . Pneumonia 11/20/2018  . PONV (postoperative nausea and vomiting)   . Primary malignant neoplasm of left upper lobe of lung ([facility]) 12/12/2018  . Pulmonary nodule 11/20/2018  . 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 Severe aortic stenosis 10/14/2019  . Skin cancer     ALLERGIES:  has No Known Allergies.  MEDICATIONS:  Current Outpatient Medications  Medication Sig Dispense Refill  . acetaminophen (TYLENOL) 325 MG tablet Take 2 tablets (650 mg total) by mouth every 6 (six) hours as needed for mild pain (or Fever >/= 101).    Marland Kitchen albuterol (PROVENTIL) (2.5 MG/3ML) 0.083% nebulizer solution Take 3 mLs (2.5 mg total) by nebulization every 4 (four) hours. 1080 mL 5  . albuterol (VENTOLIN HFA) 108 (90 Base) MCG/ACT inhaler Inhale 2 puffs into the lungs every 6 (six) hours as needed for wheezing or shortness of breath. 18 g 5  . alum & mag hydroxide-simeth (MAALOX/MYLANTA) 200-200-20 MG/5ML suspension Take 15 mLs by mouth every 6 (six) hours as needed for indigestion or heartburn. 355 mL 0  . ascorbic acid (VITAMIN C) 500 MG tablet Take 500 mg by mouth daily.    Marland Kitchen aspirin 81 MG chewable tablet Chew 1 tablet (81 mg total) by mouth daily.    Marland Kitchen atorvastatin (LIPITOR) 40  MG tablet Take 1 tablet (40 mg total) by mouth daily. 90 tablet 3  . citalopram (CELEXA) 10 MG tablet Take 10 mg by mouth daily.    . clopidogrel (PLAVIX) 75 MG tablet Take 1 tablet (75 mg total) by mouth daily with breakfast. 90 tablet 3  . Cyanocobalamin (VITAMIN B 12 PO) Take 1 capsule by mouth daily. 2500 mcg    . Fluticasone-Umeclidin-Vilant (TRELEGY ELLIPTA) 100-62.5-25 MCG/INH AEPB Inhale 1 puff into the lungs daily. 60 each 0  .  furosemide (LASIX) 20 MG tablet Take 1 tablet (20 mg total) by mouth daily. 90 tablet 3  . metoprolol succinate (TOPROL-XL) 25 MG 24 hr tablet Take 0.5 tablets (12.5 mg total) by mouth daily. 45 tablet 3  . Multiple Vitamin (MULTIVITAMIN) tablet Take 1 tablet by mouth daily.    . pantoprazole (PROTONIX) 40 MG tablet Take 40 mg by mouth 2 (two) times daily.    . polycarbophil (FIBERCON) 625 MG tablet Take 625 mg by mouth 2 (two) times daily.     . tamsulosin (FLOMAX) 0.4 MG CAPS capsule Take 0.4 mg by mouth daily.     . TRELEGY ELLIPTA 100-62.5-25 MCG/INH AEPB Inhale 1 puff into the lungs daily. 180 each 1   No current facility-administered medications for this visit.   Facility-Administered Medications Ordered in Other Visits  Medication Dose Route Frequency Provider Last Rate Last Admin  . sodium chloride flush (NS) 0.9 % injection 10 mL  10 mL Intracatheter PRN Robert Bears, MD      . sodium chloride flush (NS) 0.9 % injection 10 mL  10 mL Intracatheter PRN Robert Bears, MD   10 mL at 05/20/19 1232    SURGICAL HISTORY:  Past Surgical History:  Procedure Laterality Date  . COLONOSCOPY W/ POLYPECTOMY    . IR IMAGING GUIDED PORT INSERTION  03/14/2019  . IR THORACENTESIS ASP PLEURAL SPACE W/IMG GUIDE  03/26/2020  . RIGHT HEART CATH AND CORONARY ANGIOGRAPHY N/A 03/19/2020   Procedure: RIGHT HEART CATH AND CORONARY ANGIOGRAPHY;  Surgeon: Robert Mocha, MD;  Location: [facility];  Service: Cardiovascular;  Laterality: N/A;  . TEE WITHOUT CARDIOVERSION N/A 03/25/2020   Procedure: TRANSESOPHAGEAL ECHOCARDIOGRAM (TEE);  Surgeon: Robert Mocha, MD;  Location: [facility];  Service: Open Heart Surgery;  Laterality: N/A;  . TONSILLECTOMY    . TRANSCATHETER AORTIC VALVE REPLACEMENT, TRANSFEMORAL Left 03/25/2020   Procedure: TRANSCATHETER AORTIC VALVE REPLACEMENT, LEFT TRANSFEMORAL;  Surgeon: Robert Mocha, MD;  Location: [facility];  Service: Open Heart Surgery;   Laterality: Left;  Marland Kitchen VASECTOMY    . VIDEO BRONCHOSCOPY WITH ENDOBRONCHIAL ULTRASOUND N/A 12/06/2018   Procedure: VIDEO BRONCHOSCOPY WITH ENDOBRONCHIAL ULTRASOUND;  Surgeon: Garner Nash, DO;  Location: [facility];  Service: Thoracic;  Laterality: N/A;    REVIEW OF SYSTEMS:  A comprehensive review of systems was negative except for: Respiratory: positive for dyspnea on exertion   PHYSICAL EXAMINATION: General appearance: alert, cooperative and no distress Head: Normocephalic, without obvious abnormality, atraumatic Neck: no adenopathy, no JVD, supple, symmetrical, trachea midline and thyroid not enlarged, symmetric, no tenderness/mass/nodules Lymph nodes: Cervical, supraclavicular, and axillary nodes normal. Resp: clear to auscultation bilaterally Back: symmetric, no curvature. ROM normal. No CVA tenderness. Cardio: systolic murmur: systolic ejection 3/6, harsh at 2nd right intercostal space GI: soft, non-tender; bowel sounds normal; no masses,  no organomegaly Extremities: extremities normal, atraumatic, no cyanosis or edema  ECOG PERFORMANCE STATUS: 1 - Symptomatic but completely ambulatory  Blood pressure 109/60, pulse  86, temperature 97.7 F (36.5 C), temperature source Temporal, resp. rate 16, height 5\' 6"  (1.676 m), weight 157 lb 8 oz (71.4 kg), SpO2 97 %.  LABORATORY DATA: Lab Results  Component Value Date   WBC 5.5 06/28/2020   HGB 10.0 (L) 06/28/2020   HCT 30.7 (L) 06/28/2020   MCV 113.3 (H) 06/28/2020   PLT 196 06/28/2020      Chemistry      Component Value Date/Time   NA 141 06/28/2020 0949   K 4.2 06/28/2020 0949   CL 110 06/28/2020 0949   CO2 24 06/28/2020 0949   BUN 15 06/28/2020 0949   CREATININE 1.03 06/28/2020 0949      Component Value Date/Time   CALCIUM 9.3 06/28/2020 0949   ALKPHOS 106 06/28/2020 0949   AST 22 06/28/2020 0949   ALT 25 06/28/2020 0949   BILITOT 0.7 06/28/2020 0949       RADIOGRAPHIC STUDIES: CT Chest W Contrast  Result Date:  06/28/2020 CLINICAL DATA:  Lung cancer follow-up, completed chemotherapy in 2020, also status post radiotherapy. EXAM: CT CHEST WITH CONTRAST TECHNIQUE: Multidetector CT imaging of the chest was performed during intravenous contrast administration. CONTRAST:  65mL OMNIPAQUE IOHEXOL 300 MG/ML  SOLN COMPARISON:  March 22, 2020 in March 15, 2020 is most recent comparison is. More remote priors dating back to 2020 FINDINGS: Cardiovascular: Calcified and noncalcified atheromatous plaque in the thoracic aorta. Post trans aortic valve replacement since previous imaging. Enlargement of pericardial fluid, measuring water density and without significant pericardial enhancement. Seen in the context of enlarging LEFT pleural effusion much of which is sub pulmonic. See below. Central pulmonary vasculature on venous phase assessment is unremarkable. Mediastinum/Nodes: No hilar adenopathy. Soft tissue about the LEFT hilum related to prior radiation change is similar. No mediastinal adenopathy or axillary lymphadenopathy. RIGHT-sided Port-A-Cath terminates at the caval to atrial junction. Mild esophageal thickening may be related to esophagitis in the setting of prior radiation. Lungs/Pleura: Severe pulmonary emphysema worse at the lung apices with similar appearance. Near complete resolution of RIGHT-sided pleural effusion. No new suspicious parenchymal finding in the chest. Similar volume of large LEFT sub pulmonic fluid at the lung base with diminished pleural fluid tracking over the LEFT lung apex since the prior study. Upper Abdomen: Stable low-density hepatic lesions some of which are incompletely imaged. Adrenal glands are normal. No upper abdominal adenopathy or acute abdominal process. Hemorrhagic cyst partially imaged arising from the upper pole of the LEFT kidney has decompressed since prior imaging of 2020 appearing more similar to the study of 12/01/2019 but incompletely imaged on today's exam. No acute upper abdominal  process. Musculoskeletal: Signs of thoracic spine compression fracture a T7 with similar appearance. No acute musculoskeletal process. No destructive bone finding. Spinal degenerative changes and osteopenia. IMPRESSION: 1. Similar volume of large LEFT sub pulmonic fluid at the lung base with diminished pleural fluid tracking over the LEFT lung apex since the prior study. 2. Moderate-sized pericardial effusion is in large slightly since the study of March 22, 2020, attention on follow-up. No signs of nodularity or pericardial enhancement. 3. Diminished RIGHT-sided pleural fluid nearly completely resolved when compared to the study of March 22, 2020 4. Post trans aortic valve replacement since previous imaging. 5. Stable low-density hepatic lesions some of which are incompletely imaged. 6. Emphysema and aortic atherosclerosis. Aortic Atherosclerosis (ICD10-I70.0) and Emphysema (ICD10-J43.9). Electronically Signed   By: Zetta Bills M.D.   On: 06/28/2020 15:02    ASSESSMENT AND PLAN: This is a  very pleasant 79 years old white male diagnosed with stage IIIb non-small cell lung cancer, adenocarcinoma with no actionable mutations  Completed the course of concurrent chemoradiation with weekly carboplatin and paclitaxel status post 5 cycles.  He has partial response to this treatment. The patient also completed consolidation treatment with immunotherapy with Imfinzi for 26 cycles. He is currently on observation and feeling fine with no concerning complaints except for the baseline shortness of breath secondary to COPD and he is currently on home oxygen. He had repeat CT scan of the chest performed recently.  I personally and independently reviewed the scans and discussed the results with the patient today. Has a scan showed no concerning findings for disease progression. I recommended for him to continue on observation with repeat CT scan of the chest in 4 months. I will also arrange for the patient to have  Port-A-Cath flush every 2 months. The patient was advised to call immediately if he has any concerning symptoms in the interval. The patient voices understanding of current disease status and treatment options and is in agreement with the current care plan.  All questions were answered. The patient knows to call the clinic with any problems, questions or concerns. We can certainly see the patient much sooner if necessary.  Disclaimer: This note was dictated with voice recognition software. Similar sounding words can inadvertently be transcribed and may not be corrected upon review.

## 2020-07-15 ENCOUNTER — Telehealth (HOSPITAL_COMMUNITY): Payer: Self-pay

## 2020-07-20 ENCOUNTER — Telehealth (HOSPITAL_COMMUNITY): Payer: Self-pay | Admitting: *Deleted

## 2020-07-27 ENCOUNTER — Other Ambulatory Visit: Payer: Self-pay

## 2020-07-27 ENCOUNTER — Encounter (HOSPITAL_COMMUNITY)
Admission: RE | Admit: 2020-07-27 | Discharge: 2020-07-27 | Disposition: A | Discharge status: Home / Self Care | Payer: Medicare HMO | Source: Ambulatory Visit | Attending: Pulmonary Disease | Admitting: Pulmonary Disease

## 2020-07-27 ENCOUNTER — Encounter (HOSPITAL_COMMUNITY): Payer: Self-pay

## 2020-07-27 VITALS — BP 102/60 | HR 91 | Resp 16 | Ht 66.0 in | Wt 155.0 lb

## 2020-07-27 DIAGNOSIS — J432 Centrilobular emphysema: Secondary | ICD-10-CM | POA: Diagnosis not present

## 2020-07-27 NOTE — Progress Notes (Signed)
Robert Dixon 79 y.o. male Pulmonary Rehab Orientation Note Robert Dixon who was referred to Pulmonary rehab by [name]  with the diagnosis of Centrilobular Emphysema arrived today in Cardiac and Pulmonary Rehab. He arrived by wheelchair from the [facility] entrance.  Pt was dropped off by his daughter in law sister. He does carry portable oxygen. [facility]  is the provider for their MDE. He is not happy with the service that he has received thus far.  Names of other home health agencies who provide oxygen therapy.  Per pt, he uses oxygen continuously other than when he is out and about in the community he uses a portable concentrator on pulsed. Color good, skin warm and dry. Patient is oriented to time and place. Patient's medical history, psychosocial health, and medications reviewed. Psychosocial assessment reveals pt lives alone. Pt is currently retired from being a Tree surgeon and long distance Administrator. Pt hobbies include watching TV especially [employer]. Pt reports his  stress level is low. Areas of stress/anxiety include everyday stressors that people have no more than usual per pt..  Pt does not exhibit signs of depression.  However pt does report that his family had noticed a change in his mood and asked his PCP for medication. Pt was placed on Celexa.  Per pt his family noticed a difference but he himself did not think he was depressed.  Pt does endorse having issues with  difficulty falling asleep, difficulty maintaining sleep and early morning awakenings.Pt does take a sleep aid which helps.  Pt does have issues with his prostrate so he gets up and down through the night to void. Pt sometimes sleeps during the day until noon.  PHQ2/9 score 0/8. Pt shows good  coping skills with positive outlook . Pt feels he is doing well with recovering from lung cancer and recent TAVR. Will continue to monitor and evaluate progress toward psychosocial goal(s) of wanting to have more energy and stamina.  Patient states he follows a Regular diet. Pt reports having a poor appetite since undergoing radiation for lung CA.  Pt often has to force himself to eat.  Pt is hopeful that by participating in pulmonary rehab will stimulate his appetite... Patient's weight will be monitored closely. Demonstration and practice of PLB using pulse oximeter. Patient able to return demonstration satisfactorily. Safety and hand hygiene in the exercise area reviewed with patient. Patient voices understanding of the information reviewed. Department expectations discussed with patient and achievable goals were set. The patient shows enthusiasm about attending the program and we look forward to working with this nice gentleman. The patient is scheduled to begin exercise on 08/03/20. 45 minutes was spent on a variety of activities such as assessment of the patient, obtaining baseline data including height, weight, BMI, and grip strength, verifying medical history, allergies, and current medications, and teaching patient strategies for performing tasks with less respiratory effort with emphasis on pursed lip breathing. Cherre Huger, BSN Cardiac and Training and development officer

## 2020-07-29 DIAGNOSIS — J439 Emphysema, unspecified: Secondary | ICD-10-CM | POA: Diagnosis not present

## 2020-07-30 DIAGNOSIS — J449 Chronic obstructive pulmonary disease, unspecified: Secondary | ICD-10-CM | POA: Diagnosis not present

## 2020-07-30 DIAGNOSIS — C3490 Malignant neoplasm of unspecified part of unspecified bronchus or lung: Secondary | ICD-10-CM | POA: Diagnosis not present

## 2020-07-30 DIAGNOSIS — I6529 Occlusion and stenosis of unspecified carotid artery: Secondary | ICD-10-CM | POA: Diagnosis not present

## 2020-07-30 DIAGNOSIS — D692 Other nonthrombocytopenic purpura: Secondary | ICD-10-CM | POA: Diagnosis not present

## 2020-07-30 DIAGNOSIS — I700 Atherosclerosis of aorta: Secondary | ICD-10-CM | POA: Diagnosis not present

## 2020-07-30 DIAGNOSIS — Z23 Encounter for immunization: Secondary | ICD-10-CM | POA: Diagnosis not present

## 2020-07-30 DIAGNOSIS — Z952 Presence of prosthetic heart valve: Secondary | ICD-10-CM | POA: Diagnosis not present

## 2020-07-30 DIAGNOSIS — D649 Anemia, unspecified: Secondary | ICD-10-CM | POA: Diagnosis not present

## 2020-07-30 DIAGNOSIS — Z1389 Encounter for screening for other disorder: Secondary | ICD-10-CM | POA: Diagnosis not present

## 2020-07-30 DIAGNOSIS — R6889 Other general symptoms and signs: Secondary | ICD-10-CM | POA: Diagnosis not present

## 2020-07-30 DIAGNOSIS — Z0000 Encounter for general adult medical examination without abnormal findings: Secondary | ICD-10-CM | POA: Diagnosis not present

## 2020-07-30 DIAGNOSIS — J9611 Chronic respiratory failure with hypoxia: Secondary | ICD-10-CM | POA: Diagnosis not present

## 2020-08-01 DIAGNOSIS — C3492 Malignant neoplasm of unspecified part of left bronchus or lung: Secondary | ICD-10-CM | POA: Diagnosis not present

## 2020-08-01 DIAGNOSIS — J432 Centrilobular emphysema: Secondary | ICD-10-CM | POA: Diagnosis not present

## 2020-08-01 DIAGNOSIS — J439 Emphysema, unspecified: Secondary | ICD-10-CM | POA: Diagnosis not present

## 2020-08-01 DIAGNOSIS — J9600 Acute respiratory failure, unspecified whether with hypoxia or hypercapnia: Secondary | ICD-10-CM | POA: Diagnosis not present

## 2020-08-01 DIAGNOSIS — C3412 Malignant neoplasm of upper lobe, left bronchus or lung: Secondary | ICD-10-CM | POA: Diagnosis not present

## 2020-08-01 DIAGNOSIS — J9611 Chronic respiratory failure with hypoxia: Secondary | ICD-10-CM | POA: Diagnosis not present

## 2020-08-01 DIAGNOSIS — J90 Pleural effusion, not elsewhere classified: Secondary | ICD-10-CM | POA: Diagnosis not present

## 2020-08-03 ENCOUNTER — Encounter (HOSPITAL_COMMUNITY)
Admission: RE | Admit: 2020-08-03 | Discharge: 2020-08-03 | Disposition: A | Discharge status: Home / Self Care | Payer: Medicare HMO | Source: Ambulatory Visit | Attending: Pulmonary Disease | Admitting: Pulmonary Disease

## 2020-08-03 ENCOUNTER — Other Ambulatory Visit: Payer: Self-pay

## 2020-08-03 VITALS — Wt 157.2 lb

## 2020-08-03 DIAGNOSIS — J432 Centrilobular emphysema: Secondary | ICD-10-CM | POA: Insufficient documentation

## 2020-08-03 NOTE — Progress Notes (Signed)
Pulmonary Individual Treatment Plan  Patient Details  Name: KALVYN DESA MRN: [id number] Date of Birth: [birth year] Referring Provider:     Pulmonary Rehab Walk Test from 07/27/2020 in [place]  Referring Provider 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  acetaminophen (TYLENOL) 325 MG tablet, Take 2 tablets (650 mg total) by mouth every 6 (six) hours as needed for mild pain (or Fever >/= 101)., Disp: , Rfl:  .  albuterol (PROVENTIL) (2.5 MG/3ML) 0.083% nebulizer solution, Take 3 mLs (2.5 mg total) by nebulization every 4 (four) hours., Disp: 1080 mL, Rfl: 5 .  albuterol (VENTOLIN HFA) 108 (90 Base) MCG/ACT inhaler, Inhale 2 puffs into the lungs every 6 (six) hours as needed for wheezing or shortness of breath., Disp: 18 g, Rfl: 5 .  alum & mag hydroxide-simeth (MAALOX/MYLANTA) 200-200-20 MG/5ML suspension, Take 15 mLs by mouth every 6 (six) hours as needed for indigestion or heartburn., Disp: 355 mL, Rfl: 0 .  ascorbic acid (VITAMIN C) 500 MG tablet, Take 500 mg by mouth daily., Disp: , Rfl:  .  aspirin 81 MG chewable tablet, Chew 1 tablet (81 mg total) by mouth daily., Disp: , Rfl:  .  atorvastatin (LIPITOR) 40 MG tablet, Take 1 tablet (40 mg total) by mouth daily., Disp: 90 tablet, Rfl: 3 .  citalopram (CELEXA) 10 MG tablet, Take 10 mg by mouth daily., Disp: , Rfl:  .  clopidogrel (PLAVIX) 75 MG tablet, Take 1 tablet (75 mg total) by mouth daily with breakfast., Disp: 90 tablet, Rfl: 3 .  Cyanocobalamin (VITAMIN B 12 PO), Take 1 capsule by mouth daily. 2500 mcg, Disp: , Rfl:  .  Fluticasone-Umeclidin-Vilant (TRELEGY ELLIPTA) 100-62.5-25 MCG/INH AEPB, Inhale 1 puff into the lungs daily., Disp: 60 each, Rfl: 0 .   furosemide (LASIX) 20 MG tablet, Take 1 tablet (20 mg total) by mouth daily., Disp: 90 tablet, Rfl: 3 .  metoprolol succinate (TOPROL-XL) 25 MG 24 hr tablet, Take 0.5 tablets (12.5 mg total) by mouth daily., Disp: 45 tablet, Rfl: 3 .  Multiple Vitamin (MULTIVITAMIN) tablet, Take 1 tablet by mouth daily., Disp: , Rfl:  .  pantoprazole (PROTONIX) 40 MG tablet, Take 40 mg by mouth 2 (two) times daily., Disp: , Rfl:  .  polycarbophil (FIBERCON) 625 MG tablet, Take 625 mg by mouth 2 (two) times daily. , Disp: , Rfl:  .  tamsulosin (FLOMAX) 0.4 MG CAPS capsule, Take 0.4 mg by mouth daily. , Disp: , Rfl:  .  TRELEGY ELLIPTA 100-62.5-25 MCG/INH AEPB, Inhale 1 puff into the lungs daily., Disp: 180 each, Rfl: 1 No current facility-administered medications for this encounter.  Facility-Administered Medications Ordered in Other Encounters:  .  sodium chloride flush (NS) 0.9 % injection 10 mL, 10 mL, Intracatheter, PRN, [name], MD .  sodium chloride flush (NS) 0.9 % injection 10 mL, 10 mL, Intracatheter, PRN, [name], MD, 10 mL at 05/20/19 1232  Past Medical History: Past Medical History:  Diagnosis Date  . Acute on chronic respiratory failure with hypoxia ([place]) 03/16/2020  . Acute respiratory failure ([place]) 04/09/2013  . Carotid artery disease ([place]) 03/28/2020   Carotid US 02/2020:  R 100%; L 1-39%  . Chronic systolic CHF 94/17/4081  Echo 02/2020: EF 30-35, small pericardial effusion, trivial MR, status post TAVR with no PVL, mean gradient 7 mmHg  . Complication of anesthesia   . COPD (chronic obstructive pulmonary disease) ([place])   . GERD (gastroesophageal reflux disease)   . Hemoptysis 11/20/2018  . History of hiatal hernia   . Hypertension   . 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 Mediastinal adenopathy 12/06/2018  . Migraine with visual aura   . 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 Non-small cell lung cancer ([place]) dx'd 11/20/18  . Pericardial effusion  10/14/2019  . Pneumonia 11/20/2018  . PONV (postoperative nausea and vomiting)   . Primary malignant neoplasm of left upper lobe of lung ([place]) 12/12/2018  . Pulmonary nodule 11/20/2018  . 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 Severe aortic stenosis 10/14/2019  . Skin cancer     Tobacco Use: Social History   Tobacco Use  Smoking Status Former Smoker  . Years: 30.00  . Types: Cigarettes  . Quit date: 2000  . Years since quitting: 21.6  Smokeless Tobacco Never Used    Labs: Recent Chemical engineer    Labs for ITP Cardiac and Pulmonary Rehab Latest Ref Rng & Units 03/19/2020 03/25/2020 03/25/2020 03/25/2020 03/25/2020   Cholestrol 0 - 200 mg/dL - - - - -   LDLCALC 0 - 99 mg/dL - - - - -   HDL >40 mg/dL - - - - -   Trlycerides <150 mg/dL - - - - -   Hemoglobin A1c 4.8 - 5.6 % - - - - -   PHART 7.35 - 7.45 - 7.474(H) - - -   PCO2ART 32 - 48 mmHg - 44.1 - - -   HCO3 20.0 - 28.0 mmol/L 28.4(H) 32.3(H) - - -   TCO2 22 - 32 mmol/L 30 34(H) 29 32 31   ACIDBASEDEF 0.0 - 2.0 mmol/L - - - - -   O2SAT % 72.0 100.0 - - -      Capillary Blood Glucose: Lab Results  Component Value Date   GLUCAP 87 12/11/2018     Pulmonary Assessment Scores:  Pulmonary Assessment Scores    Row Name 07/27/20 1623         ADL UCSD   ADL Phase Entry       mMRC Score   mMRC Score 3           UCSD: Self-administered rating of dyspnea associated with activities of daily living (ADLs) 6-point scale (0 = "not at all" to 5 = "maximal or unable to do because of breathlessness")  Scoring Scores range from 0 to 120.  Minimally important difference is 5 units  CAT: CAT can identify the health impairment of COPD patients and is better correlated with disease progression.  CAT has a scoring range of zero to 40. The CAT score is classified into four groups of low (less than 10), medium (10 - 20), high (21-30) and very high (31-40) based on the impact level of disease on  health status. A CAT score over 10 suggests significant symptoms.  A worsening CAT score could be explained by an exacerbation, poor medication adherence, poor inhaler technique, or progression of COPD or comorbid conditions.  CAT MCID is 2 points  mMRC: mMRC (Modified Medical Research Council) Dyspnea Scale is used to assess the degree of baseline functional disability in patients of respiratory disease due to dyspnea. No minimal important difference is established. A decrease in  score of 1 point or greater is considered a positive change.   Pulmonary Function Assessment:   Exercise Target Goals: Exercise Program Goal: Individual exercise prescription set using results from initial 6 min walk test and THRR while considering  patient's activity barriers and safety.   Exercise Prescription Goal: Initial exercise prescription builds to 30-45 minutes a day of aerobic activity, 2-3 days per week.  Home exercise guidelines will be given to patient during program as part of exercise prescription that the participant will acknowledge.  Activity Barriers & Risk Stratification:  Activity Barriers & Cardiac Risk Stratification - 07/27/20 1447      Activity Barriers & Cardiac Risk Stratification   Activity Barriers History of Falls;Assistive Device           6 Minute Walk:  6 Minute Walk    Row Name 07/27/20 1628         6 Minute Walk   Phase Initial     Distance 434 feet     Walk Time 6 minutes     # of Rest Breaks 0     MPH 0.82     METS 1.03     RPE 12     Perceived Dyspnea  2     VO2 Peak 3.61     Symptoms Yes (comment)     Comments Sat for the final 2 min 20 sec of test due to SOB     Resting HR 95 bpm     Resting BP 110/60     Resting Oxygen Saturation  100 %     Exercise Oxygen Saturation  during 6 min walk 90 %     Max Ex. HR 105 bpm     Max Ex. BP 126/70     2 Minute Post BP 104/62       Interval HR   1 Minute HR 102     2 Minute HR 104     3 Minute HR 105     4  Minute HR 99     5 Minute HR 93     6 Minute HR 91     2 Minute Post HR 91     Interval Heart Rate? Yes       Interval Oxygen   Interval Oxygen? Yes     Baseline Oxygen Saturation % 100 %     1 Minute Oxygen Saturation % 95 %     1 Minute Liters of Oxygen 3 L     2 Minute Oxygen Saturation % 92 %     2 Minute Liters of Oxygen 3 L     3 Minute Oxygen Saturation % 91 %     3 Minute Liters of Oxygen 3 L     4 Minute Oxygen Saturation % 90 %     4 Minute Liters of Oxygen 3 L     5 Minute Oxygen Saturation % 95 %     5 Minute Liters of Oxygen 3 L     6 Minute Oxygen Saturation % 99 %     6 Minute Liters of Oxygen 3 L     2 Minute Post Oxygen Saturation % 99 %     2 Minute Post Liters of Oxygen 3 L            Oxygen Initial Assessment:  Oxygen Initial Assessment - 07/27/20 1623      Initial 6 min Walk   Oxygen Used Continuous    Liters per  minute 3      Program Oxygen Prescription   Program Oxygen Prescription Continuous    Liters per minute 3      Intervention   Short Term Goals To learn and exhibit compliance with exercise, home and travel O2 prescription;To learn and understand importance of monitoring SPO2 with pulse oximeter and demonstrate accurate use of the pulse oximeter.;To learn and understand importance of maintaining oxygen saturations>88%;To learn and demonstrate proper pursed lip breathing techniques or other breathing techniques.;To learn and demonstrate proper use of respiratory medications    Long  Term Goals Exhibits compliance with exercise, home and travel O2 prescription;Verbalizes importance of monitoring SPO2 with pulse oximeter and return demonstration;Maintenance of O2 saturations>88%;Exhibits proper breathing techniques, such as pursed lip breathing or other method taught during program session;Compliance with respiratory medication;Demonstrates proper use of MDI's           Oxygen Re-Evaluation:   Oxygen Discharge (Final Oxygen  Re-Evaluation):   Initial Exercise Prescription:  Initial Exercise Prescription - 07/27/20 1600      Date of Initial Exercise RX and Referring Provider   Date 07/27/20    Referring Provider [name]      Oxygen   Oxygen Continuous    Liters 3      NuStep   Level 1    SPM 80    Minutes 30      Prescription Details   Frequency (times per week) 2    Duration Progress to 30 minutes of continuous aerobic without signs/symptoms of physical distress      Intensity   THRR 40-80% of Max Heartrate 56-113    Ratings of Perceived Exertion 11-13    Perceived Dyspnea 0-4      Progression   Progression Continue progressive overload as per policy without signs/symptoms or physical distress.      Resistance Training   Training Prescription Yes    Weight orange band    Reps 10-15           Perform Capillary Blood Glucose checks as needed.  Exercise Prescription Changes:  Exercise Prescription Changes    Row Name 08/03/20 1200             Response to Exercise   Blood Pressure (Admit) 110/56       Blood Pressure (Exercise) 114/70       Blood Pressure (Exit) 108/60       Heart Rate (Admit) 97 bpm       Heart Rate (Exercise) 92 bpm       Heart Rate (Exit) 91 bpm       Oxygen Saturation (Admit) 92 %       Oxygen Saturation (Exercise) 97 %       Oxygen Saturation (Exit) 91 %       Rating of Perceived Exertion (Exercise) 11       Perceived Dyspnea (Exercise) 2       Duration Progress to 30 minutes of  aerobic without signs/symptoms of physical distress       Intensity Other (comment)         Progression   Progression Continue to progress workloads to maintain intensity without signs/symptoms of physical distress.  40-80% of HRR         Resistance Training   Training Prescription Yes       Weight orange bands       Reps 10-15       Time 10 Minutes  Oxygen   Oxygen Continuous       Liters 3         NuStep   Level 1       SPM 80       Minutes 30        METs 1.4              Exercise Comments:   Exercise Goals and Review:  Exercise Goals    Row Name 07/27/20 1637             Exercise Goals   Increase Physical Activity Yes       Intervention Provide advice, education, support and counseling about physical activity/exercise needs.;Develop an individualized exercise prescription for aerobic and resistive training based on initial evaluation findings, risk stratification, comorbidities and participant's personal goals.       Expected Outcomes Short Term: Attend rehab on a regular basis to increase amount of physical activity.;Long Term: Add in home exercise to make exercise part of routine and to increase amount of physical activity.;Long Term: Exercising regularly at least 3-5 days a week.       Increase Strength and Stamina Yes       Intervention Provide advice, education, support and counseling about physical activity/exercise needs.;Develop an individualized exercise prescription for aerobic and resistive training based on initial evaluation findings, risk stratification, comorbidities and participant's personal goals.       Expected Outcomes Short Term: Increase workloads from initial exercise prescription for resistance, speed, and METs.;Short Term: Perform resistance training exercises routinely during rehab and add in resistance training at home;Long Term: Improve cardiorespiratory fitness, muscular endurance and strength as measured by increased METs and functional capacity (6MWT)       Able to understand and use rate of perceived exertion (RPE) scale Yes       Intervention Provide education and explanation on how to use RPE scale       Expected Outcomes Short Term: Able to use RPE daily in rehab to express subjective intensity level;Long Term:  Able to use RPE to guide intensity level when exercising independently       Able to understand and use Dyspnea scale Yes       Intervention Provide education and explanation on how to use  Dyspnea scale       Expected Outcomes Short Term: Able to use Dyspnea scale daily in rehab to express subjective sense of shortness of breath during exertion;Long Term: Able to use Dyspnea scale to guide intensity level when exercising independently       Knowledge and understanding of Target Heart Rate Range (THRR) Yes       Intervention Provide education and explanation of THRR including how the numbers were predicted and where they are located for reference       Expected Outcomes Short Term: Able to state/look up THRR;Short Term: Able to use daily as guideline for intensity in rehab;Long Term: Able to use THRR to govern intensity when exercising independently       Understanding of Exercise Prescription Yes       Intervention Provide education, explanation, and written materials on patient's individual exercise prescription       Expected Outcomes Short Term: Able to explain program exercise prescription;Long Term: Able to explain home exercise prescription to exercise independently              Exercise Goals Re-Evaluation :   Discharge Exercise Prescription (Final Exercise Prescription Changes):  Exercise Prescription Changes - 08/03/20  1200      Response to Exercise   Blood Pressure (Admit) 110/56    Blood Pressure (Exercise) 114/70    Blood Pressure (Exit) 108/60    Heart Rate (Admit) 97 bpm    Heart Rate (Exercise) 92 bpm    Heart Rate (Exit) 91 bpm    Oxygen Saturation (Admit) 92 %    Oxygen Saturation (Exercise) 97 %    Oxygen Saturation (Exit) 91 %    Rating of Perceived Exertion (Exercise) 11    Perceived Dyspnea (Exercise) 2    Duration Progress to 30 minutes of  aerobic without signs/symptoms of physical distress    Intensity Other (comment)      Progression   Progression Continue to progress workloads to maintain intensity without signs/symptoms of physical distress.   40-80% of HRR     Resistance Training   Training Prescription Yes    Weight orange bands     Reps 10-15    Time 10 Minutes      Oxygen   Oxygen Continuous    Liters 3      NuStep   Level 1    SPM 80    Minutes 30    METs 1.4           Nutrition:  Target Goals: Understanding of nutrition guidelines, daily intake of sodium <1567m, cholesterol <2073m calories 30% from fat and 7% or less from saturated fats, daily to have 5 or more servings of fruits and vegetables.  Biometrics:    Nutrition Therapy Plan and Nutrition Goals:   Nutrition Assessments:   Nutrition Goals Re-Evaluation:   Nutrition Goals Discharge (Final Nutrition Goals Re-Evaluation):   Psychosocial: Target Goals: Acknowledge presence or absence of significant depression and/or stress, maximize coping skills, provide positive support system. Participant is able to verbalize types and ability to use techniques and skills needed for reducing stress and depression.  Initial Review & Psychosocial Screening:  Initial Psych Review & Screening - 07/27/20 1455      Initial Review   Current issues with History of Depression;Current Sleep Concerns      Family Dynamics   Good Support System? Yes    Comments Has daughter in law sister comes twice a week to help out around the house.  Pt has two sons who live close by and are able and willing to assist. Pt on Celexa and family report that his mood has improved      Barriers   Psychosocial barriers to participate in program The patient should benefit from training in stress management and relaxation.      Screening Interventions   Interventions Encouraged to exercise           Quality of Life Scores:  Scores of 19 and below usually indicate a poorer quality of life in these areas.  A difference of  2-3 points is a clinically meaningful difference.  A difference of 2-3 points in the total score of the Quality of Life Index has been associated with significant improvement in overall quality of life, self-image, physical symptoms, and general health in  studies assessing change in quality of life.  PHQ-9: Recent Review Flowsheet Data    [facility]   Decreased Interest 0   Down, Depressed, Hopeless 0   PHQ - 2 Score 0   Altered sleeping 3   Tired, decreased energy 2   Change in appetite 2   Feeling bad or failure about yourself  0  Trouble concentrating 0   Moving slowly or fidgety/restless 1   Suicidal thoughts 0   PHQ-9 Score 8   Difficult doing work/chores Somewhat difficult     Interpretation of Total Score  Total Score Depression Severity:  1-4 = Minimal depression, 5-9 = Mild depression, 10-14 = Moderate depression, 15-19 = Moderately severe depression, 20-27 = Severe depression   Psychosocial Evaluation and Intervention:   Psychosocial Re-Evaluation:   Psychosocial Discharge (Final Psychosocial Re-Evaluation):   Education: Education Goals: Education classes will be provided on a weekly basis, covering required topics. Participant will state understanding/return demonstration of topics presented.  Learning Barriers/Preferences:  Learning Barriers/Preferences - 07/27/20 1459      Learning Barriers/Preferences   Learning Barriers Sight    Learning Preferences Audio;Computer/Internet;Group Instruction;Individual Instruction;Skilled Demonstration;Verbal Instruction;Video;Pictoral;Written Material           Education Topics: Risk Factor Reduction:  -Group instruction that is supported by a PowerPoint presentation. Instructor discusses the definition of a risk factor, different risk factors for pulmonary disease, and how the heart and lungs work together.     Nutrition for Pulmonary Patient:  -Group instruction provided by PowerPoint slides, verbal discussion, and written materials to support subject matter. The instructor gives an explanation and review of healthy diet recommendations, which includes a discussion on weight management, recommendations for fruit and vegetable consumption,  as well as protein, fluid, caffeine, fiber, sodium, sugar, and alcohol. Tips for eating when patients are short of breath are discussed.   Pursed Lip Breathing:  -Group instruction that is supported by demonstration and informational handouts. Instructor discusses the benefits of pursed lip and diaphragmatic breathing and detailed demonstration on how to preform both.     Oxygen Safety:  -Group instruction provided by PowerPoint, verbal discussion, and written material to support subject matter. There is an overview of "What is Oxygen" and "Why do we need it".  Instructor also reviews how to create a safe environment for oxygen use, the importance of using oxygen as prescribed, and the risks of noncompliance. There is a brief discussion on traveling with oxygen and resources the patient may utilize.   Oxygen Equipment:  -Group instruction provided by [facility] Staff utilizing handouts, written materials, and equipment demonstrations.   Signs and Symptoms:  -Group instruction provided by written material and verbal discussion to support subject matter. Warning signs and symptoms of infection, stroke, and heart attack are reviewed and when to call the physician/911 reinforced. Tips for preventing the spread of infection discussed.   Advanced Directives:  -Group instruction provided by verbal instruction and written material to support subject matter. Instructor reviews Advanced Directive laws and proper instruction for filling out document.   Pulmonary Video:  -Group video education that reviews the importance of medication and oxygen compliance, exercise, good nutrition, pulmonary hygiene, and pursed lip and diaphragmatic breathing for the pulmonary patient.   Exercise for the Pulmonary Patient:  -Group instruction that is supported by a PowerPoint presentation. Instructor discusses benefits of exercise, core components of exercise, frequency, duration, and intensity of an exercise  routine, importance of utilizing pulse oximetry during exercise, safety while exercising, and options of places to exercise outside of rehab.     Pulmonary Medications:  -Verbally interactive group education provided by instructor with focus on inhaled medications and proper administration.   Anatomy and Physiology of the Respiratory System and Intimacy:  -Group instruction provided by PowerPoint, verbal discussion, and written material to support subject matter. Instructor reviews respiratory cycle and anatomical components of  the respiratory system and their functions. Instructor also reviews differences in obstructive and restrictive respiratory diseases with examples of each. Intimacy, Sex, and Sexuality differences are reviewed with a discussion on how relationships can change when diagnosed with pulmonary disease. Common sexual concerns are reviewed.   MD DAY -A group question and answer session with a medical doctor that allows participants to ask questions that relate to their pulmonary disease state.   OTHER EDUCATION -Group or individual verbal, written, or video instructions that support the educational goals of the pulmonary rehab program.   PULMONARY REHAB OTHER RESPIRATORY from 08/03/2020 in [place]  Date 08/03/20  Educator Handout METs      Holiday Eating Survival Tips:  -Group instruction provided by PowerPoint slides, verbal discussion, and written materials to support subject matter. The instructor gives patients tips, tricks, and techniques to help them not only survive but enjoy the holidays despite the onslaught of food that accompanies the holidays.   Knowledge Questionnaire Score:   Core Components/Risk Factors/Patient Goals at Admission:  Personal Goals and Risk Factors at Admission - 07/27/20 1502      Core Components/Risk Factors/Patient Goals on Admission   Improve shortness of breath with ADL's Yes    Intervention Provide  education, individualized exercise plan and daily activity instruction to help decrease symptoms of SOB with activities of daily living.    Expected Outcomes Short Term: Improve cardiorespiratory fitness to achieve a reduction of symptoms when performing ADLs;Long Term: Be able to perform more ADLs without symptoms or delay the onset of symptoms           Core Components/Risk Factors/Patient Goals Review:    Core Components/Risk Factors/Patient Goals at Discharge (Final Review):    ITP Comments:   Comments:

## 2020-08-03 NOTE — Progress Notes (Signed)
Daily Session Note  Patient Details  Name: Robert Dixon MRN: [id number] Date of Birth: Sep 18, 1941 Referring Provider:     Pulmonary Rehab Walk Test from 07/27/2020 in [facility]  Referring Provider [name]      Encounter Date: 08/03/2020  Check In:  Session Check In - 08/03/20 1149      Check-In   Supervising physician immediately available to respond to emergencies [facility] immediately available    Physician(s) Dr. Cathlean Sauer    Location [facility]    Staff Present Maurice Small, RN, BSN;Noemy Hallmon Ysidro Evert, RN;David Yznaga, MS, EP-C, CCRP;Jessica Hassell Done, MS, ACSM-CEP, Exercise Physiologist    Virtual Visit No    Medication changes reported     No    Fall or balance concerns reported    No    Tobacco Cessation No Change    Warm-up and Cool-down Performed on first and last piece of equipment    Resistance Training Performed Yes    VAD Patient? No    PAD/SET Patient? No      Pain Assessment   Currently in Pain? No/denies    Pain Score 0-No pain    Multiple Pain Sites No           Capillary Blood Glucose: No results found for this or any previous visit (from the past 24 hour(s)).   Exercise Prescription Changes - 08/03/20 1200      Response to Exercise   Blood Pressure (Admit) 110/56    Blood Pressure (Exercise) 114/70    Blood Pressure (Exit) 108/60    Heart Rate (Admit) 97 bpm    Heart Rate (Exercise) 92 bpm    Heart Rate (Exit) 91 bpm    Oxygen Saturation (Admit) 92 %    Oxygen Saturation (Exercise) 97 %    Oxygen Saturation (Exit) 91 %    Rating of Perceived Exertion (Exercise) 11    Perceived Dyspnea (Exercise) 2    Duration Progress to 30 minutes of  aerobic without signs/symptoms of physical distress    Intensity Other (comment)      Progression   Progression Continue to progress workloads to maintain intensity without signs/symptoms of physical distress.   40-80% of HRR     Resistance Training    Training Prescription Yes    Weight orange bands    Reps 10-15    Time 10 Minutes      Oxygen   Oxygen Continuous    Liters 3      NuStep   Level 1    SPM 80    Minutes 30    METs 1.4           Social History   Tobacco Use  Smoking Status Former Smoker  . Years: 30.00  . Types: Cigarettes  . Quit date: 2000  . Years since quitting: 21.6  Smokeless Tobacco Never Used    Goals Met:  Exercise tolerated well No report of cardiac concerns or symptoms Strength training completed today  Goals Unmet:  Not Applicable  Comments: Service time is from 1030 to 1145    [name] is Medical Director for Cardiac Rehab at [facility]

## 2020-08-04 ENCOUNTER — Encounter (HOSPITAL_COMMUNITY): Payer: Self-pay

## 2020-08-05 ENCOUNTER — Encounter (HOSPITAL_COMMUNITY)
Admission: RE | Admit: 2020-08-05 | Discharge: 2020-08-05 | Disposition: A | Discharge status: Home / Self Care | Payer: Medicare HMO | Source: Ambulatory Visit | Attending: Pulmonary Disease | Admitting: Pulmonary Disease

## 2020-08-05 ENCOUNTER — Other Ambulatory Visit: Payer: Self-pay

## 2020-08-05 DIAGNOSIS — J432 Centrilobular emphysema: Secondary | ICD-10-CM | POA: Diagnosis not present

## 2020-08-05 NOTE — Progress Notes (Signed)
Pulmonary Individual Treatment Plan  Patient Details  Name: Robert Dixon MRN: [id number] Date of Birth: [birth year] Referring Provider:     Pulmonary Rehab Walk Test from 07/27/2020 in [place]  Referring Provider 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  acetaminophen (TYLENOL) 325 MG tablet, Take 2 tablets (650 mg total) by mouth every 6 (six) hours as needed for mild pain (or Fever >/= 101)., Disp: , Rfl:  .  albuterol (PROVENTIL) (2.5 MG/3ML) 0.083% nebulizer solution, Take 3 mLs (2.5 mg total) by nebulization every 4 (four) hours., Disp: 1080 mL, Rfl: 5 .  albuterol (VENTOLIN HFA) 108 (90 Base) MCG/ACT inhaler, Inhale 2 puffs into the lungs every 6 (six) hours as needed for wheezing or shortness of breath., Disp: 18 g, Rfl: 5 .  alum & mag hydroxide-simeth (MAALOX/MYLANTA) 200-200-20 MG/5ML suspension, Take 15 mLs by mouth every 6 (six) hours as needed for indigestion or heartburn., Disp: 355 mL, Rfl: 0 .  ascorbic acid (VITAMIN C) 500 MG tablet, Take 500 mg by mouth daily., Disp: , Rfl:  .  aspirin 81 MG chewable tablet, Chew 1 tablet (81 mg total) by mouth daily., Disp: , Rfl:  .  atorvastatin (LIPITOR) 40 MG tablet, Take 1 tablet (40 mg total) by mouth daily., Disp: 90 tablet, Rfl: 3 .  citalopram (CELEXA) 10 MG tablet, Take 10 mg by mouth daily., Disp: , Rfl:  .  clopidogrel (PLAVIX) 75 MG tablet, Take 1 tablet (75 mg total) by mouth daily with breakfast., Disp: 90 tablet, Rfl: 3 .  Cyanocobalamin (VITAMIN B 12 PO), Take 1 capsule by mouth daily. 2500 mcg, Disp: , Rfl:  .  Fluticasone-Umeclidin-Vilant (TRELEGY ELLIPTA) 100-62.5-25 MCG/INH AEPB, Inhale 1 puff into the lungs daily., Disp: 60 each, Rfl: 0 .   furosemide (LASIX) 20 MG tablet, Take 1 tablet (20 mg total) by mouth daily., Disp: 90 tablet, Rfl: 3 .  metoprolol succinate (TOPROL-XL) 25 MG 24 hr tablet, Take 0.5 tablets (12.5 mg total) by mouth daily., Disp: 45 tablet, Rfl: 3 .  Multiple Vitamin (MULTIVITAMIN) tablet, Take 1 tablet by mouth daily., Disp: , Rfl:  .  pantoprazole (PROTONIX) 40 MG tablet, Take 40 mg by mouth 2 (two) times daily., Disp: , Rfl:  .  polycarbophil (FIBERCON) 625 MG tablet, Take 625 mg by mouth 2 (two) times daily. , Disp: , Rfl:  .  tamsulosin (FLOMAX) 0.4 MG CAPS capsule, Take 0.4 mg by mouth daily. , Disp: , Rfl:  .  TRELEGY ELLIPTA 100-62.5-25 MCG/INH AEPB, Inhale 1 puff into the lungs daily., Disp: 180 each, Rfl: 1 No current facility-administered medications for this encounter.  Facility-Administered Medications Ordered in Other Encounters:  .  sodium chloride flush (NS) 0.9 % injection 10 mL, 10 mL, Intracatheter, PRN, [name], MD .  sodium chloride flush (NS) 0.9 % injection 10 mL, 10 mL, Intracatheter, PRN, [name], MD, 10 mL at 05/20/19 1232  Past Medical History: Past Medical History:  Diagnosis Date  . Acute on chronic respiratory failure with hypoxia ([place]) 03/16/2020  . Acute respiratory failure ([name]) 04/09/2013  . Carotid artery disease ([place]) 03/28/2020   Carotid US 02/2020:  R 100%; L 1-39%  . Chronic systolic CHF 62/70/3500  Echo 02/2020: EF 30-35, small pericardial effusion, trivial MR, status post TAVR with no PVL, mean gradient 7 mmHg  . Complication of anesthesia   . COPD (chronic obstructive pulmonary disease) ([place])   . GERD (gastroesophageal reflux disease)   . Hemoptysis 11/20/2018  . History of hiatal hernia   . Hypertension   . 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 Mediastinal adenopathy 12/06/2018  . Migraine with visual aura   . 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 Non-small cell lung cancer ([place]) dx'd 11/20/18  . Pericardial effusion  10/14/2019  . Pneumonia 11/20/2018  . PONV (postoperative nausea and vomiting)   . Primary malignant neoplasm of left upper lobe of lung ([place]) 12/12/2018  . Pulmonary nodule 11/20/2018  . 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 Severe aortic stenosis 10/14/2019  . Skin cancer     Tobacco Use: Social History   Tobacco Use  Smoking Status Former Smoker  . Years: 30.00  . Types: Cigarettes  . Quit date: 2000  . Years since quitting: 21.7  Smokeless Tobacco Never Used    Labs: Recent Chemical engineer    Labs for ITP Cardiac and Pulmonary Rehab Latest Ref Rng & Units 03/19/2020 03/25/2020 03/25/2020 03/25/2020 03/25/2020   Cholestrol 0 - 200 mg/dL - - - - -   LDLCALC 0 - 99 mg/dL - - - - -   HDL >40 mg/dL - - - - -   Trlycerides <150 mg/dL - - - - -   Hemoglobin A1c 4.8 - 5.6 % - - - - -   PHART 7.35 - 7.45 - 7.474(H) - - -   PCO2ART 32 - 48 mmHg - 44.1 - - -   HCO3 20.0 - 28.0 mmol/L 28.4(H) 32.3(H) - - -   TCO2 22 - 32 mmol/L 30 34(H) 29 32 31   ACIDBASEDEF 0.0 - 2.0 mmol/L - - - - -   O2SAT % 72.0 100.0 - - -      Capillary Blood Glucose: Lab Results  Component Value Date   GLUCAP 87 12/11/2018     Pulmonary Assessment Scores:  Pulmonary Assessment Scores    Row Name 07/27/20 1623         ADL UCSD   ADL Phase Entry       mMRC Score   mMRC Score 3           UCSD: Self-administered rating of dyspnea associated with activities of daily living (ADLs) 6-point scale (0 = "not at all" to 5 = "maximal or unable to do because of breathlessness")  Scoring Scores range from 0 to 120.  Minimally important difference is 5 units  CAT: CAT can identify the health impairment of COPD patients and is better correlated with disease progression.  CAT has a scoring range of zero to 40. The CAT score is classified into four groups of low (less than 10), medium (10 - 20), high (21-30) and very high (31-40) based on the impact level of disease on  health status. A CAT score over 10 suggests significant symptoms.  A worsening CAT score could be explained by an exacerbation, poor medication adherence, poor inhaler technique, or progression of COPD or comorbid conditions.  CAT MCID is 2 points  mMRC: mMRC (Modified Medical Research Council) Dyspnea Scale is used to assess the degree of baseline functional disability in patients of respiratory disease due to dyspnea. No minimal important difference is established. A decrease in  score of 1 point or greater is considered a positive change.   Pulmonary Function Assessment:   Exercise Target Goals: Exercise Program Goal: Individual exercise prescription set using results from initial 6 min walk test and THRR while considering  patient's activity barriers and safety.   Exercise Prescription Goal: Initial exercise prescription builds to 30-45 minutes a day of aerobic activity, 2-3 days per week.  Home exercise guidelines will be given to patient during program as part of exercise prescription that the participant will acknowledge.  Activity Barriers & Risk Stratification:  Activity Barriers & Cardiac Risk Stratification - 07/27/20 1447      Activity Barriers & Cardiac Risk Stratification   Activity Barriers History of Falls;Assistive Device           6 Minute Walk:  6 Minute Walk    Row Name 07/27/20 1628         6 Minute Walk   Phase Initial     Distance 434 feet     Walk Time 6 minutes     # of Rest Breaks 0     MPH 0.82     METS 1.03     RPE 12     Perceived Dyspnea  2     VO2 Peak 3.61     Symptoms Yes (comment)     Comments Sat for the final 2 min 20 sec of test due to SOB     Resting HR 95 bpm     Resting BP 110/60     Resting Oxygen Saturation  100 %     Exercise Oxygen Saturation  during 6 min walk 90 %     Max Ex. HR 105 bpm     Max Ex. BP 126/70     2 Minute Post BP 104/62       Interval HR   1 Minute HR 102     2 Minute HR 104     3 Minute HR 105     4  Minute HR 99     5 Minute HR 93     6 Minute HR 91     2 Minute Post HR 91     Interval Heart Rate? Yes       Interval Oxygen   Interval Oxygen? Yes     Baseline Oxygen Saturation % 100 %     1 Minute Oxygen Saturation % 95 %     1 Minute Liters of Oxygen 3 L     2 Minute Oxygen Saturation % 92 %     2 Minute Liters of Oxygen 3 L     3 Minute Oxygen Saturation % 91 %     3 Minute Liters of Oxygen 3 L     4 Minute Oxygen Saturation % 90 %     4 Minute Liters of Oxygen 3 L     5 Minute Oxygen Saturation % 95 %     5 Minute Liters of Oxygen 3 L     6 Minute Oxygen Saturation % 99 %     6 Minute Liters of Oxygen 3 L     2 Minute Post Oxygen Saturation % 99 %     2 Minute Post Liters of Oxygen 3 L            Oxygen Initial Assessment:  Oxygen Initial Assessment - 07/27/20 1623      Initial 6 min Walk   Oxygen Used Continuous    Liters per  minute 3      Program Oxygen Prescription   Program Oxygen Prescription Continuous    Liters per minute 3      Intervention   Short Term Goals To learn and exhibit compliance with exercise, home and travel O2 prescription;To learn and understand importance of monitoring SPO2 with pulse oximeter and demonstrate accurate use of the pulse oximeter.;To learn and understand importance of maintaining oxygen saturations>88%;To learn and demonstrate proper pursed lip breathing techniques or other breathing techniques.;To learn and demonstrate proper use of respiratory medications    Long  Term Goals Exhibits compliance with exercise, home and travel O2 prescription;Verbalizes importance of monitoring SPO2 with pulse oximeter and return demonstration;Maintenance of O2 saturations>88%;Exhibits proper breathing techniques, such as pursed lip breathing or other method taught during program session;Compliance with respiratory medication;Demonstrates proper use of MDI's           Oxygen Re-Evaluation:   Oxygen Discharge (Final Oxygen  Re-Evaluation):   Initial Exercise Prescription:  Initial Exercise Prescription - 07/27/20 1600      Date of Initial Exercise RX and Referring Provider   Date 07/27/20    Referring Provider [name]      Oxygen   Oxygen Continuous    Liters 3      NuStep   Level 1    SPM 80    Minutes 30      Prescription Details   Frequency (times per week) 2    Duration Progress to 30 minutes of continuous aerobic without signs/symptoms of physical distress      Intensity   THRR 40-80% of Max Heartrate 56-113    Ratings of Perceived Exertion 11-13    Perceived Dyspnea 0-4      Progression   Progression Continue progressive overload as per policy without signs/symptoms or physical distress.      Resistance Training   Training Prescription Yes    Weight orange band    Reps 10-15           Perform Capillary Blood Glucose checks as needed.  Exercise Prescription Changes:  Exercise Prescription Changes    Row Name 08/03/20 1200             Response to Exercise   Blood Pressure (Admit) 110/56       Blood Pressure (Exercise) 114/70       Blood Pressure (Exit) 108/60       Heart Rate (Admit) 97 bpm       Heart Rate (Exercise) 92 bpm       Heart Rate (Exit) 91 bpm       Oxygen Saturation (Admit) 92 %       Oxygen Saturation (Exercise) 97 %       Oxygen Saturation (Exit) 91 %       Rating of Perceived Exertion (Exercise) 11       Perceived Dyspnea (Exercise) 2       Duration Progress to 30 minutes of  aerobic without signs/symptoms of physical distress       Intensity Other (comment)         Progression   Progression Continue to progress workloads to maintain intensity without signs/symptoms of physical distress.  40-80% of HRR         Resistance Training   Training Prescription Yes       Weight orange bands       Reps 10-15       Time 10 Minutes  Oxygen   Oxygen Continuous       Liters 3         NuStep   Level 1       SPM 80       Minutes 30        METs 1.4              Exercise Comments:   Exercise Goals and Review:  Exercise Goals    Row Name 07/27/20 1637             Exercise Goals   Increase Physical Activity Yes       Intervention Provide advice, education, support and counseling about physical activity/exercise needs.;Develop an individualized exercise prescription for aerobic and resistive training based on initial evaluation findings, risk stratification, comorbidities and participant's personal goals.       Expected Outcomes Short Term: Attend rehab on a regular basis to increase amount of physical activity.;Long Term: Add in home exercise to make exercise part of routine and to increase amount of physical activity.;Long Term: Exercising regularly at least 3-5 days a week.       Increase Strength and Stamina Yes       Intervention Provide advice, education, support and counseling about physical activity/exercise needs.;Develop an individualized exercise prescription for aerobic and resistive training based on initial evaluation findings, risk stratification, comorbidities and participant's personal goals.       Expected Outcomes Short Term: Increase workloads from initial exercise prescription for resistance, speed, and METs.;Short Term: Perform resistance training exercises routinely during rehab and add in resistance training at home;Long Term: Improve cardiorespiratory fitness, muscular endurance and strength as measured by increased METs and functional capacity (6MWT)       Able to understand and use rate of perceived exertion (RPE) scale Yes       Intervention Provide education and explanation on how to use RPE scale       Expected Outcomes Short Term: Able to use RPE daily in rehab to express subjective intensity level;Long Term:  Able to use RPE to guide intensity level when exercising independently       Able to understand and use Dyspnea scale Yes       Intervention Provide education and explanation on how to use  Dyspnea scale       Expected Outcomes Short Term: Able to use Dyspnea scale daily in rehab to express subjective sense of shortness of breath during exertion;Long Term: Able to use Dyspnea scale to guide intensity level when exercising independently       Knowledge and understanding of Target Heart Rate Range (THRR) Yes       Intervention Provide education and explanation of THRR including how the numbers were predicted and where they are located for reference       Expected Outcomes Short Term: Able to state/look up THRR;Short Term: Able to use daily as guideline for intensity in rehab;Long Term: Able to use THRR to govern intensity when exercising independently       Understanding of Exercise Prescription Yes       Intervention Provide education, explanation, and written materials on patient's individual exercise prescription       Expected Outcomes Short Term: Able to explain program exercise prescription;Long Term: Able to explain home exercise prescription to exercise independently              Exercise Goals Re-Evaluation :   Discharge Exercise Prescription (Final Exercise Prescription Changes):  Exercise Prescription Changes - 08/03/20  1200      Response to Exercise   Blood Pressure (Admit) 110/56    Blood Pressure (Exercise) 114/70    Blood Pressure (Exit) 108/60    Heart Rate (Admit) 97 bpm    Heart Rate (Exercise) 92 bpm    Heart Rate (Exit) 91 bpm    Oxygen Saturation (Admit) 92 %    Oxygen Saturation (Exercise) 97 %    Oxygen Saturation (Exit) 91 %    Rating of Perceived Exertion (Exercise) 11    Perceived Dyspnea (Exercise) 2    Duration Progress to 30 minutes of  aerobic without signs/symptoms of physical distress    Intensity Other (comment)      Progression   Progression Continue to progress workloads to maintain intensity without signs/symptoms of physical distress.   40-80% of HRR     Resistance Training   Training Prescription Yes    Weight orange bands     Reps 10-15    Time 10 Minutes      Oxygen   Oxygen Continuous    Liters 3      NuStep   Level 1    SPM 80    Minutes 30    METs 1.4           Nutrition:  Target Goals: Understanding of nutrition guidelines, daily intake of sodium <1553m, cholesterol <2040m calories 30% from fat and 7% or less from saturated fats, daily to have 5 or more servings of fruits and vegetables.  Biometrics:    Nutrition Therapy Plan and Nutrition Goals:   Nutrition Assessments:   Nutrition Goals Re-Evaluation:   Nutrition Goals Discharge (Final Nutrition Goals Re-Evaluation):   Psychosocial: Target Goals: Acknowledge presence or absence of significant depression and/or stress, maximize coping skills, provide positive support system. Participant is able to verbalize types and ability to use techniques and skills needed for reducing stress and depression.  Initial Review & Psychosocial Screening:  Initial Psych Review & Screening - 07/27/20 1455      Initial Review   Current issues with History of Depression;Current Sleep Concerns      Family Dynamics   Good Support System? Yes    Comments Has daughter in law sister comes twice a week to help out around the house.  Pt has two sons who live close by and are able and willing to assist. Pt on Celexa and family report that his mood has improved      Barriers   Psychosocial barriers to participate in program The patient should benefit from training in stress management and relaxation.      Screening Interventions   Interventions Encouraged to exercise           Quality of Life Scores:  Scores of 19 and below usually indicate a poorer quality of life in these areas.  A difference of  2-3 points is a clinically meaningful difference.  A difference of 2-3 points in the total score of the Quality of Life Index has been associated with significant improvement in overall quality of life, self-image, physical symptoms, and general health in  studies assessing change in quality of life.  PHQ-9: Recent Review Flowsheet Data    [facility] 07/[facility]/2020   Decreased Interest 0   Down, Depressed, Hopeless 0   PHQ - 2 Score 0   Altered sleeping 3   Tired, decreased energy 2   Change in appetite 2   Feeling bad or failure about yourself  0  Trouble concentrating 0   Moving slowly or fidgety/restless 1   Suicidal thoughts 0   PHQ-9 Score 8   Difficult doing work/chores Somewhat difficult     Interpretation of Total Score  Total Score Depression Severity:  1-4 = Minimal depression, 5-9 = Mild depression, 10-14 = Moderate depression, 15-19 = Moderately severe depression, 20-27 = Severe depression   Psychosocial Evaluation and Intervention:   Psychosocial Re-Evaluation:  Psychosocial Re-Evaluation    Row Name 08/04/20 1457 08/04/20 1632           Psychosocial Re-Evaluation   Current issues with History of Depression;Current Sleep Concerns --      Comments Pt with improved management of his depresion through medication managament - Celexa.  Pt family has noted that his modd has improved.  Pt has completed 1 exercise session and has a quirky dry sense of humor that can make assesment of his mental well being difficult..  Will contiue to probe further. [name] with improved management of his depresion through medication managament - Celexa.  Pt family has noted that his modd has improved.  Pt has completed 1 exercise session and has a quirky dry sense of humor that can make assesment of his mental well being difficult..  Will contiue to probe further.      Expected Outcomes Pt will endorse well managment of his depression through medication and increased physical activity. [name]  will endorse well managment of his depression through medication and increased physical activity.      Interventions Stress management education;Relaxation education;Encouraged to attend Pulmonary Rehabilitation for the exercise --      Continue  Psychosocial Services  Follow up required by staff --             Psychosocial Discharge (Final Psychosocial Re-Evaluation):  Psychosocial Re-Evaluation - 08/04/20 1632      Psychosocial Re-Evaluation   Comments [name] with improved management of his depresion through medication managament - Celexa.  Pt family has noted that his modd has improved.  Pt has completed 1 exercise session and has a quirky dry sense of humor that can make assesment of his mental well being difficult..  Will contiue to probe further.    Expected Outcomes [name]  will endorse well managment of his depression through medication and increased physical activity.           Education: Education Goals: Education classes will be provided on a weekly basis, covering required topics. Participant will state understanding/return demonstration of topics presented.  Learning Barriers/Preferences:  Learning Barriers/Preferences - 07/27/20 1459      Learning Barriers/Preferences   Learning Barriers Sight    Learning Preferences Audio;Computer/Internet;Group Instruction;Individual Instruction;Skilled Demonstration;Verbal Instruction;Video;Pictoral;Written Material           Education Topics: Risk Factor Reduction:  -Group instruction that is supported by a PowerPoint presentation. Instructor discusses the definition of a risk factor, different risk factors for pulmonary disease, and how the heart and lungs work together.     Nutrition for Pulmonary Patient:  -Group instruction provided by PowerPoint slides, verbal discussion, and written materials to support subject matter. The instructor gives an explanation and review of healthy diet recommendations, which includes a discussion on weight management, recommendations for fruit and vegetable consumption, as well as protein, fluid, caffeine, fiber, sodium, sugar, and alcohol. Tips for eating when patients are short of breath are discussed.   Pursed Lip Breathing:  -Group  instruction that is supported by demonstration and informational handouts. Instructor discusses the benefits of pursed  lip and diaphragmatic breathing and detailed demonstration on how to preform both.     Oxygen Safety:  -Group instruction provided by PowerPoint, verbal discussion, and written material to support subject matter. There is an overview of "What is Oxygen" and "Why do we need it".  Instructor also reviews how to create a safe environment for oxygen use, the importance of using oxygen as prescribed, and the risks of noncompliance. There is a brief discussion on traveling with oxygen and resources the patient may utilize.   Oxygen Equipment:  -Group instruction provided by [facility] Staff utilizing handouts, written materials, and equipment demonstrations.   Signs and Symptoms:  -Group instruction provided by written material and verbal discussion to support subject matter. Warning signs and symptoms of infection, stroke, and heart attack are reviewed and when to call the physician/911 reinforced. Tips for preventing the spread of infection discussed.   Advanced Directives:  -Group instruction provided by verbal instruction and written material to support subject matter. Instructor reviews Advanced Directive laws and proper instruction for filling out document.   Pulmonary Video:  -Group video education that reviews the importance of medication and oxygen compliance, exercise, good nutrition, pulmonary hygiene, and pursed lip and diaphragmatic breathing for the pulmonary patient.   Exercise for the Pulmonary Patient:  -Group instruction that is supported by a PowerPoint presentation. Instructor discusses benefits of exercise, core components of exercise, frequency, duration, and intensity of an exercise routine, importance of utilizing pulse oximetry during exercise, safety while exercising, and options of places to exercise outside of rehab.     Pulmonary Medications:   -Verbally interactive group education provided by instructor with focus on inhaled medications and proper administration.   Anatomy and Physiology of the Respiratory System and Intimacy:  -Group instruction provided by PowerPoint, verbal discussion, and written material to support subject matter. Instructor reviews respiratory cycle and anatomical components of the respiratory system and their functions. Instructor also reviews differences in obstructive and restrictive respiratory diseases with examples of each. Intimacy, Sex, and Sexuality differences are reviewed with a discussion on how relationships can change when diagnosed with pulmonary disease. Common sexual concerns are reviewed.   MD DAY -A group question and answer session with a medical doctor that allows participants to ask questions that relate to their pulmonary disease state.   OTHER EDUCATION -Group or individual verbal, written, or video instructions that support the educational goals of the pulmonary rehab program.   PULMONARY REHAB OTHER RESPIRATORY from 08/03/2020 in [place]  Date 08/03/20  Educator Handout METs      Holiday Eating Survival Tips:  -Group instruction provided by PowerPoint slides, verbal discussion, and written materials to support subject matter. The instructor gives patients tips, tricks, and techniques to help them not only survive but enjoy the holidays despite the onslaught of food that accompanies the holidays.   Knowledge Questionnaire Score:   Core Components/Risk Factors/Patient Goals at Admission:  Personal Goals and Risk Factors at Admission - 08/04/20 1501      Core Components/Risk Factors/Patient Goals on Admission    Weight Management Weight Gain           Core Components/Risk Factors/Patient Goals Review:   Goals and Risk Factor Review    Row Name 08/04/20 1502             Core Components/Risk Factors/Patient Goals Review   Personal  Goals Review Increase knowledge of respiratory medications and ability to use respiratory devices properly.;Develop more efficient  breathing techniques such as purse lipped breathing and diaphragmatic breathing and practicing self-pacing with activity.;Improve shortness of breath with ADL's;Weight Management/Obesity       Review Pt has completed 1 exercise session.  Pt has chronic issues with stomach and dizziness. Pt desires to gain weight.  Pt will meet with Ailene Ravel RD on techniques pt can use to stimulate his appetite and increase his caloric intake.  Will continue to review with pt PLB although at times it is difficult to assess whether he understands due to his dry humor.       Expected Outcomes See Admission Goals              Core Components/Risk Factors/Patient Goals at Discharge (Final Review):   Goals and Risk Factor Review - 08/04/20 1502      Core Components/Risk Factors/Patient Goals Review   Personal Goals Review Increase knowledge of respiratory medications and ability to use respiratory devices properly.;Develop more efficient breathing techniques such as purse lipped breathing and diaphragmatic breathing and practicing self-pacing with activity.;Improve shortness of breath with ADL's;Weight Management/Obesity    Review Pt has completed 1 exercise session.  Pt has chronic issues with stomach and dizziness. Pt desires to gain weight.  Pt will meet with Ailene Ravel RD on techniques pt can use to stimulate his appetite and increase his caloric intake.  Will continue to review with pt PLB although at times it is difficult to assess whether he understands due to his dry humor.    Expected Outcomes See Admission Goals           ITP Comments:   Comments:  [name] has completed 1 exercise session in Pulmonary rehab.  Pulmonary rehab staff will continue to monitor and reassess progress toward goals during her participation in Pulmonary Rehab. Cherre Huger, BSN Cardiac and Dealer

## 2020-08-05 NOTE — Progress Notes (Signed)
Daily Session Note  Patient Details  Name: Robert Dixon MRN: [id number] Date of Birth: [birth year] Referring Provider:     Pulmonary Rehab Walk Test from 07/27/2020 in [place]  Referring Provider [name]      Encounter Date: 08/05/2020  Check In:  Session Check In - 08/05/20 1154      Check-In   Supervising physician immediately available to respond to emergencies [facility] immediately available    Physician(s) Dr. Cathlean Sauer    Location [facility]    Staff Present Rodney Langton, RN;Josafat Enrico Hassell Done, MS, ACSM-CEP, Exercise Physiologist;Portia Rollene Rotunda, RN, BSN    Virtual Visit No    Medication changes reported     No    Fall or balance concerns reported    No    Tobacco Cessation No Change    Warm-up and Cool-down Performed on first and last piece of equipment    Resistance Training Performed Yes    VAD Patient? No    PAD/SET Patient? No      Pain Assessment   Currently in Pain? No/denies    Pain Score 0-No pain    Multiple Pain Sites No           Capillary Blood Glucose: No results found for this or any previous visit (from the past 24 hour(s)).    Social History   Tobacco Use  Smoking Status Former Smoker  . Years: 30.00  . Types: Cigarettes  . Quit date: 2000  . Years since quitting: 21.7  Smokeless Tobacco Never Used    Goals Met:  Proper associated with RPD/PD & O2 Sat Exercise tolerated well No report of cardiac concerns or symptoms Strength training completed today  Goals Unmet:  Not Applicable  Comments: Service time is from 1035 to 1145    [name] is Medical Director for Cardiac Rehab at [facility].

## 2020-08-10 ENCOUNTER — Encounter (HOSPITAL_COMMUNITY)
Admission: RE | Admit: 2020-08-10 | Discharge: 2020-08-10 | Disposition: A | Discharge status: Home / Self Care | Payer: Medicare HMO | Source: Ambulatory Visit | Attending: Pulmonary Disease | Admitting: Pulmonary Disease

## 2020-08-10 ENCOUNTER — Other Ambulatory Visit: Payer: Self-pay

## 2020-08-10 DIAGNOSIS — J432 Centrilobular emphysema: Secondary | ICD-10-CM

## 2020-08-10 NOTE — Progress Notes (Signed)
Daily Session Note  Patient Details  Name: Robert Dixon MRN: [id number] Date of Birth: 1941-03-23 Referring Provider:     Pulmonary Rehab Walk Test from 07/27/2020 in [place]  Referring Provider [name]      Encounter Date: 08/10/2020  Check In:  Session Check In - 08/10/20 1104      Check-In   Supervising physician immediately available to respond to emergencies [facility] immediately available    Physician(s) Dr. Cathlean Sauer    [facility]    Staff Present Maurice Small, RN, Bjorn Loser, MS, CEP, Exercise Physiologist;Lisa Jani Gravel, MS, ACSM-CEP, Exercise Physiologist    Virtual Visit No    Medication changes reported     No    Fall or balance concerns reported    No    Tobacco Cessation No Change    Warm-up and Cool-down Performed on first and last piece of equipment    Resistance Training Performed Yes    VAD Patient? No    PAD/SET Patient? No      Pain Assessment   Currently in Pain? No/denies    Pain Score 0-No pain    Multiple Pain Sites No           Capillary Blood Glucose: No results found for this or any previous visit (from the past 24 hour(s)).    Social History   Tobacco Use  Smoking Status Former Smoker  . Years: 30.00  . Types: Cigarettes  . Quit date: 2000  . Years since quitting: 21.7  Smokeless Tobacco Never Used    Goals Met:  Proper associated with RPD/PD & O2 Sat Exercise tolerated well No report of cardiac concerns or symptoms Strength training completed today  Goals Unmet:  Not Applicable  Comments: Service time is from 1045 to 1148    [name] is Medical Director for Cardiac Rehab at [facility].

## 2020-08-12 ENCOUNTER — Other Ambulatory Visit: Payer: Self-pay

## 2020-08-12 ENCOUNTER — Encounter (HOSPITAL_COMMUNITY)
Admission: RE | Admit: 2020-08-12 | Discharge: 2020-08-12 | Disposition: A | Discharge status: Home / Self Care | Payer: Medicare HMO | Source: Ambulatory Visit | Attending: Pulmonary Disease | Admitting: Pulmonary Disease

## 2020-08-12 ENCOUNTER — Inpatient Hospital Stay: Payer: Medicare HMO | Attending: Internal Medicine

## 2020-08-12 DIAGNOSIS — Z95828 Presence of other vascular implants and grafts: Secondary | ICD-10-CM

## 2020-08-12 DIAGNOSIS — C3432 Malignant neoplasm of lower lobe, left bronchus or lung: Secondary | ICD-10-CM | POA: Diagnosis not present

## 2020-08-12 DIAGNOSIS — Z9221 Personal history of antineoplastic chemotherapy: Secondary | ICD-10-CM | POA: Insufficient documentation

## 2020-08-12 DIAGNOSIS — Z452 Encounter for adjustment and management of vascular access device: Secondary | ICD-10-CM | POA: Insufficient documentation

## 2020-08-12 DIAGNOSIS — J432 Centrilobular emphysema: Secondary | ICD-10-CM | POA: Diagnosis not present

## 2020-08-12 DIAGNOSIS — Z923 Personal history of irradiation: Secondary | ICD-10-CM | POA: Insufficient documentation

## 2020-08-12 MED ORDER — HEPARIN SOD (PORK) LOCK FLUSH 100 UNIT/ML IV SOLN
500.0000 [IU] | Freq: Once | INTRAVENOUS | Status: AC | PRN
  Administered 2020-08-12: 500 [IU]
  Filled 2020-08-12: qty 5

## 2020-08-12 MED ORDER — SODIUM CHLORIDE 0.9% FLUSH
10.0000 mL | INTRAVENOUS | Status: DC | PRN
  Administered 2020-08-12: 10 mL
  Filled 2020-08-12: qty 10

## 2020-08-12 NOTE — Progress Notes (Signed)
Robert Dixon 79 y.o. male Nutrition Note  Visit Diagnosis: Centriacinar emphysema [facility])   Past Medical History:  Diagnosis Date  . Acute on chronic respiratory failure with hypoxia ([place]) 03/16/2020  . Acute respiratory failure ([place]) 04/09/2013  . Carotid artery disease ([place]) 03/28/2020   Carotid US 02/2020:  R 100%; L 1-39%  . Chronic systolic CHF 25/36/6440   Echo 02/2020: EF 30-35, small pericardial effusion, trivial MR, status post TAVR with no PVL, mean gradient 7 mmHg  . Complication of anesthesia   . COPD (chronic obstructive pulmonary disease) ([place])   . GERD (gastroesophageal reflux disease)   . Hemoptysis 11/20/2018  . History of hiatal hernia   . Hypertension   . 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 Mediastinal adenopathy 12/06/2018  . Migraine with visual aura   . Non-small cell carcinoma of left lung, stage 3 ([place]) 12/19/2018  . Non-small cell lung cancer ([place]) dx'd 11/20/18  . Pericardial effusion 10/14/2019  . Pneumonia 11/20/2018  . PONV (postoperative nausea and vomiting)   . Primary malignant neoplasm of left upper lobe of lung ([place]) 12/12/2018  . Pulmonary nodule 11/20/2018  . 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 Severe aortic stenosis 10/14/2019  . Skin cancer      Medications reviewed.   Current Outpatient Medications:  .  acetaminophen (TYLENOL) 325 MG tablet, Take 2 tablets (650 mg total) by mouth every 6 (six) hours as needed for mild pain (or Fever >/= 101)., Disp: , Rfl:  .  albuterol (PROVENTIL) (2.5 MG/3ML) 0.083% nebulizer solution, Take 3 mLs (2.5 mg total) by nebulization every 4 (four) hours., Disp: 1080 mL, Rfl: 5 .  albuterol (VENTOLIN HFA) 108 (90 Base) MCG/ACT inhaler, Inhale 2 puffs into the lungs every 6 (six) hours as needed for wheezing or shortness of breath., Disp: 18 g, Rfl: 5 .  alum & mag hydroxide-simeth (MAALOX/MYLANTA) 200-200-20 MG/5ML suspension, Take 15 mLs by mouth every 6 (six) hours as needed for  indigestion or heartburn., Disp: 355 mL, Rfl: 0 .  ascorbic acid (VITAMIN C) 500 MG tablet, Take 500 mg by mouth daily., Disp: , Rfl:  .  aspirin 81 MG chewable tablet, Chew 1 tablet (81 mg total) by mouth daily., Disp: , Rfl:  .  atorvastatin (LIPITOR) 40 MG tablet, Take 1 tablet (40 mg total) by mouth daily., Disp: 90 tablet, Rfl: 3 .  citalopram (CELEXA) 10 MG tablet, Take 10 mg by mouth daily., Disp: , Rfl:  .  clopidogrel (PLAVIX) 75 MG tablet, Take 1 tablet (75 mg total) by mouth daily with breakfast., Disp: 90 tablet, Rfl: 3 .  Cyanocobalamin (VITAMIN B 12 PO), Take 1 capsule by mouth daily. 2500 mcg, Disp: , Rfl:  .  Fluticasone-Umeclidin-Vilant (TRELEGY ELLIPTA) 100-62.5-25 MCG/INH AEPB, Inhale 1 puff into the lungs daily., Disp: 60 each, Rfl: 0 .  furosemide (LASIX) 20 MG tablet, Take 1 tablet (20 mg total) by mouth daily., Disp: 90 tablet, Rfl: 3 .  metoprolol succinate (TOPROL-XL) 25 MG 24 hr tablet, Take 0.5 tablets (12.5 mg total) by mouth daily., Disp: 45 tablet, Rfl: 3 .  Multiple Vitamin (MULTIVITAMIN) tablet, Take 1 tablet by mouth daily., Disp: , Rfl:  .  pantoprazole (PROTONIX) 40 MG tablet, Take 40 mg by mouth 2 (two) times daily., Disp: , Rfl:  .  polycarbophil (FIBERCON) 625 MG tablet, Take 625 mg by mouth 2 (two) times daily. , Disp: , Rfl:  .  tamsulosin (FLOMAX) 0.4 MG CAPS capsule, Take 0.4  mg by mouth daily. , Disp: , Rfl:  .  TRELEGY ELLIPTA 100-62.5-25 MCG/INH AEPB, Inhale 1 puff into the lungs daily., Disp: 180 each, Rfl: 1 No current facility-administered medications for this encounter.  Facility-Administered Medications Ordered in Other Encounters:  .  sodium chloride flush (NS) 0.9 % injection 10 mL, 10 mL, Intracatheter, PRN, [name], MD .  sodium chloride flush (NS) 0.9 % injection 10 mL, 10 mL, Intracatheter, PRN, [name], MD, 10 mL at 05/20/19 1232   Ht Readings from Last 1 Encounters:  07/27/20 5\' 6"  (1.676 m)     Wt Readings from Last  3 Encounters:  08/03/20 157 lb 3 oz (71.3 kg)  07/27/20 154 lb 15.7 oz (70.3 kg)  07/01/20 157 lb 8 oz (71.4 kg)     There is no height or weight on file to calculate BMI.   Social History   Tobacco Use  Smoking Status Former Smoker  . Years: 30.00  . Types: Cigarettes  . Quit date: 2000  . Years since quitting: 21.7  Smokeless Tobacco Never Used      Nutrition Note  Spoke with pt. Nutrition Plan and Nutrition Survey goals reviewed with pt.   Acid reflux: controlled with medication Appetite: poor  Unintentional weight loss: Lost 25 lbs in past 15 months. He started losing weight after having radiation that caused throat pain when eating. He has gradually lost weight and stabilized about 3 weeks ago. Difficulty eating: no - feels best in the morning Protein:  Fluids: 1 gallon unsweet black tea. Encouraged decaf and increase water intake.  Meals per day: ~2 but grazes most of the time Supplements: Drinks boost occasionally but causes stomach discomfort. He does smoothies at home and feels comfortable adding protein rich foods to it to substitute for Boost.  Pt with dx of CHF. Per discussion, pt does use canned/convenience foods often. Pt does not add salt to food. Pt does eat out frequently. Pt's family members bring in   Pt expressed understanding of the information reviewed.  Nutrition Diagnosis ? Unintentional wt loss related to fatigue during food preparation and decreased food intake as evidenced by wt loss 16% in past 15 months   Nutrition Intervention ? Pt's individual nutrition plan reviewed with pt. ? Benefits of adopting healthy diet reviewed with Rate My Plate survey   ? Continue client-centered nutrition education by RD, as part of interdisciplinary care.  Goal(s)  ? Pt to identify food quantities necessary to achieve weight gain of 0.25 lbs per week  ? Pt to increase calories and protein to diet by incorporating peanut butter, avocados, whole milk, and  instant breakfasts daily  ? Homemade smoothies daily  Plan:   Will provide client-centered nutrition education as part of interdisciplinary care  Monitor and evaluate progress toward nutrition goal with team.   Michaele Offer, MS, RDN, LDN

## 2020-08-12 NOTE — Progress Notes (Signed)
Daily Session Note  Patient Details  Name: BENTON TOOKER MRN: [id number] Date of Birth: 07-26-1941 Referring Provider:     Pulmonary Rehab Walk Test from 07/27/2020 in [place]  Referring Provider [name]      Encounter Date: 08/12/2020  Check In:  Session Check In - 08/12/20 1108      Check-In   Supervising physician immediately available to respond to emergencies [facility] immediately available    Physician(s) Dr. Teryl Lucy    [facility]    Staff Present Maurice Small, RN, BSN;Lisa Ysidro Evert, RN;Ezio Wieck Hassell Done, MS, ACSM-CEP, Exercise Physiologist    Virtual Visit No    Medication changes reported     No    Fall or balance concerns reported    No    Tobacco Cessation No Change    Warm-up and Cool-down Performed on first and last piece of equipment    Resistance Training Performed Yes    VAD Patient? No    PAD/SET Patient? No      Pain Assessment   Currently in Pain? No/denies    Pain Score 0-No pain    Multiple Pain Sites No           Capillary Blood Glucose: No results found for this or any previous visit (from the past 24 hour(s)).    Social History   Tobacco Use  Smoking Status Former Smoker  . Years: 30.00  . Types: Cigarettes  . Quit date: 2000  . Years since quitting: 21.7  Smokeless Tobacco Never Used    Goals Met:  Proper associated with RPD/PD & O2 Sat Exercise tolerated well No report of cardiac concerns or symptoms Strength training completed today  Goals Unmet:  Not Applicable  Comments: Service time is from 1040 to 1147    [name] is Medical Director for Cardiac Rehab at [facility].

## 2020-08-17 ENCOUNTER — Other Ambulatory Visit: Payer: Self-pay

## 2020-08-17 ENCOUNTER — Encounter (HOSPITAL_COMMUNITY)
Admission: RE | Admit: 2020-08-17 | Discharge: 2020-08-17 | Disposition: A | Discharge status: Home / Self Care | Payer: Medicare HMO | Source: Ambulatory Visit | Attending: Pulmonary Disease | Admitting: Pulmonary Disease

## 2020-08-17 VITALS — Wt 157.0 lb

## 2020-08-17 DIAGNOSIS — J432 Centrilobular emphysema: Secondary | ICD-10-CM

## 2020-08-17 NOTE — Progress Notes (Signed)
Daily Session Note  Patient Details  Name: Robert Dixon MRN: [id number] Date of Birth: August 08, 1941 Referring Provider:     Pulmonary Rehab Walk Test from 07/27/2020 in [place]  Referring Provider [name]      Encounter Date: 08/17/2020  Check In:  Session Check In - 08/17/20 1113      Check-In   Supervising physician immediately available to respond to emergencies [facility] immediately available    Physician(s) Dr. Lonny Prude    [facility]    Staff Present Maurice Small, RN, Bjorn Loser, MS, CEP, Exercise Physiologist;Lisa Jani Gravel, MS, ACSM-CEP, Exercise Physiologist    Virtual Visit No    Medication changes reported     No    Fall or balance concerns reported    No    Tobacco Cessation No Change    Warm-up and Cool-down Performed on first and last piece of equipment    Resistance Training Performed Yes    VAD Patient? No    PAD/SET Patient? No      Pain Assessment   Currently in Pain? No/denies    Pain Score 0-No pain    Multiple Pain Sites No           Capillary Blood Glucose: No results found for this or any previous visit (from the past 24 hour(s)).   Exercise Prescription Changes - 08/17/20 1200      Response to Exercise   Blood Pressure (Admit) 98/60    Blood Pressure (Exercise) 104/56    Blood Pressure (Exit) 122/60    Heart Rate (Admit) 97 bpm    Heart Rate (Exercise) 96 bpm    Heart Rate (Exit) 93 bpm    Oxygen Saturation (Admit) 94 %    Oxygen Saturation (Exercise) 94 %    Oxygen Saturation (Exit) 98 %    Rating of Perceived Exertion (Exercise) 13    Perceived Dyspnea (Exercise) 2    Duration Progress to 30 minutes of  aerobic without signs/symptoms of physical distress    Intensity THRR unchanged      Progression   Progression Continue to progress workloads to maintain intensity without signs/symptoms of physical distress.      Resistance Training    Training Prescription Yes    Weight orange bands    Reps 10-15    Time 10 Minutes      Oxygen   Oxygen Continuous    Liters 3      NuStep   Level 1    SPM 80    Minutes 30    METs 1.8           Social History   Tobacco Use  Smoking Status Former Smoker  . Years: 30.00  . Types: Cigarettes  . Quit date: 2000  . Years since quitting: 21.7  Smokeless Tobacco Never Used    Goals Met:  Independence with exercise equipment Exercise tolerated well No report of cardiac concerns or symptoms Strength training completed today  Goals Unmet:  Not Applicable  Comments: Service time is from 1025 to 1145    [name] is Medical Director for Cardiac Rehab at [facility].

## 2020-08-19 ENCOUNTER — Other Ambulatory Visit: Payer: Self-pay

## 2020-08-19 ENCOUNTER — Encounter (HOSPITAL_COMMUNITY)
Admission: RE | Admit: 2020-08-19 | Discharge: 2020-08-19 | Disposition: A | Discharge status: Home / Self Care | Payer: Medicare HMO | Source: Ambulatory Visit | Attending: Pulmonary Disease | Admitting: Pulmonary Disease

## 2020-08-19 DIAGNOSIS — J432 Centrilobular emphysema: Secondary | ICD-10-CM | POA: Diagnosis not present

## 2020-08-19 NOTE — Progress Notes (Signed)
Daily Session Note  Patient Details  Name: Robert Dixon MRN: [id number] Date of Birth: [birth year] Referring Provider:     Pulmonary Rehab Walk Test from 07/27/2020 in [place]  Referring Provider [name]      Encounter Date: 08/19/2020  Check In:  Session Check In - 08/19/20 1146      Check-In   Supervising physician immediately available to respond to emergencies [facility] immediately available    Physician(s) [name]    [facility]    Staff Present Maurice Small, RN, BSN;Kentrel Clevenger Ysidro Evert, RN;Jessica Hassell Done, MS, ACSM-CEP, Exercise Physiologist;Annedrea Rosezella [place], RN, MHA    Virtual Visit No    Medication changes reported     No    Fall or balance concerns reported    No    Tobacco Cessation No Change    Warm-up and Cool-down Performed on first and last piece of equipment    Resistance Training Performed Yes    VAD Patient? No    PAD/SET Patient? No      Pain Assessment   Currently in Pain? No/denies    Multiple Pain Sites No           Capillary Blood Glucose: No results found for this or any previous visit (from the past 24 hour(s)).    Social History   Tobacco Use  Smoking Status Former Smoker  . Years: 30.00  . Types: Cigarettes  . Quit date: 2000  . Years since quitting: 21.7  Smokeless Tobacco Never Used    Goals Met:  Exercise tolerated well No report of cardiac concerns or symptoms Strength training completed today  Goals Unmet:  Not Applicable  Comments: Service time is from 1035 to 1155    [name] is Medical Director for Cardiac Rehab at [facility].

## 2020-08-24 ENCOUNTER — Encounter (HOSPITAL_COMMUNITY): Payer: Medicare HMO

## 2020-08-24 ENCOUNTER — Telehealth (HOSPITAL_COMMUNITY): Payer: Self-pay | Admitting: Family Medicine

## 2020-08-25 ENCOUNTER — Other Ambulatory Visit (INDEPENDENT_AMBULATORY_CARE_PROVIDER_SITE_OTHER): Payer: Medicare HMO

## 2020-08-25 ENCOUNTER — Ambulatory Visit (INDEPENDENT_AMBULATORY_CARE_PROVIDER_SITE_OTHER): Payer: Medicare HMO | Admitting: Gastroenterology

## 2020-08-25 ENCOUNTER — Encounter: Payer: Self-pay | Admitting: Gastroenterology

## 2020-08-25 VITALS — BP 110/60 | HR 100 | Ht 68.0 in | Wt 158.2 lb

## 2020-08-25 DIAGNOSIS — R630 Anorexia: Secondary | ICD-10-CM

## 2020-08-25 DIAGNOSIS — R194 Change in bowel habit: Secondary | ICD-10-CM

## 2020-08-25 DIAGNOSIS — R1084 Generalized abdominal pain: Secondary | ICD-10-CM

## 2020-08-25 LAB — H. PYLORI ANTIBODY, IGG: H Pylori IgG: NEGATIVE

## 2020-08-25 NOTE — Progress Notes (Signed)
[facility] Consult Note:  History: Robert Dixon 08/25/2020  Referring provider: [name], MD  Reason for consult/chief complaint: loss of appitite (Patient states he does not feellike eating and his stomach feels "blah"), Abdominal Pain (Lower abdominal discomfort ), and Diarrhea (occasional diahrrhea that goes to constipation after )   Subjective  HPI:  This is a very pleasant 79 year old man accompanied by his son who also had his wife (patient's daughter-in-law) on the phone for the visit. Robert Dixon says that his stomach just has not felt well since he underwent cancer treatment in early 2020, and it has behaved in a similar fashion since then.  Nearly every day his stomach feels "blah", which she really cannot characterize any further.  It seems to be a generalized abdominal feeling of being unwell, perhaps a dull discomfort and more in the mid abdomen.  However, he denies nausea, vomiting, dysphagia or odynophagia.  He lost about 30 pounds during cancer treatment but stable since then.  His son reports that he has a good appetite despite these feelings, and sometimes food does not appeal to him. He has occasional loose stool or constipation and no rectal bleeding.   ROS:  Review of Systems  Constitutional: Positive for fatigue. Negative for appetite change and unexpected weight change.  HENT: Negative for mouth sores and voice change.   Eyes: Negative for pain and redness.  Respiratory: Positive for shortness of breath. Negative for cough.   Cardiovascular: Positive for leg swelling. Negative for chest pain and palpitations.  Genitourinary: Negative for dysuria and hematuria.  Musculoskeletal: Negative for arthralgias and myalgias.  Skin: Negative for pallor and rash.  Neurological: Negative for weakness and headaches.  Hematological: Negative for adenopathy.      From 07/01/20 Oncology office note: "Bettey Mare 79 y.o. male returns to the clinic  today for follow-up visit accompanied by his daughter-in-law.  The patient is feeling fine today with no concerning complaints set for the baseline shortness of breath and he is currently on home oxygen.  He denied having any current chest pain, cough or hemoptysis.  He denied having any fever or chills.  He has no nausea, vomiting, diarrhea or constipation.  He has no headache or visual changes.  He is here today for evaluation with repeat CT scan of the chest for restaging of his disease. "   Past Medical History: Past Medical History:  Diagnosis Date  . Acute on chronic respiratory failure with hypoxia ([place]) 03/16/2020  . Acute respiratory failure ([place]) 04/09/2013  . Carotid artery disease ([place]) 03/28/2020   Carotid US 02/2020:  R 100%; L 1-39%  . Chronic systolic CHF 54/65/0354   Echo 02/2020: EF 30-35, small pericardial effusion, trivial MR, status post TAVR with no PVL, mean gradient 7 mmHg  . Complication of anesthesia   . COPD (chronic obstructive pulmonary disease) ([place])   . GERD (gastroesophageal reflux disease)   . Hemoptysis 11/20/2018  . History of hiatal hernia   . Hypertension   . 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 Mediastinal adenopathy 12/06/2018  . Migraine with visual aura   . Non-small cell carcinoma of left lung, stage 3 ([place]) 12/19/2018  . Non-small cell lung cancer ([place]) dx'd 11/20/18  . Pericardial effusion 10/14/2019  . Pneumonia 11/20/2018  . PONV (postoperative nausea and vomiting)   . Primary malignant neoplasm of left upper lobe of lung ([place]) 12/12/2018  . Pulmonary nodule 11/20/2018  . 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  Severe aortic stenosis 10/14/2019  . Skin cancer      Past Surgical History: Past Surgical History:  Procedure Laterality Date  . COLONOSCOPY W/ POLYPECTOMY    . IR IMAGING GUIDED PORT INSERTION  03/14/2019  . IR THORACENTESIS ASP PLEURAL SPACE W/IMG GUIDE  03/26/2020  . RIGHT HEART CATH AND CORONARY ANGIOGRAPHY  N/A 03/19/2020   Procedure: RIGHT HEART CATH AND CORONARY ANGIOGRAPHY;  Surgeon: [name], MD;  Location: [facility];  Service: Cardiovascular;  Laterality: N/A;  . TEE WITHOUT CARDIOVERSION N/A 03/25/2020   Procedure: TRANSESOPHAGEAL ECHOCARDIOGRAM (TEE);  Surgeon: [name], MD;  Location: [facility];  Service: Open Heart Surgery;  Laterality: N/A;  . TONSILLECTOMY    . TRANSCATHETER AORTIC VALVE REPLACEMENT, TRANSFEMORAL Left 03/25/2020   Procedure: TRANSCATHETER AORTIC VALVE REPLACEMENT, LEFT TRANSFEMORAL;  Surgeon: [name], MD;  Location: [facility];  Service: Open Heart Surgery;  Laterality: Left;  Marland Kitchen VASECTOMY    . VIDEO BRONCHOSCOPY WITH ENDOBRONCHIAL ULTRASOUND N/A 12/06/2018   Procedure: VIDEO BRONCHOSCOPY WITH ENDOBRONCHIAL ULTRASOUND;  Surgeon: Garner Nash, DO;  Location: [facility];  Service: Thoracic;  Laterality: N/A;     Family History: Family History  Problem Relation Age of Onset  . Cancer - Lung Mother   . Hypertension Father   . CVA Father   . Cancer - Cervical Sister     Social History: Social History   Socioeconomic History  . Marital status: Widowed    Spouse name: Not on file  . Number of children: Not on file  . Years of education: Not on file  . Highest education level: Not on file  Occupational History  . Not on file  Tobacco Use  . Smoking status: Former Smoker    Years: 30.00    Types: Cigarettes    Quit date: 2000    Years since quitting: 21.7  . Smokeless tobacco: Never Used  Vaping Use  . Vaping Use: Never used  Substance and Sexual Activity  . Alcohol use: No  . Drug use: No  . Sexual activity: Not on file  Other Topics Concern  . Not on file  Social History Narrative   Lives alone.  Two sons.  Widower.     Social Determinants of Health   Financial Resource Strain:   . Difficulty of Paying Living Expenses: Not on file  Food Insecurity:   . Worried About Charity fundraiser in the Last  Year: Not on file  . Ran Out of Food in the Last Year: Not on file  Transportation Needs:   . Lack of Transportation (Medical): Not on file  . Lack of Transportation (Non-Medical): Not on file  Physical Activity:   . Days of Exercise per Week: Not on file  . Minutes of Exercise per Session: Not on file  Stress:   . Feeling of Stress : Not on file  Social Connections:   . Frequency of Communication with Friends and Family: Not on file  . Frequency of Social Gatherings with Friends and Family: Not on file  . Attends Religious Services: Not on file  . Active Member of Clubs or Organizations: Not on file  . Attends Archivist Meetings: Not on file  . Marital Status: Not on file    Allergies: No Known Allergies  Outpatient Meds: Current Outpatient Medications  Medication Sig Dispense Refill  . acetaminophen (TYLENOL) 325 MG tablet Take 2 tablets (650 mg total) by mouth every 6 (six)  hours as needed for mild pain (or Fever >/= 101).    Marland Kitchen albuterol (PROVENTIL) (2.5 MG/3ML) 0.083% nebulizer solution Take 3 mLs (2.5 mg total) by nebulization every 4 (four) hours. 1080 mL 5  . albuterol (VENTOLIN HFA) 108 (90 Base) MCG/ACT inhaler Inhale 2 puffs into the lungs every 6 (six) hours as needed for wheezing or shortness of breath. 18 g 5  . alum & mag hydroxide-simeth (MAALOX/MYLANTA) 200-200-20 MG/5ML suspension Take 15 mLs by mouth every 6 (six) hours as needed for indigestion or heartburn. 355 mL 0  . ascorbic acid (VITAMIN C) 500 MG tablet Take 500 mg by mouth daily.    Marland Kitchen aspirin 81 MG chewable tablet Chew 1 tablet (81 mg total) by mouth daily.    Marland Kitchen atorvastatin (LIPITOR) 40 MG tablet Take 1 tablet (40 mg total) by mouth daily. 90 tablet 3  . citalopram (CELEXA) 10 MG tablet Take 10 mg by mouth daily.    . clopidogrel (PLAVIX) 75 MG tablet Take 1 tablet (75 mg total) by mouth daily with breakfast. 90 tablet 3  . Cyanocobalamin (VITAMIN B 12 PO) Take 1 capsule by mouth daily. 2500 mcg     . Fluticasone-Umeclidin-Vilant (TRELEGY ELLIPTA) 100-62.5-25 MCG/INH AEPB Inhale 1 puff into the lungs daily. 60 each 0  . furosemide (LASIX) 20 MG tablet Take 1 tablet (20 mg total) by mouth daily. 90 tablet 3  . metoprolol succinate (TOPROL-XL) 25 MG 24 hr tablet Take 0.5 tablets (12.5 mg total) by mouth daily. 45 tablet 3  . Multiple Vitamin (MULTIVITAMIN) tablet Take 1 tablet by mouth daily.    . pantoprazole (PROTONIX) 40 MG tablet Take 40 mg by mouth 2 (two) times daily.    . polycarbophil (FIBERCON) 625 MG tablet Take 625 mg by mouth 2 (two) times daily.     . tamsulosin (FLOMAX) 0.4 MG CAPS capsule Take 0.4 mg by mouth daily.     . TRELEGY ELLIPTA 100-62.5-25 MCG/INH AEPB Inhale 1 puff into the lungs daily. 180 each 1   No current facility-administered medications for this visit.   Facility-Administered Medications Ordered in Other Visits  Medication Dose Route Frequency Provider Last Rate Last Admin  . sodium chloride flush (NS) 0.9 % injection 10 mL  10 mL Intracatheter PRN [name], MD      . sodium chloride flush (NS) 0.9 % injection 10 mL  10 mL Intracatheter PRN [name], MD   10 mL at 05/20/19 1232      ___________________________________________________________________ Objective   Exam:  BP 110/60   Pulse 100   Ht 5\' 8"  (1.727 m)   Wt 158 lb 4 oz (71.8 kg)   BMI 24.06 kg/m  Respiratory rate 20, breathing at top of lung volumes  General: Chronically ill elderly man, dyspneic at rest wearing supplemental oxygen.  Eyes: sclera anicteric, no redness  ENT: oral mucosa moist without lesions, no cervical or supraclavicular lymphadenopathy  CV: RRR without murmur, S1/S2, no JVD, no peripheral edema  Resp: Globally decreased breath sounds, hyperinflation  GI: soft, no tenderness, with active bowel sounds. No guarding or palpable organomegaly noted.  Skin; warm and dry, no rash or jaundice noted  Neuro: awake, alert and oriented x 3. Normal  gross motor function and fluent speech  Labs:  CBC Latest Ref Rng & Units 06/28/2020 04/08/2020 03/28/2020  WBC 4.0 - 10.5 K/uL 5.5 5.6 8.1  Hemoglobin 13.0 - 17.0 g/dL 10.0(L) 9.4(L) 9.0(L)  Hematocrit 39 - 52 % 30.7(L) 28.7(L) 27.4(L)  Platelets 150 - 400 K/uL 196 297 134(L)   CMP Latest Ref Rng & Units 06/28/2020 04/08/2020 03/28/2020  Glucose 70 - 99 mg/dL 84 105(H) 96  BUN 8 - 23 mg/dL 15 24(H) 25(H)  Creatinine 0.61 - 1.24 mg/dL 1.03 1.17 1.28(H)  Sodium 135 - 145 mmol/L 141 140 139  Potassium 3.5 - 5.1 mmol/L 4.2 4.4 4.0  Chloride 98 - 111 mmol/L 110 107 105  CO2 22 - 32 mmol/L 24 24 27   Calcium 8.9 - 10.3 mg/dL 9.3 8.5(L) 8.0(L)  Total Protein 6.5 - 8.1 g/dL 6.2(L) 5.9(L) -  Total Bilirubin 0.3 - 1.2 mg/dL 0.7 0.5 -  Alkaline Phos 38 - 126 U/L 106 91 -  AST 15 - 41 U/L 22 15 -  ALT 0 - 44 U/L 25 17 -     Radiologic Studies:  03/22/20 CTA abdomen report on file - notes severe diverticulosis, no other incidental GI findings  Assessment: Encounter Diagnoses  Name Primary?  . Generalized abdominal pain Yes  . Loss of appetite   . Altered bowel habits     Chronic vague digestive symptoms ever since cancer treatment with chemotherapy and radiation and subsequent TAVR.  He has underlying severe COPD. This is difficult to characterize, fortunately no red flag symptoms such as rectal bleeding or ongoing weight loss.  His daughter-in-law asked about H. pylori testing.  Robert Dixon would be at high risk for complications of endoscopic procedures, particular related to the sedation in the setting of his lung disease.  I hope to avoid that if possible. Plan:  CT scan abdomen and pelvis with oral and IV contrast.  If that highly suggests an abnormality requiring endoscopic evaluation, then that will be reconsidered.  If not, then this will reassure him and his family and this may be lasting effects from the physiologic toll of his cancer treatment.  Serum H. pylori antibody, treat if  positive  Thank you for the courtesy of this consult.  Please call me with any questions or concerns.  Nelida Meuse III  CC: Referring provider noted above

## 2020-08-25 NOTE — Patient Instructions (Addendum)
If you are age 79 or older, your body mass index should be between 23-30. Your Body mass index is 24.06 kg/m. If this is out of the aforementioned range listed, please consider follow up with your Primary Care Provider.  If you are age 58 or younger, your body mass index should be between 19-25. Your Body mass index is 24.06 kg/m. If this is out of the aformentioned range listed, please consider follow up with your Primary Care Provider.   You have been scheduled for a CT scan of the abdomen and pelvis at [facility], 1st floor Radiology. You are scheduled on 09-03-2020  at 830am. You should arrive 15 minutes prior to your appointment time for registration.  Please pick up 2 bottles of contrast from [facility] at least 3 days prior to your scan. The solution may taste better if refrigerated, but do NOT add ice or any other liquid to this solution. Shake well before drinking.   Please follow the written instructions below on the day of your exam:   1) Do not eat anything after 430am (4 hours prior to your test)   2) Drink 1 bottle of contrast @ 630am (2 hours prior to your exam)  Remember to shake well before drinking and do NOT pour over ice.     Drink 1 bottle of contrast @ 730am (1 hour prior to your exam)   You may take any medications as prescribed with a small amount of water, if necessary. If you take any of the following medications: METFORMIN, GLUCOPHAGE, GLUCOVANCE, AVANDAMET, RIOMET, FORTAMET, [place] MET, JANUMET, GLUMETZA or METAGLIP, you MAY be asked to HOLD this medication 48 hours AFTER the exam.   The purpose of you drinking the oral contrast is to aid in the visualization of your intestinal tract. The contrast solution may cause some diarrhea. Depending on your individual set of symptoms, you may also receive an intravenous injection of x-ray contrast/dye. Plan on being at [facility] for 45 minutes or longer, depending on the type of exam you are having performed.   If  you have any questions regarding your exam or if you need to reschedule, you may call [facility] at [phone] between the hours of 8:00 am and 5:00 pm, Monday-Friday.    ________________________________________________________________________  Your provider has requested that you go to the basement level for lab work before leaving today. Press "B" on the elevator. The lab is located at the first door on the left as you exit the elevator.  Due to recent changes in healthcare laws, you may see the results of your imaging and laboratory studies on MyChart before your provider has had a chance to review them.  We understand that in some cases there may be results that are confusing or concerning to you. Not all laboratory results come back in the same time frame and the provider may be waiting for multiple results in order to interpret others.  Please give [place] 48 hours in order for your provider to thoroughly review all the results before contacting the office for clarification of your results.   It was a pleasure to see you today!  Dr. Loletha Carrow

## 2020-08-26 ENCOUNTER — Other Ambulatory Visit: Payer: Self-pay

## 2020-08-26 ENCOUNTER — Encounter (HOSPITAL_COMMUNITY)
Admission: RE | Admit: 2020-08-26 | Discharge: 2020-08-26 | Disposition: A | Discharge status: Home / Self Care | Payer: Medicare HMO | Source: Ambulatory Visit | Attending: Pulmonary Disease | Admitting: Pulmonary Disease

## 2020-08-26 DIAGNOSIS — J432 Centrilobular emphysema: Secondary | ICD-10-CM | POA: Diagnosis not present

## 2020-08-26 LAB — BUN/CREATININE RATIO
BUN/Creatinine Ratio: 18 (calc) (ref 6–22)
BUN: 22 mg/dL (ref 7–25)
Creat: 1.2 mg/dL — ABNORMAL HIGH (ref 0.70–1.18)
GFR, Est African American: 66 mL/min/{1.73_m2} (ref >=60)
GFR, Est Non African American: 57 mL/min/{1.73_m2} — ABNORMAL LOW (ref >=60)

## 2020-08-26 NOTE — Progress Notes (Signed)
Daily Session Note  Patient Details  Name: Robert Dixon MRN: [id number] Date of Birth: 01-Aug-1941 Referring Provider:     Pulmonary Rehab Walk Test from 07/27/2020 in [place]  Referring Provider [name]      Encounter Date: 08/26/2020  Check In:  Session Check In - 08/26/20 1044      Check-In   Supervising physician immediately available to respond to emergencies [facility] immediately available    Physician(s) Dr. Doristine Bosworth    Location [facility]    Staff Present Maurice Small, RN, Isaac Laud, MS, ACSM-CEP, Exercise Physiologist;Rondal Vandevelde Kris Mouton, MS, CEP, Exercise Physiologist    Virtual Visit No    Medication changes reported     No    Fall or balance concerns reported    No    Tobacco Cessation No Change    Warm-up and Cool-down Performed on first and last piece of equipment    Resistance Training Performed Yes    VAD Patient? No    PAD/SET Patient? No      Pain Assessment   Currently in Pain? No/denies    Pain Score 0-No pain    Multiple Pain Sites No           Capillary Blood Glucose: No results found for this or any previous visit (from the past 24 hour(s)).    Social History   Tobacco Use  Smoking Status Former Smoker  . Years: 30.00  . Types: Cigarettes  . Quit date: 2000  . Years since quitting: 21.7  Smokeless Tobacco Never Used    Goals Met:  Independence with exercise equipment Exercise tolerated well No report of cardiac concerns or symptoms Strength training completed today  Goals Unmet:  Not Applicable  Comments: Service time is from 1040 to 1142    [name] is Medical Director for Cardiac Rehab at [facility]

## 2020-08-26 NOTE — Progress Notes (Signed)
Pulmonary Individual Treatment Plan  Patient Details  Name: Robert Dixon MRN: [id number] Date of Birth: [birth year] Referring Provider:     Pulmonary Rehab Walk Test from 07/27/2020 in [place]  Referring Provider 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  acetaminophen (TYLENOL) 325 MG tablet, Take 2 tablets (650 mg total) by mouth every 6 (six) hours as needed for mild pain (or Fever >/= 101)., Disp: , Rfl:  .  albuterol (PROVENTIL) (2.5 MG/3ML) 0.083% nebulizer solution, Take 3 mLs (2.5 mg total) by nebulization every 4 (four) hours., Disp: 1080 mL, Rfl: 5 .  albuterol (VENTOLIN HFA) 108 (90 Base) MCG/ACT inhaler, Inhale 2 puffs into the lungs every 6 (six) hours as needed for wheezing or shortness of breath., Disp: 18 g, Rfl: 5 .  alum & mag hydroxide-simeth (MAALOX/MYLANTA) 200-200-20 MG/5ML suspension, Take 15 mLs by mouth every 6 (six) hours as needed for indigestion or heartburn., Disp: 355 mL, Rfl: 0 .  ascorbic acid (VITAMIN C) 500 MG tablet, Take 500 mg by mouth daily., Disp: , Rfl:  .  aspirin 81 MG chewable tablet, Chew 1 tablet (81 mg total) by mouth daily., Disp: , Rfl:  .  atorvastatin (LIPITOR) 40 MG tablet, Take 1 tablet (40 mg total) by mouth daily., Disp: 90 tablet, Rfl: 3 .  citalopram (CELEXA) 10 MG tablet, Take 10 mg by mouth daily., Disp: , Rfl:  .  clopidogrel (PLAVIX) 75 MG tablet, Take 1 tablet (75 mg total) by mouth daily with breakfast., Disp: 90 tablet, Rfl: 3 .  Cyanocobalamin (VITAMIN B 12 PO), Take 1 capsule by mouth daily. 2500 mcg, Disp: , Rfl:  .  Fluticasone-Umeclidin-Vilant (TRELEGY ELLIPTA) 100-62.5-25 MCG/INH AEPB, Inhale 1 puff into the lungs daily., Disp: 60 each, Rfl: 0 .   furosemide (LASIX) 20 MG tablet, Take 1 tablet (20 mg total) by mouth daily., Disp: 90 tablet, Rfl: 3 .  metoprolol succinate (TOPROL-XL) 25 MG 24 hr tablet, Take 0.5 tablets (12.5 mg total) by mouth daily., Disp: 45 tablet, Rfl: 3 .  Multiple Vitamin (MULTIVITAMIN) tablet, Take 1 tablet by mouth daily., Disp: , Rfl:  .  pantoprazole (PROTONIX) 40 MG tablet, Take 40 mg by mouth 2 (two) times daily., Disp: , Rfl:  .  polycarbophil (FIBERCON) 625 MG tablet, Take 625 mg by mouth 2 (two) times daily. , Disp: , Rfl:  .  tamsulosin (FLOMAX) 0.4 MG CAPS capsule, Take 0.4 mg by mouth daily. , Disp: , Rfl:  .  TRELEGY ELLIPTA 100-62.5-25 MCG/INH AEPB, Inhale 1 puff into the lungs daily., Disp: 180 each, Rfl: 1 No current facility-administered medications for this encounter.  Facility-Administered Medications Ordered in Other Encounters:  .  sodium chloride flush (NS) 0.9 % injection 10 mL, 10 mL, Intracatheter, PRN, [name], MD .  sodium chloride flush (NS) 0.9 % injection 10 mL, 10 mL, Intracatheter, PRN, [name], MD, 10 mL at 05/20/19 1232  Past Medical History: Past Medical History:  Diagnosis Date  . Acute on chronic respiratory failure with hypoxia ([place]) 03/16/2020  . Acute respiratory failure ([name]) 04/09/2013  . Carotid artery disease ([place]) 03/28/2020   Carotid US 02/2020:  R 100%; L 1-39%  . Chronic systolic CHF 62/70/3500  Echo 02/2020: EF 30-35, small pericardial effusion, trivial MR, status post TAVR with no PVL, mean gradient 7 mmHg  . Complication of anesthesia   . COPD (chronic obstructive pulmonary disease) ([place])   . GERD (gastroesophageal reflux disease)   . Hemoptysis 11/20/2018  . History of hiatal hernia   . Hypertension   . 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 Mediastinal adenopathy 12/06/2018  . Migraine with visual aura   . 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 Non-small cell lung cancer ([place]) dx'd 11/20/18  . Pericardial effusion  10/14/2019  . Pneumonia 11/20/2018  . PONV (postoperative nausea and vomiting)   . Primary malignant neoplasm of left upper lobe of lung ([place]) 12/12/2018  . Pulmonary nodule 11/20/2018  . 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 Severe aortic stenosis 10/14/2019  . Skin cancer     Tobacco Use: Social History   Tobacco Use  Smoking Status Former Smoker  . Years: 30.00  . Types: Cigarettes  . Quit date: 2000  . Years since quitting: 21.7  Smokeless Tobacco Never Used    Labs: Recent Chemical engineer    Labs for ITP Cardiac and Pulmonary Rehab Latest Ref Rng & Units 03/19/2020 03/25/2020 03/25/2020 03/25/2020 03/25/2020   Cholestrol 0 - 200 mg/dL - - - - -   LDLCALC 0 - 99 mg/dL - - - - -   HDL >40 mg/dL - - - - -   Trlycerides <150 mg/dL - - - - -   Hemoglobin A1c 4.8 - 5.6 % - - - - -   PHART 7.35 - 7.45 - 7.474(H) - - -   PCO2ART 32 - 48 mmHg - 44.1 - - -   HCO3 20.0 - 28.0 mmol/L 28.4(H) 32.3(H) - - -   TCO2 22 - 32 mmol/L 30 34(H) 29 32 31   ACIDBASEDEF 0.0 - 2.0 mmol/L - - - - -   O2SAT % 72.0 100.0 - - -      Capillary Blood Glucose: Lab Results  Component Value Date   GLUCAP 87 12/11/2018     Pulmonary Assessment Scores:  Pulmonary Assessment Scores    Row Name 07/27/20 1623         ADL UCSD   ADL Phase Entry       mMRC Score   mMRC Score 3           UCSD: Self-administered rating of dyspnea associated with activities of daily living (ADLs) 6-point scale (0 = "not at all" to 5 = "maximal or unable to do because of breathlessness")  Scoring Scores range from 0 to 120.  Minimally important difference is 5 units  CAT: CAT can identify the health impairment of COPD patients and is better correlated with disease progression.  CAT has a scoring range of zero to 40. The CAT score is classified into four groups of low (less than 10), medium (10 - 20), high (21-30) and very high (31-40) based on the impact level of disease on  health status. A CAT score over 10 suggests significant symptoms.  A worsening CAT score could be explained by an exacerbation, poor medication adherence, poor inhaler technique, or progression of COPD or comorbid conditions.  CAT MCID is 2 points  mMRC: mMRC (Modified Medical Research Council) Dyspnea Scale is used to assess the degree of baseline functional disability in patients of respiratory disease due to dyspnea. No minimal important difference is established. A decrease in  score of 1 point or greater is considered a positive change.   Pulmonary Function Assessment:   Exercise Target Goals: Exercise Program Goal: Individual exercise prescription set using results from initial 6 min walk test and THRR while considering  patient's activity barriers and safety.   Exercise Prescription Goal: Initial exercise prescription builds to 30-45 minutes a day of aerobic activity, 2-3 days per week.  Home exercise guidelines will be given to patient during program as part of exercise prescription that the participant will acknowledge.  Activity Barriers & Risk Stratification:  Activity Barriers & Cardiac Risk Stratification - 07/27/20 1447      Activity Barriers & Cardiac Risk Stratification   Activity Barriers History of Falls;Assistive Device           6 Minute Walk:  6 Minute Walk    Row Name 07/27/20 1628         6 Minute Walk   Phase Initial     Distance 434 feet     Walk Time 6 minutes     # of Rest Breaks 0     MPH 0.82     METS 1.03     RPE 12     Perceived Dyspnea  2     VO2 Peak 3.61     Symptoms Yes (comment)     Comments Sat for the final 2 min 20 sec of test due to SOB     Resting HR 95 bpm     Resting BP 110/60     Resting Oxygen Saturation  100 %     Exercise Oxygen Saturation  during 6 min walk 90 %     Max Ex. HR 105 bpm     Max Ex. BP 126/70     2 Minute Post BP 104/62       Interval HR   1 Minute HR 102     2 Minute HR 104     3 Minute HR 105     4  Minute HR 99     5 Minute HR 93     6 Minute HR 91     2 Minute Post HR 91     Interval Heart Rate? Yes       Interval Oxygen   Interval Oxygen? Yes     Baseline Oxygen Saturation % 100 %     1 Minute Oxygen Saturation % 95 %     1 Minute Liters of Oxygen 3 L     2 Minute Oxygen Saturation % 92 %     2 Minute Liters of Oxygen 3 L     3 Minute Oxygen Saturation % 91 %     3 Minute Liters of Oxygen 3 L     4 Minute Oxygen Saturation % 90 %     4 Minute Liters of Oxygen 3 L     5 Minute Oxygen Saturation % 95 %     5 Minute Liters of Oxygen 3 L     6 Minute Oxygen Saturation % 99 %     6 Minute Liters of Oxygen 3 L     2 Minute Post Oxygen Saturation % 99 %     2 Minute Post Liters of Oxygen 3 L            Oxygen Initial Assessment:  Oxygen Initial Assessment - 07/27/20 1623      Initial 6 min Walk   Oxygen Used Continuous    Liters per  minute 3      Program Oxygen Prescription   Program Oxygen Prescription Continuous    Liters per minute 3      Intervention   Short Term Goals To learn and exhibit compliance with exercise, home and travel O2 prescription;To learn and understand importance of monitoring SPO2 with pulse oximeter and demonstrate accurate use of the pulse oximeter.;To learn and understand importance of maintaining oxygen saturations>88%;To learn and demonstrate proper pursed lip breathing techniques or other breathing techniques.;To learn and demonstrate proper use of respiratory medications    Long  Term Goals Exhibits compliance with exercise, home and travel O2 prescription;Verbalizes importance of monitoring SPO2 with pulse oximeter and return demonstration;Maintenance of O2 saturations>88%;Exhibits proper breathing techniques, such as pursed lip breathing or other method taught during program session;Compliance with respiratory medication;Demonstrates proper use of MDI's           Oxygen Re-Evaluation:  Oxygen Re-Evaluation    Row Name 08/05/20 1545  08/24/20 0753           Program Oxygen Prescription   Program Oxygen Prescription Continuous Continuous      Liters per minute 3 3        Home Oxygen   Home Oxygen Device Portable Concentrator;Home Concentrator Portable Concentrator;Home Concentrator      Sleep Oxygen Prescription Continuous Continuous      Liters per minute 3 3      Home Exercise Oxygen Prescription Continuous Pulsed      Liters per minute 3 3      Home Resting Oxygen Prescription Continuous Pulsed      Liters per minute 3 3      Compliance with Home Oxygen Use Yes Yes        Goals/Expected Outcomes   Short Term Goals To learn and exhibit compliance with exercise, home and travel O2 prescription;To learn and understand importance of monitoring SPO2 with pulse oximeter and demonstrate accurate use of the pulse oximeter.;To learn and understand importance of maintaining oxygen saturations>88%;To learn and demonstrate proper pursed lip breathing techniques or other breathing techniques.;To learn and demonstrate proper use of respiratory medications To learn and exhibit compliance with exercise, home and travel O2 prescription;To learn and understand importance of monitoring SPO2 with pulse oximeter and demonstrate accurate use of the pulse oximeter.;To learn and understand importance of maintaining oxygen saturations>88%;To learn and demonstrate proper pursed lip breathing techniques or other breathing techniques.;To learn and demonstrate proper use of respiratory medications      Long  Term Goals Exhibits compliance with exercise, home and travel O2 prescription;Verbalizes importance of monitoring SPO2 with pulse oximeter and return demonstration;Maintenance of O2 saturations>88%;Exhibits proper breathing techniques, such as pursed lip breathing or other method taught during program session;Compliance with respiratory medication;Demonstrates proper use of MDI's Exhibits compliance with exercise, home and travel O2  prescription;Verbalizes importance of monitoring SPO2 with pulse oximeter and return demonstration;Maintenance of O2 saturations>88%;Exhibits proper breathing techniques, such as pursed lip breathing or other method taught during program session;Compliance with respiratory medication;Demonstrates proper use of MDI's      Comments -- pt has not had any problems maintaing his sats while exercising.      Goals/Expected Outcomes compliance compliance and understanding of oxygen saturation and pursed lip breathing             Oxygen Discharge (Final Oxygen Re-Evaluation):  Oxygen Re-Evaluation - 08/24/20 0753      Program Oxygen Prescription   Program Oxygen Prescription Continuous    Liters per minute 3  Home Oxygen   Home Oxygen Device Portable Concentrator;Home Concentrator    Sleep Oxygen Prescription Continuous    Liters per minute 3    Home Exercise Oxygen Prescription Pulsed    Liters per minute 3    Home Resting Oxygen Prescription Pulsed    Liters per minute 3    Compliance with Home Oxygen Use Yes      Goals/Expected Outcomes   Short Term Goals To learn and exhibit compliance with exercise, home and travel O2 prescription;To learn and understand importance of monitoring SPO2 with pulse oximeter and demonstrate accurate use of the pulse oximeter.;To learn and understand importance of maintaining oxygen saturations>88%;To learn and demonstrate proper pursed lip breathing techniques or other breathing techniques.;To learn and demonstrate proper use of respiratory medications    Long  Term Goals Exhibits compliance with exercise, home and travel O2 prescription;Verbalizes importance of monitoring SPO2 with pulse oximeter and return demonstration;Maintenance of O2 saturations>88%;Exhibits proper breathing techniques, such as pursed lip breathing or other method taught during program session;Compliance with respiratory medication;Demonstrates proper use of MDI's    Comments pt has  not had any problems maintaing his sats while exercising.    Goals/Expected Outcomes compliance and understanding of oxygen saturation and pursed lip breathing           Initial Exercise Prescription:  Initial Exercise Prescription - 07/27/20 1600      Date of Initial Exercise RX and Referring Provider   Date 07/27/20    Referring Provider [name]      Oxygen   Oxygen Continuous    Liters 3      NuStep   Level 1    SPM 80    Minutes 30      Prescription Details   Frequency (times per week) 2    Duration Progress to 30 minutes of continuous aerobic without signs/symptoms of physical distress      Intensity   THRR 40-80% of Max Heartrate 56-113    Ratings of Perceived Exertion 11-13    Perceived Dyspnea 0-4      Progression   Progression Continue progressive overload as per policy without signs/symptoms or physical distress.      Resistance Training   Training Prescription Yes    Weight orange band    Reps 10-15           Perform Capillary Blood Glucose checks as needed.  Exercise Prescription Changes:  Exercise Prescription Changes    Row Name 08/03/20 1200 08/17/20 1200 08/19/20 1603         Response to Exercise   Blood Pressure (Admit) 110/56 98/60 102/60     Blood Pressure (Exercise) 114/70 104/56 --     Blood Pressure (Exit) 108/60 122/60 110/70     Heart Rate (Admit) 97 bpm 97 bpm 92 bpm     Heart Rate (Exercise) 92 bpm 96 bpm 99 bpm     Heart Rate (Exit) 91 bpm 93 bpm 96 bpm     Oxygen Saturation (Admit) 92 % 94 % 97 %     Oxygen Saturation (Exercise) 97 % 94 % 91 %     Oxygen Saturation (Exit) 91 % 98 % 95 %     Rating of Perceived Exertion (Exercise) _0 Perceived Dyspnea (Exercise) _1 Duration Progress to 30 minutes of  aerobic without signs/symptoms of physical distress Progress to 30 minutes of  aerobic without signs/symptoms of physical distress Continue  with 30 min of aerobic exercise without signs/symptoms of physical  distress.     Intensity Other (comment) THRR unchanged THRR unchanged       Progression   Progression Continue to progress workloads to maintain intensity without signs/symptoms of physical distress.  40-80% of HRR Continue to progress workloads to maintain intensity without signs/symptoms of physical distress. Continue to progress workloads to maintain intensity without signs/symptoms of physical distress.     Average METs -- -- 1.8       Resistance Training   Training Prescription Yes Yes Yes     Weight orange bands orange bands orange bands     Reps 10-15 10-15 10-15     Time 10 Minutes 10 Minutes 10 Minutes       Oxygen   Oxygen Continuous Continuous Continuous     Liters _0 NuStep   Level _1 SPM 80 80 80     Minutes _2 METs 1.4 1.8 1.8            Exercise Comments:  Exercise Comments    Row Name 08/03/20 1213           Exercise Comments pt completed first day of exercise with pulmonary rehab with no problems or concerns. He tolerated exercise well and has a positive outlook              Exercise Goals and Review:  Exercise Goals    Row Name 07/27/20 1637             Exercise Goals   Increase Physical Activity Yes       Intervention Provide advice, education, support and counseling about physical activity/exercise needs.;Develop an individualized exercise prescription for aerobic and resistive training based on initial evaluation findings, risk stratification, comorbidities and participant's personal goals.       Expected Outcomes Short Term: Attend rehab on a regular basis to increase amount of physical activity.;Long Term: Add in home exercise to make exercise part of routine and to increase amount of physical activity.;Long Term: Exercising regularly at least 3-5 days a week.       Increase Strength and Stamina Yes       Intervention Provide advice, education, support and counseling about physical activity/exercise needs.;Develop  an individualized exercise prescription for aerobic and resistive training based on initial evaluation findings, risk stratification, comorbidities and participant's personal goals.       Expected Outcomes Short Term: Increase workloads from initial exercise prescription for resistance, speed, and METs.;Short Term: Perform resistance training exercises routinely during rehab and add in resistance training at home;Long Term: Improve cardiorespiratory fitness, muscular endurance and strength as measured by increased METs and functional capacity (6MWT)       Able to understand and use rate of perceived exertion (RPE) scale Yes       Intervention Provide education and explanation on how to use RPE scale       Expected Outcomes Short Term: Able to use RPE daily in rehab to express subjective intensity level;Long Term:  Able to use RPE to guide intensity level when exercising independently       Able to understand and use Dyspnea scale Yes       Intervention Provide education and explanation on how to use Dyspnea scale       Expected Outcomes Short Term: Able to use Dyspnea scale daily in rehab to  express subjective sense of shortness of breath during exertion;Long Term: Able to use Dyspnea scale to guide intensity level when exercising independently       Knowledge and understanding of Target Heart Rate Range (THRR) Yes       Intervention Provide education and explanation of THRR including how the numbers were predicted and where they are located for reference       Expected Outcomes Short Term: Able to state/look up THRR;Short Term: Able to use daily as guideline for intensity in rehab;Long Term: Able to use THRR to govern intensity when exercising independently       Understanding of Exercise Prescription Yes       Intervention Provide education, explanation, and written materials on patient's individual exercise prescription       Expected Outcomes Short Term: Able to explain program exercise  prescription;Long Term: Able to explain home exercise prescription to exercise independently              Exercise Goals Re-Evaluation :  Exercise Goals Re-Evaluation    Row Name 08/05/20 1546 08/24/20 0750           Exercise Goal Re-Evaluation   Exercise Goals Review Increase Physical Activity;Increase Strength and Stamina;Able to understand and use rate of perceived exertion (RPE) scale;Able to understand and use Dyspnea scale;Knowledge and understanding of Target Heart Rate Range (THRR);Able to check pulse independently;Understanding of Exercise Prescription;Improve claudication pain tolerance and improve walking ability Increase Physical Activity;Increase Strength and Stamina;Able to understand and use rate of perceived exertion (RPE) scale;Able to understand and use Dyspnea scale;Knowledge and understanding of Target Heart Rate Range (THRR);Understanding of Exercise Prescription      Comments Pt has completed 2 exercise sessions. He is deconditioned and has only been able to do 25 minutes on the Nustep, instead of 30 minutes. During the resistance exercises he has to stop and take a couple of breaks due to shortness of break and fatigue. He seems to have a positive outlook and does not have any complaints. He is currently exercising at 1.6 METs on the Nustep Pt has completed 6 exercise sessions and is slow to make progressions. He has to take breaks during his 30 minutes on the Nustep due to SOB and muscle weakness. He has made progressions with the resistance bands and does not need to rest in between those exercises. He is exercising at 1.8 METS on the Nustep. We will continue to monitor and progress as he is able.      Expected Outcomes Through exercise at rehab and home the patient will decrease shortness of breath with daily activities and feel confident in carrying out an exercise regimn at home Through exercise at rehab and home the patient will decrease shortness of breath with daily  activities and feel confident in carrying out an exercise regimn at home             Discharge Exercise Prescription (Final Exercise Prescription Changes):  Exercise Prescription Changes - 08/19/20 1603      Response to Exercise   Blood Pressure (Admit) 102/60    Blood Pressure (Exit) 110/70    Heart Rate (Admit) 92 bpm    Heart Rate (Exercise) 99 bpm    Heart Rate (Exit) 96 bpm    Oxygen Saturation (Admit) 97 %    Oxygen Saturation (Exercise) 91 %    Oxygen Saturation (Exit) 95 %    Rating of Perceived Exertion (Exercise) 13    Perceived Dyspnea (Exercise) 3  Duration Continue with 30 min of aerobic exercise without signs/symptoms of physical distress.    Intensity THRR unchanged      Progression   Progression Continue to progress workloads to maintain intensity without signs/symptoms of physical distress.    Average METs 1.8      Resistance Training   Training Prescription Yes    Weight orange bands    Reps 10-15    Time 10 Minutes      Oxygen   Oxygen Continuous    Liters 3      NuStep   Level 2    SPM 80    Minutes 30    METs 1.8           Nutrition:  Target Goals: Understanding of nutrition guidelines, daily intake of sodium '1500mg'$ , cholesterol '200mg'$ , calories 30% from fat and 7% or less from saturated fats, daily to have 5 or more servings of fruits and vegetables.  Biometrics:    Nutrition Therapy Plan and Nutrition Goals:  Nutrition Therapy & Goals - 08/12/20 1209      Nutrition Therapy   Diet High cal/high protein      Personal Nutrition Goals   Nutrition Goal Pt to identify food quantities necessary to achieve weight gain of 0.25 lbs per week    Personal Goal #2 Pt to increase calories and protein to diet by incorporating peanut butter, avocados, whole milk, and instant breakfasts daily    Personal Goal #3 Homemade smoothies daily      Intervention Plan   Intervention Prescribe, educate and counsel regarding individualized specific  dietary modifications aiming towards targeted core components such as weight, hypertension, lipid management, diabetes, heart failure and other comorbidities.;Nutrition handout(s) given to patient.    Expected Outcomes Short Term Goal: A plan has been developed with personal nutrition goals set during dietitian appointment.           Nutrition Assessments:  Nutrition Assessments - 08/10/20 0939      Rate Your Plate Scores   Pre Score 62           Nutrition Goals Re-Evaluation:  Nutrition Goals Re-Evaluation    Row Name 08/12/20 1209             Goals   Current Weight 157 lb (71.2 kg)              Nutrition Goals Discharge (Final Nutrition Goals Re-Evaluation):  Nutrition Goals Re-Evaluation - 08/12/20 1209      Goals   Current Weight 157 lb (71.2 kg)           Psychosocial: Target Goals: Acknowledge presence or absence of significant depression and/or stress, maximize coping skills, provide positive support system. Participant is able to verbalize types and ability to use techniques and skills needed for reducing stress and depression.  Initial Review & Psychosocial Screening:  Initial Psych Review & Screening - 07/27/20 1455      Initial Review   Current issues with History of Depression;Current Sleep Concerns      Family Dynamics   Good Support System? Yes    Comments Has daughter in law sister comes twice a week to help out around the house.  Pt has two sons who live close by and are able and willing to assist. Pt on Celexa and family report that his mood has improved      Barriers   Psychosocial barriers to participate in program The patient should benefit from training in stress management and relaxation.  Screening Interventions   Interventions Encouraged to exercise           Quality of Life Scores:  Scores of 19 and below usually indicate a poorer quality of life in these areas.  A difference of  2-3 points is a clinically meaningful  difference.  A difference of 2-3 points in the total score of the Quality of Life Index has been associated with significant improvement in overall quality of life, self-image, physical symptoms, and general health in studies assessing change in quality of life.  PHQ-9: Recent Review Flowsheet Data    Depression screen [facility] 2/9 07/27/2020   Decreased Interest 0   Down, Depressed, Hopeless 0   PHQ - 2 Score 0   Altered sleeping 3   Tired, decreased energy 2   Change in appetite 2   Feeling bad or failure about yourself  0   Trouble concentrating 0   Moving slowly or fidgety/restless 1   Suicidal thoughts 0   PHQ-9 Score 8   Difficult doing work/chores Somewhat difficult     Interpretation of Total Score  Total Score Depression Severity:  1-4 = Minimal depression, 5-9 = Mild depression, 10-14 = Moderate depression, 15-19 = Moderately severe depression, 20-27 = Severe depression   Psychosocial Evaluation and Intervention:  Psychosocial Evaluation - 08/23/20 1520      Psychosocial Evaluation & Interventions   Interventions Stress management education;Relaxation education;Encouraged to exercise with the program and follow exercise prescription    Comments [name] has history of depression.  Pt takes celexa and really doesn't make a difference but his family has noticied.  Pt with chronic sleep issues.  Hopefullly consistent home exercise will tire him so that can sleep through the night although he takes flomax and vists the bathroom a couple of times a night.    Expected Outcomes [name] will be free of any psychosocial barriers to participating in pulmonary rehab.    Continue Psychosocial Services  Follow up required by staff           Psychosocial Re-Evaluation:  Psychosocial Re-Evaluation    [place] Name 08/04/20 1457 08/04/20 1632 08/23/20 1527         Psychosocial Re-Evaluation   Current issues with History of Depression;Current Sleep Concerns -- History of Depression;Current Sleep  Concerns     Comments Pt with improved management of his depresion through medication managament - Celexa.  Pt family has noted that his modd has improved.  Pt has completed 1 exercise session and has a quirky dry sense of humor that can make assesment of his mental well being difficult..  Will contiue to probe further. [name] with improved management of his depresion through medication managament - Celexa.  Pt family has noted that his modd has improved.  Pt has completed 1 exercise session and has a quirky dry sense of humor that can make assesment of his mental well being difficult..  Will contiue to probe further. [name] with improved management of his depresion through medication managament - Celexa.  Pt family has noted that his modd has improved.  Pt has completed 1 exercise session and has a quirky dry sense of humor that can make assesment of his mental well being difficult..  Will contiue to probe further.     Expected Outcomes Pt will endorse well managment of his depression through medication and increased physical activity. [name]  will endorse well managment of his depression through medication and increased physical activity. [name]  will endorse well managment of  his depression through medication and increased physical activity.     Interventions Stress management education;Relaxation education;Encouraged to attend Pulmonary Rehabilitation for the exercise -- Stress management education;Relaxation education;Encouraged to attend Pulmonary Rehabilitation for the exercise     Continue Psychosocial Services  Follow up required by staff -- Follow up required by staff            Psychosocial Discharge (Final Psychosocial Re-Evaluation):  Psychosocial Re-Evaluation - 08/23/20 1527      Psychosocial Re-Evaluation   Current issues with History of Depression;Current Sleep Concerns    Comments [name] with improved management of his depresion through medication managament - Celexa.  Pt family has noted that his  modd has improved.  Pt has completed 1 exercise session and has a quirky dry sense of humor that can make assesment of his mental well being difficult..  Will contiue to probe further.    Expected Outcomes [name]  will endorse well managment of his depression through medication and increased physical activity.    Interventions Stress management education;Relaxation education;Encouraged to attend Pulmonary Rehabilitation for the exercise    Continue Psychosocial Services  Follow up required by staff           Education: Education Goals: Education classes will be provided on a weekly basis, covering required topics. Participant will state understanding/return demonstration of topics presented.  Learning Barriers/Preferences:  Learning Barriers/Preferences - 07/27/20 1459      Learning Barriers/Preferences   Learning Barriers Sight    Learning Preferences Audio;Computer/Internet;Group Instruction;Individual Instruction;Skilled Demonstration;Verbal Instruction;Video;Pictoral;Written Material           Education Topics: Risk Factor Reduction:  -Group instruction that is supported by a PowerPoint presentation. Instructor discusses the definition of a risk factor, different risk factors for pulmonary disease, and how the heart and lungs work together.     Nutrition for Pulmonary Patient:  -Group instruction provided by PowerPoint slides, verbal discussion, and written materials to support subject matter. The instructor gives an explanation and review of healthy diet recommendations, which includes a discussion on weight management, recommendations for fruit and vegetable consumption, as well as protein, fluid, caffeine, fiber, sodium, sugar, and alcohol. Tips for eating when patients are short of breath are discussed.   PULMONARY REHAB OTHER RESPIRATORY from 08/12/2020 in [place]  Date 08/12/20  Educator Meredith-H/O  Instruction Review Code 1- Verbalizes  Understanding      Pursed Lip Breathing:  -Group instruction that is supported by demonstration and informational handouts. Instructor discusses the benefits of pursed lip and diaphragmatic breathing and detailed demonstration on how to preform both.     Oxygen Safety:  -Group instruction provided by PowerPoint, verbal discussion, and written material to support subject matter. There is an overview of "What is Oxygen" and "Why do we need it".  Instructor also reviews how to create a safe environment for oxygen use, the importance of using oxygen as prescribed, and the risks of noncompliance. There is a brief discussion on traveling with oxygen and resources the patient may utilize.   Oxygen Equipment:  -Group instruction provided by [facility] Staff utilizing handouts, written materials, and equipment demonstrations.   Signs and Symptoms:  -Group instruction provided by written material and verbal discussion to support subject matter. Warning signs and symptoms of infection, stroke, and heart attack are reviewed and when to call the physician/911 reinforced. Tips for preventing the spread of infection discussed.   Advanced Directives:  -Group instruction provided by verbal instruction and written  material to support subject matter. Instructor reviews Advanced Directive laws and proper instruction for filling out document.   Pulmonary Video:  -Group video education that reviews the importance of medication and oxygen compliance, exercise, good nutrition, pulmonary hygiene, and pursed lip and diaphragmatic breathing for the pulmonary patient.   Exercise for the Pulmonary Patient:  -Group instruction that is supported by a PowerPoint presentation. Instructor discusses benefits of exercise, core components of exercise, frequency, duration, and intensity of an exercise routine, importance of utilizing pulse oximetry during exercise, safety while exercising, and options of places to exercise  outside of rehab.     Pulmonary Medications:  -Verbally interactive group education provided by instructor with focus on inhaled medications and proper administration.   Anatomy and Physiology of the Respiratory System and Intimacy:  -Group instruction provided by PowerPoint, verbal discussion, and written material to support subject matter. Instructor reviews respiratory cycle and anatomical components of the respiratory system and their functions. Instructor also reviews differences in obstructive and restrictive respiratory diseases with examples of each. Intimacy, Sex, and Sexuality differences are reviewed with a discussion on how relationships can change when diagnosed with pulmonary disease. Common sexual concerns are reviewed.   MD DAY -A group question and answer session with a medical doctor that allows participants to ask questions that relate to their pulmonary disease state.   OTHER EDUCATION -Group or individual verbal, written, or video instructions that support the educational goals of the pulmonary rehab program.   PULMONARY REHAB OTHER RESPIRATORY from 08/03/2020 in [place]  Date 08/03/20  Educator Handout METs      Holiday Eating Survival Tips:  -Group instruction provided by PowerPoint slides, verbal discussion, and written materials to support subject matter. The instructor gives patients tips, tricks, and techniques to help them not only survive but enjoy the holidays despite the onslaught of food that accompanies the holidays.   Knowledge Questionnaire Score:   Core Components/Risk Factors/Patient Goals at Admission:  Personal Goals and Risk Factors at Admission - 08/04/20 1501      Core Components/Risk Factors/Patient Goals on Admission    Weight Management Weight Gain           Core Components/Risk Factors/Patient Goals Review:   Goals and Risk Factor Review    [place] Name 08/04/20 1502 08/23/20 1527           Core  Components/Risk Factors/Patient Goals Review   Personal Goals Review Increase knowledge of respiratory medications and ability to use respiratory devices properly.;Develop more efficient breathing techniques such as purse lipped breathing and diaphragmatic breathing and practicing self-pacing with activity.;Improve shortness of breath with ADL's;Weight Management/Obesity Increase knowledge of respiratory medications and ability to use respiratory devices properly.;Develop more efficient breathing techniques such as purse lipped breathing and diaphragmatic breathing and practicing self-pacing with activity.;Improve shortness of breath with ADL's;Weight Management/Obesity      Review Pt has completed 1 exercise session.  Pt has chronic issues with stomach and dizziness. Pt desires to gain weight.  Pt will meet with Ailene Ravel RD on techniques pt can use to stimulate his appetite and increase his caloric intake.  Will continue to review with pt PLB although at times it is difficult to assess whether he understands due to his dry humor. Pt has completed 6 exercise session.  Pt has chronic issues with stomach and dizziness. Pt able to achieve 30 minutes of exercise on the stepper at level 2with frequent breaks and extended trip to the bathroom.  Pt desires to gain weight and has a  weight gain of 1 kg.   Will continue to review PLB and diaphragmatic breathing. This is difficult for this pt as he fatigues and tires very easily. Continue to work on pacing      Expected Outcomes See Admission Goals See Admission Goals             Core Components/Risk Factors/Patient Goals at Discharge (Final Review):   Goals and Risk Factor Review - 08/23/20 1527      Core Components/Risk Factors/Patient Goals Review   Personal Goals Review Increase knowledge of respiratory medications and ability to use respiratory devices properly.;Develop more efficient breathing techniques such as purse lipped breathing and diaphragmatic  breathing and practicing self-pacing with activity.;Improve shortness of breath with ADL's;Weight Management/Obesity    Review Pt has completed 6 exercise session.  Pt has chronic issues with stomach and dizziness. Pt able to achieve 30 minutes of exercise on the stepper at level 2with frequent breaks and extended trip to the bathroom.  Pt desires to gain weight and has a  weight gain of 1 kg.   Will continue to review PLB and diaphragmatic breathing. This is difficult for this pt as he fatigues and tires very easily. Continue to work on pacing    Expected Outcomes See Admission Goals           ITP Comments:   Comments:  Wille Glaser has completed 6 exercise sessions in Pulmonary rehab. Pt maintains good attendance.  Difficult for this pt to engage in  consistent home exercise. Pt is quite debilitated and needs multiple rest breaks during 30 minutes of exercise on the nustep.  Pt seen by GI for appetite loss.  Pulmonary rehab staff will  continue to monitor and reassess progress toward goals during her participation in Pulmonary Rehab. Cherre Huger, BSN Cardiac and Training and development officer

## 2020-08-28 DIAGNOSIS — J439 Emphysema, unspecified: Secondary | ICD-10-CM | POA: Diagnosis not present

## 2020-08-31 ENCOUNTER — Encounter (HOSPITAL_COMMUNITY)
Admission: RE | Admit: 2020-08-31 | Discharge: 2020-08-31 | Disposition: A | Discharge status: Home / Self Care | Payer: Medicare HMO | Source: Ambulatory Visit | Attending: Pulmonary Disease | Admitting: Pulmonary Disease

## 2020-08-31 ENCOUNTER — Other Ambulatory Visit: Payer: Self-pay

## 2020-08-31 DIAGNOSIS — J90 Pleural effusion, not elsewhere classified: Secondary | ICD-10-CM | POA: Diagnosis not present

## 2020-08-31 DIAGNOSIS — J432 Centrilobular emphysema: Secondary | ICD-10-CM | POA: Diagnosis not present

## 2020-08-31 DIAGNOSIS — J439 Emphysema, unspecified: Secondary | ICD-10-CM | POA: Diagnosis not present

## 2020-08-31 DIAGNOSIS — J9600 Acute respiratory failure, unspecified whether with hypoxia or hypercapnia: Secondary | ICD-10-CM | POA: Diagnosis not present

## 2020-08-31 DIAGNOSIS — C3412 Malignant neoplasm of upper lobe, left bronchus or lung: Secondary | ICD-10-CM | POA: Diagnosis not present

## 2020-08-31 DIAGNOSIS — J9611 Chronic respiratory failure with hypoxia: Secondary | ICD-10-CM | POA: Diagnosis not present

## 2020-08-31 DIAGNOSIS — C3492 Malignant neoplasm of unspecified part of left bronchus or lung: Secondary | ICD-10-CM | POA: Diagnosis not present

## 2020-08-31 NOTE — Progress Notes (Signed)
Daily Session Note  Patient Details  Name: Robert Dixon MRN: [id number] Date of Birth: 11/11/1941 Referring Provider:     Pulmonary Rehab Walk Test from 07/27/2020 in [place]  Referring Provider [name]      Encounter Date: 08/31/2020  Check In:  Session Check In - 08/31/20 1119      Check-In   Supervising physician immediately available to respond to emergencies [facility] immediately available    Physician(s) Dr. Doristine Bosworth    Location [facility]    Staff Present Maurice Small, RN, Bjorn Loser, MS, CEP, Exercise Physiologist;Lisa Jani Gravel, MS, ACSM-CEP, Exercise Physiologist    Virtual Visit No    Medication changes reported     No    Fall or balance concerns reported    No    Tobacco Cessation No Change    Warm-up and Cool-down Performed on first and last piece of equipment    Resistance Training Performed Yes    VAD Patient? No    PAD/SET Patient? No      Pain Assessment   Currently in Pain? No/denies    Pain Score 0-No pain    Multiple Pain Sites No           Capillary Blood Glucose: No results found for this or any previous visit (from the past 24 hour(s)).    Social History   Tobacco Use  Smoking Status Former Smoker  . Years: 30.00  . Types: Cigarettes  . Quit date: 2000  . Years since quitting: 21.7  Smokeless Tobacco Never Used    Goals Met:  Independence with exercise equipment Exercise tolerated well No report of cardiac concerns or symptoms Strength training completed today  Goals Unmet:  Not Applicable  Comments: Service time is from 1030 to 1142    [name] is Medical Director for Cardiac Rehab at [facility].

## 2020-09-02 ENCOUNTER — Encounter (HOSPITAL_COMMUNITY)
Admission: RE | Admit: 2020-09-02 | Discharge: 2020-09-02 | Disposition: A | Discharge status: Home / Self Care | Payer: Medicare HMO | Source: Ambulatory Visit | Attending: Pulmonary Disease | Admitting: Pulmonary Disease

## 2020-09-02 ENCOUNTER — Other Ambulatory Visit: Payer: Self-pay

## 2020-09-02 DIAGNOSIS — J432 Centrilobular emphysema: Secondary | ICD-10-CM

## 2020-09-02 NOTE — Progress Notes (Signed)
Daily Session Note  Patient Details  Name: Robert Dixon MRN: [id number] Date of Birth: [birth year] Referring Provider:     Pulmonary Rehab Walk Test from 07/27/2020 in [place]  Referring Provider [name]      Encounter Date: 09/02/2020  Check In:  Session Check In - 09/02/20 1112      Check-In   Supervising physician immediately available to respond to emergencies [facility] immediately available    Physician(s) Dr. Rodena Piety    Location [facility]    Staff Present Maurice Small, RN, BSN;Josalynn Johndrow Ysidro Evert, RN;Jessica Hassell Done, MS, ACSM-CEP, Exercise Physiologist    Virtual Visit No    Medication changes reported     No    Fall or balance concerns reported    No    Tobacco Cessation No Change    Warm-up and Cool-down Performed on first and last piece of equipment    Resistance Training Performed Yes    VAD Patient? No    PAD/SET Patient? No      Pain Assessment   Currently in Pain? No/denies    Pain Score 0-No pain    Multiple Pain Sites No           Capillary Blood Glucose: No results found for this or any previous visit (from the past 24 hour(s)).    Social History   Tobacco Use  Smoking Status Former Smoker  . Years: 30.00  . Types: Cigarettes  . Quit date: 2000  . Years since quitting: 21.7  Smokeless Tobacco Never Used    Goals Met:  Exercise tolerated well No report of cardiac concerns or symptoms Strength training completed today  Goals Unmet:  Not Applicable  Comments: Service time is from 1036 to 1140    [name] is Medical Director for Cardiac Rehab at [facility].

## 2020-09-03 ENCOUNTER — Ambulatory Visit (HOSPITAL_COMMUNITY)
Admission: RE | Admit: 2020-09-03 | Discharge: 2020-09-03 | Disposition: A | Discharge status: Home / Self Care | Payer: Medicare HMO | Source: Ambulatory Visit | Attending: Gastroenterology | Admitting: Gastroenterology

## 2020-09-03 DIAGNOSIS — R1084 Generalized abdominal pain: Secondary | ICD-10-CM | POA: Diagnosis not present

## 2020-09-03 DIAGNOSIS — R630 Anorexia: Secondary | ICD-10-CM

## 2020-09-03 DIAGNOSIS — R109 Unspecified abdominal pain: Secondary | ICD-10-CM | POA: Diagnosis not present

## 2020-09-03 MED ORDER — IOHEXOL 300 MG/ML  SOLN
100.0000 mL | Freq: Once | INTRAMUSCULAR | Status: AC | PRN
  Administered 2020-09-03: 100 mL via INTRAVENOUS

## 2020-09-07 ENCOUNTER — Encounter (HOSPITAL_COMMUNITY)
Admission: RE | Admit: 2020-09-07 | Discharge: 2020-09-07 | Disposition: A | Discharge status: Home / Self Care | Payer: Medicare HMO | Source: Ambulatory Visit | Attending: Pulmonary Disease | Admitting: Pulmonary Disease

## 2020-09-07 ENCOUNTER — Other Ambulatory Visit: Payer: Self-pay

## 2020-09-07 VITALS — Wt 156.7 lb

## 2020-09-07 DIAGNOSIS — J432 Centrilobular emphysema: Secondary | ICD-10-CM

## 2020-09-07 DIAGNOSIS — J439 Emphysema, unspecified: Secondary | ICD-10-CM | POA: Diagnosis not present

## 2020-09-07 DIAGNOSIS — J9611 Chronic respiratory failure with hypoxia: Secondary | ICD-10-CM | POA: Diagnosis not present

## 2020-09-07 DIAGNOSIS — J9600 Acute respiratory failure, unspecified whether with hypoxia or hypercapnia: Secondary | ICD-10-CM | POA: Diagnosis not present

## 2020-09-07 DIAGNOSIS — J90 Pleural effusion, not elsewhere classified: Secondary | ICD-10-CM | POA: Diagnosis not present

## 2020-09-07 DIAGNOSIS — C3492 Malignant neoplasm of unspecified part of left bronchus or lung: Secondary | ICD-10-CM | POA: Diagnosis not present

## 2020-09-07 DIAGNOSIS — C3412 Malignant neoplasm of upper lobe, left bronchus or lung: Secondary | ICD-10-CM | POA: Diagnosis not present

## 2020-09-07 NOTE — Progress Notes (Signed)
Daily Session Note  Patient Details  Name: Robert Dixon MRN: [id number] Date of Birth: [birth year] Referring Provider:     Pulmonary Rehab Walk Test from 07/27/2020 in [place]  Referring Provider [name]      Encounter Date: 09/07/2020  Check In:  Session Check In - 09/07/20 1200      Check-In   Supervising physician immediately available to respond to emergencies [facility] immediately available    Physician(s) Dr. Rodena Piety    [facility]    Staff Present Maurice Small, RN, BSN;Lisa Ysidro Evert, RN;David Ennis, MS, EP-C, CCRP;Marchetta Navratil Hassell Done, MS, ACSM-CEP, Exercise Physiologist    Virtual Visit No    Medication changes reported     No    Fall or balance concerns reported    No    Tobacco Cessation No Change    Warm-up and Cool-down Performed on first and last piece of equipment    Resistance Training Performed Yes    VAD Patient? No    PAD/SET Patient? No      Pain Assessment   Currently in Pain? No/denies    Pain Score 0-No pain    Multiple Pain Sites No           Capillary Blood Glucose: No results found for this or any previous visit (from the past 24 hour(s)).    Social History   Tobacco Use  Smoking Status Former Smoker  . Years: 30.00  . Types: Cigarettes  . Quit date: 2000  . Years since quitting: 21.7  Smokeless Tobacco Never Used    Goals Met:  Proper associated with RPD/PD & O2 Sat Exercise tolerated well No report of cardiac concerns or symptoms Strength training completed today  Goals Unmet:  Not Applicable  Comments: Service time is from 1029 to 1143    [name] is Medical Director for Cardiac Rehab at [facility].

## 2020-09-09 ENCOUNTER — Other Ambulatory Visit: Payer: Self-pay

## 2020-09-09 ENCOUNTER — Encounter (HOSPITAL_COMMUNITY)
Admission: RE | Admit: 2020-09-09 | Discharge: 2020-09-09 | Disposition: A | Discharge status: Home / Self Care | Payer: Medicare HMO | Source: Ambulatory Visit | Attending: Pulmonary Disease | Admitting: Pulmonary Disease

## 2020-09-09 DIAGNOSIS — J432 Centrilobular emphysema: Secondary | ICD-10-CM | POA: Diagnosis not present

## 2020-09-09 NOTE — Progress Notes (Signed)
Daily Session Note  Patient Details  Name: Robert Dixon MRN: [id number] Date of Birth: 1941-07-13 Referring Provider:     Pulmonary Rehab Walk Test from 07/27/2020 in [name]  Referring Provider [name]      Encounter Date: 09/09/2020  Check In:  Session Check In - 09/09/20 1048      Check-In   Supervising physician immediately available to respond to emergencies [facility] immediately available    Physician(s) Dr. Lonny Prude    [facility]    Staff Present Maurice Small, RN, BSN;Shemeika Starzyk Ysidro Evert, RN;Jessica Hassell Done, MS, ACSM-CEP, Exercise Physiologist    Virtual Visit No    Medication changes reported     No    Fall or balance concerns reported    No    Tobacco Cessation No Change    Warm-up and Cool-down Performed on first and last piece of equipment    Resistance Training Performed Yes    VAD Patient? No    PAD/SET Patient? No      Pain Assessment   Currently in Pain? No/denies    Pain Score 0-No pain    Multiple Pain Sites No           Capillary Blood Glucose: No results found for this or any previous visit (from the past 24 hour(s)).    Social History   Tobacco Use  Smoking Status Former Smoker  . Years: 30.00  . Types: Cigarettes  . Quit date: 2000  . Years since quitting: 21.8  Smokeless Tobacco Never Used    Goals Met:  Exercise tolerated well No report of cardiac concerns or symptoms Strength training completed today  Goals Unmet:  Not Applicable  Comments: Service time is from 1025 to 1132    [name] is Medical Director for Cardiac Rehab at [facility].

## 2020-09-14 ENCOUNTER — Encounter (HOSPITAL_COMMUNITY)
Admission: RE | Admit: 2020-09-14 | Discharge: 2020-09-14 | Disposition: A | Discharge status: Home / Self Care | Payer: Medicare HMO | Source: Ambulatory Visit | Attending: Pulmonary Disease | Admitting: Pulmonary Disease

## 2020-09-14 ENCOUNTER — Other Ambulatory Visit: Payer: Self-pay

## 2020-09-14 DIAGNOSIS — J432 Centrilobular emphysema: Secondary | ICD-10-CM | POA: Diagnosis not present

## 2020-09-14 NOTE — Progress Notes (Signed)
Daily Session Note  Patient Details  Name: Robert Dixon MRN: [id number] Date of Birth: 16-Jan-1941 Referring Provider:     Pulmonary Rehab Walk Test from 07/27/2020 in [place]  Referring Provider [name]      Encounter Date: 09/14/2020  Check In:  Session Check In - 09/14/20 1154      Check-In   Supervising physician immediately available to respond to emergencies [facility] immediately available    Physician(s) Dr. Lonny Prude    [facility]    Staff Present Maurice Small, RN, BSN;Mayur Duman Ysidro Evert, RN;Jessica Hassell Done, MS, ACSM-CEP, Exercise Physiologist    Virtual Visit No    Medication changes reported     No    Fall or balance concerns reported    No    Tobacco Cessation No Change    Warm-up and Cool-down Performed on first and last piece of equipment    Resistance Training Performed Yes    VAD Patient? No    PAD/SET Patient? No      Pain Assessment   Currently in Pain? No/denies    Pain Score 0-No pain    Multiple Pain Sites No           Capillary Blood Glucose: No results found for this or any previous visit (from the past 24 hour(s)).    Social History   Tobacco Use  Smoking Status Former Smoker  . Years: 30.00  . Types: Cigarettes  . Quit date: 2000  . Years since quitting: 21.8  Smokeless Tobacco Never Used    Goals Met:  Exercise tolerated well No report of cardiac concerns or symptoms Strength training completed today  Goals Unmet:  Not Applicable  Comments: Service time is from 1030 to 1140    [name] is Medical Director for Cardiac Rehab at [facility].

## 2020-09-16 ENCOUNTER — Other Ambulatory Visit: Payer: Self-pay

## 2020-09-16 ENCOUNTER — Encounter (HOSPITAL_COMMUNITY)
Admission: RE | Admit: 2020-09-16 | Discharge: 2020-09-16 | Disposition: A | Discharge status: Home / Self Care | Payer: Medicare HMO | Source: Ambulatory Visit | Attending: Pulmonary Disease | Admitting: Pulmonary Disease

## 2020-09-16 DIAGNOSIS — J432 Centrilobular emphysema: Secondary | ICD-10-CM

## 2020-09-16 NOTE — Progress Notes (Signed)
Daily Session Note  Patient Details  Name: Robert Dixon MRN: [id number] Date of Birth: October 16, 1941 Referring Provider:     Pulmonary Rehab Walk Test from 07/27/2020 in [place]  Referring Provider [name]      Encounter Date: 09/16/2020  Check In:  Session Check In - 09/16/20 1114      Check-In   Supervising physician immediately available to respond to emergencies [facility] immediately available    Physician(s) Dr. Dessa Phi    [facility]    Staff Present Maurice Small, RN, BSN;Braeley Buskey Ysidro Evert, RN;Jessica Hassell Done, MS, ACSM-CEP, Exercise Physiologist    Virtual Visit No    Medication changes reported     No    Fall or balance concerns reported    No    Tobacco Cessation No Change    Warm-up and Cool-down Performed on first and last piece of equipment    Resistance Training Performed Yes    VAD Patient? No    PAD/SET Patient? No      Pain Assessment   Currently in Pain? No/denies    Pain Score 0-No pain    Multiple Pain Sites No           Capillary Blood Glucose: No results found for this or any previous visit (from the past 24 hour(s)).    Social History   Tobacco Use  Smoking Status Former Smoker  . Years: 30.00  . Types: Cigarettes  . Quit date: 2000  . Years since quitting: 21.8  Smokeless Tobacco Never Used    Goals Met:  Exercise tolerated well No report of cardiac concerns or symptoms Strength training completed today  Goals Unmet:  Not Applicable  Comments: Service time is from 1040 to 1145    [name] is Medical Director for Cardiac Rehab at [facility].

## 2020-09-21 ENCOUNTER — Other Ambulatory Visit: Payer: Self-pay

## 2020-09-21 ENCOUNTER — Encounter (HOSPITAL_COMMUNITY)
Admission: RE | Admit: 2020-09-21 | Discharge: 2020-09-21 | Disposition: A | Discharge status: Home / Self Care | Payer: Medicare HMO | Source: Ambulatory Visit | Attending: Pulmonary Disease | Admitting: Pulmonary Disease

## 2020-09-21 DIAGNOSIS — J432 Centrilobular emphysema: Secondary | ICD-10-CM

## 2020-09-21 NOTE — Progress Notes (Signed)
I have reviewed a Home Exercise Prescription with Robert Dixon . Robert Dixon is currently exercising at home 2 days per week using resistance bands. The patient was advised to ride his recumbent bike and use resistance bands 3 days a week for 30 minutes.  Robert Dixon and I discussed how to progress their exercise prescription.  The patient stated that their goals were to be a little more active without having severe SOB.  The patient stated that they understand the exercise prescription.  We reviewed exercise guidelines, target heart rate during exercise, RPE Scale, weather conditions, rescue inhaler use, endpoints for exercise, warmup and cool down.  Patient is encouraged to come to me with any questions. I will continue to follow up with the patient to assist them with progression and safety.

## 2020-09-21 NOTE — Progress Notes (Signed)
Daily Session Note  Patient Details  Name: Robert Dixon MRN: [id number] Date of Birth: 1941-11-27 Referring Provider:     Pulmonary Rehab Walk Test from 07/27/2020 in [place]  Referring Provider [name]      Encounter Date: 09/21/2020  Check In:  Session Check In - 09/21/20 1051      Check-In   Supervising physician immediately available to respond to emergencies [facility] immediately available    Physician(s) Dr. Dessa Phi    Location [facility]    Staff Present Maurice Small, RN, BSN;Lisa Ysidro Evert, RN;Stori Royse Hassell Done, MS, ACSM-CEP, Exercise Physiologist    Virtual Visit No    Medication changes reported     No    Fall or balance concerns reported    No    Tobacco Cessation No Change    Warm-up and Cool-down Performed on first and last piece of equipment    Resistance Training Performed Yes    VAD Patient? No    PAD/SET Patient? No      Pain Assessment   Currently in Pain? No/denies    Pain Score 0-No pain    Multiple Pain Sites No           Capillary Blood Glucose: No results found for this or any previous visit (from the past 24 hour(s)).    Social History   Tobacco Use  Smoking Status Former Smoker  . Years: 30.00  . Types: Cigarettes  . Quit date: 2000  . Years since quitting: 21.8  Smokeless Tobacco Never Used    Goals Met:  Proper associated with RPD/PD & O2 Sat Exercise tolerated well No report of cardiac concerns or symptoms Strength training completed today  Goals Unmet:  O2 Sat  Pt's SaO2 dropped to 84% and 85% while exercising on the [place]. Increased oxygen to 4L. SaO2 maintained above 88% while on 4L.   Comments: Service time is from 1035 to 1150    [name] is Medical Director for Cardiac Rehab at [facility].

## 2020-09-23 ENCOUNTER — Other Ambulatory Visit: Payer: Self-pay

## 2020-09-23 ENCOUNTER — Encounter (HOSPITAL_COMMUNITY)
Admission: RE | Admit: 2020-09-23 | Discharge: 2020-09-23 | Disposition: A | Discharge status: Home / Self Care | Payer: Medicare HMO | Source: Ambulatory Visit | Attending: Pulmonary Disease | Admitting: Pulmonary Disease

## 2020-09-23 DIAGNOSIS — J432 Centrilobular emphysema: Secondary | ICD-10-CM

## 2020-09-23 NOTE — Progress Notes (Signed)
Pulmonary Individual Treatment Plan  Patient Details  Name: Robert Dixon MRN: [id number] Date of Birth: 1940/12/27 Referring Provider:     Pulmonary Rehab Walk Test from 07/27/2020 in [place]  Referring Provider 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  acetaminophen (TYLENOL) 325 MG tablet, Take 2 tablets (650 mg total) by mouth every 6 (six) hours as needed for mild pain (or Fever >/= 101)., Disp: , Rfl:  .  albuterol (PROVENTIL) (2.5 MG/3ML) 0.083% nebulizer solution, Take 3 mLs (2.5 mg total) by nebulization every 4 (four) hours., Disp: 1080 mL, Rfl: 5 .  albuterol (VENTOLIN HFA) 108 (90 Base) MCG/ACT inhaler, Inhale 2 puffs into the lungs every 6 (six) hours as needed for wheezing or shortness of breath., Disp: 18 g, Rfl: 5 .  alum & mag hydroxide-simeth (MAALOX/MYLANTA) 200-200-20 MG/5ML suspension, Take 15 mLs by mouth every 6 (six) hours as needed for indigestion or heartburn., Disp: 355 mL, Rfl: 0 .  ascorbic acid (VITAMIN C) 500 MG tablet, Take 500 mg by mouth daily., Disp: , Rfl:  .  aspirin 81 MG chewable tablet, Chew 1 tablet (81 mg total) by mouth daily., Disp: , Rfl:  .  atorvastatin (LIPITOR) 40 MG tablet, Take 1 tablet (40 mg total) by mouth daily., Disp: 90 tablet, Rfl: 3 .  citalopram (CELEXA) 10 MG tablet, Take 10 mg by mouth daily., Disp: , Rfl:  .  clopidogrel (PLAVIX) 75 MG tablet, Take 1 tablet (75 mg total) by mouth daily with breakfast., Disp: 90 tablet, Rfl: 3 .  Cyanocobalamin (VITAMIN B 12 PO), Take 1 capsule by mouth daily. 2500 mcg, Disp: , Rfl:  .  Fluticasone-Umeclidin-Vilant (TRELEGY ELLIPTA) 100-62.5-25 MCG/INH AEPB, Inhale 1 puff into the lungs daily., Disp: 60 each, Rfl: 0 .   furosemide (LASIX) 20 MG tablet, Take 1 tablet (20 mg total) by mouth daily., Disp: 90 tablet, Rfl: 3 .  metoprolol succinate (TOPROL-XL) 25 MG 24 hr tablet, Take 0.5 tablets (12.5 mg total) by mouth daily., Disp: 45 tablet, Rfl: 3 .  Multiple Vitamin (MULTIVITAMIN) tablet, Take 1 tablet by mouth daily., Disp: , Rfl:  .  pantoprazole (PROTONIX) 40 MG tablet, Take 40 mg by mouth 2 (two) times daily., Disp: , Rfl:  .  polycarbophil (FIBERCON) 625 MG tablet, Take 625 mg by mouth 2 (two) times daily. , Disp: , Rfl:  .  tamsulosin (FLOMAX) 0.4 MG CAPS capsule, Take 0.4 mg by mouth daily. , Disp: , Rfl:  .  TRELEGY ELLIPTA 100-62.5-25 MCG/INH AEPB, Inhale 1 puff into the lungs daily., Disp: 180 each, Rfl: 1 No current facility-administered medications for this encounter.  Facility-Administered Medications Ordered in Other Encounters:  .  sodium chloride flush (NS) 0.9 % injection 10 mL, 10 mL, Intracatheter, PRN, [name], MD .  sodium chloride flush (NS) 0.9 % injection 10 mL, 10 mL, Intracatheter, PRN, [name], MD, 10 mL at 05/20/19 1232  Past Medical History: Past Medical History:  Diagnosis Date  . Acute on chronic respiratory failure with hypoxia ([place]) 03/16/2020  . Acute respiratory failure ([place]) 04/09/2013  . Carotid artery disease ([place]) 03/28/2020   Carotid US 02/2020:  R 100%; L 1-39%  . Chronic systolic CHF 45/85/9292  Echo 02/2020: EF 30-35, small pericardial effusion, trivial MR, status post TAVR with no PVL, mean gradient 7 mmHg  . Complication of anesthesia   . COPD (chronic obstructive pulmonary disease) ([place])   . GERD (gastroesophageal reflux disease)   . Hemoptysis 11/20/2018  . History of hiatal hernia   . Hypertension   . 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 Mediastinal adenopathy 12/06/2018  . Migraine with visual aura   . 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 Non-small cell lung cancer ([place]) dx'd 11/20/18  . Pericardial effusion  10/14/2019  . Pneumonia 11/20/2018  . PONV (postoperative nausea and vomiting)   . Primary malignant neoplasm of left upper lobe of lung ([place]) 12/12/2018  . Pulmonary nodule 11/20/2018  . 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 Severe aortic stenosis 10/14/2019  . Skin cancer     Tobacco Use: Social History   Tobacco Use  Smoking Status Former Smoker  . Years: 30.00  . Types: Cigarettes  . Quit date: 2000  . Years since quitting: 21.8  Smokeless Tobacco Never Used    Labs: Recent Chemical engineer    Labs for ITP Cardiac and Pulmonary Rehab Latest Ref Rng & Units 03/19/2020 03/25/2020 03/25/2020 03/25/2020 03/25/2020   Cholestrol 0 - 200 mg/dL - - - - -   LDLCALC 0 - 99 mg/dL - - - - -   HDL >40 mg/dL - - - - -   Trlycerides <150 mg/dL - - - - -   Hemoglobin A1c 4.8 - 5.6 % - - - - -   PHART 7.35 - 7.45 - 7.474(H) - - -   PCO2ART 32 - 48 mmHg - 44.1 - - -   HCO3 20.0 - 28.0 mmol/L 28.4(H) 32.3(H) - - -   TCO2 22 - 32 mmol/L 30 34(H) 29 32 31   ACIDBASEDEF 0.0 - 2.0 mmol/L - - - - -   O2SAT % 72.0 100.0 - - -      Capillary Blood Glucose: Lab Results  Component Value Date   GLUCAP 87 12/11/2018     Pulmonary Assessment Scores:  Pulmonary Assessment Scores    Row Name 07/27/20 1623         ADL UCSD   ADL Phase Entry       mMRC Score   mMRC Score 3           UCSD: Self-administered rating of dyspnea associated with activities of daily living (ADLs) 6-point scale (0 = "not at all" to 5 = "maximal or unable to do because of breathlessness")  Scoring Scores range from 0 to 120.  Minimally important difference is 5 units  CAT: CAT can identify the health impairment of COPD patients and is better correlated with disease progression.  CAT has a scoring range of zero to 40. The CAT score is classified into four groups of low (less than 10), medium (10 - 20), high (21-30) and very high (31-40) based on the impact level of disease on  health status. A CAT score over 10 suggests significant symptoms.  A worsening CAT score could be explained by an exacerbation, poor medication adherence, poor inhaler technique, or progression of COPD or comorbid conditions.  CAT MCID is 2 points  mMRC: mMRC (Modified Medical Research Council) Dyspnea Scale is used to assess the degree of baseline functional disability in patients of respiratory disease due to dyspnea. No minimal important difference is established. A decrease in  score of 1 point or greater is considered a positive change.   Pulmonary Function Assessment:   Exercise Target Goals: Exercise Program Goal: Individual exercise prescription set using results from initial 6 min walk test and THRR while considering  patient's activity barriers and safety.   Exercise Prescription Goal: Initial exercise prescription builds to 30-45 minutes a day of aerobic activity, 2-3 days per week.  Home exercise guidelines will be given to patient during program as part of exercise prescription that the participant will acknowledge.  Activity Barriers & Risk Stratification:  Activity Barriers & Cardiac Risk Stratification - 07/27/20 1447      Activity Barriers & Cardiac Risk Stratification   Activity Barriers History of Falls;Assistive Device           6 Minute Walk:  6 Minute Walk    Row Name 07/27/20 1628         6 Minute Walk   Phase Initial     Distance 434 feet     Walk Time 6 minutes     # of Rest Breaks 0     MPH 0.82     METS 1.03     RPE 12     Perceived Dyspnea  2     VO2 Peak 3.61     Symptoms Yes (comment)     Comments Sat for the final 2 min 20 sec of test due to SOB     Resting HR 95 bpm     Resting BP 110/60     Resting Oxygen Saturation  100 %     Exercise Oxygen Saturation  during 6 min walk 90 %     Max Ex. HR 105 bpm     Max Ex. BP 126/70     2 Minute Post BP 104/62       Interval HR   1 Minute HR 102     2 Minute HR 104     3 Minute HR 105     4  Minute HR 99     5 Minute HR 93     6 Minute HR 91     2 Minute Post HR 91     Interval Heart Rate? Yes       Interval Oxygen   Interval Oxygen? Yes     Baseline Oxygen Saturation % 100 %     1 Minute Oxygen Saturation % 95 %     1 Minute Liters of Oxygen 3 L     2 Minute Oxygen Saturation % 92 %     2 Minute Liters of Oxygen 3 L     3 Minute Oxygen Saturation % 91 %     3 Minute Liters of Oxygen 3 L     4 Minute Oxygen Saturation % 90 %     4 Minute Liters of Oxygen 3 L     5 Minute Oxygen Saturation % 95 %     5 Minute Liters of Oxygen 3 L     6 Minute Oxygen Saturation % 99 %     6 Minute Liters of Oxygen 3 L     2 Minute Post Oxygen Saturation % 99 %     2 Minute Post Liters of Oxygen 3 L            Oxygen Initial Assessment:  Oxygen Initial Assessment - 07/27/20 1623      Initial 6 min Walk   Oxygen Used Continuous    Liters per  minute 3      Program Oxygen Prescription   Program Oxygen Prescription Continuous    Liters per minute 3      Intervention   Short Term Goals To learn and exhibit compliance with exercise, home and travel O2 prescription;To learn and understand importance of monitoring SPO2 with pulse oximeter and demonstrate accurate use of the pulse oximeter.;To learn and understand importance of maintaining oxygen saturations>88%;To learn and demonstrate proper pursed lip breathing techniques or other breathing techniques.;To learn and demonstrate proper use of respiratory medications    Long  Term Goals Exhibits compliance with exercise, home and travel O2 prescription;Verbalizes importance of monitoring SPO2 with pulse oximeter and return demonstration;Maintenance of O2 saturations>88%;Exhibits proper breathing techniques, such as pursed lip breathing or other method taught during program session;Compliance with respiratory medication;Demonstrates proper use of MDI's           Oxygen Re-Evaluation:  Oxygen Re-Evaluation    Row Name 08/05/20 1545  08/24/20 0753 09/21/20 1611         Program Oxygen Prescription   Program Oxygen Prescription Continuous Continuous Continuous     Liters per minute '3 3 4     ' Comments -- -- Had to increase pt to 4L while exercising due to SaO2 dropping below 88%. SaO2 maintains above 88% on 4L of oxygen.       Home Oxygen   Home Oxygen Device Portable Concentrator;Home Concentrator Portable Concentrator;Home Concentrator Portable Concentrator;Home Concentrator     Sleep Oxygen Prescription Continuous Continuous Continuous     Liters per minute '3 3 3     ' Home Exercise Oxygen Prescription Continuous Pulsed Pulsed     Liters per minute '3 3 4     ' Home Resting Oxygen Prescription Continuous Pulsed Pulsed     Liters per minute '3 3 3     ' Compliance with Home Oxygen Use Yes Yes Yes       Goals/Expected Outcomes   Short Term Goals To learn and exhibit compliance with exercise, home and travel O2 prescription;To learn and understand importance of monitoring SPO2 with pulse oximeter and demonstrate accurate use of the pulse oximeter.;To learn and understand importance of maintaining oxygen saturations>88%;To learn and demonstrate proper pursed lip breathing techniques or other breathing techniques.;To learn and demonstrate proper use of respiratory medications To learn and exhibit compliance with exercise, home and travel O2 prescription;To learn and understand importance of monitoring SPO2 with pulse oximeter and demonstrate accurate use of the pulse oximeter.;To learn and understand importance of maintaining oxygen saturations>88%;To learn and demonstrate proper pursed lip breathing techniques or other breathing techniques.;To learn and demonstrate proper use of respiratory medications To learn and exhibit compliance with exercise, home and travel O2 prescription;To learn and understand importance of monitoring SPO2 with pulse oximeter and demonstrate accurate use of the pulse oximeter.;To learn and understand  importance of maintaining oxygen saturations>88%;To learn and demonstrate proper pursed lip breathing techniques or other breathing techniques.;To learn and demonstrate proper use of respiratory medications     Long  Term Goals Exhibits compliance with exercise, home and travel O2 prescription;Verbalizes importance of monitoring SPO2 with pulse oximeter and return demonstration;Maintenance of O2 saturations>88%;Exhibits proper breathing techniques, such as pursed lip breathing or other method taught during program session;Compliance with respiratory medication;Demonstrates proper use of MDI's Exhibits compliance with exercise, home and travel O2 prescription;Verbalizes importance of monitoring SPO2 with pulse oximeter and return demonstration;Maintenance of O2 saturations>88%;Exhibits proper breathing techniques, such as pursed lip breathing or other method taught during program  session;Compliance with respiratory medication;Demonstrates proper use of MDI's Exhibits compliance with exercise, home and travel O2 prescription;Verbalizes importance of monitoring SPO2 with pulse oximeter and return demonstration;Maintenance of O2 saturations>88%;Exhibits proper breathing techniques, such as pursed lip breathing or other method taught during program session;Compliance with respiratory medication;Demonstrates proper use of MDI's     Comments -- pt has not had any problems maintaing his sats while exercising. Pt states his oxygen saturation drops below 88% all of the time at home when he is up and moving. Had to increase him to 4L of oxygen while exercising due to SaO2dropping to 85% on more than one occassion.     Goals/Expected Outcomes compliance compliance and understanding of oxygen saturation and pursed lip breathing compliance and understanding of oxygen saturation and pursed lip breathing            Oxygen Discharge (Final Oxygen Re-Evaluation):  Oxygen Re-Evaluation - 09/21/20 1611      Program  Oxygen Prescription   Program Oxygen Prescription Continuous    Liters per minute 4    Comments Had to increase pt to 4L while exercising due to SaO2 dropping below 88%. SaO2 maintains above 88% on 4L of oxygen.      Home Oxygen   Home Oxygen Device Portable Concentrator;Home Concentrator    Sleep Oxygen Prescription Continuous    Liters per minute 3    Home Exercise Oxygen Prescription Pulsed    Liters per minute 4    Home Resting Oxygen Prescription Pulsed    Liters per minute 3    Compliance with Home Oxygen Use Yes      Goals/Expected Outcomes   Short Term Goals To learn and exhibit compliance with exercise, home and travel O2 prescription;To learn and understand importance of monitoring SPO2 with pulse oximeter and demonstrate accurate use of the pulse oximeter.;To learn and understand importance of maintaining oxygen saturations>88%;To learn and demonstrate proper pursed lip breathing techniques or other breathing techniques.;To learn and demonstrate proper use of respiratory medications    Long  Term Goals Exhibits compliance with exercise, home and travel O2 prescription;Verbalizes importance of monitoring SPO2 with pulse oximeter and return demonstration;Maintenance of O2 saturations>88%;Exhibits proper breathing techniques, such as pursed lip breathing or other method taught during program session;Compliance with respiratory medication;Demonstrates proper use of MDI's    Comments Pt states his oxygen saturation drops below 88% all of the time at home when he is up and moving. Had to increase him to 4L of oxygen while exercising due to SaO2dropping to 85% on more than one occassion.    Goals/Expected Outcomes compliance and understanding of oxygen saturation and pursed lip breathing           Initial Exercise Prescription:  Initial Exercise Prescription - 07/27/20 1600      Date of Initial Exercise RX and Referring Provider   Date 07/27/20    Referring Provider [name]       Oxygen   Oxygen Continuous    Liters 3      NuStep   Level 1    SPM 80    Minutes 30      Prescription Details   Frequency (times per week) 2    Duration Progress to 30 minutes of continuous aerobic without signs/symptoms of physical distress      Intensity   THRR 40-80% of Max Heartrate 56-113    Ratings of Perceived Exertion 11-13    Perceived Dyspnea 0-4      Progression  Progression Continue progressive overload as per policy without signs/symptoms or physical distress.      Resistance Training   Training Prescription Yes    Weight orange band    Reps 10-15           Perform Capillary Blood Glucose checks as needed.  Exercise Prescription Changes:  Exercise Prescription Changes    Row Name 08/03/20 1200 08/17/20 1200 08/19/20 1603 09/07/20 1200 09/21/20 1500     Response to Exercise   Blood Pressure (Admit) 110/56 98/60 102/60 124/70 --   Blood Pressure (Exercise) 114/70 104/56 -- 124/70 --   Blood Pressure (Exit) 108/60 122/60 110/70 110/64 --   Heart Rate (Admit) 97 bpm 97 bpm 92 bpm 92 bpm --   Heart Rate (Exercise) 92 bpm 96 bpm 99 bpm 92 bpm --   Heart Rate (Exit) 91 bpm 93 bpm 96 bpm 93 bpm --   Oxygen Saturation (Admit) 92 % 94 % 97 % 98 % --   Oxygen Saturation (Exercise) 97 % 94 % 91 % 95 % --   Oxygen Saturation (Exit) 91 % 98 % 95 % 98 % --   Rating of Perceived Exertion (Exercise) '11 13 13 12 ' --   Perceived Dyspnea (Exercise) '2 2 3 3 ' --   Duration Progress to 30 minutes of  aerobic without signs/symptoms of physical distress Progress to 30 minutes of  aerobic without signs/symptoms of physical distress Continue with 30 min of aerobic exercise without signs/symptoms of physical distress. Continue with 30 min of aerobic exercise without signs/symptoms of physical distress. --   Intensity Other (comment) THRR unchanged THRR unchanged THRR unchanged --     Progression   Progression Continue to progress workloads to maintain intensity without  signs/symptoms of physical distress.  40-80% of HRR Continue to progress workloads to maintain intensity without signs/symptoms of physical distress. Continue to progress workloads to maintain intensity without signs/symptoms of physical distress. Continue to progress workloads to maintain intensity without signs/symptoms of physical distress. --   Average METs -- -- 1.8 1.8 --     Resistance Training   Training Prescription Yes Yes Yes Yes --   Weight orange bands orange bands orange bands orange bands --   Reps 10-15 10-15 10-15 10-15 --   Time 10 Minutes 10 Minutes 10 Minutes -- --     Oxygen   Oxygen Continuous Continuous Continuous Continuous --   Liters '3 3 3 3 ' --     NuStep   Level '1 1 2 2 ' --   SPM 80 80 80 80 --   Minutes '30 30 30 30 ' --   METs 1.4 1.8 1.8 1.8 --     Home Exercise Plan   Plans to continue exercise at -- -- -- -- Home (comment)  Resistance bands and recumbent bike   Frequency -- -- -- -- Add 2 additional days to program exercise sessions.   Initial Home Exercises Provided -- -- -- -- 09/21/20   Row Name 09/21/20 1600             Response to Exercise   Blood Pressure (Admit) 124/60       Blood Pressure (Exercise) 110/50       Blood Pressure (Exit) 124/72       Heart Rate (Admit) 88 bpm       Heart Rate (Exercise) 102 bpm       Heart Rate (Exit) 99 bpm       Oxygen Saturation (Admit) 98 %  Oxygen Saturation (Exercise) 85 %       Oxygen Saturation (Exit) 97 %       Rating of Perceived Exertion (Exercise) 12       Perceived Dyspnea (Exercise) 3       Symptoms --  oxygen saturation dropped to 85% while exercising       Comments --  Had to increase to 4L of oxygen while exercising       Duration Continue with 30 min of aerobic exercise without signs/symptoms of physical distress.       Intensity THRR unchanged         Progression   Progression Continue to progress workloads to maintain intensity without signs/symptoms of physical distress.        Average METs 2.1         Resistance Training   Training Prescription Yes       Weight Orange bands       Reps 10-15       Time 10 Minutes         Oxygen   Oxygen Continuous       Liters 3         NuStep   Level 2       SPM 80       Minutes 30       METs 2.1              Exercise Comments:  Exercise Comments    Row Name 08/03/20 1213 09/21/20 1214 09/21/20 1536       Exercise Comments pt completed first day of exercise with pulmonary rehab with no problems or concerns. He tolerated exercise well and has a positive outlook Pt's oxygen saturation dropped to 84 and 85% while exercising on the [place]. Pt states his SaO2 drops to high 70's when at home moving around. Increased his oxgyen to 4L and SaO2 maintained above 88% for the rest of the exercise session. Pt has office visit with Pulmonologist this week. Will f/u after MD visit. Discussed home exercise with pt. Pt is currently using his resistance bands at home 2 times per week. Encouraged pt to add at least one more day and to try his recumbent bike for about 5 minutes. Pt agreed. Will continue to follow and progress as able.            Exercise Goals and Review:  Exercise Goals    Row Name 07/27/20 1637             Exercise Goals   Increase Physical Activity Yes       Intervention Provide advice, education, support and counseling about physical activity/exercise needs.;Develop an individualized exercise prescription for aerobic and resistive training based on initial evaluation findings, risk stratification, comorbidities and participant's personal goals.       Expected Outcomes Short Term: Attend rehab on a regular basis to increase amount of physical activity.;Long Term: Add in home exercise to make exercise part of routine and to increase amount of physical activity.;Long Term: Exercising regularly at least 3-5 days a week.       Increase Strength and Stamina Yes       Intervention Provide advice, education, support  and counseling about physical activity/exercise needs.;Develop an individualized exercise prescription for aerobic and resistive training based on initial evaluation findings, risk stratification, comorbidities and participant's personal goals.       Expected Outcomes Short Term: Increase workloads from initial exercise prescription for resistance, speed, and METs.;Short Term:  Perform resistance training exercises routinely during rehab and add in resistance training at home;Long Term: Improve cardiorespiratory fitness, muscular endurance and strength as measured by increased METs and functional capacity (6MWT)       Able to understand and use rate of perceived exertion (RPE) scale Yes       Intervention Provide education and explanation on how to use RPE scale       Expected Outcomes Short Term: Able to use RPE daily in rehab to express subjective intensity level;Long Term:  Able to use RPE to guide intensity level when exercising independently       Able to understand and use Dyspnea scale Yes       Intervention Provide education and explanation on how to use Dyspnea scale       Expected Outcomes Short Term: Able to use Dyspnea scale daily in rehab to express subjective sense of shortness of breath during exertion;Long Term: Able to use Dyspnea scale to guide intensity level when exercising independently       Knowledge and understanding of Target Heart Rate Range (THRR) Yes       Intervention Provide education and explanation of THRR including how the numbers were predicted and where they are located for reference       Expected Outcomes Short Term: Able to state/look up THRR;Short Term: Able to use daily as guideline for intensity in rehab;Long Term: Able to use THRR to govern intensity when exercising independently       Understanding of Exercise Prescription Yes       Intervention Provide education, explanation, and written materials on patient's individual exercise prescription       Expected  Outcomes Short Term: Able to explain program exercise prescription;Long Term: Able to explain home exercise prescription to exercise independently              Exercise Goals Re-Evaluation :  Exercise Goals Re-Evaluation    Row Name 08/05/20 1546 08/24/20 0750 09/21/20 1608         Exercise Goal Re-Evaluation   Exercise Goals Review Increase Physical Activity;Increase Strength and Stamina;Able to understand and use rate of perceived exertion (RPE) scale;Able to understand and use Dyspnea scale;Knowledge and understanding of Target Heart Rate Range (THRR);Able to check pulse independently;Understanding of Exercise Prescription;Improve claudication pain tolerance and improve walking ability Increase Physical Activity;Increase Strength and Stamina;Able to understand and use rate of perceived exertion (RPE) scale;Able to understand and use Dyspnea scale;Knowledge and understanding of Target Heart Rate Range (THRR);Understanding of Exercise Prescription Increase Physical Activity;Increase Strength and Stamina;Able to understand and use rate of perceived exertion (RPE) scale;Able to understand and use Dyspnea scale;Knowledge and understanding of Target Heart Rate Range (THRR);Understanding of Exercise Prescription     Comments Pt has completed 2 exercise sessions. He is deconditioned and has only been able to do 25 minutes on the Nustep, instead of 30 minutes. During the resistance exercises he has to stop and take a couple of breaks due to shortness of break and fatigue. He seems to have a positive outlook and does not have any complaints. He is currently exercising at 1.6 METs on the Nustep Pt has completed 6 exercise sessions and is slow to make progressions. He has to take breaks during his 30 minutes on the Nustep due to SOB and muscle weakness. He has made progressions with the resistance bands and does not need to rest in between those exercises. He is exercising at 1.8 METS on the Nustep. We will  continue to monitor and progress as he is able. Pt has completed 14 exercise sessions and has been very slow to make progression. He has to take several rest breaks during his 30 minutes on the Nustep. He has made some progression in his METS but is still on level 2 on the Nustep. He is currently exercising at 2.1 METS. Will continue to monitor and progress as able.     Expected Outcomes Through exercise at rehab and home the patient will decrease shortness of breath with daily activities and feel confident in carrying out an exercise regimn at home Through exercise at rehab and home the patient will decrease shortness of breath with daily activities and feel confident in carrying out an exercise regimn at home Through exercise at rehab and home the patient will decrease shortness of breath with daily activities and feel confident in carrying out an exercise regimn at home            Discharge Exercise Prescription (Final Exercise Prescription Changes):  Exercise Prescription Changes - 09/21/20 1600      Response to Exercise   Blood Pressure (Admit) 124/60    Blood Pressure (Exercise) 110/50    Blood Pressure (Exit) 124/72    Heart Rate (Admit) 88 bpm    Heart Rate (Exercise) 102 bpm    Heart Rate (Exit) 99 bpm    Oxygen Saturation (Admit) 98 %    Oxygen Saturation (Exercise) 85 %    Oxygen Saturation (Exit) 97 %    Rating of Perceived Exertion (Exercise) 12    Perceived Dyspnea (Exercise) 3    Symptoms --   oxygen saturation dropped to 85% while exercising   Comments --   Had to increase to 4L of oxygen while exercising   Duration Continue with 30 min of aerobic exercise without signs/symptoms of physical distress.    Intensity THRR unchanged      Progression   Progression Continue to progress workloads to maintain intensity without signs/symptoms of physical distress.    Average METs 2.1      Resistance Training   Training Prescription Yes    Weight Orange bands    Reps 10-15     Time 10 Minutes      Oxygen   Oxygen Continuous    Liters 3      NuStep   Level 2    SPM 80    Minutes 30    METs 2.1           Nutrition:  Target Goals: Understanding of nutrition guidelines, daily intake of sodium <1547m, cholesterol <209m calories 30% from fat and 7% or less from saturated fats, daily to have 5 or more servings of fruits and vegetables.  Biometrics:    Nutrition Therapy Plan and Nutrition Goals:  Nutrition Therapy & Goals - 08/12/20 1209      Nutrition Therapy   Diet High cal/high protein      Personal Nutrition Goals   Nutrition Goal Pt to identify food quantities necessary to achieve weight gain of 0.25 lbs per week    Personal Goal #2 Pt to increase calories and protein to diet by incorporating peanut butter, avocados, whole milk, and instant breakfasts daily    Personal Goal #3 Homemade smoothies daily      Intervention Plan   Intervention Prescribe, educate and counsel regarding individualized specific dietary modifications aiming towards targeted core components such as weight, hypertension, lipid management, diabetes, heart failure and other comorbidities.;Nutrition handout(s) given to patient.  Expected Outcomes Short Term Goal: A plan has been developed with personal nutrition goals set during dietitian appointment.           Nutrition Assessments:  Nutrition Assessments - 08/10/20 0939      Rate Your Plate Scores   Pre Score 62           Nutrition Goals Re-Evaluation:  Nutrition Goals Re-Evaluation    Row Name 08/12/20 1209 09/23/20 0709           Goals   Current Weight 157 lb (71.2 kg) 160 lb 0.9 oz (72.6 kg)      Nutrition Goal -- Pt to identify food quantities necessary to achieve weight gain of 0.25 lbs per week        Personal Goal #2 Re-Evaluation   Personal Goal #2 -- Pt to increase calories and protein to diet by incorporating peanut butter, avocados, whole milk, and instant breakfasts daily        Personal  Goal #3 Re-Evaluation   Personal Goal #3 -- Homemade smoothies daily             Nutrition Goals Discharge (Final Nutrition Goals Re-Evaluation):  Nutrition Goals Re-Evaluation - 09/23/20 0709      Goals   Current Weight 160 lb 0.9 oz (72.6 kg)    Nutrition Goal Pt to identify food quantities necessary to achieve weight gain of 0.25 lbs per week      Personal Goal #2 Re-Evaluation   Personal Goal #2 Pt to increase calories and protein to diet by incorporating peanut butter, avocados, whole milk, and instant breakfasts daily      Personal Goal #3 Re-Evaluation   Personal Goal #3 Homemade smoothies daily           Psychosocial: Target Goals: Acknowledge presence or absence of significant depression and/or stress, maximize coping skills, provide positive support system. Participant is able to verbalize types and ability to use techniques and skills needed for reducing stress and depression.  Initial Review & Psychosocial Screening:  Initial Psych Review & Screening - 07/27/20 1455      Initial Review   Current issues with History of Depression;Current Sleep Concerns      Family Dynamics   Good Support System? Yes    Comments Has daughter in law sister comes twice a week to help out around the house.  Pt has two sons who live close by and are able and willing to assist. Pt on Celexa and family report that his mood has improved      Barriers   Psychosocial barriers to participate in program The patient should benefit from training in stress management and relaxation.      Screening Interventions   Interventions Encouraged to exercise           Quality of Life Scores:  Scores of 19 and below usually indicate a poorer quality of life in these areas.  A difference of  2-3 points is a clinically meaningful difference.  A difference of 2-3 points in the total score of the Quality of Life Index has been associated with significant improvement in overall quality of life,  self-image, physical symptoms, and general health in studies assessing change in quality of life.  PHQ-9: Recent Review Flowsheet Data    Depression screen [facility] 2/9 07/27/2020   Decreased Interest 0   Down, Depressed, Hopeless 0   PHQ - 2 Score 0   Altered sleeping 3   Tired, decreased energy 2   Change  in appetite 2   Feeling bad or failure about yourself  0   Trouble concentrating 0   Moving slowly or fidgety/restless 1   Suicidal thoughts 0   PHQ-9 Score 8   Difficult doing work/chores Somewhat difficult     Interpretation of Total Score  Total Score Depression Severity:  1-4 = Minimal depression, 5-9 = Mild depression, 10-14 = Moderate depression, 15-19 = Moderately severe depression, 20-27 = Severe depression   Psychosocial Evaluation and Intervention:  Psychosocial Evaluation - 08/23/20 1520      Psychosocial Evaluation & Interventions   Interventions Stress management education;Relaxation education;Encouraged to exercise with the program and follow exercise prescription    Comments Robert Dixon has history of depression.  Pt takes celexa and really doesn't make a difference but his family has noticied.  Pt with chronic sleep issues.  Hopefullly consistent home exercise will tire him so that can sleep through the night although he takes flomax and vists the bathroom a couple of times a night.    Expected Outcomes Robert Dixon will be free of any psychosocial barriers to participating in pulmonary rehab.    Continue Psychosocial Services  Follow up required by staff           Psychosocial Re-Evaluation:  Psychosocial Re-Evaluation    Dilley Name 08/04/20 1457 08/04/20 1632 08/23/20 1527 09/22/20 1618       Psychosocial Re-Evaluation   Current issues with History of Depression;Current Sleep Concerns -- History of Depression;Current Sleep Concerns History of Depression;Current Sleep Concerns    Comments Pt with improved management of his depresion through medication managament - Celexa.  Pt  family has noted that his modd has improved.  Pt has completed 1 exercise session and has a quirky dry sense of humor that can make assesment of his mental well being difficult..  Will contiue to probe further. Robert Dixon with improved management of his depresion through medication managament - Celexa.  Pt family has noted that his modd has improved.  Pt has completed 1 exercise session and has a quirky dry sense of humor that can make assesment of his mental well being difficult..  Will contiue to probe further. Robert Dixon with improved management of his depresion through medication managament - Celexa.  Pt family has noted that his modd has improved.  Pt has completed 1 exercise session and has a quirky dry sense of humor that can make assesment of his mental well being difficult..  Will contiue to probe further. Robert Dixon tries hard with exercise despite his orthopedic challenges.  Pt takes Celexa for his depression and although he hasn't endorse any improvement but his family says that his mood has changes.    Expected Outcomes Pt will endorse well managment of his depression through medication and increased physical activity. Robert Dixon  will endorse well managment of his depression through medication and increased physical activity. Robert Dixon  will endorse well managment of his depression through medication and increased physical activity. Robert Dixon will endorse that he feels the  managment of his depression through medication and increased physical activity is working.    Interventions Stress management education;Relaxation education;Encouraged to attend Pulmonary Rehabilitation for the exercise -- Stress management education;Relaxation education;Encouraged to attend Pulmonary Rehabilitation for the exercise Encouraged to attend Pulmonary Rehabilitation for the exercise    Continue Psychosocial Services  Follow up required by staff -- Follow up required by staff No Follow up required           Psychosocial Discharge (Final Psychosocial  Re-Evaluation):  Psychosocial  Re-Evaluation - 09/22/20 1618      Psychosocial Re-Evaluation   Current issues with History of Depression;Current Sleep Concerns    Comments Robert Dixon tries hard with exercise despite his orthopedic challenges.  Pt takes Celexa for his depression and although he hasn't endorse any improvement but his family says that his mood has changes.    Expected Outcomes Robert Dixon will endorse that he feels the  managment of his depression through medication and increased physical activity is working.    Interventions Encouraged to attend Pulmonary Rehabilitation for the exercise    Continue Psychosocial Services  No Follow up required           Education: Education Goals: Education classes will be provided on a weekly basis, covering required topics. Participant will state understanding/return demonstration of topics presented.  Learning Barriers/Preferences:  Learning Barriers/Preferences - 07/27/20 1459      Learning Barriers/Preferences   Learning Barriers Sight    Learning Preferences Audio;Computer/Internet;Group Instruction;Individual Instruction;Skilled Demonstration;Verbal Instruction;Video;Pictoral;Written Material           Education Topics: Risk Factor Reduction:  -Group instruction that is supported by a PowerPoint presentation. Instructor discusses the definition of a risk factor, different risk factors for pulmonary disease, and how the heart and lungs work together.     PULMONARY REHAB OTHER RESPIRATORY from 09/23/2020 in [place]  Date 09/23/20  Educator handout  Instruction Review Code 1- Verbalizes Understanding      Nutrition for Pulmonary Patient:  -Group instruction provided by PowerPoint slides, verbal discussion, and written materials to support subject matter. The instructor gives an explanation and review of healthy diet recommendations, which includes a discussion on weight management, recommendations for fruit  and vegetable consumption, as well as protein, fluid, caffeine, fiber, sodium, sugar, and alcohol. Tips for eating when patients are short of breath are discussed.   PULMONARY REHAB OTHER RESPIRATORY from 09/09/2020 in [place]  Date 08/12/20  Educator Meredith-H/O  Instruction Review Code 1- Verbalizes Understanding      Pursed Lip Breathing:  -Group instruction that is supported by demonstration and informational handouts. Instructor discusses the benefits of pursed lip and diaphragmatic breathing and detailed demonstration on how to preform both.     PULMONARY REHAB OTHER RESPIRATORY from 09/09/2020 in [place]  Date 09/09/20  Educator Handout      Oxygen Safety:  -Group instruction provided by PowerPoint, verbal discussion, and written material to support subject matter. There is an overview of "What is Oxygen" and "Why do we need it".  Instructor also reviews how to create a safe environment for oxygen use, the importance of using oxygen as prescribed, and the risks of noncompliance. There is a brief discussion on traveling with oxygen and resources the patient may utilize.   Oxygen Equipment:  -Group instruction provided by [facility] Staff utilizing handouts, written materials, and equipment demonstrations.   Signs and Symptoms:  -Group instruction provided by written material and verbal discussion to support subject matter. Warning signs and symptoms of infection, stroke, and heart attack are reviewed and when to call the physician/911 reinforced. Tips for preventing the spread of infection discussed.   Advanced Directives:  -Group instruction provided by verbal instruction and written material to support subject matter. Instructor reviews Advanced Directive laws and proper instruction for filling out document.   Pulmonary Video:  -Group video education that reviews the importance of medication and oxygen  compliance, exercise, good nutrition, pulmonary  hygiene, and pursed lip and diaphragmatic breathing for the pulmonary patient.   Exercise for the Pulmonary Patient:  -Group instruction that is supported by a PowerPoint presentation. Instructor discusses benefits of exercise, core components of exercise, frequency, duration, and intensity of an exercise routine, importance of utilizing pulse oximetry during exercise, safety while exercising, and options of places to exercise outside of rehab.     Pulmonary Medications:  -Verbally interactive group education provided by instructor with focus on inhaled medications and proper administration.   Anatomy and Physiology of the Respiratory System and Intimacy:  -Group instruction provided by PowerPoint, verbal discussion, and written material to support subject matter. Instructor reviews respiratory cycle and anatomical components of the respiratory system and their functions. Instructor also reviews differences in obstructive and restrictive respiratory diseases with examples of each. Intimacy, Sex, and Sexuality differences are reviewed with a discussion on how relationships can change when diagnosed with pulmonary disease. Common sexual concerns are reviewed.   PULMONARY REHAB OTHER RESPIRATORY from 09/09/2020 in [place]  Date 09/02/20  Educator Handout      MD DAY -A group question and answer session with a medical doctor that allows participants to ask questions that relate to their pulmonary disease state.   OTHER EDUCATION -Group or individual verbal, written, or video instructions that support the educational goals of the pulmonary rehab program.   PULMONARY REHAB OTHER RESPIRATORY from 09/23/2020 in [place]  Date 09/21/20  Jeannie Fend My Plate]  Educator Ailene Ravel H/O  Instruction Review Code 1- Verbalizes Understanding      Holiday Eating Survival Tips:  -Group  instruction provided by PowerPoint slides, verbal discussion, and written materials to support subject matter. The instructor gives patients tips, tricks, and techniques to help them not only survive but enjoy the holidays despite the onslaught of food that accompanies the holidays.   Knowledge Questionnaire Score:   Core Components/Risk Factors/Patient Goals at Admission:  Personal Goals and Risk Factors at Admission - 08/04/20 1501      Core Components/Risk Factors/Patient Goals on Admission    Weight Management Weight Gain           Core Components/Risk Factors/Patient Goals Review:   Goals and Risk Factor Review    Row Name 08/04/20 1502 08/23/20 1527 09/22/20 1622         Core Components/Risk Factors/Patient Goals Review   Personal Goals Review Increase knowledge of respiratory medications and ability to use respiratory devices properly.;Develop more efficient breathing techniques such as purse lipped breathing and diaphragmatic breathing and practicing self-pacing with activity.;Improve shortness of breath with ADL's;Weight Management/Obesity Increase knowledge of respiratory medications and ability to use respiratory devices properly.;Develop more efficient breathing techniques such as purse lipped breathing and diaphragmatic breathing and practicing self-pacing with activity.;Improve shortness of breath with ADL's;Weight Management/Obesity Increase knowledge of respiratory medications and ability to use respiratory devices properly.;Develop more efficient breathing techniques such as purse lipped breathing and diaphragmatic breathing and practicing self-pacing with activity.;Improve shortness of breath with ADL's;Weight Management/Obesity     Review Pt has completed 1 exercise session.  Pt has chronic issues with stomach and dizziness. Pt desires to gain weight.  Pt will meet with Ailene Ravel RD on techniques pt can use to stimulate his appetite and increase his caloric intake.  Will  continue to review with pt PLB although at times it is difficult to assess whether he understands due to his dry humor. Pt has completed 6 exercise session.  Pt has chronic issues with stomach and dizziness. Pt able to achieve 30 minutes of exercise on the stepper at level 2with frequent breaks and extended trip to the bathroom.  Pt desires to gain weight and has a  weight gain of 1 kg.   Will continue to review PLB and diaphragmatic breathing. This is difficult for this pt as he fatigues and tires very easily. Continue to work on pacing Pt has completed 14 exercise session. Pt able to achieve 30 minutes of exercise on the stepper at level 2with frequent breaks and extended trip to the bathroom.  Pt desires to gain weight and has a  weight gain of 1.3 kg. Robert Dixon has met with the RD on ways to increase his caloric intake.  Will continue to review PLB and diaphragmatic breathing. This is difficult for this pt as he fatigues and tires very easily. Continue to work on pacing     Expected Outcomes See Admission Goals See Admission Goals See Admission Goals            Core Components/Risk Factors/Patient Goals at Discharge (Final Review):   Goals and Risk Factor Review - 09/22/20 1622      Core Components/Risk Factors/Patient Goals Review   Personal Goals Review Increase knowledge of respiratory medications and ability to use respiratory devices properly.;Develop more efficient breathing techniques such as purse lipped breathing and diaphragmatic breathing and practicing self-pacing with activity.;Improve shortness of breath with ADL's;Weight Management/Obesity    Review Pt has completed 14 exercise session. Pt able to achieve 30 minutes of exercise on the stepper at level 2with frequent breaks and extended trip to the bathroom.  Pt desires to gain weight and has a  weight gain of 1.3 kg. Robert Dixon has met with the RD on ways to increase his caloric intake.  Will continue to review PLB and diaphragmatic breathing. This  is difficult for this pt as he fatigues and tires very easily. Continue to work on pacing    Expected Outcomes See Admission Goals           ITP Comments:   Comments:  Wille Glaser has completed 15 exercise session in Pulmonary rehab. Pt maintains good attendance and consistent home exercise. Pulmonary rehab staff will  continue to monitor and reassess progress toward goals during her participation in Pulmonary Rehab. Cherre Huger, BSN Cardiac and Training and development officer

## 2020-09-23 NOTE — Progress Notes (Signed)
Daily Session Note  Patient Details  Name: Robert Dixon MRN: [id number] Date of Birth: 06/17/1941 Referring Provider:     Pulmonary Rehab Walk Test from 07/27/2020 in [place]  Referring Provider [name]      Encounter Date: 09/23/2020  Check In:  Session Check In - 09/23/20 1211      Check-In   Supervising physician immediately available to respond to emergencies [facility] immediately available    Physician(s) Dr. Darrick Meigs    Location [facility]    Staff Present Rosebud Poles, RN, BSN;Carlette Wilber Oliphant, RN, BSN;Lisa Ysidro Evert, RN;Kaniesha Barile Hassell Done, MS, ACSM-CEP, Exercise Physiologist    Virtual Visit No    Medication changes reported     No    Fall or balance concerns reported    No    Tobacco Cessation No Change    Warm-up and Cool-down Performed on first and last piece of equipment    Resistance Training Performed Yes    VAD Patient? No    PAD/SET Patient? No      Pain Assessment   Currently in Pain? No/denies    Pain Score 0-No pain    Multiple Pain Sites No           Capillary Blood Glucose: No results found for this or any previous visit (from the past 24 hour(s)).    Social History   Tobacco Use  Smoking Status Former Smoker  . Years: 30.00  . Types: Cigarettes  . Quit date: 2000  . Years since quitting: 21.8  Smokeless Tobacco Never Used    Goals Met:  Proper associated with RPD/PD & O2 Sat Exercise tolerated well No report of cardiac concerns or symptoms Strength training completed today  Goals Unmet:  Not Applicable  Comments: Service time is from 1030 to 1115    [name] is Medical Director for Cardiac Rehab at [facility].

## 2020-09-24 ENCOUNTER — Ambulatory Visit (INDEPENDENT_AMBULATORY_CARE_PROVIDER_SITE_OTHER): Payer: Medicare HMO | Admitting: Pulmonary Disease

## 2020-09-24 ENCOUNTER — Other Ambulatory Visit: Payer: Self-pay

## 2020-09-24 ENCOUNTER — Encounter: Payer: Self-pay | Admitting: Pulmonary Disease

## 2020-09-24 VITALS — BP 120/60 | HR 92 | Temp 97.7°F | Ht 68.0 in | Wt 160.6 lb

## 2020-09-24 DIAGNOSIS — Z79899 Other long term (current) drug therapy: Secondary | ICD-10-CM

## 2020-09-24 DIAGNOSIS — J432 Centrilobular emphysema: Secondary | ICD-10-CM

## 2020-09-24 DIAGNOSIS — Z0000 Encounter for general adult medical examination without abnormal findings: Secondary | ICD-10-CM

## 2020-09-24 DIAGNOSIS — C3492 Malignant neoplasm of unspecified part of left bronchus or lung: Secondary | ICD-10-CM | POA: Diagnosis not present

## 2020-09-24 DIAGNOSIS — J9611 Chronic respiratory failure with hypoxia: Secondary | ICD-10-CM

## 2020-09-24 MED ORDER — TRELEGY ELLIPTA 100-62.5-25 MCG/INH IN AEPB
1.0000 | INHALATION_SPRAY | Freq: Every day | RESPIRATORY_TRACT | 0 refills | Status: DC
Start: 2020-09-24 — End: 2021-01-19

## 2020-09-24 NOTE — Assessment & Plan Note (Signed)
Plan: Samples of Trelegy Ellipta provided today

## 2020-09-24 NOTE — Assessment & Plan Note (Signed)
Plan: Continue oxygen therapy Walk today in office patient required 5 L continuous with physical exertion Continue to work with pulmonary rehab

## 2020-09-24 NOTE — Assessment & Plan Note (Signed)
Reviewed pulmonary function testing today with patient as well as family member Shows COPD Gold stage II, severe diffusion defect  Plan: Continue Trelegy Ellipta, samples provided today Continue pulmonary rehab Follow-up in 2 to 3 months

## 2020-09-24 NOTE — Progress Notes (Signed)
Robert Dixon thanks for seeing him in the office. Dr. 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 It looks like he is doing well since April 2021 when it was placed. He surely would have not survived this long with out your willingness to pursue it. Thanks, Lance Muss, DO [facility] 09/24/2020 2:36 PM

## 2020-09-24 NOTE — Assessment & Plan Note (Signed)
Plan: Continue follow-up with oncology

## 2020-09-24 NOTE — Progress Notes (Signed)
@Patient  ID: Robert Dixon, male    DOB: 1941-04-11, 79 y.o.   MRN: [id number]  Chief Complaint  Patient presents with  . Follow-up    COPD, Lung Ca, doing ok, on 4L of O2    Referring provider: [name], MD  HPI:  79 year old male former smoker followed in our office for non-small cell carcinoma of left lung, severe emphysema, chronic hypoxemic respiratory failure  PMH: None hypertension, acute kidney injury, hypokalemia, DNR status, status post TAVR, pericardial effusion, severe aortic stenosis, CAD Smoker/ Smoking History: Former smoker.  Quit 2000 Maintenance: Trelegy Ellipta 100 Pt of: [name]  09/24/2020  - Visit   79 year old male former smoker followed in our office for severe emphysema.  He is established with [name].  He is completing follow-up today from last office visit in July/2021.  At that office visit he was referred to pulmonary rehab as well as gastroenterology.  Patient is established with gastroenterology.  He was encouraged to remain on Trelegy Ellipta.  Chart review shows the patient has established and has been going to pulmonary rehab.  Patient presented to her office today as a 69-month follow-up.  Overall he reports he is doing okay.  He feels that he is at his baseline.  He has been working with pulmonary rehab.  He recently increased his oxygen to 4 L O2 with physical exertion.  He reports that he feels better on the settings.  He has not tested his oxygen to ensure this is adequately treating his hypoxemia.  He would like to review his pulmonary function testing again.  He is unsure what stage of COPD he is on.  He remains adherent to Trelegy Ellipta but unfortunately is in the donut hole he is wondering if we have any samples of this.  He is up-to-date with his COVID-19 vaccinations as well as flu vaccine.  He reports that he is finishing up pulmonary rehab next month.  He has found that this is helpful.  Patient was walked in office today he  was able to complete about 100 feet of walking.  He did require 5 L continuous with physical exertion.   Questionaires / Pulmonary Flowsheets:   ACT:  No flowsheet data found.  MMRC: mMRC Dyspnea Scale mMRC Score  09/24/2020 3  06/21/2020 3    Epworth:  No flowsheet data found.  Tests:   FENO:  No results found for: NITRICOXIDE  PFT: PFT Results Latest Ref Rng & Units 06/21/2020  FVC-Pre L 2.75  FVC-Predicted Pre % 80  FVC-Post L 2.86  FVC-Predicted Post % 83  Pre FEV1/FVC % % 54  Post FEV1/FCV % % 50  FEV1-Pre L 1.49  FEV1-Predicted Pre % 61  FEV1-Post L 1.44  DLCO uncorrected ml/min/mmHg 4.58  DLCO UNC% % 21  DLCO corrected ml/min/mmHg 4.58  DLCO COR %Predicted % 21  DLVA Predicted % 29  TLC L 5.37  TLC % Predicted % 85  RV % Predicted % 110    WALK:  SIX MIN WALK 07/27/2020 06/21/2020 04/15/2019  Supplimental Oxygen during Test? (L/min) - Yes No  O2 Flow Rate - 3 -  Type - Pulse -  2 Minute Oxygen Saturation % 92 - -  2 Minute HR 104 - -  4 Minute Oxygen Saturation % 90 - -  4 Minute HR 99 - -  6 Minute Oxygen Saturation % 99 - -  6 Minute HR 91 - -  Tech Comments: - moderate paced walk for  1 lap and maintained O2 sat above 90% on current settings pt walked average pace one lap had to stop due to increase SOB and O2 sats dropped to 84%.kmw    Imaging: CT Abdomen Pelvis W Contrast  Result Date: 09/03/2020 CLINICAL DATA:  Generalized abdominal pain and anorexia. Personal history of lung carcinoma. EXAM: CT ABDOMEN AND PELVIS WITH CONTRAST TECHNIQUE: Multidetector CT imaging of the abdomen and pelvis was performed using the standard protocol following bolus administration of intravenous contrast. CONTRAST:  11mL OMNIPAQUE IOHEXOL 300 MG/ML  SOLN COMPARISON:  Chest CT on 06/28/2020 FINDINGS: Lower Chest: Stable moderate left pleural effusion and left lower lobe airspace disease. Stable moderate pericardial effusion and tiny right pleural effusion versus pleural  thickening. Hepatobiliary: No hepatic masses identified. Several small hepatic cysts are noted. Gallbladder is unremarkable. No evidence of biliary ductal dilatation. Pancreas:  No mass or inflammatory changes. Spleen: Within normal limits in size and appearance. Adrenals/Urinary Tract: Multiple bilateral renal cysts are seen including a small hemorrhagic cyst in the upper pole of the left kidney. No masses identified. No evidence of ureteral calculi or hydronephrosis. Stomach/Bowel: No evidence of obstruction, inflammatory process or abnormal fluid collections. Diverticulosis is seen mainly involving the descending and sigmoid colon, however there is no evidence of diverticulitis. Vascular/Lymphatic: No pathologically enlarged lymph nodes. No abdominal aortic aneurysm. Aortic atherosclerotic calcification noted. Reproductive:  Mildly enlarged prostate. Other:  None. Musculoskeletal:  No suspicious bone lesions identified. IMPRESSION: No acute findings within the abdomen or pelvis. No evidence of metastatic disease. Colonic diverticulosis, without radiographic evidence of diverticulitis. Mildly enlarged prostate. Stable moderate left pleural effusion and pericardial effusion. Aortic Atherosclerosis (ICD10-I70.0). Electronically Signed   By: Marlaine Hind M.D.   On: 09/03/2020 09:22    Lab Results:  CBC    Component Value Date/Time   WBC 5.5 06/28/2020 0949   WBC 8.1 03/28/2020 0840   RBC 2.71 (L) 06/28/2020 0949   HGB 10.0 (L) 06/28/2020 0949   HCT 30.7 (L) 06/28/2020 0949   PLT 196 06/28/2020 0949   MCV 113.3 (H) 06/28/2020 0949   MCH 36.9 (H) 06/28/2020 0949   MCHC 32.6 06/28/2020 0949   RDW 14.8 06/28/2020 0949   LYMPHSABS 0.6 (L) 06/28/2020 0949   MONOABS 0.6 06/28/2020 0949   EOSABS 0.1 06/28/2020 0949   BASOSABS 0.1 06/28/2020 0949    BMET    Component Value Date/Time   NA 141 06/28/2020 0949   K 4.2 06/28/2020 0949   CL 110 06/28/2020 0949   CO2 24 06/28/2020 0949   GLUCOSE 84  06/28/2020 0949   BUN 22 08/25/2020 1140   CREATININE 1.20 (H) 08/25/2020 1140   CALCIUM 9.3 06/28/2020 0949   GFRNONAA 57 (L) 08/25/2020 1140   GFRAA 66 08/25/2020 1140    BNP    Component Value Date/Time   BNP 910.9 (H) 03/16/2020 0236    ProBNP No results found for: PROBNP  Specialty Problems      Pulmonary Problems   CAP (community acquired pneumonia)   Acute respiratory failure ([place])   Hilar mass   Primary malignant neoplasm of left upper lobe of lung (HCC)   Non-small cell carcinoma of left lung, stage 3 (HCC)   Centrilobular emphysema (HCC)   Chronic respiratory failure with hypoxia (HCC)   Pleural effusion   COPD exacerbation (HCC)   Acute on chronic respiratory failure with hypoxia (HCC)      No Known Allergies  Immunization History  Administered Date(s) Administered  . Fluad Quad(high  Dose 65+) 08/12/2019  . Influenza, High Dose Seasonal PF 09/04/2018  . Influenza-Unspecified 07/30/2020  . PFIZER SARS-COV-2 Vaccination 01/18/2020, 02/11/2020  . Pneumococcal Polysaccharide-23 01/30/2011, 05/03/2020  . Tdap 02/21/2010   Received covid booster last week   Past Medical History:  Diagnosis Date  . Acute on chronic respiratory failure with hypoxia ([place]) 03/16/2020  . Acute respiratory failure ([place]) 04/09/2013  . Carotid artery disease ([place]) 03/28/2020   Carotid US 02/2020:  R 100%; L 1-39%  . Chronic systolic CHF 69/67/8938   Echo 02/2020: EF 30-35, small pericardial effusion, trivial MR, status post TAVR with no PVL, mean gradient 7 mmHg  . Complication of anesthesia   . COPD (chronic obstructive pulmonary disease) ([place])   . GERD (gastroesophageal reflux disease)   . Hemoptysis 11/20/2018  . History of hiatal hernia   . Hypertension   . 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 Mediastinal adenopathy 12/06/2018  . Migraine with visual aura   . Non-small cell carcinoma of left lung, stage 3 ([place]) 12/19/2018  . Non-small cell lung cancer ([place]) dx'd 11/20/18  .  Pericardial effusion 10/14/2019  . Pneumonia 11/20/2018  . PONV (postoperative nausea and vomiting)   . Primary malignant neoplasm of left upper lobe of lung ([place]) 12/12/2018  . Pulmonary nodule 11/20/2018  . 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 Severe aortic stenosis 10/14/2019  . Skin cancer     Tobacco History: Social History   Tobacco Use  Smoking Status Former Smoker  . Years: 30.00  . Types: Cigarettes  . Quit date: 2000  . Years since quitting: 21.8  Smokeless Tobacco Never Used   Counseling given: Not Answered   Continue to not smoke  Outpatient Encounter Medications as of 09/24/2020  Medication Sig  . acetaminophen (TYLENOL) 325 MG tablet Take 2 tablets (650 mg total) by mouth every 6 (six) hours as needed for mild pain (or Fever >/= 101).  Marland Kitchen albuterol (PROVENTIL) (2.5 MG/3ML) 0.083% nebulizer solution Take 3 mLs (2.5 mg total) by nebulization every 4 (four) hours.  Marland Kitchen albuterol (VENTOLIN HFA) 108 (90 Base) MCG/ACT inhaler Inhale 2 puffs into the lungs every 6 (six) hours as needed for wheezing or shortness of breath.  Marland Kitchen alum & mag hydroxide-simeth (MAALOX/MYLANTA) 200-200-20 MG/5ML suspension Take 15 mLs by mouth every 6 (six) hours as needed for indigestion or heartburn.  Marland Kitchen ascorbic acid (VITAMIN C) 500 MG tablet Take 500 mg by mouth daily.  Marland Kitchen aspirin 81 MG chewable tablet Chew 1 tablet (81 mg total) by mouth daily.  Marland Kitchen atorvastatin (LIPITOR) 40 MG tablet Take 1 tablet (40 mg total) by mouth daily.  . citalopram (CELEXA) 10 MG tablet Take 10 mg by mouth daily.  . Cyanocobalamin (VITAMIN B 12 PO) Take 1 capsule by mouth daily. 1043mcg 3 times weekly  . Fluticasone-Umeclidin-Vilant (TRELEGY ELLIPTA) 100-62.5-25 MCG/INH AEPB Inhale 1 puff into the lungs daily.  . furosemide (LASIX) 20 MG tablet Take 1 tablet (20 mg total) by mouth daily.  . metoprolol succinate (TOPROL-XL) 25 MG 24 hr tablet Take 0.5 tablets (12.5 mg total) by mouth daily.  .  Multiple Vitamin (MULTIVITAMIN) tablet Take 1 tablet by mouth daily.  . pantoprazole (PROTONIX) 40 MG tablet Take 40 mg by mouth 2 (two) times daily.  . polycarbophil (FIBERCON) 625 MG tablet Take 625 mg by mouth 2 (two) times daily.   . tamsulosin (FLOMAX) 0.4 MG CAPS capsule Take 0.4 mg by mouth daily.   . TRELEGY ELLIPTA 100-62.5-25 MCG/INH AEPB  Inhale 1 puff into the lungs daily.  . [DISCONTINUED] clopidogrel (PLAVIX) 75 MG tablet Take 1 tablet (75 mg total) by mouth daily with breakfast.   Facility-Administered Encounter Medications as of 09/24/2020  Medication  . sodium chloride flush (NS) 0.9 % injection 10 mL  . sodium chloride flush (NS) 0.9 % injection 10 mL     Review of Systems  Review of Systems  Constitutional: Positive for fatigue. Negative for activity change, chills, fever and unexpected weight change.  HENT: Positive for congestion (clear). Negative for postnasal drip, rhinorrhea, sinus pressure, sinus pain and sore throat.   Eyes: Negative.   Respiratory: Positive for shortness of breath. Negative for cough and wheezing.   Cardiovascular: Negative for chest pain and palpitations.  Gastrointestinal: Negative for constipation, diarrhea, nausea and vomiting.  Endocrine: Negative.   Genitourinary: Negative.   Musculoskeletal: Negative.   Skin: Negative.   Neurological: Negative for dizziness and headaches.  Psychiatric/Behavioral: Negative.  Negative for dysphoric mood. The patient is not nervous/anxious.   All other systems reviewed and are negative.    Physical Exam  BP 120/60 (BP Location: Left Arm, Cuff Size: Normal)   Pulse 92   Temp 97.7 F (36.5 C) (Oral)   Ht 5\' 8"  (1.727 m)   Wt 160 lb 9.6 oz (72.8 kg)   SpO2 98%   BMI 24.42 kg/m   Wt Readings from Last 5 Encounters:  09/24/20 160 lb 9.6 oz (72.8 kg)  09/07/20 156 lb 12 oz (71.1 kg)  08/25/20 158 lb 4 oz (71.8 kg)  08/17/20 156 lb 15.5 oz (71.2 kg)  08/03/20 157 lb 3 oz (71.3 kg)    BMI  Readings from Last 5 Encounters:  09/24/20 24.42 kg/m  09/07/20 23.83 kg/m  08/25/20 24.06 kg/m  08/17/20 25.34 kg/m  08/03/20 25.37 kg/m     Physical Exam Vitals and nursing note reviewed.  Constitutional:      General: He is not in acute distress.    Appearance: Normal appearance. He is normal weight.  HENT:     Head: Normocephalic and atraumatic.     Right Ear: Hearing and external ear normal.     Left Ear: Hearing and external ear normal.     Nose: No mucosal edema.     Right Turbinates: Not enlarged.     Left Turbinates: Not enlarged.     Mouth/Throat:     Mouth: Mucous membranes are dry.     Pharynx: Oropharynx is clear. No oropharyngeal exudate.  Eyes:     Pupils: Pupils are equal, round, and reactive to light.  Cardiovascular:     Rate and Rhythm: Normal rate and regular rhythm.     Pulses: Normal pulses.     Heart sounds: Normal heart sounds. No murmur heard.   Pulmonary:     Effort: Pulmonary effort is normal.     Breath sounds: No decreased breath sounds, wheezing or rales.     Comments: Diminished breath sounds throughout exam Musculoskeletal:     Cervical back: Normal range of motion.     Right lower leg: No edema.     Left lower leg: No edema.  Lymphadenopathy:     Cervical: No cervical adenopathy.  Skin:    General: Skin is warm and dry.     Capillary Refill: Capillary refill takes less than 2 seconds.     Findings: No erythema or rash.  Neurological:     General: No focal deficit present.     Mental Status:  He is alert and oriented to person, place, and time.     Motor: No weakness.     Coordination: Coordination normal.     Gait: Gait abnormal (Able to tolerate walk in office, unsteady gait).  Psychiatric:        Mood and Affect: Mood normal.        Behavior: Behavior normal. Behavior is cooperative.        Thought Content: Thought content normal.        Judgment: Judgment normal.       Assessment & Plan:   Non-small cell carcinoma  of left lung, stage 3 (HCC) Plan: Continue follow-up with oncology  Chronic respiratory failure with hypoxia (HCC) Plan: Continue oxygen therapy Walk today in office patient required 5 L continuous with physical exertion Continue to work with pulmonary rehab   Centrilobular emphysema ([place]) Reviewed pulmonary function testing today with patient as well as family member Shows COPD Gold stage II, severe diffusion defect  Plan: Continue Trelegy Ellipta, samples provided today Continue pulmonary rehab Follow-up in 2 to 3 months   Medication management Plan: Samples of Trelegy Ellipta provided today  Healthcare maintenance Up-to-date with COVID-19 vaccinations and flu vaccine    Return in about 3 months (around 12/25/2020), or if symptoms worsen or fail to improve, for Follow up with [name].   Lauraine Rinne, NP 09/24/2020   This appointment required 32 minutes of patient care (this includes precharting, chart review, review of results, face-to-face care, etc.).

## 2020-09-24 NOTE — Assessment & Plan Note (Signed)
Up-to-date with COVID-19 vaccinations and flu vaccine

## 2020-09-24 NOTE — Addendum Note (Signed)
Addended by: Coralie Keens on: 09/24/2020 03:24 PM   Modules accepted: Orders

## 2020-09-24 NOTE — Patient Instructions (Addendum)
You were seen today by Lauraine Rinne, NP  for:   1. Centrilobular emphysema ([place])  Your last breathing test showed COPD Gold stage II You do have severe emphysema  Continue to work in pulmonary rehab  Trelegy Ellipta  >>> 1 puff daily in the morning >>>rinse mouth out after use  >>> This inhaler contains 3 medications that help manage her respiratory status, contact our office if you cannot afford this medication or unable to remain on this medication  Note your daily symptoms > remember "red flags" for COPD:   >>>Increase in cough >>>increase in sputum production >>>increase in shortness of breath or activity  intolerance.   If you notice these symptoms, please call the office to be seen.   2. Chronic respiratory failure with hypoxia (HCC)  Walk today in office >>> He required 5-6 L continuous with physical exertion >>>When using your POC please use 5 L pulsed  Continue oxygen therapy as prescribed  >>>maintain oxygen saturations greater than 88 percent  >>>if unable to maintain oxygen saturations please contact the office  >>>do not smoke with oxygen  >>>can use nasal saline gel or nasal saline rinses to moisturize nose if oxygen causes dryness  3. Medication management  We will provide you Trelegy Ellipta samples today  4. Non-small cell carcinoma of left lung, stage 3 (HCC)  Continue follow-up with oncology  5. Healthcare maintenance  Up-to-date on flu vaccine  Up-to-date on COVID-19 vaccinations    Follow Up:    Return in about 3 months (around 12/25/2020), or if symptoms worsen or fail to improve, for Follow up with [name].   Notification of test results are managed in the following manner: If there are  any recommendations or changes to the  plan of care discussed in office today,  we will contact you and let you know what they are. If you do not hear from [place], then your results are normal and you can view them through your  MyChart account , or a letter  will be sent to you. Thank you again for trusting [place] with your care  - Thank you, [facility]    It is flu season:   >>> Best ways to protect herself from the flu: Receive the yearly flu vaccine, practice good hand hygiene washing with soap and also using hand sanitizer when available, eat a nutritious meals, get adequate rest, hydrate appropriately       Please contact the office if your symptoms worsen or you have concerns that you are not improving.   Thank you for choosing [facility] for your healthcare, and for allowing [place] to partner with you on your healthcare journey. I am thankful to be able to provide care to you today.   Wyn Quaker FNP-C    COPD and Physical Activity Chronic obstructive pulmonary disease (COPD) is a long-term (chronic) condition that affects the lungs. COPD is a general term that can be used to describe many different lung problems that cause lung swelling (inflammation) and limit airflow, including chronic bronchitis and emphysema. The main symptom of COPD is shortness of breath, which makes it harder to do even simple tasks. This can also make it harder to exercise and be active. Talk with your health care provider about treatments to help you breathe better and actions you can take to prevent breathing problems during physical activity. What are the benefits of exercising with COPD? Exercising regularly is an important part of a healthy lifestyle. You  can still exercise and do physical activities even though you have COPD. Exercise and physical activity improve your shortness of breath by increasing blood flow (circulation). This causes your heart to pump more oxygen through your body. Moderate exercise can improve your:  Oxygen use.  Energy level.  Shortness of breath.  Strength in your breathing muscles.  Heart health.  Sleep.  Self-esteem and feelings of self-worth.  Depression, stress, and anxiety levels. Exercise can  benefit everyone with COPD. The severity of your disease may affect how hard you can exercise, especially at first, but everyone can benefit. Talk with your health care provider about how much exercise is safe for you, and which activities and exercises are safe for you. What actions can I take to prevent breathing problems during physical activity?  Sign up for a pulmonary rehabilitation program. This type of program may include: ? Education about lung diseases. ? Exercise classes that teach you how to exercise and be more active while improving your breathing. This usually involves:  Exercise using your lower extremities, such as a stationary bicycle.  About 30 minutes of exercise, 2 to 5 times per week, for 6 to 12 weeks  Strength training, such as push ups or leg lifts. ? Nutrition education. ? Group classes in which you can talk with others who also have COPD and learn ways to manage stress.  If you use an oxygen tank, you should use it while you exercise. Work with your health care provider to adjust your oxygen for your physical activity. Your resting flow rate is different from your flow rate during physical activity.  While you are exercising: ? Take slow breaths. ? Pace yourself and do not try to go too fast. ? Purse your lips while breathing out. Pursing your lips is similar to a kissing or whistling position. ? If doing exercise that uses a quick burst of effort, such as weight lifting:  Breathe in before starting the exercise.  Breathe out during the hardest part of the exercise (such as raising the weights). Where to find support You can find support for exercising with COPD from:  Your health care provider.  A pulmonary rehabilitation program.  Your local health department or community health programs.  Support groups, online or in-person. Your health care provider may be able to recommend support groups. Where to find more information You can find more information  about exercising with COPD from:  American Lung Association: ClassInsider.se.  COPD Foundation: https://www.rivera.net/. Contact a health care provider if:  Your symptoms get worse.  You have chest pain.  You have nausea.  You have a fever.  You have trouble talking or catching your breath.  You want to start a new exercise program or a new activity. Summary  COPD is a general term that can be used to describe many different lung problems that cause lung swelling (inflammation) and limit airflow. This includes chronic bronchitis and emphysema.  Exercise and physical activity improve your shortness of breath by increasing blood flow (circulation). This causes your heart to provide more oxygen to your body.  Contact your health care provider before starting any exercise program or new activity. Ask your health care provider what exercises and activities are safe for you. This information is not intended to replace advice given to you by your health care provider. Make sure you discuss any questions you have with your health care provider. Document Revised: 03/05/2019 Document Reviewed: 12/06/2017 Elsevier Patient Education  2020 [employer].

## 2020-09-26 ENCOUNTER — Encounter: Payer: Self-pay | Admitting: Internal Medicine

## 2020-09-28 ENCOUNTER — Telehealth: Payer: Self-pay | Admitting: Internal Medicine

## 2020-09-28 ENCOUNTER — Other Ambulatory Visit: Payer: Self-pay

## 2020-09-28 ENCOUNTER — Encounter (HOSPITAL_COMMUNITY)
Admission: RE | Admit: 2020-09-28 | Discharge: 2020-09-28 | Disposition: A | Discharge status: Home / Self Care | Payer: Medicare HMO | Source: Ambulatory Visit | Attending: Pulmonary Disease | Admitting: Pulmonary Disease

## 2020-09-28 VITALS — Wt 160.7 lb

## 2020-09-28 DIAGNOSIS — J432 Centrilobular emphysema: Secondary | ICD-10-CM | POA: Diagnosis not present

## 2020-09-28 DIAGNOSIS — J439 Emphysema, unspecified: Secondary | ICD-10-CM | POA: Diagnosis not present

## 2020-09-28 NOTE — Progress Notes (Signed)
Daily Session Note  Patient Details  Name: Robert Dixon MRN: [id number] Date of Birth: 07/24/1941 Referring Provider:     Pulmonary Rehab Walk Test from 07/27/2020 in [place]  Referring Provider [name]      Encounter Date: 09/28/2020  Check In:  Session Check In - 09/28/20 1104      Check-In   Supervising physician immediately available to respond to emergencies [facility] immediately available    Physician(s) Dr.  Darrick Meigs    Location [facility]    Staff Present Rosebud Poles, RN, BSN;Lisa Ysidro Evert, RN;Ajahnae Rathgeber Hassell Done, MS, ACSM-CEP, Exercise Physiologist    Virtual Visit No    Medication changes reported     No    Fall or balance concerns reported    No    Tobacco Cessation No Change    Warm-up and Cool-down Performed as group-led instruction    Resistance Training Performed Yes    VAD Patient? No    PAD/SET Patient? No      Pain Assessment   Currently in Pain? No/denies    Multiple Pain Sites No           Capillary Blood Glucose: No results found for this or any previous visit (from the past 24 hour(s)).   Exercise Prescription Changes - 09/28/20 1100      Response to Exercise   Blood Pressure (Admit) 120/68    Blood Pressure (Exercise) 122/60    Blood Pressure (Exit) 122/70    Heart Rate (Admit) 91 bpm    Heart Rate (Exercise) 95 bpm    Heart Rate (Exit) 93 bpm    Oxygen Saturation (Admit) 98 %    Oxygen Saturation (Exercise) 97 %    Oxygen Saturation (Exit) 99 %    Rating of Perceived Exertion (Exercise) 12    Perceived Dyspnea (Exercise) 3    Duration Continue with 30 min of aerobic exercise without signs/symptoms of physical distress.    Intensity THRR unchanged      Progression   Progression Continue to progress workloads to maintain intensity without signs/symptoms of physical distress.      Resistance Training   Training Prescription Yes    Weight orange bands    Reps 10-15    Time 10  Minutes      Oxygen   Oxygen Continuous    Liters 4      NuStep   Level 2    SPM 80    Minutes 30    METs 2.2           Social History   Tobacco Use  Smoking Status Former Smoker  . Years: 30.00  . Types: Cigarettes  . Quit date: 2000  . Years since quitting: 21.8  Smokeless Tobacco Never Used    Goals Met:  Proper associated with RPD/PD & O2 Sat Exercise tolerated well No report of cardiac concerns or symptoms Strength training completed today  Goals Unmet:  Not Applicable  Comments: Service time is from 1030 to 1135    [name] is Medical Director for Cardiac Rehab at [facility].

## 2020-09-28 NOTE — Telephone Encounter (Signed)
Scheduled appt per 11/1 schmsg - pt is aware of appt date and time

## 2020-09-30 ENCOUNTER — Other Ambulatory Visit: Payer: Self-pay

## 2020-09-30 ENCOUNTER — Encounter (HOSPITAL_COMMUNITY)
Admission: RE | Admit: 2020-09-30 | Discharge: 2020-09-30 | Disposition: A | Discharge status: Home / Self Care | Payer: Medicare HMO | Source: Ambulatory Visit | Attending: Pulmonary Disease | Admitting: Pulmonary Disease

## 2020-09-30 DIAGNOSIS — J432 Centrilobular emphysema: Secondary | ICD-10-CM

## 2020-09-30 NOTE — Progress Notes (Signed)
Daily Session Note  Patient Details  Name: Robert Dixon MRN: [id number] Date of Birth: 10/07/1941 Referring Provider:     Pulmonary Rehab Walk Test from 07/27/2020 in [facility]  Referring Provider Dr. Icard      Encounter Date: 09/30/2020  Check In:  Session Check In - 09/30/20 1110      Check-In   Supervising physician immediately available to respond to emergencies [facility] immediately available    Physician(s) Dr. Samtani    Location [facility]    Staff Present Joan Behrens, RN, BSN;Lisa Hughes, RN;Jessica Martin, MS, ACSM-CEP, Exercise Physiologist    Virtual Visit No    Medication changes reported     No    Fall or balance concerns reported    No    Tobacco Cessation No Change    Warm-up and Cool-down Not performed (comment)    Resistance Training Performed No    VAD Patient? No    PAD/SET Patient? No      Pain Assessment   Currently in Pain? No/denies    Multiple Pain Sites No           Capillary Blood Glucose: No results found for this or any previous visit (from the past 24 hour(s)).    Social History   Tobacco Use  Smoking Status Former Smoker  . Years: 30.00  . Types: Cigarettes  . Quit date: 2000  . Years since quitting: 21.8  Smokeless Tobacco Never Used    Goals Met:  Proper associated with RPD/PD & O2 Sat Exercise tolerated well No report of cardiac concerns or symptoms  Goals Unmet:  Not Applicable  Comments: Service time is from 1030 to 1110 Completed post 6 minute walk test today and graduated from undergrad pulmonary rehab.    Dr. Traci Turner is Medical Director for Cardiac Rehab at [facility]. 

## 2020-10-01 DIAGNOSIS — C3412 Malignant neoplasm of upper lobe, left bronchus or lung: Secondary | ICD-10-CM | POA: Diagnosis not present

## 2020-10-01 DIAGNOSIS — J9611 Chronic respiratory failure with hypoxia: Secondary | ICD-10-CM | POA: Diagnosis not present

## 2020-10-01 DIAGNOSIS — J439 Emphysema, unspecified: Secondary | ICD-10-CM | POA: Diagnosis not present

## 2020-10-01 DIAGNOSIS — J90 Pleural effusion, not elsewhere classified: Secondary | ICD-10-CM | POA: Diagnosis not present

## 2020-10-01 DIAGNOSIS — J9600 Acute respiratory failure, unspecified whether with hypoxia or hypercapnia: Secondary | ICD-10-CM | POA: Diagnosis not present

## 2020-10-01 DIAGNOSIS — J432 Centrilobular emphysema: Secondary | ICD-10-CM | POA: Diagnosis not present

## 2020-10-01 DIAGNOSIS — C3492 Malignant neoplasm of unspecified part of left bronchus or lung: Secondary | ICD-10-CM | POA: Diagnosis not present

## 2020-10-11 NOTE — Progress Notes (Signed)
Discharge Progress Report  Patient Details  Name: Robert Dixon MRN: [id number] Date of Birth: August 17, 1941 Referring Provider:     Pulmonary Rehab Walk Test from 07/27/2020 in [place]  Referring Provider [name]       Number of Visits: 17  Reason for Discharge:  Patient has met program and personal goals.  Smoking History:  Social History   Tobacco Use  Smoking Status Former Smoker  . Years: 30.00  . Types: Cigarettes  . Quit date: 2000  . 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      Oxygen   Oxygen Continuous    Liters 3      NuStep   Level 1    SPM 80    Minutes 30      Prescription Details   Frequency (times per week) 2    Duration Progress to 30 minutes of continuous aerobic without signs/symptoms of physical distress      Intensity   THRR 40-80% of Max Heartrate 56-113    Ratings of Perceived Exertion 11-13    Perceived Dyspnea 0-4      Progression   Progression Continue progressive overload as per policy without signs/symptoms or physical distress.      Resistance Training   Training Prescription Yes    Weight orange band    Reps 10-15           Discharge Exercise Prescription (Final Exercise Prescription Changes):  Exercise Prescription Changes - 09/28/20 1100      Response to Exercise   Blood Pressure (Admit) 120/68    Blood Pressure (Exercise) 122/60    Blood Pressure (Exit) 122/70    Heart Rate (Admit) 91  bpm    Heart Rate (Exercise) 95 bpm    Heart Rate (Exit) 93 bpm    Oxygen Saturation (Admit) 98 %    Oxygen Saturation (Exercise) 97 %    Oxygen Saturation (Exit) 99 %    Rating of Perceived Exertion (Exercise) 12    Perceived Dyspnea (Exercise) 3    Duration Continue with 30 min of aerobic exercise without signs/symptoms of physical distress.    Intensity THRR unchanged      Progression   Progression Continue to progress workloads to maintain intensity without signs/symptoms of physical distress.      Resistance Training   Training Prescription Yes    Weight orange bands    Reps 10-15    Time 10 Minutes      Oxygen   Oxygen Continuous    Liters 4      NuStep   Level 2    SPM 80    Minutes 30    METs 2.2           Functional Capacity:  6 Minute Walk  [place] Name 07/27/20 1628 09/30/20 1157       6 Minute Walk   Phase Initial Discharge    Distance 434 feet 474 feet    Distance % Change - 9.22 %    Distance Feet Change - 40 ft    Walk Time 6 minutes 6 minutes    # of Rest Breaks 0 1    MPH 0.82 0.89    METS 1.03 1.09    RPE 12 13    Perceived Dyspnea  2 3    VO2 Peak 3.61 3.83    Symptoms Yes (comment) Yes (comment)  SOB    Comments Sat for the final 2 min 20 sec of test due to SOB Pt had to sit down at minute 4 due to SOB and recovered for the remainder of the walk test    Resting HR 95 bpm 89 bpm    Resting BP 110/60 120/58    Resting Oxygen Saturation  100 % 100 %    Exercise Oxygen Saturation  during 6 min walk 90 % 85 %    Max Ex. HR 105 bpm 108 bpm    Max Ex. BP 126/70 128/68    2 Minute Post BP 104/62 120/60      Interval HR   1 Minute HR 102 107    2 Minute HR 104 103    3 Minute HR 105 101    4 Minute HR 99 104    5 Minute HR 93 108    6 Minute HR 91 94    2 Minute Post HR 91 91    Interval Heart Rate? Yes Yes      Interval Oxygen   Interval Oxygen? Yes Yes    Baseline Oxygen Saturation % 100 % 100 %    1 Minute Oxygen Saturation % 95 %  94 %    1 Minute Liters of Oxygen 3 L 4 L    2 Minute Oxygen Saturation % 92 % 90 %    2 Minute Liters of Oxygen 3 L 4 L    3 Minute Oxygen Saturation % 91 % 90 %    3 Minute Liters of Oxygen 3 L 4 L    4 Minute Oxygen Saturation % 90 % 87 %    4 Minute Liters of Oxygen 3 L 4 L    5 Minute Oxygen Saturation % 95 % 85 %    5 Minute Liters of Oxygen 3 L 4 L    6 Minute Oxygen Saturation % 99 % 97 %    6 Minute Liters of Oxygen 3 L 4 L    2 Minute Post Oxygen Saturation % 99 % 100 %    2 Minute Post Liters of Oxygen 3 L 4 L           Psychological, QOL, Others - Outcomes: PHQ 2/9: Depression screen [facility] 2/9 09/30/2020 07/27/2020  Decreased Interest 0 0  Down, Depressed, Hopeless 0 0  PHQ - 2 Score 0 0  Altered sleeping 1 3  Tired, decreased energy 0 2  Change in appetite 0 2  Feeling bad or failure about yourself  0 0  Trouble concentrating 0 0  Moving slowly or fidgety/restless 0 1  Suicidal thoughts 0 0  PHQ-9 Score 1 8  Difficult doing work/chores - Somewhat difficult  Some recent data might be hidden    Quality of Life:   Personal Goals: Goals established at orientation with interventions provided  to work toward goal.  Personal Goals and Risk Factors at Admission - 08/04/20 1501      Core Components/Risk Factors/Patient Goals on Admission    Weight Management Weight Gain            Personal Goals Discharge:  Goals and Risk Factor Review    Row Name 08/04/20 1502 08/23/20 1527 09/22/20 1622         Core Components/Risk Factors/Patient Goals Review   Personal Goals Review Increase knowledge of respiratory medications and ability to use respiratory devices properly.;Develop more efficient breathing techniques such as purse lipped breathing and diaphragmatic breathing and practicing self-pacing with activity.;Improve shortness of breath with ADL's;Weight Management/Obesity Increase knowledge of respiratory medications and ability to use respiratory devices  properly.;Develop more efficient breathing techniques such as purse lipped breathing and diaphragmatic breathing and practicing self-pacing with activity.;Improve shortness of breath with ADL's;Weight Management/Obesity Increase knowledge of respiratory medications and ability to use respiratory devices properly.;Develop more efficient breathing techniques such as purse lipped breathing and diaphragmatic breathing and practicing self-pacing with activity.;Improve shortness of breath with ADL's;Weight Management/Obesity     Review Pt has completed 1 exercise session.  Pt has chronic issues with stomach and dizziness. Pt desires to gain weight.  Pt will meet with Ailene Ravel RD on techniques pt can use to stimulate his appetite and increase his caloric intake.  Will continue to review with pt PLB although at times it is difficult to assess whether he understands due to his dry humor. Pt has completed 6 exercise session.  Pt has chronic issues with stomach and dizziness. Pt able to achieve 30 minutes of exercise on the stepper at level 2with frequent breaks and extended trip to the bathroom.  Pt desires to gain weight and has a  weight gain of 1 kg.   Will continue to review PLB and diaphragmatic breathing. This is difficult for this pt as he fatigues and tires very easily. Continue to work on pacing Pt has completed 14 exercise session. Pt able to achieve 30 minutes of exercise on the stepper at level 2with frequent breaks and extended trip to the bathroom.  Pt desires to gain weight and has a  weight gain of 1.3 kg. Robert Dixon has met with the RD on ways to increase his caloric intake.  Will continue to review PLB and diaphragmatic breathing. This is difficult for this pt as he fatigues and tires very easily. Continue to work on pacing     Expected Outcomes See Admission Goals See Admission Goals See Admission Goals            Exercise Goals and Review:  Exercise Goals    Row Name 07/27/20 1637              Exercise Goals   Increase Physical Activity Yes       Intervention Provide advice, education, support and counseling about physical activity/exercise needs.;Develop an individualized exercise prescription for aerobic and resistive training based on initial evaluation findings, risk stratification, comorbidities and participant's personal goals.       Expected Outcomes Short Term: Attend rehab on a regular basis to increase amount of physical activity.;Long Term: Add in home exercise to make exercise part of routine and to increase amount of physical activity.;Long Term: Exercising regularly at least 3-5 days a week.       Increase Strength and Stamina Yes       Intervention Provide advice, education, support and counseling about physical activity/exercise needs.;Develop an individualized exercise  prescription for aerobic and resistive training based on initial evaluation findings, risk stratification, comorbidities and participant's personal goals.       Expected Outcomes Short Term: Increase workloads from initial exercise prescription for resistance, speed, and METs.;Short Term: Perform resistance training exercises routinely during rehab and add in resistance training at home;Long Term: Improve cardiorespiratory fitness, muscular endurance and strength as measured by increased METs and functional capacity (6MWT)       Able to understand and use rate of perceived exertion (RPE) scale Yes       Intervention Provide education and explanation on how to use RPE scale       Expected Outcomes Short Term: Able to use RPE daily in rehab to express subjective intensity level;Long Term:  Able to use RPE to guide intensity level when exercising independently       Able to understand and use Dyspnea scale Yes       Intervention Provide education and explanation on how to use Dyspnea scale       Expected Outcomes Short Term: Able to use Dyspnea scale daily in rehab to express subjective sense of shortness of breath  during exertion;Long Term: Able to use Dyspnea scale to guide intensity level when exercising independently       Knowledge and understanding of Target Heart Rate Range (THRR) Yes       Intervention Provide education and explanation of THRR including how the numbers were predicted and where they are located for reference       Expected Outcomes Short Term: Able to state/look up THRR;Short Term: Able to use daily as guideline for intensity in rehab;Long Term: Able to use THRR to govern intensity when exercising independently       Understanding of Exercise Prescription Yes       Intervention Provide education, explanation, and written materials on patient's individual exercise prescription       Expected Outcomes Short Term: Able to explain program exercise prescription;Long Term: Able to explain home exercise prescription to exercise independently              Exercise Goals Re-Evaluation:  Exercise Goals Re-Evaluation    Row Name 08/05/20 1546 08/24/20 0750 09/21/20 1608         Exercise Goal Re-Evaluation   Exercise Goals Review Increase Physical Activity;Increase Strength and Stamina;Able to understand and use rate of perceived exertion (RPE) scale;Able to understand and use Dyspnea scale;Knowledge and understanding of Target Heart Rate Range (THRR);Able to check pulse independently;Understanding of Exercise Prescription;Improve claudication pain tolerance and improve walking ability Increase Physical Activity;Increase Strength and Stamina;Able to understand and use rate of perceived exertion (RPE) scale;Able to understand and use Dyspnea scale;Knowledge and understanding of Target Heart Rate Range (THRR);Understanding of Exercise Prescription Increase Physical Activity;Increase Strength and Stamina;Able to understand and use rate of perceived exertion (RPE) scale;Able to understand and use Dyspnea scale;Knowledge and understanding of Target Heart Rate Range (THRR);Understanding of Exercise  Prescription     Comments Pt has completed 2 exercise sessions. He is deconditioned and has only been able to do 25 minutes on the Nustep, instead of 30 minutes. During the resistance exercises he has to stop and take a couple of breaks due to shortness of break and fatigue. He seems to have a positive outlook and does not have any complaints. He is currently exercising at 1.6 METs on the Nustep Pt has completed 6 exercise sessions and is slow to make progressions. He has to take breaks during his 52  minutes on the Nustep due to SOB and muscle weakness. He has made progressions with the resistance bands and does not need to rest in between those exercises. He is exercising at 1.8 METS on the Nustep. We will continue to monitor and progress as he is able. Pt has completed 14 exercise sessions and has been very slow to make progression. He has to take several rest breaks during his 30 minutes on the Nustep. He has made some progression in his METS but is still on level 2 on the Nustep. He is currently exercising at 2.1 METS. Will continue to monitor and progress as able.     Expected Outcomes Through exercise at rehab and home the patient will decrease shortness of breath with daily activities and feel confident in carrying out an exercise regimn at home Through exercise at rehab and home the patient will decrease shortness of breath with daily activities and feel confident in carrying out an exercise regimn at home Through exercise at rehab and home the patient will decrease shortness of breath with daily activities and feel confident in carrying out an exercise regimn at home            Nutrition & Weight - Outcomes:   Post Biometrics - 09/30/20 1145       Post  Biometrics   Grip Strength 24 kg           Nutrition:  Nutrition Therapy & Goals - 08/12/20 1209      Nutrition Therapy   Diet High cal/high protein      Personal Nutrition Goals   Nutrition Goal Pt to identify food quantities  necessary to achieve weight gain of 0.25 lbs per week    Personal Goal #2 Pt to increase calories and protein to diet by incorporating peanut butter, avocados, whole milk, and instant breakfasts daily    Personal Goal #3 Homemade smoothies daily      Intervention Plan   Intervention Prescribe, educate and counsel regarding individualized specific dietary modifications aiming towards targeted core components such as weight, hypertension, lipid management, diabetes, heart failure and other comorbidities.;Nutrition handout(s) given to patient.    Expected Outcomes Short Term Goal: A plan has been developed with personal nutrition goals set during dietitian appointment.           Nutrition Discharge:  Nutrition Assessments - 10/07/20 0855      Rate Your Plate Scores   Post Score 65           Education Questionnaire Score:  Knowledge Questionnaire Score - 09/30/20 1145      Knowledge Questionnaire Score   Pre Score 15/18    Post Score 17/18           Goals reviewed with patient; copy given to patient.

## 2020-10-12 ENCOUNTER — Other Ambulatory Visit: Payer: Self-pay

## 2020-10-12 ENCOUNTER — Inpatient Hospital Stay: Payer: Medicare HMO | Attending: Internal Medicine

## 2020-10-12 DIAGNOSIS — Z452 Encounter for adjustment and management of vascular access device: Secondary | ICD-10-CM | POA: Diagnosis not present

## 2020-10-12 DIAGNOSIS — C3432 Malignant neoplasm of lower lobe, left bronchus or lung: Secondary | ICD-10-CM | POA: Diagnosis not present

## 2020-10-12 DIAGNOSIS — Z9221 Personal history of antineoplastic chemotherapy: Secondary | ICD-10-CM | POA: Diagnosis not present

## 2020-10-12 DIAGNOSIS — Z95828 Presence of other vascular implants and grafts: Secondary | ICD-10-CM

## 2020-10-12 MED ORDER — SODIUM CHLORIDE 0.9% FLUSH
10.0000 mL | INTRAVENOUS | Status: DC | PRN
  Administered 2020-10-12: 10 mL
  Filled 2020-10-12: qty 10

## 2020-10-12 MED ORDER — HEPARIN SOD (PORK) LOCK FLUSH 100 UNIT/ML IV SOLN
500.0000 [IU] | Freq: Once | INTRAVENOUS | Status: AC | PRN
  Administered 2020-10-12: 500 [IU]
  Filled 2020-10-12: qty 5

## 2020-10-25 IMAGING — MR MR HEAD WO/W CM
10 of 13 series · 37 of 48 positions shown · IV contrast (gadavist)
Comparison: None.

CLINICAL DATA: Non-small cell lung cancer.  Staging.

EXAM:
MRI HEAD WITHOUT AND WITH CONTRAST
TECHNIQUE: Multiplanar, multiecho pulse sequences of the brain and surrounding
structures were obtained without and with intravenous contrast.
CONTRAST:  10 cc Gadavist

[Series 3: T1 · sagittal · 5.0mm · 0.47mm/px · 1 of 26 slices shown]
[im 1/26]
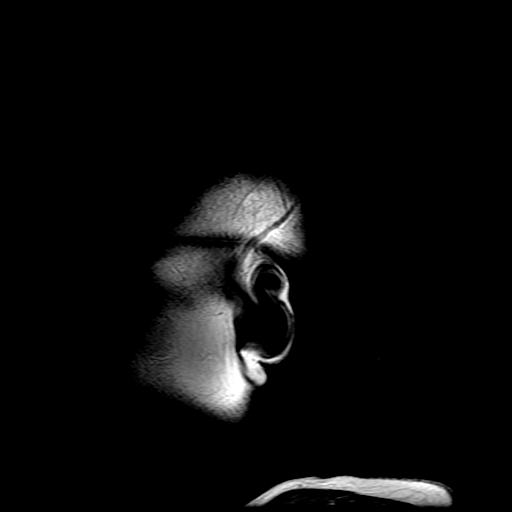

[Series 4: DWI · axial · 3.0mm · 1.09mm/px · z∈[-44,+116]mm · 8 of 110 slices shown (1 of 4)]
[im 1/110]
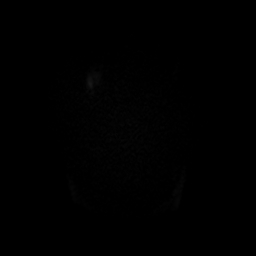
[im 16/110]
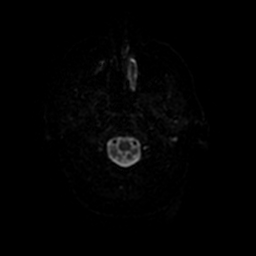
[im 32/110]
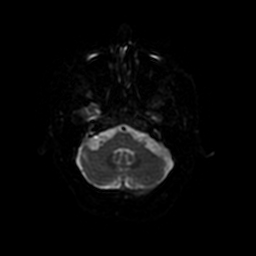
[im 47/110]
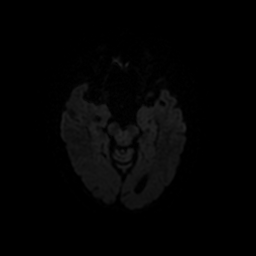
[im 63/110]
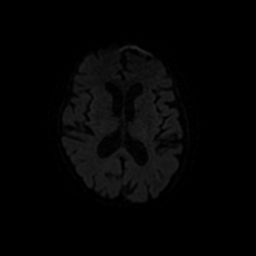
[im 78/110]
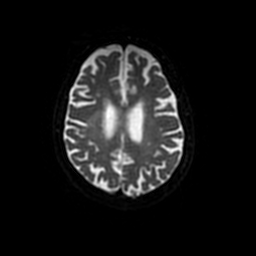
[im 94/110]
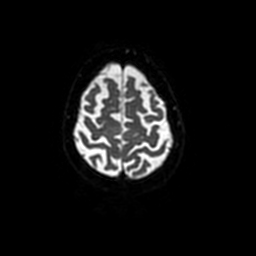
[im 110/110]
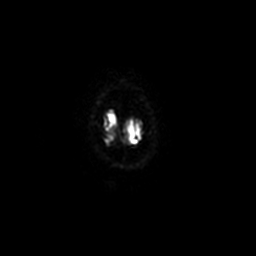

[Series 5: DWI · coronal · 3.0mm · 1.09mm/px · 9 of 120 slices shown (2 of 4)]
[im 1/120]
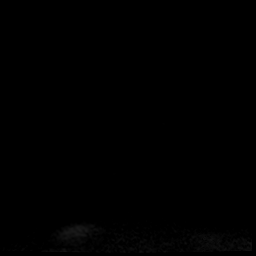
[im 15/120]
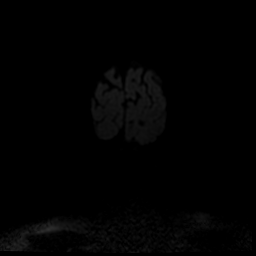
[im 30/120]
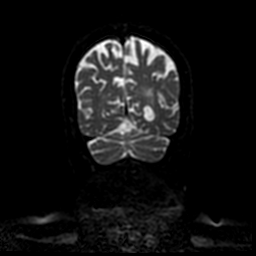
[im 45/120]
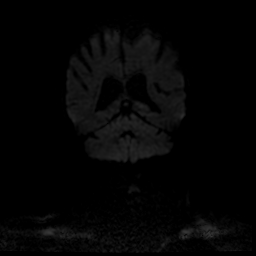
[im 60/120]
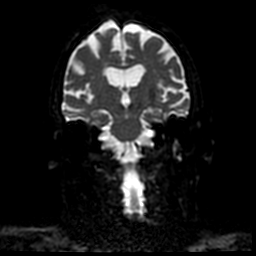
[im 75/120]
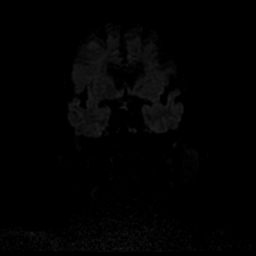
[im 90/120]
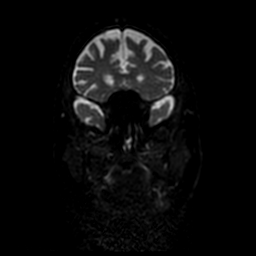
[im 105/120]
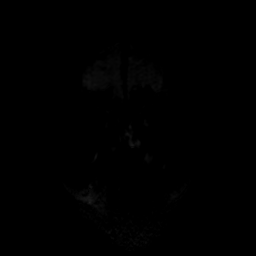
[im 120/120]
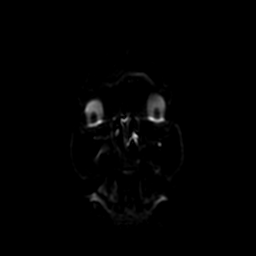

[Series 6: T2 · axial · 5.0mm · 0.45mm/px · z∈[-39,+112]mm · 2 of 23 slices shown]
[im 1/23]
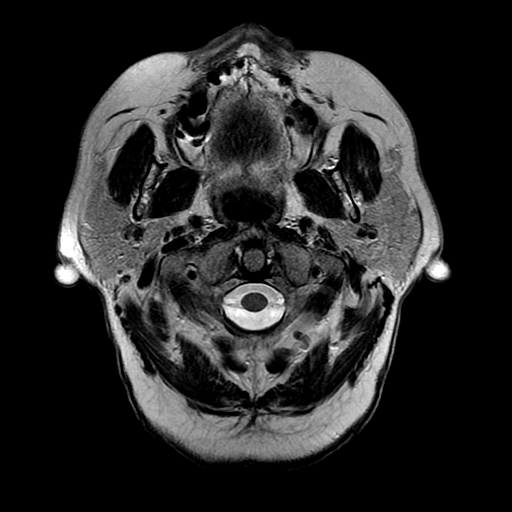
[im 23/23]
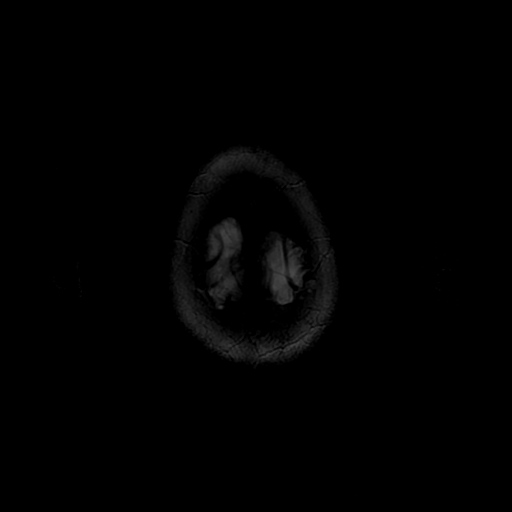

[Series 7: FLAIR · axial · 3.0mm · 0.45mm/px · z∈[-40,+113]mm · 2 of 27 slices shown]
[im 1/27]
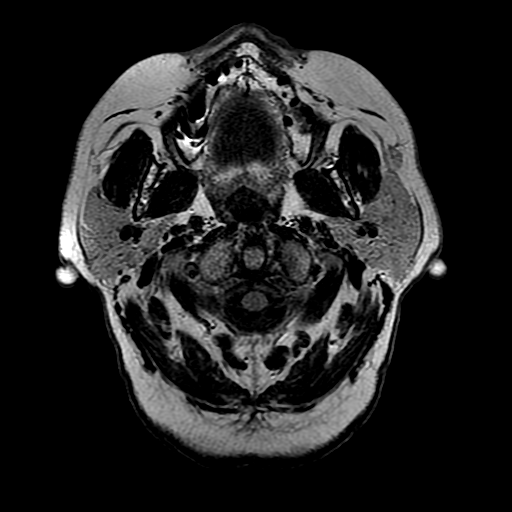
[im 27/27]
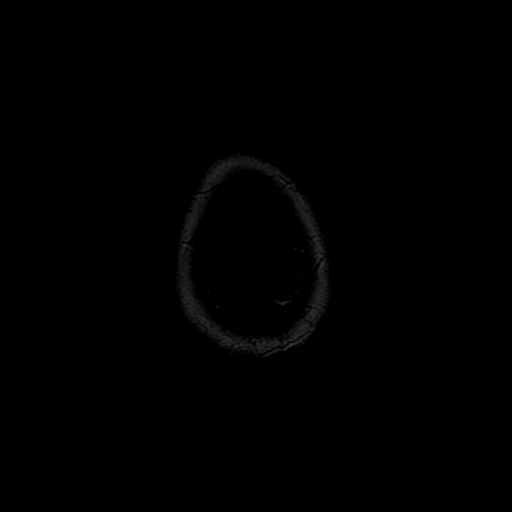

[Series 10: T2 post-contrast · coronal · 5.0mm · 0.45mm/px · 2 of 28 slices shown]
[im 1/28]
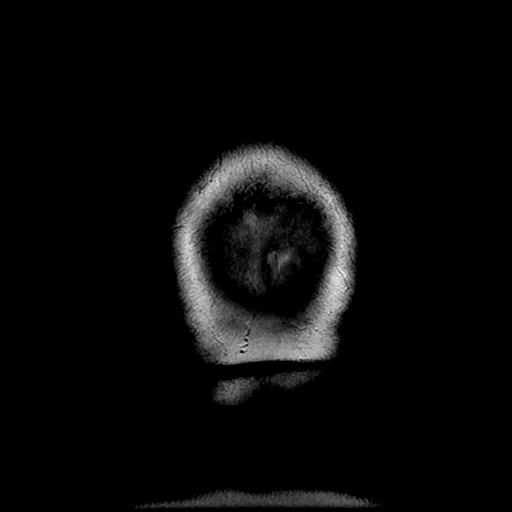
[im 28/28]
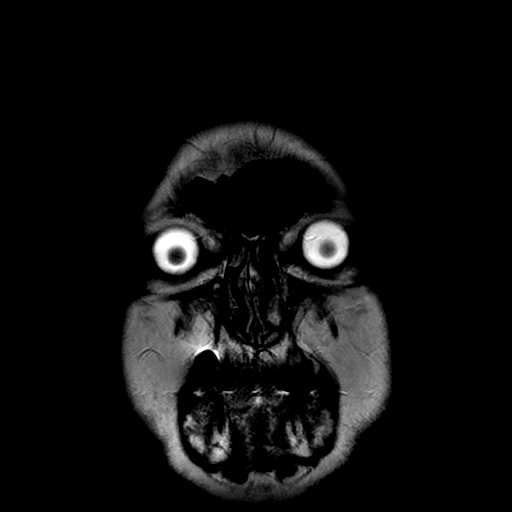

[Series 12: T1 post-contrast · coronal · 5.0mm · 0.45mm/px · 2 of 28 slices shown (1 of 2)]
[im 1/28]
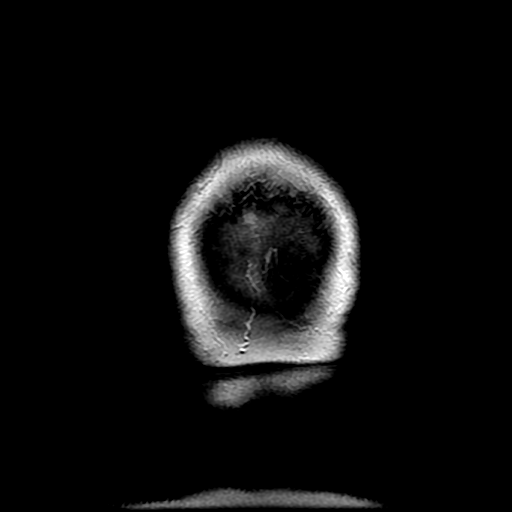
[im 28/28]
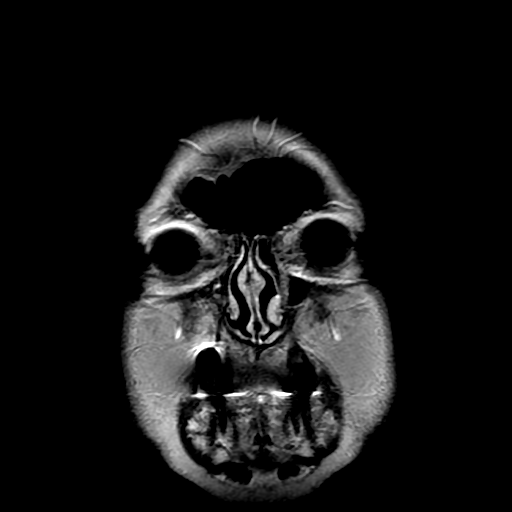

[Series 13: T1 post-contrast · sagittal · 5.0mm · 0.47mm/px · 2 of 26 slices shown (2 of 2)]
[im 1/26]
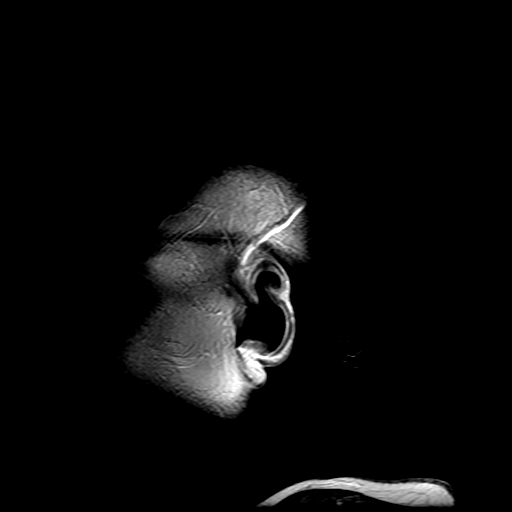
[im 26/26]
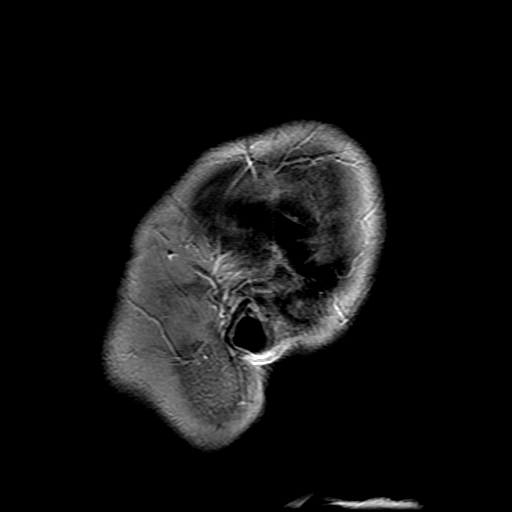

[Series 400: DWI · axial · 3.0mm · 1.09mm/px · z∈[-44,+116]mm · 4 of 55 slices shown (3 of 4)]
[im 1/55]
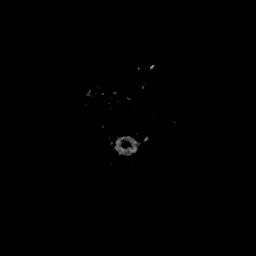
[im 19/55]
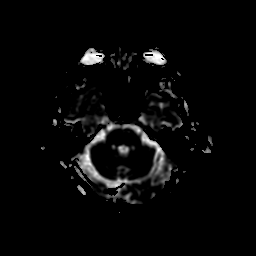
[im 37/55]
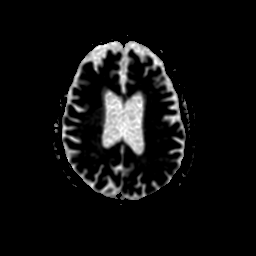
[im 55/55]
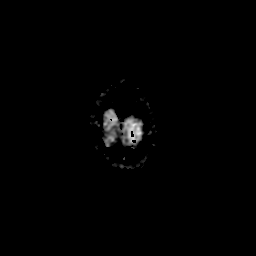

[Series 500: DWI · coronal · 3.0mm · 1.09mm/px · 5 of 60 slices shown (4 of 4)]
[im 1/60]
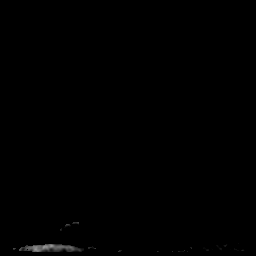
[im 15/60]
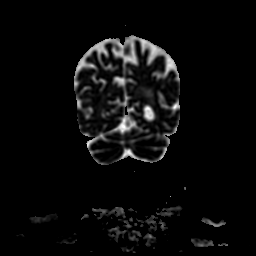
[im 30/60]
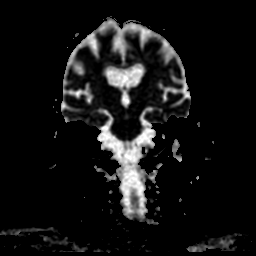
[im 45/60]
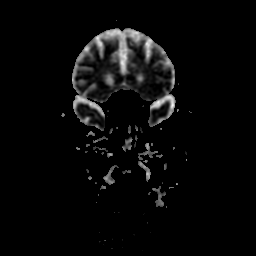
[im 60/60]
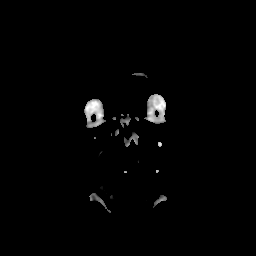

[37 of 48 positions shown; findings below may reference images not displayed]

FINDINGS: Brain: Diffusion imaging does not show any acute or subacute
infarction. There is moderate generalized atrophy. There are mild
chronic small-vessel ischemic changes of the cerebral hemispheric
white matter. There is no evidence of metastatic disease. No
hemorrhage, hydrocephalus or extra-axial collection.

Vascular: Major vessels at the base of the brain show flow.

Skull and upper cervical spine: Negative

Sinuses/Orbits: Sinuses are clear except for a small amount of fluid
in the right maxillary sinus. Orbits appear normal. Small amount of
fluid in the left mastoid tip air cells.

Other: None
IMPRESSION: No evidence of metastatic disease.

Atrophy and chronic small-vessel ischemic changes.

## 2020-10-28 DIAGNOSIS — J439 Emphysema, unspecified: Secondary | ICD-10-CM | POA: Diagnosis not present

## 2020-10-29 ENCOUNTER — Inpatient Hospital Stay: Payer: Medicare HMO

## 2020-10-29 ENCOUNTER — Ambulatory Visit (HOSPITAL_COMMUNITY)
Admission: RE | Admit: 2020-10-29 | Discharge: 2020-10-29 | Disposition: A | Discharge status: Home / Self Care | Payer: Medicare HMO | Source: Ambulatory Visit | Attending: Internal Medicine | Admitting: Internal Medicine

## 2020-10-29 ENCOUNTER — Other Ambulatory Visit: Payer: Self-pay

## 2020-10-29 ENCOUNTER — Encounter (HOSPITAL_COMMUNITY): Payer: Self-pay

## 2020-10-29 ENCOUNTER — Inpatient Hospital Stay: Payer: Medicare HMO | Attending: Internal Medicine

## 2020-10-29 DIAGNOSIS — Z79899 Other long term (current) drug therapy: Secondary | ICD-10-CM | POA: Insufficient documentation

## 2020-10-29 DIAGNOSIS — Z9981 Dependence on supplemental oxygen: Secondary | ICD-10-CM | POA: Insufficient documentation

## 2020-10-29 DIAGNOSIS — J449 Chronic obstructive pulmonary disease, unspecified: Secondary | ICD-10-CM | POA: Diagnosis not present

## 2020-10-29 DIAGNOSIS — C3490 Malignant neoplasm of unspecified part of unspecified bronchus or lung: Secondary | ICD-10-CM

## 2020-10-29 DIAGNOSIS — C3432 Malignant neoplasm of lower lobe, left bronchus or lung: Secondary | ICD-10-CM | POA: Diagnosis not present

## 2020-10-29 DIAGNOSIS — Z9221 Personal history of antineoplastic chemotherapy: Secondary | ICD-10-CM | POA: Diagnosis not present

## 2020-10-29 DIAGNOSIS — K219 Gastro-esophageal reflux disease without esophagitis: Secondary | ICD-10-CM | POA: Diagnosis not present

## 2020-10-29 DIAGNOSIS — J439 Emphysema, unspecified: Secondary | ICD-10-CM | POA: Diagnosis not present

## 2020-10-29 DIAGNOSIS — Z7982 Long term (current) use of aspirin: Secondary | ICD-10-CM | POA: Insufficient documentation

## 2020-10-29 DIAGNOSIS — I5022 Chronic systolic (congestive) heart failure: Secondary | ICD-10-CM | POA: Diagnosis not present

## 2020-10-29 DIAGNOSIS — I110 Hypertensive heart disease with heart failure: Secondary | ICD-10-CM | POA: Insufficient documentation

## 2020-10-29 DIAGNOSIS — Z85828 Personal history of other malignant neoplasm of skin: Secondary | ICD-10-CM | POA: Diagnosis not present

## 2020-10-29 DIAGNOSIS — Z923 Personal history of irradiation: Secondary | ICD-10-CM | POA: Insufficient documentation

## 2020-10-29 DIAGNOSIS — J90 Pleural effusion, not elsewhere classified: Secondary | ICD-10-CM | POA: Diagnosis not present

## 2020-10-29 LAB — CBC WITH DIFFERENTIAL (CANCER CENTER ONLY)
Abs Immature Granulocytes: 0.02 10*3/uL (ref 0.00–0.07)
Basophils Absolute: 0.1 10*3/uL (ref 0.0–0.1)
Basophils Relative: 2 %
Eosinophils Absolute: 0.1 10*3/uL (ref 0.0–0.5)
Eosinophils Relative: 2 %
HCT: 30.7 % — ABNORMAL LOW (ref 39.0–52.0)
Hemoglobin: 10.1 g/dL — ABNORMAL LOW (ref 13.0–17.0)
Immature Granulocytes: 0 %
Lymphocytes Relative: 11 %
Lymphs Abs: 0.6 10*3/uL — ABNORMAL LOW (ref 0.7–4.0)
MCH: 37.7 pg — ABNORMAL HIGH (ref 26.0–34.0)
MCHC: 32.9 g/dL (ref 30.0–36.0)
MCV: 114.6 fL — ABNORMAL HIGH (ref 80.0–100.0)
Monocytes Absolute: 0.5 10*3/uL (ref 0.1–1.0)
Monocytes Relative: 10 %
Neutro Abs: 4.1 10*3/uL (ref 1.7–7.7)
Neutrophils Relative %: 75 %
Platelet Count: 218 10*3/uL (ref 150–400)
RBC: 2.68 MIL/uL — ABNORMAL LOW (ref 4.22–5.81)
RDW: 14.6 % (ref 11.5–15.5)
WBC Count: 5.5 10*3/uL (ref 4.0–10.5)
nRBC: 0 % (ref 0.0–0.2)

## 2020-10-29 LAB — CMP (CANCER CENTER ONLY)
ALT: 16 U/L (ref 0–44)
AST: 17 U/L (ref 15–41)
Albumin: 3.6 g/dL (ref 3.5–5.0)
Alkaline Phosphatase: 143 U/L — ABNORMAL HIGH (ref 38–126)
Anion gap: 9 (ref 5–15)
BUN: 17 mg/dL (ref 8–23)
CO2: 24 mmol/L (ref 22–32)
Calcium: 8.8 mg/dL — ABNORMAL LOW (ref 8.9–10.3)
Chloride: 109 mmol/L (ref 98–111)
Creatinine: 0.94 mg/dL (ref 0.61–1.24)
GFR, Estimated: >60 mL/min (ref >60)
Glucose, Bld: 89 mg/dL (ref 70–99)
Potassium: 4.2 mmol/L (ref 3.5–5.1)
Sodium: 142 mmol/L (ref 135–145)
Total Bilirubin: 0.7 mg/dL (ref 0.3–1.2)
Total Protein: 6.4 g/dL — ABNORMAL LOW (ref 6.5–8.1)

## 2020-10-29 MED ORDER — HEPARIN SOD (PORK) LOCK FLUSH 100 UNIT/ML IV SOLN
500.0000 [IU] | Freq: Once | INTRAVENOUS | Status: AC
  Administered 2020-10-29: 500 [IU] via INTRAVENOUS

## 2020-10-29 MED ORDER — IOHEXOL 300 MG/ML  SOLN
75.0000 mL | Freq: Once | INTRAMUSCULAR | Status: AC | PRN
  Administered 2020-10-29: 75 mL via INTRAVENOUS

## 2020-10-29 MED ORDER — HEPARIN SOD (PORK) LOCK FLUSH 100 UNIT/ML IV SOLN
INTRAVENOUS | Status: AC
  Filled 2020-10-29: qty 5

## 2020-11-01 ENCOUNTER — Encounter: Payer: Self-pay | Admitting: Internal Medicine

## 2020-11-01 ENCOUNTER — Inpatient Hospital Stay (HOSPITAL_BASED_OUTPATIENT_CLINIC_OR_DEPARTMENT_OTHER): Payer: Medicare HMO | Admitting: Internal Medicine

## 2020-11-01 ENCOUNTER — Other Ambulatory Visit: Payer: Self-pay

## 2020-11-01 VITALS — BP 103/82 | HR 97 | Temp 97.8°F | Resp 18 | Ht 68.0 in | Wt 161.3 lb

## 2020-11-01 DIAGNOSIS — Z9981 Dependence on supplemental oxygen: Secondary | ICD-10-CM | POA: Diagnosis not present

## 2020-11-01 DIAGNOSIS — K219 Gastro-esophageal reflux disease without esophagitis: Secondary | ICD-10-CM | POA: Diagnosis not present

## 2020-11-01 DIAGNOSIS — Z85118 Personal history of other malignant neoplasm of bronchus and lung: Secondary | ICD-10-CM | POA: Diagnosis not present

## 2020-11-01 DIAGNOSIS — I5022 Chronic systolic (congestive) heart failure: Secondary | ICD-10-CM | POA: Diagnosis not present

## 2020-11-01 DIAGNOSIS — C3412 Malignant neoplasm of upper lobe, left bronchus or lung: Secondary | ICD-10-CM

## 2020-11-01 DIAGNOSIS — Z923 Personal history of irradiation: Secondary | ICD-10-CM | POA: Diagnosis not present

## 2020-11-01 DIAGNOSIS — C3432 Malignant neoplasm of lower lobe, left bronchus or lung: Secondary | ICD-10-CM | POA: Diagnosis not present

## 2020-11-01 DIAGNOSIS — C3492 Malignant neoplasm of unspecified part of left bronchus or lung: Secondary | ICD-10-CM

## 2020-11-01 DIAGNOSIS — Z7982 Long term (current) use of aspirin: Secondary | ICD-10-CM | POA: Diagnosis not present

## 2020-11-01 DIAGNOSIS — C3490 Malignant neoplasm of unspecified part of unspecified bronchus or lung: Secondary | ICD-10-CM

## 2020-11-01 DIAGNOSIS — Z9221 Personal history of antineoplastic chemotherapy: Secondary | ICD-10-CM | POA: Diagnosis not present

## 2020-11-01 DIAGNOSIS — I110 Hypertensive heart disease with heart failure: Secondary | ICD-10-CM | POA: Diagnosis not present

## 2020-11-01 DIAGNOSIS — J449 Chronic obstructive pulmonary disease, unspecified: Secondary | ICD-10-CM | POA: Diagnosis not present

## 2020-11-01 NOTE — Progress Notes (Signed)
[facility] Telephone:([phone]) [phone]   [phone]) [phone]  OFFICE PROGRESS NOTE  [name], MD [address] [place] [address]  DIAGNOSIS: Stage IIIB (T1b, N3, M0) non-small cell lung cancer favoring adenocarcinoma diagnosed in January 2020 and presented with left lower lobe pulmonary nodule in addition to bilateral hilar and subcarinal lymphadenopathy.  Molecular studies by guardant 360 showed no actionable mutations.  PRIOR THERAPY:  1) Concurrent chemoradiation with weekly carboplatin for AUC of 2 and paclitaxel 45 mg/M2. First dose February 4th, 2020. Status post 5 cycles.  2)  Consolidation immunotherapy with Imfinzi 10 mg/KG every 2 weeks, status post 26 cycles.  CURRENT THERAPY:  Observation.  INTERVAL HISTORY: Robert BRUMETT 79 y.o. male returns to the clinic today for follow-up visit accompanied by a daughter-in-law.  The patient is feeling fine today with no concerning complaints except for the baseline shortness of breath and he is currently on home oxygen.  He denied having any chest pain, cough or hemoptysis.  He denied having any nausea, vomiting, diarrhea or constipation.  He denied having any headache or visual changes.  He is currently on observation.  He had repeat CT scan of the chest performed recently and is here for evaluation and discussion of his scan results.   MEDICAL HISTORY: Past Medical History:  Diagnosis Date  . Acute on chronic respiratory failure with hypoxia ([place]) 03/16/2020  . Acute respiratory failure ([place]) 04/09/2013  . Carotid artery disease ([place]) 03/28/2020   Carotid US 02/2020:  R 100%; L 1-39%  . Chronic systolic CHF 81/19/1478   Echo 02/2020: EF 30-35, small pericardial effusion, trivial MR, status post TAVR with no PVL, mean gradient 7 mmHg  . Complication of anesthesia   . COPD (chronic obstructive pulmonary disease) ([place])   . GERD (gastroesophageal reflux disease)   . Hemoptysis 11/20/2018  .  History of hiatal hernia   . Hypertension   . 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 Mediastinal adenopathy 12/06/2018  . Migraine with visual aura   . Non-small cell carcinoma of left lung, stage 3 ([facility]) 12/19/2018  . Non-small cell lung cancer ([place]) dx'd 11/20/18  . Pericardial effusion 10/14/2019  . Pneumonia 11/20/2018  . PONV (postoperative nausea and vomiting)   . Primary malignant neoplasm of left upper lobe of lung ([place]) 12/12/2018  . Pulmonary nodule 11/20/2018  . 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 Severe aortic stenosis 10/14/2019  . Skin cancer     ALLERGIES:  has No Known Allergies.  MEDICATIONS:  Current Outpatient Medications  Medication Sig Dispense Refill  . acetaminophen (TYLENOL) 325 MG tablet Take 2 tablets (650 mg total) by mouth every 6 (six) hours as needed for mild pain (or Fever >/= 101).    Marland Kitchen albuterol (PROVENTIL) (2.5 MG/3ML) 0.083% nebulizer solution Take 3 mLs (2.5 mg total) by nebulization every 4 (four) hours. 1080 mL 5  . albuterol (VENTOLIN HFA) 108 (90 Base) MCG/ACT inhaler Inhale 2 puffs into the lungs every 6 (six) hours as needed for wheezing or shortness of breath. 18 g 5  . alum & mag hydroxide-simeth (MAALOX/MYLANTA) 200-200-20 MG/5ML suspension Take 15 mLs by mouth every 6 (six) hours as needed for indigestion or heartburn. 355 mL 0  . ascorbic acid (VITAMIN C) 500 MG tablet Take 500 mg by mouth daily.    Marland Kitchen aspirin 81 MG chewable tablet Chew 1 tablet (81 mg total) by mouth daily.    Marland Kitchen atorvastatin (  LIPITOR) 40 MG tablet Take 1 tablet (40 mg total) by mouth daily. 90 tablet 3  . citalopram (CELEXA) 10 MG tablet Take 10 mg by mouth daily.    . Cyanocobalamin (VITAMIN B 12 PO) Take 1 capsule by mouth daily. 1052mcg 3 times weekly    . Fluticasone-Umeclidin-Vilant (TRELEGY ELLIPTA) 100-62.5-25 MCG/INH AEPB Inhale 1 puff into the lungs daily. 60 each 0  . Fluticasone-Umeclidin-Vilant (TRELEGY ELLIPTA) 100-62.5-25 MCG/INH AEPB  Inhale 1 puff into the lungs daily. 28 each 0  . furosemide (LASIX) 20 MG tablet Take 1 tablet (20 mg total) by mouth daily. 90 tablet 3  . metoprolol succinate (TOPROL-XL) 25 MG 24 hr tablet Take 0.5 tablets (12.5 mg total) by mouth daily. 45 tablet 3  . Multiple Vitamin (MULTIVITAMIN) tablet Take 1 tablet by mouth daily.    . pantoprazole (PROTONIX) 40 MG tablet Take 40 mg by mouth 2 (two) times daily.    . polycarbophil (FIBERCON) 625 MG tablet Take 625 mg by mouth 2 (two) times daily.     . tamsulosin (FLOMAX) 0.4 MG CAPS capsule Take 0.4 mg by mouth daily.     . TRELEGY ELLIPTA 100-62.5-25 MCG/INH AEPB Inhale 1 puff into the lungs daily. 180 each 1   No current facility-administered medications for this visit.   Facility-Administered Medications Ordered in Other Visits  Medication Dose Route Frequency Provider Last Rate Last Admin  . sodium chloride flush (NS) 0.9 % injection 10 mL  10 mL Intracatheter PRN [name], MD      . sodium chloride flush (NS) 0.9 % injection 10 mL  10 mL Intracatheter PRN [name], MD   10 mL at 05/20/19 1232    SURGICAL HISTORY:  Past Surgical History:  Procedure Laterality Date  . COLONOSCOPY W/ POLYPECTOMY    . IR IMAGING GUIDED PORT INSERTION  03/14/2019  . IR THORACENTESIS ASP PLEURAL SPACE W/IMG GUIDE  03/26/2020  . RIGHT HEART CATH AND CORONARY ANGIOGRAPHY N/A 03/19/2020   Procedure: RIGHT HEART CATH AND CORONARY ANGIOGRAPHY;  Surgeon: [name], MD;  Location: [facility];  Service: Cardiovascular;  Laterality: N/A;  . TEE WITHOUT CARDIOVERSION N/A 03/25/2020   Procedure: TRANSESOPHAGEAL ECHOCARDIOGRAM (TEE);  Surgeon: [name], MD;  Location: [facility];  Service: Open Heart Surgery;  Laterality: N/A;  . TONSILLECTOMY    . TRANSCATHETER AORTIC VALVE REPLACEMENT, TRANSFEMORAL Left 03/25/2020   Procedure: TRANSCATHETER AORTIC VALVE REPLACEMENT, LEFT TRANSFEMORAL;  Surgeon: [name], MD;  Location: [facility];  Service: Open Heart Surgery;  Laterality: Left;  Marland Kitchen VASECTOMY    . VIDEO BRONCHOSCOPY WITH ENDOBRONCHIAL ULTRASOUND N/A 12/06/2018   Procedure: VIDEO BRONCHOSCOPY WITH ENDOBRONCHIAL ULTRASOUND;  Surgeon: Garner Nash, DO;  Location: [facility];  Service: Thoracic;  Laterality: N/A;    REVIEW OF SYSTEMS:  A comprehensive review of systems was negative except for: Respiratory: positive for dyspnea on exertion   PHYSICAL EXAMINATION: General appearance: alert, cooperative and no distress Head: Normocephalic, without obvious abnormality, atraumatic Neck: no adenopathy, no JVD, supple, symmetrical, trachea midline and thyroid not enlarged, symmetric, no tenderness/mass/nodules Lymph nodes: Cervical, supraclavicular, and axillary nodes normal. Resp: clear to auscultation bilaterally Back: symmetric, no curvature. ROM normal. No CVA tenderness. Cardio: systolic murmur: systolic ejection 3/6, harsh at 2nd right intercostal space GI: soft, non-tender; bowel sounds normal; no masses,  no organomegaly Extremities: extremities normal, atraumatic, no cyanosis or edema  ECOG PERFORMANCE STATUS: 1 - Symptomatic but completely ambulatory  Blood pressure 103/82,  pulse 97, temperature 97.8 F (36.6 C), temperature source Tympanic, resp. rate 18, height 5\' 8"  (1.727 m), weight 161 lb 4.8 oz (73.2 kg), SpO2 92 %.  LABORATORY DATA: Lab Results  Component Value Date   WBC 5.5 10/29/2020   HGB 10.1 (L) 10/29/2020   HCT 30.7 (L) 10/29/2020   MCV 114.6 (H) 10/29/2020   PLT 218 10/29/2020      Chemistry      Component Value Date/Time   NA 142 10/29/2020 0805   K 4.2 10/29/2020 0805   CL 109 10/29/2020 0805   CO2 24 10/29/2020 0805   BUN 17 10/29/2020 0805   CREATININE 0.94 10/29/2020 0805   CREATININE 1.20 (H) 08/25/2020 1140      Component Value Date/Time   CALCIUM 8.8 (L) 10/29/2020 0805   ALKPHOS 143 (H) 10/29/2020 0805   AST 17 10/29/2020 0805   ALT 16 10/29/2020 0805    BILITOT 0.7 10/29/2020 0805       RADIOGRAPHIC STUDIES: CT Chest W Contrast  Result Date: 10/29/2020 CLINICAL DATA:  Non-small cell lung cancer staging evaluation, history of radiotherapy and chemotherapy reportedly completed in March of 2020, history of trans arterial aortic valve replacement in 2021. EXAM: CT CHEST WITH CONTRAST TECHNIQUE: Multidetector CT imaging of the chest was performed during intravenous contrast administration. CONTRAST:  42mL OMNIPAQUE IOHEXOL 300 MG/ML  SOLN COMPARISON:  June 28, 2020 FINDINGS: Cardiovascular: Calcified and noncalcified atheromatous plaque in the thoracic aorta. Post trans arterial aortic valve replacement as before. Small to moderate pericardial effusion shows no change in appearance. No inherent nodularity. Three-vessel coronary artery disease. Central pulmonary vasculature unremarkable on venous phase assessment. Mediastinum/Nodes: RIGHT sided Port-A-Cath terminates at the caval to atrial junction. No thoracic inlet adenopathy. No axillary lymphadenopathy. No mediastinal lymphadenopathy. No hilar lymphadenopathy. Lungs/Pleura: Large LEFT-sided pleural effusion has a similar appearance to the prior study much of it is sub pulmonic as on the previous exam, there with slight increase in fluid tracking superiorly over the LEFT lung apex since the prior exam. No overt signs of nodularity along the margin of the effusion. Associated volume loss, consolidation and underlying bronchiectatic changes with similar appearance to the prior study. Marked pulmonary emphysema worse at the lung apices. Small RIGHT-sided pleural effusion slightly increased compared to the prior exam, minimal increase. Airways are patent. No new nodule or mass. Upper Abdomen: Imaged portions of the liver with stable low-density lesions likely cysts. Imaged portions of adrenal glands, spleen, pancreas, kidneys and gastrointestinal tract without acute process or change from the prior study. These  areas are incompletely imaged. Musculoskeletal: Osteopenia, spinal degenerative changes and signs of prior compression fracture with unchanged appearance. Chronic compression fracture at the T7 level with wedging and sclerosis as before. IMPRESSION: 1. Post treatment changes without signs of disease recurrence or metastasis in the chest. Slight interval increase in component of LEFT-sided effusion tracking towards the LEFT lung apex. No signs of nodularity with stable appearance of post treatment changes. 2. Small RIGHT-sided pleural effusion slightly increased compared to the prior exam, minimal increase. No signs of nodularity in the RIGHT chest. 3. Stable small to moderate pericardial effusion. 4. Post trans arterial aortic valve replacement. 5. Emphysema and aortic atherosclerosis. Aortic Atherosclerosis (ICD10-I70.0) and Emphysema (ICD10-J43.9). Electronically Signed   By: Zetta Bills M.D.   On: 10/29/2020 14:16    ASSESSMENT AND PLAN: This is a very pleasant 79 years old white male diagnosed with stage IIIb non-small cell lung cancer, adenocarcinoma with no actionable mutations  Completed the course of concurrent chemoradiation with weekly carboplatin and paclitaxel status post 5 cycles.  He has partial response to this treatment. The patient also completed consolidation treatment with immunotherapy with Imfinzi for 26 cycles. The patient is current on observation and he is feeling fine today with no concerning complaints. He had repeat CT scan of the chest performed recently.  I personally and independently reviewed the scans and discussed the results with the patient and his daughter-in-law today. His scan showed no concerning findings for disease progression. I recommended for him to continue on observation with repeat CT scan of the chest in 6 months. The patient will have a Port-A-Cath flush every 2 months. He was advised to call immediately if he has any concerning symptoms in the  interval. The patient voices understanding of current disease status and treatment options and is in agreement with the current care plan.  All questions were answered. The patient knows to call the clinic with any problems, questions or concerns. We can certainly see the patient much sooner if necessary.  Disclaimer: This note was dictated with voice recognition software. Similar sounding words can inadvertently be transcribed and may not be corrected upon review.

## 2020-11-05 ENCOUNTER — Telehealth: Payer: Self-pay | Admitting: Internal Medicine

## 2020-11-05 NOTE — Telephone Encounter (Signed)
Scheduled per los. Called and left msg. Mailed printout  °

## 2020-11-11 DIAGNOSIS — C4441 Basal cell carcinoma of skin of scalp and neck: Secondary | ICD-10-CM | POA: Diagnosis not present

## 2020-11-11 DIAGNOSIS — L218 Other seborrheic dermatitis: Secondary | ICD-10-CM | POA: Diagnosis not present

## 2020-11-11 DIAGNOSIS — C44329 Squamous cell carcinoma of skin of other parts of face: Secondary | ICD-10-CM | POA: Diagnosis not present

## 2020-11-11 DIAGNOSIS — L570 Actinic keratosis: Secondary | ICD-10-CM | POA: Diagnosis not present

## 2020-11-11 DIAGNOSIS — D485 Neoplasm of uncertain behavior of skin: Secondary | ICD-10-CM | POA: Diagnosis not present

## 2020-11-11 DIAGNOSIS — D044 Carcinoma in situ of skin of scalp and neck: Secondary | ICD-10-CM | POA: Diagnosis not present

## 2020-11-15 ENCOUNTER — Telehealth: Payer: Self-pay | Admitting: Pulmonary Disease

## 2020-11-15 MED ORDER — PREDNISONE 20 MG PO TABS
40.0000 mg | ORAL_TABLET | Freq: Every day | ORAL | 0 refills | Status: AC
Start: 2020-11-15 — End: 2020-11-20

## 2020-11-15 MED ORDER — LEVOFLOXACIN 500 MG PO TABS
500.0000 mg | ORAL_TABLET | Freq: Every day | ORAL | 0 refills | Status: AC
Start: 2020-11-15 — End: 2020-11-20

## 2020-11-15 NOTE — Telephone Encounter (Signed)
Prednisone 40 mg daily x 5 days, levofloxacin daily x 5 days for presumed COPD exacerbation. Scripts sent. If symptoms not improving in 48 hours needs to go to ED.

## 2020-11-15 NOTE — Telephone Encounter (Signed)
Spoke with pt, aware of recs.  rx's already sent to pharmacy.  Nothing further needed at this time- will close encounter.

## 2020-11-15 NOTE — Telephone Encounter (Signed)
Spoke with pt, c/o increased SOB with less exertion than normal X 2 days.  Dyspnea is worse qhs.  Pt notes soreness in middle back between shoulder blades X3-4 days.  Pt has a nonprod cough.  Wears 5lpm O2 24/7, states that his O2 levels on his pulse ox go down into the upper 70's% with exertion, but quickly rebound to the 90's% at rest.  I asked pt to increase O2 levels but states his concentrator only goes up to 5lpm.  Denies fever, chest pains, increased LE edema, wheezing, mucus production.   Pt has been taking Trelegy daily, using albuterol multiple times daily.  Requesting recs.    Pharmacy: [facility] on [address].   Sending to Dr. Silas Flood as [name] is off today.  Please advise on recs.  Thanks!

## 2020-11-25 DIAGNOSIS — H524 Presbyopia: Secondary | ICD-10-CM | POA: Diagnosis not present

## 2020-11-28 DIAGNOSIS — J439 Emphysema, unspecified: Secondary | ICD-10-CM | POA: Diagnosis not present

## 2020-11-30 ENCOUNTER — Other Ambulatory Visit: Payer: Self-pay | Admitting: Internal Medicine

## 2020-12-14 ENCOUNTER — Telehealth: Payer: Self-pay

## 2020-12-14 ENCOUNTER — Encounter: Payer: Self-pay | Admitting: Cardiology

## 2020-12-14 ENCOUNTER — Ambulatory Visit: Payer: Medicare HMO | Admitting: Cardiology

## 2020-12-14 ENCOUNTER — Telehealth (INDEPENDENT_AMBULATORY_CARE_PROVIDER_SITE_OTHER): Payer: Medicare HMO | Admitting: Cardiology

## 2020-12-14 ENCOUNTER — Telehealth: Payer: Self-pay | Admitting: Cardiology

## 2020-12-14 VITALS — BP 129/65 | HR 101 | Ht 68.0 in | Wt 153.8 lb

## 2020-12-14 DIAGNOSIS — I6522 Occlusion and stenosis of left carotid artery: Secondary | ICD-10-CM | POA: Diagnosis not present

## 2020-12-14 MED ORDER — METOPROLOL SUCCINATE ER 25 MG PO TB24: 25.0000 mg | ORAL_TABLET | Freq: Every day | ORAL | 3 refills | Status: DC

## 2020-12-14 NOTE — Patient Instructions (Addendum)
Medication Instructions:  Increase Metoprolol (Toprol XL) to 25mg  Daily *If you need a refill on your cardiac medications before your next appointment, please call your pharmacy*.  Testing/Procedures: Your physician has requested that you have a carotid duplex in MAY. This test is an ultrasound of the carotid arteries in your neck. It looks at blood flow through these arteries that supply the brain with blood. Allow one hour for this exam. There are no restrictions or special instructions.  Follow-Up: At [facility], you and your health needs are our priority.  As part of our continuing mission to provide you with exceptional heart care, we have created designated Provider Care Teams.  These Care Teams include your primary Cardiologist (physician) and Advanced Practice Providers (APPs -  Physician Assistants and Nurse Practitioners) who all work together to provide you with the care you need, when you need it.  We recommend signing up for the patient portal called "MyChart".  Sign up information is provided on this After Visit Summary.  MyChart is used to connect with patients for Virtual Visits (Telemedicine).  Patients are able to view lab/test results, encounter notes, upcoming appointments, etc.  Non-urgent messages can be sent to your provider as well.   To learn more about what you can do with MyChart, go to [email or web address].    Your next appointment:   July 2022  The format for your next appointment:   In Person  Provider:   Dr. Percival Spanish  Other Instructions See TAVR Clinic in May, have carotid dopplers done on visit day if possible. Scheduling will contact you to make these appointments.

## 2020-12-14 NOTE — Progress Notes (Signed)
Virtual Visit via Video Note   This visit type was conducted due to national recommendations for restrictions regarding the COVID-19 Pandemic (e.g. social distancing) in an effort to limit this patient's exposure and mitigate transmission in our community.  Due to his co-morbid illnesses, this patient is at least at moderate risk for complications without adequate follow up.  This format is felt to be most appropriate for this patient at this time.  All issues noted in this document were discussed and addressed.  A limited physical exam was performed with this format.  Please refer to the patient's chart for his consent to telehealth for [facility].   The entire visit was conducted via video    Date:  12/15/2020   ID:  DEWIGHT CATINO, DOB 10-Feb-1941, MRN [id number] The patient was identified using 2 identifiers.  Patient Location: Home Provider Location: Home Office  PCP:  [name], MD  Cardiologist:  Minus Breeding, MD  Electrophysiologist:  None   Evaluation Performed:  Follow-Up Visit  Chief Complaint:  Dyspnea  History of Present Illness:    Robert Dixon is a 80 y.o. male who was referred by [name], MD for evaluation of aortic stenosis.  He has non-small cell lung cancer.  He has been treated with carboplatin and paclitaxel.  He was on Imfinzi.  He has O2 dependent lung disease and emphysema reported.  He has had a thoracentesis that looked to be inflammatory.  I saw him last time because was found to have pericardial effusion and aortic stenosis on an echocardiogram.  He has well-preserved ejection fraction.  The effusion was moderate.  His aortic stenosis was severe.    Since I last saw him he completed chemo and radiation.  I reviewed the most recent CT and oncology notes for this visit.  He has had no progression of his disease.  Also he was found to have only a small pleural and pericardial effusion.   He is on chronic O2.  He does get dyspnea with exertion  but this is at baseline.  The patient denies any new symptoms such as chest discomfort, neck or arm discomfort. There has been no new shortness of breath, PND or orthopnea. There have been no reported palpitations, presyncope or syncope.   The patient does not have symptoms concerning for COVID-19 infection (fever, chills, cough, or new shortness of breath).    Past Medical History:  Diagnosis Date  . Acute on chronic respiratory failure with hypoxia ([place]) 03/16/2020  . Acute respiratory failure ([place]) 04/09/2013  . Carotid artery disease ([place]) 03/28/2020   Carotid US 02/2020:  R 100%; L 1-39%  . Chronic systolic CHF 02/72/5366   Echo 02/2020: EF 30-35, small pericardial effusion, trivial MR, status post TAVR with no PVL, mean gradient 7 mmHg  . Complication of anesthesia   . COPD (chronic obstructive pulmonary disease) ([place])   . GERD (gastroesophageal reflux disease)   . Hemoptysis 11/20/2018  . History of hiatal hernia   . Hypertension   . 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 Mediastinal adenopathy 12/06/2018  . Migraine with visual aura   . Non-small cell carcinoma of left lung, stage 3 ([place]) 12/19/2018  . Non-small cell lung cancer ([place]) dx'd 11/20/18  . Pericardial effusion 10/14/2019  . Pneumonia 11/20/2018  . PONV (postoperative nausea and vomiting)   . Primary malignant neoplasm of left upper lobe of lung ([place]) 12/12/2018  . Pulmonary nodule 11/20/2018  . 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 Severe aortic stenosis 10/14/2019  . Skin cancer    Past Surgical History:  Procedure Laterality Date  . COLONOSCOPY W/ POLYPECTOMY    . IR IMAGING GUIDED PORT INSERTION  03/14/2019  . IR THORACENTESIS ASP PLEURAL SPACE W/IMG GUIDE  03/26/2020  . RIGHT HEART CATH AND CORONARY ANGIOGRAPHY N/A 03/19/2020   Procedure: RIGHT HEART CATH AND CORONARY ANGIOGRAPHY;  Surgeon: [name], MD;  Location: [facility];  Service: Cardiovascular;  Laterality: N/A;  . TEE  WITHOUT CARDIOVERSION N/A 03/25/2020   Procedure: TRANSESOPHAGEAL ECHOCARDIOGRAM (TEE);  Surgeon: [name], MD;  Location: [place] [facility];  Service: Open Heart Surgery;  Laterality: N/A;  . TONSILLECTOMY    . TRANSCATHETER AORTIC VALVE REPLACEMENT, TRANSFEMORAL Left 03/25/2020   Procedure: TRANSCATHETER AORTIC VALVE REPLACEMENT, LEFT TRANSFEMORAL;  Surgeon: [name], MD;  Location: [place] [facility];  Service: Open Heart Surgery;  Laterality: Left;  Marland Kitchen VASECTOMY    . VIDEO BRONCHOSCOPY WITH ENDOBRONCHIAL ULTRASOUND N/A 12/06/2018   Procedure: VIDEO BRONCHOSCOPY WITH ENDOBRONCHIAL ULTRASOUND;  Surgeon: Garner Nash, DO;  Location: [facility];  Service: Thoracic;  Laterality: N/A;     Current Meds  Medication Sig  . acetaminophen (TYLENOL) 325 MG tablet Take 2 tablets (650 mg total) by mouth every 6 (six) hours as needed for mild pain (or Fever >/= 101).  Marland Kitchen albuterol (PROVENTIL) (2.5 MG/3ML) 0.083% nebulizer solution Take 3 mLs (2.5 mg total) by nebulization every 4 (four) hours.  Marland Kitchen albuterol (VENTOLIN HFA) 108 (90 Base) MCG/ACT inhaler Inhale 2 puffs into the lungs every 6 (six) hours as needed for wheezing or shortness of breath.  Marland Kitchen alum & mag hydroxide-simeth (MAALOX/MYLANTA) 200-200-20 MG/5ML suspension Take 15 mLs by mouth every 6 (six) hours as needed for indigestion or heartburn.  Marland Kitchen ascorbic acid (VITAMIN C) 500 MG tablet Take 500 mg by mouth daily.  Marland Kitchen aspirin 81 MG chewable tablet Chew 1 tablet (81 mg total) by mouth daily.  Marland Kitchen atorvastatin (LIPITOR) 40 MG tablet Take 1 tablet (40 mg total) by mouth daily.  . citalopram (CELEXA) 10 MG tablet Take 10 mg by mouth daily.  . Cyanocobalamin (VITAMIN B 12 PO) Take 1 capsule by mouth daily. 1068mcg 3 times weekly  . Fluticasone-Umeclidin-Vilant (TRELEGY ELLIPTA) 100-62.5-25 MCG/INH AEPB Inhale 1 puff into the lungs daily.  . Fluticasone-Umeclidin-Vilant (TRELEGY ELLIPTA) 100-62.5-25 MCG/INH AEPB Inhale 1 puff into the lungs daily.   . furosemide (LASIX) 20 MG tablet Take 1 tablet (20 mg total) by mouth daily.  . Multiple Vitamin (MULTIVITAMIN) tablet Take 1 tablet by mouth daily.  . pantoprazole (PROTONIX) 40 MG tablet Take 40 mg by mouth 2 (two) times daily.  . polycarbophil (FIBERCON) 625 MG tablet Take 625 mg by mouth 2 (two) times daily.   . tamsulosin (FLOMAX) 0.4 MG CAPS capsule Take 0.4 mg by mouth daily.   . TRELEGY ELLIPTA 100-62.5-25 MCG/INH AEPB Inhale 1 puff into the lungs daily.  . [DISCONTINUED] metoprolol succinate (TOPROL-XL) 25 MG 24 hr tablet Take 0.5 tablets (12.5 mg total) by mouth daily.     Allergies:   Patient has no known allergies.   Social History   Tobacco Use  . Smoking status: Former Smoker    Years: 30.00    Types: Cigarettes    Quit date: 2000    Years since quitting: 22.0  . Smokeless tobacco: Never Used  Vaping Use  . Vaping Use: Never used  Substance Use Topics  . Alcohol use: No  .  Drug use: No     Family Hx: The patient's family history includes CVA in his father; Cancer - Cervical in his sister; Cancer - Lung in his mother; Hypertension in his father.  ROS:   Please see the history of present illness.     All other systems reviewed and are negative.   Prior CV studies:   The following studies were reviewed today:  Oncology notes, CT  Labs/Other Tests and Data Reviewed:    EKG:  No ECG reviewed.  Recent Labs: 03/09/2020: TSH 2.432 03/16/2020: B Natriuretic Peptide 910.9 03/26/2020: Magnesium 2.3 10/29/2020: ALT 16; BUN 17; Creatinine 0.94; Hemoglobin 10.1; Platelet Count 218; Potassium 4.2; Sodium 142   Recent Lipid Panel Lab Results  Component Value Date/Time   CHOL 116 03/17/2020 05:26 AM   TRIG 70 03/17/2020 05:26 AM   HDL 38 (L) 03/17/2020 05:26 AM   CHOLHDL 3.1 03/17/2020 05:26 AM   LDLCALC 64 03/17/2020 05:26 AM    Wt Readings from Last 3 Encounters:  12/14/20 153 lb 12.8 oz (69.8 kg)  11/01/20 161 lb 4.8 oz (73.2 kg)  09/28/20 160 lb 11.5 oz  (72.9 kg)     Risk Assessment/Calculations:      Objective:    Vital Signs:  BP 129/65   Pulse (!) 101   Ht 5\' 8"  (1.727 m)   Wt 153 lb 12.8 oz (69.8 kg)   BMI 23.39 kg/m    VITAL SIGNS:  reviewed GEN:  no acute distress EYES:  sclerae anicteric, EOMI - Extraocular Movements Intact NEURO:  alert and oriented x 3, no obvious focal deficit PSYCH:  normal affect  ASSESSMENT & PLAN:    Status post TAVR:  He stopped his Plavix in October.  I will make sure he has follow up in the [facility].   PERICARDIAL EFFUSION:   This was small on CT.  No further imaging at this time.   CHRONIC SYSTOLIC HF: Today I will titrate his beta blocker.  I will move slowly but would suggest adding ARB or ARNI at the next visit.    PLEURAL EFFUSION:   This was stable on CT in December.  No change in therapy or further imaging.   HTN: Blood pressures are running low but I am able to titrate as above.  CAROTID STENOSIS:  He has an occluded left internal carotid artery and mild disease on the right.  This will be checked again at his visit in May. Marland Kitchen         COVID-19 Education: The signs and symptoms of COVID-19 were discussed with the patient and how to seek care for testing (follow up with PCP or arrange E-visit).  The importance of social distancing was discussed today.  Time:   Today, I have spent 25 minutes with the patient with telehealth technology discussing the above problems.     Medication Adjustments/Labs and Tests Ordered: Current medicines are reviewed at length with the patient today.  Concerns regarding medicines are outlined above.   Tests Ordered: Orders Placed This Encounter  Procedures  . VAS US CAROTID    Medication Changes: Meds ordered this encounter  Medications  . metoprolol succinate (TOPROL-XL) 25 MG 24 hr tablet    Sig: Take 1 tablet (25 mg total) by mouth daily.    Dispense:  90 tablet    Refill:  3    Follow Up:  In Person in  July with me  Signed, Minus Breeding, MD  12/15/2020 7:26 AM  [facility]

## 2020-12-14 NOTE — Telephone Encounter (Signed)
  Patient Consent for Virtual Visit         Robert Dixon has provided verbal consent on 12/14/2020 for a virtual visit (video or telephone).   CONSENT FOR VIRTUAL VISIT FOR:  Robert Dixon  By participating in this virtual visit I agree to the following:  I hereby voluntarily request, consent and authorize [facility] and its employed or contracted physicians, physician assistants, nurse practitioners or other licensed health care professionals (the Practitioner), to provide me with telemedicine health care services (the "Services") as deemed necessary by the treating Practitioner. I acknowledge and consent to receive the Services by the Practitioner via telemedicine. I understand that the telemedicine visit will involve communicating with the Practitioner through live audiovisual communication technology and the disclosure of certain medical information by electronic transmission. I acknowledge that I have been given the opportunity to request an in-person assessment or other available alternative prior to the telemedicine visit and am voluntarily participating in the telemedicine visit.  I understand that I have the right to withhold or withdraw my consent to the use of telemedicine in the course of my care at any time, without affecting my right to future care or treatment, and that the Practitioner or I may terminate the telemedicine visit at any time. I understand that I have the right to inspect all information obtained and/or recorded in the course of the telemedicine visit and may receive copies of available information for a reasonable fee.  I understand that some of the potential risks of receiving the Services via telemedicine include:  Marland Kitchen Delay or interruption in medical evaluation due to technological equipment failure or disruption; . Information transmitted may not be sufficient (e.g. poor resolution of images) to allow for appropriate medical decision making by the Practitioner;  and/or  . In rare instances, security protocols could fail, causing a breach of personal health information.  Furthermore, I acknowledge that it is my responsibility to provide information about my medical history, conditions and care that is complete and accurate to the best of my ability. I acknowledge that Practitioner's advice, recommendations, and/or decision may be based on factors not within their control, such as incomplete or inaccurate data provided by me or distortions of diagnostic images or specimens that may result from electronic transmissions. I understand that the practice of medicine is not an exact science and that Practitioner makes no warranties or guarantees regarding treatment outcomes. I acknowledge that a copy of this consent can be made available to me via my patient portal ([facility]), or I can request a printed copy by calling the office of [facility].    I understand that my insurance will be billed for this visit.   I have read or had this consent read to me. . I understand the contents of this consent, which adequately explains the benefits and risks of the Services being provided via telemedicine.  . I have been provided ample opportunity to ask questions regarding this consent and the Services and have had my questions answered to my satisfaction. . I give my informed consent for the services to be provided through the use of telemedicine in my medical care

## 2020-12-14 NOTE — Telephone Encounter (Signed)
Spoke with patient and scheduled Carotid  Dopplers on 04/07/21 at 2:00pm.  Patient voiced his understanding.Robert Dixon

## 2020-12-15 ENCOUNTER — Encounter: Payer: Self-pay | Admitting: Cardiology

## 2020-12-21 ENCOUNTER — Other Ambulatory Visit: Payer: Self-pay

## 2020-12-21 ENCOUNTER — Inpatient Hospital Stay: Payer: Medicare HMO | Attending: Internal Medicine

## 2020-12-21 DIAGNOSIS — C3432 Malignant neoplasm of lower lobe, left bronchus or lung: Secondary | ICD-10-CM | POA: Insufficient documentation

## 2020-12-21 DIAGNOSIS — Z452 Encounter for adjustment and management of vascular access device: Secondary | ICD-10-CM | POA: Diagnosis not present

## 2020-12-21 DIAGNOSIS — Z95828 Presence of other vascular implants and grafts: Secondary | ICD-10-CM

## 2020-12-21 MED ORDER — SODIUM CHLORIDE 0.9% FLUSH
10.0000 mL | INTRAVENOUS | Status: DC | PRN
  Administered 2020-12-21: 10 mL
  Filled 2020-12-21: qty 10

## 2020-12-21 MED ORDER — HEPARIN SOD (PORK) LOCK FLUSH 100 UNIT/ML IV SOLN
500.0000 [IU] | Freq: Once | INTRAVENOUS | Status: AC | PRN
  Administered 2020-12-21: 500 [IU]
  Filled 2020-12-21: qty 5

## 2020-12-23 DIAGNOSIS — C44219 Basal cell carcinoma of skin of left ear and external auricular canal: Secondary | ICD-10-CM | POA: Diagnosis not present

## 2020-12-29 DIAGNOSIS — J439 Emphysema, unspecified: Secondary | ICD-10-CM | POA: Diagnosis not present

## 2021-01-06 DIAGNOSIS — L905 Scar conditions and fibrosis of skin: Secondary | ICD-10-CM | POA: Diagnosis not present

## 2021-01-06 DIAGNOSIS — Z85828 Personal history of other malignant neoplasm of skin: Secondary | ICD-10-CM | POA: Diagnosis not present

## 2021-01-06 DIAGNOSIS — L578 Other skin changes due to chronic exposure to nonionizing radiation: Secondary | ICD-10-CM | POA: Diagnosis not present

## 2021-01-19 ENCOUNTER — Encounter: Payer: Self-pay | Admitting: Pulmonary Disease

## 2021-01-19 ENCOUNTER — Other Ambulatory Visit: Payer: Self-pay

## 2021-01-19 ENCOUNTER — Ambulatory Visit: Payer: Medicare HMO | Admitting: Pulmonary Disease

## 2021-01-19 VITALS — BP 124/68 | HR 91 | Temp 97.6°F | Ht 68.0 in | Wt 159.4 lb

## 2021-01-19 DIAGNOSIS — Z923 Personal history of irradiation: Secondary | ICD-10-CM

## 2021-01-19 DIAGNOSIS — C3492 Malignant neoplasm of unspecified part of left bronchus or lung: Secondary | ICD-10-CM

## 2021-01-19 DIAGNOSIS — J9611 Chronic respiratory failure with hypoxia: Secondary | ICD-10-CM | POA: Diagnosis not present

## 2021-01-19 DIAGNOSIS — J432 Centrilobular emphysema: Secondary | ICD-10-CM | POA: Diagnosis not present

## 2021-01-19 MED ORDER — ALBUTEROL SULFATE HFA 108 (90 BASE) MCG/ACT IN AERS: 2.0000 | INHALATION_SPRAY | Freq: Four times a day (QID) | RESPIRATORY_TRACT | 3 refills | Status: AC | PRN

## 2021-01-19 MED ORDER — TRELEGY ELLIPTA 100-62.5-25 MCG/INH IN AEPB: 1.0000 | INHALATION_SPRAY | Freq: Every day | RESPIRATORY_TRACT | 3 refills | Status: DC

## 2021-01-19 MED ORDER — TRELEGY ELLIPTA 100-62.5-25 MCG/INH IN AEPB: 1.0000 | INHALATION_SPRAY | Freq: Every day | RESPIRATORY_TRACT | 3 refills | Status: AC

## 2021-01-19 NOTE — Addendum Note (Signed)
Addended by: Elie Confer on: 01/19/2021 10:26 AM   Modules accepted: Orders

## 2021-01-19 NOTE — Patient Instructions (Addendum)
Thank you for visiting [name] at [facility]. Today we recommend the following:  Trelegy 100 mg, 90 day refills  Albuterol with 90 day refills  Return in about 6 months (around 07/19/2021) for with APP or [name].    Please do your part to reduce the spread of COVID-19.

## 2021-01-19 NOTE — Addendum Note (Signed)
Addended by: Elie Confer on: 01/19/2021 10:05 AM   Modules accepted: Orders

## 2021-01-19 NOTE — Progress Notes (Signed)
Synopsis: Referred in January 2020 for abnormal CT, lung mass, mediastinal adenopathy by [name], MD  Subjective:   PATIENT ID: Robert Dixon: male DOB: [birth year], MRN: [id number]  Chief Complaint  Patient presents with  . Follow-up    3 month follow up Pt stated that he is here to make sure he is maintaining.  He is feeling ok.     PMH of former smoker. He had a cough for several weeks, month treated with abx. He started coughing up blood.  Patient was taken to [facility] where he was treated for pneumonia.  He was given a steroid pack and antibiotics.  He was treated with bronchodilators while he was there.  CT scan imaging revealed a left infrahilar mass with endobronchial disease within the upper lobe as well as enlarged mediastinal adenopathy.  Patient was referred to our pulmonary office for evaluation of potential underlying lung cancer.  OV 06/18/2019: Since we were last seen each other in the office patient has underwent bronchoscopy back in January for diagnosis of his lung cancer.  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  He has subsequently underwent concurrent chemoradiation with weekly carboplatinum and paclitaxel and his now on consolidation immunotherapy Imfinzi every 2 weeks.  He has been seen in the office over the past month with a diagnosis of pneumonia.  He was treated for this as an outpatient and has overall recovered.  He does feel like his functional status has slowly declined.  He feels much weaker than he did before.  But has been stable over the past few weeks.  He does feel better after treatment for pneumonia.  He had follow-up CAT scan imaging which reveals radiation changes to the left lung.  At this time he is still using his oxygen continuously.  CT scan revealed radiation fibrosis.  OV 10/01/2019:Patient followed in clinic today for history of lung  cancer, undergoing immunotherapy treatments following concurrent chemoradiation.  Currently on Imfinzi.  Patient last seen by oncology on 09/23/2019.  Patient doing well.  Here today for follow-up regarding dyspnea.  He does feel more short of breath than was before.  He did have urinary symptoms with Trelegy and this was stopped.  Urinary symptoms are stable at this time no hesitancy.  He recently had CT imaging of the chest in the middle of October which revealed a loculated left side pleural effusion.  He still has significant radiation-induced fibrosis of the left lung status post his chemo XRT.  Otherwise doing well.  He states he has follow-up planned with cardiology.  Has a echocardiogram with severe aortic stenosis with an aortic valve area of 0.78 and a mean gradient of 39.  OV 11/13/2019: 80 year old gentleman currently undergoing treatments for stage IIIb adenocarcinoma.  Immunotherapy at this time with Dr. Earlie Server.  Here today for follow-up and discussion of his current symptoms.  Still has significant symptoms to include dyspnea on exertion and shortness of breath.  Was seen by cardiology Dr. Percival Spanish.  Echocardiogram with severe aortic stenosis.  Has follow-up with him planned.  Overall symptoms stable on new inhaler regimen.  He still has shortness of breath but he has less urinary symptoms and hesitancy by switching to breztri.   OV 02/18/2020: Patient here today for follow-up. Has some ongoing symptomatology. I am concerned that he lives alone. He does have significant dyspnea on exertion. At this point from a  cardiac standpoint they are going to wait his severe AS and see what his subsequent imaging follow-up from oncology looks like. I think this is reasonable. He has multifactorial etiology for his dyspnea exertion. We discussed this today in the office. He does seem little bit more depressed today but feels like he has been through a lot over the past several months.  OV 01/19/2022: New  today for follow-up.  Last seen by Dr. Earlie Server December 2021, office note reviewed.  Undergoing consolidation immunotherapy.  Last infusion on December 21, 2020.Patient had CT scan of the chest in December 2021.  Persistent left-sided effusion and small right-sided effusion with post treatment radiation changes to the left chest.  Evidence of emphysema.  Of note the patient did have the pleural effusion on the left drained by Dr. Tamala Julian this was done in November 2020.  Subsequently patient appears to have been admitted to the hospital and had interventional radiology to drain his pleural effusion in April 2021.  Both sets appear to be transudate of with lymphocyte predominance. From a respiratory standpoint he is doing well with his maintenance inhalers and as needed albuterol.  Also using oxygen continuously.  He has a caregiver that sits with him during the day.  Past Medical History:  Diagnosis Date  . Acute on chronic respiratory failure with hypoxia ([place]) 03/16/2020  . Acute respiratory failure ([place]) 04/09/2013  . Carotid artery disease ([place]) 03/28/2020   Carotid US 02/2020:  R 100%; L 1-39%  . Chronic systolic CHF 01/60/1093   Echo 02/2020: EF 30-35, small pericardial effusion, trivial MR, status post TAVR with no PVL, mean gradient 7 mmHg  . Complication of anesthesia   . COPD (chronic obstructive pulmonary disease) ([place])   . GERD (gastroesophageal reflux disease)   . Hemoptysis 11/20/2018  . History of hiatal hernia   . Hypertension   . 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 Mediastinal adenopathy 12/06/2018  . Migraine with visual aura   . Non-small cell carcinoma of left lung, stage 3 ([place]) 12/19/2018  . Non-small cell lung cancer ([place]) dx'd 11/20/18  . Pericardial effusion 10/14/2019  . Pneumonia 11/20/2018  . PONV (postoperative nausea and vomiting)   . Primary malignant neoplasm of left upper lobe of lung ([place]) 12/12/2018  . Pulmonary nodule 11/20/2018  . 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 Severe aortic stenosis 10/14/2019  . Skin cancer      Family History  Problem Relation Age of Onset  . Cancer - Lung Mother   . Hypertension Father   . CVA Father   . Cancer - Cervical Sister      Past Surgical History:  Procedure Laterality Date  . COLONOSCOPY W/ POLYPECTOMY    . IR IMAGING GUIDED PORT INSERTION  03/14/2019  . IR THORACENTESIS ASP PLEURAL SPACE W/IMG GUIDE  03/26/2020  . RIGHT HEART CATH AND CORONARY ANGIOGRAPHY N/A 03/19/2020   Procedure: RIGHT HEART CATH AND CORONARY ANGIOGRAPHY;  Surgeon: [name], MD;  Location: [facility];  Service: Cardiovascular;  Laterality: N/A;  . TEE WITHOUT CARDIOVERSION N/A 03/25/2020   Procedure: TRANSESOPHAGEAL ECHOCARDIOGRAM (TEE);  Surgeon: [name], MD;  Location: [facility];  Service: Open Heart Surgery;  Laterality: N/A;  . TONSILLECTOMY    . TRANSCATHETER AORTIC VALVE REPLACEMENT, TRANSFEMORAL Left 03/25/2020   Procedure: TRANSCATHETER AORTIC VALVE REPLACEMENT, LEFT TRANSFEMORAL;  Surgeon: [name], MD;  Location: [facility];  Service: Open Heart Surgery;  Laterality: Left;  .  VASECTOMY    . VIDEO BRONCHOSCOPY WITH ENDOBRONCHIAL ULTRASOUND N/A 12/06/2018   Procedure: VIDEO BRONCHOSCOPY WITH ENDOBRONCHIAL ULTRASOUND;  Surgeon: Garner Nash, DO;  Location: [facility];  Service: Thoracic;  Laterality: N/A;    Social History   Socioeconomic History  . Marital status: Widowed    Spouse name: Not on file  . Number of children: Not on file  . Years of education: Not on file  . Highest education level: Not on file  Occupational History  . Not on file  Tobacco Use  . Smoking status: Former Smoker    Years: 30.00    Types: Cigarettes    Quit date: 2000    Years since quitting: 22.1  . Smokeless tobacco: Never Used  Vaping Use  . Vaping Use: Never used  Substance and Sexual Activity  . Alcohol use: No  . Drug use: No  . Sexual activity: Not on file   Other Topics Concern  . Not on file  Social History Narrative   Lives alone.  Two sons.  Widower.     Social Determinants of Health   Financial Resource Strain: Not on file  Food Insecurity: Not on file  Transportation Needs: Not on file  Physical Activity: Not on file  Stress: Not on file  Social Connections: Not on file  Intimate Partner Violence: Not on file     No Known Allergies   Outpatient Medications Prior to Visit  Medication Sig Dispense Refill  . acetaminophen (TYLENOL) 325 MG tablet Take 2 tablets (650 mg total) by mouth every 6 (six) hours as needed for mild pain (or Fever >/= 101).    Marland Kitchen albuterol (PROVENTIL) (2.5 MG/3ML) 0.083% nebulizer solution Take 3 mLs (2.5 mg total) by nebulization every 4 (four) hours. 1080 mL 5  . albuterol (VENTOLIN HFA) 108 (90 Base) MCG/ACT inhaler Inhale 2 puffs into the lungs every 6 (six) hours as needed for wheezing or shortness of breath. 18 g 5  . alum & mag hydroxide-simeth (MAALOX/MYLANTA) 200-200-20 MG/5ML suspension Take 15 mLs by mouth every 6 (six) hours as needed for indigestion or heartburn. 355 mL 0  . ascorbic acid (VITAMIN C) 500 MG tablet Take 500 mg by mouth daily.    Marland Kitchen aspirin 81 MG chewable tablet Chew 1 tablet (81 mg total) by mouth daily.    Marland Kitchen atorvastatin (LIPITOR) 40 MG tablet Take 1 tablet (40 mg total) by mouth daily. 90 tablet 3  . citalopram (CELEXA) 10 MG tablet Take 10 mg by mouth daily.    . Cyanocobalamin (VITAMIN B 12 PO) Take 1 capsule by mouth daily. 1051mcg 3 times weekly    . Fluticasone-Umeclidin-Vilant (TRELEGY ELLIPTA) 100-62.5-25 MCG/INH AEPB Inhale 1 puff into the lungs daily. 28 each 0  . furosemide (LASIX) 20 MG tablet Take 1 tablet (20 mg total) by mouth daily. 90 tablet 3  . metoprolol succinate (TOPROL-XL) 25 MG 24 hr tablet Take 1 tablet (25 mg total) by mouth daily. 90 tablet 3  . Multiple Vitamin (MULTIVITAMIN) tablet Take 1 tablet by mouth daily.    . pantoprazole (PROTONIX) 40 MG tablet  Take 40 mg by mouth 2 (two) times daily.    . polycarbophil (FIBERCON) 625 MG tablet Take 625 mg by mouth 2 (two) times daily.     . tamsulosin (FLOMAX) 0.4 MG CAPS capsule Take 0.4 mg by mouth daily.     . Fluticasone-Umeclidin-Vilant (TRELEGY ELLIPTA) 100-62.5-25 MCG/INH AEPB Inhale 1 puff into the lungs daily. 60 each 0  .  TRELEGY ELLIPTA 100-62.5-25 MCG/INH AEPB Inhale 1 puff into the lungs daily. 180 each 1   Facility-Administered Medications Prior to Visit  Medication Dose Route Frequency Provider Last Rate Last Admin  . sodium chloride flush (NS) 0.9 % injection 10 mL  10 mL Intracatheter PRN [name], MD      . sodium chloride flush (NS) 0.9 % injection 10 mL  10 mL Intracatheter PRN [name], MD   10 mL at 05/20/19 1232    Review of Systems  Constitutional: Negative for chills, fever, malaise/fatigue and weight loss.  HENT: Negative for hearing loss, sore throat and tinnitus.   Eyes: Negative for blurred vision and double vision.  Respiratory: Positive for shortness of breath. Negative for cough, hemoptysis, sputum production, wheezing and stridor.   Cardiovascular: Negative for chest pain, palpitations, orthopnea, leg swelling and PND.  Gastrointestinal: Negative for abdominal pain, constipation, diarrhea, heartburn, nausea and vomiting.  Genitourinary: Negative for dysuria, hematuria and urgency.  Musculoskeletal: Negative for joint pain and myalgias.  Skin: Negative for itching and rash.  Neurological: Negative for dizziness, tingling, weakness and headaches.  Endo/Heme/Allergies: Negative for environmental allergies. Does not bruise/bleed easily.  Psychiatric/Behavioral: Negative for depression. The patient is not nervous/anxious and does not have insomnia.   All other systems reviewed and are negative.    Objective:  Physical Exam Vitals reviewed.  Constitutional:      General: He is not in acute distress.    Appearance: He is well-developed.  HENT:      Head: Normocephalic and atraumatic.     Mouth/Throat:     Mouth: Oropharynx is clear and moist.     Pharynx: No oropharyngeal exudate.  Eyes:     Extraocular Movements: EOM normal.     Conjunctiva/sclera: Conjunctivae normal.     Pupils: Pupils are equal, round, and reactive to light.  Neck:     Vascular: No JVD.     Trachea: No tracheal deviation.     Comments: Loss of supraclavicular fat Cardiovascular:     Rate and Rhythm: Normal rate and regular rhythm.     Pulses: Intact distal pulses.     Heart sounds: S1 normal and S2 normal.     Comments: Distant heart tones Pulmonary:     Effort: No tachypnea or accessory muscle usage.     Breath sounds: No stridor. Decreased breath sounds (throughout all lung fields) present. No wheezing, rhonchi or rales.     Comments: Diminished breaths in the left base.  Little air movement in the left chest compared to the right. and Increased AP chest diameter Abdominal:     General: Bowel sounds are normal. There is no distension.     Palpations: Abdomen is soft.     Tenderness: There is no abdominal tenderness.  Musculoskeletal:        General: Deformity (muscle wasting ) present. No edema.  Skin:    General: Skin is warm and dry.     Capillary Refill: Capillary refill takes less than 2 seconds.     Findings: No rash.  Neurological:     Mental Status: He is alert and oriented to person, place, and time.  Psychiatric:        Mood and Affect: Mood and affect normal.        Behavior: Behavior normal.      Vitals:   01/19/21 0931  BP: 124/68  Pulse: 91  Temp: 97.6 F (36.4 C)  TempSrc: Tympanic  SpO2: 96%  Weight: 159  lb 6 oz (72.3 kg)  Height: 5\' 8"  (1.727 m)   96% on RA BMI Readings from Last 3 Encounters:  01/19/21 24.23 kg/m  12/14/20 23.39 kg/m  11/01/20 24.53 kg/m   Wt Readings from Last 3 Encounters:  01/19/21 159 lb 6 oz (72.3 kg)  12/14/20 153 lb 12.8 oz (69.8 kg)  11/01/20 161 lb 4.8 oz (73.2 kg)    CBC     Component Value Date/Time   WBC 5.5 10/29/2020 0805   WBC 8.1 03/28/2020 0840   RBC 2.68 (L) 10/29/2020 0805   HGB 10.1 (L) 10/29/2020 0805   HCT 30.7 (L) 10/29/2020 0805   PLT 218 10/29/2020 0805   MCV 114.6 (H) 10/29/2020 0805   MCH 37.7 (H) 10/29/2020 0805   MCHC 32.9 10/29/2020 0805   RDW 14.6 10/29/2020 0805   LYMPHSABS 0.6 (L) 10/29/2020 0805   MONOABS 0.5 10/29/2020 0805   EOSABS 0.1 10/29/2020 0805   BASOSABS 0.1 10/29/2020 0805   Results for Robert Dixon, Robert D "[name]" (MRN [id number]) as of 01/19/2021 08:01  Ref. Range 10/08/2019 07:38 03/16/2020 16:06 03/26/2020 13:00  Monocyte-Macrophage-Serous Fluid Latest Ref Range: 50 - 90 % 4 (L) 8 (L) 10 (L)  Other Cells, Fluid Latest Units: % OTHER CELLS IDENTIFIED AS MESOTHELIAL CELLS OTHER CELLS IDENTIFIED AS MESOTHELIAL CELLS   Albumin, Fluid Latest Units: g/dL  1.9   Fluid Type-FALB Unknown  CYTO PLEU   Glucose, Fluid Latest Units: mg/dL 111 151   Fluid Type-FGLU Unknown PLEURAL LEFT CYTO PLEU   Fluid Type-FLDH Unknown  CYTO PLEU   LD, Fluid Latest Ref Range: 3 - 23 U/L  89 (H)   Lipase-Fluid Latest Units: U/L  5   Total protein, fluid Latest Units: g/dL 3.5 <3.0   Triglycerides, Fluid Latest Ref Range: Not Estab. mg/dL  <9   pH, Body Fluid Latest Ref Range: Not Estab.  7.8    Fluid Type-FCT Unknown PLEURAL LEFT CYTO PLEU PLEURAL  Fluid Type-FTP Unknown PLEURAL LEFT CYTO PLEU   Fluid Type-FTRIG Unknown  CYTO PLEU   Color, Fluid Unknown YELLOW YELLOW YELLOW  Total Nucleated Cell Count, Fluid Latest Ref Range: 0 - 1,000 cu mm 1,555 (H) 309 233  Lymphs, Fluid Latest Units: % 96 89 68  Eos, Fluid Latest Units: % 0    Appearance, Fluid Latest Ref Range: CLEAR  HAZY (A) CLEAR (A) CLEAR (A)  Neutrophil Count, Fluid Latest Ref Range: 0 - 25 % 0 3 22    Chest Imaging: CT chest 11/20/2018: Left hilar mass with associated adenopathy endobronchial narrowing, subcarinal and bilateral hilar adenopathy. Ill-defined small density within the  liver possible focus of metastatic disease.  CT chest July 2020: Fibrotic post radiation changes into the left chest, pleural thickening, intralobular septal thickening The patient's images have been independently reviewed by me.    CT chest October 2020: Fibrotic changes of the left lung pleural thickening interlobular septal thickening and banding consistent with radiation treatments.  Also has a loculated left-sided effusion.  Pleural effusion is larger in comparison to previous CT imaging. The patient's images have been independently reviewed by me.   CT chest 10/29/2020: Left-sided pleural effusion, small right-sided effusion.  Emphysema.  Pulmonary Functions Testing Results: PFT Results Latest Ref Rng & Units 06/21/2020  FVC-Pre L 2.75  FVC-Predicted Pre % 80  FVC-Post L 2.86  FVC-Predicted Post % 83  Pre FEV1/FVC % % 54  Post FEV1/FCV % % 50  FEV1-Pre L 1.49  FEV1-Predicted Pre % 61  FEV1-Post L 1.44  DLCO uncorrected ml/min/mmHg 4.58  DLCO UNC% % 21  DLCO corrected ml/min/mmHg 4.58  DLCO COR %Predicted % 21  DLVA Predicted % 29  TLC L 5.37  TLC % Predicted % 85  RV % Predicted % 110    FeNO: None   Pathology: None   Echocardiogram:  October 2020: Severe calcific aortic stenosis.  Please see echo report for details  Heart Catheterization: None     Assessment & Plan:     ICD-10-CM   1. Chronic respiratory failure with hypoxia (HCC)  J96.11   2. Centrilobular emphysema ([place])  J43.2   3. Non-small cell carcinoma of left lung, stage 3 (HCC)  C34.92   4. 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  Severe aortic stenosis status post TAVR.  Plan: COPD standpoint he needs refills of his  medicines 90-day supplies given today of inhalers. Continue triple therapy plus as needed albuterol. Continue follow-up with medical oncology. Encouraged him to stay active continue to eat well.    Current Outpatient Medications:  .  acetaminophen (TYLENOL) 325 MG tablet, Take 2 tablets (650 mg total) by mouth every 6 (six) hours as needed for mild pain (or Fever >/= 101)., Disp: , Rfl:  .  albuterol (PROVENTIL) (2.5 MG/3ML) 0.083% nebulizer solution, Take 3 mLs (2.5 mg total) by nebulization every 4 (four) hours., Disp: 1080 mL, Rfl: 5 .  albuterol (VENTOLIN HFA) 108 (90 Base) MCG/ACT inhaler, Inhale 2 puffs into the lungs every 6 (six) hours as needed for wheezing or shortness of breath., Disp: 18 g, Rfl: 5 .  alum & mag hydroxide-simeth (MAALOX/MYLANTA) 200-200-20 MG/5ML suspension, Take 15 mLs by mouth every 6 (six) hours as needed for indigestion or heartburn., Disp: 355 mL, Rfl: 0 .  ascorbic acid (VITAMIN C) 500 MG tablet, Take 500 mg by mouth daily., Disp: , Rfl:  .  aspirin 81 MG chewable tablet, Chew 1 tablet (81 mg total) by mouth daily., Disp: , Rfl:  .  atorvastatin (LIPITOR) 40 MG tablet, Take 1 tablet (40 mg total) by mouth daily., Disp: 90 tablet, Rfl: 3 .  citalopram (CELEXA) 10 MG tablet, Take 10 mg by mouth daily., Disp: , Rfl:  .  Cyanocobalamin (VITAMIN B 12 PO), Take 1 capsule by mouth daily. 1050mcg 3 times weekly, Disp: , Rfl:  .  Fluticasone-Umeclidin-Vilant (TRELEGY ELLIPTA) 100-62.5-25 MCG/INH AEPB, Inhale 1 puff into the lungs daily., Disp: 28 each, Rfl: 0 .  furosemide (LASIX) 20 MG tablet, Take 1 tablet (20 mg total) by mouth daily., Disp: 90 tablet, Rfl: 3 .  metoprolol succinate (TOPROL-XL) 25 MG 24 hr tablet, Take 1 tablet (25 mg total) by mouth daily., Disp: 90 tablet, Rfl: 3 .  Multiple Vitamin (MULTIVITAMIN) tablet, Take 1 tablet by mouth daily., Disp: , Rfl:  .  pantoprazole (PROTONIX) 40 MG tablet, Take 40 mg by mouth 2 (two) times daily., Disp: , Rfl:  .   polycarbophil (FIBERCON) 625 MG tablet, Take 625 mg by mouth 2 (two) times daily. , Disp: , Rfl:  .  tamsulosin (FLOMAX) 0.4 MG CAPS capsule, Take 0.4 mg by mouth daily. , Disp: , Rfl:  No current facility-administered medications for this visit.  Facility-Administered Medications Ordered in Other Visits:  .  sodium chloride flush (NS) 0.9 % injection 10 mL, 10 mL, Intracatheter, PRN, [name], MD .  sodium chloride flush (NS) 0.9 % injection 10 mL, 10 mL, Intracatheter, PRN, [name], MD, 10 mL at 05/20/19 1232   I spent 31 minutes dedicated to the care of this patient on the date of this encounter to include pre-visit review of records, face-to-face time with the patient discussing conditions above, post visit ordering of testing, clinical documentation with the electronic health record, making appropriate referrals as documented, and communicating necessary findings to members of the patients care team.    Garner Nash, DO [facility] 01/19/2021 9:54 AM

## 2021-01-31 ENCOUNTER — Other Ambulatory Visit: Payer: Self-pay | Admitting: Physician Assistant

## 2021-01-31 DIAGNOSIS — Z952 Presence of prosthetic heart valve: Secondary | ICD-10-CM

## 2021-02-10 ENCOUNTER — Other Ambulatory Visit: Payer: Self-pay

## 2021-02-10 ENCOUNTER — Inpatient Hospital Stay: Payer: Medicare HMO | Attending: Internal Medicine

## 2021-02-10 DIAGNOSIS — Z95828 Presence of other vascular implants and grafts: Secondary | ICD-10-CM

## 2021-02-10 DIAGNOSIS — C3432 Malignant neoplasm of lower lobe, left bronchus or lung: Secondary | ICD-10-CM | POA: Diagnosis not present

## 2021-02-10 DIAGNOSIS — Z452 Encounter for adjustment and management of vascular access device: Secondary | ICD-10-CM | POA: Diagnosis not present

## 2021-02-10 MED ORDER — SODIUM CHLORIDE 0.9% FLUSH
10.0000 mL | INTRAVENOUS | Status: DC | PRN
  Administered 2021-02-10: 10 mL
  Filled 2021-02-10: qty 10

## 2021-02-10 MED ORDER — HEPARIN SOD (PORK) LOCK FLUSH 100 UNIT/ML IV SOLN
500.0000 [IU] | Freq: Once | INTRAVENOUS | Status: AC | PRN
  Administered 2021-02-10: 500 [IU]
  Filled 2021-02-10: qty 5

## 2021-02-10 NOTE — Patient Instructions (Signed)

## 2021-03-03 DIAGNOSIS — Z0100 Encounter for examination of eyes and vision without abnormal findings: Secondary | ICD-10-CM | POA: Diagnosis not present

## 2021-03-10 ENCOUNTER — Ambulatory Visit: Payer: Medicare HMO | Admitting: Physician Assistant

## 2021-03-10 ENCOUNTER — Other Ambulatory Visit (HOSPITAL_COMMUNITY): Payer: Medicare HMO

## 2021-03-21 ENCOUNTER — Telehealth: Payer: Self-pay | Admitting: Pulmonary Disease

## 2021-03-21 NOTE — Telephone Encounter (Signed)
Rec'd FMLA paperwork from patient son Alani Lacivita- Prepared paperwork for [name] to sign when he returns to the office on 03/30/2021 -pr

## 2021-04-05 ENCOUNTER — Telehealth: Payer: Self-pay | Admitting: Internal Medicine

## 2021-04-05 DIAGNOSIS — Z0289 Encounter for other administrative examinations: Secondary | ICD-10-CM

## 2021-04-05 NOTE — Telephone Encounter (Signed)
Rec'd form back from [name] - sent email to Kathlee Nations to drop charge -pr

## 2021-04-05 NOTE — Telephone Encounter (Signed)
R/s per provider request, pt aware.

## 2021-04-06 NOTE — Telephone Encounter (Signed)
Spoke to patient son [name]- paid fee over the phone - faxed forms to [place] at [phone]

## 2021-04-07 ENCOUNTER — Other Ambulatory Visit: Payer: Self-pay | Admitting: Cardiology

## 2021-04-07 ENCOUNTER — Ambulatory Visit (HOSPITAL_COMMUNITY)
Admission: RE | Admit: 2021-04-07 | Discharge: 2021-04-07 | Disposition: A | Discharge status: Home / Self Care | Payer: Medicare HMO | Source: Ambulatory Visit | Attending: Cardiology | Admitting: Cardiology

## 2021-04-07 ENCOUNTER — Other Ambulatory Visit: Payer: Self-pay

## 2021-04-07 DIAGNOSIS — I6521 Occlusion and stenosis of right carotid artery: Secondary | ICD-10-CM

## 2021-04-07 DIAGNOSIS — I6522 Occlusion and stenosis of left carotid artery: Secondary | ICD-10-CM | POA: Insufficient documentation

## 2021-04-12 ENCOUNTER — Ambulatory Visit (HOSPITAL_COMMUNITY): Payer: Medicare HMO | Attending: Cardiology

## 2021-04-12 ENCOUNTER — Other Ambulatory Visit: Payer: Self-pay

## 2021-04-12 ENCOUNTER — Inpatient Hospital Stay: Payer: Medicare HMO | Attending: Internal Medicine

## 2021-04-12 DIAGNOSIS — Z952 Presence of prosthetic heart valve: Secondary | ICD-10-CM | POA: Insufficient documentation

## 2021-04-12 DIAGNOSIS — Z452 Encounter for adjustment and management of vascular access device: Secondary | ICD-10-CM | POA: Insufficient documentation

## 2021-04-12 DIAGNOSIS — C3432 Malignant neoplasm of lower lobe, left bronchus or lung: Secondary | ICD-10-CM | POA: Insufficient documentation

## 2021-04-12 DIAGNOSIS — Z95828 Presence of other vascular implants and grafts: Secondary | ICD-10-CM

## 2021-04-12 LAB — ECHOCARDIOGRAM COMPLETE
AR max vel: 1.73 cm2
AV Area VTI: 1.92 cm2
AV Area mean vel: 1.71 cm2
AV Mean grad: 6.6 mmHg
AV Peak grad: 10.8 mmHg
Ao pk vel: 1.64 m/s
Area-P 1/2: 9.6 cm2
S' Lateral: 3.1 cm

## 2021-04-12 MED ORDER — SODIUM CHLORIDE 0.9% FLUSH
10.0000 mL | INTRAVENOUS | Status: DC | PRN
  Administered 2021-04-12: 10 mL
  Filled 2021-04-12: qty 10

## 2021-04-12 MED ORDER — HEPARIN SOD (PORK) LOCK FLUSH 100 UNIT/ML IV SOLN
500.0000 [IU] | Freq: Once | INTRAVENOUS | Status: AC | PRN
  Administered 2021-04-12: 500 [IU]
  Filled 2021-04-12: qty 5

## 2021-04-12 NOTE — Progress Notes (Signed)
[facility]   MULTIDISCIPLINARY HEART VALVE TEAM  Virtual Visit via Video Note   This visit type was conducted due to national recommendations for restrictions regarding the COVID-19 Pandemic (e.g. social distancing) in an effort to limit this patient's exposure and mitigate transmission in our community.  Due to his co-morbid illnesses, this patient is at least at moderate risk for complications without adequate follow up.  This format is felt to be most appropriate for this patient at this time.  All issues noted in this document were discussed and addressed.  A limited physical exam was performed with this format.  Please refer to the patient's chart for his consent to telehealth for [facility].  Video Connection Lost Video connection was lost at > 50% of the duration of this visit, at which time the remainder of the visit was completed via audio only.     Evaluation Performed:  Follow-up visit  Date:  04/13/2021   ID:  Robert Dixon, DOB 1941/01/18, MRN [id number]  Patient Location: Home Provider Location: Office/Clinic  PCP:  [name], MD  Cardiologist:  Minus Breeding, MD  Electrophysiologist:  None   Chief Complaint:  1 year s/p TAVR  History of Present Illness:    Robert Dixon is a 80 y.o. 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   Pt had a know history of aortic stenosis followed by Dr. Percival Spanish. Echo in 11/2019 showed EF 60-65%, moderate pericardial effusion, and moderate to severe AS. A fairly conservative approach to his care had been appropriately taken as an outpatient in light of stage IIIb lung cancer. He was then admitted 4/19-03/28/20 for acute respiratory failure and acute CHF with new LV dysfunction with an EF of  30-35%.  He was found to have severe AS and his cancer was felt to be stable. Cardiac catheterization demonstrated mild, diffuse non-obstructive coronary artery disease.  He was evaluated by the structural heart team and underwent successful TAVR with a 27mm Edwards Sapien 3 THV via the TF approach on 03/25/20. Post operative echo showed EF 30-35%, small stable pericardial effusion, normally functioning TAVR with a mean gradient of 7 mm Hg and no PVL. He was diuresed with IV lasix and placed on a BB due to cardiomyopathy and sinus tachycardia. He was noted to have significant left-sided pleural effusion on admission. He underwent thoracentesis on 03/16/2020 with removal of 1170 mL. His effusion was noted to be a transudate based upon pleural fluid to serum albumin ratio. Cytology demonstrated reactive mesothelial cells and no malignant cells. The patient developed recurrent bilateral pleural effusions. After undergoing TAVR, he was sent back to interventional radiology for repeat thoracentesis. He had successful thoracentesis on the left with removal of 750 mL.The effusion on the right was not significant enough to proceed with thoracentesis. He was discharged home on aspirin and plavix. 1 month echo showed EF 30-35%, normally functioning TAVR with a mean gradient of 8 mmHg and no PVL. There is a small pericardial effusion but no sign of tamponade (similar to previous studies). Also left pleural effusion.   He was seen by Dr. Percival Spanish in January via virtual medicine. He was doing well and his BB was uptitrated. Follow up carotid dopplers 04/07/21 showed known total occlusion of the right ICA.and  03-54% stenosis in LICA. Repeat study recommended in 1 year.   Today he presents via virtual medicine for follow up. No CP. He has chronic exertional dyspnea with even low levels of activities that he thinks is related to his lungs. Occasional LE edema as well as orthopnea but no PND. Weight has been stable for him.  No dizziness or syncope. No blood in stool or urine. No palpitations.   Past Medical History:  Diagnosis Date  . Acute on chronic respiratory failure with hypoxia ([place]) 03/16/2020  . Acute respiratory failure ([place]) 04/09/2013  . Carotid artery disease ([place]) 03/28/2020   Carotid US 02/2020:  R 100%; L 1-39%  . Chronic systolic CHF 65/68/1275   Echo 02/2020: EF 30-35, small pericardial effusion, trivial MR, status post TAVR with no PVL, mean gradient 7 mmHg  . Complication of anesthesia   . COPD (chronic obstructive pulmonary disease) ([place])   . GERD (gastroesophageal reflux disease)   . Hemoptysis 11/20/2018  . History of hiatal hernia   . Hypertension   . 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 Mediastinal adenopathy 12/06/2018  . Migraine with visual aura   . Non-small cell carcinoma of left lung, stage 3 ([place]) 12/19/2018  . Non-small cell lung cancer ([place]) dx'd 11/20/18  . Pericardial effusion 10/14/2019  . Pneumonia 11/20/2018  . PONV (postoperative nausea and vomiting)   . Primary malignant neoplasm of left upper lobe of lung ([place]) 12/12/2018  . Pulmonary nodule 11/20/2018  . 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 Severe aortic stenosis 10/14/2019  . Skin cancer    Past Surgical History:  Procedure Laterality Date  . COLONOSCOPY W/ POLYPECTOMY    . IR IMAGING GUIDED PORT INSERTION  03/14/2019  . IR THORACENTESIS ASP PLEURAL SPACE W/IMG GUIDE  03/26/2020  . RIGHT HEART CATH AND CORONARY ANGIOGRAPHY N/A 03/19/2020   Procedure: RIGHT HEART CATH AND CORONARY ANGIOGRAPHY;  Surgeon: [name], MD;  Location: [place] [facility];  Service: Cardiovascular;  Laterality: N/A;  . TEE WITHOUT CARDIOVERSION N/A 03/25/2020   Procedure: TRANSESOPHAGEAL ECHOCARDIOGRAM (TEE);  Surgeon: [name], MD;  Location: [place] [facility];  Service: Open Heart Surgery;  Laterality: N/A;  . TONSILLECTOMY    . TRANSCATHETER AORTIC VALVE REPLACEMENT, TRANSFEMORAL Left 03/25/2020    Procedure: TRANSCATHETER AORTIC VALVE REPLACEMENT, LEFT TRANSFEMORAL;  Surgeon: [name], MD;  Location: [place] [facility];  Service: Open Heart Surgery;  Laterality: Left;  Marland Kitchen VASECTOMY    . VIDEO BRONCHOSCOPY WITH ENDOBRONCHIAL ULTRASOUND N/A 12/06/2018   Procedure: VIDEO BRONCHOSCOPY WITH ENDOBRONCHIAL ULTRASOUND;  Surgeon: Garner Nash, DO;  Location: [facility];  Service: Thoracic;  Laterality: N/A;     Current Meds  Medication Sig  . acetaminophen (TYLENOL) 325 MG tablet Take 2 tablets (650 mg total) by mouth every 6 (six) hours as needed for mild pain (or Fever >/= 101).  Marland Kitchen albuterol (PROVENTIL) (2.5 MG/3ML) 0.083% nebulizer solution Take 3 mLs (2.5 mg total) by nebulization every 4 (four) hours.  Marland Kitchen albuterol (VENTOLIN HFA) 108 (90 Base) MCG/ACT inhaler Inhale 2 puffs into the lungs every 6 (six) hours as needed for wheezing or shortness of breath.  Marland Kitchen alum & mag hydroxide-simeth (MAALOX/MYLANTA) 200-200-20 MG/5ML suspension Take 15 mLs by mouth every 6 (six) hours as needed for indigestion or heartburn.  Marland Kitchen ascorbic acid (VITAMIN C) 500 MG tablet Take 500 mg by mouth daily.  Marland Kitchen aspirin 81 MG chewable tablet Chew 1 tablet (81 mg total) by mouth daily.  Marland Kitchen  atorvastatin (LIPITOR) 40 MG tablet Take 1 tablet (40 mg total) by mouth daily.  . citalopram (CELEXA) 10 MG tablet Take 10 mg by mouth daily.  . Cyanocobalamin (VITAMIN B 12 PO) Take 1 capsule by mouth daily. 1082mcg 3 times weekly  . Fluticasone-Umeclidin-Vilant (TRELEGY ELLIPTA) 100-62.5-25 MCG/INH AEPB Inhale 1 puff into the lungs daily.  . furosemide (LASIX) 20 MG tablet Take 1 tablet (20 mg total) by mouth daily.  . metoprolol succinate (TOPROL-XL) 25 MG 24 hr tablet Take 1 tablet (25 mg total) by mouth daily.  . Multiple Vitamin (MULTIVITAMIN) tablet Take 1 tablet by mouth daily.  . pantoprazole (PROTONIX) 40 MG tablet Take 40 mg by mouth 2 (two) times daily.  . polycarbophil (FIBERCON) 625 MG tablet Take 625 mg by mouth 2 (two)  times daily.   . tamsulosin (FLOMAX) 0.4 MG CAPS capsule Take 0.4 mg by mouth daily.      Allergies:   Patient has no known allergies.   Social History   Tobacco Use  . Smoking status: Former Smoker    Years: 30.00    Types: Cigarettes    Quit date: 2000    Years since quitting: 22.3  . Smokeless tobacco: Never Used  Vaping Use  . Vaping Use: Never used  Substance Use Topics  . Alcohol use: No  . Drug use: No     Family Hx: The patient's family history includes CVA in his father; Cancer - Cervical in his sister; Cancer - Lung in his mother; Hypertension in his father.  ROS:   Please see the history of present illness.    All other systems reviewed and are negative.   Prior CV studies:   The following studies were reviewed today:  TAVR OPERATIVE NOTE   Date of Procedure:03/25/2020  Preoperative Diagnosis:Severe Aortic Stenosis   Postoperative Diagnosis:Same   Procedure:   Transcatheter Aortic Valve Replacement - PercutaneousLeftTransfemoral Approach Edwards Sapien 3 Ultra THV (size 59mm, model # 9750TFX, serial # [id number])  Co-Surgeons:Bryan Alveria Apley, MD and [name], MD    Anesthesiologist:Chris Ermalene Postin, MD  Echocardiographer:Peter Johnsie Cancel, MD  Pre-operative Echo Findings: ? Severe aortic stenosis ? Severeleft ventricular systolic dysfunction  Post-operative Echo Findings: ? Noparavalvular leak ? Unchanged Severeleft ventricular systolic dysfunction  __________________  Echo4/30/21: IMPRESSIONS 1. Diffuse hypokinesis with septal and apical akinesis. Left ventricular  ejection fraction, by estimation, is 30 to 35%. The left ventricle has  moderately decreased function. The left ventricle demonstrates regional  wall motion abnormalities (see  scoring diagram/findings for description). The left ventricular internal   cavity size was mildly dilated. There is mild left ventricular  hypertrophy. Left ventricular diastolic parameters are indeterminate.  2. Right ventricular systolic function is normal. The right ventricular  size is normal.  3. Small pericardial effusion seen most prominantly behind RA no change  from pre implant.  4. The mitral valve is degenerative. Trivial mitral valve regurgitation.  5. Post TAVR with 26 mm Sapien 3 Ultra with no PVL. Meand gradient 7 peak  13 mmHg and AVA 2.6 cm2. Although gradients higher than implant I suspect  implant gradients too low. Valve looks excellent. The aortic valve is  tricuspid. Aortic valve  regurgitation is not visualized.   ______________________   04/21/20 IMPRESSIONS  1. Left ventricular ejection fraction, by estimation, is 30 to 35%. The  left ventricle has moderately decreased function. Left ventricular  diastolic parameters are indeterminate.  2. Right ventricular systolic function is normal. The right ventricular  size is normal.  There is normal pulmonary artery systolic pressure. The  estimated right ventricular systolic pressure is 81.1 mmHg.  3. Left atrial size was mildly dilated.  4. The mitral valve is normal in structure. Trivial mitral valve  regurgitation.  5. The inferior vena cava is normal in size with greater than 50%  respiratory variability, suggesting right atrial pressure of 3 mmHg.  6. The aortic valve has been repaired/replaced. There is a 26 mm Edwards  Sapien prosthetic, stented (TAVR) valve present in the aortic position,  Aortic valve regurgitation is not visualized. Mean gradient 8 mmHg. Echo  findings are consistent with normal  structure and function of the aortic valve prosthesis.  7. Moderate pericardial effusion. Measures up to 1.3 cm adjacent to RV  free wall. Similar to prior echoes, RA inversion is seen. There is also  significant tricuspid inflow respiratory variation. However, no RV   diastolic collapse is seen, there is not significant mitral inflow respiratory variation, and IVC is small/collapsible, which suggests no tamponade.    _____________________   Echo 04/12/2021 IMPRESSIONS  1. Small to moderate pericardial effusion; RA collapse noted but no RV diastolic collapse and IVC collapses. Compared to 04/21/20, pericardial effusion is slightly smaller in size.  2. Left ventricular ejection fraction, by estimation, is 50 to 55%. The left ventricle has low normal function. The left ventricle has no regional wall motion abnormalities. There is mild left ventricular hypertrophy. Left ventricular diastolic  parameters are consistent with Grade I diastolic dysfunction (impaired relaxation). Elevated left atrial pressure.  3. Right ventricular systolic function is normal. The right ventricular size is normal.  4. A small pericardial effusion is present.  5. The mitral valve is normal in structure. Trivial mitral valve regurgitation. No evidence of mitral stenosis. Moderate mitral annular calcification.  6. The aortic valve has been repaired/replaced. Aortic valve regurgitation is not visualized. No aortic stenosis is present. There is a 26 mm Sapien prosthetic (TAVR) valve present in the aortic position. Procedure Date: 03/25/2020.  7. The inferior vena cava is normal in size with greater than 50% respiratory variability, suggesting right atrial pressure of 3 mmHg.   Labs/Other Tests and Data Reviewed:    EKG:  No ECG reviewed.  Recent Labs: 10/29/2020: ALT 16; BUN 17; Creatinine 0.94; Hemoglobin 10.1; Platelet Count 218; Potassium 4.2; Sodium 142   Recent Lipid Panel Lab Results  Component Value Date/Time   CHOL 116 03/17/2020 05:26 AM   TRIG 70 03/17/2020 05:26 AM   HDL 38 (L) 03/17/2020 05:26 AM   CHOLHDL 3.1 03/17/2020 05:26 AM   LDLCALC 64 03/17/2020 05:26 AM    Wt Readings from Last 3 Encounters:  04/13/21 160 lb 6.4 oz (72.8 kg)  01/19/21 159 lb 6 oz (72.3  kg)  12/14/20 153 lb 12.8 oz (69.8 kg)     Objective:    Vital Signs:  Ht 5\' 8"  (1.727 m)   Wt 160 lb 6.4 oz (72.8 kg)   BMI 24.39 kg/m    Well nourished, well developed male in no acute distress.   ASSESSMENT & PLAN:    SevereLFLGAS s/p TAVR:echo 04/12/21 showed EF 50-55%, small to moderate pericardial effusion, RA collapse noted but no RV diastolic collapse and IVC collapses (compared to 04/21/20, pericardial effusion is slightly smaller in size) normally functioning TAVR with a mean gradient of 6.6 mm Hg and no PVL. The patient has NYHA class III symptoms, which are mostly pulmonary related. SBE prophylaxis discussed; he has amoxicillin. Continue on a baby aspirin  indefinitely. Continue regular follow up with Dr. Percival Spanish, he has an apt in August.   Chronic left pleural effusion: followed by pulm   Chronic systolic CHF: seems to be euvolemic. Continue on lasix 20mg  daily.   Carotid artery disease: follow up carotid dopplers 04/07/21 showed known total occlusion of the right ICA.and 55-73% stenosis in LICA. Repeat study recommended in 1 year.   HTN: Bp not taken today but he says it has been running in a good range at home.   Time:   Today, I have spent 15 minutes with the patient with telehealth technology discussing the above problems.     Medication Adjustments/Labs and Tests Ordered: Current medicines are reviewed at length with the patient today.  Concerns regarding medicines are outlined above.   Tests Ordered: No orders of the defined types were placed in this encounter.   Medication Changes: No orders of the defined types were placed in this encounter.    Disposition:  Follow up prn  Signed, Angelena Form, PA-C  04/13/2021 2:52 PM    [place]

## 2021-04-13 ENCOUNTER — Telehealth (INDEPENDENT_AMBULATORY_CARE_PROVIDER_SITE_OTHER): Payer: Medicare HMO | Admitting: Physician Assistant

## 2021-04-13 ENCOUNTER — Encounter: Payer: Self-pay | Admitting: Physician Assistant

## 2021-04-13 ENCOUNTER — Ambulatory Visit: Payer: Medicare HMO | Admitting: Physician Assistant

## 2021-04-13 VITALS — Ht 68.0 in | Wt 160.4 lb

## 2021-04-13 DIAGNOSIS — Z952 Presence of prosthetic heart valve: Secondary | ICD-10-CM | POA: Diagnosis not present

## 2021-04-13 DIAGNOSIS — I5022 Chronic systolic (congestive) heart failure: Secondary | ICD-10-CM

## 2021-04-13 DIAGNOSIS — I10 Essential (primary) hypertension: Secondary | ICD-10-CM | POA: Diagnosis not present

## 2021-04-13 DIAGNOSIS — I6529 Occlusion and stenosis of unspecified carotid artery: Secondary | ICD-10-CM

## 2021-04-13 DIAGNOSIS — J90 Pleural effusion, not elsewhere classified: Secondary | ICD-10-CM

## 2021-04-19 ENCOUNTER — Telehealth: Payer: Self-pay | Admitting: Pulmonary Disease

## 2021-04-22 NOTE — Telephone Encounter (Signed)
Re-faxed fmla forms to city [place]. Called and spoke to patient son and advised that original fax went through,but it has been refaxed. -pr

## 2021-04-27 ENCOUNTER — Other Ambulatory Visit: Payer: Self-pay | Admitting: Cardiology

## 2021-04-29 ENCOUNTER — Other Ambulatory Visit: Payer: Self-pay

## 2021-04-29 ENCOUNTER — Ambulatory Visit (HOSPITAL_COMMUNITY)
Admission: RE | Admit: 2021-04-29 | Discharge: 2021-04-29 | Disposition: A | Discharge status: Home / Self Care | Payer: Medicare HMO | Source: Ambulatory Visit | Attending: Internal Medicine | Admitting: Internal Medicine

## 2021-04-29 ENCOUNTER — Inpatient Hospital Stay: Payer: Medicare HMO | Attending: Internal Medicine

## 2021-04-29 DIAGNOSIS — I313 Pericardial effusion (noninflammatory): Secondary | ICD-10-CM | POA: Diagnosis not present

## 2021-04-29 DIAGNOSIS — J90 Pleural effusion, not elsewhere classified: Secondary | ICD-10-CM | POA: Diagnosis not present

## 2021-04-29 DIAGNOSIS — Z79899 Other long term (current) drug therapy: Secondary | ICD-10-CM | POA: Diagnosis not present

## 2021-04-29 DIAGNOSIS — R0609 Other forms of dyspnea: Secondary | ICD-10-CM | POA: Insufficient documentation

## 2021-04-29 DIAGNOSIS — I2510 Atherosclerotic heart disease of native coronary artery without angina pectoris: Secondary | ICD-10-CM | POA: Diagnosis not present

## 2021-04-29 DIAGNOSIS — J432 Centrilobular emphysema: Secondary | ICD-10-CM | POA: Diagnosis not present

## 2021-04-29 DIAGNOSIS — C3490 Malignant neoplasm of unspecified part of unspecified bronchus or lung: Secondary | ICD-10-CM | POA: Insufficient documentation

## 2021-04-29 DIAGNOSIS — Z9981 Dependence on supplemental oxygen: Secondary | ICD-10-CM | POA: Diagnosis not present

## 2021-04-29 DIAGNOSIS — I10 Essential (primary) hypertension: Secondary | ICD-10-CM | POA: Insufficient documentation

## 2021-04-29 DIAGNOSIS — Z9221 Personal history of antineoplastic chemotherapy: Secondary | ICD-10-CM | POA: Insufficient documentation

## 2021-04-29 DIAGNOSIS — Z87891 Personal history of nicotine dependence: Secondary | ICD-10-CM | POA: Diagnosis not present

## 2021-04-29 DIAGNOSIS — R0602 Shortness of breath: Secondary | ICD-10-CM | POA: Diagnosis not present

## 2021-04-29 DIAGNOSIS — Z85828 Personal history of other malignant neoplasm of skin: Secondary | ICD-10-CM | POA: Insufficient documentation

## 2021-04-29 DIAGNOSIS — C3432 Malignant neoplasm of lower lobe, left bronchus or lung: Secondary | ICD-10-CM | POA: Insufficient documentation

## 2021-04-29 DIAGNOSIS — Z923 Personal history of irradiation: Secondary | ICD-10-CM | POA: Insufficient documentation

## 2021-04-29 LAB — CMP (CANCER CENTER ONLY)
ALT: 12 U/L (ref 0–44)
AST: 16 U/L (ref 15–41)
Albumin: 3.5 g/dL (ref 3.5–5.0)
Alkaline Phosphatase: 105 U/L (ref 38–126)
Anion gap: 12 (ref 5–15)
BUN: 20 mg/dL (ref 8–23)
CO2: 23 mmol/L (ref 22–32)
Calcium: 9.2 mg/dL (ref 8.9–10.3)
Chloride: 106 mmol/L (ref 98–111)
Creatinine: 1.09 mg/dL (ref 0.61–1.24)
GFR, Estimated: >60 mL/min (ref >60)
Glucose, Bld: 99 mg/dL (ref 70–99)
Potassium: 4.3 mmol/L (ref 3.5–5.1)
Sodium: 141 mmol/L (ref 135–145)
Total Bilirubin: 0.9 mg/dL (ref 0.3–1.2)
Total Protein: 6.9 g/dL (ref 6.5–8.1)

## 2021-04-29 LAB — CBC WITH DIFFERENTIAL (CANCER CENTER ONLY)
Abs Immature Granulocytes: 0.04 10*3/uL (ref 0.00–0.07)
Basophils Absolute: 0.1 10*3/uL (ref 0.0–0.1)
Basophils Relative: 2 %
Eosinophils Absolute: 0.1 10*3/uL (ref 0.0–0.5)
Eosinophils Relative: 1 %
HCT: 30.8 % — ABNORMAL LOW (ref 39.0–52.0)
Hemoglobin: 10.1 g/dL — ABNORMAL LOW (ref 13.0–17.0)
Immature Granulocytes: 1 %
Lymphocytes Relative: 9 %
Lymphs Abs: 0.6 10*3/uL — ABNORMAL LOW (ref 0.7–4.0)
MCH: 38 pg — ABNORMAL HIGH (ref 26.0–34.0)
MCHC: 32.8 g/dL (ref 30.0–36.0)
MCV: 115.8 fL — ABNORMAL HIGH (ref 80.0–100.0)
Monocytes Absolute: 0.6 10*3/uL (ref 0.1–1.0)
Monocytes Relative: 9 %
Neutro Abs: 5 10*3/uL (ref 1.7–7.7)
Neutrophils Relative %: 78 %
Platelet Count: 288 10*3/uL (ref 150–400)
RBC: 2.66 MIL/uL — ABNORMAL LOW (ref 4.22–5.81)
RDW: 15.3 % (ref 11.5–15.5)
WBC Count: 6.4 10*3/uL (ref 4.0–10.5)
nRBC: 0 % (ref 0.0–0.2)

## 2021-05-02 ENCOUNTER — Ambulatory Visit: Payer: Medicare HMO | Admitting: Internal Medicine

## 2021-05-05 ENCOUNTER — Inpatient Hospital Stay (HOSPITAL_BASED_OUTPATIENT_CLINIC_OR_DEPARTMENT_OTHER): Payer: Medicare HMO | Admitting: Internal Medicine

## 2021-05-05 ENCOUNTER — Other Ambulatory Visit: Payer: Self-pay

## 2021-05-05 VITALS — BP 109/54 | HR 91 | Temp 97.6°F | Resp 19 | Ht 68.0 in | Wt 162.5 lb

## 2021-05-05 DIAGNOSIS — Z87891 Personal history of nicotine dependence: Secondary | ICD-10-CM | POA: Diagnosis not present

## 2021-05-05 DIAGNOSIS — Z79899 Other long term (current) drug therapy: Secondary | ICD-10-CM | POA: Diagnosis not present

## 2021-05-05 DIAGNOSIS — R0609 Other forms of dyspnea: Secondary | ICD-10-CM | POA: Diagnosis not present

## 2021-05-05 DIAGNOSIS — I10 Essential (primary) hypertension: Secondary | ICD-10-CM | POA: Diagnosis not present

## 2021-05-05 DIAGNOSIS — C3490 Malignant neoplasm of unspecified part of unspecified bronchus or lung: Secondary | ICD-10-CM | POA: Diagnosis not present

## 2021-05-05 DIAGNOSIS — J432 Centrilobular emphysema: Secondary | ICD-10-CM | POA: Diagnosis not present

## 2021-05-05 DIAGNOSIS — R0602 Shortness of breath: Secondary | ICD-10-CM | POA: Diagnosis not present

## 2021-05-05 DIAGNOSIS — Z9981 Dependence on supplemental oxygen: Secondary | ICD-10-CM | POA: Diagnosis not present

## 2021-05-05 DIAGNOSIS — Z85828 Personal history of other malignant neoplasm of skin: Secondary | ICD-10-CM | POA: Diagnosis not present

## 2021-05-05 DIAGNOSIS — C3432 Malignant neoplasm of lower lobe, left bronchus or lung: Secondary | ICD-10-CM | POA: Diagnosis not present

## 2021-05-05 NOTE — Progress Notes (Signed)
[place] [phone]   [phone]  OFFICE PROGRESS NOTE  Robert Arabian, MD [address] [place] [address]  DIAGNOSIS: Stage IIIB (T1b, N3, M0) non-small cell lung cancer favoring adenocarcinoma diagnosed in January 2020 and presented with left lower lobe pulmonary nodule in addition to bilateral hilar and subcarinal lymphadenopathy.   Molecular studies by guardant 360 showed no actionable mutations.   PRIOR THERAPY:  1) Concurrent chemoradiation with weekly carboplatin for AUC of 2 and paclitaxel 45 mg/M2. First dose February 4th, 2020. Status post 5 cycles.  2)  Consolidation immunotherapy with Imfinzi 10 mg/KG every 2 weeks, status post 26 cycles.   CURRENT THERAPY:  Observation.  INTERVAL HISTORY: Robert Dixon 80 y.o. male returns to the clinic today for follow-up visit accompanied by his daughter-in-law.  The patient is feeling fine today with no concerning complaints except for the baseline shortness of breath and he is currently on home oxygen 4 L/min.  He denied having any chest pain but has mild cough with no hemoptysis.  He denied having any fever or chills.  He has no nausea, vomiting, diarrhea or constipation.  He has no headache or visual changes.  He has no weight loss or night sweats.  He is currently on observation.  He had repeat CT scan of the chest performed recently and he is here for evaluation and discussion of his scan results.  MEDICAL HISTORY: Past Medical History:  Diagnosis Date   Acute on chronic respiratory failure with hypoxia ([place]) 03/16/2020   Acute respiratory failure ([place]) 04/09/2013   Carotid artery disease ([place]) 03/28/2020   Carotid US 02/2020:  R 100%; L 9-83%   Chronic systolic CHF 38/25/0539   Echo 02/2020: EF 30-35, small pericardial effusion, trivial MR, status post TAVR with no PVL, mean gradient 7 mmHg   Complication of anesthesia    COPD (chronic obstructive pulmonary disease) (HCC)     GERD (gastroesophageal reflux disease)    Hemoptysis 11/20/2018   History of hiatal hernia    Hypertension    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  MEDICATIONS:  Current Outpatient Medications  Medication Sig Dispense Refill   acetaminophen (TYLENOL) 325 MG tablet Take 2 tablets (650 mg total) by mouth every 6 (six) hours as needed for mild pain (or Fever >/= 101).     albuterol (PROVENTIL) (2.5 MG/3ML) 0.083% nebulizer solution Take 3 mLs (2.5 mg total) by nebulization every 4 (four) hours. 1080 mL 5   albuterol (VENTOLIN HFA) 108 (90 Base) MCG/ACT inhaler Inhale 2 puffs into the lungs every 6 (six) hours as needed for wheezing or shortness of breath. 24 g 3   alum & mag hydroxide-simeth (MAALOX/MYLANTA) 200-200-20 MG/5ML suspension Take 15 mLs by mouth every 6 (six) hours as needed for indigestion or heartburn. 355 mL 0   ascorbic acid (VITAMIN C) 500 MG tablet Take 500 mg by mouth  daily.     aspirin 81 MG chewable tablet Chew 1 tablet (81 mg total) by mouth daily.     atorvastatin (LIPITOR) 40 MG tablet Take 1 tablet (40 mg total) by mouth daily. 90 tablet 3   citalopram (CELEXA) 10 MG tablet Take 10 mg by mouth daily.     Cyanocobalamin (VITAMIN B 12 PO) Take 1 capsule by mouth daily. 1055mcg 3 times weekly     Fluticasone-Umeclidin-Vilant (TRELEGY ELLIPTA) 100-62.5-25 MCG/INH AEPB Inhale 1 puff into the lungs daily. 180 each 3   furosemide (LASIX) 20 MG  tablet TAKE 1 TABLET (20 MG TOTAL) BY MOUTH DAILY. 90 tablet 3   metoprolol succinate (TOPROL-XL) 25 MG 24 hr tablet Take 1 tablet (25 mg total) by mouth daily. 90 tablet 3   Multiple Vitamin (MULTIVITAMIN) tablet Take 1 tablet by mouth daily.     pantoprazole (PROTONIX) 40 MG tablet Take 40 mg by mouth 2 (two) times daily.     polycarbophil (FIBERCON) 625 MG tablet Take 625 mg by mouth 2 (two) times daily.      tamsulosin (FLOMAX) 0.4 MG CAPS capsule Take 0.4 mg by mouth daily.      No current facility-administered medications for this visit.   Facility-Administered Medications Ordered in Other Visits  Medication Dose Route Frequency Provider Last Rate Last Admin   sodium chloride flush (NS) 0.9 % injection 10 mL  10 mL Intracatheter PRN [name], MD       sodium chloride flush (NS) 0.9 % injection 10 mL  10 mL Intracatheter PRN [name], MD   10 mL at 05/20/19 1232    SURGICAL HISTORY:  Past Surgical History:  Procedure Laterality Date   COLONOSCOPY W/ POLYPECTOMY     IR IMAGING GUIDED PORT INSERTION  03/14/2019   IR THORACENTESIS ASP PLEURAL SPACE W/IMG GUIDE  03/26/2020   RIGHT HEART CATH AND CORONARY ANGIOGRAPHY N/A 03/19/2020   Procedure: RIGHT HEART CATH AND CORONARY ANGIOGRAPHY;  Surgeon: [name], MD;  Location: [facility];  Service: Cardiovascular;  Laterality: N/A;   TEE WITHOUT CARDIOVERSION N/A 03/25/2020   Procedure: TRANSESOPHAGEAL ECHOCARDIOGRAM (TEE);  Surgeon: [name], MD;  Location: [facility];  Service: Open Heart Surgery;  Laterality: N/A;   TONSILLECTOMY     TRANSCATHETER AORTIC VALVE REPLACEMENT, TRANSFEMORAL Left 03/25/2020   Procedure: TRANSCATHETER AORTIC VALVE REPLACEMENT, LEFT TRANSFEMORAL;  Surgeon: [name], MD;  Location: [facility];  Service: Open Heart Surgery;  Laterality: Left;   VASECTOMY     VIDEO BRONCHOSCOPY WITH ENDOBRONCHIAL ULTRASOUND N/A 12/06/2018   Procedure: VIDEO BRONCHOSCOPY WITH  ENDOBRONCHIAL ULTRASOUND;  Surgeon: Garner Nash, DO;  Location: [facility];  Service: Thoracic;  Laterality: N/A;    REVIEW OF SYSTEMS:  A comprehensive review of systems was negative except for: Respiratory: positive for cough and dyspnea on exertion   PHYSICAL EXAMINATION: General appearance: alert, cooperative, and no distress Head: Normocephalic, without obvious abnormality, atraumatic Neck: no adenopathy, no JVD, supple, symmetrical, trachea midline, and thyroid not enlarged, symmetric, no tenderness/mass/nodules Lymph nodes: Cervical, supraclavicular, and axillary nodes normal. Resp: clear to auscultation bilaterally Back: symmetric, no curvature. ROM normal. No CVA tenderness. Cardio: systolic murmur: early systolic 3/6, harsh at 2nd right intercostal space GI: soft, non-tender; bowel sounds normal; no masses,  no organomegaly Extremities: extremities normal, atraumatic, no cyanosis or edema  ECOG PERFORMANCE STATUS: 1 - Symptomatic but completely ambulatory  Blood pressure (!) 109/54, pulse 91, temperature 97.6 F (36.4 C), temperature  source Tympanic, resp. rate 19, height 5\' 8"  (1.727 m), weight 162 lb 8 oz (73.7 kg), SpO2 (!) 89 %.  LABORATORY DATA: Lab Results  Component Value Date   WBC 6.4 04/29/2021   HGB 10.1 (L) 04/29/2021   HCT 30.8 (L) 04/29/2021   MCV 115.8 (H) 04/29/2021   PLT 288 04/29/2021      Chemistry      Component Value Date/Time   NA 141 04/29/2021 1014   K 4.3 04/29/2021 1014   CL 106 04/29/2021 1014   CO2 23 04/29/2021 1014   BUN 20 04/29/2021 1014   CREATININE 1.09 04/29/2021 1014   CREATININE 1.20 (H) 08/25/2020 1140      Component Value Date/Time   CALCIUM 9.2 04/29/2021 1014   ALKPHOS 105 04/29/2021 1014   AST 16 04/29/2021 1014   ALT 12 04/29/2021 1014   BILITOT 0.9 04/29/2021 1014       RADIOGRAPHIC STUDIES: CT CHEST WO CONTRAST  Result Date: 04/29/2021 CLINICAL DATA:  Non-small-cell lung cancer.  Restaging. EXAM: CT CHEST  WITHOUT CONTRAST TECHNIQUE: Multidetector CT imaging of the chest was performed following the standard protocol without IV contrast. COMPARISON:  10/29/2020 FINDINGS: Cardiovascular: Heart size normal. Stable small pericardial effusion. Coronary artery calcification is evident. Status post AVR. Atherosclerotic calcification is noted in the wall of the thoracic aorta. Right Port-A-Cath tip is positioned in the distal SVC. Mediastinum/Nodes: No mediastinal lymphadenopathy. The esophagus has normal imaging features. There is no axillary lymphadenopathy. Lungs/Pleura: Centrilobular and paraseptal emphysema evident. Volume loss left hemithorax is similar to prior with extensive consolidative scarring in the parahilar and infrahilar left lung. Similar appearance of the moderate to large loculated left pleural effusion with subpulmonic component. Small right pleural effusion again noted. Upper Abdomen: Small hypoattenuating lesions in the liver are stable, likely benign. Exophytic lesion upper pole left kidney has been incompletely visualized measuring 3.0 cm today, similar to prior and likely a proteinaceous/hemorrhagic cyst. Musculoskeletal: No worrisome lytic or sclerotic osseous abnormality. Stable appearance T7 compression deformity. IMPRESSION: 1. Stable exam. No new or progressive findings. 2. Similar appearance of the extensive consolidative scarring in the parahilar and infrahilar left lung with moderate to large loculated left pleural effusion having a subpulmonic component. 3. Small right pleural effusion. 4. Aortic Atherosclerosis (ICD10-I70.0) and Emphysema (ICD10-J43.9). Electronically Signed   By: Misty Stanley M.D.   On: 04/29/2021 16:44   ECHOCARDIOGRAM COMPLETE  Result Date: 04/12/2021    ECHOCARDIOGRAM REPORT   Patient Name:   Robert Dixon Date of Exam: 04/12/2021 Medical Rec #:  [id number]      Height:       68.0 in Accession #:    [id number]     Weight:       159.4 lb Date of Birth:  [birth year]       BSA:          1.856 m Patient Age:    67 years       BP:           117/64 mmHg Patient Gender: M              HR:           99 bpm. Exam Location:  [address] Procedure: 2D Echo, Cardiac Doppler and Color Doppler Indications:    Z95.2 s/p TAVR  History:        Patient has prior history of Echocardiogram examinations, most  recent 04/21/2020. CHF, COPD, Carotid Disease and Kidney injury;                 Risk Factors:Hypertension.                 Aortic Valve: 26 mm Sapien prosthetic, stented (TAVR) valve is                 present in the aortic position. Procedure Date: 03/25/2020.  Sonographer:    Marygrace Drought RCS Referring Phys: [id number] [place]  1. Small to moderate pericardial effusion; RA collapse noted but no RV diastolic collapse and IVC collapses. Compared to 04/21/20, pericardial effusion is slightly smaller in size.  2. Left ventricular ejection fraction, by estimation, is 50 to 55%. The left ventricle has low normal function. The left ventricle has no regional wall motion abnormalities. There is mild left ventricular hypertrophy. Left ventricular diastolic parameters are consistent with Grade I diastolic dysfunction (impaired relaxation). Elevated left atrial pressure.  3. Right ventricular systolic function is normal. The right ventricular size is normal.  4. A small pericardial effusion is present.  5. The mitral valve is normal in structure. Trivial mitral valve regurgitation. No evidence of mitral stenosis. Moderate mitral annular calcification.  6. The aortic valve has been repaired/replaced. Aortic valve regurgitation is not visualized. No aortic stenosis is present. There is a 26 mm Sapien prosthetic (TAVR) valve present in the aortic position. Procedure Date: 03/25/2020.  7. The inferior vena cava is normal in size with greater than 50% respiratory variability, suggesting right atrial pressure of 3 mmHg. FINDINGS  Left Ventricle: Left ventricular ejection  fraction, by estimation, is 50 to 55%. The left ventricle has low normal function. The left ventricle has no regional wall motion abnormalities. The left ventricular internal cavity size was normal in size. There is mild left ventricular hypertrophy. Left ventricular diastolic parameters are consistent with Grade I diastolic dysfunction (impaired relaxation). Elevated left atrial pressure. Right Ventricle: The right ventricular size is normal.Right ventricular systolic function is normal. Left Atrium: Left atrial size was normal in size. Right Atrium: Right atrial size was normal in size. Pericardium: A small pericardial effusion is present. Mitral Valve: The mitral valve is normal in structure. Moderate mitral annular calcification. Trivial mitral valve regurgitation. No evidence of mitral valve stenosis. Tricuspid Valve: The tricuspid valve is normal in structure. Tricuspid valve regurgitation is not demonstrated. No evidence of tricuspid stenosis. Aortic Valve: The aortic valve has been repaired/replaced. Aortic valve regurgitation is not visualized. No aortic stenosis is present. Aortic valve mean gradient measures 6.6 mmHg. Aortic valve peak gradient measures 10.8 mmHg. Aortic valve area, by VTI  measures 1.92 cm. There is a 26 mm Sapien prosthetic, stented (TAVR) valve present in the aortic position. Procedure Date: 03/25/2020. Pulmonic Valve: The pulmonic valve was normal in structure. Pulmonic valve regurgitation is not visualized. No evidence of pulmonic stenosis. Aorta: The aortic root is normal in size and structure. Venous: The inferior vena cava is normal in size with greater than 50% respiratory variability, suggesting right atrial pressure of 3 mmHg.  Additional Comments: Small to moderate pericardial effusion; RA collapse noted but no RV diastolic collapse and IVC collapses. Compared to 04/21/20, pericardial effusion is slightly smaller in size.  LEFT VENTRICLE PLAX 2D LVIDd:         4.50 cm   Diastology LVIDs:         3.10 cm  LV e' medial:    4.35 cm/s LV  PW:         1.00 cm  LV E/e' medial:  31.3 LV IVS:        1.30 cm  LV e' lateral:   6.25 cm/s LVOT diam:     2.00 cm  LV E/e' lateral: 21.7 LV SV:         58 LV SV Index:   31 LVOT Area:     3.14 cm  RIGHT VENTRICLE RV Basal diam:  3.10 cm RV S prime:     10.20 cm/s TAPSE (M-mode): 1.9 cm LEFT ATRIUM             Index       RIGHT ATRIUM           Index LA diam:        5.30 cm 2.86 cm/m  RA Area:     18.90 cm LA Vol (A2C):   47.5 ml 25.60 ml/m RA Volume:   51.55 ml  27.78 ml/m LA Vol (A4C):   25.3 ml 13.63 ml/m LA Biplane Vol: 36.0 ml 19.40 ml/m  AORTIC VALVE AV Area (Vmax):    1.73 cm AV Area (Vmean):   1.71 cm AV Area (VTI):     1.92 cm AV Vmax:           164.20 cm/s AV Vmean:          117.860 cm/s AV VTI:            0.303 m AV Peak Grad:      10.8 mmHg AV Mean Grad:      6.6 mmHg LVOT Vmax:         90.50 cm/s LVOT Vmean:        64.300 cm/s LVOT VTI:          0.185 m LVOT/AV VTI ratio: 0.61  AORTA Ao Root diam: 3.00 cm Ao Asc diam:  3.40 cm MITRAL VALVE                TRICUSPID VALVE MV Area (PHT): 9.60 cm     TR Peak grad:   14.6 mmHg MV Decel Time: 79 msec      TR Vmax:        191.00 cm/s MV E velocity: 136.00 cm/s MV A velocity: 171.00 cm/s  SHUNTS MV E/A ratio:  0.80         Systemic VTI:  0.18 m                             Systemic Diam: 2.00 cm Kirk Ruths MD Electronically signed by Kirk Ruths MD Signature Date/Time: 04/12/2021/2:21:58 PM    Final    VAS US CAROTID  Result Date: 04/07/2021 Carotid Arterial Duplex Study Patient Name:  Robert Dixon  Date of Exam:   04/07/2021 Medical Rec #: [id number]       Accession #:    [id number] Date of Birth: August 10, 1941       Patient Gender: M Patient Age:   68Y Exam Location:  [facility] Procedure:      VAS US CAROTID Referring Phys: [id number] JAMES HOCHREIN --------------------------------------------------------------------------------  Indications:       Carotid artery disease, known  right ICA occlusion. Risk Factors:      Hypertension, past history of smoking. Comparison Study:  Previous carotid duplex performed at [facility] on  03/22/20 showed an occluded right ICA and LICA velocities of                    93/33 cm/sec. Performing Technologist: Mariane Masters RVT  Examination Guidelines: A complete evaluation includes B-mode imaging, spectral Doppler, color Doppler, and power Doppler as needed of all accessible portions of each vessel. Bilateral testing is considered an integral part of a complete examination. Limited examinations for reoccurring indications may be performed as noted.  Right Carotid Findings: +----------+--------+--------+--------+------------------------+--------+           PSV cm/sEDV cm/sStenosisPlaque Description      Comments +----------+--------+--------+--------+------------------------+--------+ CCA Prox  40      1                                                +----------+--------+--------+--------+------------------------+--------+ CCA Distal24      5                                                +----------+--------+--------+--------+------------------------+--------+ ICA Prox  0       0       Occludedheterogenous and diffuse         +----------+--------+--------+--------+------------------------+--------+ ICA Mid   0       0       Occluded                                 +----------+--------+--------+--------+------------------------+--------+ ICA Distal0       0       Occluded                                 +----------+--------+--------+--------+------------------------+--------+ ECA       198     16                                               +----------+--------+--------+--------+------------------------+--------+ +----------+--------+-------+----------------+-------------------+           PSV cm/sEDV cmsDescribe        Arm Pressure (mmHG)  +----------+--------+-------+----------------+-------------------+ [id number]             Multiphasic, [id number]                 +----------+--------+-------+----------------+-------------------+ +---------+--------+--+--------+-+---------+ VertebralPSV cm/s25EDV cm/s8Antegrade +---------+--------+--+--------+-+---------+  Left Carotid Findings: +----------+--------+--------+--------+------------------+--------+           PSV cm/sEDV cm/sStenosisPlaque DescriptionComments +----------+--------+--------+--------+------------------+--------+ CCA Prox  70      16                                         +----------+--------+--------+--------+------------------+--------+ CCA Distal64      23                                         +----------+--------+--------+--------+------------------+--------+ ICA Prox  81  27              heterogenous               +----------+--------+--------+--------+------------------+--------+ ICA Mid   156     52      40-59%  heterogenous               +----------+--------+--------+--------+------------------+--------+ ICA Distal66      25                                         +----------+--------+--------+--------+------------------+--------+ ECA       139     0                                          +----------+--------+--------+--------+------------------+--------+ +----------+--------+--------+----------------+-------------------+           PSV cm/sEDV cm/sDescribe        Arm Pressure (mmHG) +----------+--------+--------+----------------+-------------------+ [id number]              Multiphasic, [id number]                 +----------+--------+--------+----------------+-------------------+ +---------+--------+--+--------+--+---------+ VertebralPSV cm/s45EDV cm/s18Antegrade +---------+--------+--+--------+--+---------+ Velocities on the left may be overestimated due to contralateral occlusion.  Summary: Right  Carotid: Evidence consistent with a total occlusion of the right ICA. Left Carotid: Velocities in the left ICA are consistent with a 40-59% stenosis. Vertebrals:  Bilateral vertebral arteries demonstrate antegrade flow. Subclavians: Normal flow hemodynamics were seen in bilateral subclavian              arteries. *See table(s) above for measurements and observations. Suggest follow up study in 12 months. Electronically signed by Jenkins Rouge MD on 04/07/2021 at 5:25:23 PM.    Final      ASSESSMENT AND PLAN: This is a very pleasant 80 years old white male diagnosed with stage IIIb non-small cell lung cancer, adenocarcinoma with no actionable mutations  Completed the course of concurrent chemoradiation with weekly carboplatin and paclitaxel status post 5 cycles.  He has partial response to this treatment. The patient also completed consolidation treatment with immunotherapy with Imfinzi for 26 cycles. The patient is currently on observation and he is feeling fine today with no concerning complaints except for the baseline shortness of breath secondary to COPD and previous radiotherapy. He had repeat CT scan of the chest performed recently.  I personally and independently reviewed the scans and discussed the results with the patient and his daughter-in-law. His scan showed no concerning findings for disease progression. I recommended for the patient to continue on observation with repeat CT scan of the chest in 6 months. He was advised to call immediately if he has any concerning symptoms in the interval. The patient voices understanding of current disease status and treatment options and is in agreement with the current care plan.  All questions were answered. The patient knows to call the clinic with any problems, questions or concerns. We can certainly see the patient much sooner if necessary.  Disclaimer: This note was dictated with voice recognition software. Similar sounding words can inadvertently  be transcribed and may not be corrected upon review.

## 2021-05-06 ENCOUNTER — Encounter: Payer: Self-pay | Admitting: Internal Medicine

## 2021-05-11 ENCOUNTER — Telehealth: Payer: Self-pay | Admitting: Internal Medicine

## 2021-05-11 NOTE — Telephone Encounter (Signed)
Scheduled per los. Called and left msg. Mailed printout  °

## 2021-05-12 DIAGNOSIS — Z85828 Personal history of other malignant neoplasm of skin: Secondary | ICD-10-CM | POA: Diagnosis not present

## 2021-05-12 DIAGNOSIS — L905 Scar conditions and fibrosis of skin: Secondary | ICD-10-CM | POA: Diagnosis not present

## 2021-05-12 DIAGNOSIS — L218 Other seborrheic dermatitis: Secondary | ICD-10-CM | POA: Diagnosis not present

## 2021-05-12 DIAGNOSIS — L570 Actinic keratosis: Secondary | ICD-10-CM | POA: Diagnosis not present

## 2021-05-12 DIAGNOSIS — L819 Disorder of pigmentation, unspecified: Secondary | ICD-10-CM | POA: Diagnosis not present

## 2021-06-01 DIAGNOSIS — R338 Other retention of urine: Secondary | ICD-10-CM | POA: Diagnosis not present

## 2021-06-01 DIAGNOSIS — R3912 Poor urinary stream: Secondary | ICD-10-CM | POA: Diagnosis not present

## 2021-06-01 DIAGNOSIS — R351 Nocturia: Secondary | ICD-10-CM | POA: Diagnosis not present

## 2021-06-01 DIAGNOSIS — N401 Enlarged prostate with lower urinary tract symptoms: Secondary | ICD-10-CM | POA: Diagnosis not present

## 2021-06-01 DIAGNOSIS — R3915 Urgency of urination: Secondary | ICD-10-CM | POA: Diagnosis not present

## 2021-06-14 ENCOUNTER — Inpatient Hospital Stay: Payer: Medicare HMO | Attending: Internal Medicine

## 2021-06-14 ENCOUNTER — Other Ambulatory Visit: Payer: Self-pay

## 2021-06-14 DIAGNOSIS — C3432 Malignant neoplasm of lower lobe, left bronchus or lung: Secondary | ICD-10-CM | POA: Insufficient documentation

## 2021-06-14 DIAGNOSIS — Z95828 Presence of other vascular implants and grafts: Secondary | ICD-10-CM

## 2021-06-14 DIAGNOSIS — Z452 Encounter for adjustment and management of vascular access device: Secondary | ICD-10-CM | POA: Insufficient documentation

## 2021-06-14 DIAGNOSIS — C3490 Malignant neoplasm of unspecified part of unspecified bronchus or lung: Secondary | ICD-10-CM

## 2021-06-14 MED ORDER — SODIUM CHLORIDE 0.9% FLUSH
10.0000 mL | INTRAVENOUS | Status: DC | PRN
  Administered 2021-06-14: 10 mL
  Filled 2021-06-14: qty 10

## 2021-06-14 MED ORDER — HEPARIN SOD (PORK) LOCK FLUSH 100 UNIT/ML IV SOLN
500.0000 [IU] | Freq: Once | INTRAVENOUS | Status: AC | PRN
  Administered 2021-06-14: 500 [IU]
  Filled 2021-06-14: qty 5

## 2021-06-23 ENCOUNTER — Other Ambulatory Visit: Payer: Self-pay

## 2021-06-23 ENCOUNTER — Ambulatory Visit: Payer: Medicare HMO | Admitting: Pulmonary Disease

## 2021-06-23 ENCOUNTER — Encounter: Payer: Self-pay | Admitting: Pulmonary Disease

## 2021-06-23 VITALS — BP 102/58 | HR 91 | Ht 68.0 in | Wt 162.8 lb

## 2021-06-23 DIAGNOSIS — Z9221 Personal history of antineoplastic chemotherapy: Secondary | ICD-10-CM

## 2021-06-23 DIAGNOSIS — J449 Chronic obstructive pulmonary disease, unspecified: Secondary | ICD-10-CM | POA: Diagnosis not present

## 2021-06-23 DIAGNOSIS — J432 Centrilobular emphysema: Secondary | ICD-10-CM

## 2021-06-23 DIAGNOSIS — C3492 Malignant neoplasm of unspecified part of left bronchus or lung: Secondary | ICD-10-CM

## 2021-06-23 DIAGNOSIS — Z923 Personal history of irradiation: Secondary | ICD-10-CM | POA: Diagnosis not present

## 2021-06-23 DIAGNOSIS — J9611 Chronic respiratory failure with hypoxia: Secondary | ICD-10-CM

## 2021-06-23 NOTE — Patient Instructions (Signed)
Thank you for visiting [name] at [facility]. Today we recommend the following:  Continue 02 needs, DME supply  Please refer to pulmonary rehab.   Return in about 3 months (around 09/23/2021) for [name] .    Please do your part to reduce the spread of COVID-19.

## 2021-06-23 NOTE — Addendum Note (Signed)
Addended by: Vivia Ewing on: 06/23/2021 01:56 PM   Modules accepted: Orders

## 2021-06-23 NOTE — Progress Notes (Signed)
Synopsis: Referred in January 2020 for abnormal CT, lung mass, mediastinal adenopathy by [name], MD  Subjective:   PATIENT ID: Robert Mare GENDER: Dixon DOB: Jul 11, 1941, MRN: [id number]  Chief Complaint  Patient presents with   Follow-up    PMH of former smoker. He had a cough for several weeks, month treated with abx. He started coughing up blood.  Patient was taken to [facility] where he was treated for pneumonia.  He was given a steroid pack and antibiotics.  He was treated with bronchodilators while he was there.  CT scan imaging revealed a left infrahilar mass with endobronchial disease within the upper lobe as well as enlarged mediastinal adenopathy.  Patient was referred to our pulmonary office for evaluation of potential underlying lung cancer.  OV 06/18/2019: Since we were last seen each other in the office patient has underwent bronchoscopy back in January for diagnosis of his lung cancer.  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  He has subsequently underwent concurrent chemoradiation with weekly carboplatinum and paclitaxel and his now on consolidation immunotherapy Imfinzi every 2 weeks.  He has been seen in the office over the past month with a diagnosis of pneumonia.  He was treated for this as an outpatient and has overall recovered.  He does feel like his functional status has slowly declined.  He feels much weaker than he did before.  But has been stable over the past few weeks.  He does feel better after treatment for pneumonia.  He had follow-up CAT scan imaging which reveals radiation changes to the left lung.  At this time he is still using his oxygen continuously.  CT scan revealed radiation fibrosis.  OV 10/01/2019:Patient followed in clinic today for history of lung cancer, undergoing immunotherapy treatments following concurrent chemoradiation.  Currently on Imfinzi.   Patient last seen by oncology on 09/23/2019.  Patient doing well.  Here today for follow-up regarding dyspnea.  He does feel more short of breath than was before.  He did have urinary symptoms with Trelegy and this was stopped.  Urinary symptoms are stable at this time no hesitancy.  He recently had CT imaging of the chest in the middle of October which revealed a loculated left side pleural effusion.  He still has significant radiation-induced fibrosis of the left lung status post his chemo XRT.  Otherwise doing well.  He states he has follow-up planned with cardiology.  Has a echocardiogram with severe aortic stenosis with an aortic valve area of 0.78 and a mean gradient of 39.  OV 11/13/2019: 80 year old gentleman currently undergoing treatments for stage IIIb adenocarcinoma.  Immunotherapy at this time with Dr. Earlie Server.  Here today for follow-up and discussion of his current symptoms.  Still has significant symptoms to include dyspnea on exertion and shortness of breath.  Was seen by cardiology Dr. Percival Spanish.  Echocardiogram with severe aortic stenosis.  Has follow-up with him planned.  Overall symptoms stable on new inhaler regimen.  He still has shortness of breath but he has less urinary symptoms and hesitancy by switching to breztri.    OV 02/18/2020: Patient here today for follow-up. Has some ongoing symptomatology. I am concerned that he lives alone. He does have significant dyspnea on exertion. At this point from a cardiac standpoint they are going to wait his severe AS and see what his subsequent imaging follow-up from oncology looks like. I think this  is reasonable. He has multifactorial etiology for his dyspnea exertion. We discussed this today in the office. He does seem little bit more depressed today but feels like he has been through a lot over the past several months.  OV 01/19/2022: New today for follow-up.  Last seen by Dr. Earlie Server December 2021, office note reviewed.  Undergoing  consolidation immunotherapy.  Last infusion on December 21, 2020.Patient had CT scan of the chest in December 2021.  Persistent left-sided effusion and small right-sided effusion with post treatment radiation changes to the left chest.  Evidence of emphysema.  Of note the patient did have the pleural effusion on the left drained by Dr. Tamala Julian this was done in November 2020.  Subsequently patient appears to have been admitted to the hospital and had interventional radiology to drain his pleural effusion in April 2021.  Both sets appear to be transudate of with lymphocyte predominance. From a respiratory standpoint he is doing well with his maintenance inhalers and as needed albuterol.  Also using oxygen continuously.  He has a caregiver that sits with him during the day.  OV 06/23/2021: Here today predominantly to discuss how he is doing.  Overall feels better.  He is not as active as he was in the past.  Family states that he gets worked up and anxious at times.  We discussed all of these barriers of him trying to stay as active as possible.  From respiratory standpoint he is on 4 L continuous.  He is on an anxiety medication prescribed by primary care.  We talked about the importance of staying active today in the office.  As well as a referral again to pulmonary rehabilitation.  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   Mediastinal adenopathy  12/06/2018   Migraine with visual aura    Non-small cell carcinoma of left lung, stage 3 ([place]) 12/19/2018   Non-small cell lung cancer ([place]) dx'd 11/20/18   Pericardial effusion 10/14/2019   Pneumonia 11/20/2018   PONV (postoperative nausea and vomiting)    Primary malignant neoplasm of left upper lobe of lung ([place]) 12/12/2018   Pulmonary nodule 11/20/2018   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   Severe aortic stenosis 10/14/2019   Skin cancer      Family History  Problem Relation Age of Onset   Cancer - Lung Mother    Hypertension Father    CVA Father    Cancer - Cervical Sister      Past Surgical History:  Procedure Laterality Date   COLONOSCOPY W/ POLYPECTOMY     IR IMAGING GUIDED PORT INSERTION  03/14/2019   IR THORACENTESIS ASP PLEURAL SPACE W/IMG GUIDE  03/26/2020   RIGHT HEART CATH AND CORONARY ANGIOGRAPHY N/A 03/19/2020   Procedure: RIGHT HEART CATH AND CORONARY ANGIOGRAPHY;  Surgeon: [name], MD;  Location: [facility];  Service: Cardiovascular;  Laterality: N/A;  TEE WITHOUT CARDIOVERSION N/A 03/25/2020   Procedure: TRANSESOPHAGEAL ECHOCARDIOGRAM (TEE);  Surgeon: [name], MD;  Location: [facility];  Service: Open Heart Surgery;  Laterality: N/A;   TONSILLECTOMY     TRANSCATHETER AORTIC VALVE REPLACEMENT, TRANSFEMORAL Left 03/25/2020   Procedure: TRANSCATHETER AORTIC VALVE REPLACEMENT, LEFT TRANSFEMORAL;  Surgeon: [name], MD;  Location: [facility];  Service: Open Heart Surgery;  Laterality: Left;   VASECTOMY     VIDEO BRONCHOSCOPY WITH ENDOBRONCHIAL ULTRASOUND N/A 12/06/2018   Procedure: VIDEO BRONCHOSCOPY WITH ENDOBRONCHIAL ULTRASOUND;  Surgeon: Garner Nash, DO;  Location: [facility];  Service: Thoracic;  Laterality: N/A;    Social History   Socioeconomic History   Marital status: Widowed    Spouse name: Not on file   Number of children: Not on file   Years of education: Not on file   Highest  education level: Not on file  Occupational History   Not on file  Tobacco Use   Smoking status: Former    Years: 30.00    Types: Cigarettes    Start date: 87    Quit date: 2000    Years since quitting: 22.5   Smokeless tobacco: Never  Vaping Use   Vaping Use: Never used  Substance and Sexual Activity   Alcohol use: No   Drug use: No   Sexual activity: Not on file  Other Topics Concern   Not on file  Social History Narrative   Lives alone.  Two sons.  Widower.     Social Determinants of Health   Financial Resource Strain: Not on file  Food Insecurity: Not on file  Transportation Needs: Not on file  Physical Activity: Not on file  Stress: Not on file  Social Connections: Not on file  Intimate Partner Violence: Not on file     No Known Allergies   Outpatient Medications Prior to Visit  Medication Sig Dispense Refill   acetaminophen (TYLENOL) 325 MG tablet Take 2 tablets (650 mg total) by mouth every 6 (six) hours as needed for mild pain (or Fever >/= 101).     albuterol (PROVENTIL) (2.5 MG/3ML) 0.083% nebulizer solution Take 3 mLs (2.5 mg total) by nebulization every 4 (four) hours. 1080 mL 5   albuterol (VENTOLIN HFA) 108 (90 Base) MCG/ACT inhaler Inhale 2 puffs into the lungs every 6 (six) hours as needed for wheezing or shortness of breath. 24 g 3   alum & mag hydroxide-simeth (MAALOX/MYLANTA) 200-200-20 MG/5ML suspension Take 15 mLs by mouth every 6 (six) hours as needed for indigestion or heartburn. 355 mL 0   ascorbic acid (VITAMIN C) 500 MG tablet Take 500 mg by mouth daily.     aspirin 81 MG chewable tablet Chew 1 tablet (81 mg total) by mouth daily.     citalopram (CELEXA) 10 MG tablet Take 10 mg by mouth daily.     Cyanocobalamin (VITAMIN B 12 PO) Take 1 capsule by mouth daily.     Fluticasone-Umeclidin-Vilant (TRELEGY ELLIPTA) 100-62.5-25 MCG/INH AEPB Inhale 1 puff into the lungs daily. 180 each 3   furosemide (LASIX) 20 MG tablet TAKE 1 TABLET (20 MG TOTAL) BY  MOUTH DAILY. 90 tablet 3   metoprolol succinate (TOPROL-XL) 25 MG 24 hr tablet Take 1 tablet (25 mg total) by mouth daily. 90 tablet 3   Multiple Vitamin (MULTIVITAMIN) tablet Take 1 tablet by mouth daily.     pantoprazole (PROTONIX) 40 MG tablet Take 40 mg by mouth 2 (two) times daily.  polycarbophil (FIBERCON) 625 MG tablet Take 625 mg by mouth 2 (two) times daily.      tamsulosin (FLOMAX) 0.4 MG CAPS capsule Take 0.4 mg by mouth daily.      atorvastatin (LIPITOR) 40 MG tablet Take 1 tablet (40 mg total) by mouth daily. 90 tablet 3   Facility-Administered Medications Prior to Visit  Medication Dose Route Frequency Provider Last Rate Last Admin   sodium chloride flush (NS) 0.9 % injection 10 mL  10 mL Intracatheter PRN [name], MD       sodium chloride flush (NS) 0.9 % injection 10 mL  10 mL Intracatheter PRN [name], MD   10 mL at 05/20/19 1232    Review of Systems  Constitutional:  Negative for chills, fever, malaise/fatigue and weight loss.  HENT:  Negative for hearing loss, sore throat and tinnitus.   Eyes:  Negative for blurred vision and double vision.  Respiratory:  Positive for shortness of breath. Negative for cough, hemoptysis, sputum production, wheezing and stridor.   Cardiovascular:  Negative for chest pain, palpitations, orthopnea, leg swelling and PND.  Gastrointestinal:  Negative for abdominal pain, constipation, diarrhea, heartburn, nausea and vomiting.  Genitourinary:  Negative for dysuria, hematuria and urgency.  Musculoskeletal:  Negative for joint pain and myalgias.  Skin:  Negative for itching and rash.  Neurological:  Negative for dizziness, tingling, weakness and headaches.  Endo/Heme/Allergies:  Negative for environmental allergies. Does not bruise/bleed easily.  Psychiatric/Behavioral:  Negative for depression. The patient is nervous/anxious. The patient does not have insomnia.   All other systems reviewed and are negative.   Objective:   Physical Exam Vitals reviewed.  Constitutional:      General: He is not in acute distress.    Appearance: He is well-developed.  HENT:     Head: Normocephalic and atraumatic.     Mouth/Throat:     Pharynx: No oropharyngeal exudate.  Eyes:     Conjunctiva/sclera: Conjunctivae normal.     Pupils: Pupils are equal, round, and reactive to light.  Neck:     Vascular: No JVD.     Trachea: No tracheal deviation.     Comments: Loss of supraclavicular fat Cardiovascular:     Rate and Rhythm: Normal rate and regular rhythm.     Heart sounds: S1 normal and S2 normal.     Comments: Distant heart tones Pulmonary:     Effort: No tachypnea or accessory muscle usage.     Breath sounds: No stridor. Decreased breath sounds (throughout all lung fields) present. No wheezing, rhonchi or rales.  Abdominal:     General: Bowel sounds are normal. There is no distension.     Palpations: Abdomen is soft.     Tenderness: There is no abdominal tenderness.  Musculoskeletal:        General: Deformity (muscle wasting ) present.  Skin:    General: Skin is warm and dry.     Capillary Refill: Capillary refill takes less than 2 seconds.     Findings: No rash.  Neurological:     Mental Status: He is alert and oriented to person, place, and time.  Psychiatric:        Behavior: Behavior normal.     Vitals:   06/23/21 1149  BP: (!) 102/58  Pulse: 91  SpO2: 94%  Weight: 162 lb 12.8 oz (73.8 kg)  Height: 5\' 8"  (1.727 m)   94% on RA BMI Readings from Last 3 Encounters:  06/23/21 24.75 kg/m  05/05/21 24.71  kg/m  04/13/21 24.39 kg/m   Wt Readings from Last 3 Encounters:  06/23/21 162 lb 12.8 oz (73.8 kg)  05/05/21 162 lb 8 oz (73.7 kg)  04/13/21 160 lb 6.4 oz (72.8 kg)    CBC    Component Value Date/Time   WBC 6.4 04/29/2021 1014   WBC 8.1 03/28/2020 0840   RBC 2.66 (L) 04/29/2021 1014   HGB 10.1 (L) 04/29/2021 1014   HCT 30.8 (L) 04/29/2021 1014   PLT 288 04/29/2021 1014   MCV 115.8 (H)  04/29/2021 1014   MCH 38.0 (H) 04/29/2021 1014   MCHC 32.8 04/29/2021 1014   RDW 15.3 04/29/2021 1014   LYMPHSABS 0.6 (L) 04/29/2021 1014   MONOABS 0.6 04/29/2021 1014   EOSABS 0.1 04/29/2021 1014   BASOSABS 0.1 04/29/2021 1014   Results for Lipuma, Robert D "[name]" (MRN [id number]) as of 01/19/2021 08:01  Ref. Range 10/08/2019 07:38 03/16/2020 16:06 03/26/2020 13:00  Monocyte-Macrophage-Serous Fluid Latest Ref Range: 50 - 90 % 4 (L) 8 (L) 10 (L)  Other Cells, Fluid Latest Units: % OTHER CELLS IDENTIFIED AS MESOTHELIAL CELLS OTHER CELLS IDENTIFIED AS MESOTHELIAL CELLS   Albumin, Fluid Latest Units: g/dL  1.9   Fluid Type-FALB Unknown  CYTO PLEU   Glucose, Fluid Latest Units: mg/dL 111 151   Fluid Type-FGLU Unknown PLEURAL LEFT CYTO PLEU   Fluid Type-FLDH Unknown  CYTO PLEU   LD, Fluid Latest Ref Range: 3 - 23 U/L  89 (H)   Lipase-Fluid Latest Units: U/L  5   Total protein, fluid Latest Units: g/dL 3.5 <3.0   Triglycerides, Fluid Latest Ref Range: Not Estab. mg/dL  <9   pH, Body Fluid Latest Ref Range: Not Estab.  7.8    Fluid Type-FCT Unknown PLEURAL LEFT CYTO PLEU PLEURAL  Fluid Type-FTP Unknown PLEURAL LEFT CYTO PLEU   Fluid Type-FTRIG Unknown  CYTO PLEU   Color, Fluid Unknown YELLOW YELLOW YELLOW  Total Nucleated Cell Count, Fluid Latest Ref Range: 0 - 1,000 cu mm 1,555 (H) 309 233  Lymphs, Fluid Latest Units: % 96 89 68  Eos, Fluid Latest Units: % 0    Appearance, Fluid Latest Ref Range: CLEAR  HAZY (A) CLEAR (A) CLEAR (A)  Neutrophil Count, Fluid Latest Ref Range: 0 - 25 % 0 3 22    Chest Imaging: CT chest 11/20/2018: Left hilar mass with associated adenopathy endobronchial narrowing, subcarinal and bilateral hilar adenopathy. Ill-defined small density within the liver possible focus of metastatic disease.  CT chest July 2020: Fibrotic post radiation changes into the left chest, pleural thickening, intralobular septal thickening The patient's images have been independently  reviewed by me.    CT chest October 2020: Fibrotic changes of the left lung pleural thickening interlobular septal thickening and banding consistent with radiation treatments.  Also has a loculated left-sided effusion.  Pleural effusion is larger in comparison to previous CT imaging. The patient's images have been independently reviewed by me.   CT chest 10/29/2020: Left-sided pleural effusion, small right-sided effusion.  Emphysema.  Pulmonary Functions Testing Results: PFT Results Latest Ref Rng & Units 06/21/2020  FVC-Pre L 2.75  FVC-Predicted Pre % 80  FVC-Post L 2.86  FVC-Predicted Post % 83  Pre FEV1/FVC % % 54  Post FEV1/FCV % % 50  FEV1-Pre L 1.49  FEV1-Predicted Pre % 61  FEV1-Post L 1.44  DLCO uncorrected ml/min/mmHg 4.58  DLCO UNC% % 21  DLCO corrected ml/min/mmHg 4.58  DLCO COR %Predicted % 21  DLVA Predicted % 29  TLC L 5.37  TLC % Predicted % 85  RV % Predicted % 110    FeNO: None   Pathology: None   Echocardiogram:  October 2020: Severe calcific aortic stenosis.  Please see echo report for details  Heart Catheterization: None     Assessment & Plan:     ICD-10-CM   1. Chronic respiratory failure with hypoxia (HCC)  J96.11     2. Centrilobular emphysema ([place])  J43.2     3. Non-small cell carcinoma of left lung, stage 3 (HCC)  C34.92     4. S/P radiation therapy > 12 wks ago  Z92.3     5. S/P chemotherapy, time since greater than 12 weeks  Z92.21     6. Chronic obstructive pulmonary disease, unspecified COPD type ([place])  J44.9        Discussion:  This is an 80 year old gentleman, history of stage III non-small cell lung cancer, significant centrilobular emphysema, chronic hypoxemic respiratory failure, radiation-induced pulmonary fibrosis and a recurrent left-sided pleural effusion.  Severe aortic stenosis status post TAVR.  Multifactorial etiology for his chronic hypoxemic respiratory failure  Plan: Continue COPD meds Triple therapy inhaler  regimen plus albuterol Lots of counseling today on him to stay as active as possible.  Majority of the time the office visit today spent counseling with the patient, patient's daughter-in-law and caregiver. Would recommend referral again to pulmonary rehabilitation. Overall he is doing very well after everything has been through this past couple years.  Follow-up with [place] in 3 months    Current Outpatient Medications:    acetaminophen (TYLENOL) 325 MG tablet, Take 2 tablets (650 mg total) by mouth every 6 (six) hours as needed for mild pain (or Fever >/= 101)., Disp: , Rfl:    albuterol (PROVENTIL) (2.5 MG/3ML) 0.083% nebulizer solution, Take 3 mLs (2.5 mg total) by nebulization every 4 (four) hours., Disp: 1080 mL, Rfl: 5   albuterol (VENTOLIN HFA) 108 (90 Base) MCG/ACT inhaler, Inhale 2 puffs into the lungs every 6 (six) hours as needed for wheezing or shortness of breath., Disp: 24 g, Rfl: 3   alum & mag hydroxide-simeth (MAALOX/MYLANTA) 200-200-20 MG/5ML suspension, Take 15 mLs by mouth every 6 (six) hours as needed for indigestion or heartburn., Disp: 355 mL, Rfl: 0   ascorbic acid (VITAMIN C) 500 MG tablet, Take 500 mg by mouth daily., Disp: , Rfl:    aspirin 81 MG chewable tablet, Chew 1 tablet (81 mg total) by mouth daily., Disp: , Rfl:    citalopram (CELEXA) 10 MG tablet, Take 10 mg by mouth daily., Disp: , Rfl:    Cyanocobalamin (VITAMIN B 12 PO), Take 1 capsule by mouth daily., Disp: , Rfl:    Fluticasone-Umeclidin-Vilant (TRELEGY ELLIPTA) 100-62.5-25 MCG/INH AEPB, Inhale 1 puff into the lungs daily., Disp: 180 each, Rfl: 3   furosemide (LASIX) 20 MG tablet, TAKE 1 TABLET (20 MG TOTAL) BY MOUTH DAILY., Disp: 90 tablet, Rfl: 3   metoprolol succinate (TOPROL-XL) 25 MG 24 hr tablet, Take 1 tablet (25 mg total) by mouth daily., Disp: 90 tablet, Rfl: 3   Multiple Vitamin (MULTIVITAMIN) tablet, Take 1 tablet by mouth daily., Disp: , Rfl:    pantoprazole (PROTONIX) 40 MG tablet, Take 40 mg  by mouth 2 (two) times daily., Disp: , Rfl:    polycarbophil (FIBERCON) 625 MG tablet, Take 625 mg by mouth 2 (two) times daily. , Disp: , Rfl:    tamsulosin (FLOMAX) 0.4 MG CAPS capsule, Take 0.4 mg by mouth  daily. , Disp: , Rfl:    atorvastatin (LIPITOR) 40 MG tablet, Take 1 tablet (40 mg total) by mouth daily., Disp: 90 tablet, Rfl: 3 No current facility-administered medications for this visit.  Facility-Administered Medications Ordered in Other Visits:    sodium chloride flush (NS) 0.9 % injection 10 mL, 10 mL, Intracatheter, PRN, [name], MD   sodium chloride flush (NS) 0.9 % injection 10 mL, 10 mL, Intracatheter, PRN, [name], MD, 10 mL at 05/20/19 1232  I spent 42 minutes dedicated to the care of this patient on the date of this encounter to include pre-visit review of records, face-to-face time with the patient discussing conditions above, post visit ordering of testing, clinical documentation with the electronic health record, making appropriate referrals as documented, and communicating necessary findings to members of the patients care team.     Garner Nash, DO [facility] 06/23/2021 12:14 PM

## 2021-06-24 ENCOUNTER — Encounter (HOSPITAL_COMMUNITY): Payer: Self-pay | Admitting: *Deleted

## 2021-06-24 NOTE — Progress Notes (Signed)
Received  referral from [name] for this pt to participate in pulmonary rehab with the the diagnosis of COPD Stage 2.  Pt is well known to pulmonary rehab staff as he completed PR in 2021.Clinical review of pt follow up appt on 7/28 Pulmonary office note.  Pt with Covid Risk Score - 7. Pt appropriate for scheduling for Pulmonary rehab when able as there is a wait list for scheduling and priority is given to those patients who have not had the opportunity to participate in the past.  [name] completed 17 sessions 09/2020.  Will forward to support staff for scheduling and verification of insurance eligibility/benefits with pt consent. Cherre Huger, BSN Cardiac and Training and development officer

## 2021-06-29 ENCOUNTER — Telehealth: Payer: Self-pay | Admitting: Pulmonary Disease

## 2021-06-29 NOTE — Telephone Encounter (Signed)
I called and spoke with pt and he is aware that we have no samples of the trelegy in the office.  I advised him that I could mail him patient assistance forms and he can fill these out and bring them back to the office with the information that is needed and we can send this in for him to see if he qualifies.  Pt stated that he will do this and these forms have been placed in the mail today.  Pt stated that he has about 30 days left of thiis medication.  Will sign off for now.

## 2021-07-02 NOTE — Progress Notes (Signed)
Cardiology Office Note   Date:  07/04/2021   ID:  [name], DOB 05/23/1941, MRN [id number]  PCP:  [name], MD  Cardiologist:   Minus Breeding, MD   Chief Complaint  Patient presents with   Shortness of Breath       History of Present Illness: Robert Dixon is a 80 y.o. male who was referred by [name], MD for evaluation of aortic stenosis.  He has non-small cell lung cancer.  He has been treated with carboplatin and paclitaxel.  He was on Imfinzi.  He has O2 dependent lung disease and emphysema reported.  He has had a thoracentesis that looked to be inflammatory.  He had pericardial effusion and aortic stenosis on an echocardiogram.  He completed chemo and radiation.   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   His EF is 30 - 35%.    He had follow-up echocardiography earlier this year remains EF was up to 50 to 55%.  Normal TAVR function.  Pericardial effusion was small to moderate and probably less than previous.  I see his CT done by his oncologist and he has stable findings with only a small right pleural effusion.  He previously had chronic pleural effusion.  He is on chronic oxygen.  He has chronic lung disease.  He gets around and does have dyspnea with exertion but he is not describing any PND or orthopnea.  Has had no chest pressure, neck or arm discomfort.  He has had no weight gain or edema.   Past Medical History:  Diagnosis Date   Acute on chronic respiratory failure with hypoxia ([place]) 03/16/2020   Acute respiratory failure ([place]) 04/09/2013   Carotid artery disease ([place]) 03/28/2020   Carotid US 02/2020:  R 100%; L 2-12%   Chronic systolic CHF 24/82/5003   Echo 02/2020: EF 30-35, small pericardial effusion, trivial MR, status post TAVR with no PVL, mean gradient 7 mmHg   Complication of anesthesia    COPD (chronic obstructive pulmonary disease) (HCC)    GERD (gastroesophageal reflux disease)     Hemoptysis 11/20/2018   History of hiatal hernia    Hypertension    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   Mediastinal adenopathy 12/06/2018   Migraine with visual aura    Non-small cell carcinoma of left lung, stage 3 ([place]) 12/19/2018   Non-small cell lung cancer ([place]) dx'd 11/20/18   Pericardial effusion 10/14/2019   Pneumonia 11/20/2018   PONV (postoperative nausea and vomiting)    Primary malignant neoplasm of left upper lobe of lung ([place]) 12/12/2018   Pulmonary nodule 11/20/2018   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   Severe aortic stenosis 10/14/2019   Skin cancer     Past Surgical History:  Procedure Laterality Date   COLONOSCOPY W/ POLYPECTOMY     IR IMAGING GUIDED PORT INSERTION  03/14/2019   IR THORACENTESIS ASP PLEURAL SPACE W/IMG GUIDE  03/26/2020   RIGHT HEART CATH AND CORONARY ANGIOGRAPHY N/A 03/19/2020   Procedure: RIGHT HEART CATH AND CORONARY ANGIOGRAPHY;  Surgeon: [name], MD;  Location: [facility];  Service: Cardiovascular;  Laterality: N/A;   TEE WITHOUT CARDIOVERSION N/A 03/25/2020   Procedure: TRANSESOPHAGEAL ECHOCARDIOGRAM (TEE);  Surgeon: [name], MD;  Location: [facility];  Service: Open Heart Surgery;  Laterality: N/A;   TONSILLECTOMY     TRANSCATHETER AORTIC VALVE REPLACEMENT, TRANSFEMORAL Left 03/25/2020  Procedure: TRANSCATHETER AORTIC VALVE REPLACEMENT, LEFT TRANSFEMORAL;  Surgeon: [name], MD;  Location: [facility];  Service: Open Heart Surgery;  Laterality: Left;   VASECTOMY     VIDEO BRONCHOSCOPY WITH ENDOBRONCHIAL ULTRASOUND N/A 12/06/2018   Procedure: VIDEO BRONCHOSCOPY WITH ENDOBRONCHIAL ULTRASOUND;  Surgeon: Garner Nash, DO;  Location: [facility];  Service: Thoracic;  Laterality: N/A;     Current Outpatient Medications  Medication Sig Dispense Refill   acetaminophen (TYLENOL) 325 MG tablet Take 2 tablets (650 mg total) by mouth every 6 (six) hours as needed for mild pain (or Fever  >/= 101).     albuterol (PROVENTIL) (2.5 MG/3ML) 0.083% nebulizer solution Take 3 mLs (2.5 mg total) by nebulization every 4 (four) hours. 1080 mL 5   albuterol (VENTOLIN HFA) 108 (90 Base) MCG/ACT inhaler Inhale 2 puffs into the lungs every 6 (six) hours as needed for wheezing or shortness of breath. 24 g 3   alum & mag hydroxide-simeth (MAALOX/MYLANTA) 200-200-20 MG/5ML suspension Take 15 mLs by mouth every 6 (six) hours as needed for indigestion or heartburn. 355 mL 0   ascorbic acid (VITAMIN C) 500 MG tablet Take 500 mg by mouth daily.     aspirin 81 MG chewable tablet Chew 1 tablet (81 mg total) by mouth daily.     citalopram (CELEXA) 10 MG tablet Take 10 mg by mouth daily.     Cyanocobalamin (VITAMIN B 12 PO) Take 1 capsule by mouth daily.     Fluticasone-Umeclidin-Vilant (TRELEGY ELLIPTA) 100-62.5-25 MCG/INH AEPB Inhale 1 puff into the lungs daily. 180 each 3   furosemide (LASIX) 20 MG tablet TAKE 1 TABLET (20 MG TOTAL) BY MOUTH DAILY. 90 tablet 3   metoprolol succinate (TOPROL-XL) 25 MG 24 hr tablet Take 1 tablet (25 mg total) by mouth daily. 90 tablet 3   Multiple Vitamin (MULTIVITAMIN) tablet Take 1 tablet by mouth daily.     pantoprazole (PROTONIX) 40 MG tablet Take 40 mg by mouth 2 (two) times daily.     polycarbophil (FIBERCON) 625 MG tablet Take 625 mg by mouth 2 (two) times daily.      atorvastatin (LIPITOR) 40 MG tablet Take 1 tablet (40 mg total) by mouth daily. 90 tablet 3   silodosin (RAPAFLO) 8 MG CAPS capsule Take 8 mg by mouth daily.     tamsulosin (FLOMAX) 0.4 MG CAPS capsule Take 0.4 mg by mouth daily.  (Patient not taking: Reported on 07/04/2021)     No current facility-administered medications for this visit.   Facility-Administered Medications Ordered in Other Visits  Medication Dose Route Frequency Provider Last Rate Last Admin   sodium chloride flush (NS) 0.9 % injection 10 mL  10 mL Intracatheter PRN [name], MD       sodium chloride flush (NS) 0.9 %  injection 10 mL  10 mL Intracatheter PRN [name], MD   10 mL at 05/20/19 1232    Allergies:   Patient has no known allergies.   ROS:  Please see the history of present illness.   Otherwise, review of systems are positive for none.   All other systems are reviewed and negative.    PHYSICAL EXAM: VS:  BP 110/60 (BP Location: Left Arm, Patient Position: Sitting, Cuff Size: Normal)   Pulse 91   Ht 5\' 8"  (1.727 m)   Wt 162 lb 12.8 oz (73.8 kg)   SpO2 93%   BMI 24.75 kg/m  , BMI Body mass index is 24.75 kg/m. GENERAL:  Well appearing NECK:  No jugular venous distention, waveform within normal limits, carotid upstroke brisk and symmetric, no bruits, no thyromegaly LUNGS: Decreased breath sounds with scattered wheezing but no crackles CHEST:  Unremarkable HEART:  PMI not displaced or sustained,S1 and S2 within normal limits, no S3, no S4, no clicks, no rubs, 2 out of 6 brief apical systolic murmur, no diastolic murmurs ABD:  Flat, positive bowel sounds normal in frequency in pitch, no bruits, no rebound, no guarding, no midline pulsatile mass, no hepatomegaly, no splenomegaly EXT:  2 plus pulses throughout, no edema, no cyanosis no clubbing     EKG:  EKG is ordered today. The ekg ordered today demonstrates sinus rhythm, rate 91, axis leftward, intervals within normal limits, no acute ST-T wave changes.   Recent Labs: 04/29/2021: ALT 12; BUN 20; Creatinine 1.09; Hemoglobin 10.1; Platelet Count 288; Potassium 4.3; Sodium 141    Lipid Panel    Component Value Date/Time   CHOL 116 03/17/2020 0526   TRIG 70 03/17/2020 0526   HDL 38 (L) 03/17/2020 0526   CHOLHDL 3.1 03/17/2020 0526   VLDL 14 03/17/2020 0526   LDLCALC 64 03/17/2020 0526      Wt Readings from Last 3 Encounters:  07/04/21 162 lb 12.8 oz (73.8 kg)  06/23/21 162 lb 12.8 oz (73.8 kg)  05/05/21 162 lb 8 oz (73.7 kg)      Other studies Reviewed: Additional studies/ records that were reviewed today include:  Oncology records, echocardiography, Structural Clinic Records. Review of the above records demonstrates:  Please see elsewhere in the note.     ASSESSMENT AND PLAN:  AS s/p TAVR:      He has a stable valve replacement.  He understands endocarditis prophylaxis.  No further imaging is indicated.     Chronic pleural effusion:   This was small on the recent CT.  No change in therapy.  Ollowed by pulm    Chronic systolic CHF:    He seems to be euvolemic.  He had improvement in his ejection fraction.  No change in therapy.  Carotid artery disease:   He had moderate right-sided stenosis and chronic occlusion on the left.  He is to have follow-up in May of next year and I will arrange this.   HTN:    Blood pressures well controlled.  No change in therapy.   Current medicines are reviewed at length with the patient today.  The patient does not have concerns regarding medicines.  The following changes have been made:  no change  Labs/ tests ordered today include: None  Orders Placed This Encounter  Procedures   EKG 12-Lead      Disposition:   FU with me in 12 months   Signed, Minus Breeding, MD  07/04/2021 5:44 PM    [place]

## 2021-07-04 ENCOUNTER — Ambulatory Visit: Payer: Medicare HMO | Admitting: Cardiology

## 2021-07-04 ENCOUNTER — Other Ambulatory Visit: Payer: Self-pay

## 2021-07-04 ENCOUNTER — Encounter: Payer: Self-pay | Admitting: Cardiology

## 2021-07-04 VITALS — BP 110/60 | HR 91 | Ht 68.0 in | Wt 162.8 lb

## 2021-07-04 DIAGNOSIS — I5022 Chronic systolic (congestive) heart failure: Secondary | ICD-10-CM

## 2021-07-04 DIAGNOSIS — I350 Nonrheumatic aortic (valve) stenosis: Secondary | ICD-10-CM | POA: Diagnosis not present

## 2021-07-04 DIAGNOSIS — I779 Disorder of arteries and arterioles, unspecified: Secondary | ICD-10-CM

## 2021-07-04 DIAGNOSIS — I10 Essential (primary) hypertension: Secondary | ICD-10-CM | POA: Diagnosis not present

## 2021-07-04 NOTE — Patient Instructions (Signed)
Medication Instructions:  No changes *If you need a refill on your cardiac medications before your next appointment, please call your pharmacy*   Lab Work: None ordered If you have labs (blood work) drawn today and your tests are completely normal, you will receive your results only by: [place] (if you have MyChart) OR A paper copy in the mail If you have any lab test that is abnormal or we need to change your treatment, we will call you to review the results.   Testing/Procedures: None ordered   Follow-Up: At [facility], you and your health needs are our priority.  As part of our continuing mission to provide you with exceptional heart care, we have created designated Provider Care Teams.  These Care Teams include your primary Cardiologist (physician) and Advanced Practice Providers (APPs -  Physician Assistants and Nurse Practitioners) who all work together to provide you with the care you need, when you need it.  We recommend signing up for the patient portal called "MyChart".  Sign up information is provided on this After Visit Summary.  MyChart is used to connect with patients for Virtual Visits (Telemedicine).  Patients are able to view lab/test results, encounter notes, upcoming appointments, etc.  Non-urgent messages can be sent to your provider as well.   To learn more about what you can do with MyChart, go to [email or web address].    Your next appointment:   12 month(s)  The format for your next appointment:   In Person  Provider:   You may see Minus Breeding, MD or one of the following Advanced Practice Providers on your designated Care Team:   Rosaria Ferries, PA-C Caron Presume, PA-C Jory Sims, DNP, ANP

## 2021-07-06 ENCOUNTER — Other Ambulatory Visit: Payer: Self-pay | Admitting: Physician Assistant

## 2021-07-07 ENCOUNTER — Ambulatory Visit: Payer: Medicare HMO | Admitting: Cardiology

## 2021-07-07 DIAGNOSIS — L905 Scar conditions and fibrosis of skin: Secondary | ICD-10-CM | POA: Diagnosis not present

## 2021-07-07 DIAGNOSIS — D485 Neoplasm of uncertain behavior of skin: Secondary | ICD-10-CM | POA: Diagnosis not present

## 2021-07-07 DIAGNOSIS — C4442 Squamous cell carcinoma of skin of scalp and neck: Secondary | ICD-10-CM | POA: Diagnosis not present

## 2021-07-07 DIAGNOSIS — Z85828 Personal history of other malignant neoplasm of skin: Secondary | ICD-10-CM | POA: Diagnosis not present

## 2021-07-07 DIAGNOSIS — C44329 Squamous cell carcinoma of skin of other parts of face: Secondary | ICD-10-CM | POA: Diagnosis not present

## 2021-07-26 DIAGNOSIS — R3915 Urgency of urination: Secondary | ICD-10-CM | POA: Diagnosis not present

## 2021-07-26 DIAGNOSIS — R351 Nocturia: Secondary | ICD-10-CM | POA: Diagnosis not present

## 2021-07-26 DIAGNOSIS — N401 Enlarged prostate with lower urinary tract symptoms: Secondary | ICD-10-CM | POA: Diagnosis not present

## 2021-07-26 DIAGNOSIS — R3912 Poor urinary stream: Secondary | ICD-10-CM | POA: Diagnosis not present

## 2021-08-03 ENCOUNTER — Telehealth (HOSPITAL_COMMUNITY): Payer: Self-pay

## 2021-08-03 NOTE — Telephone Encounter (Signed)
Pt insurance is active and benefits verified through [facility]. Co-pay $10.00, DED $0.00/$0.00 met, out of pocket $3,900.00/$594.47 met, co-insurance 0%. No pre-authorization required. [employer], 08/03/21 @ 2:09PM, [facility]#[id number]   Will contact patient to see if he is interested in the Pulmonary Rehab Program.

## 2021-08-03 NOTE — Telephone Encounter (Signed)
Called and spoke with pt in regards to PR, pt stated he is not interested at this time.   Closed referral

## 2021-08-10 ENCOUNTER — Other Ambulatory Visit: Payer: Self-pay | Admitting: Family Medicine

## 2021-08-10 ENCOUNTER — Ambulatory Visit
Admission: RE | Admit: 2021-08-10 | Discharge: 2021-08-10 | Disposition: A | Discharge status: Home / Self Care | Payer: Medicare HMO | Source: Ambulatory Visit | Attending: Family Medicine | Admitting: Family Medicine

## 2021-08-10 DIAGNOSIS — I10 Essential (primary) hypertension: Secondary | ICD-10-CM | POA: Diagnosis not present

## 2021-08-10 DIAGNOSIS — Z1389 Encounter for screening for other disorder: Secondary | ICD-10-CM | POA: Diagnosis not present

## 2021-08-10 DIAGNOSIS — R0989 Other specified symptoms and signs involving the circulatory and respiratory systems: Secondary | ICD-10-CM

## 2021-08-10 DIAGNOSIS — J449 Chronic obstructive pulmonary disease, unspecified: Secondary | ICD-10-CM | POA: Diagnosis not present

## 2021-08-10 DIAGNOSIS — C3490 Malignant neoplasm of unspecified part of unspecified bronchus or lung: Secondary | ICD-10-CM | POA: Diagnosis not present

## 2021-08-10 DIAGNOSIS — Z952 Presence of prosthetic heart valve: Secondary | ICD-10-CM | POA: Diagnosis not present

## 2021-08-10 DIAGNOSIS — R059 Cough, unspecified: Secondary | ICD-10-CM

## 2021-08-10 DIAGNOSIS — D692 Other nonthrombocytopenic purpura: Secondary | ICD-10-CM | POA: Diagnosis not present

## 2021-08-10 DIAGNOSIS — J432 Centrilobular emphysema: Secondary | ICD-10-CM | POA: Diagnosis not present

## 2021-08-10 DIAGNOSIS — I700 Atherosclerosis of aorta: Secondary | ICD-10-CM | POA: Diagnosis not present

## 2021-08-10 DIAGNOSIS — Z23 Encounter for immunization: Secondary | ICD-10-CM | POA: Diagnosis not present

## 2021-08-10 DIAGNOSIS — Z0000 Encounter for general adult medical examination without abnormal findings: Secondary | ICD-10-CM | POA: Diagnosis not present

## 2021-08-10 DIAGNOSIS — R0602 Shortness of breath: Secondary | ICD-10-CM | POA: Diagnosis not present

## 2021-08-16 ENCOUNTER — Inpatient Hospital Stay: Payer: Medicare HMO | Attending: Internal Medicine

## 2021-08-16 ENCOUNTER — Other Ambulatory Visit: Payer: Self-pay

## 2021-08-16 DIAGNOSIS — C3432 Malignant neoplasm of lower lobe, left bronchus or lung: Secondary | ICD-10-CM | POA: Diagnosis not present

## 2021-08-16 DIAGNOSIS — Z452 Encounter for adjustment and management of vascular access device: Secondary | ICD-10-CM | POA: Insufficient documentation

## 2021-08-16 DIAGNOSIS — Z95828 Presence of other vascular implants and grafts: Secondary | ICD-10-CM

## 2021-08-16 MED ORDER — SODIUM CHLORIDE 0.9% FLUSH
10.0000 mL | INTRAVENOUS | Status: DC | PRN
  Administered 2021-08-16: 10 mL

## 2021-08-16 MED ORDER — HEPARIN SOD (PORK) LOCK FLUSH 100 UNIT/ML IV SOLN
500.0000 [IU] | Freq: Once | INTRAVENOUS | Status: AC | PRN
  Administered 2021-08-16: 500 [IU]

## 2021-09-06 DIAGNOSIS — D0439 Carcinoma in situ of skin of other parts of face: Secondary | ICD-10-CM | POA: Diagnosis not present

## 2021-09-06 DIAGNOSIS — D044 Carcinoma in situ of skin of scalp and neck: Secondary | ICD-10-CM | POA: Diagnosis not present

## 2021-09-10 ENCOUNTER — Other Ambulatory Visit: Payer: Self-pay | Admitting: Cardiology

## 2021-09-12 ENCOUNTER — Other Ambulatory Visit (HOSPITAL_BASED_OUTPATIENT_CLINIC_OR_DEPARTMENT_OTHER): Payer: Self-pay

## 2021-09-12 ENCOUNTER — Other Ambulatory Visit: Payer: Self-pay

## 2021-09-12 ENCOUNTER — Ambulatory Visit: Payer: Medicare HMO | Attending: Internal Medicine

## 2021-09-12 ENCOUNTER — Encounter: Payer: Self-pay | Admitting: Internal Medicine

## 2021-09-12 DIAGNOSIS — Z23 Encounter for immunization: Secondary | ICD-10-CM

## 2021-09-12 MED ORDER — PFIZER COVID-19 VAC BIVALENT 30 MCG/0.3ML IM SUSP
INTRAMUSCULAR | 0 refills | Status: AC
  Filled 2021-09-12: qty 0.3, 1d supply, fill #0

## 2021-09-12 NOTE — Progress Notes (Signed)
   Covid-19 Vaccination Clinic  Name:  Robert Dixon    MRN: [id number] DOB: July 08, 1941  09/12/2021  Mr. Robert Dixon was observed post Covid-19 immunization for 15 minutes without incident. He was provided with Vaccine Information Sheet and instruction to access the V-Safe system.   Mr. Robert Dixon was instructed to call 911 with any severe reactions post vaccine: Difficulty breathing  Swelling of face and throat  A fast heartbeat  A bad rash all over body  Dizziness and weakness

## 2021-09-20 DIAGNOSIS — L218 Other seborrheic dermatitis: Secondary | ICD-10-CM | POA: Diagnosis not present

## 2021-09-20 DIAGNOSIS — Z4801 Encounter for change or removal of surgical wound dressing: Secondary | ICD-10-CM | POA: Diagnosis not present

## 2021-09-20 DIAGNOSIS — Z48817 Encounter for surgical aftercare following surgery on the skin and subcutaneous tissue: Secondary | ICD-10-CM | POA: Diagnosis not present

## 2021-09-22 ENCOUNTER — Other Ambulatory Visit: Payer: Self-pay

## 2021-09-22 ENCOUNTER — Ambulatory Visit: Payer: Medicare HMO | Admitting: Pulmonary Disease

## 2021-09-22 ENCOUNTER — Encounter: Payer: Self-pay | Admitting: Pulmonary Disease

## 2021-09-22 VITALS — BP 104/58 | HR 96 | Temp 97.8°F | Ht 68.0 in | Wt 156.0 lb

## 2021-09-22 DIAGNOSIS — J432 Centrilobular emphysema: Secondary | ICD-10-CM | POA: Diagnosis not present

## 2021-09-22 DIAGNOSIS — J9611 Chronic respiratory failure with hypoxia: Secondary | ICD-10-CM

## 2021-09-22 MED ORDER — TRELEGY ELLIPTA 100-62.5-25 MCG/ACT IN AEPB: 1.0000 | INHALATION_SPRAY | Freq: Every day | RESPIRATORY_TRACT | 0 refills | Status: DC

## 2021-09-22 NOTE — Progress Notes (Signed)
Synopsis: Referred in January 2020 for abnormal CT, lung mass, mediastinal adenopathy by [name], MD  Subjective:   PATIENT ID: Robert Dixon GENDER: male DOB: 08-13-1941, MRN: [id number]  Chief Complaint  Patient presents with   Follow-up    PMH of former smoker. He had a cough for several weeks, month treated with abx. He started coughing up blood.  Patient was taken to [facility] where he was treated for pneumonia.  He was given a steroid pack and antibiotics.  He was treated with bronchodilators while he was there.  CT scan imaging revealed a left infrahilar mass with endobronchial disease within the upper lobe as well as enlarged mediastinal adenopathy.  Patient was referred to our pulmonary office for evaluation of potential underlying lung cancer.  OV 06/18/2019: Since we were last seen each other in the office patient has underwent bronchoscopy back in January for diagnosis of his lung cancer.  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  He has subsequently underwent concurrent chemoradiation with weekly carboplatinum and paclitaxel and his now on consolidation immunotherapy Imfinzi every 2 weeks.  He has been seen in the office over the past month with a diagnosis of pneumonia.  He was treated for this as an outpatient and has overall recovered.  He does feel like his functional status has slowly declined.  He feels much weaker than he did before.  But has been stable over the past few weeks.  He does feel better after treatment for pneumonia.  He had follow-up CAT scan imaging which reveals radiation changes to the left lung.  At this time he is still using his oxygen continuously.  CT scan revealed radiation fibrosis.  OV 10/01/2019:Patient followed in clinic today for history of lung cancer, undergoing immunotherapy treatments following concurrent chemoradiation.  Currently on Imfinzi.   Patient last seen by oncology on 09/23/2019.  Patient doing well.  Here today for follow-up regarding dyspnea.  He does feel more short of breath than was before.  He did have urinary symptoms with Trelegy and this was stopped.  Urinary symptoms are stable at this time no hesitancy.  He recently had CT imaging of the chest in the middle of October which revealed a loculated left side pleural effusion.  He still has significant radiation-induced fibrosis of the left lung status post his chemo XRT.  Otherwise doing well.  He states he has follow-up planned with cardiology.  Has a echocardiogram with severe aortic stenosis with an aortic valve area of 0.78 and a mean gradient of 39.  OV 11/13/2019: 80 year old gentleman currently undergoing treatments for stage IIIb adenocarcinoma.  Immunotherapy at this time with Dr. Earlie Server.  Here today for follow-up and discussion of his current symptoms.  Still has significant symptoms to include dyspnea on exertion and shortness of breath.  Was seen by cardiology Dr. Percival Spanish.  Echocardiogram with severe aortic stenosis.  Has follow-up with him planned.  Overall symptoms stable on new inhaler regimen.  He still has shortness of breath but he has less urinary symptoms and hesitancy by switching to breztri.    OV 02/18/2020: Patient here today for follow-up. Has some ongoing symptomatology. I am concerned that he lives alone. He does have significant dyspnea on exertion. At this point from a cardiac standpoint they are going to wait his severe AS and see what his subsequent imaging follow-up from oncology looks like. I think this  is reasonable. He has multifactorial etiology for his dyspnea exertion. We discussed this today in the office. He does seem little bit more depressed today but feels like he has been through a lot over the past several months.  OV 01/19/2022: New today for follow-up.  Last seen by Dr. Earlie Server December 2021, office note reviewed.  Undergoing  consolidation immunotherapy.  Last infusion on December 21, 2020.Patient had CT scan of the chest in December 2021.  Persistent left-sided effusion and small right-sided effusion with post treatment radiation changes to the left chest.  Evidence of emphysema.  Of note the patient did have the pleural effusion on the left drained by Dr. Tamala Julian this was done in November 2020.  Subsequently patient appears to have been admitted to the hospital and had interventional radiology to drain his pleural effusion in April 2021.  Both sets appear to be transudate of with lymphocyte predominance. From a respiratory standpoint he is doing well with his maintenance inhalers and as needed albuterol.  Also using oxygen continuously.  He has a caregiver that sits with him during the day.  OV 06/23/2021: Here today predominantly to discuss how he is doing.  Overall feels better.  He is not as active as he was in the past.  Family states that he gets worked up and anxious at times.  We discussed all of these barriers of him trying to stay as active as possible.  From respiratory standpoint he is on 4 L continuous.  He is on an anxiety medication prescribed by primary care.  We talked about the importance of staying active today in the office.  As well as a referral again to pulmonary rehabilitation.  OV 09/23/2021: Here today for follow-up regarding chronic hypoxemic respiratory failure, lung cancer, COPD.  Here today with his family and caregiver.  Still gets anxious at times with getting up and moving around.  Still has trouble with feeling like he has to urinate all the time.  Otherwise doing well getting up and trying to stay as active as possible.  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   Mediastinal adenopathy 12/06/2018   Migraine with visual aura    Non-small cell carcinoma of left lung, stage 3 ([place]) 12/19/2018   Non-small cell lung cancer ([place]) dx'd 11/20/18   Pericardial effusion 10/14/2019   Pneumonia 11/20/2018   PONV (postoperative nausea and vomiting)    Primary malignant neoplasm of left upper lobe of lung ([place]) 12/12/2018   Pulmonary nodule 11/20/2018   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   Severe aortic stenosis 10/14/2019   Skin cancer      Family History  Problem Relation Age of Onset   Cancer - Lung Mother    Hypertension Father    CVA Father    Cancer - Cervical Sister      Past Surgical History:  Procedure Laterality Date  COLONOSCOPY W/ POLYPECTOMY     IR IMAGING GUIDED PORT INSERTION  03/14/2019   IR THORACENTESIS ASP PLEURAL SPACE W/IMG GUIDE  03/26/2020   RIGHT HEART CATH AND CORONARY ANGIOGRAPHY N/A 03/19/2020   Procedure: RIGHT HEART CATH AND CORONARY ANGIOGRAPHY;  Surgeon: [name], MD;  Location: [facility];  Service: Cardiovascular;  Laterality: N/A;   TEE WITHOUT CARDIOVERSION N/A 03/25/2020   Procedure: TRANSESOPHAGEAL ECHOCARDIOGRAM (TEE);  Surgeon: [name], MD;  Location: [facility];  Service: Open Heart Surgery;  Laterality: N/A;   TONSILLECTOMY     TRANSCATHETER AORTIC VALVE REPLACEMENT, TRANSFEMORAL Left 03/25/2020   Procedure: TRANSCATHETER AORTIC VALVE REPLACEMENT, LEFT TRANSFEMORAL;  Surgeon: [name], MD;  Location: [facility];  Service: Open Heart Surgery;  Laterality: Left;   VASECTOMY     VIDEO BRONCHOSCOPY WITH ENDOBRONCHIAL  ULTRASOUND N/A 12/06/2018   Procedure: VIDEO BRONCHOSCOPY WITH ENDOBRONCHIAL ULTRASOUND;  Surgeon: Garner Nash, DO;  Location: [facility];  Service: Thoracic;  Laterality: N/A;    Social History   Socioeconomic History   Marital status: Widowed    Spouse name: Not on file   Number of children: Not on file   Years of education: Not on file   Highest education level: Not on file  Occupational History   Not on file  Tobacco Use   Smoking status: Former    Years: 30.00    Types: Cigarettes    Start date: 23    Quit date: 2000    Years since quitting: 22.8   Smokeless tobacco: Never  Vaping Use   Vaping Use: Never used  Substance and Sexual Activity   Alcohol use: No   Drug use: No   Sexual activity: Not on file  Other Topics Concern   Not on file  Social History Narrative   Lives alone.  Two sons.  Widower.     Social Determinants of Health   Financial Resource Strain: Not on file  Food Insecurity: Not on file  Transportation Needs: Not on file  Physical Activity: Not on file  Stress: Not on file  Social Connections: Not on file  Intimate Partner Violence: Not on file     No Known Allergies   Outpatient Medications Prior to Visit  Medication Sig Dispense Refill   acetaminophen (TYLENOL) 325 MG tablet Take 2 tablets (650 mg total) by mouth every 6 (six) hours as needed for mild pain (or Fever >/= 101).     albuterol (PROVENTIL) (2.5 MG/3ML) 0.083% nebulizer solution Take 3 mLs (2.5 mg total) by nebulization every 4 (four) hours. 1080 mL 5   albuterol (VENTOLIN HFA) 108 (90 Base) MCG/ACT inhaler Inhale 2 puffs into the lungs every 6 (six) hours as needed for wheezing or shortness of breath. 24 g 3   alum & mag hydroxide-simeth (MAALOX/MYLANTA) 200-200-20 MG/5ML suspension Take 15 mLs by mouth every 6 (six) hours as needed for indigestion or heartburn. 355 mL 0   ascorbic acid (VITAMIN C) 500 MG tablet Take 500 mg by mouth daily.     aspirin 81 MG chewable tablet Chew 1  tablet (81 mg total) by mouth daily.     atorvastatin (LIPITOR) 40 MG tablet TAKE 1 TABLET (40 MG TOTAL) BY MOUTH DAILY. 90 tablet 3   citalopram (CELEXA) 10 MG tablet Take 10 mg by mouth daily.     COVID-19 mRNA bivalent vaccine, Pfizer, (PFIZER COVID-19 VAC BIVALENT) injection Inject into the muscle. 0.3 mL 0   Cyanocobalamin (VITAMIN  B 12 PO) Take 1 capsule by mouth daily.     Fluticasone-Umeclidin-Vilant (TRELEGY ELLIPTA) 100-62.5-25 MCG/INH AEPB Inhale 1 puff into the lungs daily. 180 each 3   furosemide (LASIX) 20 MG tablet TAKE 1 TABLET (20 MG TOTAL) BY MOUTH DAILY. 90 tablet 3   metoprolol succinate (TOPROL-XL) 25 MG 24 hr tablet TAKE 1 TABLET EVERY DAY 90 tablet 3   Multiple Vitamin (MULTIVITAMIN) tablet Take 1 tablet by mouth daily.     pantoprazole (PROTONIX) 40 MG tablet Take 40 mg by mouth 2 (two) times daily.     polycarbophil (FIBERCON) 625 MG tablet Take 625 mg by mouth 2 (two) times daily.      silodosin (RAPAFLO) 8 MG CAPS capsule Take 8 mg by mouth daily.     tamsulosin (FLOMAX) 0.4 MG CAPS capsule Take 0.4 mg by mouth daily.  (Patient not taking: No sig reported)     Facility-Administered Medications Prior to Visit  Medication Dose Route Frequency Provider Last Rate Last Admin   sodium chloride flush (NS) 0.9 % injection 10 mL  10 mL Intracatheter PRN [name], MD       sodium chloride flush (NS) 0.9 % injection 10 mL  10 mL Intracatheter PRN [name], MD   10 mL at 05/20/19 1232    Review of Systems  Constitutional:  Negative for chills, fever, malaise/fatigue and weight loss.  HENT:  Negative for hearing loss, sore throat and tinnitus.   Eyes:  Negative for blurred vision and double vision.  Respiratory:  Positive for cough and shortness of breath. Negative for hemoptysis, sputum production, wheezing and stridor.   Cardiovascular:  Negative for chest pain, palpitations, orthopnea, leg swelling and PND.  Gastrointestinal:  Negative for abdominal pain,  constipation, diarrhea, heartburn, nausea and vomiting.  Genitourinary:  Negative for dysuria, hematuria and urgency.  Musculoskeletal:  Negative for joint pain and myalgias.  Skin:  Negative for itching and rash.  Neurological:  Negative for dizziness, tingling, weakness and headaches.  Endo/Heme/Allergies:  Negative for environmental allergies. Does not bruise/bleed easily.  Psychiatric/Behavioral:  Negative for depression. The patient is not nervous/anxious and does not have insomnia.   All other systems reviewed and are negative.   Objective:  Physical Exam Vitals reviewed.  Constitutional:      General: He is not in acute distress.    Appearance: He is well-developed.  HENT:     Head: Normocephalic and atraumatic.     Mouth/Throat:     Pharynx: No oropharyngeal exudate.  Eyes:     Conjunctiva/sclera: Conjunctivae normal.     Pupils: Pupils are equal, round, and reactive to light.  Neck:     Vascular: No JVD.     Trachea: No tracheal deviation.     Comments: Loss of supraclavicular fat Cardiovascular:     Rate and Rhythm: Normal rate and regular rhythm.     Heart sounds: S1 normal and S2 normal.     Comments: Distant heart tones Pulmonary:     Effort: No tachypnea or accessory muscle usage.     Breath sounds: No stridor. Decreased breath sounds (throughout all lung fields) present. No wheezing, rhonchi or rales.     Comments: Diminished breath sounds in the bases.  Abdominal:     General: Bowel sounds are normal. There is no distension.     Palpations: Abdomen is soft.     Tenderness: There is no abdominal tenderness.  Musculoskeletal:        General: Deformity (muscle  wasting ) present.  Skin:    General: Skin is warm and dry.     Capillary Refill: Capillary refill takes less than 2 seconds.     Findings: No rash.  Neurological:     Mental Status: He is alert and oriented to person, place, and time.  Psychiatric:        Behavior: Behavior normal.     Vitals:    09/22/21 1347  BP: (!) 104/58  Pulse: 96  Temp: 97.8 F (36.6 C)  TempSrc: Oral  SpO2: (!) 87%  Weight: 156 lb (70.8 kg)  Height: 5\' 8"  (1.727 m)   (!) 87% on RA BMI Readings from Last 3 Encounters:  09/22/21 23.72 kg/m  07/04/21 24.75 kg/m  06/23/21 24.75 kg/m   Wt Readings from Last 3 Encounters:  09/22/21 156 lb (70.8 kg)  07/04/21 162 lb 12.8 oz (73.8 kg)  06/23/21 162 lb 12.8 oz (73.8 kg)    CBC    Component Value Date/Time   WBC 6.4 04/29/2021 1014   WBC 8.1 03/28/2020 0840   RBC 2.66 (L) 04/29/2021 1014   HGB 10.1 (L) 04/29/2021 1014   HCT 30.8 (L) 04/29/2021 1014   PLT 288 04/29/2021 1014   MCV 115.8 (H) 04/29/2021 1014   MCH 38.0 (H) 04/29/2021 1014   MCHC 32.8 04/29/2021 1014   RDW 15.3 04/29/2021 1014   LYMPHSABS 0.6 (L) 04/29/2021 1014   MONOABS 0.6 04/29/2021 1014   EOSABS 0.1 04/29/2021 1014   BASOSABS 0.1 04/29/2021 1014   Results for Dixon, Robert D "[name]" (MRN [id number]) as of 01/19/2021 08:01  Ref. Range 10/08/2019 07:38 03/16/2020 16:06 03/26/2020 13:00  Monocyte-Macrophage-Serous Fluid Latest Ref Range: 50 - 90 % 4 (L) 8 (L) 10 (L)  Other Cells, Fluid Latest Units: % OTHER CELLS IDENTIFIED AS MESOTHELIAL CELLS OTHER CELLS IDENTIFIED AS MESOTHELIAL CELLS   Albumin, Fluid Latest Units: g/dL  1.9   Fluid Type-FALB Unknown  CYTO PLEU   Glucose, Fluid Latest Units: mg/dL 111 151   Fluid Type-FGLU Unknown PLEURAL LEFT CYTO PLEU   Fluid Type-FLDH Unknown  CYTO PLEU   LD, Fluid Latest Ref Range: 3 - 23 U/L  89 (H)   Lipase-Fluid Latest Units: U/L  5   Total protein, fluid Latest Units: g/dL 3.5 <3.0   Triglycerides, Fluid Latest Ref Range: Not Estab. mg/dL  <9   pH, Body Fluid Latest Ref Range: Not Estab.  7.8    Fluid Type-FCT Unknown PLEURAL LEFT CYTO PLEU PLEURAL  Fluid Type-FTP Unknown PLEURAL LEFT CYTO PLEU   Fluid Type-FTRIG Unknown  CYTO PLEU   Color, Fluid Unknown YELLOW YELLOW YELLOW  Total Nucleated Cell Count, Fluid Latest Ref Range:  0 - 1,000 cu mm 1,555 (H) 309 233  Lymphs, Fluid Latest Units: % 96 89 68  Eos, Fluid Latest Units: % 0    Appearance, Fluid Latest Ref Range: CLEAR  HAZY (A) CLEAR (A) CLEAR (A)  Neutrophil Count, Fluid Latest Ref Range: 0 - 25 % 0 3 22    Chest Imaging: CT chest 11/20/2018: Left hilar mass with associated adenopathy endobronchial narrowing, subcarinal and bilateral hilar adenopathy. Ill-defined small density within the liver possible focus of metastatic disease.  CT chest July 2020: Fibrotic post radiation changes into the left chest, pleural thickening, intralobular septal thickening The patient's images have been independently reviewed by me.    CT chest October 2020: Fibrotic changes of the left lung pleural thickening interlobular septal thickening and banding consistent with radiation treatments.  Also has a loculated left-sided effusion.  Pleural effusion is larger in comparison to previous CT imaging. The patient's images have been independently reviewed by me.   CT chest 10/29/2020: Left-sided pleural effusion, small right-sided effusion.  Emphysema.  Pulmonary Functions Testing Results: PFT Results Latest Ref Rng & Units 06/21/2020  FVC-Pre L 2.75  FVC-Predicted Pre % 80  FVC-Post L 2.86  FVC-Predicted Post % 83  Pre FEV1/FVC % % 54  Post FEV1/FCV % % 50  FEV1-Pre L 1.49  FEV1-Predicted Pre % 61  FEV1-Post L 1.44  DLCO uncorrected ml/min/mmHg 4.58  DLCO UNC% % 21  DLCO corrected ml/min/mmHg 4.58  DLCO COR %Predicted % 21  DLVA Predicted % 29  TLC L 5.37  TLC % Predicted % 85  RV % Predicted % 110    FeNO: None   Pathology: None   Echocardiogram:  October 2020: Severe calcific aortic stenosis.  Please see echo report for details  Heart Catheterization: None     Assessment & Plan:     ICD-10-CM   1. Chronic respiratory failure with hypoxia (HCC)  J96.11 Flutter valve    2. Centrilobular emphysema (HCC)  J43.2 Flutter valve      Discussion:  This  is a 80 year old gentleman, history of stage III non-small cell lung cancer, significant centrilobular emphysema, chronic hypoxemic respiratory failure, radiation-induced pulmonary fibrosis and recurrent left-sided pleural effusion, history of severe aortic stenosis status post TAVR.  Multifactorial etiology as listed above for his chronic hypoxemic respiratory failure  Plan: Continue COPD medications, triple therapy Continue albuterol as needed Continue to encourage him to be active as possible. I will think there is any indication for repeat tap of his effusion, he likely has trapped lung and the effusion will continue to recur.     Current Outpatient Medications:    acetaminophen (TYLENOL) 325 MG tablet, Take 2 tablets (650 mg total) by mouth every 6 (six) hours as needed for mild pain (or Fever >/= 101)., Disp: , Rfl:    albuterol (PROVENTIL) (2.5 MG/3ML) 0.083% nebulizer solution, Take 3 mLs (2.5 mg total) by nebulization every 4 (four) hours., Disp: 1080 mL, Rfl: 5   albuterol (VENTOLIN HFA) 108 (90 Base) MCG/ACT inhaler, Inhale 2 puffs into the lungs every 6 (six) hours as needed for wheezing or shortness of breath., Disp: 24 g, Rfl: 3   alum & mag hydroxide-simeth (MAALOX/MYLANTA) 200-200-20 MG/5ML suspension, Take 15 mLs by mouth every 6 (six) hours as needed for indigestion or heartburn., Disp: 355 mL, Rfl: 0   ascorbic acid (VITAMIN C) 500 MG tablet, Take 500 mg by mouth daily., Disp: , Rfl:    aspirin 81 MG chewable tablet, Chew 1 tablet (81 mg total) by mouth daily., Disp: , Rfl:    atorvastatin (LIPITOR) 40 MG tablet, TAKE 1 TABLET (40 MG TOTAL) BY MOUTH DAILY., Disp: 90 tablet, Rfl: 3   citalopram (CELEXA) 10 MG tablet, Take 10 mg by mouth daily., Disp: , Rfl:    COVID-19 mRNA bivalent vaccine, Pfizer, (PFIZER COVID-19 VAC BIVALENT) injection, Inject into the muscle., Disp: 0.3 mL, Rfl: 0   Cyanocobalamin (VITAMIN B 12 PO), Take 1 capsule by mouth daily., Disp: , Rfl:     Fluticasone-Umeclidin-Vilant (TRELEGY ELLIPTA) 100-62.5-25 MCG/ACT AEPB, Inhale 1 puff into the lungs daily., Disp: 60 each, Rfl: 0   Fluticasone-Umeclidin-Vilant (TRELEGY ELLIPTA) 100-62.5-25 MCG/INH AEPB, Inhale 1 puff into the lungs daily., Disp: 180 each, Rfl: 3   furosemide (LASIX) 20 MG tablet, TAKE 1 TABLET (20  MG TOTAL) BY MOUTH DAILY., Disp: 90 tablet, Rfl: 3   metoprolol succinate (TOPROL-XL) 25 MG 24 hr tablet, TAKE 1 TABLET EVERY DAY, Disp: 90 tablet, Rfl: 3   Multiple Vitamin (MULTIVITAMIN) tablet, Take 1 tablet by mouth daily., Disp: , Rfl:    pantoprazole (PROTONIX) 40 MG tablet, Take 40 mg by mouth 2 (two) times daily., Disp: , Rfl:    polycarbophil (FIBERCON) 625 MG tablet, Take 625 mg by mouth 2 (two) times daily. , Disp: , Rfl:    silodosin (RAPAFLO) 8 MG CAPS capsule, Take 8 mg by mouth daily., Disp: , Rfl:    tamsulosin (FLOMAX) 0.4 MG CAPS capsule, Take 0.4 mg by mouth daily.  (Patient not taking: No sig reported), Disp: , Rfl:  No current facility-administered medications for this visit.  Facility-Administered Medications Ordered in Other Visits:    sodium chloride flush (NS) 0.9 % injection 10 mL, 10 mL, Intracatheter, PRN, [name], MD   sodium chloride flush (NS) 0.9 % injection 10 mL, 10 mL, Intracatheter, PRN, [name], MD, 10 mL at 05/20/19 [place] Cecil Vandyke, DO [facility] 09/24/2021 9:37 AM

## 2021-09-22 NOTE — Patient Instructions (Addendum)
Thank you for visiting [name] at [facility]. Today we recommend the following:  Trelegy samples Continue albuterol as needed  CT in decemeber prior to Dr. Jerilynn Mages Appt   Return in about 6 months (around 03/23/2022) for w/ [name] .    Please do your part to reduce the spread of COVID-19.

## 2021-10-13 ENCOUNTER — Other Ambulatory Visit: Payer: Self-pay

## 2021-10-13 ENCOUNTER — Inpatient Hospital Stay: Payer: Medicare HMO | Attending: Internal Medicine

## 2021-10-13 DIAGNOSIS — Z9221 Personal history of antineoplastic chemotherapy: Secondary | ICD-10-CM | POA: Insufficient documentation

## 2021-10-13 DIAGNOSIS — Z923 Personal history of irradiation: Secondary | ICD-10-CM | POA: Insufficient documentation

## 2021-10-13 DIAGNOSIS — Z452 Encounter for adjustment and management of vascular access device: Secondary | ICD-10-CM | POA: Insufficient documentation

## 2021-10-13 DIAGNOSIS — Z85118 Personal history of other malignant neoplasm of bronchus and lung: Secondary | ICD-10-CM | POA: Diagnosis not present

## 2021-11-03 ENCOUNTER — Other Ambulatory Visit: Payer: Self-pay | Admitting: Physician Assistant

## 2021-11-03 DIAGNOSIS — C3492 Malignant neoplasm of unspecified part of left bronchus or lung: Secondary | ICD-10-CM

## 2021-11-04 ENCOUNTER — Inpatient Hospital Stay: Payer: Medicare HMO | Attending: Internal Medicine

## 2021-11-04 ENCOUNTER — Other Ambulatory Visit: Payer: Self-pay

## 2021-11-04 ENCOUNTER — Ambulatory Visit (HOSPITAL_COMMUNITY)
Admission: RE | Admit: 2021-11-04 | Discharge: 2021-11-04 | Disposition: A | Discharge status: Home / Self Care | Payer: Medicare HMO | Source: Ambulatory Visit | Attending: Internal Medicine | Admitting: Internal Medicine

## 2021-11-04 DIAGNOSIS — I110 Hypertensive heart disease with heart failure: Secondary | ICD-10-CM | POA: Insufficient documentation

## 2021-11-04 DIAGNOSIS — C3490 Malignant neoplasm of unspecified part of unspecified bronchus or lung: Secondary | ICD-10-CM | POA: Insufficient documentation

## 2021-11-04 DIAGNOSIS — Z85118 Personal history of other malignant neoplasm of bronchus and lung: Secondary | ICD-10-CM | POA: Insufficient documentation

## 2021-11-04 DIAGNOSIS — C3492 Malignant neoplasm of unspecified part of left bronchus or lung: Secondary | ICD-10-CM

## 2021-11-04 DIAGNOSIS — I700 Atherosclerosis of aorta: Secondary | ICD-10-CM | POA: Diagnosis not present

## 2021-11-04 DIAGNOSIS — J439 Emphysema, unspecified: Secondary | ICD-10-CM | POA: Diagnosis not present

## 2021-11-04 DIAGNOSIS — I5022 Chronic systolic (congestive) heart failure: Secondary | ICD-10-CM | POA: Insufficient documentation

## 2021-11-04 DIAGNOSIS — I3139 Other pericardial effusion (noninflammatory): Secondary | ICD-10-CM | POA: Diagnosis not present

## 2021-11-04 DIAGNOSIS — J90 Pleural effusion, not elsewhere classified: Secondary | ICD-10-CM | POA: Diagnosis not present

## 2021-11-04 LAB — CMP (CANCER CENTER ONLY)
ALT: 20 U/L (ref 0–44)
AST: 19 U/L (ref 15–41)
Albumin: 3.7 g/dL (ref 3.5–5.0)
Alkaline Phosphatase: 132 U/L — ABNORMAL HIGH (ref 38–126)
Anion gap: 7 (ref 5–15)
BUN: 19 mg/dL (ref 8–23)
CO2: 26 mmol/L (ref 22–32)
Calcium: 8.6 mg/dL — ABNORMAL LOW (ref 8.9–10.3)
Chloride: 108 mmol/L (ref 98–111)
Creatinine: 1.14 mg/dL (ref 0.61–1.24)
GFR, Estimated: >60 mL/min (ref >60)
Glucose, Bld: 112 mg/dL — ABNORMAL HIGH (ref 70–99)
Potassium: 4.3 mmol/L (ref 3.5–5.1)
Sodium: 141 mmol/L (ref 135–145)
Total Bilirubin: 0.8 mg/dL (ref 0.3–1.2)
Total Protein: 6.5 g/dL (ref 6.5–8.1)

## 2021-11-04 LAB — CBC WITH DIFFERENTIAL (CANCER CENTER ONLY)
Abs Immature Granulocytes: 0.02 10*3/uL (ref 0.00–0.07)
Basophils Absolute: 0.1 10*3/uL (ref 0.0–0.1)
Basophils Relative: 2 %
Eosinophils Absolute: 0.1 10*3/uL (ref 0.0–0.5)
Eosinophils Relative: 1 %
HCT: 31.7 % — ABNORMAL LOW (ref 39.0–52.0)
Hemoglobin: 10.4 g/dL — ABNORMAL LOW (ref 13.0–17.0)
Immature Granulocytes: 0 %
Lymphocytes Relative: 10 %
Lymphs Abs: 0.6 10*3/uL — ABNORMAL LOW (ref 0.7–4.0)
MCH: 39.4 pg — ABNORMAL HIGH (ref 26.0–34.0)
MCHC: 32.8 g/dL (ref 30.0–36.0)
MCV: 120.1 fL — ABNORMAL HIGH (ref 80.0–100.0)
Monocytes Absolute: 0.5 10*3/uL (ref 0.1–1.0)
Monocytes Relative: 9 %
Neutro Abs: 4.4 10*3/uL (ref 1.7–7.7)
Neutrophils Relative %: 78 %
Platelet Count: 216 10*3/uL (ref 150–400)
RBC: 2.64 MIL/uL — ABNORMAL LOW (ref 4.22–5.81)
RDW: 15.1 % (ref 11.5–15.5)
WBC Count: 5.6 10*3/uL (ref 4.0–10.5)
nRBC: 0 % (ref 0.0–0.2)

## 2021-11-07 ENCOUNTER — Inpatient Hospital Stay: Payer: Medicare HMO | Admitting: Internal Medicine

## 2021-11-07 ENCOUNTER — Other Ambulatory Visit: Payer: Self-pay

## 2021-11-07 VITALS — BP 110/51 | HR 94 | Temp 98.3°F | Resp 20 | Ht 68.0 in | Wt 159.7 lb

## 2021-11-07 DIAGNOSIS — C3490 Malignant neoplasm of unspecified part of unspecified bronchus or lung: Secondary | ICD-10-CM | POA: Diagnosis not present

## 2021-11-07 DIAGNOSIS — Z85118 Personal history of other malignant neoplasm of bronchus and lung: Secondary | ICD-10-CM | POA: Diagnosis not present

## 2021-11-07 DIAGNOSIS — I5022 Chronic systolic (congestive) heart failure: Secondary | ICD-10-CM | POA: Diagnosis not present

## 2021-11-07 DIAGNOSIS — I110 Hypertensive heart disease with heart failure: Secondary | ICD-10-CM | POA: Diagnosis not present

## 2021-11-07 DIAGNOSIS — C3412 Malignant neoplasm of upper lobe, left bronchus or lung: Secondary | ICD-10-CM

## 2021-11-07 NOTE — Progress Notes (Signed)
[place] [phone]   [phone]  OFFICE PROGRESS NOTE  [name], MD [address] [place] [address]  DIAGNOSIS: Stage IIIB (T1b, N3, M0) non-small cell lung cancer favoring adenocarcinoma diagnosed in January 2020 and presented with left lower lobe pulmonary nodule in addition to bilateral hilar and subcarinal lymphadenopathy.   Molecular studies by guardant 360 showed no actionable mutations.   PRIOR THERAPY:  1) Concurrent chemoradiation with weekly carboplatin for AUC of 2 and paclitaxel 45 mg/M2. First dose February 4th, 2020. Status post 5 cycles.  2)  Consolidation immunotherapy with Imfinzi 10 mg/KG every 2 weeks, status post 26 cycles.   CURRENT THERAPY:  Observation.  INTERVAL HISTORY: Robert Dixon 80 y.o. male returns to the clinic today for follow-up visit.  The patient is feeling fine today with no concerning complaints except for the baseline shortness of breath and mild cough.  He denied having any chest pain or hemoptysis.  He denied having any fever or chills.  He has no nausea, vomiting, diarrhea or constipation.  He has no headache or visual changes.  He has no recent weight loss or night sweats.  The patient had repeat CT scan of the chest performed recently and is here for evaluation and discussion of his scan results.  MEDICAL HISTORY: Past Medical History:  Diagnosis Date   Acute on chronic respiratory failure with hypoxia ([place]) 03/16/2020   Acute respiratory failure ([place]) 04/09/2013   Carotid artery disease ([place]) 03/28/2020   Carotid US 02/2020:  R 100%; L 5-46%   Chronic systolic CHF 27/01/5008   Echo 02/2020: EF 30-35, small pericardial effusion, trivial MR, status post TAVR with no PVL, mean gradient 7 mmHg   Complication of anesthesia    COPD (chronic obstructive pulmonary disease) (HCC)    GERD (gastroesophageal reflux disease)    Hemoptysis 11/20/2018   History of hiatal hernia     Hypertension    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  MEDICATIONS:  Current Outpatient Medications  Medication Sig Dispense Refill   acetaminophen (TYLENOL) 325 MG tablet Take 2 tablets (650 mg total) by mouth every 6 (six) hours as needed for mild pain (or Fever >/= 101).     albuterol (PROVENTIL) (2.5 MG/3ML) 0.083% nebulizer solution Take 3 mLs (2.5 mg total) by nebulization every 4 (four) hours. 1080 mL 5   albuterol (VENTOLIN HFA) 108 (90 Base) MCG/ACT inhaler Inhale 2 puffs into the lungs every 6 (six) hours as needed for wheezing or shortness of breath. 24 g 3   alum & mag hydroxide-simeth (MAALOX/MYLANTA) 200-200-20 MG/5ML suspension Take 15 mLs by mouth every 6 (six) hours as needed for indigestion or heartburn. 355 mL 0   ascorbic acid (VITAMIN C) 500 MG tablet Take 500 mg by mouth daily.     aspirin 81 MG chewable tablet Chew 1 tablet (81 mg total) by mouth daily.  atorvastatin (LIPITOR) 40 MG tablet TAKE 1 TABLET (40 MG TOTAL) BY MOUTH DAILY. 90 tablet 3   citalopram (CELEXA) 10 MG tablet Take 10 mg by mouth daily.     COVID-19 mRNA bivalent vaccine, Pfizer, (PFIZER COVID-19 VAC BIVALENT) injection Inject into the muscle. 0.3 mL 0   Cyanocobalamin (VITAMIN B 12 PO) Take 1 capsule by mouth daily.     Fluticasone-Umeclidin-Vilant (TRELEGY ELLIPTA) 100-62.5-25 MCG/ACT AEPB Inhale 1 puff into the lungs daily. 60 each 0   Fluticasone-Umeclidin-Vilant  (TRELEGY ELLIPTA) 100-62.5-25 MCG/INH AEPB Inhale 1 puff into the lungs daily. 180 each 3   furosemide (LASIX) 20 MG tablet TAKE 1 TABLET (20 MG TOTAL) BY MOUTH DAILY. 90 tablet 3   metoprolol succinate (TOPROL-XL) 25 MG 24 hr tablet TAKE 1 TABLET EVERY DAY 90 tablet 3   Multiple Vitamin (MULTIVITAMIN) tablet Take 1 tablet by mouth daily.     pantoprazole (PROTONIX) 40 MG tablet Take 40 mg by mouth 2 (two) times daily.     polycarbophil (FIBERCON) 625 MG tablet Take 625 mg by mouth 2 (two) times daily.      silodosin (RAPAFLO) 8 MG CAPS capsule Take 8 mg by mouth daily.     tamsulosin (FLOMAX) 0.4 MG CAPS capsule Take 0.4 mg by mouth daily.  (Patient not taking: No sig reported)     No current facility-administered medications for this visit.   Facility-Administered Medications Ordered in Other Visits  Medication Dose Route Frequency Provider Last Rate Last Admin   sodium chloride flush (NS) 0.9 % injection 10 mL  10 mL Intracatheter PRN [name], MD       sodium chloride flush (NS) 0.9 % injection 10 mL  10 mL Intracatheter PRN [name], MD   10 mL at 05/20/19 1232    SURGICAL HISTORY:  Past Surgical History:  Procedure Laterality Date   COLONOSCOPY W/ POLYPECTOMY     IR IMAGING GUIDED PORT INSERTION  03/14/2019   IR THORACENTESIS ASP PLEURAL SPACE W/IMG GUIDE  03/26/2020   RIGHT HEART CATH AND CORONARY ANGIOGRAPHY N/A 03/19/2020   Procedure: RIGHT HEART CATH AND CORONARY ANGIOGRAPHY;  Surgeon: [name], MD;  Location: [facility];  Service: Cardiovascular;  Laterality: N/A;   TEE WITHOUT CARDIOVERSION N/A 03/25/2020   Procedure: TRANSESOPHAGEAL ECHOCARDIOGRAM (TEE);  Surgeon: [name], MD;  Location: [facility];  Service: Open Heart Surgery;  Laterality: N/A;   TONSILLECTOMY     TRANSCATHETER AORTIC VALVE REPLACEMENT, TRANSFEMORAL Left 03/25/2020   Procedure: TRANSCATHETER AORTIC VALVE REPLACEMENT, LEFT TRANSFEMORAL;  Surgeon: [name],  MD;  Location: [facility];  Service: Open Heart Surgery;  Laterality: Left;   VASECTOMY     VIDEO BRONCHOSCOPY WITH ENDOBRONCHIAL ULTRASOUND N/A 12/06/2018   Procedure: VIDEO BRONCHOSCOPY WITH ENDOBRONCHIAL ULTRASOUND;  Surgeon: Garner Nash, DO;  Location: [facility];  Service: Thoracic;  Laterality: N/A;    REVIEW OF SYSTEMS:  A comprehensive review of systems was negative except for: Respiratory: positive for cough and dyspnea on exertion   PHYSICAL EXAMINATION: General appearance: alert, cooperative, fatigued, and no distress Head: Normocephalic, without obvious abnormality, atraumatic Neck: no adenopathy, no JVD, supple, symmetrical, trachea midline, and thyroid not enlarged, symmetric, no tenderness/mass/nodules Lymph nodes: Cervical, supraclavicular, and axillary nodes normal. Resp: clear to auscultation bilaterally Back: symmetric, no curvature. ROM normal. No CVA tenderness. Cardio: systolic murmur: early systolic 3/6, harsh at 2nd right intercostal space GI: soft, non-tender; bowel sounds normal; no masses,  no organomegaly Extremities: extremities  normal, atraumatic, no cyanosis or edema  ECOG PERFORMANCE STATUS: 1 - Symptomatic but completely ambulatory  Blood pressure (!) 110/51, pulse 94, temperature 98.3 F (36.8 C), temperature source Tympanic, resp. rate 20, height 5\' 8"  (1.727 m), weight 159 lb 11.2 oz (72.4 kg), SpO2 92 %.  LABORATORY DATA: Lab Results  Component Value Date   WBC 5.6 11/04/2021   HGB 10.4 (L) 11/04/2021   HCT 31.7 (L) 11/04/2021   MCV 120.1 (H) 11/04/2021   PLT 216 11/04/2021      Chemistry      Component Value Date/Time   NA 141 11/04/2021 1133   K 4.3 11/04/2021 1133   CL 108 11/04/2021 1133   CO2 26 11/04/2021 1133   BUN 19 11/04/2021 1133   CREATININE 1.14 11/04/2021 1133   CREATININE 1.20 (H) 08/25/2020 1140      Component Value Date/Time   CALCIUM 8.6 (L) 11/04/2021 1133   ALKPHOS 132 (H) 11/04/2021 1133   AST 19  11/04/2021 1133   ALT 20 11/04/2021 1133   BILITOT 0.8 11/04/2021 1133       RADIOGRAPHIC STUDIES: CT Chest Wo Contrast  Result Date: 11/06/2021 CLINICAL DATA:  Non-small cell lung cancer EXAM: CT CHEST WITHOUT CONTRAST TECHNIQUE: Multidetector CT imaging of the chest was performed following the standard protocol without IV contrast. COMPARISON:  Multiple priors, most recent April 29, 2021 FINDINGS: Cardiovascular: Normal heart size. Unchanged moderate pericardial effusion. Status post TAVR. Three-vessel coronary artery calcifications. Right-sided port with tip in the distal SVC. Mediastinum/Nodes: No pathologically enlarged lymph nodes seen in the chest. Small hiatal hernia. Thyroid is unremarkable. Lungs/Pleura: Dependent debris noted in the trachea. Central airways are patent. Severe centrilobular emphysema. New irregular nodular consolidations of the bilateral lower lobes and posterior left upper lobe, for example left lower lobe image 101 and right lower lobe image 112. Similar appearance of left paramediastinal post radiation change. Trace right and small left pleural effusion, unchanged in size compared to prior exam. Upper Abdomen: Unchanged low-attenuation liver lesions which are likely simple cysts lytic lesion of the left kidney which is likely a simple cyst. No acute abnormality. Musculoskeletal: Unchanged compression deformity of T7. No aggressive appearing osseous lesions. IMPRESSION: 1. Left paramediastinal post radiation change with new irregular nodular consolidations of the bilateral lower lobes and posterior left upper lobe. Findings are possibly due to aspiration. Recommend short-term follow-up in 1-2 months to ensure resolution. 2. Trace right and small left pleural effusion, unchanged in size compared to prior exam. 3. Aortic Atherosclerosis (ICD10-I70.0). Electronically Signed   By: Yetta Glassman M.D.   On: 11/06/2021 11:12     ASSESSMENT AND PLAN: This is a very pleasant 80  years old white male diagnosed with stage IIIb non-small cell lung cancer, adenocarcinoma with no actionable mutations  Completed the course of concurrent chemoradiation with weekly carboplatin and paclitaxel status post 5 cycles.  He has partial response to this treatment. The patient also completed consolidation treatment with immunotherapy with Imfinzi for 26 cycles. The patient is currently on observation and he is feeling fine today with no concerning complaints except for the baseline shortness of breath and mild cough secondary to COPD. Had repeat CT scan of the chest performed recently.  I personally and independently reviewed the scan images and discussed the result and showed the images to the patient and his daughter-in-law.  His scan showed no concerning finding except for the postradiation changes with new irregular nodular consolidation of the bilateral lower lobe and posterior  left upper lobe suspicious for aspiration. I recommended for the patient to continue on observation but I will repeat CT scan of the chest in 3 months to confirm resolution of of these abnormalities. The patient will follow with [name] for his COPD and also aspiration. He was advised to call immediately if he has any other concerning symptoms in the interval.  The patient voices understanding of current disease status and treatment options and is in agreement with the current care plan.  All questions were answered. The patient knows to call the clinic with any problems, questions or concerns. We can certainly see the patient much sooner if necessary.  Disclaimer: This note was dictated with voice recognition software. Similar sounding words can inadvertently be transcribed and may not be corrected upon review.

## 2021-11-11 ENCOUNTER — Telehealth: Payer: Self-pay | Admitting: Pulmonary Disease

## 2021-11-11 NOTE — Telephone Encounter (Signed)
Called and spoke with [facility]. She stated that the patient had a CT on 11/04/21 and she is concerned about the results. She would like for BI to review them. Per the CT, it shows that he may have aspirated something last week. Over the last few days he has not been feeling like himself. His cough has increased and he is more fatigued. She's wondering if he may be developing aspiration PNA.   She would like to know if [name] would be willing to send in Levaquin so that they can beat it before it turns into PNA.   [facility] [address].   BI, can you please advise? Thanks!

## 2021-11-14 MED ORDER — AMOXICILLIN-POT CLAVULANATE 875-125 MG PO TABS: 1.0000 | ORAL_TABLET | Freq: Two times a day (BID) | ORAL | 0 refills | Status: DC

## 2021-11-14 NOTE — Telephone Encounter (Signed)
Please advise on the antibiotic for the patient.

## 2021-11-14 NOTE — Telephone Encounter (Signed)
Spoke with Deeanna (per DPR) and reviewed Dr. August Albino recommendations along with medication instructions. Deeanna verified pharmacy and stated understanding. Nothing further needed at this time.

## 2021-11-15 ENCOUNTER — Telehealth: Payer: Self-pay | Admitting: Pulmonary Disease

## 2021-11-16 NOTE — Telephone Encounter (Signed)
Robert Nash, DO to [place], Robert Dixon, CMA  [facility] Triage Pool      2:26 PM The patient must have an FEV1 of less than 45% for consideration of endobronchial valve placement.   He does not qualify for referral for endobronchial valve placement.   I am happy to discuss with them in the office if they would like.   Robert Nash, DO  [facility]  11/16/2021 2:25 PM   Spoke with the pt's niece and notified of response per BI. She verbalized understanding. Will discuss with the pt and call back for appt if this is what pt wants to do. Nothing further needed

## 2021-11-16 NOTE — Telephone Encounter (Signed)
Attempted to call [facility] but unable to reach. Left message for her to return call.

## 2021-11-16 NOTE — Telephone Encounter (Signed)
Called and spoke with pt's daughter-n-law  [facility] who wants to know if pt could be referred for Zephyr Valve.  [name], please advise if you are okay with this referral being placed. If so, please advise if you know of anywhere in [place] or anywhere at all that does these referrals so we can get this info put in the referral order.

## 2021-11-17 ENCOUNTER — Telehealth: Payer: Self-pay | Admitting: Internal Medicine

## 2021-11-17 NOTE — Telephone Encounter (Signed)
Sch per 12/12 los, pt aware

## 2021-11-30 DIAGNOSIS — L819 Disorder of pigmentation, unspecified: Secondary | ICD-10-CM | POA: Diagnosis not present

## 2021-11-30 DIAGNOSIS — I70203 Unspecified atherosclerosis of native arteries of extremities, bilateral legs: Secondary | ICD-10-CM | POA: Diagnosis not present

## 2021-12-02 ENCOUNTER — Encounter: Payer: Self-pay | Admitting: Internal Medicine

## 2021-12-02 DIAGNOSIS — K4020 Bilateral inguinal hernia, without obstruction or gangrene, not specified as recurrent: Secondary | ICD-10-CM | POA: Diagnosis not present

## 2021-12-02 DIAGNOSIS — I10 Essential (primary) hypertension: Secondary | ICD-10-CM | POA: Diagnosis not present

## 2021-12-02 DIAGNOSIS — K5730 Diverticulosis of large intestine without perforation or abscess without bleeding: Secondary | ICD-10-CM | POA: Diagnosis not present

## 2021-12-02 DIAGNOSIS — R238 Other skin changes: Secondary | ICD-10-CM | POA: Diagnosis not present

## 2021-12-02 DIAGNOSIS — N281 Cyst of kidney, acquired: Secondary | ICD-10-CM | POA: Diagnosis not present

## 2021-12-02 DIAGNOSIS — N3289 Other specified disorders of bladder: Secondary | ICD-10-CM | POA: Diagnosis not present

## 2021-12-02 DIAGNOSIS — R791 Abnormal coagulation profile: Secondary | ICD-10-CM | POA: Diagnosis not present

## 2021-12-02 DIAGNOSIS — L988 Other specified disorders of the skin and subcutaneous tissue: Secondary | ICD-10-CM | POA: Diagnosis not present

## 2021-12-02 DIAGNOSIS — J449 Chronic obstructive pulmonary disease, unspecified: Secondary | ICD-10-CM | POA: Diagnosis not present

## 2021-12-02 DIAGNOSIS — R7989 Other specified abnormal findings of blood chemistry: Secondary | ICD-10-CM | POA: Diagnosis not present

## 2021-12-02 DIAGNOSIS — Z79899 Other long term (current) drug therapy: Secondary | ICD-10-CM | POA: Diagnosis not present

## 2021-12-06 ENCOUNTER — Ambulatory Visit: Payer: Medicare HMO | Admitting: Vascular Surgery

## 2021-12-06 ENCOUNTER — Encounter: Payer: Self-pay | Admitting: Vascular Surgery

## 2021-12-06 ENCOUNTER — Other Ambulatory Visit: Payer: Self-pay

## 2021-12-06 ENCOUNTER — Ambulatory Visit (HOSPITAL_COMMUNITY)
Admission: RE | Admit: 2021-12-06 | Discharge: 2021-12-06 | Disposition: A | Discharge status: Home / Self Care | Payer: Medicare HMO | Source: Ambulatory Visit | Attending: Vascular Surgery | Admitting: Vascular Surgery

## 2021-12-06 VITALS — BP 100/65 | HR 100 | Temp 99.0°F | Resp 20 | Ht 68.0 in | Wt 159.0 lb

## 2021-12-06 DIAGNOSIS — I75022 Atheroembolism of left lower extremity: Secondary | ICD-10-CM | POA: Diagnosis not present

## 2021-12-06 DIAGNOSIS — I739 Peripheral vascular disease, unspecified: Secondary | ICD-10-CM | POA: Diagnosis not present

## 2021-12-06 NOTE — Progress Notes (Signed)
[facility] [place]  ASSESSMENT / PLAN: 81 y.o. male with asymptomatic left first < second toe cyanosis. No evidence of hemodynamically significant peripheral arterial disease on physical exam, non-invasive testing, CT angiogram.  He has a history of TAVR, has not had an echocardiogram since May 2022.  Recent CT scan of the chest without contrast shows calcification, but no "coral reef" lesion that would benefit from stenting.  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  We will update his echocardiogram.  I do not think he needs an angiogram given his palpable pedal pulse and reassuring ankle-brachial index.  We will monitor his left second toe as an outpatient.  Follow-up with me in 1 month.  CHIEF COMPLAINT: blue left second toe  HISTORY OF PRESENT ILLNESS: Robert Dixon is a 81 y.o. male with multiple medical problems, including history of TAVR, and COPD on 4 L of oxygen continuously.  His son-in-law noticed the development of a left second blue toe.  This prompted a battery of tests and an ER visit.  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  This prompted recommendation to present to the ER.  A CT angiogram was performed of the abdomen and pelvis and lower extremities.  This revealed no evidence of hemodynamically significant stenosis.  As is typical the infrageniculate circulation is not well visualized.  Today in my office, the patient has no complaints.  He has no pain about his feet.  He is minimally ambulatory around the house.  He does not report symptoms typical of rest pain.  The toe lesion is not painful to him.  He has no ulcers about his feet.  Past Medical History:  Diagnosis Date   Acute on chronic respiratory failure with hypoxia ([place]) 03/16/2020   Acute respiratory failure ([place]) 04/09/2013   Carotid artery disease ([place]) 03/28/2020   Carotid US 02/2020:  R 100%; L 0-92%   Chronic systolic CHF 33/00/7622   Echo 02/2020: EF  30-35, small pericardial effusion, trivial MR, status post TAVR with no PVL, mean gradient 7 mmHg   Complication of anesthesia    COPD (chronic obstructive pulmonary disease) (HCC)    GERD (gastroesophageal reflux disease)    Hemoptysis 11/20/2018   History of hiatal hernia    Hypertension    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   Mediastinal adenopathy 12/06/2018   Migraine with visual aura    Non-small cell carcinoma of left lung, 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   Non-small cell lung cancer ([place]) dx'd 11/20/18   Pericardial effusion 10/14/2019   Pneumonia 11/20/2018   PONV (postoperative nausea and vomiting)    Primary malignant neoplasm of left upper lobe of lung ([place]) 12/12/2018   Pulmonary nodule 11/20/2018   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   Severe aortic stenosis 10/14/2019   Skin cancer     Past Surgical History:  Procedure Laterality Date   COLONOSCOPY W/ POLYPECTOMY     IR IMAGING GUIDED PORT INSERTION  03/14/2019   IR THORACENTESIS ASP PLEURAL SPACE W/IMG GUIDE  03/26/2020   RIGHT HEART CATH AND CORONARY ANGIOGRAPHY N/A 03/19/2020   Procedure: RIGHT HEART CATH AND CORONARY ANGIOGRAPHY;  Surgeon: [name], MD;  Location: [facility];  Service: Cardiovascular;  Laterality: N/A;   TEE WITHOUT CARDIOVERSION N/A 03/25/2020   Procedure: TRANSESOPHAGEAL ECHOCARDIOGRAM (TEE);  Surgeon: [name], MD;  Location: [facility];  Service: Open Heart Surgery;  Laterality: N/A;   TONSILLECTOMY  TRANSCATHETER AORTIC VALVE REPLACEMENT, TRANSFEMORAL Left 03/25/2020   Procedure: TRANSCATHETER AORTIC VALVE REPLACEMENT, LEFT TRANSFEMORAL;  Surgeon: [name], MD;  Location: [facility];  Service: Open Heart Surgery;  Laterality: Left;   VASECTOMY     VIDEO BRONCHOSCOPY WITH ENDOBRONCHIAL ULTRASOUND N/A 12/06/2018   Procedure: VIDEO BRONCHOSCOPY WITH ENDOBRONCHIAL ULTRASOUND;  Surgeon: Garner Nash, DO;  Location: [facility];  Service:  Thoracic;  Laterality: N/A;    Family History  Problem Relation Age of Onset   Cancer - Lung Mother    Hypertension Father    CVA Father    Cancer - Cervical Sister     Social History   Socioeconomic History   Marital status: Widowed    Spouse name: Not on file   Number of children: Not on file   Years of education: Not on file   Highest education level: Not on file  Occupational History   Not on file  Tobacco Use   Smoking status: Former    Years: 30.00    Types: Cigarettes    Start date: 56    Quit date: 2000    Years since quitting: 23.0   Smokeless tobacco: Never  Vaping Use   Vaping Use: Never used  Substance and Sexual Activity   Alcohol use: No   Drug use: No   Sexual activity: Not on file  Other Topics Concern   Not on file  Social History Narrative   Lives alone.  Two sons.  Widower.     Social Determinants of Health   Financial Resource Strain: Not on file  Food Insecurity: Not on file  Transportation Needs: Not on file  Physical Activity: Not on file  Stress: Not on file  Social Connections: Not on file  Intimate Partner Violence: Not on file    Allergies  Allergen Reactions   Hydrocodone Bit-Homatrop Mbr     Other reaction(s): dizzy but can take with food    Current Outpatient Medications  Medication Sig Dispense Refill   acetaminophen (TYLENOL) 325 MG tablet Take 2 tablets (650 mg total) by mouth every 6 (six) hours as needed for mild pain (or Fever >/= 101).     albuterol (PROVENTIL) (2.5 MG/3ML) 0.083% nebulizer solution Take 3 mLs (2.5 mg total) by nebulization every 4 (four) hours. 1080 mL 5   albuterol (VENTOLIN HFA) 108 (90 Base) MCG/ACT inhaler Inhale 2 puffs into the lungs every 6 (six) hours as needed for wheezing or shortness of breath. 24 g 3   alum & mag hydroxide-simeth (MAALOX/MYLANTA) 200-200-20 MG/5ML suspension Take 15 mLs by mouth every 6 (six) hours as needed for indigestion or heartburn. 355 mL 0   ascorbic acid  (VITAMIN C) 500 MG tablet Take 500 mg by mouth daily.     aspirin 81 MG chewable tablet Chew 1 tablet (81 mg total) by mouth daily.     atorvastatin (LIPITOR) 40 MG tablet TAKE 1 TABLET (40 MG TOTAL) BY MOUTH DAILY. 90 tablet 3   citalopram (CELEXA) 10 MG tablet Take 10 mg by mouth daily.     COVID-19 mRNA bivalent vaccine, Pfizer, (PFIZER COVID-19 VAC BIVALENT) injection Inject into the muscle. 0.3 mL 0   Cyanocobalamin (VITAMIN B 12 PO) Take 1 capsule by mouth daily.     Fluticasone-Umeclidin-Vilant (TRELEGY ELLIPTA) 100-62.5-25 MCG/ACT AEPB Inhale 1 puff into the lungs daily. 60 each 0   Fluticasone-Umeclidin-Vilant (TRELEGY ELLIPTA) 100-62.5-25 MCG/INH AEPB Inhale 1 puff into the lungs daily. 180 each 3  furosemide (LASIX) 20 MG tablet TAKE 1 TABLET (20 MG TOTAL) BY MOUTH DAILY. 90 tablet 3   metoprolol succinate (TOPROL-XL) 25 MG 24 hr tablet TAKE 1 TABLET EVERY DAY 90 tablet 3   Multiple Vitamin (MULTIVITAMIN) tablet Take 1 tablet by mouth daily.     pantoprazole (PROTONIX) 40 MG tablet Take 40 mg by mouth 2 (two) times daily.     polycarbophil (FIBERCON) 625 MG tablet Take 625 mg by mouth 2 (two) times daily.      silodosin (RAPAFLO) 8 MG CAPS capsule Take 8 mg by mouth daily.     tamsulosin (FLOMAX) 0.4 MG CAPS capsule Take 0.4 mg by mouth daily.  (Patient not taking: Reported on 07/04/2021)     No current facility-administered medications for this visit.   Facility-Administered Medications Ordered in Other Visits  Medication Dose Route Frequency Provider Last Rate Last Admin   sodium chloride flush (NS) 0.9 % injection 10 mL  10 mL Intracatheter PRN [name], MD       sodium chloride flush (NS) 0.9 % injection 10 mL  10 mL Intracatheter PRN [name], MD   10 mL at 05/20/19 1232    REVIEW OF SYSTEMS:  [X]  denotes positive finding, [ ]  denotes negative finding Cardiac  Comments:  Chest pain or chest pressure:    Shortness of breath upon exertion:    Short of breath  when lying flat:    Irregular heart rhythm:        Vascular    Pain in calf, thigh, or hip brought on by ambulation:    Pain in feet at night that wakes you up from your sleep:     Blood clot in your veins:    Leg swelling:         Pulmonary    Oxygen at home:    Productive cough:     Wheezing:         Neurologic    Sudden weakness in arms or legs:     Sudden numbness in arms or legs:     Sudden onset of difficulty speaking or slurred speech:    Temporary loss of vision in one eye:     Problems with dizziness:         Gastrointestinal    Blood in stool:     Vomited blood:         Genitourinary    Burning when urinating:     Blood in urine:        Psychiatric    Major depression:         Hematologic    Bleeding problems:    Problems with blood clotting too easily:        Skin    Rashes or ulcers:        Constitutional    Fever or chills:      PHYSICAL EXAM Vitals:   12/06/21 1440  BP: 100/65  Pulse: 100  Resp: 20  Temp: 99 F (37.2 C)  SpO2: 90%  Weight: 159 lb (72.1 kg)  Height: 5\' 8"  (1.727 m)    Constitutional: Chronically ill appearing. Elderly. No distress. Appears well nourished.  Neurologic: CN intact. no focal findings. no sensory loss. Psychiatric:  Mood and affect symmetric and appropriate. Eyes:  No icterus. No conjunctival pallor. Ears, nose, throat:  mucous membranes moist. Midline trachea.  Cardiac: regular rate and rhythm.  Respiratory:  unlabored. Abdominal:  soft, non-tender, non-distended.  Peripheral vascular: 2+ DP pulses bilaterally.  Brisk (<5s) capillary refill in all toes. Extremity: Mild left foot edema. Left first and second toe cyanosis. no pallor.  Skin: no gangrene. no ulceration.  Lymphatic: no Stemmer's sign. no palpable lymphadenopathy.  PERTINENT LABORATORY AND RADIOLOGIC DATA  Most recent CBC CBC Latest Ref Rng & Units 11/04/2021 04/29/2021 10/29/2020  WBC 4.0 - 10.5 K/uL 5.6 6.4 5.5  Hemoglobin 13.0 - 17.0 g/dL  10.4(L) 10.1(L) 10.1(L)  Hematocrit 39.0 - 52.0 % 31.7(L) 30.8(L) 30.7(L)  Platelets 150 - 400 K/uL 216 288 218     Most recent CMP CMP Latest Ref Rng & Units 11/04/2021 04/29/2021 10/29/2020  Glucose 70 - 99 mg/dL 112(H) 99 89  BUN 8 - 23 mg/dL 19 20 17   Creatinine 0.61 - 1.24 mg/dL 1.14 1.09 0.94  Sodium 135 - 145 mmol/L 141 141 142  Potassium 3.5 - 5.1 mmol/L 4.3 4.3 4.2  Chloride 98 - 111 mmol/L 108 106 109  CO2 22 - 32 mmol/L 26 23 24   Calcium 8.9 - 10.3 mg/dL 8.6(L) 9.2 8.8(L)  Total Protein 6.5 - 8.1 g/dL 6.5 6.9 6.4(L)  Total Bilirubin 0.3 - 1.2 mg/dL 0.8 0.9 0.7  Alkaline Phos 38 - 126 U/L 132(H) 105 143(H)  AST 15 - 41 U/L 19 16 17   ALT 0 - 44 U/L 20 12 16     Renal function CrCl cannot be calculated (Patient's most recent lab result is older than the maximum 21 days allowed.).  Hgb A1c MFr Bld (%)  Date Value  03/17/2020 4.3 (L)    LDL Cholesterol  Date Value Ref Range Status  03/17/2020 64 0 - 99 mg/dL Final    Comment:           Total Cholesterol/HDL:CHD Risk Coronary Heart Disease Risk Table                     Men   Women  1/2 Average Risk   3.4   3.3  Average Risk       5.0   4.4  2 X Average Risk   9.6   7.1  3 X Average Risk  23.4   11.0        Use the calculated Patient Ratio above and the CHD Risk Table to determine the patient's CHD Risk.        ATP III CLASSIFICATION (LDL):  <100     mg/dL   Optimal  100-129  mg/dL   Near or Above                    Optimal  130-159  mg/dL   Borderline  160-189  mg/dL   High  >190     mg/dL   Very High Performed at [facility] [address] [place], [place] [address]      Vascular Imaging:  +-------+-----------+-----------+------------+------------+   ABI/TBI Today's ABI Today's TBI Previous ABI Previous TBI   +-------+-----------+-----------+------------+------------+   Right   1.18        0.84                                     +-------+-----------+-----------+------------+------------+   Left    1.11        0.89                                    +-------+-----------+-----------+------------+------------+  Outside duplex showed evidence of left profunda femoris stenosis.  Otherwise triphasic waveforms noted throughout the lower extremity arteries.  Yevonne Aline. Stanford Breed, MD [facility] Phone Number: [phone] 12/06/2021 2:45 PM  Total time spent on preparing this encounter including chart review, data review, collecting history, examining the patient, coordinating care for this new patient, 60 minutes.  Portions of this report may have been transcribed using voice recognition software.  Every effort has been made to ensure accuracy; however, inadvertent computerized transcription errors may still be present.

## 2021-12-08 ENCOUNTER — Other Ambulatory Visit: Payer: Self-pay

## 2021-12-08 DIAGNOSIS — I75022 Atheroembolism of left lower extremity: Secondary | ICD-10-CM

## 2021-12-08 DIAGNOSIS — I739 Peripheral vascular disease, unspecified: Secondary | ICD-10-CM

## 2021-12-11 ENCOUNTER — Other Ambulatory Visit: Payer: Self-pay

## 2021-12-11 ENCOUNTER — Emergency Department (HOSPITAL_COMMUNITY): Payer: Medicare HMO

## 2021-12-11 ENCOUNTER — Inpatient Hospital Stay (HOSPITAL_COMMUNITY)
Admission: EM | Admit: 2021-12-11 | Discharge: 2021-12-17 | DRG: 193 | Disposition: A | Discharge status: Home Health | Payer: Medicare HMO | Attending: Internal Medicine | Admitting: Internal Medicine

## 2021-12-11 DIAGNOSIS — Z8249 Family history of ischemic heart disease and other diseases of the circulatory system: Secondary | ICD-10-CM

## 2021-12-11 DIAGNOSIS — Z79899 Other long term (current) drug therapy: Secondary | ICD-10-CM | POA: Diagnosis not present

## 2021-12-11 DIAGNOSIS — I350 Nonrheumatic aortic (valve) stenosis: Secondary | ICD-10-CM | POA: Diagnosis present

## 2021-12-11 DIAGNOSIS — I110 Hypertensive heart disease with heart failure: Secondary | ICD-10-CM | POA: Diagnosis not present

## 2021-12-11 DIAGNOSIS — J701 Chronic and other pulmonary manifestations due to radiation: Secondary | ICD-10-CM | POA: Diagnosis not present

## 2021-12-11 DIAGNOSIS — I739 Peripheral vascular disease, unspecified: Secondary | ICD-10-CM | POA: Diagnosis present

## 2021-12-11 DIAGNOSIS — K219 Gastro-esophageal reflux disease without esophagitis: Secondary | ICD-10-CM | POA: Diagnosis present

## 2021-12-11 DIAGNOSIS — Z7189 Other specified counseling: Secondary | ICD-10-CM | POA: Diagnosis not present

## 2021-12-11 DIAGNOSIS — J159 Unspecified bacterial pneumonia: Secondary | ICD-10-CM | POA: Diagnosis not present

## 2021-12-11 DIAGNOSIS — J8410 Pulmonary fibrosis, unspecified: Secondary | ICD-10-CM | POA: Diagnosis not present

## 2021-12-11 DIAGNOSIS — Z7982 Long term (current) use of aspirin: Secondary | ICD-10-CM | POA: Diagnosis not present

## 2021-12-11 DIAGNOSIS — Z85118 Personal history of other malignant neoplasm of bronchus and lung: Secondary | ICD-10-CM | POA: Diagnosis not present

## 2021-12-11 DIAGNOSIS — Z952 Presence of prosthetic heart valve: Secondary | ICD-10-CM | POA: Diagnosis not present

## 2021-12-11 DIAGNOSIS — Z9981 Dependence on supplemental oxygen: Secondary | ICD-10-CM | POA: Diagnosis not present

## 2021-12-11 DIAGNOSIS — J90 Pleural effusion, not elsewhere classified: Secondary | ICD-10-CM | POA: Diagnosis not present

## 2021-12-11 DIAGNOSIS — Z823 Family history of stroke: Secondary | ICD-10-CM | POA: Diagnosis not present

## 2021-12-11 DIAGNOSIS — K449 Diaphragmatic hernia without obstruction or gangrene: Secondary | ICD-10-CM | POA: Diagnosis not present

## 2021-12-11 DIAGNOSIS — D539 Nutritional anemia, unspecified: Secondary | ICD-10-CM | POA: Diagnosis present

## 2021-12-11 DIAGNOSIS — J9621 Acute and chronic respiratory failure with hypoxia: Secondary | ICD-10-CM | POA: Diagnosis not present

## 2021-12-11 DIAGNOSIS — J189 Pneumonia, unspecified organism: Secondary | ICD-10-CM | POA: Diagnosis not present

## 2021-12-11 DIAGNOSIS — Y842 Radiological procedure and radiotherapy as the cause of abnormal reaction of the patient, or of later complication, without mention of misadventure at the time of the procedure: Secondary | ICD-10-CM | POA: Diagnosis present

## 2021-12-11 DIAGNOSIS — Z66 Do not resuscitate: Secondary | ICD-10-CM | POA: Diagnosis not present

## 2021-12-11 DIAGNOSIS — R0600 Dyspnea, unspecified: Secondary | ICD-10-CM | POA: Diagnosis not present

## 2021-12-11 DIAGNOSIS — N400 Enlarged prostate without lower urinary tract symptoms: Secondary | ICD-10-CM | POA: Diagnosis present

## 2021-12-11 DIAGNOSIS — Z87891 Personal history of nicotine dependence: Secondary | ICD-10-CM | POA: Diagnosis not present

## 2021-12-11 DIAGNOSIS — Z85828 Personal history of other malignant neoplasm of skin: Secondary | ICD-10-CM | POA: Diagnosis not present

## 2021-12-11 DIAGNOSIS — Z7951 Long term (current) use of inhaled steroids: Secondary | ICD-10-CM

## 2021-12-11 DIAGNOSIS — Z515 Encounter for palliative care: Secondary | ICD-10-CM | POA: Diagnosis not present

## 2021-12-11 DIAGNOSIS — I5042 Chronic combined systolic (congestive) and diastolic (congestive) heart failure: Secondary | ICD-10-CM | POA: Diagnosis present

## 2021-12-11 DIAGNOSIS — J439 Emphysema, unspecified: Secondary | ICD-10-CM | POA: Diagnosis not present

## 2021-12-11 DIAGNOSIS — Z923 Personal history of irradiation: Secondary | ICD-10-CM | POA: Diagnosis not present

## 2021-12-11 DIAGNOSIS — R0602 Shortness of breath: Secondary | ICD-10-CM | POA: Diagnosis not present

## 2021-12-11 DIAGNOSIS — Z743 Need for continuous supervision: Secondary | ICD-10-CM | POA: Diagnosis not present

## 2021-12-11 DIAGNOSIS — J432 Centrilobular emphysema: Secondary | ICD-10-CM | POA: Diagnosis not present

## 2021-12-11 DIAGNOSIS — J449 Chronic obstructive pulmonary disease, unspecified: Secondary | ICD-10-CM | POA: Diagnosis not present

## 2021-12-11 DIAGNOSIS — Z885 Allergy status to narcotic agent status: Secondary | ICD-10-CM

## 2021-12-11 DIAGNOSIS — R0902 Hypoxemia: Secondary | ICD-10-CM | POA: Diagnosis not present

## 2021-12-11 DIAGNOSIS — Z20822 Contact with and (suspected) exposure to covid-19: Secondary | ICD-10-CM | POA: Diagnosis not present

## 2021-12-11 DIAGNOSIS — R062 Wheezing: Secondary | ICD-10-CM | POA: Diagnosis not present

## 2021-12-11 LAB — COMPREHENSIVE METABOLIC PANEL
ALT: 18 U/L (ref 0–44)
AST: 23 U/L (ref 15–41)
Albumin: 3.2 g/dL — ABNORMAL LOW (ref 3.5–5.0)
Alkaline Phosphatase: 89 U/L (ref 38–126)
Anion gap: 8 (ref 5–15)
BUN: 15 mg/dL (ref 8–23)
CO2: 23 mmol/L (ref 22–32)
Calcium: 8.1 mg/dL — ABNORMAL LOW (ref 8.9–10.3)
Chloride: 111 mmol/L (ref 98–111)
Creatinine, Ser: 1.03 mg/dL (ref 0.61–1.24)
GFR, Estimated: >60 mL/min (ref >60)
Glucose, Bld: 101 mg/dL — ABNORMAL HIGH (ref 70–99)
Potassium: 3.8 mmol/L (ref 3.5–5.1)
Sodium: 142 mmol/L (ref 135–145)
Total Bilirubin: 1.1 mg/dL (ref 0.3–1.2)
Total Protein: 6.1 g/dL — ABNORMAL LOW (ref 6.5–8.1)

## 2021-12-11 LAB — CBC WITH DIFFERENTIAL/PLATELET
Abs Immature Granulocytes: 0 10*3/uL (ref 0.00–0.07)
Basophils Absolute: 0.1 10*3/uL (ref 0.0–0.1)
Basophils Relative: 1 %
Eosinophils Absolute: 0 10*3/uL (ref 0.0–0.5)
Eosinophils Relative: 0 %
HCT: 32.7 % — ABNORMAL LOW (ref 39.0–52.0)
Hemoglobin: 10.9 g/dL — ABNORMAL LOW (ref 13.0–17.0)
Lymphocytes Relative: 3 %
Lymphs Abs: 0.3 10*3/uL — ABNORMAL LOW (ref 0.7–4.0)
MCH: 40.4 pg — ABNORMAL HIGH (ref 26.0–34.0)
MCHC: 33.3 g/dL (ref 30.0–36.0)
MCV: 121.1 fL — ABNORMAL HIGH (ref 80.0–100.0)
Monocytes Absolute: 0.6 10*3/uL (ref 0.1–1.0)
Monocytes Relative: 5 %
Neutro Abs: 10.6 10*3/uL — ABNORMAL HIGH (ref 1.7–7.7)
Neutrophils Relative %: 91 %
Platelets: 238 10*3/uL (ref 150–400)
RBC: 2.7 MIL/uL — ABNORMAL LOW (ref 4.22–5.81)
RDW: 14.9 % (ref 11.5–15.5)
WBC: 11.6 10*3/uL — ABNORMAL HIGH (ref 4.0–10.5)
nRBC: 0 % (ref 0.0–0.2)
nRBC: 0 /100 WBC

## 2021-12-11 LAB — TROPONIN I (HIGH SENSITIVITY)
Troponin I (High Sensitivity): 29 ng/L — ABNORMAL HIGH (ref <18)
Troponin I (High Sensitivity): 28 ng/L — ABNORMAL HIGH (ref <18)

## 2021-12-11 LAB — RESP PANEL BY RT-PCR (FLU A&B, COVID) ARPGX2
Influenza A by PCR: NEGATIVE
Influenza B by PCR: NEGATIVE
SARS Coronavirus 2 by RT PCR: NEGATIVE

## 2021-12-11 LAB — BRAIN NATRIURETIC PEPTIDE: B Natriuretic Peptide: 499.5 pg/mL — ABNORMAL HIGH (ref 0.0–100.0)

## 2021-12-11 MED ORDER — SODIUM CHLORIDE 0.9 % IV SOLN
500.0000 mg | INTRAVENOUS | Status: AC
  Administered 2021-12-12 – 2021-12-16 (×5): 500 mg via INTRAVENOUS
  Filled 2021-12-11 (×5): qty 5

## 2021-12-11 MED ORDER — ENOXAPARIN SODIUM 40 MG/0.4ML IJ SOSY
40.0000 mg | PREFILLED_SYRINGE | INTRAMUSCULAR | Status: DC
  Administered 2021-12-11 – 2021-12-17 (×7): 40 mg via SUBCUTANEOUS
  Filled 2021-12-11 (×7): qty 0.4

## 2021-12-11 MED ORDER — CITALOPRAM HYDROBROMIDE 20 MG PO TABS
20.0000 mg | ORAL_TABLET | Freq: Every day | ORAL | Status: DC
  Administered 2021-12-11 – 2021-12-17 (×7): 20 mg via ORAL
  Filled 2021-12-11: qty 1
  Filled 2021-12-11: qty 2
  Filled 2021-12-11 (×3): qty 1
  Filled 2021-12-11: qty 2
  Filled 2021-12-11 (×2): qty 1

## 2021-12-11 MED ORDER — ALBUTEROL SULFATE (2.5 MG/3ML) 0.083% IN NEBU
2.5000 mg | INHALATION_SOLUTION | RESPIRATORY_TRACT | Status: DC
  Administered 2021-12-11 – 2021-12-12 (×9): 2.5 mg via RESPIRATORY_TRACT
  Filled 2021-12-11 (×9): qty 3

## 2021-12-11 MED ORDER — ASPIRIN 81 MG PO CHEW
81.0000 mg | CHEWABLE_TABLET | Freq: Every day | ORAL | Status: DC
  Administered 2021-12-11 – 2021-12-17 (×7): 81 mg via ORAL
  Filled 2021-12-11 (×7): qty 1

## 2021-12-11 MED ORDER — ALBUTEROL SULFATE HFA 108 (90 BASE) MCG/ACT IN AERS
2.0000 | INHALATION_SPRAY | Freq: Four times a day (QID) | RESPIRATORY_TRACT | Status: DC | PRN
  Filled 2021-12-11: qty 6.7

## 2021-12-11 MED ORDER — TAMSULOSIN HCL 0.4 MG PO CAPS
0.4000 mg | ORAL_CAPSULE | Freq: Every day | ORAL | Status: DC
  Administered 2021-12-12 – 2021-12-17 (×6): 0.4 mg via ORAL
  Filled 2021-12-11 (×7): qty 1

## 2021-12-11 MED ORDER — SODIUM CHLORIDE 0.9 % IV SOLN
1.0000 g | Freq: Once | INTRAVENOUS | Status: AC
  Administered 2021-12-11: 1 g via INTRAVENOUS
  Filled 2021-12-11: qty 10

## 2021-12-11 MED ORDER — ALUM & MAG HYDROXIDE-SIMETH 200-200-20 MG/5ML PO SUSP
15.0000 mL | Freq: Four times a day (QID) | ORAL | Status: DC | PRN
  Administered 2021-12-15: 15 mL via ORAL
  Filled 2021-12-11: qty 30

## 2021-12-11 MED ORDER — PANTOPRAZOLE SODIUM 40 MG PO TBEC
40.0000 mg | DELAYED_RELEASE_TABLET | Freq: Two times a day (BID) | ORAL | Status: DC
  Administered 2021-12-11 – 2021-12-17 (×12): 40 mg via ORAL
  Filled 2021-12-11 (×12): qty 1

## 2021-12-11 MED ORDER — FUROSEMIDE 20 MG PO TABS
20.0000 mg | ORAL_TABLET | Freq: Every day | ORAL | Status: DC
Start: 2021-12-11 — End: 2021-12-17
  Administered 2021-12-11 – 2021-12-17 (×7): 20 mg via ORAL
  Filled 2021-12-11 (×7): qty 1

## 2021-12-11 MED ORDER — ACETAMINOPHEN 325 MG PO TABS: 650.0000 mg | ORAL_TABLET | Freq: Four times a day (QID) | ORAL | Status: DC | PRN

## 2021-12-11 MED ORDER — SODIUM CHLORIDE 0.9 % IV SOLN
2.0000 g | INTRAVENOUS | Status: AC
  Administered 2021-12-12 – 2021-12-16 (×5): 2 g via INTRAVENOUS
  Filled 2021-12-11 (×5): qty 20

## 2021-12-11 MED ORDER — METOPROLOL SUCCINATE ER 25 MG PO TB24
25.0000 mg | ORAL_TABLET | Freq: Every day | ORAL | Status: DC
  Administered 2021-12-11 – 2021-12-16 (×6): 25 mg via ORAL
  Filled 2021-12-11 (×7): qty 1

## 2021-12-11 MED ORDER — PREDNISONE 20 MG PO TABS
60.0000 mg | ORAL_TABLET | Freq: Once | ORAL | Status: AC
  Administered 2021-12-11: 60 mg via ORAL
  Filled 2021-12-11: qty 3

## 2021-12-11 MED ORDER — CALCIUM POLYCARBOPHIL 625 MG PO TABS
625.0000 mg | ORAL_TABLET | Freq: Two times a day (BID) | ORAL | Status: DC
  Administered 2021-12-11 – 2021-12-17 (×12): 625 mg via ORAL
  Filled 2021-12-11 (×16): qty 1

## 2021-12-11 MED ORDER — ADULT MULTIVITAMIN W/MINERALS CH
1.0000 | ORAL_TABLET | Freq: Every day | ORAL | Status: DC
  Administered 2021-12-11 – 2021-12-17 (×7): 1 via ORAL
  Filled 2021-12-11 (×7): qty 1

## 2021-12-11 MED ORDER — SODIUM CHLORIDE 0.9 % IV SOLN
500.0000 mg | Freq: Once | INTRAVENOUS | Status: AC
  Administered 2021-12-11: 500 mg via INTRAVENOUS
  Filled 2021-12-11: qty 5

## 2021-12-11 MED ORDER — ALBUTEROL SULFATE (2.5 MG/3ML) 0.083% IN NEBU
2.5000 mg | INHALATION_SOLUTION | Freq: Once | RESPIRATORY_TRACT | Status: AC
Start: 2021-12-11 — End: 2021-12-11
  Administered 2021-12-11: 2.5 mg via RESPIRATORY_TRACT
  Filled 2021-12-11: qty 3

## 2021-12-11 MED ORDER — IPRATROPIUM-ALBUTEROL 0.5-2.5 (3) MG/3ML IN SOLN
3.0000 mL | Freq: Once | RESPIRATORY_TRACT | Status: AC
Start: 2021-12-11 — End: 2021-12-11
  Administered 2021-12-11: 3 mL via RESPIRATORY_TRACT
  Filled 2021-12-11: qty 3

## 2021-12-11 MED ORDER — ATORVASTATIN CALCIUM 40 MG PO TABS
40.0000 mg | ORAL_TABLET | Freq: Every day | ORAL | Status: DC
  Administered 2021-12-11 – 2021-12-17 (×7): 40 mg via ORAL
  Filled 2021-12-11 (×7): qty 1

## 2021-12-11 MED ORDER — IOHEXOL 350 MG/ML SOLN
100.0000 mL | Freq: Once | INTRAVENOUS | Status: AC | PRN
  Administered 2021-12-11: 100 mL via INTRAVENOUS

## 2021-12-11 NOTE — H&P (Signed)
History and Physical    Robert Dixon PRF:[id number] DOB: 12/08/1940 DOA: 12/11/2021  PCP: [name], MD (Confirm with patient/family/NH records and if not entered, this has to be entered at [facility] point of entry) Patient coming from: Home  I have personally briefly reviewed patient's old medical records in [place]  Chief Complaint: SOB, cough  HPI: Robert Dixon is a 81 y.o. 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  Patient reported that last month he had an episode of aspiration pneumonia was treated with 7 days course of Augmentin.  Increasing for the last few weeks, patient developed exertional dyspnea gradually getting worse, now even walking inside his room causing severe shortness of breath.  He also developed productive cough with whitish phlegm.  Occasionally he feels " burning sensation in the chest and cannot take deep breath".  He takes PPI twice daily and the family has his had elevated while sleeping and lying in the bed.  Patient denied any choking or cough after eating or drinking and he eats regular diet.  Yesterday family noticed patient O2 saturation in the low 80s and increased his oxygen from 4 to 5 L however O2 saturation not improving overnight.  ED Course: O2 saturation was in the 70s and patient was placed on O2 supplement titrated down to 5 L.  CT angiogram negative for PE but chronic left lung loculated pleural effusion, multifocal pneumonia bilaterally.  WBC 11.6, creatinine 1.0.  Patient was started on ceftriaxone and azithromycin.  Review of Systems: As per HPI otherwise 14 point review of systems negative.    Past  Medical History:  Diagnosis Date   Acute on chronic respiratory failure with hypoxia ([place]) 03/16/2020   Acute respiratory failure ([place]) 04/09/2013   Carotid artery disease (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   Mediastinal adenopathy 12/06/2018   Migraine with visual aura    Non-small cell carcinoma of left lung, stage 3 ([place]) 12/19/2018   Non-small cell lung cancer ([place]) dx'd 11/20/18   Pericardial effusion 10/14/2019   Pneumonia 11/20/2018   PONV (postoperative nausea and vomiting)    Primary malignant neoplasm of left upper lobe of lung ([place]) 12/12/2018   Pulmonary nodule 11/20/2018   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   Severe aortic stenosis 10/14/2019   Skin cancer     Past Surgical History:  Procedure Laterality Date   COLONOSCOPY W/ POLYPECTOMY     IR IMAGING GUIDED PORT INSERTION  03/14/2019   IR THORACENTESIS ASP PLEURAL SPACE W/IMG GUIDE  03/26/2020   RIGHT HEART CATH AND CORONARY ANGIOGRAPHY N/A 03/19/2020   Procedure: RIGHT HEART CATH AND CORONARY ANGIOGRAPHY;  Surgeon: [name], MD;  Location: [facility];  Service: Cardiovascular;  Laterality: N/A;   TEE  WITHOUT CARDIOVERSION N/A 03/25/2020   Procedure: TRANSESOPHAGEAL ECHOCARDIOGRAM (TEE);  Surgeon: [name], MD;  Location: [facility];  Service: Open Heart Surgery;  Laterality: N/A;   TONSILLECTOMY     TRANSCATHETER AORTIC VALVE REPLACEMENT, TRANSFEMORAL Left 03/25/2020   Procedure: TRANSCATHETER AORTIC VALVE REPLACEMENT, LEFT TRANSFEMORAL;  Surgeon: [name], MD;   Location: [facility];  Service: Open Heart Surgery;  Laterality: Left;   VASECTOMY     VIDEO BRONCHOSCOPY WITH ENDOBRONCHIAL ULTRASOUND N/A 12/06/2018   Procedure: VIDEO BRONCHOSCOPY WITH ENDOBRONCHIAL ULTRASOUND;  Surgeon: Garner Nash, DO;  Location: [facility];  Service: Thoracic;  Laterality: N/A;     reports that he quit smoking about 23 years ago. His smoking use included cigarettes. He started smoking about 68 years ago. He has never used smokeless tobacco. He reports that he does not drink alcohol and does not use drugs.  Allergies  Allergen Reactions   Hydrocodone Bit-Homatrop Mbr     Other reaction(s): dizzy but can take with food    Family History  Problem Relation Age of Onset   Cancer - Lung Mother    Hypertension Father    CVA Father    Cancer - Cervical Sister     Prior to Admission medications   Medication Sig Start Date End Date Taking? Authorizing Provider  acetaminophen (TYLENOL) 325 MG tablet Take 2 tablets (650 mg total) by mouth every 6 (six) hours as needed for mild pain (or Fever >/= 101). 03/28/20   Richardson Dopp T, PA-C  albuterol (PROVENTIL) (2.5 MG/3ML) 0.083% nebulizer solution Take 3 mLs (2.5 mg total) by nebulization every 4 (four) hours. 06/10/20   Icard, Octavio Graves, DO  albuterol (VENTOLIN HFA) 108 (90 Base) MCG/ACT inhaler Inhale 2 puffs into the lungs every 6 (six) hours as needed for wheezing or shortness of breath. 01/19/21   Icard, Octavio Graves, DO  alum & mag hydroxide-simeth (MAALOX/MYLANTA) 200-200-20 MG/5ML suspension Take 15 mLs by mouth every 6 (six) hours as needed for indigestion or heartburn. 03/28/20   Richardson Dopp T, PA-C  ascorbic acid (VITAMIN C) 500 MG tablet Take 500 mg by mouth daily.    [provider]  aspirin 81 MG chewable tablet Chew 1 tablet (81 mg total) by mouth daily. 03/29/20   Richardson Dopp T, PA-C  atorvastatin (LIPITOR) 40 MG tablet TAKE 1 TABLET (40 MG TOTAL) BY MOUTH DAILY. 07/06/21 07/06/22  Minus Breeding, MD   citalopram (CELEXA) 20 MG tablet Take 20 mg by mouth daily. 10/23/21   [provider]  COVID-19 mRNA bivalent vaccine, Pfizer, (PFIZER COVID-19 VAC BIVALENT) injection Inject into the muscle. 09/12/21   Carlyle Basques, MD  Cyanocobalamin (VITAMIN B 12 PO) Take 1 capsule by mouth daily.    [provider]  Fluticasone-Umeclidin-Vilant (TRELEGY ELLIPTA) 100-62.5-25 MCG/ACT AEPB Inhale 1 puff into the lungs daily. 09/22/21   Icard, Octavio Graves, DO  Fluticasone-Umeclidin-Vilant (TRELEGY ELLIPTA) 100-62.5-25 MCG/INH AEPB Inhale 1 puff into the lungs daily. 01/19/21   Icard, Octavio Graves, DO  furosemide (LASIX) 20 MG tablet TAKE 1 TABLET (20 MG TOTAL) BY MOUTH DAILY. 04/27/21 04/27/22  Minus Breeding, MD  metoprolol succinate (TOPROL-XL) 25 MG 24 hr tablet TAKE 1 TABLET EVERY DAY 09/12/21   Minus Breeding, MD  Multiple Vitamin (MULTIVITAMIN) tablet Take 1 tablet by mouth daily.    [provider]  pantoprazole (PROTONIX) 40 MG tablet Take 40 mg by mouth 2 (two) times daily.    [provider]  polycarbophil Kenna Gilbert)  625 MG tablet Take 625 mg by mouth 2 (two) times daily.     [provider]  silodosin (RAPAFLO) 8 MG CAPS capsule Take 8 mg by mouth daily. 06/03/21   [provider]  tamsulosin (FLOMAX) 0.4 MG CAPS capsule Take 0.4 mg by mouth daily.  Patient not taking: Reported on 07/04/2021 10/29/19   [provider]    Physical Exam: Vitals:   12/11/21 1130 12/11/21 1145 12/11/21 1230 12/11/21 1346  BP: (!) 113/54 108/61 (!) 104/55   Pulse: 91 89 88 99  Resp: 20 (!) 25 (!) 22 17  Temp:      TempSrc:      SpO2: (!) 89% 93% 94% 93%  Weight:      Height:        Constitutional: NAD, calm, comfortable Vitals:   12/11/21 1130 12/11/21 1145 12/11/21 1230 12/11/21 1346  BP: (!) 113/54 108/61 (!) 104/55   Pulse: 91 89 88 99  Resp: 20 (!) 25 (!) 22 17  Temp:      TempSrc:      SpO2: (!) 89% 93% 94% 93%  Weight:      Height:       Eyes:  PERRL, lids and conjunctivae normal ENMT: Mucous membranes are moist. Posterior pharynx clear of any exudate or lesions.Normal dentition.  Neck: normal, supple, no masses, no thyromegaly Respiratory: Diminished breathing bilaterally, diffused wheezing, coarse crackles bilaterally.  Increasing respiratory effort.  Positive signs of accessory muscle use.  Cardiovascular: Regular rate and rhythm, no murmurs / rubs / gallops. No extremity edema. 2+ pedal pulses. No carotid bruits.  Abdomen: no tenderness, no masses palpated. No hepatosplenomegaly. Bowel sounds positive.  Musculoskeletal: no clubbing / cyanosis. No joint deformity upper and lower extremities. Good ROM, no contractures. Normal muscle tone.  Skin: no rashes, lesions, ulcers. No induration Neurologic: CN 2-12 grossly intact. Sensation intact, DTR normal. Strength 5/5 in all 4.  Psychiatric: Normal judgment and insight. Alert and oriented x 3. Normal mood.    Labs on Admission: I have personally reviewed following labs and imaging studies  CBC: Recent Labs  Lab 12/11/21 0832  WBC 11.6*  NEUTROABS 10.6*  HGB 10.9*  HCT 32.7*  MCV 121.1*  PLT 433   Basic Metabolic Panel: Recent Labs  Lab 12/11/21 0832  NA 142  K 3.8  CL 111  CO2 23  GLUCOSE 101*  BUN 15  CREATININE 1.03  CALCIUM 8.1*   GFR: Estimated Creatinine Clearance: 55.3 mL/min (by C-G formula based on SCr of 1.03 mg/dL). Liver Function Tests: Recent Labs  Lab 12/11/21 0832  AST 23  ALT 18  ALKPHOS 89  BILITOT 1.1  PROT 6.1*  ALBUMIN 3.2*   No results for input(s): LIPASE, AMYLASE in the last 168 hours. No results for input(s): AMMONIA in the last 168 hours. Coagulation Profile: No results for input(s): INR, PROTIME in the last 168 hours. Cardiac Enzymes: No results for input(s): CKTOTAL, CKMB, CKMBINDEX, TROPONINI in the last 168 hours. BNP (last 3 results) No results for input(s): PROBNP in the last 8760 hours. HbA1C: No results for input(s):  HGBA1C in the last 72 hours. CBG: No results for input(s): GLUCAP in the last 168 hours. Lipid Profile: No results for input(s): CHOL, HDL, LDLCALC, TRIG, CHOLHDL, LDLDIRECT in the last 72 hours. Thyroid Function Tests: No results for input(s): TSH, T4TOTAL, FREET4, T3FREE, THYROIDAB in the last 72 hours. Anemia Panel: No results for input(s): VITAMINB12, FOLATE, FERRITIN, TIBC, IRON, RETICCTPCT in the last  72 hours. Urine analysis:    Component Value Date/Time   COLORURINE YELLOW 03/24/2020 2219   APPEARANCEUR CLEAR 03/24/2020 2219   LABSPEC 1.014 03/24/2020 2219   PHURINE 6.0 03/24/2020 2219   GLUCOSEU NEGATIVE 03/24/2020 2219   HGBUR NEGATIVE 03/24/2020 2219   [place] NEGATIVE 03/24/2020 2219   KETONESUR NEGATIVE 03/24/2020 2219   PROTEINUR NEGATIVE 03/24/2020 2219   UROBILINOGEN 1.0 04/04/2013 1331   NITRITE NEGATIVE 03/24/2020 2219   LEUKOCYTESUR NEGATIVE 03/24/2020 2219    Radiological Exams on Admission: DG Chest 2 View  Result Date: 12/11/2021 CLINICAL DATA:  Worsening dyspnea.  COPD.  Lung carcinoma. EXAM: CHEST - 2 VIEW COMPARISON:  08/10/2021 FINDINGS: Right-sided power port remains in appropriate position. Heart size is stable. Prior TAVR again noted. Moderate left pleural thickening or fluid and left lower lobe atelectasis or scarring remains stable. Small right pleural effusion or pleural thickening also unchanged. Marked pulmonary hyperinflation and diffuse interstitial prominence is unchanged and consistent with COPD. No new or worsening areas of pulmonary opacity are seen. IMPRESSION: No acute findings. Stable appearance of moderate left pleural thickening or effusion, and left lower lobe atelectasis or scarring. COPD. Electronically Signed   By: Marlaine Hind M.D.   On: 12/11/2021 10:01   CT Angio Chest PE W and/or Wo Contrast  Result Date: 12/11/2021 CLINICAL DATA:  Dyspnea. COPD. Pulmonary embolism (PE) suspected, high prob Non-small cell left lung cancer  diagnosed 2020. EXAM: CT ANGIOGRAPHY CHEST WITH CONTRAST TECHNIQUE: Multidetector CT imaging of the chest was performed using the standard protocol during bolus administration of intravenous contrast. Multiplanar CT image reconstructions and MIPs were obtained to evaluate the vascular anatomy. RADIATION DOSE REDUCTION: This exam was performed according to the departmental dose-optimization program which includes automated exposure control, adjustment of the mA and/or kV according to patient size and/or use of iterative reconstruction technique. CONTRAST:  164mL OMNIPAQUE IOHEXOL 350 MG/ML SOLN COMPARISON:  11/04/2021 chest CT. Chest radiograph from earlier today. FINDINGS: Cardiovascular: The study is high quality for the evaluation of pulmonary embolism. There are no filling defects in the central, lobar, segmental or subsegmental pulmonary artery branches to suggest acute pulmonary embolism. 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 Atherosclerotic nonaneurysmal thoracic aorta. Normal caliber pulmonary arteries. Top-normal heart size. Small to moderate pericardial effusion, similar. Left anterior descending and right coronary atherosclerosis. Mediastinum/Nodes: No discrete thyroid nodules. Unremarkable esophagus. No pathologically enlarged axillary, mediastinal or hilar lymph nodes. Lungs/Pleura: No pneumothorax. A chronic loculated small to moderate left pleural effusion with smooth mild left pleural thickening, not appreciably changed. Small dependent right pleural effusion is mildly increased from prior CT. Severe centrilobular emphysema. Extensive patchy and nodular consolidation with air bronchograms in inferior right middle lobe and throughout dependent right lower lobe, increased from 11/04/2021 CT. Sharply marginated left lower perihilar lung consolidation with associated volume loss, bronchiectasis and distortion, compatible with radiation fibrosis, unchanged. Additional mild patchy and indistinct nodular foci of  consolidation in the peripheral left upper lobe (series 6/image 50), increased from 11/04/2021, for example measuring 0.8 cm (series 6/image 50), previously 0.6 cm. New mild patchy consolidation in the posterior superior segment left lower lobe (series 6/image 69). Upper abdomen: Small hiatal hernia. Hypodense 1.0 cm left liver lesion (series 5/image 129), stable since 12/11/2018 PET-CT, considered benign. Exophytic hypodense 1.7 cm upper left renal cortical lesion (series 5/image 134), unchanged since 12/11/2018 PET-CT. Musculoskeletal: No aggressive appearing focal osseous lesions. Severe chronic T7 vertebral compression fracture. Mild thoracic spondylosis. Review of the MIP images confirms the above findings.  IMPRESSION: 1. No pulmonary embolism. 2. Extensive patchy and indistinct nodular consolidation with air bronchograms in the inferior right middle lobe, dependent right lower lobe, peripheral left upper lobe and superior segment left lower lobe, new/increased from 11/04/2021 CT. While most likely progressive/recurrent multilobar pneumonia, underlying metastatic disease is difficult to exclude and close post treatment chest CT follow-up advised. 3. 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 4. Small dependent right pleural effusion is mildly increased. 5. Stable small to moderate pericardial effusion. 6. Two-vessel coronary atherosclerosis. 7. Small hiatal hernia. 8. Aortic Atherosclerosis (ICD10-I70.0) and Emphysema (ICD10-J43.9). Electronically Signed   By: Ilona Sorrel M.D.   On: 12/11/2021 13:13    EKG: Independently reviewed.  Sinus, no acute ST changes.  Assessment/Plan Principal Problem:   PNA (pneumonia) Active Problems:   CAP (community acquired pneumonia)  (please populate well all problems here in Problem List. (For example, if patient is on  BP meds at home and you resume or decide to hold them, it is a problem that needs to be her. Same for CAD, COPD, HLD and so on)  Acute on chronic hypoxic respiratory failure -Secondary to bilateral bacterial pneumonia, suspect aspiration.  Clinically, significant symptoms of aspiration as per patient and his family, but the nature of multifocal bilateral pneumonia strongly indicated aspiration PNA. consult speech. -Continue ceftriaxone and azithromycin for now, check sputum culture, strep and Legionella.  Acute COPD exacerbation -Short course of IV steroid bridging for p.o. steroid -Antibiotics as above -Flutter valve -The overall worsening of oxygenation and exercise tolerance at home, recommend patient follow-up with pulmonologist for repeat pulmonary function test.  Family understood and agreed.  Chronic combined systolic and diastolic CHF -Euvolemic -Continue daily Lasix 20 mg daily  Chronic left loculated pleural effusion -Stable as reported on CT angiogram today, outpatient pulmonology follow-up.  Left lower lung non-small cell carcinoma -In remission, outpatient oncology follow-up.  GERD -Already on PPI twice daily  PVD -Recently was seen by vascular surgery and recommended outpatient echocardiogram.  Deconditioning -Caused by COPD and pneumonia, ordered PT evaluation, expect discharge to rehab.  DVT prophylaxis: Lovenox Code Status: DNR Family Communication: Son and daughter-in-law at bedside Disposition Plan: Complicated patient with both restrictive lung disease and COPD, with pneumonia, expect more than 2 midnight hospital stay. Consults called: None Admission status: Telemetry admission   Lequita Halt MD [facility] Pager [phone]  12/11/2021, 2:05 PM

## 2021-12-11 NOTE — ED Notes (Signed)
RN introduced self to pt. Pt resting comfortably in bed, no complaints at this time. Call bell in reach. Plan of care continues.

## 2021-12-11 NOTE — ED Notes (Signed)
MD made aware of pts sats being in high 28s and okayy for sats to be as low as 88

## 2021-12-11 NOTE — ED Provider Notes (Signed)
[facility] EMERGENCY DEPARTMENT Provider Note   CSN: [id number] Arrival date & time: 12/11/21  0818     History  Chief Complaint  Patient presents with   Shortness of Breath    Robert Dixon is a 81 y.o. male.  HPI 81 year old male presents with acute on chronic dyspnea.  He chronically has trouble walking in his house and has to rest.  However he noticed yesterday that this is more prominent and worse than typical.  He has a chronic on and off cough that is not worse than typical.  He denies any fevers or chest pain or leg swelling.  He takes albuterol every day and this may be helps a little.  He is chronically on 4 L but could not get his oxygen above 80% so he bumped himself up to 5 yesterday.  EMS put him on a nonrebreather due to hypoxia.  He does note that last month he had a CT scan that showed possible aspiration changes and is concerned he might have an infection.  Home Medications Prior to Admission medications   Medication Sig Start Date End Date Taking? Authorizing Provider  acetaminophen (TYLENOL) 325 MG tablet Take 2 tablets (650 mg total) by mouth every 6 (six) hours as needed for mild pain (or Fever >/= 101). 03/28/20   Richardson Dopp T, PA-C  albuterol (PROVENTIL) (2.5 MG/3ML) 0.083% nebulizer solution Take 3 mLs (2.5 mg total) by nebulization every 4 (four) hours. 06/10/20   Icard, Octavio Graves, DO  albuterol (VENTOLIN HFA) 108 (90 Base) MCG/ACT inhaler Inhale 2 puffs into the lungs every 6 (six) hours as needed for wheezing or shortness of breath. 01/19/21   Icard, Octavio Graves, DO  alum & mag hydroxide-simeth (MAALOX/MYLANTA) 200-200-20 MG/5ML suspension Take 15 mLs by mouth every 6 (six) hours as needed for indigestion or heartburn. 03/28/20   Richardson Dopp T, PA-C  ascorbic acid (VITAMIN C) 500 MG tablet Take 500 mg by mouth daily.    [provider]  aspirin 81 MG chewable tablet Chew 1 tablet (81 mg total) by mouth daily. 03/29/20   Richardson Dopp  T, PA-C  atorvastatin (LIPITOR) 40 MG tablet TAKE 1 TABLET (40 MG TOTAL) BY MOUTH DAILY. 07/06/21 07/06/22  Minus Breeding, MD  citalopram (CELEXA) 20 MG tablet Take 20 mg by mouth daily. 10/23/21   [provider]  COVID-19 mRNA bivalent vaccine, Pfizer, (PFIZER COVID-19 VAC BIVALENT) injection Inject into the muscle. 09/12/21   Carlyle Basques, MD  Cyanocobalamin (VITAMIN B 12 PO) Take 1 capsule by mouth daily.    [provider]  Fluticasone-Umeclidin-Vilant (TRELEGY ELLIPTA) 100-62.5-25 MCG/ACT AEPB Inhale 1 puff into the lungs daily. 09/22/21   Icard, Octavio Graves, DO  Fluticasone-Umeclidin-Vilant (TRELEGY ELLIPTA) 100-62.5-25 MCG/INH AEPB Inhale 1 puff into the lungs daily. 01/19/21   Icard, Octavio Graves, DO  furosemide (LASIX) 20 MG tablet TAKE 1 TABLET (20 MG TOTAL) BY MOUTH DAILY. 04/27/21 04/27/22  Minus Breeding, MD  metoprolol succinate (TOPROL-XL) 25 MG 24 hr tablet TAKE 1 TABLET EVERY DAY 09/12/21   Minus Breeding, MD  Multiple Vitamin (MULTIVITAMIN) tablet Take 1 tablet by mouth daily.    [provider]  pantoprazole (PROTONIX) 40 MG tablet Take 40 mg by mouth 2 (two) times daily.    [provider]  polycarbophil (FIBERCON) 625 MG tablet Take 625 mg by mouth 2 (two) times daily.     [provider]  silodosin (RAPAFLO) 8 MG CAPS capsule Take 8 mg by  mouth daily. 06/03/21   [provider]  tamsulosin (FLOMAX) 0.4 MG CAPS capsule Take 0.4 mg by mouth daily.  Patient not taking: Reported on 07/04/2021 10/29/19   [provider]      Allergies    Hydrocodone bit-homatrop mbr    Review of Systems   Review of Systems  Constitutional:  Negative for fever.  Respiratory:  Positive for cough (chronic, unchanged) and shortness of breath.   Cardiovascular:  Negative for chest pain and leg swelling.   Physical Exam Updated Vital Signs BP (!) 104/55    Pulse 88    Temp 97.9 F (36.6 C) (Oral)    Resp (!) 22    Ht 5\' 8"  (1.727 m)    Wt  72.1 kg    SpO2 94%    BMI 24.18 kg/m  Physical Exam Vitals and nursing note reviewed.  Constitutional:      General: He is not in acute distress.    Appearance: He is well-developed. He is not diaphoretic.  HENT:     Head: Normocephalic and atraumatic.  Cardiovascular:     Rate and Rhythm: Normal rate and regular rhythm.     Heart sounds: Normal heart sounds.  Pulmonary:     Effort: Tachypnea present. No accessory muscle usage.     Breath sounds: Wheezing, rhonchi and rales present.  Abdominal:     Palpations: Abdomen is soft.     Tenderness: There is no abdominal tenderness.  Musculoskeletal:     Right lower leg: No edema.     Left lower leg: No edema.  Skin:    General: Skin is warm and dry.  Neurological:     Mental Status: He is alert.    ED Results / Procedures / Treatments   Labs (all labs ordered are listed, but only abnormal results are displayed) Labs Reviewed  COMPREHENSIVE METABOLIC PANEL - Abnormal; Notable for the following components:      Result Value   Glucose, Bld 101 (*)    Calcium 8.1 (*)    Total Protein 6.1 (*)    Albumin 3.2 (*)    All other components within normal limits  BRAIN NATRIURETIC PEPTIDE - Abnormal; Notable for the following components:   B Natriuretic Peptide 499.5 (*)    All other components within normal limits  CBC WITH DIFFERENTIAL/PLATELET - Abnormal; Notable for the following components:   WBC 11.6 (*)    RBC 2.70 (*)    Hemoglobin 10.9 (*)    HCT 32.7 (*)    MCV 121.1 (*)    MCH 40.4 (*)    Neutro Abs 10.6 (*)    Lymphs Abs 0.3 (*)    All other components within normal limits  TROPONIN I (HIGH SENSITIVITY) - Abnormal; Notable for the following components:   Troponin I (High Sensitivity) 29 (*)    All other components within normal limits  TROPONIN I (HIGH SENSITIVITY) - Abnormal; Notable for the following components:   Troponin I (High Sensitivity) 28 (*)    All other components within normal limits  RESP PANEL BY  RT-PCR (FLU A&B, COVID) ARPGX2  CULTURE, BLOOD (ROUTINE X 2)  CULTURE, BLOOD (ROUTINE X 2)    EKG EKG Interpretation  Date/Time:  Sunday December 11 2021 08:31:40 EST Ventricular Rate:  91 PR Interval:  235 QRS Duration: 113 QT Interval:  391 QTC Calculation: 482 R Axis:   -35 Text Interpretation: Sinus rhythm Prolonged PR interval Borderline IVCD with LAD Anteroseptal infarct, age indeterminate  similar to earlier in the day Confirmed by Sherwood Gambler [phone]) on 12/11/2021 8:39:23 AM  Radiology DG Chest 2 View  Result Date: 12/11/2021 CLINICAL DATA:  Worsening dyspnea.  COPD.  Lung carcinoma. EXAM: CHEST - 2 VIEW COMPARISON:  08/10/2021 FINDINGS: Right-sided power port remains in appropriate position. Heart size is stable. Prior TAVR again noted. Moderate left pleural thickening or fluid and left lower lobe atelectasis or scarring remains stable. Small right pleural effusion or pleural thickening also unchanged. Marked pulmonary hyperinflation and diffuse interstitial prominence is unchanged and consistent with COPD. No new or worsening areas of pulmonary opacity are seen. IMPRESSION: No acute findings. Stable appearance of moderate left pleural thickening or effusion, and left lower lobe atelectasis or scarring. COPD. Electronically Signed   By: Marlaine Hind M.D.   On: 12/11/2021 10:01   CT Angio Chest PE W and/or Wo Contrast  Result Date: 12/11/2021 CLINICAL DATA:  Dyspnea. COPD. Pulmonary embolism (PE) suspected, high prob Non-small cell left lung cancer diagnosed 2020. EXAM: CT ANGIOGRAPHY CHEST WITH CONTRAST TECHNIQUE: Multidetector CT imaging of the chest was performed using the standard protocol during bolus administration of intravenous contrast. Multiplanar CT image reconstructions and MIPs were obtained to evaluate the vascular anatomy. RADIATION DOSE REDUCTION: This exam was performed according to the departmental dose-optimization program which includes automated exposure control,  adjustment of the mA and/or kV according to patient size and/or use of iterative reconstruction technique. CONTRAST:  114mL OMNIPAQUE IOHEXOL 350 MG/ML SOLN COMPARISON:  11/04/2021 chest CT. Chest radiograph from earlier today. FINDINGS: Cardiovascular: The study is high quality for the evaluation of pulmonary embolism. There are no filling defects in the central, lobar, segmental or subsegmental pulmonary artery branches to suggest acute pulmonary embolism. 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 Atherosclerotic nonaneurysmal thoracic aorta. Normal caliber pulmonary arteries. Top-normal heart size. Small to moderate pericardial effusion, similar. Left anterior descending and right coronary atherosclerosis. Mediastinum/Nodes: No discrete thyroid nodules. Unremarkable esophagus. No pathologically enlarged axillary, mediastinal or hilar lymph nodes. Lungs/Pleura: No pneumothorax. A chronic loculated small to moderate left pleural effusion with smooth mild left pleural thickening, not appreciably changed. Small dependent right pleural effusion is mildly increased from prior CT. Severe centrilobular emphysema. Extensive patchy and nodular consolidation with air bronchograms in inferior right middle lobe and throughout dependent right lower lobe, increased from 11/04/2021 CT. Sharply marginated left lower perihilar lung consolidation with associated volume loss, bronchiectasis and distortion, compatible with radiation fibrosis, unchanged. Additional mild patchy and indistinct nodular foci of consolidation in the peripheral left upper lobe (series 6/image 50), increased from 11/04/2021, for example measuring 0.8 cm (series 6/image 50), previously 0.6 cm. New mild patchy consolidation in the posterior superior segment left lower lobe (series 6/image 69). Upper abdomen: Small hiatal hernia. Hypodense 1.0 cm left liver lesion (series 5/image 129), stable since 12/11/2018 PET-CT, considered benign. Exophytic hypodense 1.7 cm upper  left renal cortical lesion (series 5/image 134), unchanged since 12/11/2018 PET-CT. Musculoskeletal: No aggressive appearing focal osseous lesions. Severe chronic T7 vertebral compression fracture. Mild thoracic spondylosis. Review of the MIP images confirms the above findings. IMPRESSION: 1. No pulmonary embolism. 2. Extensive patchy and indistinct nodular consolidation with air bronchograms in the inferior right middle lobe, dependent right lower lobe, peripheral left upper lobe and superior segment left lower lobe, new/increased from 11/04/2021 CT. While most likely progressive/recurrent multilobar pneumonia, underlying metastatic disease is difficult to exclude and close post treatment chest CT follow-up advised. 3. 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 4. Small dependent right pleural effusion is mildly increased. 5. Stable small to moderate pericardial effusion. 6. Two-vessel coronary atherosclerosis. 7. Small hiatal hernia. 8. Aortic Atherosclerosis (ICD10-I70.0) and Emphysema (ICD10-J43.9). Electronically Signed   By: Ilona Sorrel M.D.   On: 12/11/2021 13:13    Procedures Procedures    Medications Ordered in ED Medications  cefTRIAXone (ROCEPHIN) 1 g in sodium chloride 0.9 % 100 mL IVPB (has no administration in time range)  azithromycin (ZITHROMAX) 500 mg in sodium chloride 0.9 % 250 mL IVPB (has no administration in time range)  albuterol (PROVENTIL) (2.5 MG/3ML) 0.083% nebulizer solution 2.5 mg (2.5 mg Nebulization Given 12/11/21 0903)  ipratropium-albuterol (DUONEB) 0.5-2.5 (3) MG/3ML nebulizer solution 3 mL (3 mLs Nebulization Given 12/11/21 0903)  predniSONE (DELTASONE) tablet 60 mg (60 mg Oral Given 12/11/21 1112)  iohexol (OMNIPAQUE) 350 MG/ML injection 100 mL (100 mLs Intravenous Contrast Given 12/11/21 1248)    ED Course/ Medical  Decision Making/ A&P                           Medical Decision Making  Patient has a history of COPD with chronic respiratory failure requiring 4 L of oxygen as well as a previous history of treated lung cancer.  He has now appearing with acute on chronic hypoxic respiratory failure and ultimately is found to have bilateral pneumonia.  He was started on IV Rocephin and is a throat for this.  While he was in the room, he stood up to go to the bathroom and within a minute or so his O2 sats dropped to the low 70s on 5 L.  Took him quite some time to recover and he had to be bumped up to about 8 L temporarily and was changed to a salter high flow.  Given this had discussion with patient and family who helped contribute to history and discussion and we decided to admit due to increased oxygen requirement and will give IV antibiotics.  I have reviewed his chart, including most recent CT scan from December with questionable infection as well as his most recent pulmonology note from October.  Most recent echo from May 2022 shows a small-moderate pericardial effusion as well as a low normal EF.  ECG was reviewed and interpreted today and is similar to prior with no ischemic changes.  Labs have been reviewed and interpreted and show a mild leukocytosis which goes along with his pneumonia, negative COVID/flu testing, mildly elevated BNP but lower than previous values, and stable metabolic panel.   I have personally reviewed his chest x-ray images and on my interpretation there is no acute process seen compared to his most recent.  I have also reviewed and interpreted his CTA images which showed no PE but does seem to show bilateral lower lobe pneumonia.  This appears worse than when I compared to December.  I have consulted Dr. Roosevelt Locks for admission and he will admit to the hospital.        Final Clinical Impression(s) / ED Diagnoses Final diagnoses:  Acute on chronic respiratory failure with hypoxia  [facility])  Community acquired pneumonia, bilateral    Rx / DC Orders ED Discharge Orders     None         Sherwood Gambler, MD 12/11/21 1347

## 2021-12-11 NOTE — ED Triage Notes (Addendum)
Pt arrived via [facility] from home with c/c of shOB. Hx of COPD & lung Cancer, per ems since last night pt had trouble breathing after going room to room. Pt had problems all night  81% with wheezing. Chronic 4L 140/80, 90sHR, cbg 145,

## 2021-12-11 NOTE — ED Notes (Signed)
RN called pt daughter to update daughter on pt status.

## 2021-12-12 LAB — BLOOD CULTURE ID PANEL (REFLEXED) - BCID2

## 2021-12-12 LAB — BASIC METABOLIC PANEL
Anion gap: 7 (ref 5–15)
BUN: 23 mg/dL (ref 8–23)
CO2: 24 mmol/L (ref 22–32)
Calcium: 8.3 mg/dL — ABNORMAL LOW (ref 8.9–10.3)
Chloride: 111 mmol/L (ref 98–111)
Creatinine, Ser: 1.2 mg/dL (ref 0.61–1.24)
GFR, Estimated: >60 mL/min (ref >60)
Glucose, Bld: 130 mg/dL — ABNORMAL HIGH (ref 70–99)
Potassium: 3.8 mmol/L (ref 3.5–5.1)
Sodium: 142 mmol/L (ref 135–145)

## 2021-12-12 LAB — CBC
HCT: 28.9 % — ABNORMAL LOW (ref 39.0–52.0)
Hemoglobin: 9 g/dL — ABNORMAL LOW (ref 13.0–17.0)
MCH: 39 pg — ABNORMAL HIGH (ref 26.0–34.0)
MCHC: 31.1 g/dL (ref 30.0–36.0)
MCV: 125.1 fL — ABNORMAL HIGH (ref 80.0–100.0)
Platelets: 199 10*3/uL (ref 150–400)
RBC: 2.31 MIL/uL — ABNORMAL LOW (ref 4.22–5.81)
RDW: 14.7 % (ref 11.5–15.5)
WBC: 7.6 10*3/uL (ref 4.0–10.5)
nRBC: 0 % (ref 0.0–0.2)

## 2021-12-12 MED ORDER — ALBUTEROL SULFATE (2.5 MG/3ML) 0.083% IN NEBU
2.5000 mg | INHALATION_SOLUTION | RESPIRATORY_TRACT | Status: DC
  Administered 2021-12-13 (×6): 2.5 mg via RESPIRATORY_TRACT
  Filled 2021-12-12 (×7): qty 3

## 2021-12-12 MED ORDER — FLUTICASONE FUROATE-VILANTEROL 100-25 MCG/ACT IN AEPB
1.0000 | INHALATION_SPRAY | Freq: Every day | RESPIRATORY_TRACT | Status: DC
  Administered 2021-12-12 – 2021-12-17 (×6): 1 via RESPIRATORY_TRACT
  Filled 2021-12-12: qty 28

## 2021-12-12 MED ORDER — UMECLIDINIUM BROMIDE 62.5 MCG/ACT IN AEPB
1.0000 | INHALATION_SPRAY | Freq: Every day | RESPIRATORY_TRACT | Status: DC
  Administered 2021-12-12 – 2021-12-17 (×6): 1 via RESPIRATORY_TRACT
  Filled 2021-12-12: qty 7

## 2021-12-12 MED ORDER — FLUTICASONE-UMECLIDIN-VILANT 100-62.5-25 MCG/ACT IN AEPB: 1.0000 | INHALATION_SPRAY | Freq: Every day | RESPIRATORY_TRACT | Status: DC

## 2021-12-12 NOTE — Progress Notes (Signed)
History and Physical    Robert Dixon OFB:[id number] DOB: June 07, 1941 DOA: 12/11/2021  PCP: [name], MD  Chief Complaint: SOB, cough  HPI: Robert Dixon is a 81 y.o. 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  Patient reported that last month he had an episode of aspiration pneumonia was treated with 7 days course of Augmentin.  Increasing for the last few weeks, patient developed exertional dyspnea gradually getting worse, now even walking inside his room causing severe shortness of breath.  He also developed productive cough with whitish phlegm.  Occasionally he feels " burning sensation in the chest and cannot take deep breath".  He takes PPI twice daily and the family has his had elevated while sleeping and lying in the bed.  Patient denied any choking or cough after eating or drinking and he eats regular diet.  Yesterday family noticed patient O2 saturation in the low 80s and increased his oxygen from 4 to 5 L however O2 saturation not improving overnight.  ED Course: O2 saturation was in the 70s and patient was placed on O2 supplement titrated down to 5 L.  CT angiogram negative for PE but chronic left lung loculated pleural effusion, multifocal pneumonia bilaterally.  WBC 11.6, creatinine 1.0.  Patient was started on ceftriaxone and azithromycin.  Subjective: Feeling okay   hysical Exam: Vitals:   12/12/21 0000 12/12/21 0343 12/12/21 0500 12/12/21 0815  BP: (!) 102/59 99/60 (!) 111/59 104/60  Pulse: 84 81 71 87  Resp: 17 18 17  (!) 22  Temp:  97.8 F (36.6 C)    TempSrc:  Oral    SpO2: 97% (!) 18% 100% 100%  Weight:      Height:         Constitutional: NAD, calm, comfortable Vitals:   12/12/21 0000 12/12/21 0343 12/12/21 0500 12/12/21 0815  BP: (!) 102/59 99/60 (!) 111/59 104/60  Pulse: 84 81 71 87  Resp: 17 18 17  (!) 22  Temp:  97.8 F (36.6 C)    TempSrc:  Oral    SpO2: 97% (!) 18% 100% 100%  Weight:      Height:       Eyes: PERRL, lids and conjunctivae normal ENMT: Mucous membranes are moist. Posterior pharynx clear of any exudate or lesions.Normal dentition.  Neck: normal, supple, no masses, no thyromegaly Respiratory: Diminished breathing bilaterally, d no wheezes rhonchi or rails.  Normal respiratory effort.   Cardiovascular: Regular rate and rhythm, no murmurs / rubs / gallops. No extremity edema. 2+ pedal pulses. No carotid bruits.  Abdomen: no tenderness, no masses palpated. No hepatosplenomegaly. Bowel sounds positive.  Musculoskeletal: no clubbing / cyanosis. No joint deformity upper and lower extremities. Good ROM, no contractures. Normal muscle tone.  Skin: no rashes, lesions, ulcers. No induration Neurologic: CN 2-12 grossly intact. Sensation intact, DTR normal. Strength 5/5 in all 4.  Psychiatric: Normal judgment and insight. Alert and oriented x 3. Normal mood.    Labs on Admission: I have personally reviewed following labs and imaging studies  CBC: Recent Labs  Lab 12/11/21 0832 12/12/21 0413  WBC 11.6* 7.6  NEUTROABS 10.6*  --   HGB 10.9* 9.0*  HCT 32.7* 28.9*  MCV 121.1* 125.1*  PLT 238 470   Basic Metabolic Panel: Recent Labs  Lab 12/11/21 0832 12/12/21 0413  NA 142 142  K 3.8 3.8  CL 111 111  CO2 23 24  GLUCOSE 101* 130*  BUN 15 23  CREATININE 1.03 1.20  CALCIUM 8.1* 8.3*   GFR: Estimated Creatinine Clearance: 47.5 mL/min (by C-G formula based on SCr of 1.2 mg/dL). Liver Function Tests: Recent Labs  Lab 12/11/21 0832  AST 23  ALT 18  ALKPHOS 89  BILITOT 1.1  PROT 6.1*  ALBUMIN 3.2*   No results for input(s): LIPASE, AMYLASE in the last 168 hours. No  results for input(s): AMMONIA in the last 168 hours. Coagulation Profile: No results for input(s): INR, PROTIME in the last 168 hours. Cardiac Enzymes: No results for input(s): CKTOTAL, CKMB, CKMBINDEX, TROPONINI in the last 168 hours. BNP (last 3 results) No results for input(s): PROBNP in the last 8760 hours. HbA1C: No results for input(s): HGBA1C in the last 72 hours. CBG: No results for input(s): GLUCAP in the last 168 hours. Lipid Profile: No results for input(s): CHOL, HDL, LDLCALC, TRIG, CHOLHDL, LDLDIRECT in the last 72 hours. Thyroid Function Tests: No results for input(s): TSH, T4TOTAL, FREET4, T3FREE, THYROIDAB in the last 72 hours. Anemia Panel: No results for input(s): VITAMINB12, FOLATE, FERRITIN, TIBC, IRON, RETICCTPCT in the last 72 hours. Urine analysis:    Component Value Date/Time   COLORURINE YELLOW 03/24/2020 2219   APPEARANCEUR CLEAR 03/24/2020 2219   LABSPEC 1.014 03/24/2020 2219   PHURINE 6.0 03/24/2020 2219   GLUCOSEU NEGATIVE 03/24/2020 2219   HGBUR NEGATIVE 03/24/2020 2219   [place] NEGATIVE 03/24/2020 2219   KETONESUR NEGATIVE 03/24/2020 2219   PROTEINUR NEGATIVE 03/24/2020 2219   UROBILINOGEN 1.0 04/04/2013 1331   NITRITE NEGATIVE 03/24/2020 2219   LEUKOCYTESUR NEGATIVE 03/24/2020 2219    Radiological Exams on Admission: DG Chest 2 View  Result Date: 12/11/2021 CLINICAL DATA:  Worsening dyspnea.  COPD.  Lung carcinoma. EXAM: CHEST - 2 VIEW COMPARISON:  08/10/2021 FINDINGS: Right-sided power port remains in appropriate position. Heart size is stable. Prior TAVR again noted. Moderate left pleural thickening or fluid and left lower lobe atelectasis or scarring remains stable. Small right pleural effusion or pleural thickening also unchanged. Marked pulmonary hyperinflation and diffuse interstitial prominence is unchanged and consistent with COPD. No new or worsening areas of pulmonary opacity are seen. IMPRESSION: No acute findings. Stable appearance  of moderate left pleural thickening or effusion, and left lower lobe atelectasis or scarring. COPD. Electronically Signed   By: Marlaine Hind M.D.   On: 12/11/2021 10:01   CT Angio Chest PE W and/or Wo Contrast  Result Date: 12/11/2021 CLINICAL DATA:  Dyspnea. COPD. Pulmonary embolism (PE) suspected, high prob Non-small cell left lung cancer diagnosed 2020. EXAM: CT ANGIOGRAPHY CHEST WITH CONTRAST TECHNIQUE: Multidetector CT imaging of the chest was performed using the standard protocol during bolus administration of intravenous contrast. Multiplanar CT image reconstructions and MIPs were obtained to evaluate the vascular anatomy. RADIATION DOSE REDUCTION: This exam was performed according to the departmental dose-optimization program which includes automated exposure control, adjustment of the mA and/or kV according to patient size and/or use of iterative reconstruction technique. CONTRAST:  136mL OMNIPAQUE IOHEXOL 350 MG/ML SOLN COMPARISON:  11/04/2021 chest CT. Chest radiograph from earlier today. FINDINGS: Cardiovascular: The study is high quality for the evaluation of pulmonary embolism. There are no filling defects in the central, lobar, segmental or subsegmental pulmonary artery branches to suggest acute pulmonary embolism. Aortic valve  prosthesis in place. Atherosclerotic nonaneurysmal thoracic aorta. Normal caliber pulmonary arteries. Top-normal heart size. Small to moderate pericardial effusion, similar. Left anterior descending and right coronary atherosclerosis. Mediastinum/Nodes: No discrete thyroid nodules. Unremarkable esophagus. No pathologically enlarged axillary, mediastinal or hilar lymph nodes. Lungs/Pleura: No pneumothorax. A chronic loculated small to moderate left pleural effusion with smooth mild left pleural thickening, not appreciably changed. Small dependent right pleural effusion is mildly increased from prior CT. Severe centrilobular emphysema. Extensive patchy and nodular  consolidation with air bronchograms in inferior right middle lobe and throughout dependent right lower lobe, increased from 11/04/2021 CT. Sharply marginated left lower perihilar lung consolidation with associated volume loss, bronchiectasis and distortion, compatible with radiation fibrosis, unchanged. Additional mild patchy and indistinct nodular foci of consolidation in the peripheral left upper lobe (series 6/image 50), increased from 11/04/2021, for example measuring 0.8 cm (series 6/image 50), previously 0.6 cm. New mild patchy consolidation in the posterior superior segment left lower lobe (series 6/image 69). Upper abdomen: Small hiatal hernia. Hypodense 1.0 cm left liver lesion (series 5/image 129), stable since 12/11/2018 PET-CT, considered benign. Exophytic hypodense 1.7 cm upper left renal cortical lesion (series 5/image 134), unchanged since 12/11/2018 PET-CT. Musculoskeletal: No aggressive appearing focal osseous lesions. Severe chronic T7 vertebral compression fracture. Mild thoracic spondylosis. Review of the MIP images confirms the above findings. IMPRESSION: 1. No pulmonary embolism. 2. Extensive patchy and indistinct nodular consolidation with air bronchograms in the inferior right middle lobe, dependent right lower lobe, peripheral left upper lobe and superior segment left lower lobe, new/increased from 11/04/2021 CT. While most likely progressive/recurrent multilobar pneumonia, underlying metastatic disease is difficult to exclude and close post treatment chest CT follow-up advised. 3. 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 4. Small dependent right pleural effusion is mildly increased. 5. Stable small to moderate pericardial effusion. 6. Two-vessel coronary atherosclerosis. 7. Small hiatal hernia. 8. Aortic Atherosclerosis (ICD10-I70.0) and  Emphysema (ICD10-J43.9). Electronically Signed   By: Ilona Sorrel M.D.   On: 12/11/2021 13:13    EKG: Independently reviewed.  Sinus, no acute ST changes.  Assessment/Plan Principal Problem:   PNA (pneumonia) Active Problems:   CAP (community acquired pneumonia)   Acute on chronic hypoxic respiratory failure -Secondary to bilateral bacterial pneumonia, suspect aspiration.  Clinically, significant symptoms of aspiration as per patient and his family, but the nature of multifocal bilateral pneumonia strongly indicated aspiration PNA. consult speech. -Continue ceftriaxone and azithromycin for now, check sputum culture, strep and Legionella.  Acute COPD exacerbation -Short course of IV steroid bridging for p.o. steroid -Antibiotics as above -Flutter valve -Overall improving  Chronic combined systolic and diastolic CHF -Euvolemic -Continue daily Lasix 20 mg daily  Chronic left loculated pleural effusion -Stable as reported on CT angiogram today, outpatient pulmonology follow-up.  Left lower lung non-small cell carcinoma -In remission, outpatient oncology follow-up.  GERD -Already on PPI twice daily  PVD -Recently was seen by vascular surgery and recommended outpatient echocardiogram.  Deconditioning -Caused by COPD and pneumonia, ordered PT evaluation, expect discharge to rehab.  DNR Obtain physical therapy evaluation and wean oxygen as tolerates  [name] Stirling A MD [facility] Pager [phone]  12/12/2021, 10:40 AM   Patient ID: Bettey Mare, male   DOB: Apr 03, 1941, 81 y.o.   MRN: [id number]

## 2021-12-12 NOTE — Progress Notes (Signed)
PHARMACY - PHYSICIAN COMMUNICATION CRITICAL VALUE ALERT - BLOOD CULTURE IDENTIFICATION (BCID)  Robert Dixon is an 81 y.o. male who presented to [facility] on 12/11/2021 with a chief complaint of SOB/cough  Assessment:   1/2 blood cultures growing Staphylococcus species--likely contaminant  Name of physician (or Provider) Contacted:  Dr. Cyd Silence  Current antibiotics:  Rocephin/Azithromycin  Changes to prescribed antibiotics recommended:  None at this time  Results for orders placed or performed during the hospital encounter of 12/11/21  Blood Culture ID Panel (Reflexed) (Collected: 12/11/2021  3:26 PM)  Result Value Ref Range   Enterococcus faecalis NOT DETECTED NOT DETECTED   Enterococcus Faecium NOT DETECTED NOT DETECTED   Listeria monocytogenes NOT DETECTED NOT DETECTED   Staphylococcus species DETECTED (A) NOT DETECTED   Staphylococcus aureus (BCID) NOT DETECTED NOT DETECTED   Staphylococcus epidermidis NOT DETECTED NOT DETECTED   Staphylococcus lugdunensis NOT DETECTED NOT DETECTED   Streptococcus species NOT DETECTED NOT DETECTED   Streptococcus agalactiae NOT DETECTED NOT DETECTED   Streptococcus pneumoniae NOT DETECTED NOT DETECTED   Streptococcus pyogenes NOT DETECTED NOT DETECTED   A.calcoaceticus-baumannii NOT DETECTED NOT DETECTED   Bacteroides fragilis NOT DETECTED NOT DETECTED   Enterobacterales NOT DETECTED NOT DETECTED   Enterobacter cloacae complex NOT DETECTED NOT DETECTED   Escherichia coli NOT DETECTED NOT DETECTED   Klebsiella aerogenes NOT DETECTED NOT DETECTED   Klebsiella oxytoca NOT DETECTED NOT DETECTED   Klebsiella pneumoniae NOT DETECTED NOT DETECTED   Proteus species NOT DETECTED NOT DETECTED   Salmonella species NOT DETECTED NOT DETECTED   Serratia marcescens NOT DETECTED NOT DETECTED   Haemophilus influenzae NOT DETECTED NOT DETECTED   Neisseria meningitidis NOT DETECTED NOT DETECTED   Pseudomonas aeruginosa NOT DETECTED NOT DETECTED    Stenotrophomonas maltophilia NOT DETECTED NOT DETECTED   Candida albicans NOT DETECTED NOT DETECTED   Candida auris NOT DETECTED NOT DETECTED   Candida glabrata NOT DETECTED NOT DETECTED   Candida krusei NOT DETECTED NOT DETECTED   Candida parapsilosis NOT DETECTED NOT DETECTED   Candida tropicalis NOT DETECTED NOT DETECTED   Cryptococcus neoformans/gattii NOT DETECTED NOT DETECTED    [name] 12/12/2021  11:31 PM

## 2021-12-12 NOTE — Evaluation (Signed)
Clinical/Bedside Swallow Evaluation Patient Details  Name: Robert Dixon MRN: [id number] Date of Birth: 08-02-1941  Today's Date: 12/12/2021 Time: SLP Start Time (ACUTE ONLY): 60 SLP Stop Time (ACUTE ONLY): 1101 SLP Time Calculation (min) (ACUTE ONLY): 31 min  Past Medical History:  Past Medical History:  Diagnosis Date   Acute on chronic respiratory failure with hypoxia ([place]) 03/16/2020   Acute respiratory failure ([place]) 04/09/2013   Carotid artery disease ([place]) 03/28/2020   Carotid US 02/2020:  R 100%; L 8-67%   Chronic systolic CHF 67/20/9470   Echo 02/2020: EF 30-35, small pericardial effusion, trivial MR, status post TAVR with no PVL, mean gradient 7 mmHg   Complication of anesthesia    COPD (chronic obstructive pulmonary disease) (HCC)    GERD (gastroesophageal reflux disease)    Hemoptysis 11/20/2018   History of hiatal hernia    Hypertension    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   Mediastinal adenopathy 12/06/2018   Migraine with visual aura    Non-small cell carcinoma of left lung, stage 3 ([place]) 12/19/2018   Non-small cell lung cancer ([place]) dx'd 11/20/18   Pericardial effusion 10/14/2019   Pneumonia 11/20/2018   PONV (postoperative nausea and vomiting)    Primary malignant neoplasm of left upper lobe of lung ([place]) 12/12/2018   Pulmonary nodule 11/20/2018   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   Severe aortic stenosis 10/14/2019   Skin cancer    Past Surgical History:  Past Surgical History:  Procedure Laterality Date   COLONOSCOPY W/ POLYPECTOMY     IR IMAGING GUIDED PORT INSERTION  03/14/2019   IR THORACENTESIS ASP PLEURAL SPACE W/IMG GUIDE  03/26/2020   RIGHT HEART CATH AND CORONARY ANGIOGRAPHY N/A 03/19/2020   Procedure: RIGHT HEART CATH AND CORONARY ANGIOGRAPHY;  Surgeon: [name], MD;  Location: [facility];  Service: Cardiovascular;  Laterality: N/A;   TEE WITHOUT CARDIOVERSION N/A 03/25/2020   Procedure: TRANSESOPHAGEAL  ECHOCARDIOGRAM (TEE);  Surgeon: [name], MD;  Location: [place] [facility];  Service: Open Heart Surgery;  Laterality: N/A;   TONSILLECTOMY     TRANSCATHETER AORTIC VALVE REPLACEMENT, TRANSFEMORAL Left 03/25/2020   Procedure: TRANSCATHETER AORTIC VALVE REPLACEMENT, LEFT TRANSFEMORAL;  Surgeon: [name], MD;  Location: [facility] [facility];  Service: Open Heart Surgery;  Laterality: Left;   VASECTOMY     VIDEO BRONCHOSCOPY WITH ENDOBRONCHIAL ULTRASOUND N/A 12/06/2018   Procedure: VIDEO BRONCHOSCOPY WITH ENDOBRONCHIAL ULTRASOUND;  Surgeon: Garner Nash, DO;  Location: [facility];  Service: Thoracic;  Laterality: N/A;   HPI:  81 y.o. 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     Patient reported that last month he had an episode of aspiration pneumonia was treated with 7 days course of Augmentin.  Increasing for the last few weeks, patient developed exertional dyspnea gradually getting worse, now even walking inside his room causing severe shortness of breath.  He also developed productive cough with whitish phlegm.  Occasionally he feels " burning sensation in the chest and cannot take deep breath".  He takes PPI twice daily and the family has his had elevated while sleeping and lying in the bed.  Patient denied any choking or cough after eating or drinking  and he eats regular diet.  Yesterday family noticed patient O2 saturation in the low 80s and increased his oxygen from 4 to 5 L however O2 saturation not improving overnight.    Assessment / Plan / Recommendation  Clinical Impression  Pt seen for clinical swallowing evaluation with various consistencies given  including thin via cup, puree and solid consistency without overt s/s of aspiration noted throughout BSE.  Pt was not administered a straw with liquids d/t significant respiratory hx and recent dx of PNA prompting dyspnea.  Pt stated "I have to rest after I exert myself at home" including prior to eating/drinking per report.  Daughter-in-law present to confirm report.  Pt consumes a regular/thin liquid diet at home and takes medications with liquids/whole.  Discussed with pt/family breathing/swallowing reciprocity and need to utilize swallowing precautions during intake including: slow rate and small bites/sips without straw use d/t increased volume requiring extended respiratory effort required during consumption.  Pt/family in agreement with plan.  Recommend continuing Regular/thin liquid diet with swallowing precautions in place with medication whole/puree for safety.  ST will f/u for diet tolerance/education briefly during acute stay. SLP Visit Diagnosis: Dysphagia, unspecified (R13.10)    Aspiration Risk  Mild aspiration risk    Diet Recommendation   Regular/thin liquids  Medication Administration: Whole meds with puree    Other  Recommendations Oral Care Recommendations: Oral care BID;Staff/trained caregiver to provide oral care    Recommendations for follow up therapy are one component of a multi-disciplinary discharge planning process, led by the attending physician.  Recommendations may be updated based on patient status, additional functional criteria and insurance authorization.  Follow up Recommendations Other (comment) (TBD)      Assistance Recommended at Discharge Intermittent Supervision/Assistance  Functional Status Assessment Patient has had a recent decline in their functional status and demonstrates the ability to make significant improvements in function in a reasonable and predictable amount of time.  Frequency and Duration min 1 x/week  1 week       Prognosis Prognosis  for Safe Diet Advancement: Good      Swallow Study   General Date of Onset: 12/11/21 HPI: 81 y.o. 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     Patient reported that last month he had an episode of aspiration pneumonia was treated with 7 days course of Augmentin.  Increasing for the last few weeks, patient developed exertional dyspnea gradually getting worse, now even walking inside his room causing severe shortness of breath.  He also developed productive cough with whitish phlegm.  Occasionally he feels " burning sensation in the chest and cannot take deep breath".  He takes PPI twice daily and the family has his had elevated while sleeping and lying in the bed.  Patient denied any choking or cough after eating or drinking and he eats regular diet.  Yesterday family noticed patient O2 saturation in the low 80s and increased his oxygen from 4 to 5 L however O2 saturation not improving overnight. Type of Study: Bedside Swallow Evaluation Previous Swallow Assessment: none Diet Prior to this Study: Thin liquids;Regular Temperature Spikes Noted: No Respiratory Status: Nasal cannula (HFNC; 4L) History of Recent Intubation: No Behavior/Cognition: Alert;Cooperative Oral Cavity Assessment: Within  Functional Limits Oral Care Completed by SLP: No Oral Cavity - Dentition: Adequate natural dentition Vision: Functional for self-feeding Self-Feeding Abilities: Able to feed self Patient Positioning: Other (comment) (Upright on EOB) Baseline Vocal Quality: Low vocal intensity Volitional Cough: Strong Volitional Swallow: Able to elicit    Oral/Motor/Sensory Function Overall Oral  Motor/Sensory Function: Within functional limits   Ice Chips Ice chips: Within functional limits Presentation: Spoon   Thin Liquid Thin Liquid: Within functional limits Presentation: Self Fed;Cup Other Comments: did not attempt straw d/t dyspnea    Nectar Thick Nectar Thick Liquid: Not tested   Honey Thick Honey Thick Liquid: Not tested   Puree Puree: Within functional limits Presentation: Self Fed   Solid     Solid: Within functional limits Presentation: Self Fed      Elvina Sidle, M.S., CCC-SLP 12/12/2021,1:58 PM

## 2021-12-12 NOTE — ED Notes (Signed)
Breakfast orders placed 

## 2021-12-13 ENCOUNTER — Other Ambulatory Visit: Payer: Self-pay

## 2021-12-13 ENCOUNTER — Encounter (HOSPITAL_COMMUNITY): Payer: Self-pay | Admitting: Internal Medicine

## 2021-12-13 LAB — CBC WITH DIFFERENTIAL/PLATELET
Abs Immature Granulocytes: 0.03 10*3/uL (ref 0.00–0.07)
Basophils Absolute: 0.1 10*3/uL (ref 0.0–0.1)
Basophils Relative: 1 %
Eosinophils Absolute: 0.1 10*3/uL (ref 0.0–0.5)
Eosinophils Relative: 2 %
HCT: 28.1 % — ABNORMAL LOW (ref 39.0–52.0)
Hemoglobin: 8.8 g/dL — ABNORMAL LOW (ref 13.0–17.0)
Immature Granulocytes: 1 %
Lymphocytes Relative: 13 %
Lymphs Abs: 0.7 10*3/uL (ref 0.7–4.0)
MCH: 39.3 pg — ABNORMAL HIGH (ref 26.0–34.0)
MCHC: 31.3 g/dL (ref 30.0–36.0)
MCV: 125.4 fL — ABNORMAL HIGH (ref 80.0–100.0)
Monocytes Absolute: 0.5 10*3/uL (ref 0.1–1.0)
Monocytes Relative: 9 %
Neutro Abs: 4.2 10*3/uL (ref 1.7–7.7)
Neutrophils Relative %: 74 %
Platelets: 199 10*3/uL (ref 150–400)
RBC: 2.24 MIL/uL — ABNORMAL LOW (ref 4.22–5.81)
RDW: 14.7 % (ref 11.5–15.5)
WBC: 5.7 10*3/uL (ref 4.0–10.5)
nRBC: 0 % (ref 0.0–0.2)

## 2021-12-13 LAB — BASIC METABOLIC PANEL
Anion gap: 11 (ref 5–15)
BUN: 22 mg/dL (ref 8–23)
CO2: 23 mmol/L (ref 22–32)
Calcium: 8.2 mg/dL — ABNORMAL LOW (ref 8.9–10.3)
Chloride: 108 mmol/L (ref 98–111)
Creatinine, Ser: 1.14 mg/dL (ref 0.61–1.24)
GFR, Estimated: >60 mL/min (ref >60)
Glucose, Bld: 98 mg/dL (ref 70–99)
Potassium: 3.9 mmol/L (ref 3.5–5.1)
Sodium: 142 mmol/L (ref 135–145)

## 2021-12-13 MED ORDER — ENSURE ENLIVE PO LIQD
237.0000 mL | Freq: Two times a day (BID) | ORAL | Status: DC
  Administered 2021-12-13 – 2021-12-17 (×7): 237 mL via ORAL

## 2021-12-13 MED ORDER — ALBUTEROL SULFATE (2.5 MG/3ML) 0.083% IN NEBU: 2.5000 mg | INHALATION_SOLUTION | Freq: Four times a day (QID) | RESPIRATORY_TRACT | Status: DC | PRN

## 2021-12-13 NOTE — Progress Notes (Signed)
Initial Nutrition Assessment  DOCUMENTATION CODES:  Not applicable  INTERVENTION:  Add Ensure Plus High Protein po BID, each supplement provides 350 kcal and 20 grams of protein.   Continue MVI with minerals daily.  Obtain updated measured weight.  Encourage PO and supplement intake.  NUTRITION DIAGNOSIS:  Increased nutrient needs related to acute illness (PNA) as evidenced by estimated needs.  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 Admitted with PNA.  RD working remotely. Attempted to call patient's room phone. Pt did not answer. All information obtained via chart review.  Per Epic, pt ate 100% of breakfast this morning.   Pt's weight has remained stable, per Epic, over the past year.  Weight appears to be stated. RD to order new measured weight to determine any changes.  Suspect pt may be malnourished to some degree, but RD cannot definitively diagnose at this time.  Of note, no edema present per RN documentation.  Medications: reviewed; Lasix, MVI with minerals, Protonix BID, FiberCon BID, IV ABX  Labs: reviewed  NUTRITION - FOCUSED PHYSICAL EXAM: Unable to perform - defer to follow-up  Diet Order:   Diet Order             Diet Heart Room service appropriate? Yes; Fluid consistency: Thin; Fluid restriction: 2000 mL Fluid  Diet effective now                  EDUCATION NEEDS:  No education needs have been identified at this time  Skin:  Skin  Assessment: Reviewed RN Assessment  Last BM:  12/13/21  Height:  Ht Readings from Last 1 Encounters:  12/11/21 5\' 8"  (1.727 m)   Weight:  Wt Readings from Last 1 Encounters:  12/11/21 72.1 kg   BMI:  Body mass index is 24.18 kg/m.  Estimated Nutritional Needs:  Kcal:  1900-2100 Protein:  95-110 grams Fluid:  >1.9 L  Derrel Nip, RD, LDN (she/her/hers) Clinical Inpatient Dietitian RD Pager/After-Hours/Weekend Pager # in [place]

## 2021-12-13 NOTE — Care Management Important Message (Signed)
Important Message  Patient Details  Name: Robert Dixon MRN: [id number] Date of Birth: 02/16/1941   Medicare Important Message Given:  Yes     Arthur Speagle Montine Circle 12/13/2021, 1:07 PM

## 2021-12-13 NOTE — Progress Notes (Signed)
History and Physical    Robert Dixon MOQ:[id number] DOB: Nov 07, 1941 DOA: 12/11/2021  PCP: [name], MD  Chief Complaint: SOB, cough  HPI: Robert Dixon is a 81 y.o. 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  Patient reported that last month he had an episode of aspiration pneumonia was treated with 7 days course of Augmentin.  Increasing for the last few weeks, patient developed exertional dyspnea gradually getting worse, now even walking inside his room causing severe shortness of breath.  He also developed productive cough with whitish phlegm.  Occasionally he feels " burning sensation in the chest and cannot take deep breath".  He takes PPI twice daily and the family has his had elevated while sleeping and lying in the bed.  Patient denied any choking or cough after eating or drinking and he eats regular diet.  Yesterday family noticed patient O2 saturation in the low 80s and increased his oxygen from 4 to 5 L however O2 saturation not improving overnight.  ED Course: O2 saturation was in the 70s and patient was placed on O2 supplement titrated down to 5 L.  CT angiogram negative for PE but chronic left lung loculated pleural effusion, multifocal pneumonia bilaterally.  WBC 11.6, creatinine 1.0.  Patient was started on ceftriaxone and azithromycin.  Subjective: Feeling okay today but some better   hysical Exam: Vitals:   12/13/21 0516 12/13/21 0755 12/13/21 0758 12/13/21 1011  BP: 125/60   109/67  Pulse: 88   88  Resp: 18   18  Temp: 98.3 F (36.8 C)   98.4 F (36.9 C)  TempSrc: Oral     SpO2: 90% 90% 90% 97%  Weight:      Height:         Constitutional: NAD, calm, comfortable Vitals:   12/13/21 0516 12/13/21 0755 12/13/21 0758 12/13/21 1011  BP: 125/60   109/67  Pulse: 88   88  Resp: 18   18  Temp: 98.3 F (36.8 C)   98.4 F (36.9 C)  TempSrc: Oral     SpO2: 90% 90% 90% 97%  Weight:      Height:       Eyes: PERRL, lids and conjunctivae normal ENMT: Mucous membranes are moist. Posterior pharynx clear of any exudate or lesions.Normal dentition.  Neck: normal, supple, no masses, no thyromegaly Respiratory: Clear to auscultation bilaterally no wheezes rhonchi or rales normal respiratory effort no use of assessor muscles  cardiovascular: Regular rate and rhythm, no murmurs / rubs / gallops. No extremity edema. 2+ pedal pulses. No carotid bruits.  Abdomen: no tenderness, no masses palpated. No hepatosplenomegaly. Bowel sounds positive.  Musculoskeletal: no clubbing / cyanosis. No joint deformity upper and lower extremities. Good ROM, no contractures. Normal muscle tone.  Skin: no rashes, lesions, ulcers. No induration Neurologic: CN 2-12 grossly intact. Sensation intact, DTR normal. Strength 5/5 in all 4.  Psychiatric: Normal judgment and insight. Alert and oriented x 3. Normal mood.    Labs on Admission: I have personally reviewed following labs and imaging studies  CBC: Recent Labs  Lab 12/11/21 0832 12/12/21 0413 12/13/21 0135  WBC 11.6* 7.6 5.7  NEUTROABS 10.6*  --  4.2  HGB 10.9* 9.0*  8.8*  HCT 32.7* 28.9* 28.1*  MCV 121.1* 125.1* 125.4*  PLT 238 199 536   Basic Metabolic Panel: Recent Labs  Lab 12/11/21 0832 12/12/21 0413 12/13/21 0135  NA 142 142 142  K 3.8 3.8 3.9  CL 111 111 108  CO2 23 24 23   GLUCOSE 101* 130* 98  BUN 15 23 22   CREATININE 1.03 1.20 1.14  CALCIUM 8.1* 8.3* 8.2*   GFR: Estimated Creatinine Clearance: 50 mL/min (by C-G formula based on SCr of 1.14 mg/dL). Liver Function Tests: Recent Labs  Lab 12/11/21 0832  AST 23  ALT 18  ALKPHOS 89  BILITOT 1.1  PROT 6.1*   ALBUMIN 3.2*   No results for input(s): LIPASE, AMYLASE in the last 168 hours. No results for input(s): AMMONIA in the last 168 hours. Coagulation Profile: No results for input(s): INR, PROTIME in the last 168 hours. Cardiac Enzymes: No results for input(s): CKTOTAL, CKMB, CKMBINDEX, TROPONINI in the last 168 hours. BNP (last 3 results) No results for input(s): PROBNP in the last 8760 hours. HbA1C: No results for input(s): HGBA1C in the last 72 hours. CBG: No results for input(s): GLUCAP in the last 168 hours. Lipid Profile: No results for input(s): CHOL, HDL, LDLCALC, TRIG, CHOLHDL, LDLDIRECT in the last 72 hours. Thyroid Function Tests: No results for input(s): TSH, T4TOTAL, FREET4, T3FREE, THYROIDAB in the last 72 hours. Anemia Panel: No results for input(s): VITAMINB12, FOLATE, FERRITIN, TIBC, IRON, RETICCTPCT in the last 72 hours. Urine analysis:    Component Value Date/Time   COLORURINE YELLOW 03/24/2020 2219   APPEARANCEUR CLEAR 03/24/2020 2219   LABSPEC 1.014 03/24/2020 2219   PHURINE 6.0 03/24/2020 2219   GLUCOSEU NEGATIVE 03/24/2020 2219   HGBUR NEGATIVE 03/24/2020 2219   BILIRUBINUR NEGATIVE 03/24/2020 2219   KETONESUR NEGATIVE 03/24/2020 2219   PROTEINUR NEGATIVE 03/24/2020 2219   UROBILINOGEN 1.0 04/04/2013 1331   NITRITE NEGATIVE 03/24/2020 2219   LEUKOCYTESUR NEGATIVE 03/24/2020 2219    Radiological Exams on Admission: CT Angio Chest PE W and/or Wo Contrast  Result Date: 12/11/2021 CLINICAL DATA:  Dyspnea. COPD. Pulmonary embolism (PE) suspected, high prob Non-small cell left lung cancer diagnosed 2020. EXAM: CT ANGIOGRAPHY CHEST WITH CONTRAST TECHNIQUE: Multidetector CT imaging of the chest was performed using the standard protocol during bolus administration of intravenous contrast. Multiplanar CT image reconstructions and MIPs were obtained to evaluate the vascular anatomy. RADIATION DOSE REDUCTION: This exam was performed according to the departmental  dose-optimization program which includes automated exposure control, adjustment of the mA and/or kV according to patient size and/or use of iterative reconstruction technique. CONTRAST:  114mL OMNIPAQUE IOHEXOL 350 MG/ML SOLN COMPARISON:  11/04/2021 chest CT. Chest radiograph from earlier today. FINDINGS: Cardiovascular: The study is high quality for the evaluation of pulmonary embolism. There are no filling defects in the central, lobar, segmental or subsegmental pulmonary artery branches to suggest acute pulmonary embolism. 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 Atherosclerotic nonaneurysmal thoracic aorta. Normal caliber pulmonary arteries. Top-normal heart size. Small to moderate pericardial effusion, similar. Left anterior descending and right coronary atherosclerosis. Mediastinum/Nodes: No discrete thyroid nodules. Unremarkable esophagus. No pathologically enlarged axillary, mediastinal or hilar lymph nodes. Lungs/Pleura: No pneumothorax. A chronic loculated small to moderate left pleural effusion with smooth mild left pleural thickening, not appreciably changed. Small dependent right pleural effusion is mildly increased from prior CT. Severe centrilobular emphysema. Extensive patchy and nodular consolidation with air bronchograms in inferior right middle lobe and throughout dependent right lower lobe, increased from 11/04/2021 CT. Sharply marginated  left lower perihilar lung consolidation with associated volume loss, bronchiectasis and distortion, compatible with radiation fibrosis, unchanged. Additional mild patchy and indistinct nodular foci of consolidation in the peripheral left upper lobe (series 6/image 50), increased from 11/04/2021, for example measuring 0.8 cm (series 6/image 50), previously 0.6 cm. New mild patchy consolidation in the posterior superior segment left lower lobe (series 6/image 69). Upper abdomen: Small hiatal hernia. Hypodense 1.0 cm left liver lesion (series 5/image 129), stable since  12/11/2018 PET-CT, considered benign. Exophytic hypodense 1.7 cm upper left renal cortical lesion (series 5/image 134), unchanged since 12/11/2018 PET-CT. Musculoskeletal: No aggressive appearing focal osseous lesions. Severe chronic T7 vertebral compression fracture. Mild thoracic spondylosis. Review of the MIP images confirms the above findings. IMPRESSION: 1. No pulmonary embolism. 2. Extensive patchy and indistinct nodular consolidation with air bronchograms in the inferior right middle lobe, dependent right lower lobe, peripheral left upper lobe and superior segment left lower lobe, new/increased from 11/04/2021 CT. While most likely progressive/recurrent multilobar pneumonia, underlying metastatic disease is difficult to exclude and close post treatment chest CT follow-up advised. 3. 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 4. Small dependent right pleural effusion is mildly increased. 5. Stable small to moderate pericardial effusion. 6. Two-vessel coronary atherosclerosis. 7. Small hiatal hernia. 8. Aortic Atherosclerosis (ICD10-I70.0) and Emphysema (ICD10-J43.9). Electronically Signed   By: Ilona Sorrel M.D.   On: 12/11/2021 13:13    EKG: Independently reviewed.  Sinus, no acute ST changes.  Assessment/Plan Principal Problem:   PNA (pneumonia) Active Problems:   CAP (community acquired pneumonia)   Acute on chronic hypoxic respiratory failure -Secondary to bilateral bacterial pneumonia, suspect aspiration.  Clinically, significant symptoms of aspiration as per patient and his family, but the nature of multifocal bilateral pneumonia strongly indicated aspiration PNA. consult speech. -Continue ceftriaxone and azithromycin for now, check sputum culture, strep and Legionella. -Overall improving  Acute COPD exacerbation -Short course of IV steroid  bridging for p.o. steroid -Antibiotics as above -Flutter valve -Overall improving  Chronic combined systolic and diastolic CHF -Euvolemic -Continue daily Lasix 20 mg daily  Chronic left loculated pleural effusion -Stable as reported on CT angiogram today, outpatient pulmonology follow-up.  Left lower lung non-small cell carcinoma -In remission, outpatient oncology follow-up.  GERD -Already on PPI twice daily  PVD -Recently was seen by vascular surgery and recommended outpatient echocardiogram.  Deconditioning -Caused by COPD and pneumonia, ordered PT evaluation, expect discharge to rehab.  DNR Obtain physical therapy evaluation and wean oxygen as tolerates down to his chronic 4 L nasal cannula.  Currently on 7 L high flow nasal cannula.  Phillips Grout MD [facility] Pager [phone]  12/13/2021, 10:17 AM   Patient ID: Bettey Mare, male   DOB: 07/08/1941, 81 y.o.   MRN: [id number]

## 2021-12-13 NOTE — Progress Notes (Signed)
New Admission Note:   Arrival Method:  Stretcher Mental Orientation: ax4 Telemetry: none Assessment: Completed Skin: see flow sheet IV:NSL x2 Pain: none Tubes: none Safety Measures: Safety Fall Prevention Plan has been discussed Admission: Completed [facility] Orientation: Patient has been orientated to the room, unit and staff.  Family: none at bedside  Orders have been reviewed and implemented. Will continue to monitor the patient. Call light has been placed within reach and bed alarm has been activated.   Rockie Neighbours BSN, RN Phone number: [phone]

## 2021-12-13 NOTE — Evaluation (Signed)
Physical Therapy Evaluation Patient Details Name: Robert Dixon MRN: [id number] DOB: 09-Aug-1941 Today's Date: 12/13/2021  History of Present Illness  81 y.o. male presented 12/11/21 with increasing shortness of breath and cough. 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 PTA patient was ambulating inside home without a device, with sats dropping as low as 60s on 4L of home O2, and requiring several minutes of seated rest with any activity.  Pt currently ambulates 20 ft with minguard assist for lines and safety with sats dropping to 71% on 6L with seated recovery x 4 minutes to 91%.  Lengthy discussion re: pt's overall decline in function vs his recent acute decline. Patient agreed to HHPT for additional strengthening and home safety evaluation. Anticipate patient will benefit from PT to address problems listed below.Will continue to follow acutely to maximize functional mobility independence and safety.          Recommendations for follow up therapy are one component of a multi-disciplinary discharge planning process, led by the attending physician.  Recommendations may be updated based on patient status, additional functional criteria and insurance authorization.  Follow Up Recommendations Home health PT    Assistance Recommended at Discharge Intermittent Supervision/Assistance  Patient can return home with the following  Help with stairs or ramp for entrance    Equipment Recommendations None recommended by PT  Recommendations for Other Services  OT consult    Functional Status Assessment Patient has had a  recent decline in their functional status and demonstrates the ability to make significant improvements in function in a reasonable and predictable amount of time.     Precautions / Restrictions        Mobility  Bed Mobility Overal bed mobility: Modified Independent             General bed mobility comments: inct time due to dyspnea    Transfers Overall transfer level: Needs assistance Equipment used: None Transfers: Sit to/from Stand Sit to Stand: Min guard           General transfer comment: for safety    Ambulation/Gait Ambulation/Gait assistance: Min guard Gait Distance (Feet): 20 Feet Assistive device: None Gait Pattern/deviations: Step-through pattern, Decreased stride length Gait velocity: appropriately decr     General Gait Details: pt able to self-select distance he could tolerate; no imbalance; very dyspneic with sats down to 71% on 6L  Stairs            Wheelchair Mobility    Modified Rankin (Stroke Patients Only)       Balance                                             Pertinent Vitals/Pain Pain Assessment Pain Assessment: No/denies pain    Home Living Family/patient expects to be discharged to:: Private residence Living Arrangements: Alone Available Help at Discharge: Family;Available 24 hours/day Type of Home: House Home Access: Stairs to enter Entrance Stairs-Rails: Right Entrance Stairs-Number of Steps: 2   Home Layout: One level Home Equipment: Cane - single point;Tub  bench;Rolling Walker (2 wheels);Wheelchair - Education administrator (comment) (electric bed)      Prior Function Prior Level of Function : Needs assist             Mobility Comments: walks independently from room to room and then needs to recover; always someone with him if he goes up/down steps to enter/exit ADLs Comments: too fatiguing to get in shower; does sink bath on his own; family does grocery shopping, cleaning, IADLs     Hand  Dominance        Extremity/Trunk Assessment   Upper Extremity Assessment Upper Extremity Assessment: Overall WFL for tasks assessed    Lower Extremity Assessment Lower Extremity Assessment: Overall WFL for tasks assessed    Cervical / Trunk Assessment Cervical / Trunk Assessment: Kyphotic  Communication   Communication: No difficulties  Cognition Arousal/Alertness: Awake/alert Behavior During Therapy: WFL for tasks assessed/performed Overall Cognitive Status: Within Functional Limits for tasks assessed                                          General Comments General comments (skin integrity, edema, etc.): on 6L with sats 91% at rest; dropped to 73% to sit EOB with 5.5 minutes to recover to 91%; during ambulation dropped to 71% with 66min to recover (seated rest); Discussed with daughter in law and patient that if they make things "easier" for pt by moving everything into one room for him (bed, TV, BSC) he will inevitably lose function. Both expressed understanding    Exercises Other Exercises Other Exercises: educated on pursed lip breathing; pt able to return demonstrate with max cues and demonstration Other Exercises: discussed walking in room with nursing   Assessment/Plan    PT Assessment Patient needs continued PT services  PT Problem List Decreased strength;Decreased activity tolerance;Decreased mobility;Decreased knowledge of use of DME;Cardiopulmonary status limiting activity       PT Treatment Interventions DME instruction;Gait training;Stair training;Functional mobility training;Therapeutic activities;Therapeutic exercise;Patient/family education    PT Goals (Current goals can be found in the Care Plan section)  Acute Rehab PT Goals Patient Stated Goal: feel better and able to walk inside his home PT Goal Formulation: With patient Time For Goal Achievement: 12/27/21 Potential to Achieve Goals: Good    Frequency Min 3X/week      Co-evaluation               AM-PAC PT "6 Clicks" Mobility  Outcome Measure Help needed turning from your back to your side while in a flat bed without using bedrails?: None Help needed moving from lying on your back to sitting on the side of a flat bed without using bedrails?: None Help needed moving to and from a bed to a chair (including a wheelchair)?: A Little Help needed standing up from a chair using your arms (e.g., wheelchair or bedside chair)?: A Little Help needed to walk in hospital room?: A Little Help needed climbing 3-5 steps with a railing? : A Little 6 Click Score: 20    End of Session Equipment Utilized During Treatment: Gait belt;Oxygen Activity Tolerance: Treatment limited secondary to medical complications (Comment) (dyspnea 4/4) Patient left: in bed;with call bell/phone within reach;with family/visitor present Nurse Communication: Mobility status;Other (comment) (would be good to get some extra walking due to lives alone) PT Visit Diagnosis: Other abnormalities of gait and mobility (R26.89)    Time: 7672-0947 PT Time Calculation (min) (  ACUTE ONLY): 32 min   Charges:   PT Evaluation $PT Eval Moderate Complexity: 1 Mod PT Treatments $Therapeutic Activity: 8-22 mins         Arby Barrette, PT Acute Rehabilitation Services  Pager [phone] Office [phone]   Rexanne Mano 12/13/2021, 2:14 PM

## 2021-12-14 DIAGNOSIS — J9621 Acute and chronic respiratory failure with hypoxia: Secondary | ICD-10-CM

## 2021-12-14 LAB — CULTURE, BLOOD (ROUTINE X 2): Special Requests: ADEQUATE

## 2021-12-14 LAB — BASIC METABOLIC PANEL
Anion gap: 8 (ref 5–15)
BUN: 18 mg/dL (ref 8–23)
CO2: 26 mmol/L (ref 22–32)
Calcium: 8.1 mg/dL — ABNORMAL LOW (ref 8.9–10.3)
Chloride: 107 mmol/L (ref 98–111)
Creatinine, Ser: 0.9 mg/dL (ref 0.61–1.24)
GFR, Estimated: >60 mL/min (ref >60)
Glucose, Bld: 87 mg/dL (ref 70–99)
Potassium: 3.6 mmol/L (ref 3.5–5.1)
Sodium: 141 mmol/L (ref 135–145)

## 2021-12-14 LAB — CBC WITH DIFFERENTIAL/PLATELET
Abs Immature Granulocytes: 0.07 10*3/uL (ref 0.00–0.07)
Basophils Absolute: 0.1 10*3/uL (ref 0.0–0.1)
Basophils Relative: 2 %
Eosinophils Absolute: 0.2 10*3/uL (ref 0.0–0.5)
Eosinophils Relative: 3 %
HCT: 27.9 % — ABNORMAL LOW (ref 39.0–52.0)
Hemoglobin: 9.2 g/dL — ABNORMAL LOW (ref 13.0–17.0)
Immature Granulocytes: 1 %
Lymphocytes Relative: 12 %
Lymphs Abs: 0.6 10*3/uL — ABNORMAL LOW (ref 0.7–4.0)
MCH: 39.8 pg — ABNORMAL HIGH (ref 26.0–34.0)
MCHC: 33 g/dL (ref 30.0–36.0)
MCV: 120.8 fL — ABNORMAL HIGH (ref 80.0–100.0)
Monocytes Absolute: 0.5 10*3/uL (ref 0.1–1.0)
Monocytes Relative: 9 %
Neutro Abs: 4 10*3/uL (ref 1.7–7.7)
Neutrophils Relative %: 73 %
Platelets: 213 10*3/uL (ref 150–400)
RBC: 2.31 MIL/uL — ABNORMAL LOW (ref 4.22–5.81)
RDW: 14.7 % (ref 11.5–15.5)
WBC: 5.5 10*3/uL (ref 4.0–10.5)
nRBC: 0 % (ref 0.0–0.2)

## 2021-12-14 MED ORDER — ALBUTEROL SULFATE (2.5 MG/3ML) 0.083% IN NEBU
2.5000 mg | INHALATION_SOLUTION | RESPIRATORY_TRACT | Status: DC | PRN
  Administered 2021-12-15 – 2021-12-17 (×7): 2.5 mg via RESPIRATORY_TRACT
  Filled 2021-12-14 (×8): qty 3

## 2021-12-14 MED ORDER — ALBUTEROL SULFATE (2.5 MG/3ML) 0.083% IN NEBU
2.5000 mg | INHALATION_SOLUTION | Freq: Three times a day (TID) | RESPIRATORY_TRACT | Status: DC
  Administered 2021-12-14: 2.5 mg via RESPIRATORY_TRACT
  Filled 2021-12-14: qty 3

## 2021-12-14 NOTE — Progress Notes (Signed)
Speech Language Pathology Treatment: Dysphagia  Patient Details Name: Robert Dixon MRN: [id number] DOB: 07/26/1941 Today's Date: 12/14/2021 Time: 3709-6438 SLP Time Calculation (min) (ACUTE ONLY): 15 min  Assessment / Plan / Recommendation Clinical Impression  Patient seen by SLP to address swallow function goals. Session was focused on education regarding swallow safety precations and ensuring patient fully understood. SLP reviewed and educated patient on pacing self when eating/drinking, eating small meals more frequently, taking rest breaks. He mentioned how evaluating SLP told him to take only small sips and SLP discussed this with him, recommending that he take single sips but if he is sitting up well and not getting short of breath, he can take normal size sips but not to drink consecutive sips. Patient was going to consume some of his water but when he was repositioning himself in the bed, just this amount of exertion caused him to become SOB and he felt that he needed to hold off on drinking anything. He reported to SLP "just about anything I do" makes him short of breath. SLP recommending at least one more f/u, preferrably with a meal, to ensure he is tolerating diet with learned precautions.    HPI HPI: 81 y.o. 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     Patient reported that last month he had an episode of aspiration pneumonia was treated with 7 days course of Augmentin.  Increasing for the last few weeks, patient developed exertional dyspnea gradually getting worse, now even walking inside his room causing severe shortness of breath.  He also developed productive  cough with whitish phlegm.  Occasionally he feels " burning sensation in the chest and cannot take deep breath".  He takes PPI twice daily and the family has his had elevated while sleeping and lying in the bed.  Patient denied any choking or cough after eating or drinking and he eats regular diet.  Yesterday family noticed patient O2 saturation in the low 80s and increased his oxygen from 4 to 5 L however O2 saturation not improving overnight.      SLP Plan  Continue with current plan of care      Recommendations for follow up therapy are one component of a multi-disciplinary discharge planning process, led by the attending physician.  Recommendations may be updated based on patient status, additional functional criteria and insurance authorization.    Recommendations  Diet recommendations: Regular;Thin liquid Liquids provided via: Cup;No straw Medication Administration: Whole meds with puree Supervision: Patient able to self feed Compensations: Small sips/bites;Slow rate Postural Changes and/or Swallow Maneuvers: Seated upright 90 degrees                Oral Care Recommendations: Oral care BID Follow Up Recommendations: Other (comment) (TBD) Assistance recommended at discharge: Set up Supervision/Assistance SLP Visit Diagnosis: Dysphagia, unspecified (R13.10) Plan: Continue with current plan of care          Sonia Baller, MA, CCC-SLP Speech Therapy

## 2021-12-14 NOTE — Progress Notes (Signed)
Physical Therapy Treatment Patient Details Name: Robert Dixon MRN: [id number] DOB: February 22, 1941 Today's Date: 12/14/2021   History of Present Illness 81 y.o. male presented 12/11/21 with increasing shortness of breath and cough. 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 Even with minimal mobility (getting to edge of bed or donning slippers) the patient desaturates into the 70s. PT provides significant reinforcement of pursed lip breathing technique and provides education on necessity for use of incentive spirometer and acapella respiratory device to improve respiratory muscle recruitment.   Recommendations for follow up therapy are one component of a multi-disciplinary discharge planning process, led by the attending physician.  Recommendations may be updated based on patient status, additional functional criteria and insurance authorization.  Follow Up Recommendations  Home health PT     Assistance Recommended at Discharge Intermittent Supervision/Assistance  Patient can return home with the following Help with stairs or ramp for entrance   Equipment Recommendations  None recommended by PT    Recommendations for Other Services       Precautions / Restrictions Precautions Precautions: Fall;Other (comment) Precaution Comments: monitor SpO2 Restrictions Weight Bearing Restrictions: No     Mobility  Bed Mobility Overal bed mobility: Modified Independent             General bed mobility comments: HOB elevated    Transfers Overall transfer level: Needs assistance Equipment used: None Transfers: Sit  to/from Stand Sit to Stand: Supervision                Ambulation/Gait Ambulation/Gait assistance: Supervision Gait Distance (Feet): 20 Feet (additional trial of 15') Assistive device: None Gait Pattern/deviations: Step-through pattern Gait velocity: reduced Gait velocity interpretation: <1.8 ft/sec, indicate of risk for recurrent falls   General Gait Details: pt with slowed step-through gait, reduced stride length   Stairs             Wheelchair Mobility    Modified Rankin (Stroke Patients Only)       Balance Overall balance assessment: Needs assistance Sitting-balance support: No upper extremity supported, Feet supported Sitting balance-Leahy Scale: Good     Standing balance support: No upper extremity supported, During functional activity Standing balance-Leahy Scale: Fair                              Cognition Arousal/Alertness: Awake/alert Behavior During Therapy: WFL for tasks assessed/performed Overall Cognitive Status: Within Functional Limits for tasks assessed                                          Exercises      General Comments General comments (skin integrity, edema, etc.): pt on 4L [place] upon PT arrival, saturating in low 80s. PT increases supplemental oxygen to 6L [place] with improvement in sats up from 81-87% at rest. Pt desats to 73% with activity on 6L . Recovers to high 80s with >5 minute recovery time. PT provides encouragement for pursed lip breathin exercise, encourages family to bring in incentive spirometer  and Acapella device to initiate respiratory exercise program.      Pertinent Vitals/Pain Pain Assessment Pain Assessment: No/denies pain    Home Living                          Prior Function            PT Goals (current goals can now be found in the care plan section) Acute Rehab PT Goals Patient Stated Goal: feel better and able to walk inside his home Progress towards PT goals:  Not progressing toward goals - comment (remains at same functional level)    Frequency    Min 3X/week      PT Plan Current plan remains appropriate    Co-evaluation              AM-PAC PT "6 Clicks" Mobility   Outcome Measure  Help needed turning from your back to your side while in a flat bed without using bedrails?: None Help needed moving from lying on your back to sitting on the side of a flat bed without using bedrails?: None Help needed moving to and from a bed to a chair (including a wheelchair)?: A Little Help needed standing up from a chair using your arms (e.g., wheelchair or bedside chair)?: A Little Help needed to walk in hospital room?: A Little Help needed climbing 3-5 steps with a railing? : A Little 6 Click Score: 20    End of Session Equipment Utilized During Treatment: Oxygen Activity Tolerance: Patient limited by fatigue Patient left: in bed;with call bell/phone within reach;with family/visitor present Nurse Communication: Mobility status PT Visit Diagnosis: Other abnormalities of gait and mobility (R26.89)     Time: 9381-8299 PT Time Calculation (min) (ACUTE ONLY): 48 min  Charges:  $Gait Training: 8-22 mins $Therapeutic Exercise: 8-22 mins $Therapeutic Activity: 8-22 mins                     Zenaida Niece, PT, DPT Acute Rehabilitation Pager: [phone] Office [phone]    Zenaida Niece 12/14/2021, 12:20 PM

## 2021-12-14 NOTE — Consult Note (Signed)
NAME:  JESSEE MEZERA, MRN:  [id number], DOB:  1941-09-24, LOS: 3 ADMISSION DATE:  12/11/2021, CONSULTATION DATE:  12/14/2021 REFERRING MD:  Dr. 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   He is followed in our office by [name], last seen 09/22/2021.  He is currently in remission followed by Dr. Julien Nordmann.  At baseline, he has exertional shortness of breath and cough.  He was treated for presumed aspiration pneumonia with 7 day course of augmentin on 12/16 after having feeling fatigued and increased cough after routine f/u CT showed new infiltrates in RLL concerning for pneumonia.  Reports feeling back to his baseline afterwards and never had to increase his home O2.    On 1/15, he presented to the ER after having worsening exertional worsening shortness of breath the night prior and could not get his O2 saturations above 80% on his home O2 4L.  He denies any recent URIs, choking episodes with food, fever, chills, dizziness/ syncope, leg swelling, or change in cough.  He has been afebrile and noted to have WBC 11.6 on admit.  Underwent CTA which was negative for PE, but showed progression of patchy infiltrates in RML, RLL, LUL, and LLL progressed since CT in December.  He was admitted to [facility] and started on azithromycin and ceftriaxone.  Speech evaluation showed mild aspiration risk.  He continues to have higher O2 requirement above baseline, therefore pulmonary consulted for further recs.    Pertinent  Medical History  COPD/  significant centrilobular emphysema, chronic hypoxic respiratory failure on 4L [place] home O2, restrictive lung disease 2/2 to radiation pulmonary fibrosis, non-small cell lung cancer s/p radiation and immunotherapy, chronic left loculated effusion, systolic and diastolic HF, GERD, PVD, AS s/p TAVR  Significant Hospital Events: Including procedures, antibiotic start and stop dates in addition to other pertinent events   1/15 admitted to [facility] for CAP, azithro/ ctx   Interim History / Subjective:  Overall, he feels better since admission.    Objective   Blood pressure (!) 144/65, pulse 93, temperature 97.7 F (36.5 C), temperature source Oral, resp. rate 18, height 5\' 8"  (1.727 m), weight 72.1 kg, SpO2 90 %.        Intake/Output Summary (Last 24 hours) at 12/14/2021 1503 Last data filed at 12/14/2021 1300 Gross per 24 hour  Intake 1020 ml  Output 900 ml  Net 120 ml   Filed Weights   12/11/21 0824  Weight: 72.1 kg   Examination: General:  Pleasant elderly male sitting upright in bed in NAD HEENT: MM pink/moist Neuro: Alert, oriented, MAE CV: rr  PULM:  no distress at rest, dry cough, clear anteriorly, diminished left base, no wheeze, mildly increased WOB with any exertion, currently on 5L [place] GI: soft, bs+ Extremities: warm/dry, no LE edema, some muscle wasting  Skin: no rashes   Resolved Hospital Problem list    Assessment & Plan:   Multifocal infiltrates presumed to be CAP vs aspiration  Acute on chronic hypoxic respiratory failure  Centrilobular emphysema Radiation pulmonary fibrosis NSCLC, in remission Chronic left loculated effusion  GERD P:  - continue 5 days of ceftriaxone and azithro - aspiration precautions, deemed mild aspiration risk by SLP.  Also could have component of ongoing reflux/ GERD.  Continue PPI BID - no active wheezing, continue triple therapy with Breo and incruse - send for urine legionella (hx of in 2014) and strep - no signs of acute on chronic HF  exacerbation, appears euvolemic  - will need f/u imaging to ensure resolution of infiltrates, can not rule out re-occurring malignancy.  He states he has CT scheduled in March with Dr. Julien Nordmann - chronic left pleural effusion dates back to 2020, no benefit in tapping as will be trapped lung.  - he has very poor functional status at baseline and severe exertional dyspnea.  He may require more O2 from his baseline of 4L moving forward given severe underlying lung disease and lung function may worsen with any exacerbation.  May be advancing to end-stage lung disease and may benefit from palliative care.  [name] to discuss with patient.     Labs   CBC: Recent Labs  Lab 12/11/21 0832 12/12/21 0413 12/13/21 0135 12/14/21 0302  WBC 11.6* 7.6 5.7 5.5  NEUTROABS 10.6*  --  4.2 4.0  HGB 10.9* 9.0* 8.8* 9.2*  HCT 32.7* 28.9* 28.1* 27.9*  MCV 121.1* 125.1* 125.4* 120.8*  PLT 238 199 199 454    Basic Metabolic Panel: Recent Labs  Lab 12/11/21 0832 12/12/21 0413 12/13/21 0135 12/14/21 0302  NA 142 142 142 141  K 3.8 3.8 3.9 3.6  CL 111 111 108 107  CO2 23 24 23 26   GLUCOSE 101* 130* 98 87  BUN 15 23 22 18   CREATININE 1.03 1.20 1.14 0.90  CALCIUM 8.1* 8.3* 8.2* 8.1*   GFR: Estimated Creatinine Clearance: 63.3 mL/min (by C-G formula based on SCr of 0.9 mg/dL). Recent Labs  Lab 12/11/21 0832 12/12/21 0413 12/13/21 0135 12/14/21 0302  WBC 11.6* 7.6 5.7 5.5    Liver Function Tests: Recent Labs  Lab 12/11/21 0832  AST 23  ALT 18  ALKPHOS 89  BILITOT 1.1  PROT 6.1*  ALBUMIN 3.2*   No results for input(s): LIPASE, AMYLASE in the last 168 hours. No results for input(s): AMMONIA in the last 168 hours.  ABG    Component Value Date/Time   PHART 7.474 (H) 03/25/2020 1251   PCO2ART 44.1 03/25/2020 1251   PO2ART 241 (H) 03/25/2020 1251   HCO3 32.3 (H) 03/25/2020 1251   TCO2 31 03/25/2020 1441   ACIDBASEDEF 2.8 (H) 03/15/2020 1411   O2SAT 100.0 03/25/2020 1251      Coagulation Profile: No results for input(s): INR, PROTIME in the last 168 hours.  Cardiac Enzymes: No results for input(s): CKTOTAL, CKMB, CKMBINDEX, TROPONINI in the last 168 hours.  HbA1C: Hgb A1c MFr Bld  Date/Time Value Ref Range Status  03/17/2020 05:26 AM 4.3 (L) 4.8 - 5.6 % Final    Comment:    (NOTE) Pre diabetes:          5.7%-6.4% Diabetes:              >6.4% Glycemic control for   <7.0% adults with diabetes     CBG: No results for input(s): GLUCAP in the last 168 hours.  Review of Systems:   Review of Systems  Constitutional:  Positive for malaise/fatigue. Negative for chills and fever.  HENT:  Negative for congestion, sinus pain and sore throat.   Respiratory:  Positive for cough and shortness of breath. Negative  for hemoptysis, sputum production and wheezing.   Cardiovascular:  Negative for chest pain, orthopnea and leg swelling.  Gastrointestinal:  Negative for nausea and vomiting.  Neurological:  Negative for focal weakness and loss of consciousness.   Past Medical History:  He,  has a past medical history of Acute on chronic respiratory failure with hypoxia ([place]) (03/16/2020), Acute respiratory failure ([place]) (04/09/2013), Carotid artery disease (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   Surgical History:   Past Surgical History:  Procedure Laterality Date   COLONOSCOPY W/ POLYPECTOMY     IR IMAGING GUIDED PORT INSERTION  03/14/2019   IR THORACENTESIS ASP PLEURAL SPACE W/IMG GUIDE  03/26/2020   RIGHT HEART CATH AND CORONARY ANGIOGRAPHY N/A 03/19/2020   Procedure: RIGHT HEART CATH AND CORONARY ANGIOGRAPHY;  Surgeon: [name], MD;  Location: [facility];  Service: Cardiovascular;  Laterality: N/A;   TEE WITHOUT CARDIOVERSION N/A 03/25/2020   Procedure: TRANSESOPHAGEAL ECHOCARDIOGRAM (TEE);  Surgeon: [name], MD;  Location: [facility];  Service: Open Heart Surgery;  Laterality: N/A;   TONSILLECTOMY     TRANSCATHETER AORTIC VALVE REPLACEMENT, TRANSFEMORAL Left 03/25/2020   Procedure: TRANSCATHETER AORTIC VALVE REPLACEMENT, LEFT TRANSFEMORAL;  Surgeon: [name], MD;  Location: [facility];  Service: Open Heart Surgery;  Laterality: Left;   VASECTOMY     VIDEO BRONCHOSCOPY WITH ENDOBRONCHIAL ULTRASOUND N/A 12/06/2018   Procedure: VIDEO BRONCHOSCOPY WITH ENDOBRONCHIAL ULTRASOUND;  Surgeon: Garner Nash, DO;  Location: [facility];  Service: Thoracic;  Laterality: N/A;     Social History:   reports that he quit smoking about 23 years ago. His smoking use included cigarettes. He started smoking about 68 years ago. He has never used smokeless tobacco. He reports that he does not drink alcohol and does not use drugs.   Family History:  His family history includes CVA in his father; Cancer - Cervical in his sister; Cancer - Lung in his mother; Hypertension in his father.   Allergies Allergies  Allergen Reactions   Hydrocodone Bit-Homatrop Mbr     Other reaction(s): dizzy but can take with food     Home Medications  Prior to Admission medications   Medication Sig Start Date End Date Taking? Authorizing Provider  acetaminophen (TYLENOL) 325 MG tablet Take 2 tablets (650 mg total) by mouth every 6  (six) hours as needed for mild pain (or Fever >/= 101). 03/28/20  Yes Weaver, Scott T, PA-C  albuterol (PROVENTIL) (2.5 MG/3ML) 0.083% nebulizer solution Take 3 mLs (2.5 mg total) by nebulization every 4 (four) hours. Patient taking differently: Take 2.5 mg by nebulization every 4 (four) hours as needed for shortness of breath. 06/10/20  Yes Icard, Bradley L, DO  albuterol (VENTOLIN HFA) 108 (90 Base) MCG/ACT inhaler Inhale 2 puffs into the lungs every 6 (six) hours as needed for wheezing or shortness of breath. 01/19/21  Yes Icard, Bradley L, DO  alum & mag hydroxide-simeth (MAALOX/MYLANTA) 200-200-20 MG/5ML suspension Take 15 mLs by mouth  every 6 (six) hours as needed for indigestion or heartburn. 03/28/20  Yes Weaver, Scott T, PA-C  ascorbic acid (VITAMIN C) 500 MG tablet Take 500 mg by mouth daily.   Yes [provider]  aspirin 81 MG chewable tablet Chew 1 tablet (81 mg total) by mouth daily. 03/29/20  Yes Weaver, Scott T, PA-C  atorvastatin (LIPITOR) 40 MG tablet TAKE 1 TABLET (40 MG TOTAL) BY MOUTH DAILY. 07/06/21 07/06/22 Yes Hochrein, Jeneen Rinks, MD  citalopram (CELEXA) 20 MG tablet Take 20 mg by mouth daily. 10/23/21  Yes [provider]  Cyanocobalamin (VITAMIN B 12 PO) Take 1 capsule by mouth daily.   Yes [provider]  Fluticasone-Umeclidin-Vilant (TRELEGY ELLIPTA) 100-62.5-25 MCG/ACT AEPB Inhale 1 puff into the lungs daily. 09/22/21  Yes Icard, Bradley L, DO  furosemide (LASIX) 20 MG tablet TAKE 1 TABLET (20 MG TOTAL) BY MOUTH DAILY. 04/27/21 04/27/22 Yes Hochrein, Jeneen Rinks, MD  ketoconazole (NIZORAL) 2 % cream Apply 1 application topically daily as needed (Skin rash). 12/09/21  Yes [provider]  metoprolol succinate (TOPROL-XL) 25 MG 24 hr tablet TAKE 1 TABLET EVERY DAY Patient taking differently: 25 mg daily. 09/12/21  Yes Minus Breeding, MD  Multiple Vitamin (MULTIVITAMIN) tablet Take 1 tablet by mouth daily.   Yes [provider]  pantoprazole (PROTONIX)  40 MG tablet Take 40 mg by mouth 2 (two) times daily.   Yes [provider]  polycarbophil (FIBERCON) 625 MG tablet Take 625 mg by mouth 2 (two) times daily.    Yes [provider]  silodosin (RAPAFLO) 8 MG CAPS capsule Take 8 mg by mouth daily. 06/03/21  Yes [provider]  COVID-19 mRNA bivalent vaccine, Pfizer, (PFIZER COVID-19 VAC BIVALENT) injection Inject into the muscle. 09/12/21   Carlyle Basques, MD  Fluticasone-Umeclidin-Vilant (TRELEGY ELLIPTA) 100-62.5-25 MCG/INH AEPB Inhale 1 puff into the lungs daily. 01/19/21   Garner Nash, DO            Kennieth Rad, ACNP [facility] 12/14/2021, 3:03 PM

## 2021-12-14 NOTE — TOC Initial Note (Addendum)
Transition of Care [facility]) - Initial/Assessment Note    Patient Details  Name: Robert Dixon MRN: [id number] Date of Birth: [birth year]  Transition of Care [facility]) CM/SW Contact:    Carles Collet, RN Phone Number: 12/14/2021, 10:14 AM  Clinical Narrative:                 Spoke w patient at bedside. He states that he has a 10L O2 concentrator at home with [employer]. He lives at home alone and has friends/ family that come to his house 3 times a week for about 4 hours to help with cooking, cleaning, shopping. He is agreeable to [facility] services, and any highly rated [place] agency on Medicare.gov other than [facility]. Referral made to [facility] and accepted. Patient states that family will transport home. TOC will continue to follow   Spoke w daughter in law and she confirms that family is with him everyday to set up meals and would be available to be him as much as needed at discharge. He gets meals on wheels.   Gloeckner,DeeAnna (Relative)  [id number]   Expected Discharge Plan: Cut Off Barriers to Discharge: Continued Medical Work up   Patient Goals and CMS Choice Patient states their goals for this hospitalization and ongoing recovery are:: to go home CMS Medicare.gov Compare Post Acute Care list provided to:: Patient Choice offered to / list presented to : Patient  Expected Discharge Plan and Services Expected Discharge Plan: [place]   Discharge Planning Services: CM Consult Post Acute Care Choice: [place] arrangements for the past 2 months: Single Family Home                           HH Arranged: PT, OT, Speech Therapy HH Agency: [facility] Date [place]: 12/14/21 Time [place]: [id number] Representative spoke with at [place]: Delsa Sale  Prior Living Arrangements/Services Living arrangements for the past 2 months: [place] with:: Self              Current home services: DME    Activities of  Daily Living Home Assistive Devices/Equipment: Wheelchair, Environmental consultant (specify type), Cane (specify quad or straight) ADL Screening (condition at time of admission) Patient's cognitive ability adequate to safely complete daily activities?: Yes Is the patient deaf or have difficulty hearing?: No Does the patient have difficulty seeing, even when wearing glasses/contacts?: No Does the patient have difficulty concentrating, remembering, or making decisions?: No Patient able to express need for assistance with ADLs?: Yes Does the patient have difficulty dressing or bathing?: Yes Independently performs ADLs?: No Communication: Needs assistance Is this a change from baseline?: Pre-admission baseline Dressing (OT): Needs assistance Grooming: Needs assistance Is this a change from baseline?: Pre-admission baseline Feeding: Needs assistance Is this a change from baseline?: Pre-admission baseline Toileting: Needs assistance Is this a change from baseline?: Pre-admission baseline In/Out Bed: Needs assistance Is this a change from baseline?: Pre-admission baseline Walks in Home: Needs assistance Is this a change from baseline?: Pre-admission baseline Does the patient have difficulty walking or climbing stairs?: No Weakness of Legs: Both Weakness of Arms/Hands: Both  Permission Sought/Granted                  Emotional Assessment              Admission diagnosis:  PNA (pneumonia) [J18.9] Acute on chronic respiratory failure with hypoxia ([place]) [J96.21] Community  acquired pneumonia, bilateral [J18.9] Patient Active Problem List   Diagnosis Date Noted   PNA (pneumonia) 12/11/2021   Healthcare maintenance 09/24/2020   Medication management 06/21/2020   Loss of appetite 06/21/2020   Stenosis of left carotid artery 03/31/2020   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   Acute on chronic systolic CHF (congestive heart failure) ([place]) 03/28/2020   Carotid artery  disease ([place]) 03/28/2020   Acute on chronic respiratory failure with hypoxia ([place]) 03/16/2020   COPD exacerbation ([place]) 03/15/2020   Severe aortic stenosis 10/14/2019   Pericardial effusion 10/14/2019   Urinary hesitancy 09/23/2019   Pleural effusion 09/16/2019   Port-A-Cath in place 04/22/2019   Chronic respiratory failure with hypoxia ([place]) 04/15/2019   Centrilobular emphysema ([place]) 04/14/2019   Encounter for antineoplastic immunotherapy 03/03/2019   Antineoplastic chemotherapy induced anemia 02/03/2019   Non-small cell carcinoma of left lung, stage 3 ([place]) 12/19/2018   Goals of care, counseling/discussion 12/19/2018   Encounter for antineoplastic chemotherapy 12/19/2018   Primary malignant neoplasm of left upper lobe of lung ([place]) 12/12/2018   Hilar mass 12/06/2018   Mediastinal adenopathy 12/06/2018   Conjunctivitis unspecified 04/10/2013   Hypokalemia 04/09/2013   Acute respiratory failure ([place]) 04/09/2013   AKI (acute kidney injury) ([place]) 04/05/2013   CAP (community acquired pneumonia) 04/04/2013   Leukocytosis, unspecified 04/04/2013   HTN (hypertension) 04/04/2013   PCP:  [name], MD Pharmacy:   [facility] [id number], [place] [address] Phone: [phone] Fax: [phone]  [facility] [place], [place] [address] Phone: [phone] Fax: [phone]     Social Determinants of Health (SDOH) Interventions    Readmission Risk Interventions Readmission Risk Prevention Plan 03/18/2020  Transportation Screening Complete  PCP or Specialist Appt within 3-5 Days Complete  HRI or Home Care Consult Complete  Social Work Consult for [place] Planning/Counseling Complete  Palliative Care Screening Not Applicable  Medication Review Press photographer) Complete  Some recent data might be hidden

## 2021-12-14 NOTE — Progress Notes (Signed)
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 Presents for shortness of breath on mild exertion such as walking across his room.  He stated that he had aspiration pneumonia last month and was treated with 7 days of Augmentin. In the ED oxygen saturation was is in the 70s CTA revealed extensive patchy air bronchograms bilaterally new from 12/9 suggestive of progressive, recurrent multilobar pneumonia-underlying metastatic disease difficult to exclude -Chronic radiation fibrosis Patient was started on ceftriaxone and azithromycin in the ED.  Subjective: He admits to having a cough.    Assessment & Plan:   Principal Problem:   PNA (pneumonia)-community-acquired History of pulmonary fibrosis and COPD Acute on chronic respiratory failure - Pulse ox dropped to 71% on 6 L yesterday when ambulated becoming hypoxic today with a small amount of mobility - SLP eval reveals that he is a mild aspiration risk and regular diet with thin liquids is recommended -Antibiotics were started on 1/15 - At this point I am not 878% certain if this is pneumonia-I will consult pulmonary for their opinion  Active Problems: Chronic combined systolic and diastolic heart failure - Echo in 04/21/2020 revealed an EF of 30 to 35% and indeterminate LV diastolic parameters -Repeat echo on 04/12/2021 revealed an EF of 50 to 55% and grade 1 diastolic dysfunction - continue oral Lasix, metoprolol  BPH - Continue Flomax  Macrocytic anemia - Outpatient follow-up    DVT prophylaxis: Lovenox Code Status: DO NOT  RESUSCITATE Level of Care: Level of care: Telemetry Medical Disposition Plan:  Status is: Inpatient  Remains inpatient appropriate because: Need further eval for ongoing hypoxia      Consultants:  Pulmonary Procedures:   Antimicrobials:  Anti-infectives (From admission, onward)    Start     Dose/Rate Route Frequency Ordered Stop   12/12/21 1000  cefTRIAXone (ROCEPHIN) 2 g in sodium chloride 0.9 % 100 mL IVPB        2 g 200 mL/hr over 30 Minutes Intravenous Every 24 hours 12/11/21 1405 12/17/21 0959   12/12/21 1000  azithromycin (ZITHROMAX) 500 mg in sodium chloride 0.9 % 250 mL IVPB        500 mg 250 mL/hr over 60 Minutes Intravenous Every 24 hours 12/11/21 1405 12/17/21 0959   12/11/21 1330  cefTRIAXone (ROCEPHIN) 1 g in sodium chloride 0.9 % 100 mL IVPB        1 g 200 mL/hr over 30 Minutes Intravenous  Once 12/11/21 1329 12/11/21 1626   12/11/21 1330  azithromycin (ZITHROMAX) 500 mg in sodium chloride 0.9 % 250 mL IVPB        500 mg 250 mL/hr over 60 Minutes Intravenous  Once 12/11/21 1329 12/11/21 1703        Objective: Vitals:   12/13/21 2152 12/14/21 0800 12/14/21 0805 12/14/21 0810  BP: 119/60 (!) 144/65    Pulse: 92 93    Resp: 18     Temp: 98.1 F (36.7 C) 97.7 F (36.5 C)    TempSrc: Oral Oral    SpO2: 93% 90% 90% 90%  Weight:      Height:  Intake/Output Summary (Last 24 hours) at 12/14/2021 1315 Last data filed at 12/14/2021 1000 Gross per 24 hour  Intake 720 ml  Output 350 ml  Net 370 ml   Filed Weights   12/11/21 0824  Weight: 72.1 kg    Examination: General exam: Appears comfortable  HEENT: PERRLA, oral mucosa moist, no sclera icterus or thrush Respiratory system: Faint crackles at bases, 90% on 6 L Cardiovascular system: S1 & S2 heard, RRR.   Gastrointestinal system: Abdomen soft, non-tender, nondistended. Normal bowel sounds. Central nervous system: Alert and oriented. No focal neurological deficits. Extremities: No cyanosis,  clubbing or edema Skin: No rashes or ulcers Psychiatry:  Mood & affect appropriate.     Data Reviewed: I have personally reviewed following labs and imaging studies  CBC: Recent Labs  Lab 12/11/21 0832 12/12/21 0413 12/13/21 0135 12/14/21 0302  WBC 11.6* 7.6 5.7 5.5  NEUTROABS 10.6*  --  4.2 4.0  HGB 10.9* 9.0* 8.8* 9.2*  HCT 32.7* 28.9* 28.1* 27.9*  MCV 121.1* 125.1* 125.4* 120.8*  PLT 238 199 199 892   Basic Metabolic Panel: Recent Labs  Lab 12/11/21 0832 12/12/21 0413 12/13/21 0135 12/14/21 0302  NA 142 142 142 141  K 3.8 3.8 3.9 3.6  CL 111 111 108 107  CO2 23 24 23 26   GLUCOSE 101* 130* 98 87  BUN 15 23 22 18   CREATININE 1.03 1.20 1.14 0.90  CALCIUM 8.1* 8.3* 8.2* 8.1*   GFR: Estimated Creatinine Clearance: 63.3 mL/min (by C-G formula based on SCr of 0.9 mg/dL). Liver Function Tests: Recent Labs  Lab 12/11/21 0832  AST 23  ALT 18  ALKPHOS 89  BILITOT 1.1  PROT 6.1*  ALBUMIN 3.2*   No results for input(s): LIPASE, AMYLASE in the last 168 hours. No results for input(s): AMMONIA in the last 168 hours. Coagulation Profile: No results for input(s): INR, PROTIME in the last 168 hours. Cardiac Enzymes: No results for input(s): CKTOTAL, CKMB, CKMBINDEX, TROPONINI in the last 168 hours. BNP (last 3 results) No results for input(s): PROBNP in the last 8760 hours. HbA1C: No results for input(s): HGBA1C in the last 72 hours. CBG: No results for input(s): GLUCAP in the last 168 hours. Lipid Profile: No results for input(s): CHOL, HDL, LDLCALC, TRIG, CHOLHDL, LDLDIRECT in the last 72 hours. Thyroid Function Tests: No results for input(s): TSH, T4TOTAL, FREET4, T3FREE, THYROIDAB in the last 72 hours. Anemia Panel: No results for input(s): VITAMINB12, FOLATE, FERRITIN, TIBC, IRON, RETICCTPCT in the last 72 hours. Urine analysis:    Component Value Date/Time   COLORURINE YELLOW 03/24/2020 2219   APPEARANCEUR CLEAR 03/24/2020 2219   LABSPEC 1.014  03/24/2020 2219   PHURINE 6.0 03/24/2020 2219   GLUCOSEU NEGATIVE 03/24/2020 2219   HGBUR NEGATIVE 03/24/2020 2219   BILIRUBINUR NEGATIVE 03/24/2020 2219   KETONESUR NEGATIVE 03/24/2020 2219   PROTEINUR NEGATIVE 03/24/2020 2219   UROBILINOGEN 1.0 04/04/2013 1331   NITRITE NEGATIVE 03/24/2020 2219   LEUKOCYTESUR NEGATIVE 03/24/2020 2219   Sepsis Labs: @LABRCNTIP (procalcitonin:4,lacticidven:4) ) Recent Results (from the past 240 hour(s))  Resp Panel by RT-PCR (Flu A&B, Covid) Nasopharyngeal Swab     Status: None   Collection Time: 12/11/21  8:59 AM   Specimen: Nasopharyngeal Swab; Nasopharyngeal(NP) swabs in vial transport medium  Result Value Ref Range Status   SARS Coronavirus 2 by RT PCR NEGATIVE NEGATIVE Final    Comment: (NOTE) SARS-CoV-2 target nucleic acids are NOT DETECTED.  The SARS-CoV-2 RNA is generally detectable in upper respiratory specimens  during the acute phase of infection. The lowest concentration of SARS-CoV-2 viral copies this assay can detect is 138 copies/mL. A negative result does not preclude SARS-Cov-2 infection and should not be used as the sole basis for treatment or other patient management decisions. A negative result may occur with  improper specimen collection/handling, submission of specimen other than nasopharyngeal swab, presence of viral mutation(s) within the areas targeted by this assay, and inadequate number of viral copies(<138 copies/mL). A negative result must be combined with clinical observations, patient history, and epidemiological information. The expected result is Negative.  Fact Sheet for Patients:  EntrepreneurPulse.com.au  Fact Sheet for Healthcare Providers:  IncredibleEmployment.be  This test is no t yet approved or cleared by the [place] FDA and  has been authorized for detection and/or diagnosis of SARS-CoV-2 by FDA under an Emergency Use Authorization (EUA). This EUA will remain   in effect (meaning this test can be used) for the duration of the COVID-19 declaration under Section 564(b)(1) of the Act, 21 U.S.C.section 360bbb-3(b)(1), unless the authorization is terminated  or revoked sooner.       Influenza A by PCR NEGATIVE NEGATIVE Final   Influenza B by PCR NEGATIVE NEGATIVE Final    Comment: (NOTE) The Xpert Xpress SARS-CoV-2/FLU/RSV plus assay is intended as an aid in the diagnosis of influenza from Nasopharyngeal swab specimens and should not be used as a sole basis for treatment. Nasal washings and aspirates are unacceptable for Xpert Xpress SARS-CoV-2/FLU/RSV testing.  Fact Sheet for Patients: EntrepreneurPulse.com.au  Fact Sheet for Healthcare Providers: IncredibleEmployment.be  This test is not yet approved or cleared by the [place] FDA and has been authorized for detection and/or diagnosis of SARS-CoV-2 by FDA under an Emergency Use Authorization (EUA). This EUA will remain in effect (meaning this test can be used) for the duration of the COVID-19 declaration under Section 564(b)(1) of the Act, 21 U.S.C. section 360bbb-3(b)(1), unless the authorization is terminated or revoked.  Performed at [facility], [place] [address] [place], [place] [address]   Blood culture (routine x 2)     Status: Abnormal   Collection Time: 12/11/21  3:26 PM   Specimen: BLOOD LEFT HAND  Result Value Ref Range Status   Specimen Description BLOOD LEFT HAND  Final   Special Requests   Final    BOTTLES DRAWN AEROBIC AND ANAEROBIC Blood Culture adequate volume   Culture  Setup Time   Final    GRAM POSITIVE COCCI IN BOTH AEROBIC AND ANAEROBIC BOTTLES CRITICAL RESULT CALLED TO, READ BACK BY AND VERIFIED WITH: PHARMD GREG ABBOTT 12/12/21 @2321  BY JW    Culture (A)  Final    STAPHYLOCOCCUS CAPITIS THE SIGNIFICANCE OF ISOLATING THIS ORGANISM FROM A SINGLE SET OF BLOOD CULTURES WHEN MULTIPLE SETS ARE DRAWN IS UNCERTAIN.  PLEASE NOTIFY THE MICROBIOLOGY DEPARTMENT WITHIN ONE WEEK IF SPECIATION AND SENSITIVITIES ARE REQUIRED. Performed at [facility], [place] [address], [place], [place] [address]    Report Status 12/14/2021 FINAL  Final  Blood Culture ID Panel (Reflexed)     Status: Abnormal   Collection Time: 12/11/21  3:26 PM  Result Value Ref Range Status   Enterococcus faecalis NOT DETECTED NOT DETECTED Final   Enterococcus Faecium NOT DETECTED NOT DETECTED Final   Listeria monocytogenes NOT DETECTED NOT DETECTED Final   Staphylococcus species DETECTED (A) NOT DETECTED Final    Comment: CRITICAL RESULT CALLED TO, READ BACK BY AND VERIFIED WITH: PHARMD GREG ABBOTT 12/12/21 @2321  BY  JW    Staphylococcus aureus (BCID) NOT DETECTED NOT DETECTED Final   Staphylococcus epidermidis NOT DETECTED NOT DETECTED Final   Staphylococcus lugdunensis NOT DETECTED NOT DETECTED Final   Streptococcus species NOT DETECTED NOT DETECTED Final   Streptococcus agalactiae NOT DETECTED NOT DETECTED Final   Streptococcus pneumoniae NOT DETECTED NOT DETECTED Final   Streptococcus pyogenes NOT DETECTED NOT DETECTED Final   A.calcoaceticus-baumannii NOT DETECTED NOT DETECTED Final   Bacteroides fragilis NOT DETECTED NOT DETECTED Final   Enterobacterales NOT DETECTED NOT DETECTED Final   Enterobacter cloacae complex NOT DETECTED NOT DETECTED Final   Escherichia coli NOT DETECTED NOT DETECTED Final   Klebsiella aerogenes NOT DETECTED NOT DETECTED Final   Klebsiella oxytoca NOT DETECTED NOT DETECTED Final   Klebsiella pneumoniae NOT DETECTED NOT DETECTED Final   Proteus species NOT DETECTED NOT DETECTED Final   Salmonella species NOT DETECTED NOT DETECTED Final   Serratia marcescens NOT DETECTED NOT DETECTED Final   Haemophilus influenzae NOT DETECTED NOT DETECTED Final   Neisseria meningitidis NOT DETECTED NOT DETECTED Final   Pseudomonas aeruginosa NOT DETECTED NOT DETECTED Final   Stenotrophomonas maltophilia NOT DETECTED  NOT DETECTED Final   Candida albicans NOT DETECTED NOT DETECTED Final   Candida auris NOT DETECTED NOT DETECTED Final   Candida glabrata NOT DETECTED NOT DETECTED Final   Candida krusei NOT DETECTED NOT DETECTED Final   Candida parapsilosis NOT DETECTED NOT DETECTED Final   Candida tropicalis NOT DETECTED NOT DETECTED Final   Cryptococcus neoformans/gattii NOT DETECTED NOT DETECTED Final    Comment: Performed at [facility], [address] [place], [place] [address]  Blood culture (routine x 2)     Status: None (Preliminary result)   Collection Time: 12/11/21  3:27 PM   Specimen: BLOOD  Result Value Ref Range Status   Specimen Description BLOOD LEFT ANTECUBITAL  Final   Special Requests   Final    BOTTLES DRAWN AEROBIC AND ANAEROBIC Blood Culture adequate volume   Culture   Final    NO GROWTH 3 DAYS Performed at [facility], [place] [address] [place], [place] [address]    Report Status PENDING  Incomplete         Radiology Studies: No results found.    Scheduled Meds:  albuterol  2.5 mg Nebulization TID   aspirin  81 mg Oral Daily   atorvastatin  40 mg Oral Daily   citalopram  20 mg Oral Daily   enoxaparin (LOVENOX) injection  40 mg Subcutaneous Q24H   feeding supplement  237 mL Oral BID BM   fluticasone furoate-vilanterol  1 puff Inhalation Daily   furosemide  20 mg Oral Daily   metoprolol succinate  25 mg Oral Daily   multivitamin with minerals  1 tablet Oral Daily   pantoprazole  40 mg Oral BID   polycarbophil  625 mg Oral BID   tamsulosin  0.4 mg Oral QPC supper   umeclidinium bromide  1 puff Inhalation Daily   Continuous Infusions:  azithromycin 500 mg (12/14/21 1005)   cefTRIAXone (ROCEPHIN)  IV 2 g (12/14/21 0915)     LOS: 3 days      Debbe Odea, MD [facility] Pager: [email or web address] 12/14/2021, 1:15 PM

## 2021-12-14 NOTE — Plan of Care (Signed)
  Problem: Activity: Goal: Ability to tolerate increased activity will improve Outcome: Progressing   

## 2021-12-15 DIAGNOSIS — Z7189 Other specified counseling: Secondary | ICD-10-CM

## 2021-12-15 DIAGNOSIS — J439 Emphysema, unspecified: Secondary | ICD-10-CM

## 2021-12-15 DIAGNOSIS — Z515 Encounter for palliative care: Secondary | ICD-10-CM

## 2021-12-15 LAB — CBC WITH DIFFERENTIAL/PLATELET
Abs Immature Granulocytes: 0.07 10*3/uL (ref 0.00–0.07)
Basophils Absolute: 0.1 10*3/uL (ref 0.0–0.1)
Basophils Relative: 2 %
Eosinophils Absolute: 0.1 10*3/uL (ref 0.0–0.5)
Eosinophils Relative: 2 %
HCT: 28.8 % — ABNORMAL LOW (ref 39.0–52.0)
Hemoglobin: 9.1 g/dL — ABNORMAL LOW (ref 13.0–17.0)
Immature Granulocytes: 1 %
Lymphocytes Relative: 15 %
Lymphs Abs: 0.8 10*3/uL (ref 0.7–4.0)
MCH: 38.7 pg — ABNORMAL HIGH (ref 26.0–34.0)
MCHC: 31.6 g/dL (ref 30.0–36.0)
MCV: 122.6 fL — ABNORMAL HIGH (ref 80.0–100.0)
Monocytes Absolute: 0.5 10*3/uL (ref 0.1–1.0)
Monocytes Relative: 9 %
Neutro Abs: 3.8 10*3/uL (ref 1.7–7.7)
Neutrophils Relative %: 71 %
Platelets: 194 10*3/uL (ref 150–400)
RBC: 2.35 MIL/uL — ABNORMAL LOW (ref 4.22–5.81)
RDW: 14.6 % (ref 11.5–15.5)
WBC: 5.4 10*3/uL (ref 4.0–10.5)
nRBC: 0 % (ref 0.0–0.2)

## 2021-12-15 LAB — BASIC METABOLIC PANEL
Anion gap: 7 (ref 5–15)
BUN: 15 mg/dL (ref 8–23)
CO2: 25 mmol/L (ref 22–32)
Calcium: 8 mg/dL — ABNORMAL LOW (ref 8.9–10.3)
Chloride: 109 mmol/L (ref 98–111)
Creatinine, Ser: 0.99 mg/dL (ref 0.61–1.24)
GFR, Estimated: >60 mL/min (ref >60)
Glucose, Bld: 96 mg/dL (ref 70–99)
Potassium: 3.7 mmol/L (ref 3.5–5.1)
Sodium: 141 mmol/L (ref 135–145)

## 2021-12-15 LAB — STREP PNEUMONIAE URINARY ANTIGEN: Strep Pneumo Urinary Antigen: NEGATIVE

## 2021-12-15 NOTE — Progress Notes (Incomplete)
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 Presents for shortness of breath on mild exertion such as walking across his room.  He stated that he had aspiration pneumonia last month and was treated with 7 days of Augmentin. In the ED oxygen saturation was is in the 70s CTA revealed extensive patchy air bronchograms bilaterally new from 12/9 suggestive of progressive, recurrent multilobar pneumonia-underlying metastatic disease difficult to exclude -Chronic radiation fibrosis Patient was started on ceftriaxone and azithromycin in the ED.  Subjective:  Admits to being more short of breath than usually when he ambulates in the room    Assessment & Plan:   Principal Problem:   PNA (pneumonia)-community-acquired History of pulmonary fibrosis and COPD Acute on chronic respiratory failure - still quite hypoxic on his usual home O2 - SLP eval reveals that he is a mild aspiration risk and regular diet with thin liquids is recommended -Antibiotics were started on 1/15- he is receiving Rocephin and Azithromycin - At this point I am not 546% certain if this is pneumonia- pulm feels we should continue a short course of antibiotics and consult palliative care  - waiting on palliative care  Active Problems: Chronic combined systolic and diastolic heart failure - Echo in 04/21/2020 revealed an EF of 30 to 35% and indeterminate LV diastolic parameters -Repeat echo on 04/12/2021 revealed an EF of 50 to 55% and grade 1 diastolic dysfunction - continue oral Lasix, metoprolol  BPH - Continue  Flomax  Macrocytic anemia - Outpatient follow-up    DVT prophylaxis: Lovenox Code Status: DO NOT RESUSCITATE Level of Care: Level of care: Telemetry Medical Disposition Plan:  Status is: Inpatient  Remains inpatient appropriate because: cont IV antibiotics and follow pulse ox  Consultants:  Pulmonary Palliative care Procedures:   Antimicrobials:  Anti-infectives (From admission, onward)    Start     Dose/Rate Route Frequency Ordered Stop   12/12/21 1000  cefTRIAXone (ROCEPHIN) 2 g in sodium chloride 0.9 % 100 mL IVPB        2 g 200 mL/hr over 30 Minutes Intravenous Every 24 hours 12/11/21 1405 12/17/21 0959   12/12/21 1000  azithromycin (ZITHROMAX) 500 mg in sodium chloride 0.9 % 250 mL IVPB        500 mg 250 mL/hr over 60 Minutes Intravenous Every 24 hours 12/11/21 1405 12/17/21 0959   12/11/21 1330  cefTRIAXone (ROCEPHIN) 1 g in sodium chloride 0.9 % 100 mL IVPB        1 g 200 mL/hr over 30 Minutes Intravenous  Once 12/11/21 1329 12/11/21 1626   12/11/21 1330  azithromycin (ZITHROMAX) 500 mg in sodium chloride 0.9 % 250 mL IVPB        500 mg 250 mL/hr over 60 Minutes Intravenous  Once 12/11/21 1329 12/11/21 1703        Objective: Vitals:   12/14/21 2149 12/15/21 0451 12/15/21 0921 12/15/21 0923  BP: 119/65 111/70  130/78  Pulse: 93 83  91  Resp: 20 19  17   Temp: (!) 97.5 F (36.4 C) 97.7 F (36.5 C)  98.3 F (36.8 C)  TempSrc: Oral Oral    SpO2: 93% 92% 92% 91%  Weight:      Height:        Intake/Output Summary (Last 24 hours) at 12/15/2021 1044 Last data filed at 12/15/2021 5465 Gross per 24 hour  Intake 1892.58 ml  Output 950 ml  Net 942.58 ml    Filed Weights   12/11/21 0824  Weight: 72.1 kg    Examination: General exam: Appears comfortable  HEENT: PERRLA, oral mucosa moist, no sclera icterus or thrush Respiratory system: Faint crackles at bases  Cardiovascular system: S1 & S2 heard, RRR.   Gastrointestinal system: Abdomen soft,  non-tender, nondistended. Normal bowel sounds. Central nervous system: Alert and oriented. No focal neurological deficits. Extremities: No cyanosis, clubbing or edema Skin: No rashes or ulcers Psychiatry:  Mood & affect appropriate.     Data Reviewed: I have personally reviewed following labs and imaging studies  CBC: Recent Labs  Lab 12/11/21 0832 12/12/21 0413 12/13/21 0135 12/14/21 0302 12/15/21 0219  WBC 11.6* 7.6 5.7 5.5 5.4  NEUTROABS 10.6*  --  4.2 4.0 3.8  HGB 10.9* 9.0* 8.8* 9.2* 9.1*  HCT 32.7* 28.9* 28.1* 27.9* 28.8*  MCV 121.1* 125.1* 125.4* 120.8* 122.6*  PLT 238 199 199 213 035    Basic Metabolic Panel: Recent Labs  Lab 12/11/21 0832 12/12/21 0413 12/13/21 0135 12/14/21 0302 12/15/21 0219  NA 142 142 142 141 141  K 3.8 3.8 3.9 3.6 3.7  CL 111 111 108 107 109  CO2 23 24 23 26 25   GLUCOSE 101* 130* 98 87 96  BUN 15 23 22 18 15   CREATININE 1.03 1.20 1.14 0.90 0.99  CALCIUM 8.1* 8.3* 8.2* 8.1* 8.0*    GFR: Estimated Creatinine Clearance: 57.6 mL/min (by C-G formula based on SCr of 0.99 mg/dL). Liver Function Tests: Recent Labs  Lab 12/11/21 0832  AST 23  ALT 18  ALKPHOS 89  BILITOT 1.1  PROT 6.1*  ALBUMIN 3.2*    No results for input(s): LIPASE, AMYLASE in the last 168 hours. No results for input(s): AMMONIA in the last 168 hours. Coagulation Profile: No results for input(s): INR, PROTIME in the last 168 hours. Cardiac Enzymes: No results for input(s): CKTOTAL, CKMB, CKMBINDEX, TROPONINI in the last 168 hours. BNP (last 3 results) No results for input(s): PROBNP in the last 8760 hours. HbA1C: No results for input(s): HGBA1C in the last 72 hours. CBG: No results for input(s): GLUCAP in the last 168 hours. Lipid Profile: No results for input(s): CHOL, HDL, LDLCALC, TRIG, CHOLHDL, LDLDIRECT in the last 72 hours. Thyroid Function Tests: No results for input(s): TSH, T4TOTAL, FREET4, T3FREE, THYROIDAB in the last 72 hours. Anemia  Panel: No results for input(s): VITAMINB12, FOLATE, FERRITIN, TIBC, IRON, RETICCTPCT in the last 72 hours. Urine analysis:    Component Value Date/Time   COLORURINE YELLOW 03/24/2020 2219   APPEARANCEUR CLEAR 03/24/2020 2219   LABSPEC 1.014 03/24/2020 2219   PHURINE 6.0 03/24/2020 2219   GLUCOSEU NEGATIVE 03/24/2020 2219   HGBUR NEGATIVE 03/24/2020 2219   BILIRUBINUR NEGATIVE 03/24/2020 2219   KETONESUR NEGATIVE 03/24/2020 2219   PROTEINUR NEGATIVE 03/24/2020 2219   UROBILINOGEN 1.0 04/04/2013 1331   NITRITE NEGATIVE 03/24/2020 2219   LEUKOCYTESUR NEGATIVE 03/24/2020 2219   Sepsis Labs: @LABRCNTIP (procalcitonin:4,lacticidven:4) ) Recent Results (from the past 240 hour(s))  Resp Panel by RT-PCR (Flu A&B, Covid) Nasopharyngeal Swab     Status: None   Collection Time: 12/11/21  8:59 AM   Specimen: Nasopharyngeal Swab; Nasopharyngeal(NP)  swabs in vial transport medium  Result Value Ref Range Status   SARS Coronavirus 2 by RT PCR NEGATIVE NEGATIVE Final    Comment: (NOTE) SARS-CoV-2 target nucleic acids are NOT DETECTED.  The SARS-CoV-2 RNA is generally detectable in upper respiratory specimens during the acute phase of infection. The lowest concentration of SARS-CoV-2 viral copies this assay can detect is 138 copies/mL. A negative result does not preclude SARS-Cov-2 infection and should not be used as the sole basis for treatment or other patient management decisions. A negative result may occur with  improper specimen collection/handling, submission of specimen other than nasopharyngeal swab, presence of viral mutation(s) within the areas targeted by this assay, and inadequate number of viral copies(<138 copies/mL). A negative result must be combined with clinical observations, patient history, and epidemiological information. The expected result is Negative.  Fact Sheet for Patients:  EntrepreneurPulse.com.au  Fact Sheet for Healthcare Providers:   IncredibleEmployment.be  This test is no t yet approved or cleared by the [place] FDA and  has been authorized for detection and/or diagnosis of SARS-CoV-2 by FDA under an Emergency Use Authorization (EUA). This EUA will remain  in effect (meaning this test can be used) for the duration of the COVID-19 declaration under Section 564(b)(1) of the Act, 21 U.S.C.section 360bbb-3(b)(1), unless the authorization is terminated  or revoked sooner.       Influenza A by PCR NEGATIVE NEGATIVE Final   Influenza B by PCR NEGATIVE NEGATIVE Final    Comment: (NOTE) The Xpert Xpress SARS-CoV-2/FLU/RSV plus assay is intended as an aid in the diagnosis of influenza from Nasopharyngeal swab specimens and should not be used as a sole basis for treatment. Nasal washings and aspirates are unacceptable for Xpert Xpress SARS-CoV-2/FLU/RSV testing.  Fact Sheet for Patients: EntrepreneurPulse.com.au  Fact Sheet for Healthcare Providers: IncredibleEmployment.be  This test is not yet approved or cleared by the [place] FDA and has been authorized for detection and/or diagnosis of SARS-CoV-2 by FDA under an Emergency Use Authorization (EUA). This EUA will remain in effect (meaning this test can be used) for the duration of the COVID-19 declaration under Section 564(b)(1) of the Act, 21 U.S.C. section 360bbb-3(b)(1), unless the authorization is terminated or revoked.  Performed at [facility], [place] [address], [place], [place] [address]   Blood culture (routine x 2)     Status: Abnormal   Collection Time: 12/11/21  3:26 PM   Specimen: BLOOD LEFT HAND  Result Value Ref Range Status   Specimen Description BLOOD LEFT HAND  Final   Special Requests   Final    BOTTLES DRAWN AEROBIC AND ANAEROBIC Blood Culture adequate volume   Culture  Setup Time   Final    GRAM POSITIVE COCCI IN BOTH AEROBIC AND ANAEROBIC BOTTLES CRITICAL RESULT  CALLED TO, READ BACK BY AND VERIFIED WITH: PHARMD GREG ABBOTT 12/12/21 @2321  BY JW    Culture (A)  Final    STAPHYLOCOCCUS CAPITIS THE SIGNIFICANCE OF ISOLATING THIS ORGANISM FROM A SINGLE SET OF BLOOD CULTURES WHEN MULTIPLE SETS ARE DRAWN IS UNCERTAIN. PLEASE NOTIFY THE MICROBIOLOGY DEPARTMENT WITHIN ONE WEEK IF SPECIATION AND SENSITIVITIES ARE REQUIRED. Performed at [facility], [place] [address] [place], [place] [address]    Report Status 12/14/2021 FINAL  Final  Blood Culture ID Panel (Reflexed)     Status: Abnormal   Collection Time: 12/11/21  3:26 PM  Result Value Ref Range Status   Enterococcus faecalis NOT DETECTED NOT DETECTED Final  Enterococcus Faecium NOT DETECTED NOT DETECTED Final   Listeria monocytogenes NOT DETECTED NOT DETECTED Final   Staphylococcus species DETECTED (A) NOT DETECTED Final    Comment: CRITICAL RESULT CALLED TO, READ BACK BY AND VERIFIED WITH: PHARMD GREG ABBOTT 12/12/21 @2321  BY JW    Staphylococcus aureus (BCID) NOT DETECTED NOT DETECTED Final   Staphylococcus epidermidis NOT DETECTED NOT DETECTED Final   Staphylococcus lugdunensis NOT DETECTED NOT DETECTED Final   Streptococcus species NOT DETECTED NOT DETECTED Final   Streptococcus agalactiae NOT DETECTED NOT DETECTED Final   Streptococcus pneumoniae NOT DETECTED NOT DETECTED Final   Streptococcus pyogenes NOT DETECTED NOT DETECTED Final   A.calcoaceticus-baumannii NOT DETECTED NOT DETECTED Final   Bacteroides fragilis NOT DETECTED NOT DETECTED Final   Enterobacterales NOT DETECTED NOT DETECTED Final   Enterobacter cloacae complex NOT DETECTED NOT DETECTED Final   Escherichia coli NOT DETECTED NOT DETECTED Final   Klebsiella aerogenes NOT DETECTED NOT DETECTED Final   Klebsiella oxytoca NOT DETECTED NOT DETECTED Final   Klebsiella pneumoniae NOT DETECTED NOT DETECTED Final   Proteus species NOT DETECTED NOT DETECTED Final   Salmonella species NOT DETECTED NOT DETECTED Final   Serratia  marcescens NOT DETECTED NOT DETECTED Final   Haemophilus influenzae NOT DETECTED NOT DETECTED Final   Neisseria meningitidis NOT DETECTED NOT DETECTED Final   Pseudomonas aeruginosa NOT DETECTED NOT DETECTED Final   Stenotrophomonas maltophilia NOT DETECTED NOT DETECTED Final   Candida albicans NOT DETECTED NOT DETECTED Final   Candida auris NOT DETECTED NOT DETECTED Final   Candida glabrata NOT DETECTED NOT DETECTED Final   Candida krusei NOT DETECTED NOT DETECTED Final   Candida parapsilosis NOT DETECTED NOT DETECTED Final   Candida tropicalis NOT DETECTED NOT DETECTED Final   Cryptococcus neoformans/gattii NOT DETECTED NOT DETECTED Final    Comment: Performed at [name], [address], [place], [place] [address]  Blood culture (routine x 2)     Status: None (Preliminary result)   Collection Time: 12/11/21  3:27 PM   Specimen: BLOOD  Result Value Ref Range Status   Specimen Description BLOOD LEFT ANTECUBITAL  Final   Special Requests   Final    BOTTLES DRAWN AEROBIC AND ANAEROBIC Blood Culture adequate volume   Culture   Final    NO GROWTH 4 DAYS Performed at [facility], [place] [address], [place], [place] [address]    Report Status PENDING  Incomplete         Radiology Studies: No results found.    Scheduled Meds:  aspirin  81 mg Oral Daily   atorvastatin  40 mg Oral Daily   citalopram  20 mg Oral Daily   enoxaparin (LOVENOX) injection  40 mg Subcutaneous Q24H   feeding supplement  237 mL Oral BID BM   fluticasone furoate-vilanterol  1 puff Inhalation Daily   furosemide  20 mg Oral Daily   metoprolol succinate  25 mg Oral Daily   multivitamin with minerals  1 tablet Oral Daily   pantoprazole  40 mg Oral BID   polycarbophil  625 mg Oral BID   tamsulosin  0.4 mg Oral QPC supper   umeclidinium bromide  1 puff Inhalation Daily   Continuous Infusions:  azithromycin 500 mg (12/15/21 1031)   cefTRIAXone (ROCEPHIN)  IV 2 g (12/15/21 0936)     LOS: 4  days      Debbe Odea, MD [facility] Pager: [email or web address] 12/15/2021, 10:44 AM

## 2021-12-15 NOTE — Progress Notes (Addendum)
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 Presents for shortness of breath on mild exertion such as walking across his room.  He stated that he had aspiration pneumonia last month and was treated with 7 days of Augmentin. In the ED oxygen saturation was is in the 70s CTA revealed extensive patchy air bronchograms bilaterally new from 12/9 suggestive of progressive, recurrent multilobar pneumonia-underlying metastatic disease difficult to exclude -Chronic radiation fibrosis Patient was started on ceftriaxone and azithromycin in the ED.  Subjective: He states he feels short of breath when ambulating in the room.     Assessment & Plan:   Principal Problem:   PNA (pneumonia)-community-acquired History of pulmonary fibrosis and COPD Acute on chronic respiratory failure - still becoming hypoxic even at rest today- very short of breath when he ambulates - SLP eval reveals that he is a mild aspiration risk and regular diet with thin liquids is recommended which is ordered -Antibiotics were started on 1/15 for CAP - I have consulted PCCM for guidance. Have been recommended to do a short course of antibiotics (7 days recommended upon my discussion with Dr Valeta Harms today) and consult palliative care due to his poor overall prognosis- I feel he may be a candidate for hospice at home and will consult them  Active Problems: Chronic combined systolic and diastolic heart failure - Echo in 04/21/2020 revealed an EF of 30 to 35% and indeterminate LV diastolic parameters -Repeat echo on 04/12/2021 revealed  an EF of 50 to 55% and grade 1 diastolic dysfunction - continue oral Lasix, metoprolol  BPH - Continue Flomax  Macrocytic anemia - follow intermittently     DVT prophylaxis: Lovenox Code Status: DO NOT RESUSCITATE Level of Care: Level of care: Telemetry Medical Disposition Plan:  Status is: Inpatient  Remains inpatient appropriate because: has ongoing hypoxia- awaiting palliative consult     Consultants:  Pulmonary Palliative  Procedures:   Antimicrobials:  Anti-infectives (From admission, onward)    Start     Dose/Rate Route Frequency Ordered Stop   12/12/21 1000  cefTRIAXone (ROCEPHIN) 2 g in sodium chloride 0.9 % 100 mL IVPB        2 g 200 mL/hr over 30 Minutes Intravenous Every 24 hours 12/11/21 1405 12/17/21 0959   12/12/21 1000  azithromycin (ZITHROMAX) 500 mg in sodium chloride 0.9 % 250 mL IVPB        500 mg 250 mL/hr over 60 Minutes Intravenous Every 24 hours 12/11/21 1405 12/17/21 0959   12/11/21 1330  cefTRIAXone (ROCEPHIN) 1 g in sodium chloride 0.9 % 100 mL IVPB        1 g 200 mL/hr over 30 Minutes Intravenous  Once 12/11/21 1329 12/11/21 1626   12/11/21 1330  azithromycin (ZITHROMAX) 500 mg in sodium chloride 0.9 % 250 mL IVPB        500 mg 250 mL/hr over 60 Minutes Intravenous  Once 12/11/21 1329 12/11/21 1703        Objective: Vitals:   12/15/21 1150 12/15/21 1153 12/15/21 1154 12/15/21 1155  BP:      Pulse:  Resp:      Temp:      TempSrc:      SpO2: 94% 90% (!) 89% 90%  Weight:      Height:        Intake/Output Summary (Last 24 hours) at 12/15/2021 1158 Last data filed at 12/15/2021 7169 Gross per 24 hour  Intake 1892.58 ml  Output 950 ml  Net 942.58 ml    Filed Weights   12/11/21 0824  Weight: 72.1 kg    Examination: General exam: Appears comfortable  HEENT: PERRLA, oral mucosa moist, no sclera icterus or thrush Respiratory system: Faint crackles at bases  Cardiovascular system: S1 & S2 heard, RRR.   Gastrointestinal  system: Abdomen soft, non-tender, nondistended. Normal bowel sounds. Central nervous system: Alert and oriented. No focal neurological deficits. Extremities: No cyanosis, clubbing or edema Skin: No rashes or ulcers Psychiatry:  Mood & affect appropriate.     Data Reviewed: I have personally reviewed following labs and imaging studies  CBC: Recent Labs  Lab 12/11/21 0832 12/12/21 0413 12/13/21 0135 12/14/21 0302 12/15/21 0219  WBC 11.6* 7.6 5.7 5.5 5.4  NEUTROABS 10.6*  --  4.2 4.0 3.8  HGB 10.9* 9.0* 8.8* 9.2* 9.1*  HCT 32.7* 28.9* 28.1* 27.9* 28.8*  MCV 121.1* 125.1* 125.4* 120.8* 122.6*  PLT 238 199 199 213 678    Basic Metabolic Panel: Recent Labs  Lab 12/11/21 0832 12/12/21 0413 12/13/21 0135 12/14/21 0302 12/15/21 0219  NA 142 142 142 141 141  K 3.8 3.8 3.9 3.6 3.7  CL 111 111 108 107 109  CO2 23 24 23 26 25   GLUCOSE 101* 130* 98 87 96  BUN 15 23 22 18 15   CREATININE 1.03 1.20 1.14 0.90 0.99  CALCIUM 8.1* 8.3* 8.2* 8.1* 8.0*    GFR: Estimated Creatinine Clearance: 57.6 mL/min (by C-G formula based on SCr of 0.99 mg/dL). Liver Function Tests: Recent Labs  Lab 12/11/21 0832  AST 23  ALT 18  ALKPHOS 89  BILITOT 1.1  PROT 6.1*  ALBUMIN 3.2*    No results for input(s): LIPASE, AMYLASE in the last 168 hours. No results for input(s): AMMONIA in the last 168 hours. Coagulation Profile: No results for input(s): INR, PROTIME in the last 168 hours. Cardiac Enzymes: No results for input(s): CKTOTAL, CKMB, CKMBINDEX, TROPONINI in the last 168 hours. BNP (last 3 results) No results for input(s): PROBNP in the last 8760 hours. HbA1C: No results for input(s): HGBA1C in the last 72 hours. CBG: No results for input(s): GLUCAP in the last 168 hours. Lipid Profile: No results for input(s): CHOL, HDL, LDLCALC, TRIG, CHOLHDL, LDLDIRECT in the last 72 hours. Thyroid Function Tests: No results for input(s): TSH, T4TOTAL, FREET4, T3FREE, THYROIDAB in the last 72  hours. Anemia Panel: No results for input(s): VITAMINB12, FOLATE, FERRITIN, TIBC, IRON, RETICCTPCT in the last 72 hours. Urine analysis:    Component Value Date/Time   COLORURINE YELLOW 03/24/2020 2219   APPEARANCEUR CLEAR 03/24/2020 2219   LABSPEC 1.014 03/24/2020 2219   PHURINE 6.0 03/24/2020 2219   GLUCOSEU NEGATIVE 03/24/2020 2219   HGBUR NEGATIVE 03/24/2020 2219   BILIRUBINUR NEGATIVE 03/24/2020 2219   KETONESUR NEGATIVE 03/24/2020 2219   PROTEINUR NEGATIVE 03/24/2020 2219   UROBILINOGEN 1.0 04/04/2013 1331   NITRITE NEGATIVE 03/24/2020 2219   LEUKOCYTESUR NEGATIVE 03/24/2020 2219   Sepsis Labs: @LABRCNTIP (procalcitonin:4,lacticidven:4) ) Recent Results (from the past 240 hour(s))  Resp Panel by RT-PCR (Flu A&B, Covid) Nasopharyngeal Swab     Status: None  Collection Time: 12/11/21  8:59 AM   Specimen: Nasopharyngeal Swab; Nasopharyngeal(NP) swabs in vial transport medium  Result Value Ref Range Status   SARS Coronavirus 2 by RT PCR NEGATIVE NEGATIVE Final    Comment: (NOTE) SARS-CoV-2 target nucleic acids are NOT DETECTED.  The SARS-CoV-2 RNA is generally detectable in upper respiratory specimens during the acute phase of infection. The lowest concentration of SARS-CoV-2 viral copies this assay can detect is 138 copies/mL. A negative result does not preclude SARS-Cov-2 infection and should not be used as the sole basis for treatment or other patient management decisions. A negative result may occur with  improper specimen collection/handling, submission of specimen other than nasopharyngeal swab, presence of viral mutation(s) within the areas targeted by this assay, and inadequate number of viral copies(<138 copies/mL). A negative result must be combined with clinical observations, patient history, and epidemiological information. The expected result is Negative.  Fact Sheet for Patients:  EntrepreneurPulse.com.au  Fact Sheet for Healthcare  Providers:  IncredibleEmployment.be  This test is no t yet approved or cleared by the [place] FDA and  has been authorized for detection and/or diagnosis of SARS-CoV-2 by FDA under an Emergency Use Authorization (EUA). This EUA will remain  in effect (meaning this test can be used) for the duration of the COVID-19 declaration under Section 564(b)(1) of the Act, 21 U.S.C.section 360bbb-3(b)(1), unless the authorization is terminated  or revoked sooner.       Influenza A by PCR NEGATIVE NEGATIVE Final   Influenza B by PCR NEGATIVE NEGATIVE Final    Comment: (NOTE) The Xpert Xpress SARS-CoV-2/FLU/RSV plus assay is intended as an aid in the diagnosis of influenza from Nasopharyngeal swab specimens and should not be used as a sole basis for treatment. Nasal washings and aspirates are unacceptable for Xpert Xpress SARS-CoV-2/FLU/RSV testing.  Fact Sheet for Patients: EntrepreneurPulse.com.au  Fact Sheet for Healthcare Providers: IncredibleEmployment.be  This test is not yet approved or cleared by the [place] FDA and has been authorized for detection and/or diagnosis of SARS-CoV-2 by FDA under an Emergency Use Authorization (EUA). This EUA will remain in effect (meaning this test can be used) for the duration of the COVID-19 declaration under Section 564(b)(1) of the Act, 21 U.S.C. section 360bbb-3(b)(1), unless the authorization is terminated or revoked.  Performed at [facility], [place] [address], [place], [place] [address]   Blood culture (routine x 2)     Status: Abnormal   Collection Time: 12/11/21  3:26 PM   Specimen: BLOOD LEFT HAND  Result Value Ref Range Status   Specimen Description BLOOD LEFT HAND  Final   Special Requests   Final    BOTTLES DRAWN AEROBIC AND ANAEROBIC Blood Culture adequate volume   Culture  Setup Time   Final    GRAM POSITIVE COCCI IN BOTH AEROBIC AND ANAEROBIC  BOTTLES CRITICAL RESULT CALLED TO, READ BACK BY AND VERIFIED WITH: PHARMD GREG ABBOTT 12/12/21 @2321  BY JW    Culture (A)  Final    STAPHYLOCOCCUS CAPITIS THE SIGNIFICANCE OF ISOLATING THIS ORGANISM FROM A SINGLE SET OF BLOOD CULTURES WHEN MULTIPLE SETS ARE DRAWN IS UNCERTAIN. PLEASE NOTIFY THE MICROBIOLOGY DEPARTMENT WITHIN ONE WEEK IF SPECIATION AND SENSITIVITIES ARE REQUIRED. Performed at [facility], [place] [address] [place], [place] [address]    Report Status 12/14/2021 FINAL  Final  Blood Culture ID Panel (Reflexed)     Status: Abnormal   Collection Time: 12/11/21  3:26 PM  Result Value Ref Range  Status   Enterococcus faecalis NOT DETECTED NOT DETECTED Final   Enterococcus Faecium NOT DETECTED NOT DETECTED Final   Listeria monocytogenes NOT DETECTED NOT DETECTED Final   Staphylococcus species DETECTED (A) NOT DETECTED Final    Comment: CRITICAL RESULT CALLED TO, READ BACK BY AND VERIFIED WITH: PHARMD GREG ABBOTT 12/12/21 @2321  BY JW    Staphylococcus aureus (BCID) NOT DETECTED NOT DETECTED Final   Staphylococcus epidermidis NOT DETECTED NOT DETECTED Final   Staphylococcus lugdunensis NOT DETECTED NOT DETECTED Final   Streptococcus species NOT DETECTED NOT DETECTED Final   Streptococcus agalactiae NOT DETECTED NOT DETECTED Final   Streptococcus pneumoniae NOT DETECTED NOT DETECTED Final   Streptococcus pyogenes NOT DETECTED NOT DETECTED Final   A.calcoaceticus-baumannii NOT DETECTED NOT DETECTED Final   Bacteroides fragilis NOT DETECTED NOT DETECTED Final   Enterobacterales NOT DETECTED NOT DETECTED Final   Enterobacter cloacae complex NOT DETECTED NOT DETECTED Final   Escherichia coli NOT DETECTED NOT DETECTED Final   Klebsiella aerogenes NOT DETECTED NOT DETECTED Final   Klebsiella oxytoca NOT DETECTED NOT DETECTED Final   Klebsiella pneumoniae NOT DETECTED NOT DETECTED Final   Proteus species NOT DETECTED NOT DETECTED Final   Salmonella species NOT DETECTED NOT DETECTED  Final   Serratia marcescens NOT DETECTED NOT DETECTED Final   Haemophilus influenzae NOT DETECTED NOT DETECTED Final   Neisseria meningitidis NOT DETECTED NOT DETECTED Final   Pseudomonas aeruginosa NOT DETECTED NOT DETECTED Final   Stenotrophomonas maltophilia NOT DETECTED NOT DETECTED Final   Candida albicans NOT DETECTED NOT DETECTED Final   Candida auris NOT DETECTED NOT DETECTED Final   Candida glabrata NOT DETECTED NOT DETECTED Final   Candida krusei NOT DETECTED NOT DETECTED Final   Candida parapsilosis NOT DETECTED NOT DETECTED Final   Candida tropicalis NOT DETECTED NOT DETECTED Final   Cryptococcus neoformans/gattii NOT DETECTED NOT DETECTED Final    Comment: Performed at [facility], [address], [place], [place] [address]  Blood culture (routine x 2)     Status: None (Preliminary result)   Collection Time: 12/11/21  3:27 PM   Specimen: BLOOD  Result Value Ref Range Status   Specimen Description BLOOD LEFT ANTECUBITAL  Final   Special Requests   Final    BOTTLES DRAWN AEROBIC AND ANAEROBIC Blood Culture adequate volume   Culture   Final    NO GROWTH 4 DAYS Performed at [facility], [place] [address], [place], [place] [address]    Report Status PENDING  Incomplete         Radiology Studies: No results found.    Scheduled Meds:  aspirin  81 mg Oral Daily   atorvastatin  40 mg Oral Daily   citalopram  20 mg Oral Daily   enoxaparin (LOVENOX) injection  40 mg Subcutaneous Q24H   feeding supplement  237 mL Oral BID BM   fluticasone furoate-vilanterol  1 puff Inhalation Daily   furosemide  20 mg Oral Daily   metoprolol succinate  25 mg Oral Daily   multivitamin with minerals  1 tablet Oral Daily   pantoprazole  40 mg Oral BID   polycarbophil  625 mg Oral BID   tamsulosin  0.4 mg Oral QPC supper   umeclidinium bromide  1 puff Inhalation Daily   Continuous Infusions:  azithromycin 500 mg (12/15/21 1031)   cefTRIAXone (ROCEPHIN)  IV 2 g (12/15/21  0936)     LOS: 4 days      Debbe Odea, MD [facility] Pager: [email or web address] 12/15/2021, 11:58 AM

## 2021-12-15 NOTE — TOC Progression Note (Signed)
Transition of Care [facility]) - Progression Note    Patient Details  Name: Robert Dixon MRN: [id number] Date of Birth: 08-12-1941  [facility] of [facility]) CM/SW Contact  Tom-Johnson, Renea Ee, RN Phone Number: 12/15/2021, 12:07 PM  Clinical Narrative:     CM notified by Bradd Canary, [place] health Account Executive that OT/SLP orders needs to be added to home health orders as patient was active with these services prior hospitalization. Orders placed. CM will continue to follow with needs.   Expected Discharge Plan: [place] Barriers to Discharge: Continued Medical Work up  Expected Discharge Plan and Services Expected Discharge Plan: [place]   Discharge Planning Services: CM Consult Post Acute Care Choice: [place] arrangements for the past 2 months: Single Family Home                           HH Arranged: PT, OT, Speech Therapy HH Agency: [facility] Date [place]: 12/14/21 Time [place]: [id number] Representative spoke with at [place]: [place] ([place]) Interventions    Readmission Risk Interventions Readmission Risk Prevention Plan 03/18/2020  Transportation Screening Complete  PCP or Specialist Appt within 3-5 Days Complete  HRI or [place] Complete  Social Work Consult for [place] Planning/Counseling Complete  Palliative Care Screening Not Applicable  Medication Review Press photographer) Complete  Some recent data might be hidden

## 2021-12-15 NOTE — Progress Notes (Signed)
Speech Language Pathology Treatment: Dysphagia  Patient Details Name: Robert Dixon MRN: [id number] DOB: 1941-03-08 Today's Date: 12/15/2021 Time: 6945-0388 SLP Time Calculation (min) (ACUTE ONLY): 12 min  Assessment / Plan / Recommendation Clinical Impression  Patient seen for skilled ST session focused on dysphagia goals. When patient initially entered the room, patient sitting on EOB with meal tray in front of him but very SOB and told SLP he was going to have to hold off until he "recovers". Both in agreement with plan for SLP to return in 20 minutes. When SLP did return, patient had consumed majority of his lunch and was finishing up. Voice was normal, no overt s/s aspiration or penetration and patient with more relaxed breathing pattern. SLP reviewed recommendations for patient to continue to prioritize breathing over eating/drinking and to take rest breaks during meals, etc. Patient understanding of recommendations and SLP is not recommending further acute-care SLP f/u.    HPI HPI: 81 y.o. 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     Patient reported that last month he had an episode of aspiration pneumonia was treated with 7 days course of Augmentin.  Increasing for the last few weeks, patient developed exertional dyspnea gradually getting worse, now even walking inside his room causing severe shortness of breath.  He also developed productive cough with whitish phlegm.  Occasionally he feels " burning sensation in the chest and cannot take deep breath".  He takes PPI twice daily and the family has his had elevated while sleeping and lying in the bed.  Patient denied  any choking or cough after eating or drinking and he eats regular diet.  Yesterday family noticed patient O2 saturation in the low 80s and increased his oxygen from 4 to 5 L however O2 saturation not improving overnight.      SLP Plan  Discharge SLP treatment due to (comment) (goals met, education complete)      Recommendations for follow up therapy are one component of a multi-disciplinary discharge planning process, led by the attending physician.  Recommendations may be updated based on patient status, additional functional criteria and insurance authorization.    Recommendations  Diet recommendations: Regular;Thin liquid Liquids provided via: Cup;No straw Medication Administration: Whole meds with puree Supervision: Patient able to self feed Compensations: Small sips/bites;Slow rate;Other (Comment) (rest breaks during meal) Postural Changes and/or Swallow Maneuvers: Seated upright 90 degrees                Oral Care Recommendations: Oral care BID Follow Up Recommendations: Home health SLP (per TOC note, patient was previously an active [place] patient for SLP services and so f/u may be beneficial.) Assistance recommended at discharge: Intermittent Supervision/Assistance SLP Visit Diagnosis: Dysphagia, unspecified (R13.10) Plan: Discharge SLP treatment due to (comment) (goals met, education complete)           Sonia Baller, MA, CCC-SLP Speech Therapy

## 2021-12-15 NOTE — Progress Notes (Signed)
PT Cancellation Note  Patient Details Name: Robert Dixon MRN: [id number] DOB: 1941-09-07   Cancelled Treatment:    Reason Eval/Treat Not Completed: Medical issues which prohibited therapy. Upon PT arrival pt on 6L [place], saturating at 80% lying in bed. PT increases pt to 8L [place] with improvement to low 80s. Pt requires increased to 10L [place] to reach sats of 90%, but ranges from 87-90% at rest on 10L [place]. PT defers mobility at this time due to increased oxygen needs at rest. PT will attempt to return as time allows.   Zenaida Niece 12/15/2021, 11:44 AM

## 2021-12-15 NOTE — Consult Note (Addendum)
Consultation Note Date: 12/15/2021   Patient Name: Robert Dixon  DOB: October 27, 1941  MRN: [id number]  Age / Sex: 81 y.o., male  PCP: [name], MD Referring Physician: Debbe Odea, MD  Reason for Consultation: Establishing goals of care  HPI/Patient Profile: 81 y.o. 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 He presented to the emergency department on 12/11/2021 with worsening shortness of breath and hypoxia.  CTA was negative for PE, but showed patchy infiltrates in RML, RLL, LUL, and LLL that have progressed since CT in December.  Admitted to [facility] with pneumonia and acute on chronic respiratory failure.   Clinical Assessment and Goals of Care: I have reviewed medical records including EPIC notes, labs and imaging, and went to see patient at bedside. He is alert and oriented with no acute complaints. He does endorse shortness of breath with minimal exertion. Noted that patient had an episode of decreased oxygen saturation earlier today and oxygen was temporarily increased to 10L, but is now down to 7L.   I spoke with his daughter-in-law Robert Dixon by phone to discuss diagnosis, prognosis, [place], EOL wishes, disposition, and options. I introduced Palliative Medicine as specialized medical care for people living with serious illness. It focuses on providing relief from the symptoms and stress of a serious illness.   We discussed a brief life review of the patient. 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 Prior to that he worked in [employer]. His  wife passed away 6 years ago. He has 2 sons, Robert Dixon and Robert Dixon. Robert Dixon is Scott's wife and has been designated as the family spokesperson.   Robert Dixon lives alone in his home in [place]. The family has created a schedule so that someone is there every day of the week to help with meals, groceries, housework, etc. As far as functional status, Robert Dixon reports a decline over the past few weeks. Robert Dixon is ambulatory, but his mobility is limited by shortness of breath. She reports that he struggles to walk to the bathroom, even though it is only about 6 steps from the recliner where he spends much of his time.   We discussed his current illness and what it means in the larger context of his ongoing co-morbidities.  Natural disease trajectory of advanced lung disease was discussed, emphasizing that it is a non-curable and progressive illness characterized by exacerbations. Discussed that it results in decreased functional status over time, as patients do not usually return to previous baseline after having an exacerbation.   Robert Dixon expresses concern regarding the possibility that his lung cancer has progressed. There was follow-up imaging scheduled for March. However, she has been in contact with his oncologist (Dr. Julien Nordmann) and they are working to get him in sooner. Discussed that if  imaging is suspicious for metastasis, it is unlikely Robert Dixon would be a candidate for treatment at this point.   I attempted to elicit values and goals of care important to the patient. Robert Dixon reports that the family is committed to keeping Robert Dixon at home.   The difference between aggressive medical intervention and comfort care was considered in light of the patient's goals of care. Brief discussion was had on the philosophy and benefits of hospice care. Discussed that it offers a holistic approach to care in the setting of end-stage illness/disease, and is about supporting the patient where they are allowing the natural course to occur.    Robert Dixon seems receptive to the concept of hospice and thinks Robert Dixon will be also. She shares that he has been making statements to family members over the last few weeks such as "I don't know why I'm still here". She also acknowledges that Robert Dixon is likely tired.    Primary decision maker: Patient with support from family    SUMMARY OF RECOMMENDATIONS   DNR/DNI as previously documented Goal of care is for patient to remain home Family meeting scheduled tomorrow at 12:30   Dixon Status/Advance Care Planning: DNR  Prognosis:  < 6 months would not be surprising  Discharge Planning: To Be Determined      Primary Diagnoses: Present on Admission:  CAP (community acquired pneumonia)  PNA (pneumonia)   I have reviewed the medical record, interviewed the patient and family, and examined the patient. The following aspects are pertinent.  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   Mediastinal adenopathy 12/06/2018   Migraine with visual aura    Non-small cell carcinoma of left lung, stage 3 ([place]) 12/19/2018   Non-small cell lung cancer ([place]) dx'd 11/20/18   Pericardial effusion 10/14/2019   Pneumonia 11/20/2018   PONV (postoperative nausea and vomiting)    Primary malignant neoplasm of left upper lobe of lung ([place]) 12/12/2018   Pulmonary nodule 11/20/2018   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    Severe aortic stenosis 10/14/2019   Skin cancer     Scheduled Meds:  aspirin  81 mg Oral Daily   atorvastatin  40 mg Oral Daily   citalopram  20 mg Oral Daily   enoxaparin (LOVENOX) injection  40 mg Subcutaneous Q24H   feeding supplement  237 mL Oral BID BM   fluticasone furoate-vilanterol  1 puff Inhalation Daily   furosemide  20 mg Oral Daily   metoprolol succinate  25 mg Oral Daily   multivitamin with minerals  1 tablet Oral Daily   pantoprazole  40 mg Oral BID   polycarbophil  625 mg Oral BID   tamsulosin  0.4 mg Oral QPC supper   umeclidinium bromide  1 puff Inhalation Daily   Continuous Infusions:  azithromycin 500 mg (12/15/21 1031)   cefTRIAXone (ROCEPHIN)  IV 2 g (12/15/21 0936)  PRN Meds:.acetaminophen, albuterol, alum & mag hydroxide-simeth   Allergies  Allergen Reactions   Hydrocodone Bit-Homatrop Mbr     Other reaction(s): dizzy but can take with food   Review of Systems  Respiratory:  Positive for shortness of breath.    Physical Exam Vitals reviewed.  Constitutional:      General: He is not in acute distress.    Comments: Chronically ill appearing  Pulmonary:     Effort: Pulmonary effort is normal.  Neurological:     Mental Status: He is alert and oriented to person, place, and time.    Vital Signs: BP 130/78 (BP Location: Left Arm)    Pulse 91    Temp 98.3 F (36.8 C)    Resp 17    Ht 5\' 8"  (1.727 m)    Wt 72.1 kg    SpO2 94%    BMI 24.18 kg/m  Pain Scale: 0-10   Pain Score: 0-No pain   SpO2: SpO2: 94 % O2 Device:SpO2: 94 %   IO: Intake/output summary:  Intake/Output Summary (Last 24 hours) at 12/15/2021 1151 Last data filed at 12/15/2021 6295 Gross per 24 hour  Intake 1892.58 ml  Output 950 ml  Net 942.58 ml    LBM: Last BM Date: 12/14/21 Baseline Weight: Weight: 72.1 kg Most recent weight: Weight: 72.1 kg      Palliative Assessment/Data: PPS 40%     MDM - High due to: 1 or more chronic illnesses with severe exacerbation,  progression, or side effects of treatment OR acute or chronic illness or injury that poses a threat to life or bodily function Review of prior external notes, review of test results, assessment requiring an independent historian Discussion or decision not to resuscitate or to de-escalate care because poor prognosis   Signed by: Lavena Bullion, NP   Please contact Palliative Medicine Team phone at [phone] for questions and concerns.  For individual provider: See Shea Evans

## 2021-12-16 DIAGNOSIS — J432 Centrilobular emphysema: Secondary | ICD-10-CM

## 2021-12-16 DIAGNOSIS — Z66 Do not resuscitate: Secondary | ICD-10-CM

## 2021-12-16 DIAGNOSIS — J8410 Pulmonary fibrosis, unspecified: Secondary | ICD-10-CM

## 2021-12-16 LAB — LEGIONELLA PNEUMOPHILA SEROGP 1 UR AG: L. pneumophila Serogp 1 Ur Ag: NEGATIVE

## 2021-12-16 LAB — BASIC METABOLIC PANEL
Anion gap: 8 (ref 5–15)
BUN: 14 mg/dL (ref 8–23)
CO2: 26 mmol/L (ref 22–32)
Calcium: 8.4 mg/dL — ABNORMAL LOW (ref 8.9–10.3)
Chloride: 109 mmol/L (ref 98–111)
Creatinine, Ser: 0.86 mg/dL (ref 0.61–1.24)
GFR, Estimated: >60 mL/min (ref >60)
Glucose, Bld: 103 mg/dL — ABNORMAL HIGH (ref 70–99)
Potassium: 3.6 mmol/L (ref 3.5–5.1)
Sodium: 143 mmol/L (ref 135–145)

## 2021-12-16 LAB — CULTURE, BLOOD (ROUTINE X 2)
Culture: NO GROWTH
Special Requests: ADEQUATE

## 2021-12-16 MED ORDER — LORAZEPAM 0.5 MG PO TABS
0.5000 mg | ORAL_TABLET | Freq: Three times a day (TID) | ORAL | Status: DC | PRN
  Administered 2021-12-16 – 2021-12-17 (×2): 1 mg via ORAL
  Filled 2021-12-16 (×2): qty 2

## 2021-12-16 MED ORDER — MORPHINE SULFATE 10 MG/5ML PO SOLN
5.0000 mg | ORAL | Status: DC | PRN
  Administered 2021-12-16 – 2021-12-17 (×2): 5 mg via SUBLINGUAL
  Filled 2021-12-16 (×2): qty 5

## 2021-12-16 NOTE — Progress Notes (Signed)
PT Cancellation Note  Patient Details Name: Robert Dixon MRN: [id number] DOB: 03-17-1941   Cancelled Treatment:    Reason Eval/Treat Not Completed: Patient declined, no reason specified. Pt declining PT services at this time, reporting he and family have decided to discharge home with hospice services. Pt reports he likely will decline PT services for the remainder of this admission, however is open to PT returning Monday to assess if he has changed his mind.   Zenaida Niece 12/16/2021, 3:19 PM

## 2021-12-16 NOTE — Progress Notes (Signed)
Daily Progress Note   Patient Name: Robert Dixon       Date: 12/16/2021 DOB: 1941-06-12  Age: 81 y.o. MRN#: [id number] Attending Physician: Debbe Odea, MD Primary Care Physician: [name], MD Admit Date: 12/11/2021  Reason for Consultation/Follow-up: Establishing goals of care  HPI/Patient Profile: 81 y.o. 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 He presented to the emergency department on 12/11/2021 with worsening shortness of breath and hypoxia.  CTA was negative for PE, but showed patchy infiltrates in RML, RLL, LUL, and LLL that have progressed since CT in December.  Admitted to [facility] with pneumonia and acute on chronic respiratory failure.   Subjective: Prior to meeting with the patient and family, I discussed case with his pulmonologist Dr.Icard, who confirmed that patient is not a candidate for a Zephyr valve.   I met at bedside with patient, son/Scott, and daughter-in-law [name] to discuss diagnosis, prognosis, GOC, EOL wishes, disposition, and options.  We discussed his current illness and what it means in the larger context of his ongoing co-morbidities.  Natural disease trajectory of advanced lung disease was discussed, emphasizing that it is a non-curable and progressive illness characterized by exacerbations. Discussed that it results in decreased functional status over time, as patients do not usually return to previous  baseline after having an exacerbation. Patient and family acknowledge that his mobility has been severely limited due to shortness of breath with even minimal exertion.   We also discussed that unfortunately due to progression of his lung disease, he is not a candidate for advanced treatment options such as a Zephyr valve.   I attempted to elicit values and goals of care important to the patient. The goal is for him to remain at home as long as possible, and the family fully supports this. Discussed the concept of a comfort path, emphasizing that this means allowing a natural course to occur. Discussed that the goal is comfort and dignity rather than cure/prolonging life.   Provided education and counseling at length on the philosophy and benefits of hospice  care. Discussed that it offers a holistic approach to care in the setting of end-stage illness/disease. Hospice can help a patient feel as good as possible for as long as possible. Discussed the hospice team includes RNs, physicians, social workers, and chaplains. They can provide personal care, symptom management, support for the family, and help keep patient out of the hospital.  Advanced directives, concepts specific to code status, artifical feeding and hydration, and rehospitalization were considered and discussed. MOST form was explained and discussed - blank copy left for patient and family to review. Also provided patient/family with an advanced directives packet to review.   Patient and family are accepting to shift focus of care to comfort and quality of life. They agree to home with hospice and state their preference for [facility]. Questions and concerns were addressed.      Objective:   Physical Exam Vitals reviewed.  Constitutional:      General: He is not in acute distress.    Comments: Chronically ill-appearing  Pulmonary:     Effort: Pulmonary effort is normal.  Neurological:     Mental Status: He is alert and oriented  to person, place, and time.            Vital Signs: BP (!) 141/71 (BP Location: Left Arm)    Pulse 98    Temp 98.2 F (36.8 C) (Oral)    Resp (!) 22    Ht '5\' 8"'  (1.727 m)    Wt 72.1 kg    SpO2 90%    BMI 24.18 kg/m  SpO2: SpO2: 90 % O2 Device: O2 Device: Nasal Cannula O2 Flow Rate: O2 Flow Rate (L/min): 6 L/min  Intake/output summary:  Intake/Output Summary (Last 24 hours) at 12/16/2021 2005 Last data filed at 12/16/2021 1500 Gross per 24 hour  Intake 397 ml  Output 650 ml  Net -253 ml   LBM: Last BM Date: 12/16/21 Baseline Weight: Weight: 72.1 kg Most recent weight: Weight: 72.1 kg       Palliative Assessment/Data: PPS 40%      Palliative Care Assessment & Plan   Assessment: - pneumonia - acute on chronic respiratory failure - pulmonary fibrosis - severe emphysema - history of stage IIIB non-small cell lung cancer  Recommendations/Plan: Discharge home with hospice once medically optimized - [facility] - TOC order placed Ativan 0.5-1 mg every 8 hours prn for anxiety Morphine concentrate solution 5 mg SL every 3 hours prn for pain or dyspnea PMT will continue to follow   Prognosis:  < 6 months  Discharge Planning: Home with Hospice  Care plan was discussed with Dr. Wynelle Cleveland, [name], [facility], bedside RN  Thank you for allowing the Palliative Medicine Team to assist in the care of this patient.   Total time: 65 minutes   Lavena Bullion, NP  Please contact Palliative Medicine Team phone at [phone] for questions and concerns.

## 2021-12-16 NOTE — Progress Notes (Addendum)
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 Presents for shortness of breath on mild exertion such as walking across his room.  He stated that he had aspiration pneumonia last month and was treated with 7 days of Augmentin. In the ED oxygen saturation was is in the 70s CTA revealed extensive patchy air bronchograms bilaterally new from 12/9 suggestive of progressive, recurrent multilobar pneumonia-underlying metastatic disease difficult to exclude -Chronic radiation fibrosis Patient was started on ceftriaxone and azithromycin in the ED.  Subjective: He continues to have a cough and is very short of breath with just ambulating to the bedside commode.    Assessment & Plan:   Principal Problem:   PNA (pneumonia)-community-acquired History of pulmonary fibrosis and COPD Acute on chronic respiratory failure - still very short of breath when ambulating - per RN he is coughing up a large amount of tan mucous - SLP eval reveals that he is a mild aspiration risk and regular diet with thin liquids is recommended which is ordered -Antibiotics were started on 1/15 for CAP - I have consulted PCCM for guidance. Have been recommended to do a short course of antibiotics (7 days recommended upon my discussion with Dr Valeta Harms on 1/19) and consult palliative care due to his poor overall prognosis- I feel he may be a candidate for hospice at home - patient and family have opted for hospice at home  Active Problems: Chronic combined systolic and diastolic heart failure - Echo in 04/21/2020 revealed an EF of  30 to 35% and indeterminate LV diastolic parameters -Repeat echo on 04/12/2021 revealed an EF of 50 to 55% and grade 1 diastolic dysfunction - continue oral Lasix, metoprolol  BPH - Continue Flomax  Macrocytic anemia - follow intermittently     DVT prophylaxis: Lovenox Code Status: DO NOT RESUSCITATE Level of Care: Level of care: Telemetry Medical Disposition Plan:  Status is: Inpatient  Remains inpatient appropriate because: arranging hospice at home- probable dc tomorrow after bed delivered  Consultants:  Pulmonary Palliative   Antimicrobials:  Anti-infectives (From admission, onward)    Start     Dose/Rate Route Frequency Ordered Stop   12/12/21 1000  cefTRIAXone (ROCEPHIN) 2 g in sodium chloride 0.9 % 100 mL IVPB        2 g 200 mL/hr over 30 Minutes Intravenous Every 24 hours 12/11/21 1405 12/16/21 1112   12/12/21 1000  azithromycin (ZITHROMAX) 500 mg in sodium chloride 0.9 % 250 mL IVPB        500 mg 250 mL/hr over 60 Minutes Intravenous Every 24 hours 12/11/21 1405 12/16/21 1246   12/11/21 1330  cefTRIAXone (ROCEPHIN) 1 g in sodium chloride 0.9 % 100 mL IVPB        1 g 200 mL/hr over 30 Minutes Intravenous  Once 12/11/21 1329 12/11/21 1626   12/11/21 1330  azithromycin (ZITHROMAX) 500 mg in sodium chloride 0.9 % 250 mL IVPB        500 mg 250 mL/hr over 60 Minutes Intravenous  Once 12/11/21 1329 12/11/21 1703        Objective: Vitals:  12/15/21 2100 12/15/21 2219 12/16/21 0446 12/16/21 0840  BP:   127/80 130/75  Pulse: 64 64 91 92  Resp: 18 18 18 18   Temp:   (!) 97.5 F (36.4 C) 97.8 F (36.6 C)  TempSrc:   Oral Oral  SpO2: 91% 91% 97% 92%  Weight:      Height:        Intake/Output Summary (Last 24 hours) at 12/16/2021 1443 Last data filed at 12/16/2021 1300 Gross per 24 hour  Intake 1047 ml  Output 875 ml  Net 172 ml    Filed Weights   12/11/21 0824  Weight: 72.1 kg    Examination: General exam: Appears comfortable  HEENT: PERRLA, oral  mucosa moist, no sclera icterus or thrush Respiratory system: very poor breath sounds Cardiovascular system: S1 & S2 heard, regular rate and rhythm Gastrointestinal system: Abdomen soft, non-tender, nondistended. Normal bowel sounds   Central nervous system: Alert and oriented. No focal neurological deficits. Extremities: No cyanosis, clubbing or edema Skin: No rashes or ulcers Psychiatry:  Mood & affect appropriate.      Data Reviewed: I have personally reviewed following labs and imaging studies  CBC: Recent Labs  Lab 12/11/21 0832 12/12/21 0413 12/13/21 0135 12/14/21 0302 12/15/21 0219  WBC 11.6* 7.6 5.7 5.5 5.4  NEUTROABS 10.6*  --  4.2 4.0 3.8  HGB 10.9* 9.0* 8.8* 9.2* 9.1*  HCT 32.7* 28.9* 28.1* 27.9* 28.8*  MCV 121.1* 125.1* 125.4* 120.8* 122.6*  PLT 238 199 199 213 212    Basic Metabolic Panel: Recent Labs  Lab 12/12/21 0413 12/13/21 0135 12/14/21 0302 12/15/21 0219 12/16/21 0403  NA 142 142 141 141 143  K 3.8 3.9 3.6 3.7 3.6  CL 111 108 107 109 109  CO2 24 23 26 25 26   GLUCOSE 130* 98 87 96 103*  BUN 23 22 18 15 14   CREATININE 1.20 1.14 0.90 0.99 0.86  CALCIUM 8.3* 8.2* 8.1* 8.0* 8.4*    GFR: Estimated Creatinine Clearance: 66.3 mL/min (by C-G formula based on SCr of 0.86 mg/dL). Liver Function Tests: Recent Labs  Lab 12/11/21 0832  AST 23  ALT 18  ALKPHOS 89  BILITOT 1.1  PROT 6.1*  ALBUMIN 3.2*    No results for input(s): LIPASE, AMYLASE in the last 168 hours. No results for input(s): AMMONIA in the last 168 hours. Coagulation Profile: No results for input(s): INR, PROTIME in the last 168 hours. Cardiac Enzymes: No results for input(s): CKTOTAL, CKMB, CKMBINDEX, TROPONINI in the last 168 hours. BNP (last 3 results) No results for input(s): PROBNP in the last 8760 hours. HbA1C: No results for input(s): HGBA1C in the last 72 hours. CBG: No results for input(s): GLUCAP in the last 168 hours. Lipid Profile: No results for input(s):  CHOL, HDL, LDLCALC, TRIG, CHOLHDL, LDLDIRECT in the last 72 hours. Thyroid Function Tests: No results for input(s): TSH, T4TOTAL, FREET4, T3FREE, THYROIDAB in the last 72 hours. Anemia Panel: No results for input(s): VITAMINB12, FOLATE, FERRITIN, TIBC, IRON, RETICCTPCT in the last 72 hours. Urine analysis:    Component Value Date/Time   COLORURINE YELLOW 03/24/2020 2219   APPEARANCEUR CLEAR 03/24/2020 2219   LABSPEC 1.014 03/24/2020 2219   PHURINE 6.0 03/24/2020 2219   GLUCOSEU NEGATIVE 03/24/2020 2219   HGBUR NEGATIVE 03/24/2020 2219   [place] NEGATIVE 03/24/2020 2219   KETONESUR NEGATIVE 03/24/2020 2219   PROTEINUR NEGATIVE 03/24/2020 2219   UROBILINOGEN 1.0 04/04/2013 1331   NITRITE NEGATIVE 03/24/2020 2219   LEUKOCYTESUR NEGATIVE 03/24/2020 2219  Sepsis Labs: @LABRCNTIP (procalcitonin:4,lacticidven:4) ) Recent Results (from the past 240 hour(s))  Resp Panel by RT-PCR (Flu A&B, Covid) Nasopharyngeal Swab     Status: None   Collection Time: 12/11/21  8:59 AM   Specimen: Nasopharyngeal Swab; Nasopharyngeal(NP) swabs in vial transport medium  Result Value Ref Range Status   SARS Coronavirus 2 by RT PCR NEGATIVE NEGATIVE Final    Comment: (NOTE) SARS-CoV-2 target nucleic acids are NOT DETECTED.  The SARS-CoV-2 RNA is generally detectable in upper respiratory specimens during the acute phase of infection. The lowest concentration of SARS-CoV-2 viral copies this assay can detect is 138 copies/mL. A negative result does not preclude SARS-Cov-2 infection and should not be used as the sole basis for treatment or other patient management decisions. A negative result may occur with  improper specimen collection/handling, submission of specimen other than nasopharyngeal swab, presence of viral mutation(s) within the areas targeted by this assay, and inadequate number of viral copies(<138 copies/mL). A negative result must be combined with clinical observations, patient history,  and epidemiological information. The expected result is Negative.  Fact Sheet for Patients:  EntrepreneurPulse.com.au  Fact Sheet for Healthcare Providers:  IncredibleEmployment.be  This test is no t yet approved or cleared by the [place] FDA and  has been authorized for detection and/or diagnosis of SARS-CoV-2 by FDA under an Emergency Use Authorization (EUA). This EUA will remain  in effect (meaning this test can be used) for the duration of the COVID-19 declaration under Section 564(b)(1) of the Act, 21 U.S.C.section 360bbb-3(b)(1), unless the authorization is terminated  or revoked sooner.       Influenza A by PCR NEGATIVE NEGATIVE Final   Influenza B by PCR NEGATIVE NEGATIVE Final    Comment: (NOTE) The Xpert Xpress SARS-CoV-2/FLU/RSV plus assay is intended as an aid in the diagnosis of influenza from Nasopharyngeal swab specimens and should not be used as a sole basis for treatment. Nasal washings and aspirates are unacceptable for Xpert Xpress SARS-CoV-2/FLU/RSV testing.  Fact Sheet for Patients: EntrepreneurPulse.com.au  Fact Sheet for Healthcare Providers: IncredibleEmployment.be  This test is not yet approved or cleared by the [place] FDA and has been authorized for detection and/or diagnosis of SARS-CoV-2 by FDA under an Emergency Use Authorization (EUA). This EUA will remain in effect (meaning this test can be used) for the duration of the COVID-19 declaration under Section 564(b)(1) of the Act, 21 U.S.C. section 360bbb-3(b)(1), unless the authorization is terminated or revoked.  Performed at [facility], [place] [address] [place], [place] [address]   Blood culture (routine x 2)     Status: Abnormal   Collection Time: 12/11/21  3:26 PM   Specimen: BLOOD LEFT HAND  Result Value Ref Range Status   Specimen Description BLOOD LEFT HAND  Final   Special Requests   Final     BOTTLES DRAWN AEROBIC AND ANAEROBIC Blood Culture adequate volume   Culture  Setup Time   Final    GRAM POSITIVE COCCI IN BOTH AEROBIC AND ANAEROBIC BOTTLES CRITICAL RESULT CALLED TO, READ BACK BY AND VERIFIED WITH: PHARMD GREG ABBOTT 12/12/21 @2321  BY JW    Culture (A)  Final    STAPHYLOCOCCUS CAPITIS THE SIGNIFICANCE OF ISOLATING THIS ORGANISM FROM A SINGLE SET OF BLOOD CULTURES WHEN MULTIPLE SETS ARE DRAWN IS UNCERTAIN. PLEASE NOTIFY THE MICROBIOLOGY DEPARTMENT WITHIN ONE WEEK IF SPECIATION AND SENSITIVITIES ARE REQUIRED. Performed at [facility], [place] [address], [place], [place] [address]    Report Status  12/14/2021 FINAL  Final  Blood Culture ID Panel (Reflexed)     Status: Abnormal   Collection Time: 12/11/21  3:26 PM  Result Value Ref Range Status   Enterococcus faecalis NOT DETECTED NOT DETECTED Final   Enterococcus Faecium NOT DETECTED NOT DETECTED Final   Listeria monocytogenes NOT DETECTED NOT DETECTED Final   Staphylococcus species DETECTED (A) NOT DETECTED Final    Comment: CRITICAL RESULT CALLED TO, READ BACK BY AND VERIFIED WITH: PHARMD GREG ABBOTT 12/12/21 @2321  BY JW    Staphylococcus aureus (BCID) NOT DETECTED NOT DETECTED Final   Staphylococcus epidermidis NOT DETECTED NOT DETECTED Final   Staphylococcus lugdunensis NOT DETECTED NOT DETECTED Final   Streptococcus species NOT DETECTED NOT DETECTED Final   Streptococcus agalactiae NOT DETECTED NOT DETECTED Final   Streptococcus pneumoniae NOT DETECTED NOT DETECTED Final   Streptococcus pyogenes NOT DETECTED NOT DETECTED Final   A.calcoaceticus-baumannii NOT DETECTED NOT DETECTED Final   Bacteroides fragilis NOT DETECTED NOT DETECTED Final   Enterobacterales NOT DETECTED NOT DETECTED Final   Enterobacter cloacae complex NOT DETECTED NOT DETECTED Final   Escherichia coli NOT DETECTED NOT DETECTED Final   Klebsiella aerogenes NOT DETECTED NOT DETECTED Final   Klebsiella oxytoca NOT DETECTED NOT DETECTED Final    Klebsiella pneumoniae NOT DETECTED NOT DETECTED Final   Proteus species NOT DETECTED NOT DETECTED Final   Salmonella species NOT DETECTED NOT DETECTED Final   Serratia marcescens NOT DETECTED NOT DETECTED Final   Haemophilus influenzae NOT DETECTED NOT DETECTED Final   Neisseria meningitidis NOT DETECTED NOT DETECTED Final   Pseudomonas aeruginosa NOT DETECTED NOT DETECTED Final   Stenotrophomonas maltophilia NOT DETECTED NOT DETECTED Final   Candida albicans NOT DETECTED NOT DETECTED Final   Candida auris NOT DETECTED NOT DETECTED Final   Candida glabrata NOT DETECTED NOT DETECTED Final   Candida krusei NOT DETECTED NOT DETECTED Final   Candida parapsilosis NOT DETECTED NOT DETECTED Final   Candida tropicalis NOT DETECTED NOT DETECTED Final   Cryptococcus neoformans/gattii NOT DETECTED NOT DETECTED Final    Comment: Performed at [facility], [address] [place], [place] [address]  Blood culture (routine x 2)     Status: None   Collection Time: 12/11/21  3:27 PM   Specimen: BLOOD  Result Value Ref Range Status   Specimen Description BLOOD LEFT ANTECUBITAL  Final   Special Requests   Final    BOTTLES DRAWN AEROBIC AND ANAEROBIC Blood Culture adequate volume   Culture   Final    NO GROWTH 5 DAYS Performed at [facility], [place] [address], [place], [place] [address]    Report Status 12/16/2021 FINAL  Final         Radiology Studies: No results found.    Scheduled Meds:  aspirin  81 mg Oral Daily   atorvastatin  40 mg Oral Daily   citalopram  20 mg Oral Daily   enoxaparin (LOVENOX) injection  40 mg Subcutaneous Q24H   feeding supplement  237 mL Oral BID BM   fluticasone furoate-vilanterol  1 puff Inhalation Daily   furosemide  20 mg Oral Daily   metoprolol succinate  25 mg Oral Daily   multivitamin with minerals  1 tablet Oral Daily   pantoprazole  40 mg Oral BID   polycarbophil  625 mg Oral BID   tamsulosin  0.4 mg Oral QPC supper   umeclidinium  bromide  1 puff Inhalation Daily   Continuous Infusions:  LOS: 5 days      Debbe Odea, MD [facility] Pager: [email or web address] 12/16/2021, 2:43 PM

## 2021-12-16 NOTE — TOC Progression Note (Signed)
Transition of Care [facility]) - Progression Note    Patient Details  Name: Robert Dixon MRN: [id number] Date of Birth: 23-Mar-1941  Transition of Care [facility]) CM/SW Contact  Tom-Johnson, Renea Ee, RN Phone Number: 12/16/2021, 5:13 PM  Clinical Narrative:     CM consulted for home with Hospice. Family chose [facility]. Referral made with Liaison, Shanita and acceptance voiced. Family requested hospital bed and it will be delivered before discharge. Transportation will be scheduled at discharge. CM will continue to follow with needs   Expected Discharge Plan: [place] Barriers to Discharge: Continued Medical Work up  Expected Discharge Plan and Services Expected Discharge Plan: [place]   Discharge Planning Services: CM Consult Post Acute Care Choice: [place] arrangements for the past 2 months: Single Family Home                           HH Arranged: PT, OT, Speech Therapy HH Agency: [facility] Date [place]: 12/14/21 Time [place]: 8588 Representative spoke with at [place]: [place] ([place]) Interventions    Readmission Risk Interventions Readmission Risk Prevention Plan 03/18/2020  Transportation Screening Complete  PCP or Specialist Appt within 3-5 Days Complete  HRI or [place] Complete  Social Work Consult for [place] Planning/Counseling Complete  Palliative Care Screening Not Applicable  Medication Review Press photographer) Complete  Some recent data might be hidden

## 2021-12-16 NOTE — Progress Notes (Signed)
Manufacturing engineer [facility]) Hospital Liaison: RN note    Notified by Transition of Care Manger of patient/family request for [facility] services at home after discharge. Chart and patient information under review by [facility] physician. Hospice eligibility pending currently.    Writer spoke with DIL, DeeAnna  to initiate education related to hospice philosophy, services and team approach to care.  DeeAnna  verbalized understanding of information given. Per discussion, plan is for discharge to home by PTAR.   Please send signed and completed DNR form home with patient/family. Patient will need prescriptions for discharge comfort medications.     DME needs have been discussed, patient currently has the following equipment in the home: oxygen, walker, W/C and 3N1.  Patient/family requests the following DME for delivery to the home:  hospital bed. [place] equipment manager has been notified and will contact DME provider to arrange delivery to the home. Home address has been verified and is correct in the chart. [name] is the family member to contact to arrange time of delivery.     [facility] aware of the above. Please notify [facility] when patient is ready to leave the unit at discharge. (Call [phone] or [phone] after 5pm.) [facility] information and contact numbers given to [facility].      A Please do not hesitate to call with questions.    Thank you,   Farrel Gordon, RN, [facility] Liaison   [phone]

## 2021-12-17 DIAGNOSIS — R0600 Dyspnea, unspecified: Secondary | ICD-10-CM

## 2021-12-17 MED ORDER — AZITHROMYCIN 500 MG PO TABS
500.0000 mg | ORAL_TABLET | Freq: Once | ORAL | Status: AC
  Administered 2021-12-17: 500 mg via ORAL
  Filled 2021-12-17: qty 1

## 2021-12-17 MED ORDER — LORAZEPAM 0.5 MG PO TABS
0.5000 mg | ORAL_TABLET | Freq: Four times a day (QID) | ORAL | 0 refills | Status: AC | PRN
Start: 2021-12-17 — End: ?

## 2021-12-17 MED ORDER — MORPHINE SULFATE (CONCENTRATE) 20 MG/ML PO SOLN: 5.0000 mg | ORAL | 0 refills | Status: AC | PRN

## 2021-12-17 MED ORDER — SODIUM CHLORIDE 0.9 % IV SOLN
2.0000 g | Freq: Once | INTRAVENOUS | Status: AC
  Administered 2021-12-17: 2 g via INTRAVENOUS
  Filled 2021-12-17: qty 20

## 2021-12-17 NOTE — Progress Notes (Signed)
Daily Progress Note   Patient Name: Robert Dixon       Date: 12/17/2021 DOB: 1941-04-16  Age: 81 y.o. MRN#: [id number] Attending Physician: Debbe Odea, MD Primary Care Physician: [name], MD Admit Date: 12/11/2021   HPI/Patient Profile: 81 y.o. 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 He presented to the emergency department on 12/11/2021 with worsening shortness of breath and hypoxia.  CTA was negative for PE, but showed patchy infiltrates in RML, RLL, LUL, and LLL that have progressed since CT in December.  Admitted to [facility] with pneumonia and acute on chronic respiratory failure.   Subjective: Chart reviewed. Per MAR, patient received 1 mg po Ativan at 20:49 and 5 mg morphine solution at 19:40 last night.   I went to see patient at bedside. He is alert and oriented, sitting up on the edge of the bed. I asked him if the ativan and morphine given last night helped with his work of breathing, and he states that it did. He would like to continue this medication at home. Discussed that plan was for discharge home with hospice this afternoon.  I spoke with daughter-in-law Robert Dixon by phone. We discussed continuing prn morphine and ativan at home to help manage Robert Dixon's symptoms of anxiety and dyspnea. She tells me that Robert Dixon told her that he slept all night and that he felt "good" this morning. She shared that he usually reports "not feeling so good".  She is appreciative that this medication can help him feel better and hopefully improve his quality of life. I let her know I would be sending an Rx for these medications to their pharmacy on file if they had it in stock. Robert Dixon expresses appreciation for PMT support.   I called Robert Dixon back to let her know the Rx had been sent to [facility] on [address] instead as they had it in stock.    Objective:   Physical Exam Vitals reviewed.  Constitutional:      General: He is not in acute distress.    Comments: Chronically ill-appearing  Pulmonary:     Effort: Pulmonary effort is normal.  Neurological:     Mental Status: He is alert and oriented to person, place, and time.            Vital Signs: BP (!) 107/57 (BP Location: Left Arm)    Pulse 96    Temp 98 F (36.7 C) (Oral)    Resp 18    Ht 5\' 8"  (1.727 m)    Wt 72.1 kg    SpO2 91%    BMI 24.18 kg/m  SpO2: SpO2: 91 % O2 Device: O2 Device: Nasal Cannula O2 Flow Rate: O2 Flow Rate (L/min): 6 L/min  Intake/output summary:  Intake/Output Summary (Last 24 hours) at 12/17/2021 1454 Last data filed at 12/17/2021 1300 Gross per 24 hour  Intake 360 ml  Output 600 ml  Net -240 ml   LBM: Last BM Date: 12/16/21 Baseline Weight: Weight: 72.1 kg Most recent weight: Weight: 72.1 kg       Palliative Assessment/Data: PPS 40%      Palliative Care Assessment & Plan   Assessment: - pneumonia - acute on chronic respiratory failure - pulmonary fibrosis - severe emphysema - history of stage IIIB non-small cell lung cancer   Recommendations/Plan: Pending discharge home today with hospice Rx sent for Ativan 0.5-1 mg every 8 hours prn for anxiety, quantity-30, refills-0 Rx sent for Morphine concentrate solution 5 mg SL every 3 hours prn for pain or dyspnea, quanity-30 ml, refills-0  Goals of Care and Additional Recommendations: Limitations on Scope of Treatment: Avoid Hospitalization  Dixon Status: DNR/DNI  Prognosis:  < 6  months  Discharge Planning: Home with Hospice   Thank you for allowing the Palliative Medicine Team to assist in the care of this patient.  MDM - High due to: 1 or more chronic illnesses with severe exacerbation, progression, or side effects of treatment OR acute or chronic illness or injury that poses a threat to life or bodily function Review of prior external notes, review of test results, assessment requiring an independent historian Discussion or decision not to resuscitate or to de-escalate care because poor prognosis   Lavena Bullion, NP  Please contact Palliative Medicine Team phone at [phone] for questions and concerns.

## 2021-12-17 NOTE — Plan of Care (Signed)
°  Problem: Respiratory: °Goal: Ability to maintain adequate ventilation will improve °Outcome: Progressing °  °

## 2021-12-17 NOTE — Discharge Summary (Signed)
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 Presents for shortness of breath on mild exertion such as walking across his room.  He stated that he had aspiration pneumonia last month and was treated with 7 days of Augmentin. In the ED oxygen saturation was is in the 70s CTA revealed extensive patchy air bronchograms bilaterally new from 12/9 suggestive of progressive, recurrent multilobar pneumonia-underlying metastatic disease difficult to exclude -Chronic radiation fibrosis Patient was started on ceftriaxone and azithromycin in the ED.  Hospital Course:  Principal Problem:   PNA (pneumonia)-community-acquired History of pulmonary fibrosis and COPD Acute on chronic respiratory failure - I have consulted PCCM for guidance. Have been recommended to do a short course of antibiotics and consult palliative care due to his poor overall prognosis-  - patient and family have opted for hospice at home     Active Problems: Chronic combined systolic and diastolic heart failure - Echo in 04/21/2020 revealed an EF of 30 to 35% and indeterminate LV diastolic parameters -Repeat echo on 04/12/2021 revealed an EF of 50 to 55% and grade 1 diastolic dysfunction - continue oral Lasix, metoprolol   BPH -  Continue Flomax   Macrocytic anemia       Discharge Exam: Vitals:   12/17/21 0903 12/17/21 0905  BP: (!) 107/57   Pulse: 97 96  Resp: 18   Temp:  98 F (36.7 C)  SpO2: (!) 88% 91%   Vitals:   12/16/21 2038 12/17/21 0513 12/17/21 0903 12/17/21 0905  BP: 121/69 136/69 (!) 107/57   Pulse: 94 93 97 96  Resp: 20 20 18    Temp: 98.2 F (36.8 C) 98.4 F (36.9 C)  98 F (36.7 C)  TempSrc: Oral Oral  Oral  SpO2: 94% 94% (!) 88% 91%  Weight:      Height:        General: Pt is alert, awake, not in acute distress Cardiovascular: RRR, S1/S2 +, no rubs, no gallops Respiratory: CTA bilaterally, no wheezing, no rhonchi Abdominal: Soft, NT, ND, bowel sounds + Extremities: no edema, no cyanosis   Discharge Instructions   Allergies as of 12/17/2021       Reactions   Hydrocodone Bit-homatrop Mbr    Other reaction(s): dizzy but can take with food        Medication List     STOP taking these medications    atorvastatin 40 MG tablet Commonly known as: LIPITOR       TAKE these medications    acetaminophen 325 MG tablet Commonly known as: TYLENOL Take 2 tablets (650 mg total) by mouth every 6 (six) hours as needed for mild pain (or Fever >/= 101).   albuterol (2.5 MG/3ML) 0.083% nebulizer solution Commonly known as: PROVENTIL Take 3 mLs (2.5 mg total) by nebulization every  4 (four) hours. What changed:  when to take this reasons to take this   albuterol 108 (90 Base) MCG/ACT inhaler Commonly known as: VENTOLIN HFA Inhale 2 puffs into the lungs every 6 (six) hours as needed for wheezing or shortness of breath. What changed: Another medication with the same name was changed. Make sure you understand how and when to take each.   alum & mag hydroxide-simeth 200-200-20 MG/5ML suspension Commonly known as: MAALOX/MYLANTA Take 15 mLs by mouth every 6 (six) hours as needed for indigestion or heartburn.   ascorbic acid 500 MG tablet Commonly known as: VITAMIN C Take  500 mg by mouth daily.   aspirin 81 MG chewable tablet Chew 1 tablet (81 mg total) by mouth daily.   citalopram 20 MG tablet Commonly known as: CELEXA Take 20 mg by mouth daily.   furosemide 20 MG tablet Commonly known as: LASIX TAKE 1 TABLET (20 MG TOTAL) BY MOUTH DAILY.   ketoconazole 2 % cream Commonly known as: NIZORAL Apply 1 application topically daily as needed (Skin rash).   LORazepam 0.5 MG tablet Commonly known as: ATIVAN Take 1-2 tablets (0.5-1 mg total) by mouth every 6 (six) hours as needed for anxiety or sleep.   metoprolol succinate 25 MG 24 hr tablet Commonly known as: TOPROL-XL TAKE 1 TABLET EVERY DAY What changed: how to take this   morphine 20 MG/ML concentrated solution Commonly known as: ROXANOL Place 0.25 mLs (5 mg total) under the tongue every 3 (three) hours as needed for severe pain, moderate pain or shortness of breath.   multivitamin tablet Take 1 tablet by mouth daily.   pantoprazole 40 MG tablet Commonly known as: PROTONIX Take 40 mg by mouth 2 (two) times daily.   Pfizer COVID-19 Vac Bivalent injection Generic drug: COVID-19 mRNA bivalent vaccine Therapist, music) Inject into the muscle.   polycarbophil 625 MG tablet Commonly known as: FIBERCON Take 625 mg by mouth 2 (two) times daily.   silodosin 8 MG Caps capsule Commonly known as: RAPAFLO Take 8 mg by mouth daily.   Trelegy Ellipta 100-62.5-25 MCG/ACT Aepb Generic drug: Fluticasone-Umeclidin-Vilant Inhale 1 puff into the lungs daily. What changed: Another medication with the same name was removed. Continue taking this medication, and follow the directions you see here.   VITAMIN B 12 PO Take 1 capsule by mouth daily.        [place] Follow [facility].   Specialty: Home Health Services Why: For home health services. They will call you in 1-2 days to set up your first home visit Contact information: [place] [address] [phone]                Allergies  Allergen Reactions   Hydrocodone Bit-Homatrop Mbr     Other reaction(s): dizzy but can take with food      DG Chest 2 View  Result Date: 12/11/2021 CLINICAL DATA:  Worsening dyspnea.  COPD.  Lung carcinoma. EXAM: CHEST - 2 VIEW COMPARISON:  08/10/2021 FINDINGS: Right-sided power port remains in appropriate position. Heart size is stable. Prior TAVR again noted. Moderate left pleural thickening or fluid and left lower lobe atelectasis or scarring remains stable. Small right pleural effusion or pleural thickening also unchanged. Marked pulmonary hyperinflation and diffuse interstitial prominence is unchanged and consistent with COPD. No new or worsening areas of pulmonary opacity are seen. IMPRESSION: No acute findings. Stable appearance of moderate left  pleural thickening or effusion, and left lower lobe atelectasis or scarring. COPD. Electronically Signed   By: Marlaine Hind M.D.   On: 12/11/2021 10:01   CT Angio Chest PE W and/or Wo Contrast  Result Date: 12/11/2021 CLINICAL DATA:  Dyspnea. COPD. Pulmonary embolism (PE) suspected, high prob Non-small cell left lung cancer diagnosed 2020. EXAM: CT ANGIOGRAPHY CHEST WITH CONTRAST TECHNIQUE: Multidetector CT imaging of the chest was performed using the standard protocol during bolus administration of intravenous contrast. Multiplanar CT image reconstructions and MIPs were obtained to evaluate the vascular anatomy. RADIATION DOSE REDUCTION: This exam was performed according to the departmental dose-optimization program which includes automated exposure control, adjustment of the mA and/or kV according to patient size and/or use of iterative reconstruction technique. CONTRAST:  167mL OMNIPAQUE IOHEXOL 350 MG/ML SOLN COMPARISON:  11/04/2021 chest CT. Chest radiograph from earlier today. FINDINGS: Cardiovascular: The study is high quality for the evaluation of pulmonary embolism. There  are no filling defects in the central, lobar, segmental or subsegmental pulmonary artery branches to suggest acute pulmonary embolism. 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 Atherosclerotic nonaneurysmal thoracic aorta. Normal caliber pulmonary arteries. Top-normal heart size. Small to moderate pericardial effusion, similar. Left anterior descending and right coronary atherosclerosis. Mediastinum/Nodes: No discrete thyroid nodules. Unremarkable esophagus. No pathologically enlarged axillary, mediastinal or hilar lymph nodes. Lungs/Pleura: No pneumothorax. A chronic loculated small to moderate left pleural effusion with smooth mild left pleural thickening, not appreciably changed. Small dependent right pleural effusion is mildly increased from prior CT. Severe centrilobular emphysema. Extensive patchy and nodular consolidation with air bronchograms in inferior right middle lobe and throughout dependent right lower lobe, increased from 11/04/2021 CT. Sharply marginated left lower perihilar lung consolidation with associated volume loss, bronchiectasis and distortion, compatible with radiation fibrosis, unchanged. Additional mild patchy and indistinct nodular foci of consolidation in the peripheral left upper lobe (series 6/image 50), increased from 11/04/2021, for example measuring 0.8 cm (series 6/image 50), previously 0.6 cm. New mild patchy consolidation in the posterior superior segment left lower lobe (series 6/image 69). Upper abdomen: Small hiatal hernia. Hypodense 1.0 cm left liver lesion (series 5/image 129), stable since 12/11/2018 PET-CT, considered benign. Exophytic hypodense 1.7 cm upper left renal cortical lesion (series 5/image 134), unchanged since 12/11/2018 PET-CT. Musculoskeletal: No aggressive appearing focal osseous lesions. Severe chronic T7 vertebral compression fracture. Mild thoracic spondylosis. Review of the MIP images confirms the above findings. IMPRESSION: 1. No pulmonary embolism. 2.  Extensive patchy and indistinct nodular consolidation with air bronchograms in the inferior right middle lobe, dependent right lower lobe, peripheral left upper lobe and superior segment left lower lobe, new/increased from 11/04/2021 CT. While most likely progressive/recurrent multilobar pneumonia, underlying metastatic disease is difficult to exclude and close post treatment chest CT follow-up advised. 3. 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 4. Small dependent right pleural effusion is mildly increased. 5. Stable small to moderate pericardial effusion. 6. Two-vessel coronary atherosclerosis. 7. Small hiatal hernia. 8. Aortic Atherosclerosis (ICD10-I70.0) and Emphysema (ICD10-J43.9). Electronically Signed   By: Ilona Sorrel M.D.   On: 12/11/2021 13:13   VAS [place] ABI WITH/WO TBI  Result Date: 12/06/2021  LOWER EXTREMITY DOPPLER STUDY Patient Name:  Robert Dixon  Date of Exam:   12/06/2021 Medical Rec #: [id number]       Accession #:    [id number] Date of Birth: [birth year]  Patient Gender: M Patient Age:   44 years Exam Location:  [facility] Procedure:      VAS [place] ABI WITH/WO TBI Referring Phys: Jamelle Haring --------------------------------------------------------------------------------  Indications: Left profunda femoral artery stenosis by duplex at [facility]              Radiology on 12/01/21.  Performing Technologist: Ralene Cork RVT  Examination Guidelines: A complete evaluation includes at minimum, Doppler waveform signals and systolic blood pressure reading at the level of bilateral brachial, anterior tibial, and posterior tibial arteries, when vessel segments are accessible. Bilateral testing is considered an integral part of a complete examination. Photoelectric Plethysmograph (PPG) waveforms and toe systolic pressure readings  are included as required and additional duplex testing as needed. Limited examinations for reoccurring indications may be performed as noted.  ABI Findings: +---------+------------------+-----+--------+--------+  Right     Rt Pressure (mmHg) Index Waveform Comment   +---------+------------------+-----+--------+--------+  Brachial  120                                         +---------+------------------+-----+--------+--------+  PTA       136                1.11  biphasic           +---------+------------------+-----+--------+--------+  DP        144                1.18  biphasic           +---------+------------------+-----+--------+--------+  Great Toe 103                0.84                     +---------+------------------+-----+--------+--------+ +---------+------------------+-----+--------+-------+  Left      Lt Pressure (mmHg) Index Waveform Comment  +---------+------------------+-----+--------+-------+  Brachial  122                                        +---------+------------------+-----+--------+-------+  PTA       135                1.11  biphasic          +---------+------------------+-----+--------+-------+  DP        135                1.11  biphasic          +---------+------------------+-----+--------+-------+  Great Toe 108                0.89                    +---------+------------------+-----+--------+-------+ +-------+-----------+-----------+------------+------------+  ABI/TBI Today's ABI Today's TBI Previous ABI Previous TBI  +-------+-----------+-----------+------------+------------+  Right   1.18        0.84                                   +-------+-----------+-----------+------------+------------+  Left    1.11        0.89                                   +-------+-----------+-----------+------------+------------+  No previous ABI.  Summary: Right: Resting right ankle-brachial index is within normal range. No evidence of significant right lower extremity arterial disease. The right  toe-brachial index is normal. Left: Resting left ankle-brachial index is within normal range. No evidence of significant left lower extremity arterial disease. The left toe-brachial index is normal.  *See table(s) above for measurements and observations.  Electronically signed by Jamelle Haring on 12/06/2021 at 4:43:45 PM.    Final      The results of significant diagnostics from this hospitalization (including imaging, microbiology, ancillary and laboratory) are listed below for reference.     Microbiology: Recent Results (from the past 240 hour(s))  Resp Panel by RT-PCR (Flu A&B, Covid) Nasopharyngeal Swab     Status: None   Collection Time: 12/11/21  8:59 AM   Specimen: Nasopharyngeal Swab; Nasopharyngeal(NP) swabs in vial transport medium  Result Value Ref Range Status   SARS Coronavirus 2 by RT PCR NEGATIVE NEGATIVE Final    Comment: (NOTE) SARS-CoV-2 target nucleic acids are NOT DETECTED.  The SARS-CoV-2 RNA is generally detectable in upper respiratory specimens during the acute phase of infection. The lowest concentration of SARS-CoV-2 viral copies this assay can detect is 138 copies/mL. A negative result does not preclude SARS-Cov-2 infection and should not be used as the sole basis for treatment or other patient management decisions. A negative result may occur with  improper specimen collection/handling, submission of specimen other than nasopharyngeal swab, presence of viral mutation(s) within the areas targeted by this assay, and inadequate number of viral copies(<138 copies/mL). A negative result must be combined with clinical observations, patient history, and epidemiological information. The expected result is Negative.  Fact Sheet for Patients:  EntrepreneurPulse.com.au  Fact Sheet for Healthcare Providers:  IncredibleEmployment.be  This test is no t yet approved or cleared by the [place] FDA and  has been authorized for  detection and/or diagnosis of SARS-CoV-2 by FDA under an Emergency Use Authorization (EUA). This EUA will remain  in effect (meaning this test can be used) for the duration of the COVID-19 declaration under Section 564(b)(1) of the Act, 21 U.S.C.section 360bbb-3(b)(1), unless the authorization is terminated  or revoked sooner.       Influenza A by PCR NEGATIVE NEGATIVE Final   Influenza B by PCR NEGATIVE NEGATIVE Final    Comment: (NOTE) The Xpert Xpress SARS-CoV-2/FLU/RSV plus assay is intended as an aid in the diagnosis of influenza from Nasopharyngeal swab specimens and should not be used as a sole basis for treatment. Nasal washings and aspirates are unacceptable for Xpert Xpress SARS-CoV-2/FLU/RSV testing.  Fact Sheet for Patients: EntrepreneurPulse.com.au  Fact Sheet for Healthcare Providers: IncredibleEmployment.be  This test is not yet approved or cleared by the [place] FDA and has been authorized for detection and/or diagnosis of SARS-CoV-2 by FDA under an Emergency Use Authorization (EUA). This EUA will remain in effect (meaning this test can be used) for the duration of the COVID-19 declaration under Section 564(b)(1) of the Act, 21 U.S.C. section 360bbb-3(b)(1), unless the authorization is terminated or revoked.  Performed at [facility], [address] [place] [address] [place], [place] [address]   Blood culture (routine x 2)     Status: Abnormal   Collection Time: 12/11/21  3:26 PM   Specimen: BLOOD LEFT HAND  Result Value Ref Range Status   Specimen Description BLOOD LEFT HAND  Final   Special Requests   Final    BOTTLES DRAWN AEROBIC AND ANAEROBIC Blood Culture adequate volume  Culture  Setup Time   Final    GRAM POSITIVE COCCI IN BOTH AEROBIC AND ANAEROBIC BOTTLES CRITICAL RESULT CALLED TO, READ BACK BY AND VERIFIED WITH: PHARMD GREG ABBOTT 12/12/21 @2321  BY JW    Culture (A)  Final    STAPHYLOCOCCUS CAPITIS THE  SIGNIFICANCE OF ISOLATING THIS ORGANISM FROM A SINGLE SET OF BLOOD CULTURES WHEN MULTIPLE SETS ARE DRAWN IS UNCERTAIN. PLEASE NOTIFY THE MICROBIOLOGY DEPARTMENT WITHIN ONE WEEK IF SPECIATION AND SENSITIVITIES ARE REQUIRED. Performed at [facility], [place] [address] [place], [place] [address]    Report Status 12/14/2021 FINAL  Final  Blood Culture ID Panel (Reflexed)     Status: Abnormal   Collection Time: 12/11/21  3:26 PM  Result Value Ref Range Status   Enterococcus faecalis NOT DETECTED NOT DETECTED Final   Enterococcus Faecium NOT DETECTED NOT DETECTED Final   Listeria monocytogenes NOT DETECTED NOT DETECTED Final   Staphylococcus species DETECTED (A) NOT DETECTED Final    Comment: CRITICAL RESULT CALLED TO, READ BACK BY AND VERIFIED WITH: PHARMD GREG ABBOTT 12/12/21 @2321  BY JW    Staphylococcus aureus (BCID) NOT DETECTED NOT DETECTED Final   Staphylococcus epidermidis NOT DETECTED NOT DETECTED Final   Staphylococcus lugdunensis NOT DETECTED NOT DETECTED Final   Streptococcus species NOT DETECTED NOT DETECTED Final   Streptococcus agalactiae NOT DETECTED NOT DETECTED Final   Streptococcus pneumoniae NOT DETECTED NOT DETECTED Final   Streptococcus pyogenes NOT DETECTED NOT DETECTED Final   A.calcoaceticus-baumannii NOT DETECTED NOT DETECTED Final   Bacteroides fragilis NOT DETECTED NOT DETECTED Final   Enterobacterales NOT DETECTED NOT DETECTED Final   Enterobacter cloacae complex NOT DETECTED NOT DETECTED Final   Escherichia coli NOT DETECTED NOT DETECTED Final   Klebsiella aerogenes NOT DETECTED NOT DETECTED Final   Klebsiella oxytoca NOT DETECTED NOT DETECTED Final   Klebsiella pneumoniae NOT DETECTED NOT DETECTED Final   Proteus species NOT DETECTED NOT DETECTED Final   Salmonella species NOT DETECTED NOT DETECTED Final   Serratia marcescens NOT DETECTED NOT DETECTED Final   Haemophilus influenzae NOT DETECTED NOT DETECTED Final   Neisseria meningitidis NOT DETECTED NOT  DETECTED Final   Pseudomonas aeruginosa NOT DETECTED NOT DETECTED Final   Stenotrophomonas maltophilia NOT DETECTED NOT DETECTED Final   Candida albicans NOT DETECTED NOT DETECTED Final   Candida auris NOT DETECTED NOT DETECTED Final   Candida glabrata NOT DETECTED NOT DETECTED Final   Candida krusei NOT DETECTED NOT DETECTED Final   Candida parapsilosis NOT DETECTED NOT DETECTED Final   Candida tropicalis NOT DETECTED NOT DETECTED Final   Cryptococcus neoformans/gattii NOT DETECTED NOT DETECTED Final    Comment: Performed at [facility], [address] [place], [place] [address]  Blood culture (routine x 2)     Status: None   Collection Time: 12/11/21  3:27 PM   Specimen: BLOOD  Result Value Ref Range Status   Specimen Description BLOOD LEFT ANTECUBITAL  Final   Special Requests   Final    BOTTLES DRAWN AEROBIC AND ANAEROBIC Blood Culture adequate volume   Culture   Final    NO GROWTH 5 DAYS Performed at [facility], [place] [address], [place], [place] [address]    Report Status 12/16/2021 FINAL  Final     Labs: BNP (last 3 results) Recent Labs    12/11/21 0832  BNP 235.3*   Basic Metabolic Panel: Recent Labs  Lab 12/12/21 0413 12/13/21 0135 12/14/21 0302 12/15/21 0219 12/16/21 0403  NA 142 142 141 141 143  K 3.8 3.9 3.6 3.7 3.6  CL 111 108 107 109 109  CO2 24 23 26 25 26   GLUCOSE 130* 98 87 96 103*  BUN 23 22 18 15 14   CREATININE 1.20 1.14 0.90 0.99 0.86  CALCIUM 8.3* 8.2* 8.1* 8.0* 8.4*   Liver Function Tests: Recent Labs  Lab 12/11/21 0832  AST 23  ALT 18  ALKPHOS 89  BILITOT 1.1  PROT 6.1*  ALBUMIN 3.2*   No results for input(s): LIPASE, AMYLASE in the last 168 hours. No results for input(s): AMMONIA in the last 168 hours. CBC: Recent Labs  Lab 12/11/21 0832 12/12/21 0413 12/13/21 0135 12/14/21 0302 12/15/21 0219  WBC 11.6* 7.6 5.7 5.5 5.4  NEUTROABS 10.6*  --  4.2 4.0 3.8  HGB 10.9* 9.0* 8.8* 9.2* 9.1*  HCT 32.7* 28.9* 28.1*  27.9* 28.8*  MCV 121.1* 125.1* 125.4* 120.8* 122.6*  PLT 238 199 199 213 194   Cardiac Enzymes: No results for input(s): CKTOTAL, CKMB, CKMBINDEX, TROPONINI in the last 168 hours. BNP: Invalid input(s): POCBNP CBG: No results for input(s): GLUCAP in the last 168 hours. D-Dimer No results for input(s): DDIMER in the last 72 hours. Hgb A1c No results for input(s): HGBA1C in the last 72 hours. Lipid Profile No results for input(s): CHOL, HDL, LDLCALC, TRIG, CHOLHDL, LDLDIRECT in the last 72 hours. Thyroid function studies No results for input(s): TSH, T4TOTAL, T3FREE, THYROIDAB in the last 72 hours.  Invalid input(s): FREET3 Anemia work up No results for input(s): VITAMINB12, FOLATE, FERRITIN, TIBC, IRON, RETICCTPCT in the last 72 hours. Urinalysis    Component Value Date/Time   COLORURINE YELLOW 03/24/2020 2219   APPEARANCEUR CLEAR 03/24/2020 2219   LABSPEC 1.014 03/24/2020 2219   PHURINE 6.0 03/24/2020 2219   GLUCOSEU NEGATIVE 03/24/2020 2219   HGBUR NEGATIVE 03/24/2020 2219   BILIRUBINUR NEGATIVE 03/24/2020 2219   KETONESUR NEGATIVE 03/24/2020 2219   PROTEINUR NEGATIVE 03/24/2020 2219   UROBILINOGEN 1.0 04/04/2013 1331   NITRITE NEGATIVE 03/24/2020 2219   LEUKOCYTESUR NEGATIVE 03/24/2020 2219   Sepsis Labs Invalid input(s): PROCALCITONIN,  WBC,  LACTICIDVEN Microbiology Recent Results (from the past 240 hour(s))  Resp Panel by RT-PCR (Flu A&B, Covid) Nasopharyngeal Swab     Status: None   Collection Time: 12/11/21  8:59 AM   Specimen: Nasopharyngeal Swab; Nasopharyngeal(NP) swabs in vial transport medium  Result Value Ref Range Status   SARS Coronavirus 2 by RT PCR NEGATIVE NEGATIVE Final    Comment: (NOTE) SARS-CoV-2 target nucleic acids are NOT DETECTED.  The SARS-CoV-2 RNA is generally detectable in upper respiratory specimens during the acute phase of infection. The lowest concentration of SARS-CoV-2 viral copies this assay can detect is 138 copies/mL. A  negative result does not preclude SARS-Cov-2 infection and should not be used as the sole basis for treatment or other patient management decisions. A negative result may occur with  improper specimen collection/handling, submission of specimen other than nasopharyngeal swab, presence of viral mutation(s) within the areas targeted by this assay, and inadequate number of viral copies(<138 copies/mL). A negative result must be combined with clinical observations, patient history, and epidemiological information. The expected result is Negative.  Fact Sheet for Patients:  EntrepreneurPulse.com.au  Fact Sheet for Healthcare Providers:  IncredibleEmployment.be  This test is no t yet approved or cleared by the [place] FDA and  has been authorized for detection and/or diagnosis of SARS-CoV-2 by FDA under an Emergency Use Authorization (EUA). This EUA  will remain  in effect (meaning this test can be used) for the duration of the COVID-19 declaration under Section 564(b)(1) of the Act, 21 U.S.C.section 360bbb-3(b)(1), unless the authorization is terminated  or revoked sooner.       Influenza A by PCR NEGATIVE NEGATIVE Final   Influenza B by PCR NEGATIVE NEGATIVE Final    Comment: (NOTE) The Xpert Xpress SARS-CoV-2/FLU/RSV plus assay is intended as an aid in the diagnosis of influenza from Nasopharyngeal swab specimens and should not be used as a sole basis for treatment. Nasal washings and aspirates are unacceptable for Xpert Xpress SARS-CoV-2/FLU/RSV testing.  Fact Sheet for Patients: EntrepreneurPulse.com.au  Fact Sheet for Healthcare Providers: IncredibleEmployment.be  This test is not yet approved or cleared by the [place] FDA and has been authorized for detection and/or diagnosis of SARS-CoV-2 by FDA under an Emergency Use Authorization (EUA). This EUA will remain in effect (meaning this test can  be used) for the duration of the COVID-19 declaration under Section 564(b)(1) of the Act, 21 U.S.C. section 360bbb-3(b)(1), unless the authorization is terminated or revoked.  Performed at [facility], [place] [address] [place], [place] [address]   Blood culture (routine x 2)     Status: Abnormal   Collection Time: 12/11/21  3:26 PM   Specimen: BLOOD LEFT HAND  Result Value Ref Range Status   Specimen Description BLOOD LEFT HAND  Final   Special Requests   Final    BOTTLES DRAWN AEROBIC AND ANAEROBIC Blood Culture adequate volume   Culture  Setup Time   Final    GRAM POSITIVE COCCI IN BOTH AEROBIC AND ANAEROBIC BOTTLES CRITICAL RESULT CALLED TO, READ BACK BY AND VERIFIED WITH: PHARMD GREG ABBOTT 12/12/21 @2321  BY JW    Culture (A)  Final    STAPHYLOCOCCUS CAPITIS THE SIGNIFICANCE OF ISOLATING THIS ORGANISM FROM A SINGLE SET OF BLOOD CULTURES WHEN MULTIPLE SETS ARE DRAWN IS UNCERTAIN. PLEASE NOTIFY THE MICROBIOLOGY DEPARTMENT WITHIN ONE WEEK IF SPECIATION AND SENSITIVITIES ARE REQUIRED. Performed at [facility], [place] [address], [place], [place] [address]    Report Status 12/14/2021 FINAL  Final  Blood Culture ID Panel (Reflexed)     Status: Abnormal   Collection Time: 12/11/21  3:26 PM  Result Value Ref Range Status   Enterococcus faecalis NOT DETECTED NOT DETECTED Final   Enterococcus Faecium NOT DETECTED NOT DETECTED Final   Listeria monocytogenes NOT DETECTED NOT DETECTED Final   Staphylococcus species DETECTED (A) NOT DETECTED Final    Comment: CRITICAL RESULT CALLED TO, READ BACK BY AND VERIFIED WITH: PHARMD GREG ABBOTT 12/12/21 @2321  BY JW    Staphylococcus aureus (BCID) NOT DETECTED NOT DETECTED Final   Staphylococcus epidermidis NOT DETECTED NOT DETECTED Final   Staphylococcus lugdunensis NOT DETECTED NOT DETECTED Final   Streptococcus species NOT DETECTED NOT DETECTED Final   Streptococcus agalactiae NOT DETECTED NOT DETECTED Final   Streptococcus pneumoniae  NOT DETECTED NOT DETECTED Final   Streptococcus pyogenes NOT DETECTED NOT DETECTED Final   A.calcoaceticus-baumannii NOT DETECTED NOT DETECTED Final   Bacteroides fragilis NOT DETECTED NOT DETECTED Final   Enterobacterales NOT DETECTED NOT DETECTED Final   Enterobacter cloacae complex NOT DETECTED NOT DETECTED Final   Escherichia coli NOT DETECTED NOT DETECTED Final   Klebsiella aerogenes NOT DETECTED NOT DETECTED Final   Klebsiella oxytoca NOT DETECTED NOT DETECTED Final   Klebsiella pneumoniae NOT DETECTED NOT DETECTED Final   Proteus species NOT DETECTED NOT DETECTED Final   Salmonella  species NOT DETECTED NOT DETECTED Final   Serratia marcescens NOT DETECTED NOT DETECTED Final   Haemophilus influenzae NOT DETECTED NOT DETECTED Final   Neisseria meningitidis NOT DETECTED NOT DETECTED Final   Pseudomonas aeruginosa NOT DETECTED NOT DETECTED Final   Stenotrophomonas maltophilia NOT DETECTED NOT DETECTED Final   Candida albicans NOT DETECTED NOT DETECTED Final   Candida auris NOT DETECTED NOT DETECTED Final   Candida glabrata NOT DETECTED NOT DETECTED Final   Candida krusei NOT DETECTED NOT DETECTED Final   Candida parapsilosis NOT DETECTED NOT DETECTED Final   Candida tropicalis NOT DETECTED NOT DETECTED Final   Cryptococcus neoformans/gattii NOT DETECTED NOT DETECTED Final    Comment: Performed at [facility], [place] [address], [place], [place] [address]  Blood culture (routine x 2)     Status: None   Collection Time: 12/11/21  3:27 PM   Specimen: BLOOD  Result Value Ref Range Status   Specimen Description BLOOD LEFT ANTECUBITAL  Final   Special Requests   Final    BOTTLES DRAWN AEROBIC AND ANAEROBIC Blood Culture adequate volume   Culture   Final    NO GROWTH 5 DAYS Performed at [facility], [place] [address] [place], [place] [address]    Report Status 12/16/2021 FINAL  Final       SIGNED:   Debbe Odea, MD  [facility] 12/17/2021, 3:20 PM

## 2021-12-17 NOTE — Progress Notes (Signed)
Manufacturing engineer [facility])  Notified by Transition of Care Manger of patient/family request for [facility] services at home after discharge. Chart and patient information under review by [facility] physician. Hospice eligibility pending currently.     Writer spoke with DIL, DeeAnna  to initiate education related to hospice philosophy, services and team approach to care.  DeeAnna  verbalized understanding of information given. Per discussion, plan is for discharge to home by PTAR.   Please send signed and completed DNR form home with patient/family. Patient will need prescriptions for discharge comfort medications.      DME needs have been discussed, patient currently has the following equipment in the home: oxygen, walker, W/C and 3N1.  Patient/family requests the following DME for delivery to the home:  hospital bed. [place] equipment manager has been notified and will contact DME provider to arrange delivery to the home. Home address has been verified and is correct in the chart. [name] is the family member to contact to arrange time of delivery.      Currently waiting on DME to be delivered to the home. I have added a BSC per DIL wishes.  Thank you, Clementeen Hoof, BSN, [facility] [phone]

## 2021-12-17 NOTE — Progress Notes (Signed)
DISCHARGE NOTE HOME ABDEL EFFINGER to be discharged Home per MD order. Discussed prescriptions and follow up appointments with the patient. Prescriptions given to patient; medication list explained in detail. Patient verbalized understanding.  Skin clean, dry and intact without evidence of skin break down, no evidence of skin tears noted. IV catheter discontinued intact. Site without signs and symptoms of complications. Dressing and pressure applied. Pt denies pain at the site currently. No complaints noted.  Patient free of lines, drains, and wounds.   An After Visit Summary (AVS) was printed and given to the patient. Patient escorted via wheelchair, and discharged home via private auto.  Berneta Levins, RN

## 2021-12-17 NOTE — Care Management (Signed)
12-17-21 [place] with [facility] will secure chat the staff once the hospital bed has been delivered to the patient. Case Manager spoke with the patient and DeeAnna regarding transportation home. Both in agreement to travel home via ambulance. Family is aware that it may be a late arrival to home. CSW assisting in scheduling arrangements for PTAR for home.

## 2021-12-17 NOTE — Progress Notes (Signed)
Family and patient decided to leave without the DNR form signed for discharge.  Have a DNR form at home.  Let them know it is to cover transportation.

## 2021-12-17 NOTE — Progress Notes (Signed)
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 Presents for shortness of breath on mild exertion such as walking across his room.  He stated that he had aspiration pneumonia last month and was treated with 7 days of Augmentin. In the ED oxygen saturation was is in the 70s CTA revealed extensive patchy air bronchograms bilaterally new from 12/9 suggestive of progressive, recurrent multilobar pneumonia-underlying metastatic disease difficult to exclude -Chronic radiation fibrosis Patient was started on ceftriaxone and azithromycin in the ED.  Subjective: He has no new complaints. Feels about the same.   Assessment & Plan:   Principal Problem:   PNA (pneumonia)-community-acquired History of pulmonary fibrosis and COPD Acute on chronic respiratory failure -Antibiotics were started on 1/15 for CAP - I have consulted PCCM for guidance. Have been recommended to do a short course of antibiotics (7 days recommended upon my discussion with Dr Valeta Harms on 1/19) and consult palliative care due to his poor overall prognosis- I feel he may be a candidate for hospice at home - patient and family have opted for hospice at home - dc antibiotics after today's dose- awaiting hospital bed  Active Problems: Chronic combined systolic and diastolic heart failure - Echo in 04/21/2020 revealed an EF of 30 to 35% and indeterminate LV diastolic parameters -Repeat echo on 04/12/2021 revealed an EF of 50 to 55% and grade 1 diastolic dysfunction - continue oral Lasix, metoprolol  BPH - Continue Flomax  Macrocytic  anemia      DVT prophylaxis: Lovenox Code Status: DO NOT RESUSCITATE Level of Care: Level of care: Telemetry Medical Disposition Plan:  Status is: Inpatient  Remains inpatient appropriate because:waiting on bed Consultants:  Pulmonary Palliative   Antimicrobials:  Anti-infectives (From admission, onward)    Start     Dose/Rate Route Frequency Ordered Stop   12/12/21 1000  cefTRIAXone (ROCEPHIN) 2 g in sodium chloride 0.9 % 100 mL IVPB        2 g 200 mL/hr over 30 Minutes Intravenous Every 24 hours 12/11/21 1405 12/16/21 1112   12/12/21 1000  azithromycin (ZITHROMAX) 500 mg in sodium chloride 0.9 % 250 mL IVPB        500 mg 250 mL/hr over 60 Minutes Intravenous Every 24 hours 12/11/21 1405 12/16/21 1246   12/11/21 1330  cefTRIAXone (ROCEPHIN) 1 g in sodium chloride 0.9 % 100 mL IVPB        1 g 200 mL/hr over 30 Minutes Intravenous  Once 12/11/21 1329 12/11/21 1626   12/11/21 1330  azithromycin (ZITHROMAX) 500 mg in sodium chloride 0.9 % 250 mL IVPB        500 mg 250 mL/hr over 60 Minutes Intravenous  Once 12/11/21 1329 12/11/21 1703        Objective: Vitals:   12/16/21 2038 12/17/21 0513 12/17/21 0903 12/17/21 0905  BP: 121/69 136/69 (!) 107/57   Pulse: 94 93 97 96  Resp: 20 20 18    Temp: 98.2 F (36.8 C) 98.4 F (36.9 C)  98 F (36.7 C)  TempSrc: Oral Oral  Oral  SpO2: 94% 94% (!) 88% 91%  Weight:      Height:        Intake/Output Summary (Last 24 hours) at 12/17/2021 1236 Last data filed at 12/17/2021 0920 Gross per 24 hour  Intake 417 ml  Output 450 ml  Net -33 ml    Filed Weights   12/11/21 0824  Weight: 72.1 kg    Examination: General exam: Appears comfortable  HEENT: PERRLA, oral mucosa moist, no sclera icterus or thrush Respiratory system: Clear to auscultation. Respiratory effort normal. Cardiovascular system: S1 & S2 heard, regular rate and rhythm Gastrointestinal system: Abdomen soft, non-tender, nondistended. Normal bowel sounds   Central  nervous system: Alert and oriented.   Extremities: No cyanosis, clubbing or edema Skin: No rashes or ulcers Psychiatry:  Mood & affect appropriate.       Data Reviewed: I have personally reviewed following labs and imaging studies  CBC: Recent Labs  Lab 12/11/21 0832 12/12/21 0413 12/13/21 0135 12/14/21 0302 12/15/21 0219  WBC 11.6* 7.6 5.7 5.5 5.4  NEUTROABS 10.6*  --  4.2 4.0 3.8  HGB 10.9* 9.0* 8.8* 9.2* 9.1*  HCT 32.7* 28.9* 28.1* 27.9* 28.8*  MCV 121.1* 125.1* 125.4* 120.8* 122.6*  PLT 238 199 199 213 427    Basic Metabolic Panel: Recent Labs  Lab 12/12/21 0413 12/13/21 0135 12/14/21 0302 12/15/21 0219 12/16/21 0403  NA 142 142 141 141 143  K 3.8 3.9 3.6 3.7 3.6  CL 111 108 107 109 109  CO2 24 23 26 25 26   GLUCOSE 130* 98 87 96 103*  BUN 23 22 18 15 14   CREATININE 1.20 1.14 0.90 0.99 0.86  CALCIUM 8.3* 8.2* 8.1* 8.0* 8.4*    GFR: Estimated Creatinine Clearance: 66.3 mL/min (by C-G formula based on SCr of 0.86 mg/dL). Liver Function Tests: Recent Labs  Lab 12/11/21 0832  AST 23  ALT 18  ALKPHOS 89  BILITOT 1.1  PROT 6.1*  ALBUMIN 3.2*    No results for input(s): LIPASE, AMYLASE in the last 168 hours. No results for input(s): AMMONIA in the last 168 hours. Coagulation Profile: No results for input(s): INR, PROTIME in the last 168 hours. Cardiac Enzymes: No results for input(s): CKTOTAL, CKMB, CKMBINDEX, TROPONINI in the last 168 hours. BNP (last 3 results) No results for input(s): PROBNP in the last 8760 hours. HbA1C: No results for input(s): HGBA1C in the last 72 hours. CBG: No results for input(s): GLUCAP in the last 168 hours. Lipid Profile: No results for input(s): CHOL, HDL, LDLCALC, TRIG, CHOLHDL, LDLDIRECT in the last 72 hours. Thyroid Function Tests: No results for input(s): TSH, T4TOTAL, FREET4, T3FREE, THYROIDAB in the last 72 hours. Anemia Panel: No results for input(s): VITAMINB12, FOLATE, FERRITIN, TIBC, IRON, RETICCTPCT in the  last 72 hours. Urine analysis:    Component Value Date/Time   COLORURINE YELLOW 03/24/2020 2219   APPEARANCEUR CLEAR 03/24/2020 2219   LABSPEC 1.014 03/24/2020 2219   PHURINE 6.0 03/24/2020 2219   GLUCOSEU NEGATIVE 03/24/2020 2219   HGBUR NEGATIVE 03/24/2020 2219   BILIRUBINUR NEGATIVE 03/24/2020 2219   KETONESUR NEGATIVE 03/24/2020 2219   PROTEINUR NEGATIVE 03/24/2020 2219   UROBILINOGEN 1.0 04/04/2013 1331   NITRITE NEGATIVE 03/24/2020 2219   LEUKOCYTESUR NEGATIVE 03/24/2020 2219   Sepsis Labs: @LABRCNTIP (procalcitonin:4,lacticidven:4) ) Recent Results (from the past 240 hour(s))  Resp Panel by RT-PCR (Flu A&B, Covid) Nasopharyngeal Swab     Status: None   Collection Time: 12/11/21  8:59 AM   Specimen: Nasopharyngeal Swab; Nasopharyngeal(NP) swabs in vial transport medium  Result Value  Ref Range Status   SARS Coronavirus 2 by RT PCR NEGATIVE NEGATIVE Final    Comment: (NOTE) SARS-CoV-2 target nucleic acids are NOT DETECTED.  The SARS-CoV-2 RNA is generally detectable in upper respiratory specimens during the acute phase of infection. The lowest concentration of SARS-CoV-2 viral copies this assay can detect is 138 copies/mL. A negative result does not preclude SARS-Cov-2 infection and should not be used as the sole basis for treatment or other patient management decisions. A negative result may occur with  improper specimen collection/handling, submission of specimen other than nasopharyngeal swab, presence of viral mutation(s) within the areas targeted by this assay, and inadequate number of viral copies(<138 copies/mL). A negative result must be combined with clinical observations, patient history, and epidemiological information. The expected result is Negative.  Fact Sheet for Patients:  EntrepreneurPulse.com.au  Fact Sheet for Healthcare Providers:  IncredibleEmployment.be  This test is no t yet approved or cleared by the [place] FDA and  has been authorized for detection and/or diagnosis of SARS-CoV-2 by FDA under an Emergency Use Authorization (EUA). This EUA will remain  in effect (meaning this test can be used) for the duration of the COVID-19 declaration under Section 564(b)(1) of the Act, 21 U.S.C.section 360bbb-3(b)(1), unless the authorization is terminated  or revoked sooner.       Influenza A by PCR NEGATIVE NEGATIVE Final   Influenza B by PCR NEGATIVE NEGATIVE Final    Comment: (NOTE) The Xpert Xpress SARS-CoV-2/FLU/RSV plus assay is intended as an aid in the diagnosis of influenza from Nasopharyngeal swab specimens and should not be used as a sole basis for treatment. Nasal washings and aspirates are unacceptable for Xpert Xpress SARS-CoV-2/FLU/RSV testing.  Fact Sheet for Patients: EntrepreneurPulse.com.au  Fact Sheet for Healthcare Providers: IncredibleEmployment.be  This test is not yet approved or cleared by the [place] FDA and has been authorized for detection and/or diagnosis of SARS-CoV-2 by FDA under an Emergency Use Authorization (EUA). This EUA will remain in effect (meaning this test can be used) for the duration of the COVID-19 declaration under Section 564(b)(1) of the Act, 21 U.S.C. section 360bbb-3(b)(1), unless the authorization is terminated or revoked.  Performed at [facility], [place] [address] [place], [place] [address]   Blood culture (routine x 2)     Status: Abnormal   Collection Time: 12/11/21  3:26 PM   Specimen: BLOOD LEFT HAND  Result Value Ref Range Status   Specimen Description BLOOD LEFT HAND  Final   Special Requests   Final    BOTTLES DRAWN AEROBIC AND ANAEROBIC Blood Culture adequate volume   Culture  Setup Time   Final    GRAM POSITIVE COCCI IN BOTH AEROBIC AND ANAEROBIC BOTTLES CRITICAL RESULT CALLED TO, READ BACK BY AND VERIFIED WITH: PHARMD GREG ABBOTT 12/12/21 @2321  BY JW    Culture (A)  Final     STAPHYLOCOCCUS CAPITIS THE SIGNIFICANCE OF ISOLATING THIS ORGANISM FROM A SINGLE SET OF BLOOD CULTURES WHEN MULTIPLE SETS ARE DRAWN IS UNCERTAIN. PLEASE NOTIFY THE MICROBIOLOGY DEPARTMENT WITHIN ONE WEEK IF SPECIATION AND SENSITIVITIES ARE REQUIRED. Performed at [facility], [place] [address], [place], [place] [address]    Report Status 12/14/2021 FINAL  Final  Blood Culture ID Panel (Reflexed)     Status: Abnormal   Collection Time: 12/11/21  3:26 PM  Result Value Ref Range Status   Enterococcus faecalis NOT DETECTED NOT DETECTED Final   Enterococcus Faecium NOT DETECTED NOT DETECTED Final  Listeria monocytogenes NOT DETECTED NOT DETECTED Final   Staphylococcus species DETECTED (A) NOT DETECTED Final    Comment: CRITICAL RESULT CALLED TO, READ BACK BY AND VERIFIED WITH: PHARMD GREG ABBOTT 12/12/21 @2321  BY JW    Staphylococcus aureus (BCID) NOT DETECTED NOT DETECTED Final   Staphylococcus epidermidis NOT DETECTED NOT DETECTED Final   Staphylococcus lugdunensis NOT DETECTED NOT DETECTED Final   Streptococcus species NOT DETECTED NOT DETECTED Final   Streptococcus agalactiae NOT DETECTED NOT DETECTED Final   Streptococcus pneumoniae NOT DETECTED NOT DETECTED Final   Streptococcus pyogenes NOT DETECTED NOT DETECTED Final   A.calcoaceticus-baumannii NOT DETECTED NOT DETECTED Final   Bacteroides fragilis NOT DETECTED NOT DETECTED Final   Enterobacterales NOT DETECTED NOT DETECTED Final   Enterobacter cloacae complex NOT DETECTED NOT DETECTED Final   Escherichia coli NOT DETECTED NOT DETECTED Final   Klebsiella aerogenes NOT DETECTED NOT DETECTED Final   Klebsiella oxytoca NOT DETECTED NOT DETECTED Final   Klebsiella pneumoniae NOT DETECTED NOT DETECTED Final   Proteus species NOT DETECTED NOT DETECTED Final   Salmonella species NOT DETECTED NOT DETECTED Final   Serratia marcescens NOT DETECTED NOT DETECTED Final   Haemophilus influenzae NOT DETECTED NOT DETECTED Final    Neisseria meningitidis NOT DETECTED NOT DETECTED Final   Pseudomonas aeruginosa NOT DETECTED NOT DETECTED Final   Stenotrophomonas maltophilia NOT DETECTED NOT DETECTED Final   Candida albicans NOT DETECTED NOT DETECTED Final   Candida auris NOT DETECTED NOT DETECTED Final   Candida glabrata NOT DETECTED NOT DETECTED Final   Candida krusei NOT DETECTED NOT DETECTED Final   Candida parapsilosis NOT DETECTED NOT DETECTED Final   Candida tropicalis NOT DETECTED NOT DETECTED Final   Cryptococcus neoformans/gattii NOT DETECTED NOT DETECTED Final    Comment: Performed at [facility], [address] [place], [place] [address]  Blood culture (routine x 2)     Status: None   Collection Time: 12/11/21  3:27 PM   Specimen: BLOOD  Result Value Ref Range Status   Specimen Description BLOOD LEFT ANTECUBITAL  Final   Special Requests   Final    BOTTLES DRAWN AEROBIC AND ANAEROBIC Blood Culture adequate volume   Culture   Final    NO GROWTH 5 DAYS Performed at [facility], [place] [address] [place], [place] [address]    Report Status 12/16/2021 FINAL  Final         Radiology Studies: No results found.    Scheduled Meds:  aspirin  81 mg Oral Daily   atorvastatin  40 mg Oral Daily   citalopram  20 mg Oral Daily   enoxaparin (LOVENOX) injection  40 mg Subcutaneous Q24H   feeding supplement  237 mL Oral BID BM   fluticasone furoate-vilanterol  1 puff Inhalation Daily   furosemide  20 mg Oral Daily   metoprolol succinate  25 mg Oral Daily   multivitamin with minerals  1 tablet Oral Daily   pantoprazole  40 mg Oral BID   polycarbophil  625 mg Oral BID   tamsulosin  0.4 mg Oral QPC supper   umeclidinium bromide  1 puff Inhalation Daily   Continuous Infusions:     LOS: 6 days      Debbe Odea, MD [facility] Pager: [email or web address] 12/17/2021, 12:36 PM

## 2021-12-19 ENCOUNTER — Encounter: Payer: Self-pay | Admitting: Vascular Surgery

## 2021-12-19 ENCOUNTER — Telehealth: Payer: Self-pay | Admitting: Medical Oncology

## 2021-12-19 NOTE — Telephone Encounter (Signed)
Daughter in law, Tilda Burrow, said pt is now on hospice. Please cancel all appts .

## 2022-01-23 ENCOUNTER — Encounter: Payer: Self-pay | Admitting: Internal Medicine

## 2022-01-25 DEATH — deceased

## 2022-02-02 ENCOUNTER — Other Ambulatory Visit: Payer: Medicare HMO

## 2022-02-06 ENCOUNTER — Ambulatory Visit: Payer: Medicare HMO | Admitting: Internal Medicine
